# Patient Record
Sex: Female | Born: 1950 | Race: Black or African American | Hispanic: No | Marital: Single | Smoking: Former smoker
Health system: Southern US, Community
[De-identification: ages and names within clinical notes are randomized; demographics above are authoritative.]

## PROBLEM LIST (undated history)

## (undated) DIAGNOSIS — C801 Malignant (primary) neoplasm, unspecified: Secondary | ICD-10-CM

## (undated) DIAGNOSIS — I739 Peripheral vascular disease, unspecified: Secondary | ICD-10-CM

## (undated) DIAGNOSIS — B1920 Unspecified viral hepatitis C without hepatic coma: Secondary | ICD-10-CM

## (undated) DIAGNOSIS — F1910 Other psychoactive substance abuse, uncomplicated: Secondary | ICD-10-CM

## (undated) DIAGNOSIS — A159 Respiratory tuberculosis unspecified: Secondary | ICD-10-CM

## (undated) DIAGNOSIS — Z21 Asymptomatic human immunodeficiency virus [HIV] infection status: Secondary | ICD-10-CM

## (undated) DIAGNOSIS — B20 Human immunodeficiency virus [HIV] disease: Secondary | ICD-10-CM

## (undated) DIAGNOSIS — T783XXA Angioneurotic edema, initial encounter: Secondary | ICD-10-CM

## (undated) DIAGNOSIS — K7460 Unspecified cirrhosis of liver: Secondary | ICD-10-CM

## (undated) DIAGNOSIS — C50919 Malignant neoplasm of unspecified site of unspecified female breast: Secondary | ICD-10-CM

## (undated) HISTORY — DX: Angioneurotic edema, initial encounter: T78.3XXA

## (undated) HISTORY — DX: Malignant neoplasm of unspecified site of unspecified female breast: C50.919

## (undated) HISTORY — DX: Peripheral vascular disease, unspecified: I73.9

---

## 1998-04-22 ENCOUNTER — Emergency Department (HOSPITAL_COMMUNITY): Admission: EM | Admit: 1998-04-22 | Discharge: 1998-04-22 | Payer: Self-pay | Admitting: Emergency Medicine

## 1998-10-24 ENCOUNTER — Emergency Department (HOSPITAL_COMMUNITY): Admission: EM | Admit: 1998-10-24 | Discharge: 1998-10-24 | Payer: Self-pay

## 2000-01-19 ENCOUNTER — Emergency Department (HOSPITAL_COMMUNITY): Admission: EM | Admit: 2000-01-19 | Discharge: 2000-01-19 | Payer: Self-pay | Admitting: *Deleted

## 2000-06-27 ENCOUNTER — Encounter: Payer: Self-pay | Admitting: Emergency Medicine

## 2000-06-27 ENCOUNTER — Emergency Department (HOSPITAL_COMMUNITY): Admission: EM | Admit: 2000-06-27 | Discharge: 2000-06-27 | Payer: Self-pay | Admitting: Emergency Medicine

## 2000-07-24 ENCOUNTER — Encounter: Admission: RE | Admit: 2000-07-24 | Discharge: 2000-08-22 | Payer: Self-pay | Admitting: *Deleted

## 2001-12-22 ENCOUNTER — Emergency Department (HOSPITAL_COMMUNITY): Admission: EM | Admit: 2001-12-22 | Discharge: 2001-12-22 | Payer: Self-pay | Admitting: Emergency Medicine

## 2001-12-22 ENCOUNTER — Encounter: Payer: Self-pay | Admitting: Emergency Medicine

## 2002-03-19 ENCOUNTER — Encounter: Payer: Self-pay | Admitting: *Deleted

## 2002-03-19 ENCOUNTER — Emergency Department (HOSPITAL_COMMUNITY): Admission: EM | Admit: 2002-03-19 | Discharge: 2002-03-19 | Payer: Self-pay | Admitting: Emergency Medicine

## 2002-10-07 ENCOUNTER — Emergency Department (HOSPITAL_COMMUNITY): Admission: EM | Admit: 2002-10-07 | Discharge: 2002-10-08 | Payer: Self-pay

## 2002-10-08 ENCOUNTER — Encounter: Payer: Self-pay | Admitting: Emergency Medicine

## 2003-06-18 ENCOUNTER — Emergency Department (HOSPITAL_COMMUNITY): Admission: AD | Admit: 2003-06-18 | Discharge: 2003-06-18 | Payer: Self-pay | Admitting: Family Medicine

## 2003-10-08 ENCOUNTER — Emergency Department (HOSPITAL_COMMUNITY): Admission: EM | Admit: 2003-10-08 | Discharge: 2003-10-08 | Payer: Self-pay

## 2003-10-11 ENCOUNTER — Emergency Department (HOSPITAL_COMMUNITY): Admission: EM | Admit: 2003-10-11 | Discharge: 2003-10-11 | Payer: Self-pay | Admitting: Emergency Medicine

## 2003-10-15 ENCOUNTER — Ambulatory Visit (HOSPITAL_COMMUNITY): Admission: RE | Admit: 2003-10-15 | Discharge: 2003-10-15 | Payer: Self-pay | Admitting: Surgery

## 2004-08-18 ENCOUNTER — Emergency Department (HOSPITAL_COMMUNITY): Admission: EM | Admit: 2004-08-18 | Discharge: 2004-08-18 | Payer: Self-pay | Admitting: Emergency Medicine

## 2006-06-13 ENCOUNTER — Emergency Department (HOSPITAL_COMMUNITY): Admission: EM | Admit: 2006-06-13 | Discharge: 2006-06-13 | Payer: Self-pay | Admitting: Emergency Medicine

## 2006-06-21 ENCOUNTER — Emergency Department (HOSPITAL_COMMUNITY): Admission: EM | Admit: 2006-06-21 | Discharge: 2006-06-21 | Payer: Self-pay | Admitting: Emergency Medicine

## 2006-09-03 ENCOUNTER — Emergency Department (HOSPITAL_COMMUNITY): Admission: EM | Admit: 2006-09-03 | Discharge: 2006-09-03 | Payer: Self-pay | Admitting: Emergency Medicine

## 2006-10-03 ENCOUNTER — Emergency Department (HOSPITAL_COMMUNITY): Admission: EM | Admit: 2006-10-03 | Discharge: 2006-10-03 | Payer: Self-pay | Admitting: Emergency Medicine

## 2007-03-28 ENCOUNTER — Emergency Department (HOSPITAL_COMMUNITY): Admission: EM | Admit: 2007-03-28 | Discharge: 2007-03-29 | Payer: Self-pay | Admitting: Emergency Medicine

## 2007-07-08 ENCOUNTER — Emergency Department (HOSPITAL_COMMUNITY): Admission: EM | Admit: 2007-07-08 | Discharge: 2007-07-08 | Payer: Self-pay | Admitting: Family Medicine

## 2008-01-01 ENCOUNTER — Encounter: Admission: RE | Admit: 2008-01-01 | Discharge: 2008-01-01 | Payer: Self-pay | Admitting: Family Medicine

## 2008-12-13 ENCOUNTER — Encounter (INDEPENDENT_AMBULATORY_CARE_PROVIDER_SITE_OTHER): Payer: Self-pay | Admitting: Emergency Medicine

## 2008-12-13 ENCOUNTER — Emergency Department (HOSPITAL_COMMUNITY): Admission: EM | Admit: 2008-12-13 | Discharge: 2008-12-13 | Payer: Self-pay | Admitting: Emergency Medicine

## 2008-12-13 ENCOUNTER — Ambulatory Visit: Payer: Self-pay | Admitting: *Deleted

## 2009-02-06 ENCOUNTER — Emergency Department (HOSPITAL_COMMUNITY): Admission: EM | Admit: 2009-02-06 | Discharge: 2009-02-07 | Payer: Self-pay | Admitting: Emergency Medicine

## 2009-04-12 ENCOUNTER — Emergency Department (HOSPITAL_COMMUNITY): Admission: EM | Admit: 2009-04-12 | Discharge: 2009-04-12 | Payer: Self-pay | Admitting: Emergency Medicine

## 2010-09-29 LAB — URINE CULTURE: Colony Count: 50000

## 2010-09-29 LAB — URINALYSIS, ROUTINE W REFLEX MICROSCOPIC
Bilirubin Urine: NEGATIVE
Glucose, UA: NEGATIVE mg/dL
Hgb urine dipstick: NEGATIVE
Ketones, ur: NEGATIVE mg/dL
Nitrite: NEGATIVE
Specific Gravity, Urine: 1.017 (ref 1.005–1.030)
pH: 8 (ref 5.0–8.0)

## 2010-09-29 LAB — COMPREHENSIVE METABOLIC PANEL
BUN: 5 mg/dL — ABNORMAL LOW (ref 6–23)
CO2: 28 mEq/L (ref 19–32)
Chloride: 104 mEq/L (ref 96–112)
Creatinine, Ser: 0.58 mg/dL (ref 0.4–1.2)
GFR calc Af Amer: >60 mL/min (ref >60)
GFR calc non Af Amer: >60 mL/min (ref >60)
Glucose, Bld: 102 mg/dL — ABNORMAL HIGH (ref 70–99)
Potassium: 4.6 mEq/L (ref 3.5–5.1)
Total Bilirubin: 1.3 mg/dL — ABNORMAL HIGH (ref 0.3–1.2)

## 2010-09-29 LAB — DIFFERENTIAL
Eosinophils Relative: 0 % (ref 0–5)
Lymphocytes Relative: 24 % (ref 12–46)
Lymphs Abs: 1.1 10*3/uL (ref 0.7–4.0)
Monocytes Absolute: 0.6 10*3/uL (ref 0.1–1.0)
Monocytes Relative: 13 % — ABNORMAL HIGH (ref 3–12)
Neutro Abs: 2.8 10*3/uL (ref 1.7–7.7)
Neutrophils Relative %: 63 % (ref 43–77)

## 2010-09-29 LAB — CBC
MCHC: 34.3 g/dL (ref 30.0–36.0)
RBC: 4.22 MIL/uL (ref 3.87–5.11)
RDW: 13.6 % (ref 11.5–15.5)

## 2010-10-03 LAB — CBC
HCT: 42.6 % (ref 36.0–46.0)
Hemoglobin: 14.4 g/dL (ref 12.0–15.0)
MCHC: 33.9 g/dL (ref 30.0–36.0)
MCV: 99.2 fL (ref 78.0–100.0)
Platelets: 81 10*3/uL — ABNORMAL LOW (ref 150–400)
RBC: 4.3 MIL/uL (ref 3.87–5.11)
RDW: 13.2 % (ref 11.5–15.5)
WBC: 3.9 10*3/uL — ABNORMAL LOW (ref 4.0–10.5)

## 2010-10-03 LAB — DIFFERENTIAL
Basophils Absolute: 0 10*3/uL (ref 0.0–0.1)
Basophils Relative: 0 % (ref 0–1)
Eosinophils Absolute: 0 10*3/uL (ref 0.0–0.7)
Eosinophils Relative: 0 % (ref 0–5)
Lymphocytes Relative: 36 % (ref 12–46)
Lymphs Abs: 1.4 K/uL (ref 0.7–4.0)
Monocytes Absolute: 0.5 10*3/uL (ref 0.1–1.0)
Monocytes Relative: 12 % (ref 3–12)
Neutro Abs: 2 10*3/uL (ref 1.7–7.7)
Neutrophils Relative %: 51 % (ref 43–77)

## 2010-10-03 LAB — BASIC METABOLIC PANEL WITH GFR
CO2: 31 meq/L (ref 19–32)
Calcium: 8.9 mg/dL (ref 8.4–10.5)
Chloride: 104 meq/L (ref 96–112)
GFR calc Af Amer: >60 mL/min (ref >60)
GFR calc non Af Amer: >60 mL/min (ref >60)
Potassium: 4.5 meq/L (ref 3.5–5.1)
Sodium: 137 meq/L (ref 135–145)

## 2010-10-03 LAB — BASIC METABOLIC PANEL
BUN: 9 mg/dL (ref 6–23)
Creatinine, Ser: 0.68 mg/dL (ref 0.4–1.2)
Glucose, Bld: 100 mg/dL — ABNORMAL HIGH (ref 70–99)

## 2011-04-06 LAB — I-STAT 8, (EC8 V) (CONVERTED LAB)
Acid-Base Excess: 5 — ABNORMAL HIGH
Chloride: 102
Glucose, Bld: 140 — ABNORMAL HIGH
HCT: 50 — ABNORMAL HIGH
Hemoglobin: 17 — ABNORMAL HIGH
Operator id: 277751
pH, Ven: 7.405 — ABNORMAL HIGH

## 2011-04-06 LAB — HEPATIC FUNCTION PANEL
Albumin: 3.5
Alkaline Phosphatase: 92
Bilirubin, Direct: 0.6 — ABNORMAL HIGH
Indirect Bilirubin: 1.6 — ABNORMAL HIGH

## 2011-04-06 LAB — DIFFERENTIAL
Basophils Absolute: 0
Basophils Relative: 0
Lymphocytes Relative: 23
Monocytes Absolute: 0.9 — ABNORMAL HIGH
Neutrophils Relative %: 69

## 2011-04-06 LAB — POCT I-STAT CREATININE
Creatinine, Ser: 0.8
Operator id: 277751

## 2011-04-06 LAB — CBC
HCT: 44.7
MCV: 102.1 — ABNORMAL HIGH
RDW: 12.9
WBC: 11.4 — ABNORMAL HIGH

## 2011-04-06 LAB — CULTURE, BLOOD (ROUTINE X 2): Culture: NO GROWTH

## 2012-09-18 ENCOUNTER — Encounter (HOSPITAL_COMMUNITY): Payer: Self-pay | Admitting: Family Medicine

## 2012-09-18 ENCOUNTER — Emergency Department (HOSPITAL_COMMUNITY): Payer: Medicaid - Out of State

## 2012-09-18 ENCOUNTER — Inpatient Hospital Stay (HOSPITAL_COMMUNITY)
Admission: EM | Admit: 2012-09-18 | Discharge: 2012-10-09 | DRG: 329 | Disposition: A | Discharge status: Home / Self Care | Payer: Medicaid - Out of State | Attending: Internal Medicine | Admitting: Internal Medicine

## 2012-09-18 DIAGNOSIS — K648 Other hemorrhoids: Secondary | ICD-10-CM | POA: Diagnosis present

## 2012-09-18 DIAGNOSIS — D6959 Other secondary thrombocytopenia: Secondary | ICD-10-CM | POA: Diagnosis present

## 2012-09-18 DIAGNOSIS — K560 Paralytic ileus: Secondary | ICD-10-CM | POA: Diagnosis not present

## 2012-09-18 DIAGNOSIS — K5900 Constipation, unspecified: Secondary | ICD-10-CM | POA: Diagnosis not present

## 2012-09-18 DIAGNOSIS — R188 Other ascites: Secondary | ICD-10-CM | POA: Diagnosis present

## 2012-09-18 DIAGNOSIS — K859 Acute pancreatitis without necrosis or infection, unspecified: Secondary | ICD-10-CM | POA: Diagnosis present

## 2012-09-18 DIAGNOSIS — K6389 Other specified diseases of intestine: Secondary | ICD-10-CM | POA: Diagnosis present

## 2012-09-18 DIAGNOSIS — E46 Unspecified protein-calorie malnutrition: Secondary | ICD-10-CM | POA: Diagnosis present

## 2012-09-18 DIAGNOSIS — R161 Splenomegaly, not elsewhere classified: Secondary | ICD-10-CM | POA: Diagnosis present

## 2012-09-18 DIAGNOSIS — E871 Hypo-osmolality and hyponatremia: Secondary | ICD-10-CM | POA: Diagnosis present

## 2012-09-18 DIAGNOSIS — D62 Acute posthemorrhagic anemia: Secondary | ICD-10-CM | POA: Diagnosis not present

## 2012-09-18 DIAGNOSIS — Z6826 Body mass index (BMI) 26.0-26.9, adult: Secondary | ICD-10-CM

## 2012-09-18 DIAGNOSIS — Z5331 Laparoscopic surgical procedure converted to open procedure: Secondary | ICD-10-CM

## 2012-09-18 DIAGNOSIS — K861 Other chronic pancreatitis: Secondary | ICD-10-CM | POA: Diagnosis present

## 2012-09-18 DIAGNOSIS — B1920 Unspecified viral hepatitis C without hepatic coma: Secondary | ICD-10-CM | POA: Diagnosis present

## 2012-09-18 DIAGNOSIS — Z8719 Personal history of other diseases of the digestive system: Secondary | ICD-10-CM | POA: Diagnosis present

## 2012-09-18 DIAGNOSIS — K7460 Unspecified cirrhosis of liver: Secondary | ICD-10-CM | POA: Diagnosis present

## 2012-09-18 DIAGNOSIS — F17200 Nicotine dependence, unspecified, uncomplicated: Secondary | ICD-10-CM | POA: Diagnosis present

## 2012-09-18 DIAGNOSIS — K567 Ileus, unspecified: Secondary | ICD-10-CM | POA: Diagnosis not present

## 2012-09-18 DIAGNOSIS — D649 Anemia, unspecified: Secondary | ICD-10-CM | POA: Diagnosis present

## 2012-09-18 DIAGNOSIS — Z8611 Personal history of tuberculosis: Secondary | ICD-10-CM

## 2012-09-18 DIAGNOSIS — B20 Human immunodeficiency virus [HIV] disease: Secondary | ICD-10-CM | POA: Diagnosis present

## 2012-09-18 DIAGNOSIS — E8779 Other fluid overload: Secondary | ICD-10-CM | POA: Diagnosis present

## 2012-09-18 DIAGNOSIS — K929 Disease of digestive system, unspecified: Secondary | ICD-10-CM | POA: Diagnosis not present

## 2012-09-18 DIAGNOSIS — Z720 Tobacco use: Secondary | ICD-10-CM | POA: Diagnosis present

## 2012-09-18 DIAGNOSIS — K644 Residual hemorrhoidal skin tags: Secondary | ICD-10-CM | POA: Diagnosis present

## 2012-09-18 DIAGNOSIS — K9189 Other postprocedural complications and disorders of digestive system: Secondary | ICD-10-CM | POA: Diagnosis not present

## 2012-09-18 DIAGNOSIS — D638 Anemia in other chronic diseases classified elsewhere: Secondary | ICD-10-CM | POA: Diagnosis present

## 2012-09-18 DIAGNOSIS — C182 Malignant neoplasm of ascending colon: Principal | ICD-10-CM | POA: Diagnosis present

## 2012-09-18 DIAGNOSIS — R109 Unspecified abdominal pain: Secondary | ICD-10-CM | POA: Diagnosis present

## 2012-09-18 DIAGNOSIS — K766 Portal hypertension: Secondary | ICD-10-CM | POA: Diagnosis present

## 2012-09-18 DIAGNOSIS — Y832 Surgical operation with anastomosis, bypass or graft as the cause of abnormal reaction of the patient, or of later complication, without mention of misadventure at the time of the procedure: Secondary | ICD-10-CM | POA: Diagnosis not present

## 2012-09-18 HISTORY — DX: Unspecified viral hepatitis C without hepatic coma: B19.20

## 2012-09-18 HISTORY — DX: Asymptomatic human immunodeficiency virus (hiv) infection status: Z21

## 2012-09-18 HISTORY — DX: Human immunodeficiency virus (HIV) disease: B20

## 2012-09-18 MED ORDER — IOHEXOL 300 MG/ML  SOLN: 50.0000 mL | Freq: Once | INTRAMUSCULAR | Status: AC | PRN

## 2012-09-18 MED ORDER — MORPHINE SULFATE 4 MG/ML IJ SOLN
4.0000 mg | Freq: Once | INTRAMUSCULAR | Status: AC
  Administered 2012-09-18: 4 mg via INTRAVENOUS
  Filled 2012-09-18: qty 1

## 2012-09-18 MED ORDER — ONDANSETRON HCL 4 MG/2ML IJ SOLN
4.0000 mg | Freq: Once | INTRAMUSCULAR | Status: AC
  Administered 2012-09-18: 4 mg via INTRAVENOUS
  Filled 2012-09-18: qty 2

## 2012-09-18 MED ORDER — SODIUM CHLORIDE 0.9 % IV BOLUS (SEPSIS)
1000.0000 mL | Freq: Once | INTRAVENOUS | Status: AC
  Administered 2012-09-18: 1000 mL via INTRAVENOUS

## 2012-09-18 NOTE — ED Notes (Signed)
Patient states she has had RLQ pain radiating into right flank off and on for the past week. Reports nausea, vomiting and diarrhea. Denies dysuria. Patient reports abdominal "fullness."

## 2012-09-18 NOTE — ED Provider Notes (Signed)
History     CSN: [id number]  Arrival date & time 09/18/12  2244   First MD Initiated Contact with Patient 09/18/12 2302      Chief Complaint  Patient presents with  . Abdominal Pain    (Consider location/radiation/quality/duration/timing/severity/associated sxs/prior treatment) HPI Comments: Patient presents with right-sided abdominal pain has been intermittent for the past week. It is worse with food. It is associated with nausea, vomiting and diarrhea. She reports increased abdominal distention. No documented fevers. Denies any dysuria or hematuria. She has a history of HIV hepatitis C and does not have a doctor in the area. She states her counts are undetectable. Denies any vaginal bleeding or discharge. She states she had similar pain more than year ago with a negative workup in no diagnosis.  The history is provided by the patient.    Past Medical History  Diagnosis Date  . Hepatitis C   . HIV (human immunodeficiency virus infection)     History reviewed. No pertinent past surgical history.  Family History  Problem Relation Age of Onset  . Cancer Mother     History  Substance Use Topics  . Smoking status: Current Every Day Smoker -- 0.50 packs/day    Types: Cigarettes  . Smokeless tobacco: Not on file  . Alcohol Use: No    OB History   Grav Para Term Preterm Abortions TAB SAB Ect Mult Living                  Review of Systems  Constitutional: Positive for activity change, appetite change and fatigue. Negative for fever.  HENT: Negative for congestion and rhinorrhea.   Respiratory: Negative for cough, chest tightness and shortness of breath.   Cardiovascular: Negative for chest pain.  Gastrointestinal: Positive for nausea, vomiting, abdominal pain and diarrhea.  Genitourinary: Negative for dysuria, hematuria, vaginal bleeding and vaginal discharge.  Musculoskeletal: Negative for back pain.  Skin: Negative for rash.  Neurological: Negative for dizziness,  weakness and headaches.  A complete 10 system review of systems was obtained and all systems are negative except as noted in the HPI and PMH.    Allergies  Review of patient's allergies indicates no known allergies.  Home Medications  No current outpatient prescriptions on file.  BP 101/65  Pulse 74  Temp(Src) 98.9 F (37.2 C) (Oral)  Resp 16  Ht 5' 3.5" (1.613 m)  Wt 152 lb 1.9 oz (69 kg)  BMI 26.52 kg/m2  SpO2 96%  Physical Exam  Constitutional: She is oriented to person, place, and time. She appears well-developed and well-nourished. No distress.  HENT:  Head: Normocephalic and atraumatic.  Mouth/Throat: Oropharynx is clear and moist. No oropharyngeal exudate.  Eyes: Conjunctivae and EOM are normal. Pupils are equal, round, and reactive to light.  Neck: Normal range of motion. Neck supple.  Cardiovascular: Normal rate, regular rhythm and normal heart sounds.   No murmur heard. Pulmonary/Chest: Effort normal and breath sounds normal. No respiratory distress.  Abdominal: Soft. Bowel sounds are normal. She exhibits distension. There is tenderness. There is guarding.  Hepatomegaly with tenderness in the right upper quadrant right lower quadrant with guarding. Patient pushes my hand away with exam  Musculoskeletal: Normal range of motion. She exhibits no edema and no tenderness.  No CVA tenderness  Neurological: She is alert and oriented to person, place, and time. No cranial nerve deficit. She exhibits normal muscle tone. Coordination normal.  Skin: Skin is warm.    ED Course  Procedures (including critical  care time)  Labs Reviewed  CBC WITH DIFFERENTIAL - Abnormal; Notable for the following:    WBC 3.6 (*)    RBC 3.82 (*)    Hemoglobin 9.5 (*)    HCT 30.3 (*)    MCH 24.9 (*)    Platelets 139 (*)    All other components within normal limits  COMPREHENSIVE METABOLIC PANEL - Abnormal; Notable for the following:    Glucose, Bld 113 (*)    Albumin 2.3 (*)    All other  components within normal limits  LIPASE, BLOOD - Abnormal; Notable for the following:    Lipase 99 (*)    All other components within normal limits  LACTIC ACID, PLASMA  URINALYSIS, ROUTINE W REFLEX MICROSCOPIC  PROTIME-INR  LIPID PANEL  BASIC METABOLIC PANEL  HEPATIC FUNCTION PANEL  CBC WITH DIFFERENTIAL  T-HELPER CELLS (CD4) COUNT  HIV 1 RNA QUANT-NO REFLEX-BLD  LIPASE, BLOOD  VITAMIN B12  FOLATE  IRON AND TIBC  FERRITIN  RETICULOCYTES  TYPE AND SCREEN   US Abdomen Complete  09/19/2012  *RADIOLOGY REPORT*  Clinical Data:  Right upper quadrant pain.  LIMITED ABDOMINAL ULTRASOUND - RIGHT UPPER QUADRANT  Comparison:  CT today.  Findings:  Gallbladder:  There are no gallstones.  No sonographic Murphy's sign.  Gallbladder wall is mildly thickened at 3.7 mm, with normal below the 3 mm.  This is nonspecific in a patient with HIV and could be due to either the patient's history of hepatitis or HIV.  Common bile duct:  Upper limits of normal for age, 6.5 mm.  Mild intrahepatic biliary ductal dilation is also demonstrated on CT.  Liver:  Cirrhotic.  Small amount of perihepatic ascites.  IMPRESSION: Mild gallbladder wall thickening which is nonspecific in this cirrhotic patient.  This could also be associated with HIV cholangiography.  No gallstones or common duct stone identified. Significantly, no sonographic Murphy's sign.                    Original Report Authenticated By: Andreas Newport, M.D.    Ct Abdomen Pelvis W Contrast  09/19/2012  *RADIOLOGY REPORT*  Clinical Data: Abdominal pain.  Right lower quadrant pain.  Pain radiating to the right flank.  History of HIV and hepatitis C.  CT ABDOMEN AND PELVIS WITH CONTRAST  Technique:  Multidetector CT imaging of the abdomen and pelvis was performed following the standard protocol during bolus administration of intravenous contrast.  Contrast: OMNIPAQUE IOHEXOL 300 MG/ML  SOLN  Comparison: None.  Findings: Lung Bases: Dependent atelectasis.   Linear scarring in the right middle lobe adjacent to the heart.  Liver:  Hepatic cirrhosis.  Diffuse micronodular contour.  No focal mass lesion is identified.  Mild intrahepatic biliary ductal dilation.  Spleen:  Splenomegaly compatible with portal venous hypertension.  Gallbladder:  Grossly normal.  No calcified stones.  Common bile duct:  Normal for age.  Pancreas:  Mild prominence of pancreatic duct.  No focal mass lesions.  The pancreas appears full.  Adrenal glands:  Normal.  Kidneys:  Normal enhancement and excretion.  The ureters appear within normal limits.  Sub centimeter left inferior pole renal cyst.  Stomach:  Decompressed.  Numerous upper abdominal lymph nodes are present, which may be associated with HIV, congestive or reactive. Neoplastic nodes are a consideration.  Index node along the lesser curvature of the stomach measures 14 mm x 33 mm.  Small bowel:  No small bowel obstruction.  No definite mural thickening.  Colon:  Ileocecal valve appears normal.  The appendix is not identified.  Abnormal soft tissue is present in the right pericolic gutter.  There is circumferential mural thickening in the proximal ascending colon, with an element of obstruction likely.  This enhancing mass demonstrates several areas of low attenuation. Differential considerations include neoplasm such as primary colon cancer, lymphoma or colitis.  This is focal and restricted to the ascending colon.  There is no involvement of the transverse or descending colon.  Pelvic Genitourinary:  Grossly within normal limits.  Urinary bladder normal.  Portal venous collaterals and prominent hemorrhoidal vein.  Bones:  No AVN.  No aggressive osseous lesions.  Spondylosis.  Vasculature: Portal venous hypertension with collaterals in the upper abdomen.  Retroperitoneal collaterals are present as well. Recanalization of the periumbilical vein. Atherosclerosis of the aorta and iliac vessels.  IMPRESSION: 1.  Hepatic cirrhosis with a small  amount of ascites, splenomegaly and portal venous hypertension. 2.  Mass in the ascending colon is suspicious for neoplasm.  Focal colitis considered less likely.  In this patient with HIV, colonic lymphoma is a definite consideration.  Adjacent soft tissue stranding extending into the right pericolic gutter raises the possibility of peritoneal spread of disease. 3.  Fullness of the pancreas with prominence of pancreatic duct. Findings suggestive of pancreatitis. 4.  Abdominal lymphadenopathy is nonspecific reactive, congestive, associated with HIV, or neoplastic in this patient with colonic mass.  Critical Value/emergent results were called by telephone at the time of interpretation on 09/19/2012 at 0141 hours to Dr. Manus Gunning, Jeannett Senior, who verbally acknowledged these results. We discussed the ultrasound dated been ordered but would likely add little based on the CT findings.   Original Report Authenticated By: Andreas Newport, M.D.      1. Pancreatitis   2. Colonic mass   3. HIV (human immunodeficiency virus infection)   4. Abdominal pain   5. Anemia       MDM  History of HIV and hepatitis with undetectable viral load by her report presenting with right-sided abdominal pain that has been intermittent for the past week. Alternating with constipation and diarrhea. Associated with nausea and vomiting. No fever.  Exquisite tenderness to palpation in the right side of her abdomen worse in the right flank and right lower quadrant.  Drop in hemoglobin noted. Patient denies any blood in stool. Guaiac is negative. No previous studies for the past 4 years. CT scan shows possible colonic mass as well as pancreatitis, cirrhosis, ascites and portal venous hypertension.  Will admit for further workup including endoscopy. Colon mass could be responsible for anemia.       Glynn Octave, MD 09/19/12 [phone]

## 2012-09-19 ENCOUNTER — Emergency Department (HOSPITAL_COMMUNITY): Payer: Medicaid - Out of State

## 2012-09-19 ENCOUNTER — Encounter (HOSPITAL_COMMUNITY): Payer: Self-pay

## 2012-09-19 DIAGNOSIS — R109 Unspecified abdominal pain: Secondary | ICD-10-CM

## 2012-09-19 DIAGNOSIS — B1920 Unspecified viral hepatitis C without hepatic coma: Secondary | ICD-10-CM | POA: Diagnosis present

## 2012-09-19 DIAGNOSIS — B20 Human immunodeficiency virus [HIV] disease: Secondary | ICD-10-CM | POA: Diagnosis present

## 2012-09-19 DIAGNOSIS — D649 Anemia, unspecified: Secondary | ICD-10-CM

## 2012-09-19 DIAGNOSIS — Z8719 Personal history of other diseases of the digestive system: Secondary | ICD-10-CM | POA: Diagnosis present

## 2012-09-19 DIAGNOSIS — K859 Acute pancreatitis without necrosis or infection, unspecified: Secondary | ICD-10-CM

## 2012-09-19 DIAGNOSIS — Z720 Tobacco use: Secondary | ICD-10-CM | POA: Diagnosis present

## 2012-09-19 DIAGNOSIS — K6389 Other specified diseases of intestine: Secondary | ICD-10-CM

## 2012-09-19 LAB — COMPREHENSIVE METABOLIC PANEL
AST: 37 U/L (ref 0–37)
CO2: 25 mEq/L (ref 19–32)
Calcium: 8.5 mg/dL (ref 8.4–10.5)
Creatinine, Ser: 0.5 mg/dL (ref 0.50–1.10)
GFR calc Af Amer: >90 mL/min (ref >90)
GFR calc non Af Amer: >90 mL/min (ref >90)

## 2012-09-19 LAB — HEPATIC FUNCTION PANEL
Albumin: 2 g/dL — ABNORMAL LOW (ref 3.5–5.2)
Alkaline Phosphatase: 83 U/L (ref 39–117)
Total Bilirubin: 0.3 mg/dL (ref 0.3–1.2)

## 2012-09-19 LAB — CBC WITH DIFFERENTIAL/PLATELET
Basophils Absolute: 0 10*3/uL (ref 0.0–0.1)
Basophils Absolute: 0 10*3/uL (ref 0.0–0.1)
Basophils Relative: 0 % (ref 0–1)
Eosinophils Absolute: 0 10*3/uL (ref 0.0–0.7)
Eosinophils Absolute: 0 10*3/uL (ref 0.0–0.7)
Eosinophils Relative: 0 % (ref 0–5)
Eosinophils Relative: 0 % (ref 0–5)
HCT: 30.3 % — ABNORMAL LOW (ref 36.0–46.0)
Lymphocytes Relative: 20 % (ref 12–46)
Lymphocytes Relative: 34 % (ref 12–46)
MCH: 24.9 pg — ABNORMAL LOW (ref 26.0–34.0)
MCHC: 30.8 g/dL (ref 30.0–36.0)
MCHC: 31.4 g/dL (ref 30.0–36.0)
MCV: 79.3 fL (ref 78.0–100.0)
MCV: 79.5 fL (ref 78.0–100.0)
Monocytes Absolute: 0.4 10*3/uL (ref 0.1–1.0)
Platelets: 123 10*3/uL — ABNORMAL LOW (ref 150–400)
RDW: 15.1 % (ref 11.5–15.5)
RDW: 15.3 % (ref 11.5–15.5)
WBC: 2.7 10*3/uL — ABNORMAL LOW (ref 4.0–10.5)
WBC: 3.6 10*3/uL — ABNORMAL LOW (ref 4.0–10.5)

## 2012-09-19 LAB — URINALYSIS, ROUTINE W REFLEX MICROSCOPIC
Ketones, ur: NEGATIVE mg/dL
Leukocytes, UA: NEGATIVE
Nitrite: NEGATIVE
Protein, ur: NEGATIVE mg/dL

## 2012-09-19 LAB — IRON AND TIBC
Iron: 14 ug/dL — ABNORMAL LOW (ref 42–135)
TIBC: 342 ug/dL (ref 250–470)

## 2012-09-19 LAB — BASIC METABOLIC PANEL
CO2: 27 mEq/L (ref 19–32)
Calcium: 7.8 mg/dL — ABNORMAL LOW (ref 8.4–10.5)
Creatinine, Ser: 0.5 mg/dL (ref 0.50–1.10)
Glucose, Bld: 93 mg/dL (ref 70–99)
Sodium: 134 mEq/L — ABNORMAL LOW (ref 135–145)

## 2012-09-19 LAB — LACTIC ACID, PLASMA: Lactic Acid, Venous: 1.7 mmol/L (ref 0.5–2.2)

## 2012-09-19 LAB — T-HELPER CELLS (CD4) COUNT (NOT AT ARMC): CD4 T Cell Abs: 150 uL — ABNORMAL LOW (ref 400–2700)

## 2012-09-19 LAB — TYPE AND SCREEN

## 2012-09-19 LAB — VITAMIN B12: Vitamin B-12: 1100 pg/mL — ABNORMAL HIGH (ref 211–911)

## 2012-09-19 LAB — LIPID PANEL
HDL: 14 mg/dL — ABNORMAL LOW (ref >39)
Triglycerides: 32 mg/dL (ref <150)

## 2012-09-19 LAB — FOLATE: Folate: 15.1 ng/mL

## 2012-09-19 LAB — RETICULOCYTES
RBC.: 3.31 MIL/uL — ABNORMAL LOW (ref 3.87–5.11)
Retic Count, Absolute: 43 10*3/uL (ref 19.0–186.0)

## 2012-09-19 MED ORDER — METRONIDAZOLE 500 MG PO TABS
500.0000 mg | ORAL_TABLET | Freq: Three times a day (TID) | ORAL | Status: DC
  Administered 2012-09-19 – 2012-09-20 (×3): 500 mg via ORAL
  Filled 2012-09-19 (×6): qty 1

## 2012-09-19 MED ORDER — HYDROMORPHONE HCL PF 1 MG/ML IJ SOLN
1.0000 mg | INTRAMUSCULAR | Status: DC | PRN
  Administered 2012-09-19 (×3): 1 mg via INTRAVENOUS
  Filled 2012-09-19 (×3): qty 1

## 2012-09-19 MED ORDER — CLONAZEPAM 0.5 MG PO TABS
0.5000 mg | ORAL_TABLET | Freq: Two times a day (BID) | ORAL | Status: DC
  Administered 2012-09-19 – 2012-09-22 (×7): 0.5 mg via ORAL
  Filled 2012-09-19 (×7): qty 1

## 2012-09-19 MED ORDER — SULFAMETHOXAZOLE-TRIMETHOPRIM 400-80 MG PO TABS
1.0000 | ORAL_TABLET | Freq: Every day | ORAL | Status: DC
  Administered 2012-09-19 – 2012-10-09 (×20): 1 via ORAL
  Filled 2012-09-19 (×22): qty 1

## 2012-09-19 MED ORDER — PANTOPRAZOLE SODIUM 40 MG IV SOLR
40.0000 mg | INTRAVENOUS | Status: DC
  Administered 2012-09-19 – 2012-09-29 (×11): 40 mg via INTRAVENOUS
  Filled 2012-09-19 (×16): qty 40

## 2012-09-19 MED ORDER — ACETAMINOPHEN 650 MG RE SUPP: 650.0000 mg | Freq: Four times a day (QID) | RECTAL | Status: DC | PRN

## 2012-09-19 MED ORDER — HYDROMORPHONE HCL PF 1 MG/ML IJ SOLN
1.0000 mg | Freq: Once | INTRAMUSCULAR | Status: AC
  Administered 2012-09-19: 1 mg via INTRAVENOUS
  Filled 2012-09-19: qty 1

## 2012-09-19 MED ORDER — HYDROMORPHONE HCL PF 2 MG/ML IJ SOLN
2.0000 mg | INTRAMUSCULAR | Status: DC | PRN
  Administered 2012-09-19 – 2012-09-23 (×25): 2 mg via INTRAVENOUS
  Filled 2012-09-19 (×25): qty 1

## 2012-09-19 MED ORDER — HYDROMORPHONE HCL PF 1 MG/ML IJ SOLN
0.5000 mg | INTRAMUSCULAR | Status: DC | PRN
  Administered 2012-09-19: 0.5 mg via INTRAVENOUS
  Filled 2012-09-19: qty 1

## 2012-09-19 MED ORDER — MORPHINE SULFATE ER 15 MG PO TBCR
15.0000 mg | EXTENDED_RELEASE_TABLET | Freq: Two times a day (BID) | ORAL | Status: DC
  Administered 2012-09-19 – 2012-09-22 (×7): 15 mg via ORAL
  Filled 2012-09-19 (×7): qty 1

## 2012-09-19 MED ORDER — SODIUM CHLORIDE 0.9 % IV SOLN
INTRAVENOUS | Status: DC
  Administered 2012-09-19 – 2012-09-20 (×3): via INTRAVENOUS

## 2012-09-19 MED ORDER — DEXTROSE 5 % IV SOLN
1.0000 g | Freq: Four times a day (QID) | INTRAVENOUS | Status: DC
  Administered 2012-09-19 – 2012-09-20 (×5): 1 g via INTRAVENOUS
  Filled 2012-09-19 (×7): qty 1

## 2012-09-19 MED ORDER — MORPHINE SULFATE 15 MG PO TABS: 15.0000 mg | ORAL_TABLET | Freq: Two times a day (BID) | ORAL | Status: DC

## 2012-09-19 MED ORDER — DEXTROSE 5 % IV SOLN
1.0000 g | INTRAVENOUS | Status: DC
  Administered 2012-09-19: 1 g via INTRAVENOUS
  Filled 2012-09-19: qty 10

## 2012-09-19 MED ORDER — ACETAMINOPHEN 325 MG PO TABS: 650.0000 mg | ORAL_TABLET | Freq: Four times a day (QID) | ORAL | Status: DC | PRN

## 2012-09-19 MED ORDER — PEG 3350-KCL-NA BICARB-NACL 420 G PO SOLR
4000.0000 mL | Freq: Once | ORAL | Status: AC
  Administered 2012-09-19: 4000 mL via ORAL
  Filled 2012-09-19: qty 4000

## 2012-09-19 MED ORDER — ONDANSETRON HCL 4 MG/2ML IJ SOLN
4.0000 mg | Freq: Three times a day (TID) | INTRAMUSCULAR | Status: DC | PRN
  Administered 2012-09-19: 4 mg via INTRAVENOUS
  Filled 2012-09-19: qty 2

## 2012-09-19 MED ORDER — ONDANSETRON HCL 4 MG PO TABS: 4.0000 mg | ORAL_TABLET | Freq: Four times a day (QID) | ORAL | Status: DC | PRN

## 2012-09-19 MED ORDER — CIPROFLOXACIN IN D5W 400 MG/200ML IV SOLN
400.0000 mg | Freq: Two times a day (BID) | INTRAVENOUS | Status: DC
  Filled 2012-09-19: qty 200

## 2012-09-19 MED ORDER — ONDANSETRON HCL 4 MG/2ML IJ SOLN
4.0000 mg | Freq: Four times a day (QID) | INTRAMUSCULAR | Status: DC | PRN
  Administered 2012-09-24 – 2012-10-06 (×3): 4 mg via INTRAVENOUS
  Filled 2012-09-19 (×3): qty 2

## 2012-09-19 MED ORDER — IOHEXOL 300 MG/ML  SOLN
100.0000 mL | Freq: Once | INTRAMUSCULAR | Status: AC | PRN
  Administered 2012-09-19: 100 mL via INTRAVENOUS

## 2012-09-19 MED ORDER — ELVITEG-COBIC-EMTRICIT-TENOFDF 150-150-200-300 MG PO TABS
1.0000 | ORAL_TABLET | Freq: Every day | ORAL | Status: DC
  Administered 2012-09-19 – 2012-09-23 (×5): 1 via ORAL
  Filled 2012-09-19 (×6): qty 1

## 2012-09-19 MED ORDER — SODIUM CHLORIDE 0.9 % IV SOLN
INTRAVENOUS | Status: DC
  Administered 2012-09-19: 03:00:00 via INTRAVENOUS

## 2012-09-19 NOTE — H&P (Addendum)
[facility] History and Physical  Ruth Stephenson WUJ:[id number] DOB: 19-Dec-1950 DOA: 09/18/2012  Referring physician: Dr.Rancour. PCP: No primary provider on file.  Specialists: None.  Chief Complaint: Abdominal pain.  HPI: Ruth Stephenson is a 62 y.o. female history of HIV and hepatitis C has been experiencing right flank pain nausea and vomiting over the last 2 weeks. Her symptoms has been gradually worsening. Patient has been having diarrhea alternating with constipation for last few months. Denies any fever chills. The pain is mostly on the right flank area nonradiating. In the ER labs show that patient has elevated lipase and CT abdomen and pelvis show possibility of pancreatitis and ascending colon mass. Patient at this time is being admitted for further management. Patient states that she has been combine with her HIV medications and she just recently moved from the ER 2 months ago. Patient states that her last HIV viral load was undetectable.  Review of Systems: As presented in the history of presenting illness, rest negative.  Past Medical History  Diagnosis Date  . Hepatitis C   . HIV (human immunodeficiency virus infection)    History reviewed. No pertinent past surgical history. Social History:  reports that she has been smoking Cigarettes.  She has been smoking about 0.50 packs per day. She does not have any smokeless tobacco history on file. She reports that she does not drink alcohol or use illicit drugs. Lives at home. where does patient live-- Can do ADLs. Can patient participate in ADLs?  No Known Allergies  Family History  Problem Relation Age of Onset  . Cancer Mother       Prior to Admission medications   Medication Sig Start Date End Date Taking? Authorizing Provider  elvitegravir-cobicistat-emtricitabine-tenofovir (STRIBILD) 150-150-200-300 MG TABS Take 1 tablet by mouth daily with breakfast.   Yes Historical Provider, MD   Physical Exam: Filed  Vitals:   09/18/12 2257  BP: 111/81  Pulse: 86  Temp: 99.8 F (37.7 C)  TempSrc: Oral  SpO2: 99%     General:  Well-developed well-nourished.  Eyes: Anicteric no pallor.  ENT: No discharge from ears eyes nose and mouth.  Neck: No mass felt.  Cardiovascular: S1-S2 heard.  Respiratory: No rhonchi or crepitations.  Abdomen: Mildly distended. Nontender (patient had received pain medications). No guarding no rigidity.  Skin: No rash.  Musculoskeletal: No edema.  Psychiatric: Appears normal.  Neurologic: Alert awake oriented to time place and person. Moves all extremities.  Labs on Admission:  Basic Metabolic Panel:  Recent Labs Lab 09/18/12 2335  NA 135  K 4.2  CL 102  CO2 25  GLUCOSE 113*  BUN 8  CREATININE 0.50  CALCIUM 8.5   Liver Function Tests:  Recent Labs Lab 09/18/12 2335  AST 37  ALT 17  ALKPHOS 106  BILITOT 0.3  PROT 7.3  ALBUMIN 2.3*    Recent Labs Lab 09/18/12 2335  LIPASE 99*   No results found for this basename: AMMONIA,  in the last 168 hours CBC:  Recent Labs Lab 09/18/12 2335  WBC 3.6*  NEUTROABS 2.5  HGB 9.5*  HCT 30.3*  MCV 79.3  PLT 139*   Cardiac Enzymes: No results found for this basename: CKTOTAL, CKMB, CKMBINDEX, TROPONINI,  in the last 168 hours  BNP (last 3 results) No results found for this basename: PROBNP,  in the last 8760 hours CBG: No results found for this basename: GLUCAP,  in the last 168 hours  Radiological Exams on Admission: US Abdomen  Complete  09/19/2012  *RADIOLOGY REPORT*  Clinical Data:  Right upper quadrant pain.  LIMITED ABDOMINAL ULTRASOUND - RIGHT UPPER QUADRANT  Comparison:  CT today.  Findings:  Gallbladder:  There are no gallstones.  No sonographic Murphy's sign.  Gallbladder wall is mildly thickened at 3.7 mm, with normal below the 3 mm.  This is nonspecific in a patient with HIV and could be due to either the patient's history of hepatitis or HIV.  Common bile duct:  Upper limits of  normal for age, 6.5 mm.  Mild intrahepatic biliary ductal dilation is also demonstrated on CT.  Liver:  Cirrhotic.  Small amount of perihepatic ascites.  IMPRESSION: Mild gallbladder wall thickening which is nonspecific in this cirrhotic patient.  This could also be associated with HIV cholangiography.  No gallstones or common duct stone identified. Significantly, no sonographic Murphy's sign.                    Original Report Authenticated By: Andreas Newport, M.D.    Ct Abdomen Pelvis W Contrast  09/19/2012  *RADIOLOGY REPORT*  Clinical Data: Abdominal pain.  Right lower quadrant pain.  Pain radiating to the right flank.  History of HIV and hepatitis C.  CT ABDOMEN AND PELVIS WITH CONTRAST  Technique:  Multidetector CT imaging of the abdomen and pelvis was performed following the standard protocol during bolus administration of intravenous contrast.  Contrast: OMNIPAQUE IOHEXOL 300 MG/ML  SOLN  Comparison: None.  Findings: Lung Bases: Dependent atelectasis.  Linear scarring in the right middle lobe adjacent to the heart.  Liver:  Hepatic cirrhosis.  Diffuse micronodular contour.  No focal mass lesion is identified.  Mild intrahepatic biliary ductal dilation.  Spleen:  Splenomegaly compatible with portal venous hypertension.  Gallbladder:  Grossly normal.  No calcified stones.  Common bile duct:  Normal for age.  Pancreas:  Mild prominence of pancreatic duct.  No focal mass lesions.  The pancreas appears full.  Adrenal glands:  Normal.  Kidneys:  Normal enhancement and excretion.  The ureters appear within normal limits.  Sub centimeter left inferior pole renal cyst.  Stomach:  Decompressed.  Numerous upper abdominal lymph nodes are present, which may be associated with HIV, congestive or reactive. Neoplastic nodes are a consideration.  Index node along the lesser curvature of the stomach measures 14 mm x 33 mm.  Small bowel:  No small bowel obstruction.  No definite mural thickening.  Colon:   Ileocecal  valve appears normal.  The appendix is not identified.  Abnormal soft tissue is present in the right pericolic gutter.  There is circumferential mural thickening in the proximal ascending colon, with an element of obstruction likely.  This enhancing mass demonstrates several areas of low attenuation. Differential considerations include neoplasm such as primary colon cancer, lymphoma or colitis.  This is focal and restricted to the ascending colon.  There is no involvement of the transverse or descending colon.  Pelvic Genitourinary:  Grossly within normal limits.  Urinary bladder normal.  Portal venous collaterals and prominent hemorrhoidal vein.  Bones:  No AVN.  No aggressive osseous lesions.  Spondylosis.  Vasculature: Portal venous hypertension with collaterals in the upper abdomen.  Retroperitoneal collaterals are present as well. Recanalization of the periumbilical vein. Atherosclerosis of the aorta and iliac vessels.  IMPRESSION: 1.  Hepatic cirrhosis with a small amount of ascites, splenomegaly and portal venous hypertension. 2.  Mass in the ascending colon is suspicious for neoplasm.  Focal colitis considered less  likely.  In this patient with HIV, colonic lymphoma is a definite consideration.  Adjacent soft tissue stranding extending into the right pericolic gutter raises the possibility of peritoneal spread of disease. 3.  Fullness of the pancreas with prominence of pancreatic duct. Findings suggestive of pancreatitis. 4.  Abdominal lymphadenopathy is nonspecific reactive, congestive, associated with HIV, or neoplastic in this patient with colonic mass.  Critical Value/emergent results were called by telephone at the time of interpretation on 09/19/2012 at 0141 hours to Dr. Manus Gunning, Jeannett Senior, who verbally acknowledged these results. We discussed the ultrasound dated been ordered but would likely add little based on the CT findings.   Original Report Authenticated By: Andreas Newport, M.D.      Assessment/Plan Principal Problem:   Abdominal pain Active Problems:   Pancreatitis   Colonic mass   HIV (human immunodeficiency virus infection)   Hepatitis C   Tobacco abuse   Anemia   1. Abdominal pain could be from pancreatitis - patient's states that she rarely drinks alcohol. She has had pancreatitis last year and was admitted. At this time check lipid panel. CT does not show any gallstones. Continue clear liquid diet and gentle hydration. But her pain is mostly on the right flank area where patient's CT shows ascending colon mass. 2. Ascending colon mass - consult GI in a.m. for further management. 3. HIV - check viral load and CD4 count. Continue present medications. Will need infectious disease referral. 4. History of hepatitis C - probably has cirrhosis presently per the CAT scan. Consult GI. Will place patient on empiric antibiotics. 5. Anemia - stool for occult blood done by the ER physician was negative. Patient's last hemoglobin in the chart was more than 4 years ago which was normal. Patient states that she didn't see any obvious GI bleed or black stools. She has been taking pain relief medications over the last few days which includes salicylates. At this time recheck CBC and check anemia panel. Type and screen. Protonix IV. 6. Tobacco abuse - advised to quit smoking.    Code Status: Full code.  Family Communication: None.  Disposition Plan: Admit to inpatient.    Tyneshia Stivers N. [facility] Pager [phone].  If 7PM-7AM, please contact night-coverage [email or web address] Password [facility] 09/19/2012, 3:38 AM

## 2012-09-19 NOTE — Care Management Note (Signed)
CARE MANAGEMENT NOTE 09/19/2012  Patient:  Ruth Stephenson, Ruth Stephenson   Account Number:  [id number]  Date Initiated:  09/19/2012  Documentation initiated by:  Dmya Long  Subjective/Objective Assessment:   62 yo femlae admitted with pancreatitis. Hx of HIV. PTA pt independent     Action/Plan:   Home when stable   Anticipated DC Date:     Anticipated DC Plan:  HOME/SELF CARE      DC Planning Services  CM consult      Choice offered to / List presented to:          [facility] arranged  NA      HH agency  NA   Status of service:  In process, will continue to follow Medicare Important Message given?   (If response is "NO", the following Medicare IM given date fields will be blank) Date Medicare IM given:   Date Additional Medicare IM given:    Discharge Disposition:    Per UR Regulation:  Reviewed for med. necessity/level of care/duration of stay  If discussed at Long Length of Stay Meetings, dates discussed:    Comments:  09/19/12 1300 Eryck Negron,RN,BSN [phone]

## 2012-09-19 NOTE — Consult Note (Signed)
Reason for Consult: Ascending colon mass Referring Physician: [facility]  [name] HPI: This is a 62 year old female with a PMH of HIV and HCV who is admitted to the hospital for recurrent right lower quadrant pain.  Her pain started back a couple of weeks ago and it progressively worsened.  She was evaluated for the same type of pain last year at [facility] in [place], per her report.  A colonoscopy was attempted, but she was not able to have a complete examination.  Ultimately her pain resolved, for the most part, but now she has a severe recurrence.  A CT scan was performed and it revealed an ascending colon mass.  She has a history of HIV and she has been out of her medications for one month.  Per her report, her viral load was undetectable, but she does not know her CD4 count.  Additionally, the CT scan, revealed a micronodular liver as well as splenomegaly.  Past Medical History  Diagnosis Date  . Hepatitis C   . HIV (human immunodeficiency virus infection)     History reviewed. No pertinent past surgical history.  Family History  Problem Relation Age of Onset  . Cancer Mother     Social History:  reports that she has been smoking Cigarettes.  She has been smoking about 0.50 packs per day. She does not have any smokeless tobacco history on file. She reports that she does not drink alcohol or use illicit drugs.  Allergies: No Known Allergies  Medications:  Scheduled: . cefoTAXime (CLAFORAN) IV  1 g Intravenous Q6H  . clonazePAM  0.5 mg Oral BID  . elvitegravir-cobicistat-emtricitabine-tenofovir  1 tablet Oral Q breakfast  . metroNIDAZOLE  500 mg Oral Q8H  . morphine  15 mg Oral Q12H  . pantoprazole (PROTONIX) IV  40 mg Intravenous Q24H  . polyethylene glycol-electrolytes  4,000 mL Oral Once  . sulfamethoxazole-trimethoprim  1 tablet Oral Daily   Continuous: . sodium chloride 75 mL/hr at 09/19/12 0553    Results for orders placed during the hospital  encounter of 09/18/12 (from the past 24 hour(s))  CBC WITH DIFFERENTIAL     Status: Abnormal   Collection Time    09/18/12 11:35 PM      Result Value Range   WBC 3.6 (*) 4.0 - 10.5 K/uL   RBC 3.82 (*) 3.87 - 5.11 MIL/uL   Hemoglobin 9.5 (*) 12.0 - 15.0 g/dL   HCT 16.1 (*) 09.6 - 04.5 %   MCV 79.3  78.0 - 100.0 fL   MCH 24.9 (*) 26.0 - 34.0 pg   MCHC 31.4  30.0 - 36.0 g/dL   RDW 40.9  81.1 - 91.4 %   Platelets 139 (*) 150 - 400 K/uL   Neutrophils Relative 69  43 - 77 %   Neutro Abs 2.5  1.7 - 7.7 K/uL   Lymphocytes Relative 20  12 - 46 %   Lymphs Abs 0.7  0.7 - 4.0 K/uL   Monocytes Relative 10  3 - 12 %   Monocytes Absolute 0.4  0.1 - 1.0 K/uL   Eosinophils Relative 0  0 - 5 %   Eosinophils Absolute 0.0  0.0 - 0.7 K/uL   Basophils Relative 0  0 - 1 %   Basophils Absolute 0.0  0.0 - 0.1 K/uL  COMPREHENSIVE METABOLIC PANEL     Status: Abnormal   Collection Time    09/18/12 11:35 PM      Result  Value Range   Sodium 135  135 - 145 mEq/L   Potassium 4.2  3.5 - 5.1 mEq/L   Chloride 102  96 - 112 mEq/L   CO2 25  19 - 32 mEq/L   Glucose, Bld 113 (*) 70 - 99 mg/dL   BUN 8  6 - 23 mg/dL   Creatinine, Ser 4.09  0.50 - 1.10 mg/dL   Calcium 8.5  8.4 - 81.1 mg/dL   Total Protein 7.3  6.0 - 8.3 g/dL   Albumin 2.3 (*) 3.5 - 5.2 g/dL   AST 37  0 - 37 U/L   ALT 17  0 - 35 U/L   Alkaline Phosphatase 106  39 - 117 U/L   Total Bilirubin 0.3  0.3 - 1.2 mg/dL   GFR calc non Af Amer >90  >90 mL/min   GFR calc Af Amer >90  >90 mL/min  LIPASE, BLOOD     Status: Abnormal   Collection Time    09/18/12 11:35 PM      Result Value Range   Lipase 99 (*) 11 - 59 U/L  LACTIC ACID, PLASMA     Status: None   Collection Time    09/18/12 11:35 PM      Result Value Range   Lactic Acid, Venous 1.7  0.5 - 2.2 mmol/L  PROTIME-INR     Status: None   Collection Time    09/18/12 11:35 PM      Result Value Range   Prothrombin Time 14.1  11.6 - 15.2 seconds   INR 1.10  0.00 - 1.49  URINALYSIS, ROUTINE W  REFLEX MICROSCOPIC     Status: None   Collection Time    09/19/12 12:57 AM      Result Value Range   Color, Urine YELLOW  YELLOW   APPearance CLEAR  CLEAR   Specific Gravity, Urine 1.011  1.005 - 1.030   pH 6.0  5.0 - 8.0   Glucose, UA NEGATIVE  NEGATIVE mg/dL   Hgb urine dipstick NEGATIVE  NEGATIVE   Bilirubin Urine NEGATIVE  NEGATIVE   Ketones, ur NEGATIVE  NEGATIVE mg/dL   Protein, ur NEGATIVE  NEGATIVE mg/dL   Urobilinogen, UA 1.0  0.0 - 1.0 mg/dL   Nitrite NEGATIVE  NEGATIVE   Leukocytes, UA NEGATIVE  NEGATIVE  LIPID PANEL     Status: Abnormal   Collection Time    09/19/12  6:31 AM      Result Value Range   Cholesterol 63  0 - 200 mg/dL   Triglycerides 32  <914 mg/dL   HDL 14 (*) >78 mg/dL   Total CHOL/HDL Ratio 4.5     VLDL 6  0 - 40 mg/dL   LDL Cholesterol 43  0 - 99 mg/dL  BASIC METABOLIC PANEL     Status: Abnormal   Collection Time    09/19/12  6:31 AM      Result Value Range   Sodium 134 (*) 135 - 145 mEq/L   Potassium 3.7  3.5 - 5.1 mEq/L   Chloride 103  96 - 112 mEq/L   CO2 27  19 - 32 mEq/L   Glucose, Bld 93  70 - 99 mg/dL   BUN 6  6 - 23 mg/dL   Creatinine, Ser 2.95  0.50 - 1.10 mg/dL   Calcium 7.8 (*) 8.4 - 10.5 mg/dL   GFR calc non Af Amer >90  >90 mL/min   GFR calc Af Amer >90  >90 mL/min  HEPATIC FUNCTION PANEL     Status: Abnormal   Collection Time    09/19/12  6:31 AM      Result Value Range   Total Protein 6.6  6.0 - 8.3 g/dL   Albumin 2.0 (*) 3.5 - 5.2 g/dL   AST 26  0 - 37 U/L   ALT 13  0 - 35 U/L   Alkaline Phosphatase 83  39 - 117 U/L   Total Bilirubin 0.3  0.3 - 1.2 mg/dL   Bilirubin, Direct 0.1  0.0 - 0.3 mg/dL   Indirect Bilirubin 0.2 (*) 0.3 - 0.9 mg/dL  CBC WITH DIFFERENTIAL     Status: Abnormal   Collection Time    09/19/12  6:31 AM      Result Value Range   WBC 2.7 (*) 4.0 - 10.5 K/uL   RBC 3.31 (*) 3.87 - 5.11 MIL/uL   Hemoglobin 8.1 (*) 12.0 - 15.0 g/dL   HCT 16.1 (*) 09.6 - 04.5 %   MCV 79.5  78.0 - 100.0 fL   MCH 24.5  (*) 26.0 - 34.0 pg   MCHC 30.8  30.0 - 36.0 g/dL   RDW 40.9  81.1 - 91.4 %   Platelets 123 (*) 150 - 400 K/uL   Neutrophils Relative 53  43 - 77 %   Neutro Abs 1.4 (*) 1.7 - 7.7 K/uL   Lymphocytes Relative 34  12 - 46 %   Lymphs Abs 0.9  0.7 - 4.0 K/uL   Monocytes Relative 12  3 - 12 %   Monocytes Absolute 0.3  0.1 - 1.0 K/uL   Eosinophils Relative 0  0 - 5 %   Eosinophils Absolute 0.0  0.0 - 0.7 K/uL   Basophils Relative 0  0 - 1 %   Basophils Absolute 0.0  0.0 - 0.1 K/uL  T-HELPER CELLS (CD4) COUNT     Status: Abnormal   Collection Time    09/19/12  6:31 AM      Result Value Range   CD4 T Cell Abs 150 (*) 400 - 2700 cmm   CD4 % Helper T Cell 16 (*) 33 - 55 %  LIPASE, BLOOD     Status: Abnormal   Collection Time    09/19/12  6:31 AM      Result Value Range   Lipase 80 (*) 11 - 59 U/L  TYPE AND SCREEN     Status: None   Collection Time    09/19/12  6:31 AM      Result Value Range   ABO/RH(D) A POS     Antibody Screen NEG     Sample Expiration 09/22/2012    VITAMIN B12     Status: Abnormal   Collection Time    09/19/12  6:31 AM      Result Value Range   Vitamin B-12 1100 (*) 211 - 911 pg/mL  FOLATE     Status: None   Collection Time    09/19/12  6:31 AM      Result Value Range   Folate 15.1    IRON AND TIBC     Status: Abnormal   Collection Time    09/19/12  6:31 AM      Result Value Range   Iron 14 (*) 42 - 135 ug/dL   TIBC 782  956 - 213 ug/dL   Saturation Ratios 4 (*) 20 - 55 %   UIBC 328  125 - 400 ug/dL  FERRITIN  Status: None   Collection Time    09/19/12  6:31 AM      Result Value Range   Ferritin 10  10 - 291 ng/mL  RETICULOCYTES     Status: Abnormal   Collection Time    09/19/12  6:31 AM      Result Value Range   Retic Ct Pct 1.3  0.4 - 3.1 %   RBC. 3.31 (*) 3.87 - 5.11 MIL/uL   Retic Count, Manual 43.0  19.0 - 186.0 K/uL  ABO/RH     Status: None   Collection Time    09/19/12  6:31 AM      Result Value Range   ABO/RH(D) A POS       US  Abdomen Complete  09/19/2012  *RADIOLOGY REPORT*  Clinical Data:  Right upper quadrant pain.  LIMITED ABDOMINAL ULTRASOUND - RIGHT UPPER QUADRANT  Comparison:  CT today.  Findings:  Gallbladder:  There are no gallstones.  No sonographic Murphy's sign.  Gallbladder wall is mildly thickened at 3.7 mm, with normal below the 3 mm.  This is nonspecific in a patient with HIV and could be due to either the patient's history of hepatitis or HIV.  Common bile duct:  Upper limits of normal for age, 6.5 mm.  Mild intrahepatic biliary ductal dilation is also demonstrated on CT.  Liver:  Cirrhotic.  Small amount of perihepatic ascites.  IMPRESSION: Mild gallbladder wall thickening which is nonspecific in this cirrhotic patient.  This could also be associated with HIV cholangiography.  No gallstones or common duct stone identified. Significantly, no sonographic Murphy's sign.                    Original Report Authenticated By: Andreas Newport, M.D.    Ct Abdomen Pelvis W Contrast  09/19/2012  *RADIOLOGY REPORT*  Clinical Data: Abdominal pain.  Right lower quadrant pain.  Pain radiating to the right flank.  History of HIV and hepatitis C.  CT ABDOMEN AND PELVIS WITH CONTRAST  Technique:  Multidetector CT imaging of the abdomen and pelvis was performed following the standard protocol during bolus administration of intravenous contrast.  Contrast: OMNIPAQUE IOHEXOL 300 MG/ML  SOLN  Comparison: None.  Findings: Lung Bases: Dependent atelectasis.  Linear scarring in the right middle lobe adjacent to the heart.  Liver:  Hepatic cirrhosis.  Diffuse micronodular contour.  No focal mass lesion is identified.  Mild intrahepatic biliary ductal dilation.  Spleen:  Splenomegaly compatible with portal venous hypertension.  Gallbladder:  Grossly normal.  No calcified stones.  Common bile duct:  Normal for age.  Pancreas:  Mild prominence of pancreatic duct.  No focal mass lesions.  The pancreas appears full.  Adrenal glands:  Normal.   Kidneys:  Normal enhancement and excretion.  The ureters appear within normal limits.  Sub centimeter left inferior pole renal cyst.  Stomach:  Decompressed.  Numerous upper abdominal lymph nodes are present, which may be associated with HIV, congestive or reactive. Neoplastic nodes are a consideration.  Index node along the lesser curvature of the stomach measures 14 mm x 33 mm.  Small bowel:  No small bowel obstruction.  No definite mural thickening.  Colon:   Ileocecal valve appears normal.  The appendix is not identified.  Abnormal soft tissue is present in the right pericolic gutter.  There is circumferential mural thickening in the proximal ascending colon, with an element of obstruction likely.  This enhancing mass demonstrates several areas of low attenuation.  Differential considerations include neoplasm such as primary colon cancer, lymphoma or colitis.  This is focal and restricted to the ascending colon.  There is no involvement of the transverse or descending colon.  Pelvic Genitourinary:  Grossly within normal limits.  Urinary bladder normal.  Portal venous collaterals and prominent hemorrhoidal vein.  Bones:  No AVN.  No aggressive osseous lesions.  Spondylosis.  Vasculature: Portal venous hypertension with collaterals in the upper abdomen.  Retroperitoneal collaterals are present as well. Recanalization of the periumbilical vein. Atherosclerosis of the aorta and iliac vessels.  IMPRESSION: 1.  Hepatic cirrhosis with a small amount of ascites, splenomegaly and portal venous hypertension. 2.  Mass in the ascending colon is suspicious for neoplasm.  Focal colitis considered less likely.  In this patient with HIV, colonic lymphoma is a definite consideration.  Adjacent soft tissue stranding extending into the right pericolic gutter raises the possibility of peritoneal spread of disease. 3.  Fullness of the pancreas with prominence of pancreatic duct. Findings suggestive of pancreatitis. 4.  Abdominal  lymphadenopathy is nonspecific reactive, congestive, associated with HIV, or neoplastic in this patient with colonic mass.  Critical Value/emergent results were called by telephone at the time of interpretation on 09/19/2012 at 0141 hours to Dr. Manus Gunning, Jeannett Senior, who verbally acknowledged these results. We discussed the ultrasound dated been ordered but would likely add little based on the CT findings.   Original Report Authenticated By: Andreas Newport, M.D.     ROS:  As stated above in the HPI otherwise negative.  Blood pressure 89/59, pulse 76, temperature 98.6 F (37 C), temperature source Oral, resp. rate 16, height 5' 3.5" (1.613 m), weight 152 lb 1.9 oz (69 kg), SpO2 100.00%.    PE: Gen: NAD, Alert and Oriented, Histrionic, tearful HEENT:  [place]/AT, EOMI Neck: Supple, no LAD Lungs: CTA Bilaterally CV: RRR without M/G/R ABM: Soft, distended, no significant tympany, RLQ pain, +BS Ext: No C/C/E  Assessment/Plan: 1) Ascending colon mass. 2) HIV. 3) HCV with probable cirrhosis.   The patient requires a colonoscopy.  I do not know the nature of the mass in the ascending colon.  My only reservation with performing the procedure is that she may be obstructed.  This was suggested on the CT scan.  If she cannot tolerate the prep, then I will call Surgery to evaluate the patient.  Plan: 1) Attempt colonoscopy tomorrow.  Mystie Ormand D 09/19/2012, 4:15 PM

## 2012-09-19 NOTE — Progress Notes (Signed)
[facility]                                                                                Patient Demographics  Ruth Stephenson, is a 62 y.o. female, DOB - 04/08/1951, WUJ:[id number], [id number]:[id number]  Admit date - 09/18/2012  Admitting Physician Eduard Clos, MD  Outpatient Primary MD for the patient is No primary provider on file.  LOS - 1   Chief Complaint  Patient presents with  . Abdominal Pain        Assessment & Plan    1. Abdominal pain noted in the right lower quadrant. Patient also has history of HIV, hep C with cirrhosis, CT suggestive of ascending colon mass. She has some tenderness to the right lower quadrant, mass could be malignancy versus infectious, stool cultures, GI pathogen panel PCR, ova parasites ordered, for now Cefotaxime (ascites) and Flagyl, GI called along with ID to evaluate the patient.     2. HIV - check viral load and CD4 count. Continue present home medications. Has been out of for HIV medications for the last 1 month since she moved from [place], ID requested to see the patient     3. History of hepatitis C - probably has cirrhosis presently per the CAT scan. Consult GI. Will placed patient on empiric antibiotics as in #1.     4. Anemia - likely anemia of chronic disease plus dilutional fall from IV fluids, stool for occult blood done by the ER physician was negative. Patient's last hemoglobin in the chart was more than 4 years ago which was normal. Patient states that she didn't see any obvious GI bleed or black stools. She has been taking pain relief medications over the last few days which includes salicylates. At this time  check anemia panel. Type and screen. Protonix IV.     5. Tobacco abuse - advised to quit smoking.     6. Thrombocytopenia. Likely cirrhosis related, we will monitor, we will avoid heparin Lovenox.      Code Status: full  Family Communication: patient  Disposition Plan:  home   Procedures CT abd Pelvis, Abd [place]   Consults  Gi, ID   DVT Prophylaxis    SCDs    Lab Results  Component Value Date   PLT 123* 09/19/2012    Medications  Scheduled Meds: . elvitegravir-cobicistat-emtricitabine-tenofovir  1 tablet Oral Q breakfast  . metroNIDAZOLE  500 mg Oral Q8H  . pantoprazole (PROTONIX) IV  40 mg Intravenous Q24H   Continuous Infusions: . sodium chloride 75 mL/hr at 09/19/12 0553   PRN Meds:.acetaminophen, acetaminophen, HYDROmorphone (DILAUDID) injection, ondansetron (ZOFRAN) IV, ondansetron  Antibiotics     Anti-infectives   Start     Dose/Rate Route Frequency Ordered Stop   09/19/12 0915  metroNIDAZOLE (FLAGYL) tablet 500 mg     500 mg Oral 3 times per day 09/19/12 0906     09/19/12 0800  elvitegravir-cobicistat-emtricitabine-tenofovir (STRIBILD) 150-150-200-300 MG tablet 1 tablet     1 tablet Oral Daily with breakfast 09/19/12 0512     09/19/12 0600  cefTRIAXone (ROCEPHIN) 1 g in dextrose 5 % 50 mL IVPB  Status:  Discontinued  1 g 100 mL/hr over 30 Minutes Intravenous Every 24 hours 09/19/12 0512 09/19/12 0906       Time Spent in minutes   35   SINGH,PRASHANT K M.D on 09/19/2012 at 9:07 AM  Between 7am to 7pm - Pager - [phone]  After 7pm go to [email or web address] - password [email or web address]  And look for the night coverage person covering for me after hours  [facility] Office  [phone]    Subjective:   [name] today has, No headache, No chest pain, No abdominal pain - No Nausea, No new weakness tingling or numbness, No Cough - SOB.   Objective:   Filed Vitals:   09/18/12 2257 09/19/12 0502 09/19/12 0508  BP: 111/81 105/61 101/65  Pulse: 86 71 74  Temp: 99.8 F (37.7 C) 99.2 F (37.3 C) 98.9 F (37.2 C)  TempSrc: Oral Oral Oral  Resp:  18 16  Height:   5' 3.5" (1.613 m)  Weight:   69 kg (152 lb 1.9 oz)  SpO2: 99% 98% 96%    Wt Readings from Last 3 Encounters:  09/19/12 69 kg (152 lb 1.9 oz)      Intake/Output Summary (Last 24 hours) at 09/19/12 0907 Last data filed at 09/19/12 0759  Gross per 24 hour  Intake   1480 ml  Output      0 ml  Net   1480 ml    Exam Awake Alert, Oriented X 3, No new F.N deficits, Normal affect [place].AT,PERRAL Supple Neck,No JVD, No cervical lymphadenopathy appriciated.  Symmetrical Chest wall movement, Good air movement bilaterally, CTAB RRR,No Gallops,Rubs or new Murmurs, No Parasternal Heave +ve B.Sounds, Abd MILDLY DISTENDED AND TENDER RLQ, No organomegaly appriciated, No rebound - guarding or rigidity. No Cyanosis, Clubbing or edema, No new Rash or bruise     Data Review   Micro Results No results found for this or any previous visit (from the past 240 hour(s)).  Radiology Reports US Abdomen Complete  09/19/2012  *RADIOLOGY REPORT*  Clinical Data:  Right upper quadrant pain.  LIMITED ABDOMINAL ULTRASOUND - RIGHT UPPER QUADRANT  Comparison:  CT today.  Findings:  Gallbladder:  There are no gallstones.  No sonographic Murphy's sign.  Gallbladder wall is mildly thickened at 3.7 mm, with normal below the 3 mm.  This is nonspecific in a patient with HIV and could be due to either the patient's history of hepatitis or HIV.  Common bile duct:  Upper limits of normal for age, 6.5 mm.  Mild intrahepatic biliary ductal dilation is also demonstrated on CT.  Liver:  Cirrhotic.  Small amount of perihepatic ascites.  IMPRESSION: Mild gallbladder wall thickening which is nonspecific in this cirrhotic patient.  This could also be associated with HIV cholangiography.  No gallstones or common duct stone identified. Significantly, no sonographic Murphy's sign.                    Original Report Authenticated By: Andreas Newport, M.D.    Ct Abdomen Pelvis W Contrast  09/19/2012  *RADIOLOGY REPORT*  Clinical Data: Abdominal pain.  Right lower quadrant pain.  Pain radiating to the right flank.  History of HIV and hepatitis C.  CT ABDOMEN AND PELVIS WITH CONTRAST   Technique:  Multidetector CT imaging of the abdomen and pelvis was performed following the standard protocol during bolus administration of intravenous contrast.  Contrast: OMNIPAQUE IOHEXOL 300 MG/ML  SOLN  Comparison: None.  Findings: Lung Bases: Dependent atelectasis.  Linear scarring  in the right middle lobe adjacent to the heart.  Liver:  Hepatic cirrhosis.  Diffuse micronodular contour.  No focal mass lesion is identified.  Mild intrahepatic biliary ductal dilation.  Spleen:  Splenomegaly compatible with portal venous hypertension.  Gallbladder:  Grossly normal.  No calcified stones.  Common bile duct:  Normal for age.  Pancreas:  Mild prominence of pancreatic duct.  No focal mass lesions.  The pancreas appears full.  Adrenal glands:  Normal.  Kidneys:  Normal enhancement and excretion.  The ureters appear within normal limits.  Sub centimeter left inferior pole renal cyst.  Stomach:  Decompressed.  Numerous upper abdominal lymph nodes are present, which may be associated with HIV, congestive or reactive. Neoplastic nodes are a consideration.  Index node along the lesser curvature of the stomach measures 14 mm x 33 mm.  Small bowel:  No small bowel obstruction.  No definite mural thickening.  Colon:   Ileocecal valve appears normal.  The appendix is not identified.  Abnormal soft tissue is present in the right pericolic gutter.  There is circumferential mural thickening in the proximal ascending colon, with an element of obstruction likely.  This enhancing mass demonstrates several areas of low attenuation. Differential considerations include neoplasm such as primary colon cancer, lymphoma or colitis.  This is focal and restricted to the ascending colon.  There is no involvement of the transverse or descending colon.  Pelvic Genitourinary:  Grossly within normal limits.  Urinary bladder normal.  Portal venous collaterals and prominent hemorrhoidal vein.  Bones:  No AVN.  No aggressive osseous lesions.   Spondylosis.  Vasculature: Portal venous hypertension with collaterals in the upper abdomen.  Retroperitoneal collaterals are present as well. Recanalization of the periumbilical vein. Atherosclerosis of the aorta and iliac vessels.  IMPRESSION: 1.  Hepatic cirrhosis with a small amount of ascites, splenomegaly and portal venous hypertension. 2.  Mass in the ascending colon is suspicious for neoplasm.  Focal colitis considered less likely.  In this patient with HIV, colonic lymphoma is a definite consideration.  Adjacent soft tissue stranding extending into the right pericolic gutter raises the possibility of peritoneal spread of disease. 3.  Fullness of the pancreas with prominence of pancreatic duct. Findings suggestive of pancreatitis. 4.  Abdominal lymphadenopathy is nonspecific reactive, congestive, associated with HIV, or neoplastic in this patient with colonic mass.  Critical Value/emergent results were called by telephone at the time of interpretation on 09/19/2012 at 0141 hours to Dr. Manus Gunning, Jeannett Senior, who verbally acknowledged these results. We discussed the ultrasound dated been ordered but would likely add little based on the CT findings.   Original Report Authenticated By: Andreas Newport, M.D.     CBC  Recent Labs Lab 09/18/12 2335 09/19/12 0631  WBC 3.6* 2.7*  HGB 9.5* 8.1*  HCT 30.3* 26.3*  PLT 139* 123*  MCV 79.3 79.5  MCH 24.9* 24.5*  MCHC 31.4 30.8  RDW 15.3 15.1  LYMPHSABS 0.7 0.9  MONOABS 0.4 0.3  EOSABS 0.0 0.0  BASOSABS 0.0 0.0    Chemistries   Recent Labs Lab 09/18/12 2335 09/19/12 0631  NA 135 134*  K 4.2 3.7  CL 102 103  CO2 25 27  GLUCOSE 113* 93  BUN 8 6  CREATININE 0.50 0.50  CALCIUM 8.5 7.8*  AST 37 26  ALT 17 13  ALKPHOS 106 83  BILITOT 0.3 0.3   ------------------------------------------------------------------------------------------------------------------ estimated creatinine clearance is 70.6 ml/min (by C-G formula based on Cr of  0.5). ------------------------------------------------------------------------------------------------------------------ No results  found for this basename: HGBA1C,  in the last 72 hours ------------------------------------------------------------------------------------------------------------------  Recent Labs  09/19/12 0631  CHOL 63  HDL 14*  LDLCALC 43  TRIG 32  CHOLHDL 4.5   ------------------------------------------------------------------------------------------------------------------ No results found for this basename: TSH, T4TOTAL, FREET3, T3FREE, THYROIDAB,  in the last 72 hours ------------------------------------------------------------------------------------------------------------------  Recent Labs  09/19/12 0631  RETICCTPCT 1.3    Coagulation profile  Recent Labs Lab 09/18/12 2335  INR 1.10    No results found for this basename: DDIMER,  in the last 72 hours  Cardiac Enzymes No results found for this basename: CK, CKMB, TROPONINI, MYOGLOBIN,  in the last 168 hours ------------------------------------------------------------------------------------------------------------------ No components found with this basename: POCBNP,

## 2012-09-19 NOTE — Consult Note (Addendum)
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 Genotype 2013 RT (-), PI no significant mutations   we'll try to get in touch with her previous physician (Dr. Gaspar Skeeters (out on medical leave) ---> DrScherer, at [facility] in [place] [phone] Ruth Stephenson)  Comment: Her previous illness in 2013 seem suspicious for the beginning of her current illness (after speaking with a physician at [facility], on a CT scan at that time she had a circumferential ulcerative lesion). Hopefully we will be able to get a biopsy of her lesion. The differential this is very broad for this and includes but is not limited to MAI, lymphoma, Kaposi's sarcoma, CMV.   Thank you so much for this interesting consult,   Johny Sax [phone]  Ruth Stephenson is an 62 y.o. female.  HPI: 51 yo F with hx of HIV+ since 1988. She was hospitalized in roughly March 2013 with right-sided abdominal pain. she did have blood in her stool that time. She was hospitalized at [facility] in [place]. She was treated with antibiotics and improved. She had an upper endoscopy was negative. She states she was unable to have a lower endoscopy due to pain. She was never on antiretroviral therapy until that time when she was started on Stribilid. She moved to [place] roughly 6 weeks ago, and then ran out of her antiretroviral therapy roughly 30 days ago. She returned to the hospital 3-26 with right-sided abdominal pain. Fevers and chills, No change in her weight, Constipation, Abdominal swelling, but denies blood per rectum. Cd4 152, VL <20 (01-2012,  06-2012) Past Medical History  Diagnosis Date  . Hepatitis C   . HIV (human immunodeficiency virus infection)     History reviewed. No pertinent past surgical history.   No Known Allergies  Medications:  Scheduled: . cefoTAXime (CLAFORAN) IV  1 g Intravenous Q6H  . elvitegravir-cobicistat-emtricitabine-tenofovir  1 tablet Oral Q breakfast  . metroNIDAZOLE  500 mg Oral Q8H  . pantoprazole (PROTONIX) IV  40 mg Intravenous Q24H    Total days of antibiotics: 1   Social History:  reports that she has been smoking Cigarettes.  She has been smoking about 0.50 packs per day. She does not have any smokeless tobacco history on file. She reports that she does not drink alcohol or use illicit drugs.  Family History  Problem Relation Age of Onset  . Cancer Mother     General ROS: See history of present illness. No lymphadenopathy. Positive headaches which she attributes to being hungry. She states she's been afraid to eat. No chest pain she has had shortness of breath and dyspnea do to her increasing abdominal girth.  Blood pressure 89/59, pulse 76, temperature 98.6 F (37 C), temperature source Oral, resp. rate 16, height 5' 3.5" (1.613 m), weight 69 kg (152 lb 1.9 oz), SpO2 100.00%. General appearance: alert, cooperative and no distress Eyes: negative findings: pupils equal, round, reactive to light and accomodation Throat: normal findings: oropharynx pink & moist without lesions or evidence of thrush Neck: no adenopathy and supple, symmetrical, trachea midline Lungs: clear to auscultation bilaterally Heart: regular rate and rhythm Abdomen: normal findings: bowel sounds normal and  soft, non-tender and abnormal findings:  distended Extremities: edema 2+ left lower extremity greater than right lower extremity Lymph nodes: Cervical, supraclavicular, and axillary nodes normal.   Results for orders placed during the hospital encounter of 09/18/12 (from the past 48 hour(s))  CBC WITH  DIFFERENTIAL     Status: Abnormal   Collection Time    09/18/12 11:35 PM      Result Value Range   WBC 3.6 (*) 4.0 - 10.5 K/uL   RBC 3.82 (*) 3.87 - 5.11 MIL/uL   Hemoglobin 9.5 (*) 12.0 - 15.0 g/dL   HCT 86.5 (*) 78.4 - 69.6 %   MCV 79.3  78.0 - 100.0 fL   MCH 24.9 (*) 26.0 - 34.0 pg   MCHC 31.4  30.0 - 36.0 g/dL   RDW 29.5  28.4 - 13.2 %   Platelets 139 (*) 150 - 400 K/uL   Neutrophils Relative 69  43 - 77 %   Neutro Abs 2.5  1.7 - 7.7 K/uL   Lymphocytes Relative 20  12 - 46 %   Lymphs Abs 0.7  0.7 - 4.0 K/uL   Monocytes Relative 10  3 - 12 %   Monocytes Absolute 0.4  0.1 - 1.0 K/uL   Eosinophils Relative 0  0 - 5 %   Eosinophils Absolute 0.0  0.0 - 0.7 K/uL   Basophils Relative 0  0 - 1 %   Basophils Absolute 0.0  0.0 - 0.1 K/uL  COMPREHENSIVE METABOLIC PANEL     Status: Abnormal   Collection Time    09/18/12 11:35 PM      Result Value Range   Sodium 135  135 - 145 mEq/L   Potassium 4.2  3.5 - 5.1 mEq/L   Chloride 102  96 - 112 mEq/L   CO2 25  19 - 32 mEq/L   Glucose, Bld 113 (*) 70 - 99 mg/dL   BUN 8  6 - 23 mg/dL   Creatinine, Ser 4.40  0.50 - 1.10 mg/dL   Calcium 8.5  8.4 - 10.2 mg/dL   Total Protein 7.3  6.0 - 8.3 g/dL   Albumin 2.3 (*) 3.5 - 5.2 g/dL   AST 37  0 - 37 U/L   ALT 17  0 - 35 U/L   Alkaline Phosphatase 106  39 - 117 U/L   Total Bilirubin 0.3  0.3 - 1.2 mg/dL   GFR calc non Af Amer >90  >90 mL/min   GFR calc Af Amer >90  >90 mL/min   Comment:            The eGFR has been calculated     using the CKD EPI equation.     This calculation has not been     validated in all clinical     situations.     eGFR's persistently     <90 mL/min signify     possible Chronic Kidney Disease.  LIPASE, BLOOD     Status: Abnormal   Collection Time    09/18/12 11:35 PM      Result Value Range   Lipase 99 (*) 11 - 59 U/L  LACTIC ACID, PLASMA     Status: None   Collection Time    09/18/12 11:35 PM      Result Value Range   Lactic Acid, Venous 1.7  0.5 - 2.2  mmol/L  PROTIME-INR     Status: None   Collection Time    09/18/12 11:35 PM  Result Value Range   Prothrombin Time 14.1  11.6 - 15.2 seconds   INR 1.10  0.00 - 1.49  URINALYSIS, ROUTINE W REFLEX MICROSCOPIC     Status: None   Collection Time    09/19/12 12:57 AM      Result Value Range   Color, Urine YELLOW  YELLOW   APPearance CLEAR  CLEAR   Specific Gravity, Urine 1.011  1.005 - 1.030   pH 6.0  5.0 - 8.0   Glucose, UA NEGATIVE  NEGATIVE mg/dL   Hgb urine dipstick NEGATIVE  NEGATIVE   Bilirubin Urine NEGATIVE  NEGATIVE   Ketones, ur NEGATIVE  NEGATIVE mg/dL   Protein, ur NEGATIVE  NEGATIVE mg/dL   Urobilinogen, UA 1.0  0.0 - 1.0 mg/dL   Nitrite NEGATIVE  NEGATIVE   Leukocytes, UA NEGATIVE  NEGATIVE   Comment: MICROSCOPIC NOT DONE ON URINES WITH NEGATIVE PROTEIN, BLOOD, LEUKOCYTES, NITRITE, OR GLUCOSE <1000 mg/dL.  LIPID PANEL     Status: Abnormal   Collection Time    09/19/12  6:31 AM      Result Value Range   Cholesterol 63  0 - 200 mg/dL   Triglycerides 32  <161 mg/dL   HDL 14 (*) >09 mg/dL   Total CHOL/HDL Ratio 4.5     VLDL 6  0 - 40 mg/dL   LDL Cholesterol 43  0 - 99 mg/dL   Comment:            Total Cholesterol/HDL:CHD Risk     Coronary Heart Disease Risk Table                         Men   Women      1/2 Average Risk   3.4   3.3      Average Risk       5.0   4.4      2 X Average Risk   9.6   7.1      3 X Average Risk  23.4   11.0                Use the calculated Patient Ratio     above and the CHD Risk Table     to determine the patient's CHD Risk.                ATP III CLASSIFICATION (LDL):      <100     mg/dL   Optimal      604-540  mg/dL   Near or Above                        Optimal      130-159  mg/dL   Borderline      981-191  mg/dL   High      >478     mg/dL   Very High  BASIC METABOLIC PANEL     Status: Abnormal   Collection Time    09/19/12  6:31 AM      Result Value Range   Sodium 134 (*) 135 - 145 mEq/L   Potassium 3.7  3.5 - 5.1  mEq/L   Chloride 103  96 - 112 mEq/L   CO2 27  19 - 32 mEq/L   Glucose, Bld 93  70 - 99 mg/dL   BUN 6  6 - 23 mg/dL   Creatinine, Ser 2.95  0.50 - 1.10 mg/dL   Calcium 7.8 (*)  8.4 - 10.5 mg/dL   GFR calc non Af Amer >90  >90 mL/min   GFR calc Af Amer >90  >90 mL/min   Comment:            The eGFR has been calculated     using the CKD EPI equation.     This calculation has not been     validated in all clinical     situations.     eGFR's persistently     <90 mL/min signify     possible Chronic Kidney Disease.  HEPATIC FUNCTION PANEL     Status: Abnormal   Collection Time    09/19/12  6:31 AM      Result Value Range   Total Protein 6.6  6.0 - 8.3 g/dL   Albumin 2.0 (*) 3.5 - 5.2 g/dL   AST 26  0 - 37 U/L   ALT 13  0 - 35 U/L   Alkaline Phosphatase 83  39 - 117 U/L   Total Bilirubin 0.3  0.3 - 1.2 mg/dL   Bilirubin, Direct 0.1  0.0 - 0.3 mg/dL   Indirect Bilirubin 0.2 (*) 0.3 - 0.9 mg/dL  CBC WITH DIFFERENTIAL     Status: Abnormal   Collection Time    09/19/12  6:31 AM      Result Value Range   WBC 2.7 (*) 4.0 - 10.5 K/uL   RBC 3.31 (*) 3.87 - 5.11 MIL/uL   Hemoglobin 8.1 (*) 12.0 - 15.0 g/dL   HCT 16.1 (*) 09.6 - 04.5 %   MCV 79.5  78.0 - 100.0 fL   MCH 24.5 (*) 26.0 - 34.0 pg   MCHC 30.8  30.0 - 36.0 g/dL   RDW 40.9  81.1 - 91.4 %   Platelets 123 (*) 150 - 400 K/uL   Neutrophils Relative 53  43 - 77 %   Neutro Abs 1.4 (*) 1.7 - 7.7 K/uL   Lymphocytes Relative 34  12 - 46 %   Lymphs Abs 0.9  0.7 - 4.0 K/uL   Monocytes Relative 12  3 - 12 %   Monocytes Absolute 0.3  0.1 - 1.0 K/uL   Eosinophils Relative 0  0 - 5 %   Eosinophils Absolute 0.0  0.0 - 0.7 K/uL   Basophils Relative 0  0 - 1 %   Basophils Absolute 0.0  0.0 - 0.1 K/uL  LIPASE, BLOOD     Status: Abnormal   Collection Time    09/19/12  6:31 AM      Result Value Range   Lipase 80 (*) 11 - 59 U/L  TYPE AND SCREEN     Status: None   Collection Time    09/19/12  6:31 AM      Result Value Range    ABO/RH(D) A POS     Antibody Screen NEG     Sample Expiration 09/22/2012    VITAMIN B12     Status: Abnormal   Collection Time    09/19/12  6:31 AM      Result Value Range   Vitamin B-12 1100 (*) 211 - 911 pg/mL  FOLATE     Status: None   Collection Time    09/19/12  6:31 AM      Result Value Range   Folate 15.1     Comment: (NOTE)     Reference Ranges            Deficient:       0.4 -  3.3 ng/mL            Indeterminate:   3.4 - 5.4 ng/mL            Normal:              > 5.4 ng/mL  IRON AND TIBC     Status: Abnormal   Collection Time    09/19/12  6:31 AM      Result Value Range   Iron 14 (*) 42 - 135 ug/dL   TIBC 161  096 - 045 ug/dL   Saturation Ratios 4 (*) 20 - 55 %   UIBC 328  125 - 400 ug/dL  FERRITIN     Status: None   Collection Time    09/19/12  6:31 AM      Result Value Range   Ferritin 10  10 - 291 ng/mL  RETICULOCYTES     Status: Abnormal   Collection Time    09/19/12  6:31 AM      Result Value Range   Retic Ct Pct 1.3  0.4 - 3.1 %   RBC. 3.31 (*) 3.87 - 5.11 MIL/uL   Retic Count, Manual 43.0  19.0 - 186.0 K/uL  ABO/RH     Status: None   Collection Time    09/19/12  6:31 AM      Result Value Range   ABO/RH(D) A POS        Component Value Date/Time   SDES URINE, RANDOM 04/12/2009 0803   SPECREQUEST ADDED AT 1012 04/12/2009 0803   CULT Multiple bacterial morphotypes present, none predominant. Suggest appropriate recollection if clinically indicated. 04/12/2009 0803   REPTSTATUS 04/14/2009 FINAL 04/12/2009 0803   US Abdomen Complete  09/19/2012  *RADIOLOGY REPORT*  Clinical Data:  Right upper quadrant pain.  LIMITED ABDOMINAL ULTRASOUND - RIGHT UPPER QUADRANT  Comparison:  CT today.  Findings:  Gallbladder:  There are no gallstones.  No sonographic Murphy's sign.  Gallbladder wall is mildly thickened at 3.7 mm, with normal below the 3 mm.  This is nonspecific in a patient with HIV and could be due to either the patient's history of hepatitis or HIV.   Common bile duct:  Upper limits of normal for age, 6.5 mm.  Mild intrahepatic biliary ductal dilation is also demonstrated on CT.  Liver:  Cirrhotic.  Small amount of perihepatic ascites.  IMPRESSION: Mild gallbladder wall thickening which is nonspecific in this cirrhotic patient.  This could also be associated with HIV cholangiography.  No gallstones or common duct stone identified. Significantly, no sonographic Murphy's sign.                    Original Report Authenticated By: Andreas Newport, M.D.    Ct Abdomen Pelvis W Contrast  09/19/2012  *RADIOLOGY REPORT*  Clinical Data: Abdominal pain.  Right lower quadrant pain.  Pain radiating to the right flank.  History of HIV and hepatitis C.  CT ABDOMEN AND PELVIS WITH CONTRAST  Technique:  Multidetector CT imaging of the abdomen and pelvis was performed following the standard protocol during bolus administration of intravenous contrast.  Contrast: OMNIPAQUE IOHEXOL 300 MG/ML  SOLN  Comparison: None.  Findings: Lung Bases: Dependent atelectasis.  Linear scarring in the right middle lobe adjacent to the heart.  Liver:  Hepatic cirrhosis.  Diffuse micronodular contour.  No focal mass lesion is identified.  Mild intrahepatic biliary ductal dilation.  Spleen:  Splenomegaly compatible with portal venous hypertension.  Gallbladder:  Grossly normal.  No calcified stones.  Common bile duct:  Normal for age.  Pancreas:  Mild prominence of pancreatic duct.  No focal mass lesions.  The pancreas appears full.  Adrenal glands:  Normal.  Kidneys:  Normal enhancement and excretion.  The ureters appear within normal limits.  Sub centimeter left inferior pole renal cyst.  Stomach:  Decompressed.  Numerous upper abdominal lymph nodes are present, which may be associated with HIV, congestive or reactive. Neoplastic nodes are a consideration.  Index node along the lesser curvature of the stomach measures 14 mm x 33 mm.  Small bowel:  No small bowel obstruction.  No definite  mural thickening.  Colon:   Ileocecal valve appears normal.  The appendix is not identified.  Abnormal soft tissue is present in the right pericolic gutter.  There is circumferential mural thickening in the proximal ascending colon, with an element of obstruction likely.  This enhancing mass demonstrates several areas of low attenuation. Differential considerations include neoplasm such as primary colon cancer, lymphoma or colitis.  This is focal and restricted to the ascending colon.  There is no involvement of the transverse or descending colon.  Pelvic Genitourinary:  Grossly within normal limits.  Urinary bladder normal.  Portal venous collaterals and prominent hemorrhoidal vein.  Bones:  No AVN.  No aggressive osseous lesions.  Spondylosis.  Vasculature: Portal venous hypertension with collaterals in the upper abdomen.  Retroperitoneal collaterals are present as well. Recanalization of the periumbilical vein. Atherosclerosis of the aorta and iliac vessels.  IMPRESSION: 1.  Hepatic cirrhosis with a small amount of ascites, splenomegaly and portal venous hypertension. 2.  Mass in the ascending colon is suspicious for neoplasm.  Focal colitis considered less likely.  In this patient with HIV, colonic lymphoma is a definite consideration.  Adjacent soft tissue stranding extending into the right pericolic gutter raises the possibility of peritoneal spread of disease. 3.  Fullness of the pancreas with prominence of pancreatic duct. Findings suggestive of pancreatitis. 4.  Abdominal lymphadenopathy is nonspecific reactive, congestive, associated with HIV, or neoplastic in this patient with colonic mass.  Critical Value/emergent results were called by telephone at the time of interpretation on 09/19/2012 at 0141 hours to Dr. Manus Gunning, [facility] Senior, who verbally acknowledged these results. We discussed the ultrasound dated been ordered but would likely add little based on the CT findings.   Original Report Authenticated By:  Andreas Newport, M.D.    No results found for this or any previous visit (from the past 240 hour(s)).    09/19/2012, 2:41 PM     LOS: 1 day

## 2012-09-20 ENCOUNTER — Encounter (HOSPITAL_COMMUNITY): Payer: Self-pay | Admitting: *Deleted

## 2012-09-20 ENCOUNTER — Encounter (HOSPITAL_COMMUNITY)
Admission: EM | Disposition: A | Discharge status: Home / Self Care | Payer: Self-pay | Source: Home / Self Care | Attending: Internal Medicine

## 2012-09-20 DIAGNOSIS — C189 Malignant neoplasm of colon, unspecified: Secondary | ICD-10-CM

## 2012-09-20 DIAGNOSIS — B1920 Unspecified viral hepatitis C without hepatic coma: Secondary | ICD-10-CM

## 2012-09-20 DIAGNOSIS — Z21 Asymptomatic human immunodeficiency virus [HIV] infection status: Secondary | ICD-10-CM

## 2012-09-20 HISTORY — PX: COLONOSCOPY: SHX5424

## 2012-09-20 LAB — CBC
HCT: 27.7 % — ABNORMAL LOW (ref 36.0–46.0)
Hemoglobin: 8.6 g/dL — ABNORMAL LOW (ref 12.0–15.0)
MCHC: 31 g/dL (ref 30.0–36.0)
MCV: 79.6 fL (ref 78.0–100.0)
RDW: 15.4 % (ref 11.5–15.5)

## 2012-09-20 LAB — COMPREHENSIVE METABOLIC PANEL
Albumin: 2.1 g/dL — ABNORMAL LOW (ref 3.5–5.2)
Alkaline Phosphatase: 74 U/L (ref 39–117)
BUN: 5 mg/dL — ABNORMAL LOW (ref 6–23)
Chloride: 103 mEq/L (ref 96–112)
Creatinine, Ser: 0.52 mg/dL (ref 0.50–1.10)
GFR calc Af Amer: >90 mL/min (ref >90)
Glucose, Bld: 81 mg/dL (ref 70–99)
Total Bilirubin: 0.4 mg/dL (ref 0.3–1.2)
Total Protein: 6.6 g/dL (ref 6.0–8.3)

## 2012-09-20 SURGERY — COLONOSCOPY
Anesthesia: Moderate Sedation

## 2012-09-20 MED ORDER — BOOST / RESOURCE BREEZE PO LIQD
1.0000 | Freq: Two times a day (BID) | ORAL | Status: DC
  Administered 2012-09-21: 1 via ORAL

## 2012-09-20 MED ORDER — DIPHENHYDRAMINE HCL 50 MG/ML IJ SOLN
INTRAMUSCULAR | Status: DC | PRN
  Administered 2012-09-20 (×2): 25 mg via INTRAVENOUS

## 2012-09-20 MED ORDER — FUROSEMIDE 10 MG/ML IJ SOLN
40.0000 mg | Freq: Once | INTRAMUSCULAR | Status: DC
Start: 2012-09-20 — End: 2012-09-20
  Filled 2012-09-20: qty 4

## 2012-09-20 MED ORDER — PIPERACILLIN-TAZOBACTAM 3.375 G IVPB
3.3750 g | Freq: Three times a day (TID) | INTRAVENOUS | Status: DC
  Administered 2012-09-20 – 2012-09-24 (×12): 3.375 g via INTRAVENOUS
  Filled 2012-09-20 (×14): qty 50

## 2012-09-20 MED ORDER — POTASSIUM CHLORIDE CRYS ER 20 MEQ PO TBCR: 20.0000 meq | EXTENDED_RELEASE_TABLET | Freq: Once | ORAL | Status: DC

## 2012-09-20 MED ORDER — DIPHENHYDRAMINE HCL 50 MG/ML IJ SOLN
INTRAMUSCULAR | Status: AC
  Filled 2012-09-20: qty 1

## 2012-09-20 MED ORDER — ADULT MULTIVITAMIN W/MINERALS CH
1.0000 | ORAL_TABLET | Freq: Every day | ORAL | Status: DC
  Administered 2012-09-21 – 2012-09-22 (×2): 1 via ORAL
  Filled 2012-09-20 (×4): qty 1

## 2012-09-20 MED ORDER — ACETAMINOPHEN 500 MG PO TABS: 500.0000 mg | ORAL_TABLET | Freq: Two times a day (BID) | ORAL | Status: DC | PRN

## 2012-09-20 MED ORDER — FENTANYL CITRATE 0.05 MG/ML IJ SOLN
INTRAMUSCULAR | Status: DC | PRN
  Administered 2012-09-20 (×3): 25 ug via INTRAVENOUS

## 2012-09-20 MED ORDER — MIDAZOLAM HCL 5 MG/5ML IJ SOLN
INTRAMUSCULAR | Status: DC | PRN
  Administered 2012-09-20 (×3): 2.5 mg via INTRAVENOUS

## 2012-09-20 MED ORDER — SPOT INK MARKER SYRINGE KIT
PACK | SUBMUCOSAL | Status: AC
  Filled 2012-09-20: qty 10

## 2012-09-20 MED ORDER — SODIUM CHLORIDE 0.9 % IV SOLN
INTRAVENOUS | Status: DC
  Administered 2012-09-20: 13:00:00 via INTRAVENOUS

## 2012-09-20 MED ORDER — MIDAZOLAM HCL 10 MG/2ML IJ SOLN
INTRAMUSCULAR | Status: AC
  Filled 2012-09-20: qty 6

## 2012-09-20 MED ORDER — FENTANYL CITRATE 0.05 MG/ML IJ SOLN
INTRAMUSCULAR | Status: AC
  Filled 2012-09-20: qty 4

## 2012-09-20 MED ORDER — SODIUM CHLORIDE 0.9 % IV SOLN
INTRAVENOUS | Status: AC
  Administered 2012-09-20: 19:00:00 via INTRAVENOUS

## 2012-09-20 MED ORDER — SODIUM CHLORIDE 0.9 % IV SOLN
125.0000 mg | Freq: Once | INTRAVENOUS | Status: AC
  Administered 2012-09-20: 125 mg via INTRAVENOUS
  Filled 2012-09-20: qty 10

## 2012-09-20 NOTE — Progress Notes (Signed)
MEDICATION RELATED CONSULT NOTE - INITIAL   Pharmacy Consult for Ferric Gluconate (sub for Venofer) Indication: IDA  No Known Allergies  Patient Measurements: Height: 5' 3.5" (161.3 cm) Weight: 152 lb 1.9 oz (69 kg) IBW/kg (Calculated) : 53.55   Labs:  Recent Labs  09/18/12 2335 09/19/12 0631 09/20/12 0355  WBC 3.6* 2.7* 3.3*  HGB 9.5* 8.1* 8.6*  HCT 30.3* 26.3* 27.7*  PLT 139* 123* 128*  CREATININE 0.50 0.50 0.52  ALBUMIN 2.3* 2.0* 2.1*  PROT 7.3 6.6 6.6  AST 37 26 40*  ALT 17 13 16   ALKPHOS 106 83 74  BILITOT 0.3 0.3 0.4  BILIDIR  --  0.1  --   IBILI  --  0.2*  --    Estimated Creatinine Clearance: 70.6 ml/min (by C-G formula based on Cr of 0.52).   A/P: Patient with iron deficiency anemia and received consult for Venofer per pharmacy dosing. Per sub policy, will sub Nulecit for Venofer. Give standard dose of 125mg  IV x 1. Monitor Hgb   Hessie Knows, PharmD, BCPS Pager [phone] 09/20/2012 8:21 AM

## 2012-09-20 NOTE — Progress Notes (Addendum)
INITIAL NUTRITION ASSESSMENT  DOCUMENTATION CODES Per approved criteria  -Not Applicable   INTERVENTION: - Diet advancement per MD - Resource Breeze BID - Multivitamin 1 tablet PO daily - Will continue to monitor   NUTRITION DIAGNOSIS: Increased nutrient needs related to HIV, hepatitis C, as evidenced by MD notes.   Goal: 1. Resolution of nausea/vomiting/diarrhea 2. Advance diet as tolerated to low sodium diet  Monitor:  Weights, labs, intake, nausea/vomiting/diarrhea  Reason for Assessment: Nutrition risk   62 y.o. female  Admitting Dx: Abdominal pain  ASSESSMENT: Pt with history of HIV and hepatitis C has been experiencing right flank pain nausea and vomiting over the last 2 weeks. Her symptoms has been gradually worsening. Patient has been having diarrhea alternating with constipation for last few months. In the ER labs show that patient has elevated lipase and CT abdomen and pelvis show possibility of pancreatitis and ascending colon mass. Pt with abdominal distention today.   Met with pt who reports having nausea/vomiting/diarrhea for the past 2 weeks. Pt denies any weight loss. Pt reports she was eating 3 meals/day and supplementing her diet with Ensure. Pt denies any vomiting/diarrhea today however c/o nausea this morning when she woke up however it improved later on in the day. Colonoscopy today showed large ascending colon mass. Surgery and oncology have been consulted.    Height: Ht Readings from Last 1 Encounters:  09/19/12 5' 3.5" (1.613 m)    Weight: Wt Readings from Last 1 Encounters:  09/19/12 152 lb 1.9 oz (69 kg)    Ideal Body Weight: 115 lb  % Ideal Body Weight: 132  Wt Readings from Last 10 Encounters:  09/19/12 152 lb 1.9 oz (69 kg)  09/19/12 152 lb 1.9 oz (69 kg)  09/19/12 152 lb 1.9 oz (69 kg)    Usual Body Weight: 152 lb  % Usual Body Weight: 100  BMI:  Body mass index is 26.52 kg/(m^2).  Estimated Nutritional Needs: Kcal:  1850-2100 Protein: 95-105 Fluid: 1.8-2.1L/day  Skin: +1 generalized edema, non-pitting RUE and LUE edema  Diet Order: Clear Liquid  EDUCATION NEEDS: -No education needs identified at this time   Intake/Output Summary (Last 24 hours) at 09/20/12 1623 Last data filed at 09/20/12 1300  Gross per 24 hour  Intake    860 ml  Output    600 ml  Net    260 ml    Last BM: 3/28  Labs:   Recent Labs Lab 09/18/12 2335 09/19/12 0631 09/20/12 0355  NA 135 134* 134*  K 4.2 3.7 3.7  CL 102 103 103  CO2 25 27 22   BUN 8 6 5*  CREATININE 0.50 0.50 0.52  CALCIUM 8.5 7.8* 7.7*  GLUCOSE 113* 93 81    CBG (last 3)  No results found for this basename: GLUCAP,  in the last 72 hours  Scheduled Meds: . clonazePAM  0.5 mg Oral BID  . elvitegravir-cobicistat-emtricitabine-tenofovir  1 tablet Oral Q breakfast  . morphine  15 mg Oral Q12H  . pantoprazole (PROTONIX) IV  40 mg Intravenous Q24H  . piperacillin-tazobactam (ZOSYN)  IV  3.375 g Intravenous Q8H  . sulfamethoxazole-trimethoprim  1 tablet Oral Daily    Continuous Infusions:   Past Medical History  Diagnosis Date  . HIV (human immunodeficiency virus infection)   . Hepatitis C     History reviewed. No pertinent past surgical history.   Levon Hedger MS, RD, LDN [phone] Pager [phone] After Hours Pager

## 2012-09-20 NOTE — Progress Notes (Signed)
INFECTIOUS DISEASE PROGRESS NOTE  ID: Ruth Stephenson is a 62 y.o. female with   Principal Problem:   Abdominal pain Active Problems:   Pancreatitis   Colonic mass   HIV (human immunodeficiency virus infection)   Hepatitis C   Tobacco abuse   Anemia  Subjective: C/o pain  Abtx:  Anti-infectives   Start     Dose/Rate Route Frequency Ordered Stop   09/19/12 1600  sulfamethoxazole-trimethoprim (BACTRIM,SEPTRA) 400-80 MG per tablet 1 tablet     1 tablet Oral Daily 09/19/12 1524     09/19/12 1200  cefoTAXime (CLAFORAN) 1 g in dextrose 5 % 50 mL IVPB     1 g 100 mL/hr over 30 Minutes Intravenous 4 times per day 09/19/12 0912     09/19/12 1000  metroNIDAZOLE (FLAGYL) tablet 500 mg     500 mg Oral 3 times per day 09/19/12 0906     09/19/12 1000  ciprofloxacin (CIPRO) IVPB 400 mg  Status:  Discontinued     400 mg 200 mL/hr over 60 Minutes Intravenous Every 12 hours 09/19/12 0912 09/19/12 0935   09/19/12 0800  elvitegravir-cobicistat-emtricitabine-tenofovir (STRIBILD) 150-150-200-300 MG tablet 1 tablet     1 tablet Oral Daily with breakfast 09/19/12 0512     09/19/12 0600  cefTRIAXone (ROCEPHIN) 1 g in dextrose 5 % 50 mL IVPB  Status:  Discontinued     1 g 100 mL/hr over 30 Minutes Intravenous Every 24 hours 09/19/12 0512 09/19/12 0906      Medications:  Scheduled: . clonazePAM  0.5 mg Oral BID  . elvitegravir-cobicistat-emtricitabine-tenofovir  1 tablet Oral Q breakfast  . ferric gluconate (FERRLECIT/NULECIT) IV  125 mg Intravenous Once  . morphine  15 mg Oral Q12H  . pantoprazole (PROTONIX) IV  40 mg Intravenous Q24H  . sulfamethoxazole-trimethoprim  1 tablet Oral Daily    Objective: Vital signs in last 24 hours: Temp:  [98.1 F (36.7 C)-99.1 F (37.3 C)] 98.8 F (37.1 C) (03/28 1105) Pulse Rate:  [68-86] 70 (03/28 1105) Resp:  [10-31] 16 (03/28 1105) BP: (92-145)/(49-112) 119/65 mmHg (03/28 1105) SpO2:  [95 %-100 %] 98 % (03/28 1105)   General appearance:  fatigued and mild distress Resp: clear to auscultation bilaterally Cardio: regular rate and rhythm GI: normal findings: bowel sounds normal and abnormal findings:  distended and mild tenderness R sided  Lab Results  Recent Labs  09/19/12 0631 09/20/12 0355  WBC 2.7* 3.3*  HGB 8.1* 8.6*  HCT 26.3* 27.7*  NA 134* 134*  K 3.7 3.7  CL 103 103  CO2 27 22  BUN 6 5*  CREATININE 0.50 0.52   Liver Panel  Recent Labs  09/19/12 0631 09/20/12 0355  PROT 6.6 6.6  ALBUMIN 2.0* 2.1*  AST 26 40*  ALT 13 16  ALKPHOS 83 74  BILITOT 0.3 0.4  BILIDIR 0.1  --   IBILI 0.2*  --    Sedimentation Rate No results found for this basename: ESRSEDRATE,  in the last 72 hours C-Reactive Protein No results found for this basename: CRP,  in the last 72 hours  Microbiology: No results found for this or any previous visit (from the past 240 hour(s)).  Studies/Results: US Abdomen Complete  09/19/2012  *RADIOLOGY REPORT*  Clinical Data:  Right upper quadrant pain.  LIMITED ABDOMINAL ULTRASOUND - RIGHT UPPER QUADRANT  Comparison:  CT today.  Findings:  Gallbladder:  There are no gallstones.  No sonographic Murphy's sign.  Gallbladder wall is mildly thickened at 3.7  mm, with normal below the 3 mm.  This is nonspecific in a patient with HIV and could be due to either the patient's history of hepatitis or HIV.  Common bile duct:  Upper limits of normal for age, 6.5 mm.  Mild intrahepatic biliary ductal dilation is also demonstrated on CT.  Liver:  Cirrhotic.  Small amount of perihepatic ascites.  IMPRESSION: Mild gallbladder wall thickening which is nonspecific in this cirrhotic patient.  This could also be associated with HIV cholangiography.  No gallstones or common duct stone identified. Significantly, no sonographic Murphy's sign.                    Original Report Authenticated By: Andreas Newport, M.D.    Ct Abdomen Pelvis W Contrast  09/19/2012  *RADIOLOGY REPORT*  Clinical Data: Abdominal pain.  Right  lower quadrant pain.  Pain radiating to the right flank.  History of HIV and hepatitis C.  CT ABDOMEN AND PELVIS WITH CONTRAST  Technique:  Multidetector CT imaging of the abdomen and pelvis was performed following the standard protocol during bolus administration of intravenous contrast.  Contrast: OMNIPAQUE IOHEXOL 300 MG/ML  SOLN  Comparison: None.  Findings: Lung Bases: Dependent atelectasis.  Linear scarring in the right middle lobe adjacent to the heart.  Liver:  Hepatic cirrhosis.  Diffuse micronodular contour.  No focal mass lesion is identified.  Mild intrahepatic biliary ductal dilation.  Spleen:  Splenomegaly compatible with portal venous hypertension.  Gallbladder:  Grossly normal.  No calcified stones.  Common bile duct:  Normal for age.  Pancreas:  Mild prominence of pancreatic duct.  No focal mass lesions.  The pancreas appears full.  Adrenal glands:  Normal.  Kidneys:  Normal enhancement and excretion.  The ureters appear within normal limits.  Sub centimeter left inferior pole renal cyst.  Stomach:  Decompressed.  Numerous upper abdominal lymph nodes are present, which may be associated with HIV, congestive or reactive. Neoplastic nodes are a consideration.  Index node along the lesser curvature of the stomach measures 14 mm x 33 mm.  Small bowel:  No small bowel obstruction.  No definite mural thickening.  Colon:   Ileocecal valve appears normal.  The appendix is not identified.  Abnormal soft tissue is present in the right pericolic gutter.  There is circumferential mural thickening in the proximal ascending colon, with an element of obstruction likely.  This enhancing mass demonstrates several areas of low attenuation. Differential considerations include neoplasm such as primary colon cancer, lymphoma or colitis.  This is focal and restricted to the ascending colon.  There is no involvement of the transverse or descending colon.  Pelvic Genitourinary:  Grossly within normal limits.  Urinary  bladder normal.  Portal venous collaterals and prominent hemorrhoidal vein.  Bones:  No AVN.  No aggressive osseous lesions.  Spondylosis.  Vasculature: Portal venous hypertension with collaterals in the upper abdomen.  Retroperitoneal collaterals are present as well. Recanalization of the periumbilical vein. Atherosclerosis of the aorta and iliac vessels.  IMPRESSION: 1.  Hepatic cirrhosis with a small amount of ascites, splenomegaly and portal venous hypertension. 2.  Mass in the ascending colon is suspicious for neoplasm.  Focal colitis considered less likely.  In this patient with HIV, colonic lymphoma is a definite consideration.  Adjacent soft tissue stranding extending into the right pericolic gutter raises the possibility of peritoneal spread of disease. 3.  Fullness of the pancreas with prominence of pancreatic duct. Findings suggestive of pancreatitis. 4.  Abdominal lymphadenopathy is nonspecific reactive, congestive, associated with HIV, or neoplastic in this patient with colonic mass.  Critical Value/emergent results were called by telephone at the time of interpretation on 09/19/2012 at 0141 hours to Dr. Manus Gunning, Jeannett Senior, who verbally acknowledged these results. We discussed the ultrasound dated been ordered but would likely add little based on the CT findings.   Original Report Authenticated By: Andreas Newport, M.D.      Assessment/Plan: HIV/AIDS  Hepatitis C (type 1a)  Colonic Mass  Pancreatitis  Tx TB 1980s  Total days of antibiotics: 2 (claforan, flagyl) Change anbx to zosyn Await result of her Bx Onc, surgery consulted? Re-order her GC/chlamydia CD4 150, await VL and genotype  Dr Luciana Axe available if questions over w/e  Johny Sax Infectious Diseases [phone] 09/20/2012, 1:19 PM   LOS: 2 days

## 2012-09-20 NOTE — H&P (View-Only) (Signed)
[facility]                                                                                Patient Demographics  Ruth Stephenson, is a 61 y.o. female, DOB - 02/10/1951, CSN:[id number], MRN:[id number]  Admit date - 09/18/2012  Admitting Physician Arshad N Kakrakandy, MD  Outpatient Primary MD for the patient is No primary provider on file.  LOS - 2   Chief Complaint  Patient presents with  . Abdominal Pain        Assessment & Plan    1. Abdominal pain noted in the right lower quadrant. Patient also has history of HIV, hep C with cirrhosis, CT suggestive of ascending colon mass. She has some tenderness to the right lower quadrant, mass could be malignancy versus infectious, stool cultures, GI pathogen panel PCR, ova parasites ordered, for now Cefotaxime (ascites) and Flagyl, GI called along with ID to evaluate the patient both following the patient, for colonoscopy on 09-20-12.     2. HIV - check viral load and CD4 count is low, on Bactrim per ID. Continue present home HIV medications. Has been out of for HIV medications for the last 1 month since she moved from [place], ID following the patient here.      3. History of hepatitis C - probably has cirrhosis presently per the CAT scan. Consulted GI. Will placed patient on empiric antibiotics as in #1.     4. Anemia - likely anemia of chronic disease plus dilutional fall from IV fluids + Iron deficiency on An panel, stool for occult blood done by the ER physician was negative. Replace IV Venofer, monitor H&H, Type and screen. Protonix IV.     5. Tobacco abuse - advised to quit smoking.     6. Thrombocytopenia. Likely cirrhosis related, we will monitor, we will avoid heparin Lovenox.      Code Status: full  Family Communication: patient  Disposition Plan: home   Procedures CT abd Pelvis, Abd US   Consults  Gi, ID   DVT Prophylaxis    SCDs    Lab Results  Component Value Date   PLT 128*  09/20/2012    Medications  Scheduled Meds: . cefoTAXime (CLAFORAN) IV  1 g Intravenous Q6H  . clonazePAM  0.5 mg Oral BID  . elvitegravir-cobicistat-emtricitabine-tenofovir  1 tablet Oral Q breakfast  . ferric gluconate (FERRLECIT/NULECIT) IV  125 mg Intravenous Once  . metroNIDAZOLE  500 mg Oral Q8H  . morphine  15 mg Oral Q12H  . pantoprazole (PROTONIX) IV  40 mg Intravenous Q24H  . sulfamethoxazole-trimethoprim  1 tablet Oral Daily   Continuous Infusions: . sodium chloride     PRN Meds:.acetaminophen, HYDROmorphone (DILAUDID) injection, ondansetron (ZOFRAN) IV  Antibiotics     Anti-infectives   Start     Dose/Rate Route Frequency Ordered Stop   09/19/12 1600  sulfamethoxazole-trimethoprim (BACTRIM,SEPTRA) 400-80 MG per tablet 1 tablet     1 tablet Oral Daily 09/19/12 1524     09/19/12 1200  cefoTAXime (CLAFORAN) 1 g in dextrose 5 % 50 mL IVPB     1 g 100 mL/hr over 30 Minutes Intravenous 4 times per day   09/19/12 0912     09/19/12 1000  metroNIDAZOLE (FLAGYL) tablet 500 mg     500 mg Oral 3 times per day 09/19/12 0906     09/19/12 1000  ciprofloxacin (CIPRO) IVPB 400 mg  Status:  Discontinued     400 mg 200 mL/hr over 60 Minutes Intravenous Every 12 hours 09/19/12 0912 09/19/12 0935   09/19/12 0800  elvitegravir-cobicistat-emtricitabine-tenofovir (STRIBILD) 150-150-200-300 MG tablet 1 tablet     1 tablet Oral Daily with breakfast 09/19/12 0512     09/19/12 0600  cefTRIAXone (ROCEPHIN) 1 g in dextrose 5 % 50 mL IVPB  Status:  Discontinued     1 g 100 mL/hr over 30 Minutes Intravenous Every 24 hours 09/19/12 0512 09/19/12 0906       Time Spent in minutes   35   SINGH,PRASHANT K M.D on 09/20/2012 at 8:47 AM  Between 7am to 7pm - Pager - [phone]  After 7pm go to [email or web address] - password [email or web address]  And look for the night coverage person covering for me after hours  [facility] Office  [phone]    Subjective:   Ruth Stephenson today has, No  headache, No chest pain, No abdominal pain - No Nausea, No new weakness tingling or numbness, No Cough - SOB.   Objective:   Filed Vitals:   09/19/12 1700 09/19/12 2049 09/20/12 0702 09/20/12 0831  BP: 101/58 103/54 98/55 105/63  Pulse: 77 80 86 73  Temp:  99.1 F (37.3 C) 98.8 F (37.1 C) 98.4 F (36.9 C)  TempSrc:  Oral Oral Oral  Resp:  15 16 14  Height:      Weight:      SpO2:  97% 100% 98%    Wt Readings from Last 3 Encounters:  09/19/12 69 kg (152 lb 1.9 oz)  09/19/12 69 kg (152 lb 1.9 oz)  09/19/12 69 kg (152 lb 1.9 oz)     Intake/Output Summary (Last 24 hours) at 09/20/12 0847 Last data filed at 09/19/12 1718  Gross per 24 hour  Intake    840 ml  Output   1250 ml  Net   -410 ml    Exam Awake Alert, Oriented X 3, No new F.N deficits, Normal affect [place].AT,PERRAL Supple Neck,No JVD, No cervical lymphadenopathy appriciated.  Symmetrical Chest wall movement, Good air movement bilaterally, CTAB RRR,No Gallops,Rubs or new Murmurs, No Parasternal Heave +ve B.Sounds, Abd LESS DISTENDED AND LESS TENDER RLQ, No organomegaly appriciated, No rebound - guarding or rigidity. No Cyanosis, Clubbing or edema, No new Rash or bruise     Data Review   Micro Results No results found for this or any previous visit (from the past 240 hour(s)).  Radiology Reports Us Abdomen Complete  09/19/2012  *RADIOLOGY REPORT*  Clinical Data:  Right upper quadrant pain.  LIMITED ABDOMINAL ULTRASOUND - RIGHT UPPER QUADRANT  Comparison:  CT today.  Findings:  Gallbladder:  There are no gallstones.  No sonographic Murphy's sign.  Gallbladder wall is mildly thickened at 3.7 mm, with normal below the 3 mm.  This is nonspecific in a patient with HIV and could be due to either the patient's history of hepatitis or HIV.  Common bile duct:  Upper limits of normal for age, 6.5 mm.  Mild intrahepatic biliary ductal dilation is also demonstrated on CT.  Liver:  Cirrhotic.  Small amount of perihepatic  ascites.  IMPRESSION: Mild gallbladder wall thickening which is nonspecific in this cirrhotic patient.  This could also be associated with   HIV cholangiography.  No gallstones or common duct stone identified. Significantly, no sonographic Murphy's sign.                    Original Report Authenticated By: Geoffrey Lamke, M.D.    Ct Abdomen Pelvis W Contrast  09/19/2012  *RADIOLOGY REPORT*  Clinical Data: Abdominal pain.  Right lower quadrant pain.  Pain radiating to the right flank.  History of HIV and hepatitis C.  CT ABDOMEN AND PELVIS WITH CONTRAST  Technique:  Multidetector CT imaging of the abdomen and pelvis was performed following the standard protocol during bolus administration of intravenous contrast.  Contrast: 100mL OMNIPAQUE IOHEXOL 300 MG/ML  SOLN  Comparison: None.  Findings: Lung Bases: Dependent atelectasis.  Linear scarring in the right middle lobe adjacent to the heart.  Liver:  Hepatic cirrhosis.  Diffuse micronodular contour.  No focal mass lesion is identified.  Mild intrahepatic biliary ductal dilation.  Spleen:  Splenomegaly compatible with portal venous hypertension.  Gallbladder:  Grossly normal.  No calcified stones.  Common bile duct:  Normal for age.  Pancreas:  Mild prominence of pancreatic duct.  No focal mass lesions.  The pancreas appears full.  Adrenal glands:  Normal.  Kidneys:  Normal enhancement and excretion.  The ureters appear within normal limits.  Sub centimeter left inferior pole renal cyst.  Stomach:  Decompressed.  Numerous upper abdominal lymph nodes are present, which may be associated with HIV, congestive or reactive. Neoplastic nodes are a consideration.  Index node along the lesser curvature of the stomach measures 14 mm x 33 mm.  Small bowel:  No small bowel obstruction.  No definite mural thickening.  Colon:   Ileocecal valve appears normal.  The appendix is not identified.  Abnormal soft tissue is present in the right pericolic gutter.  There is circumferential  mural thickening in the proximal ascending colon, with an element of obstruction likely.  This enhancing mass demonstrates several areas of low attenuation. Differential considerations include neoplasm such as primary colon cancer, lymphoma or colitis.  This is focal and restricted to the ascending colon.  There is no involvement of the transverse or descending colon.  Pelvic Genitourinary:  Grossly within normal limits.  Urinary bladder normal.  Portal venous collaterals and prominent hemorrhoidal vein.  Bones:  No AVN.  No aggressive osseous lesions.  Spondylosis.  Vasculature: Portal venous hypertension with collaterals in the upper abdomen.  Retroperitoneal collaterals are present as well. Recanalization of the periumbilical vein. Atherosclerosis of the aorta and iliac vessels.  IMPRESSION: 1.  Hepatic cirrhosis with a small amount of ascites, splenomegaly and portal venous hypertension. 2.  Mass in the ascending colon is suspicious for neoplasm.  Focal colitis considered less likely.  In this patient with HIV, colonic lymphoma is a definite consideration.  Adjacent soft tissue stranding extending into the right pericolic gutter raises the possibility of peritoneal spread of disease. 3.  Fullness of the pancreas with prominence of pancreatic duct. Findings suggestive of pancreatitis. 4.  Abdominal lymphadenopathy is nonspecific reactive, congestive, associated with HIV, or neoplastic in this patient with colonic mass.  Critical Value/emergent results were called by telephone at the time of interpretation on 09/19/2012 at 0141 hours to Dr. RANCOUR, STEPHEN, who verbally acknowledged these results. We discussed the ultrasound dated been ordered but would likely add little based on the CT findings.   Original Report Authenticated By: Geoffrey Lamke, M.D.     CBC  Recent Labs Lab 09/18/12 2335 09/19/12 0631 09/20/12   0355  WBC 3.6* 2.7* 3.3*  HGB 9.5* 8.1* 8.6*  HCT 30.3* 26.3* 27.7*  PLT 139* 123* 128*   MCV 79.3 79.5 79.6  MCH 24.9* 24.5* 24.7*  MCHC 31.4 30.8 31.0  RDW 15.3 15.1 15.4  LYMPHSABS 0.7 0.9  --   MONOABS 0.4 0.3  --   EOSABS 0.0 0.0  --   BASOSABS 0.0 0.0  --     Chemistries   Recent Labs Lab 09/18/12 2335 09/19/12 0631 09/20/12 0355  NA 135 134* 134*  K 4.2 3.7 3.7  CL 102 103 103  CO2 25 27 22  GLUCOSE 113* 93 81  BUN 8 6 5*  CREATININE 0.50 0.50 0.52  CALCIUM 8.5 7.8* 7.7*  AST 37 26 40*  ALT 17 13 16  ALKPHOS 106 83 74  BILITOT 0.3 0.3 0.4   ------------------------------------------------------------------------------------------------------------------ estimated creatinine clearance is 70.6 ml/min (by C-G formula based on Cr of 0.52). ------------------------------------------------------------------------------------------------------------------ No results found for this basename: HGBA1C,  in the last 72 hours ------------------------------------------------------------------------------------------------------------------  Recent Labs  09/19/12 0631  CHOL 63  HDL 14*  LDLCALC 43  TRIG 32  CHOLHDL 4.5   ------------------------------------------------------------------------------------------------------------------ No results found for this basename: TSH, T4TOTAL, FREET3, T3FREE, THYROIDAB,  in the last 72 hours ------------------------------------------------------------------------------------------------------------------  Recent Labs  09/19/12 0631  VITAMINB12 1100*  FOLATE 15.1  FERRITIN 10  TIBC 342  IRON 14*  RETICCTPCT 1.3    Coagulation profile  Recent Labs Lab 09/18/12 2335  INR 1.10    No results found for this basename: DDIMER,  in the last 72 hours  Cardiac Enzymes No results found for this basename: CK, CKMB, TROPONINI, MYOGLOBIN,  in the last 168 hours ------------------------------------------------------------------------------------------------------------------ No components found with this basename:  POCBNP,     

## 2012-09-20 NOTE — Progress Notes (Signed)
Called [facility] will see the pt soon  Called [facility] they want to be called in am.

## 2012-09-20 NOTE — Consult Note (Signed)
Reason for Consult:  Ascending colon mass Referring Physician:   Dr. Margaretmary Dys is an 62 y.o. female.  HPI:  She was admitted to the hospital 2 days ago.  She complained of right flank pain with nausea and vomiting for about 2 weeks. She had some diarrhea. She stated that one year ago she had a lower GI bleed. This was in [place]. She had an intensive colonoscopy but it was unsuccessful. After that time she was afraid to have a repeat colonoscopy in did not pursue any further evaluation.  Upon admission she was noted to have an elevated lipase as well as anemia. A CT scan of the abdomen and pelvis demonstrated some findings consistent with acute appendicitis. There is also an inflammatory ascending colon mass with some surrounding enlarged lymph nodes.  A colonoscopy today demonstrated a neoplastic lesion that was friable. It was partially obstructing. Biopsies were performed. We were asked to see her because of this. She states she has more abdominal distention. She has been tolerating a liquid diet and did tolerate the bowel prep.  Past Medical History  Diagnosis Date  . HIV (human immunodeficiency virus infection)   . Hepatitis C     History reviewed. No pertinent past surgical history.  Family History  Problem Relation Age of Onset  . Cancer Mother     Social History:  reports that she has been smoking Cigarettes.  She has been smoking about 0.50 packs per day. She does not have any smokeless tobacco history on file. She reports that she does not drink alcohol or use illicit drugs.  Allergies: No Known Allergies  Current facility-administered medications:0.9 %  sodium chloride infusion, , Intravenous, Continuous, Leroy Sea, MD, Last Rate: 30 mL/hr at 09/20/12 1833;  acetaminophen (TYLENOL) tablet 500 mg, 500 mg, Oral, Q12H PRN, Leroy Sea, MD;  clonazePAM Scarlette Calico) tablet 0.5 mg, 0.5 mg, Oral, BID, Leroy Sea, MD, 0.5 mg at 09/20/12  1346 elvitegravir-cobicistat-emtricitabine-tenofovir (STRIBILD) 150-150-200-300 MG tablet 1 tablet, 1 tablet, Oral, Q breakfast, Eduard Clos, MD, 1 tablet at 09/20/12 1345;  [START ON 09/21/2012] feeding supplement (RESOURCE BREEZE) liquid 1 Container, 1 Container, Oral, BID BM, Lavena Bullion, RD;  HYDROmorphone (DILAUDID) injection 2 mg, 2 mg, Intravenous, Q2H PRN, Leroy Sea, MD, 2 mg at 09/20/12 1831 morphine (MS CONTIN) 12 hr tablet 15 mg, 15 mg, Oral, Q12H, Leroy Sea, MD, 15 mg at 09/20/12 1346;  multivitamin with minerals tablet 1 tablet, 1 tablet, Oral, Daily, Lavena Bullion, RD;  ondansetron (ZOFRAN) injection 4 mg, 4 mg, Intravenous, Q6H PRN, Eduard Clos, MD;  pantoprazole (PROTONIX) injection 40 mg, 40 mg, Intravenous, Q24H, Eduard Clos, MD, 40 mg at 09/20/12 0401 piperacillin-tazobactam (ZOSYN) IVPB 3.375 g, 3.375 g, Intravenous, Q8H, [name], MD, 3.375 g at 09/20/12 1534;  sulfamethoxazole-trimethoprim (BACTRIM,SEPTRA) 400-80 MG per tablet 1 tablet, 1 tablet, Oral, Daily, [name], MD, 1 tablet at 09/20/12 1346  Results for orders placed during the hospital encounter of 09/18/12 (from the past 48 hour(s))  CBC WITH DIFFERENTIAL     Status: Abnormal   Collection Time    09/18/12 11:35 PM      Result Value Range   WBC 3.6 (*) 4.0 - 10.5 K/uL   RBC 3.82 (*) 3.87 - 5.11 MIL/uL   Hemoglobin 9.5 (*) 12.0 - 15.0 g/dL   HCT 16.1 (*) 09.6 - 04.5 %   MCV 79.3  78.0 - 100.0 fL  MCH 24.9 (*) 26.0 - 34.0 pg   MCHC 31.4  30.0 - 36.0 g/dL   RDW 16.1  09.6 - 04.5 %   Platelets 139 (*) 150 - 400 K/uL   Neutrophils Relative 69  43 - 77 %   Neutro Abs 2.5  1.7 - 7.7 K/uL   Lymphocytes Relative 20  12 - 46 %   Lymphs Abs 0.7  0.7 - 4.0 K/uL   Monocytes Relative 10  3 - 12 %   Monocytes Absolute 0.4  0.1 - 1.0 K/uL   Eosinophils Relative 0  0 - 5 %   Eosinophils Absolute 0.0  0.0 - 0.7 K/uL   Basophils Relative 0  0 - 1 %   Basophils  Absolute 0.0  0.0 - 0.1 K/uL  COMPREHENSIVE METABOLIC PANEL     Status: Abnormal   Collection Time    09/18/12 11:35 PM      Result Value Range   Sodium 135  135 - 145 mEq/L   Potassium 4.2  3.5 - 5.1 mEq/L   Chloride 102  96 - 112 mEq/L   CO2 25  19 - 32 mEq/L   Glucose, Bld 113 (*) 70 - 99 mg/dL   BUN 8  6 - 23 mg/dL   Creatinine, Ser 4.09  0.50 - 1.10 mg/dL   Calcium 8.5  8.4 - 81.1 mg/dL   Total Protein 7.3  6.0 - 8.3 g/dL   Albumin 2.3 (*) 3.5 - 5.2 g/dL   AST 37  0 - 37 U/L   ALT 17  0 - 35 U/L   Alkaline Phosphatase 106  39 - 117 U/L   Total Bilirubin 0.3  0.3 - 1.2 mg/dL   GFR calc non Af Amer >90  >90 mL/min   GFR calc Af Amer >90  >90 mL/min   Comment:            The eGFR has been calculated     using the CKD EPI equation.     This calculation has not been     validated in all clinical     situations.     eGFR's persistently     <90 mL/min signify     possible Chronic Kidney Disease.  LIPASE, BLOOD     Status: Abnormal   Collection Time    09/18/12 11:35 PM      Result Value Range   Lipase 99 (*) 11 - 59 U/L  LACTIC ACID, PLASMA     Status: None   Collection Time    09/18/12 11:35 PM      Result Value Range   Lactic Acid, Venous 1.7  0.5 - 2.2 mmol/L  PROTIME-INR     Status: None   Collection Time    09/18/12 11:35 PM      Result Value Range   Prothrombin Time 14.1  11.6 - 15.2 seconds   INR 1.10  0.00 - 1.49  URINALYSIS, ROUTINE W REFLEX MICROSCOPIC     Status: None   Collection Time    09/19/12 12:57 AM      Result Value Range   Color, Urine YELLOW  YELLOW   APPearance CLEAR  CLEAR   Specific Gravity, Urine 1.011  1.005 - 1.030   pH 6.0  5.0 - 8.0   Glucose, UA NEGATIVE  NEGATIVE mg/dL   Hgb urine dipstick NEGATIVE  NEGATIVE   Bilirubin Urine NEGATIVE  NEGATIVE   Ketones, ur NEGATIVE  NEGATIVE mg/dL   Protein,  ur NEGATIVE  NEGATIVE mg/dL   Urobilinogen, UA 1.0  0.0 - 1.0 mg/dL   Nitrite NEGATIVE  NEGATIVE   Leukocytes, UA NEGATIVE  NEGATIVE    Comment: MICROSCOPIC NOT DONE ON URINES WITH NEGATIVE PROTEIN, BLOOD, LEUKOCYTES, NITRITE, OR GLUCOSE <1000 mg/dL.  LIPID PANEL     Status: Abnormal   Collection Time    09/19/12  6:31 AM      Result Value Range   Cholesterol 63  0 - 200 mg/dL   Triglycerides 32  <161 mg/dL   HDL 14 (*) >09 mg/dL   Total CHOL/HDL Ratio 4.5     VLDL 6  0 - 40 mg/dL   LDL Cholesterol 43  0 - 99 mg/dL   Comment:            Total Cholesterol/HDL:CHD Risk     Coronary Heart Disease Risk Table                         Men   Women      1/2 Average Risk   3.4   3.3      Average Risk       5.0   4.4      2 X Average Risk   9.6   7.1      3 X Average Risk  23.4   11.0                Use the calculated Patient Ratio     above and the CHD Risk Table     to determine the patient's CHD Risk.                ATP III CLASSIFICATION (LDL):      <100     mg/dL   Optimal      604-540  mg/dL   Near or Above                        Optimal      130-159  mg/dL   Borderline      981-191  mg/dL   High      >478     mg/dL   Very High  BASIC METABOLIC PANEL     Status: Abnormal   Collection Time    09/19/12  6:31 AM      Result Value Range   Sodium 134 (*) 135 - 145 mEq/L   Potassium 3.7  3.5 - 5.1 mEq/L   Chloride 103  96 - 112 mEq/L   CO2 27  19 - 32 mEq/L   Glucose, Bld 93  70 - 99 mg/dL   BUN 6  6 - 23 mg/dL   Creatinine, Ser 2.95  0.50 - 1.10 mg/dL   Calcium 7.8 (*) 8.4 - 10.5 mg/dL   GFR calc non Af Amer >90  >90 mL/min   GFR calc Af Amer >90  >90 mL/min   Comment:            The eGFR has been calculated     using the CKD EPI equation.     This calculation has not been     validated in all clinical     situations.     eGFR's persistently     <90 mL/min signify     possible Chronic Kidney Disease.  HEPATIC FUNCTION PANEL     Status: Abnormal   Collection Time    09/19/12  6:31 AM      Result Value Range   Total Protein 6.6  6.0 - 8.3 g/dL   Albumin 2.0 (*) 3.5 - 5.2 g/dL   AST 26  0 - 37 U/L    ALT 13  0 - 35 U/L   Alkaline Phosphatase 83  39 - 117 U/L   Total Bilirubin 0.3  0.3 - 1.2 mg/dL   Bilirubin, Direct 0.1  0.0 - 0.3 mg/dL   Indirect Bilirubin 0.2 (*) 0.3 - 0.9 mg/dL  CBC WITH DIFFERENTIAL     Status: Abnormal   Collection Time    09/19/12  6:31 AM      Result Value Range   WBC 2.7 (*) 4.0 - 10.5 K/uL   RBC 3.31 (*) 3.87 - 5.11 MIL/uL   Hemoglobin 8.1 (*) 12.0 - 15.0 g/dL   HCT 14.7 (*) 82.9 - 56.2 %   MCV 79.5  78.0 - 100.0 fL   MCH 24.5 (*) 26.0 - 34.0 pg   MCHC 30.8  30.0 - 36.0 g/dL   RDW 13.0  86.5 - 78.4 %   Platelets 123 (*) 150 - 400 K/uL   Neutrophils Relative 53  43 - 77 %   Neutro Abs 1.4 (*) 1.7 - 7.7 K/uL   Lymphocytes Relative 34  12 - 46 %   Lymphs Abs 0.9  0.7 - 4.0 K/uL   Monocytes Relative 12  3 - 12 %   Monocytes Absolute 0.3  0.1 - 1.0 K/uL   Eosinophils Relative 0  0 - 5 %   Eosinophils Absolute 0.0  0.0 - 0.7 K/uL   Basophils Relative 0  0 - 1 %   Basophils Absolute 0.0  0.0 - 0.1 K/uL  T-HELPER CELLS (CD4) COUNT     Status: Abnormal   Collection Time    09/19/12  6:31 AM      Result Value Range   CD4 T Cell Abs 150 (*) 400 - 2700 cmm   CD4 % Helper T Cell 16 (*) 33 - 55 %  LIPASE, BLOOD     Status: Abnormal   Collection Time    09/19/12  6:31 AM      Result Value Range   Lipase 80 (*) 11 - 59 U/L  TYPE AND SCREEN     Status: None   Collection Time    09/19/12  6:31 AM      Result Value Range   ABO/RH(D) A POS     Antibody Screen NEG     Sample Expiration 09/22/2012    VITAMIN B12     Status: Abnormal   Collection Time    09/19/12  6:31 AM      Result Value Range   Vitamin B-12 1100 (*) 211 - 911 pg/mL  FOLATE     Status: None   Collection Time    09/19/12  6:31 AM      Result Value Range   Folate 15.1     Comment: (NOTE)     Reference Ranges            Deficient:       0.4 - 3.3 ng/mL            Indeterminate:   3.4 - 5.4 ng/mL            Normal:              > 5.4 ng/mL  IRON AND TIBC     Status: Abnormal  Collection Time    09/19/12  6:31 AM      Result Value Range   Iron 14 (*) 42 - 135 ug/dL   TIBC 098  119 - 147 ug/dL   Saturation Ratios 4 (*) 20 - 55 %   UIBC 328  125 - 400 ug/dL  FERRITIN     Status: None   Collection Time    09/19/12  6:31 AM      Result Value Range   Ferritin 10  10 - 291 ng/mL  RETICULOCYTES     Status: Abnormal   Collection Time    09/19/12  6:31 AM      Result Value Range   Retic Ct Pct 1.3  0.4 - 3.1 %   RBC. 3.31 (*) 3.87 - 5.11 MIL/uL   Retic Count, Manual 43.0  19.0 - 186.0 K/uL  ABO/RH     Status: None   Collection Time    09/19/12  6:31 AM      Result Value Range   ABO/RH(D) A POS    RPR     Status: None   Collection Time    09/19/12  3:56 PM      Result Value Range   RPR NON REACTIVE  NON REACTIVE  HEPATITIS A ANTIBODY, TOTAL     Status: Abnormal   Collection Time    09/19/12  3:56 PM      Result Value Range   Hep A Total Ab POSITIVE (*) NEGATIVE  CBC     Status: Abnormal   Collection Time    09/20/12  3:55 AM      Result Value Range   WBC 3.3 (*) 4.0 - 10.5 K/uL   RBC 3.48 (*) 3.87 - 5.11 MIL/uL   Hemoglobin 8.6 (*) 12.0 - 15.0 g/dL   HCT 82.9 (*) 56.2 - 13.0 %   MCV 79.6  78.0 - 100.0 fL   MCH 24.7 (*) 26.0 - 34.0 pg   MCHC 31.0  30.0 - 36.0 g/dL   RDW 86.5  78.4 - 69.6 %   Platelets 128 (*) 150 - 400 K/uL  COMPREHENSIVE METABOLIC PANEL     Status: Abnormal   Collection Time    09/20/12  3:55 AM      Result Value Range   Sodium 134 (*) 135 - 145 mEq/L   Potassium 3.7  3.5 - 5.1 mEq/L   Chloride 103  96 - 112 mEq/L   CO2 22  19 - 32 mEq/L   Glucose, Bld 81  70 - 99 mg/dL   BUN 5 (*) 6 - 23 mg/dL   Creatinine, Ser 2.95  0.50 - 1.10 mg/dL   Calcium 7.7 (*) 8.4 - 10.5 mg/dL   Total Protein 6.6  6.0 - 8.3 g/dL   Albumin 2.1 (*) 3.5 - 5.2 g/dL   AST 40 (*) 0 - 37 U/L   ALT 16  0 - 35 U/L   Alkaline Phosphatase 74  39 - 117 U/L   Total Bilirubin 0.4  0.3 - 1.2 mg/dL   GFR calc non Af Amer >90  >90 mL/min   GFR calc Af Amer  >90  >90 mL/min   Comment:            The eGFR has been calculated     using the CKD EPI equation.     This calculation has not been     validated in all clinical     situations.  eGFR's persistently     <90 mL/min signify     possible Chronic Kidney Disease.    US Abdomen Complete  09/19/2012  *RADIOLOGY REPORT*  Clinical Data:  Right upper quadrant pain.  LIMITED ABDOMINAL ULTRASOUND - RIGHT UPPER QUADRANT  Comparison:  CT today.  Findings:  Gallbladder:  There are no gallstones.  No sonographic Murphy's sign.  Gallbladder wall is mildly thickened at 3.7 mm, with normal below the 3 mm.  This is nonspecific in a patient with HIV and could be due to either the patient's history of hepatitis or HIV.  Common bile duct:  Upper limits of normal for age, 6.5 mm.  Mild intrahepatic biliary ductal dilation is also demonstrated on CT.  Liver:  Cirrhotic.  Small amount of perihepatic ascites.  IMPRESSION: Mild gallbladder wall thickening which is nonspecific in this cirrhotic patient.  This could also be associated with HIV cholangiography.  No gallstones or common duct stone identified. Significantly, no sonographic Murphy's sign.                    Original Report Authenticated By: Andreas Newport, M.D.    Ct Abdomen Pelvis W Contrast  09/19/2012  *RADIOLOGY REPORT*  Clinical Data: Abdominal pain.  Right lower quadrant pain.  Pain radiating to the right flank.  History of HIV and hepatitis C.  CT ABDOMEN AND PELVIS WITH CONTRAST  Technique:  Multidetector CT imaging of the abdomen and pelvis was performed following the standard protocol during bolus administration of intravenous contrast.  Contrast: OMNIPAQUE IOHEXOL 300 MG/ML  SOLN  Comparison: None.  Findings: Lung Bases: Dependent atelectasis.  Linear scarring in the right middle lobe adjacent to the heart.  Liver:  Hepatic cirrhosis.  Diffuse micronodular contour.  No focal mass lesion is identified.  Mild intrahepatic biliary ductal dilation.   Spleen:  Splenomegaly compatible with portal venous hypertension.  Gallbladder:  Grossly normal.  No calcified stones.  Common bile duct:  Normal for age.  Pancreas:  Mild prominence of pancreatic duct.  No focal mass lesions.  The pancreas appears full.  Adrenal glands:  Normal.  Kidneys:  Normal enhancement and excretion.  The ureters appear within normal limits.  Sub centimeter left inferior pole renal cyst.  Stomach:  Decompressed.  Numerous upper abdominal lymph nodes are present, which may be associated with HIV, congestive or reactive. Neoplastic nodes are a consideration.  Index node along the lesser curvature of the stomach measures 14 mm x 33 mm.  Small bowel:  No small bowel obstruction.  No definite mural thickening.  Colon:   Ileocecal valve appears normal.  The appendix is not identified.  Abnormal soft tissue is present in the right pericolic gutter.  There is circumferential mural thickening in the proximal ascending colon, with an element of obstruction likely.  This enhancing mass demonstrates several areas of low attenuation. Differential considerations include neoplasm such as primary colon cancer, lymphoma or colitis.  This is focal and restricted to the ascending colon.  There is no involvement of the transverse or descending colon.  Pelvic Genitourinary:  Grossly within normal limits.  Urinary bladder normal.  Portal venous collaterals and prominent hemorrhoidal vein.  Bones:  No AVN.  No aggressive osseous lesions.  Spondylosis.  Vasculature: Portal venous hypertension with collaterals in the upper abdomen.  Retroperitoneal collaterals are present as well. Recanalization of the periumbilical vein. Atherosclerosis of the aorta and iliac vessels.  IMPRESSION: 1.  Hepatic cirrhosis with a small amount of ascites,  splenomegaly and portal venous hypertension. 2.  Mass in the ascending colon is suspicious for neoplasm.  Focal colitis considered less likely.  In this patient with HIV, colonic  lymphoma is a definite consideration.  Adjacent soft tissue stranding extending into the right pericolic gutter raises the possibility of peritoneal spread of disease. 3.  Fullness of the pancreas with prominence of pancreatic duct. Findings suggestive of pancreatitis. 4.  Abdominal lymphadenopathy is nonspecific reactive, congestive, associated with HIV, or neoplastic in this patient with colonic mass.  Critical Value/emergent results were called by telephone at the time of interpretation on 09/19/2012 at 0141 hours to Dr. Manus Gunning, Jeannett Senior, who verbally acknowledged these results. We discussed the ultrasound dated been ordered but would likely add little based on the CT findings.   Original Report Authenticated By: Andreas Newport, M.D.     Review of Systems  Constitutional: Negative for fever and chills.  Gastrointestinal: Positive for nausea, vomiting, abdominal pain, diarrhea and constipation.  Skin: Positive for rash (On face).   Blood pressure 89/50, pulse 80, temperature 99.8 F (37.7 C), temperature source Oral, resp. rate 15, height 5' 3.5" (1.613 m), weight 152 lb 1.9 oz (69 kg), SpO2 97.00%. Physical Exam  Constitutional: She appears well-developed and well-nourished. No distress.  HENT:  Head: Normocephalic and atraumatic.  Neck: Neck supple.  Cardiovascular: Normal rate and regular rhythm.   Respiratory: Effort normal and breath sounds normal.  GI: Soft. She exhibits distension. She exhibits no mass. There is tenderness (Right lateral abdominal  area. Also in the epigastrium.).  Musculoskeletal: She exhibits edema.  Lymphadenopathy:    She has no cervical adenopathy.  Neurological: She is alert.  Skin: Skin is warm and dry.    Assessment/Plan: 1. 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 Colonoscopy and biopsy has been done today.  2.  Hepatitis C.  3. Cirrhosis with evidence of portal hypertension on CT scan.  4. HIV  5. Malnutrition  6.  Mild acute pancreatitis  7. Infectious disease workup in process.  Plan: Check CEA level. Full liquid diet for now. She eventually will need a partial colectomy and we discussed this. This will likely be done sometime early next week. Given her other comorbidities, her risk of complications is increased. She understands this.  I have explained the procedure and risks of colon resection.  Risks include but are not limited to bleeding, infection, wound problems, anesthesia, anastomotic leak, need for colostomy, injury to intraabominal organs (such as intestine, spleen, kidney, bladder, ureter, etc.).  Simren Popson J 09/20/2012, 9:44 PM

## 2012-09-20 NOTE — Op Note (Signed)
[facility] [address] [place], [address]   OPERATIVE PROCEDURE REPORT  PATIENT: Ruth Stephenson, Ruth Stephenson  MR#: [id number] BIRTHDATE: 07-Apr-1951  GENDER: Female ENDOSCOPIST: Jeani Hawking, MD ASSISTANT:   Kandice Robinsons, technician Felecia Shelling, RN PROCEDURE DATE: 09/20/2012 PROCEDURE:   Colonoscopy with biopsy ASA CLASS:   Class III INDICATIONS:Ascending colon mass. MEDICATIONS: Versed 7 mg IV, Fentanyl 75 mcg IV, and Benadryl 50 mg IV  DESCRIPTION OF PROCEDURE:   After the risks benefits and alternatives of the procedure were thoroughly explained, informed consent was obtained.  A digital rectal exam revealed no abnormalities of the rectum.    The     endoscope was introduced through the anus  and advanced to the ascending colon , No adverse events experienced.    The quality of the prep was good. .  The instrument was then slowly withdrawn as the colon was fully examined.     FINDINGS: An ascending colon mass was found.  It was circumfirential and nearly obstructing.  The mucosa was extremely friable. Multiple cold biopsies were obtained and the distal portion of the mass was tattooed with SPOT.  I attemtped to traverse the mass and I was able to advance 4-5 cm, but beyond that point I was not certain about the course of the lumen.  I did not push the colonoscope any further.   Retroflexed views revealed internal/external hemorrhoids.     The scope was then withdrawn from the patient and the procedure terminated.  COMPLICATIONS: There were no complications.  IMPRESSION: 1) Large ascending colon mass s/p tattoo with SPOT.  RECOMMENDATIONS: 1) Await biopsy results. 2) Surgical consultation presuming that this is an adenocarcinoma. 3) Oncology consultation. 4) She appears to have radiographic evidence of cirrhosis and she would classify as a Child's Class A.  Her surgical risk is acceptable.  _______________________________ eSigned:  Jeani Hawking, MD  09/20/2012 10:01 AM

## 2012-09-20 NOTE — Care Management (Signed)
Cm spoke with patient concerning CM consult for [facility] services. Pt states living at home with grand-daughter who assist with home care. Pt states independent prior to hospitalization. Pt states "I am able to care for myself".No PCP on record. Pt provided with PCP list & other community resources. No other needs stated.   Roxy Manns Lucianna Ostlund,RN,BSN [phone]

## 2012-09-20 NOTE — Progress Notes (Signed)
[facility]                                                                                Patient Demographics  Ruth Stephenson, is a 62 y.o. female, DOB - [birth year], ZOX:[id number], WJX:[id number]  Admit date - 09/18/2012  Admitting Physician Eduard Clos, MD  Outpatient Primary MD for the patient is No primary provider on file.  LOS - 2   Chief Complaint  Patient presents with  . Abdominal Pain        Assessment & Plan    1. Abdominal pain noted in the right lower quadrant. Patient also has history of HIV, hep C with cirrhosis, CT suggestive of ascending colon mass. She has some tenderness to the right lower quadrant, mass could be malignancy versus infectious, stool cultures, GI pathogen panel PCR, ova parasites ordered, for now Cefotaxime (ascites) and Flagyl, GI called along with ID to evaluate the patient both following the patient, for colonoscopy on 09-20-12.     2. HIV - check viral load and CD4 count is low, on Bactrim per ID. Continue present home HIV medications. Has been out of for HIV medications for the last 1 month since she moved from [place], ID following the patient here.      3. History of hepatitis C - probably has cirrhosis presently per the CAT scan. Consulted GI. Will placed patient on empiric antibiotics as in #1.     4. Anemia - likely anemia of chronic disease plus dilutional fall from IV fluids + Iron deficiency on An panel, stool for occult blood done by the ER physician was negative. Replace IV Venofer, monitor H&H, Type and screen. Protonix IV.     5. Tobacco abuse - advised to quit smoking.     6. Thrombocytopenia. Likely cirrhosis related, we will monitor, we will avoid heparin Lovenox.      Code Status: full  Family Communication: patient  Disposition Plan: home   Procedures CT abd Pelvis, Abd [place]   Consults  Gi, ID   DVT Prophylaxis    SCDs    Lab Results  Component Value Date   PLT 128*  09/20/2012    Medications  Scheduled Meds: . cefoTAXime (CLAFORAN) IV  1 g Intravenous Q6H  . clonazePAM  0.5 mg Oral BID  . elvitegravir-cobicistat-emtricitabine-tenofovir  1 tablet Oral Q breakfast  . ferric gluconate (FERRLECIT/NULECIT) IV  125 mg Intravenous Once  . metroNIDAZOLE  500 mg Oral Q8H  . morphine  15 mg Oral Q12H  . pantoprazole (PROTONIX) IV  40 mg Intravenous Q24H  . sulfamethoxazole-trimethoprim  1 tablet Oral Daily   Continuous Infusions: . sodium chloride     PRN Meds:.acetaminophen, HYDROmorphone (DILAUDID) injection, ondansetron (ZOFRAN) IV  Antibiotics     Anti-infectives   Start     Dose/Rate Route Frequency Ordered Stop   09/19/12 1600  sulfamethoxazole-trimethoprim (BACTRIM,SEPTRA) 400-80 MG per tablet 1 tablet     1 tablet Oral Daily 09/19/12 1524     09/19/12 1200  cefoTAXime (CLAFORAN) 1 g in dextrose 5 % 50 mL IVPB     1 g 100 mL/hr over 30 Minutes Intravenous 4 times per day  09/19/12 0912     09/19/12 1000  metroNIDAZOLE (FLAGYL) tablet 500 mg     500 mg Oral 3 times per day 09/19/12 0906     09/19/12 1000  ciprofloxacin (CIPRO) IVPB 400 mg  Status:  Discontinued     400 mg 200 mL/hr over 60 Minutes Intravenous Every 12 hours 09/19/12 0912 09/19/12 0935   09/19/12 0800  elvitegravir-cobicistat-emtricitabine-tenofovir (STRIBILD) 150-150-200-300 MG tablet 1 tablet     1 tablet Oral Daily with breakfast 09/19/12 0512     09/19/12 0600  cefTRIAXone (ROCEPHIN) 1 g in dextrose 5 % 50 mL IVPB  Status:  Discontinued     1 g 100 mL/hr over 30 Minutes Intravenous Every 24 hours 09/19/12 0512 09/19/12 0906       Time Spent in minutes   35   SINGH,PRASHANT K M.D on 09/20/2012 at 8:47 AM  Between 7am to 7pm - Pager - [phone]  After 7pm go to [email or web address] - password [email or web address]  And look for the night coverage person covering for me after hours  [facility] Office  [phone]    Subjective:   [name] today has, No  headache, No chest pain, No abdominal pain - No Nausea, No new weakness tingling or numbness, No Cough - SOB.   Objective:   Filed Vitals:   09/19/12 1700 09/19/12 2049 09/20/12 0702 09/20/12 0831  BP: 101/58 103/54 98/55 105/63  Pulse: 77 80 86 73  Temp:  99.1 F (37.3 C) 98.8 F (37.1 C) 98.4 F (36.9 C)  TempSrc:  Oral Oral Oral  Resp:  15 16 14   Height:      Weight:      SpO2:  97% 100% 98%    Wt Readings from Last 3 Encounters:  09/19/12 69 kg (152 lb 1.9 oz)  09/19/12 69 kg (152 lb 1.9 oz)  09/19/12 69 kg (152 lb 1.9 oz)     Intake/Output Summary (Last 24 hours) at 09/20/12 0847 Last data filed at 09/19/12 1718  Gross per 24 hour  Intake    840 ml  Output   1250 ml  Net   -410 ml    Exam Awake Alert, Oriented X 3, No new F.N deficits, Normal affect [place].AT,PERRAL Supple Neck,No JVD, No cervical lymphadenopathy appriciated.  Symmetrical Chest wall movement, Good air movement bilaterally, CTAB RRR,No Gallops,Rubs or new Murmurs, No Parasternal Heave +ve B.Sounds, Abd LESS DISTENDED AND LESS TENDER RLQ, No organomegaly appriciated, No rebound - guarding or rigidity. No Cyanosis, Clubbing or edema, No new Rash or bruise     Data Review   Micro Results No results found for this or any previous visit (from the past 240 hour(s)).  Radiology Reports US Abdomen Complete  09/19/2012  *RADIOLOGY REPORT*  Clinical Data:  Right upper quadrant pain.  LIMITED ABDOMINAL ULTRASOUND - RIGHT UPPER QUADRANT  Comparison:  CT today.  Findings:  Gallbladder:  There are no gallstones.  No sonographic Murphy's sign.  Gallbladder wall is mildly thickened at 3.7 mm, with normal below the 3 mm.  This is nonspecific in a patient with HIV and could be due to either the patient's history of hepatitis or HIV.  Common bile duct:  Upper limits of normal for age, 6.5 mm.  Mild intrahepatic biliary ductal dilation is also demonstrated on CT.  Liver:  Cirrhotic.  Small amount of perihepatic  ascites.  IMPRESSION: Mild gallbladder wall thickening which is nonspecific in this cirrhotic patient.  This could also be associated with  HIV cholangiography.  No gallstones or common duct stone identified. Significantly, no sonographic Murphy's sign.                    Original Report Authenticated By: Andreas Newport, M.D.    Ct Abdomen Pelvis W Contrast  09/19/2012  *RADIOLOGY REPORT*  Clinical Data: Abdominal pain.  Right lower quadrant pain.  Pain radiating to the right flank.  History of HIV and hepatitis C.  CT ABDOMEN AND PELVIS WITH CONTRAST  Technique:  Multidetector CT imaging of the abdomen and pelvis was performed following the standard protocol during bolus administration of intravenous contrast.  Contrast: OMNIPAQUE IOHEXOL 300 MG/ML  SOLN  Comparison: None.  Findings: Lung Bases: Dependent atelectasis.  Linear scarring in the right middle lobe adjacent to the heart.  Liver:  Hepatic cirrhosis.  Diffuse micronodular contour.  No focal mass lesion is identified.  Mild intrahepatic biliary ductal dilation.  Spleen:  Splenomegaly compatible with portal venous hypertension.  Gallbladder:  Grossly normal.  No calcified stones.  Common bile duct:  Normal for age.  Pancreas:  Mild prominence of pancreatic duct.  No focal mass lesions.  The pancreas appears full.  Adrenal glands:  Normal.  Kidneys:  Normal enhancement and excretion.  The ureters appear within normal limits.  Sub centimeter left inferior pole renal cyst.  Stomach:  Decompressed.  Numerous upper abdominal lymph nodes are present, which may be associated with HIV, congestive or reactive. Neoplastic nodes are a consideration.  Index node along the lesser curvature of the stomach measures 14 mm x 33 mm.  Small bowel:  No small bowel obstruction.  No definite mural thickening.  Colon:   Ileocecal valve appears normal.  The appendix is not identified.  Abnormal soft tissue is present in the right pericolic gutter.  There is circumferential  mural thickening in the proximal ascending colon, with an element of obstruction likely.  This enhancing mass demonstrates several areas of low attenuation. Differential considerations include neoplasm such as primary colon cancer, lymphoma or colitis.  This is focal and restricted to the ascending colon.  There is no involvement of the transverse or descending colon.  Pelvic Genitourinary:  Grossly within normal limits.  Urinary bladder normal.  Portal venous collaterals and prominent hemorrhoidal vein.  Bones:  No AVN.  No aggressive osseous lesions.  Spondylosis.  Vasculature: Portal venous hypertension with collaterals in the upper abdomen.  Retroperitoneal collaterals are present as well. Recanalization of the periumbilical vein. Atherosclerosis of the aorta and iliac vessels.  IMPRESSION: 1.  Hepatic cirrhosis with a small amount of ascites, splenomegaly and portal venous hypertension. 2.  Mass in the ascending colon is suspicious for neoplasm.  Focal colitis considered less likely.  In this patient with HIV, colonic lymphoma is a definite consideration.  Adjacent soft tissue stranding extending into the right pericolic gutter raises the possibility of peritoneal spread of disease. 3.  Fullness of the pancreas with prominence of pancreatic duct. Findings suggestive of pancreatitis. 4.  Abdominal lymphadenopathy is nonspecific reactive, congestive, associated with HIV, or neoplastic in this patient with colonic mass.  Critical Value/emergent results were called by telephone at the time of interpretation on 09/19/2012 at 0141 hours to Dr. Manus Gunning, Jeannett Senior, who verbally acknowledged these results. We discussed the ultrasound dated been ordered but would likely add little based on the CT findings.   Original Report Authenticated By: Andreas Newport, M.D.     CBC  Recent Labs Lab 09/18/12 [phone] 09/19/12 0631 09/20/12  0355  WBC 3.6* 2.7* 3.3*  HGB 9.5* 8.1* 8.6*  HCT 30.3* 26.3* 27.7*  PLT 139* 123* 128*   MCV 79.3 79.5 79.6  MCH 24.9* 24.5* 24.7*  MCHC 31.4 30.8 31.0  RDW 15.3 15.1 15.4  LYMPHSABS 0.7 0.9  --   MONOABS 0.4 0.3  --   EOSABS 0.0 0.0  --   BASOSABS 0.0 0.0  --     Chemistries   Recent Labs Lab 09/18/12 2335 09/19/12 0631 09/20/12 0355  NA 135 134* 134*  K 4.2 3.7 3.7  CL 102 103 103  CO2 25 27 22   GLUCOSE 113* 93 81  BUN 8 6 5*  CREATININE 0.50 0.50 0.52  CALCIUM 8.5 7.8* 7.7*  AST 37 26 40*  ALT 17 13 16   ALKPHOS 106 83 74  BILITOT 0.3 0.3 0.4   ------------------------------------------------------------------------------------------------------------------ estimated creatinine clearance is 70.6 ml/min (by C-G formula based on Cr of 0.52). ------------------------------------------------------------------------------------------------------------------ No results found for this basename: HGBA1C,  in the last 72 hours ------------------------------------------------------------------------------------------------------------------  Recent Labs  09/19/12 0631  CHOL 63  HDL 14*  LDLCALC 43  TRIG 32  CHOLHDL 4.5   ------------------------------------------------------------------------------------------------------------------ No results found for this basename: TSH, T4TOTAL, FREET3, T3FREE, THYROIDAB,  in the last 72 hours ------------------------------------------------------------------------------------------------------------------  Recent Labs  09/19/12 0631  VITAMINB12 1100*  FOLATE 15.1  FERRITIN 10  TIBC 342  IRON 14*  RETICCTPCT 1.3    Coagulation profile  Recent Labs Lab 09/18/12 2335  INR 1.10    No results found for this basename: DDIMER,  in the last 72 hours  Cardiac Enzymes No results found for this basename: CK, CKMB, TROPONINI, MYOGLOBIN,  in the last 168 hours ------------------------------------------------------------------------------------------------------------------ No components found with this basename:  POCBNP,

## 2012-09-20 NOTE — Interval H&P Note (Signed)
History and Physical Interval Note:  09/20/2012 9:19 AM  Ruth Stephenson  has presented today for surgery, with the diagnosis of Ascending colon mass  The various methods of treatment have been discussed with the patient and family. After consideration of risks, benefits and other options for treatment, the patient has consented to  Procedure(s): COLONOSCOPY (N/A) as a surgical intervention .  [name] history has been reviewed, patient examined, no change in status, stable for surgery.  I have reviewed the patient's chart and labs.  Questions were answered to the patient's satisfaction.     Allesandra Huebsch D

## 2012-09-21 DIAGNOSIS — K7460 Unspecified cirrhosis of liver: Secondary | ICD-10-CM

## 2012-09-21 DIAGNOSIS — C172 Malignant neoplasm of ileum: Secondary | ICD-10-CM

## 2012-09-21 LAB — GC/CHLAMYDIA PROBE AMP
CT Probe RNA: NEGATIVE
GC Probe RNA: NEGATIVE

## 2012-09-21 MED ORDER — SODIUM CHLORIDE 0.9 % IV SOLN
1020.0000 mg | Freq: Once | INTRAVENOUS | Status: AC
  Administered 2012-09-22: 1020 mg via INTRAVENOUS
  Filled 2012-09-21: qty 34

## 2012-09-21 MED ORDER — ENSURE COMPLETE PO LIQD
237.0000 mL | Freq: Two times a day (BID) | ORAL | Status: DC
  Administered 2012-09-22 (×2): 237 mL via ORAL

## 2012-09-21 MED ORDER — ENOXAPARIN SODIUM 40 MG/0.4ML ~~LOC~~ SOLN
40.0000 mg | SUBCUTANEOUS | Status: DC
  Administered 2012-09-21 – 2012-09-22 (×2): 40 mg via SUBCUTANEOUS
  Filled 2012-09-21 (×3): qty 0.4

## 2012-09-21 NOTE — Progress Notes (Signed)
[facility]                                                                                Patient Demographics  Ruth Stephenson, is a 61 y.o. female, DOB - March 30, 1951, ZOX:[id number], WJX:[id number]  Admit date - 09/18/2012  Admitting Physician Eduard Clos, MD  Outpatient Primary MD for the patient is No primary provider on file.  LOS - 3   Chief Complaint  Patient presents with  . Abdominal Pain        Assessment & Plan     1. Abdominal pain noted in the right lower quadrant. Patient also has history of HIV, hep C with cirrhosis, CT suggestive of ascending colon mass. She has some tenderness to the right lower quadrant, mass could be malignancy versus infectious, stool cultures, GI pathogen panel PCR, ova parasites ordered pending, ID following the patient and managing antibiotics, GI following the patient to she status post colonoscopy on 09-20-12. Biopsies pending, per Dr. hung clinically highly suspicious for adenocarcinoma. Have requested general surgery to see the patient, oncology will be called.     2. HIV - check viral load and CD4 count is low, on Bactrim per ID. Continue present home HIV medications. Has been out of for HIV medications for the last 1 month since she moved from [place], ID following the patient here.      3. History of hepatitis C - probably has cirrhosis presently per the CAT scan. Consulted GI. Will placed patient on empiric antibiotics as in #1.     4. Anemia - likely anemia of chronic disease plus dilutional fall from IV fluids + Iron deficiency on An panel, stool for occult blood done by the ER physician was negative. Replace IV Venofer, monitor H&H, Type and screen. Protonix IV.     5. Tobacco abuse - advised to quit smoking.     6. Thrombocytopenia. Likely cirrhosis related, we will monitor, stable around 120s, gentle lovenox and monitor.      Code Status: full  Family Communication:  patient  Disposition Plan: home   Procedures CT abd Pelvis, Abd [place], colonoscopy   Consults  Gi, ID, CCS, Oncology   DVT Prophylaxis    Lovenox - SCDs    Lab Results  Component Value Date   PLT 128* 09/20/2012    Medications  Scheduled Meds: . clonazePAM  0.5 mg Oral BID  . elvitegravir-cobicistat-emtricitabine-tenofovir  1 tablet Oral Q breakfast  . feeding supplement  1 Container Oral BID BM  . morphine  15 mg Oral Q12H  . multivitamin with minerals  1 tablet Oral Daily  . pantoprazole (PROTONIX) IV  40 mg Intravenous Q24H  . piperacillin-tazobactam (ZOSYN)  IV  3.375 g Intravenous Q8H  . sulfamethoxazole-trimethoprim  1 tablet Oral Daily   Continuous Infusions: . sodium chloride 30 mL/hr at 09/20/12 1833   PRN Meds:.acetaminophen, HYDROmorphone (DILAUDID) injection, ondansetron (ZOFRAN) IV  Antibiotics     Anti-infectives   Start     Dose/Rate Route Frequency Ordered Stop   09/20/12 1400  piperacillin-tazobactam (ZOSYN) IVPB 3.375 g     3.375 g 12.5 mL/hr over 240 Minutes Intravenous 3 times per day  09/20/12 1331     09/19/12 1600  sulfamethoxazole-trimethoprim (BACTRIM,SEPTRA) 400-80 MG per tablet 1 tablet     1 tablet Oral Daily 09/19/12 1524     09/19/12 1200  cefoTAXime (CLAFORAN) 1 g in dextrose 5 % 50 mL IVPB  Status:  Discontinued     1 g 100 mL/hr over 30 Minutes Intravenous 4 times per day 09/19/12 0912 09/20/12 1323   09/19/12 1000  metroNIDAZOLE (FLAGYL) tablet 500 mg  Status:  Discontinued     500 mg Oral 3 times per day 09/19/12 0906 09/20/12 1323   09/19/12 1000  ciprofloxacin (CIPRO) IVPB 400 mg  Status:  Discontinued     400 mg 200 mL/hr over 60 Minutes Intravenous Every 12 hours 09/19/12 0912 09/19/12 0935   09/19/12 0800  elvitegravir-cobicistat-emtricitabine-tenofovir (STRIBILD) 150-150-200-300 MG tablet 1 tablet     1 tablet Oral Daily with breakfast 09/19/12 0512     09/19/12 0600  cefTRIAXone (ROCEPHIN) 1 g in dextrose 5 % 50 mL IVPB   Status:  Discontinued     1 g 100 mL/hr over 30 Minutes Intravenous Every 24 hours 09/19/12 0512 09/19/12 0906       Time Spent in minutes   35   SINGH,PRASHANT K M.D on 09/21/2012 at 9:24 AM  Between 7am to 7pm - Pager - [phone]  After 7pm go to [email or web address] - password [email or web address]  And look for the night coverage person covering for me after hours  [facility] Office  [phone]    Subjective:   Ruth Stephenson today has, No headache, No chest pain, No abdominal pain - No Nausea, No new weakness tingling or numbness, No Cough - SOB.   Objective:   Filed Vitals:   09/20/12 1440 09/20/12 2023 09/21/12 0604 09/21/12 0715  BP: 89/60 89/50 94/43    Pulse: 70 80 82   Temp: 97.5 F (36.4 C) 99.8 F (37.7 C) 98.5 F (36.9 C)   TempSrc: Oral Oral Oral   Resp: 16 15 16    Height:      Weight:    72.3 kg (159 lb 6.3 oz)  SpO2: 100% 97% 94%     Wt Readings from Last 3 Encounters:  09/21/12 72.3 kg (159 lb 6.3 oz)  09/21/12 72.3 kg (159 lb 6.3 oz)  09/21/12 72.3 kg (159 lb 6.3 oz)     Intake/Output Summary (Last 24 hours) at 09/21/12 0924 Last data filed at 09/21/12 0604  Gross per 24 hour  Intake   1410 ml  Output      0 ml  Net   1410 ml    Exam Awake Alert, Oriented X 3, No new F.N deficits, Normal affect [place].AT,PERRAL Supple Neck,No JVD, No cervical lymphadenopathy appriciated.  Symmetrical Chest wall movement, Good air movement bilaterally, CTAB RRR,No Gallops,Rubs or new Murmurs, No Parasternal Heave +ve B.Sounds, Abd LESS DISTENDED AND LESS TENDER RLQ, No organomegaly appriciated, No rebound - guarding or rigidity. No Cyanosis, Clubbing or edema, No new Rash or bruise     Data Review   Micro Results No results found for this or any previous visit (from the past 240 hour(s)).  Radiology Reports US Abdomen Complete  09/19/2012  *RADIOLOGY REPORT*  Clinical Data:  Right upper quadrant pain.  LIMITED ABDOMINAL ULTRASOUND - RIGHT UPPER QUADRANT   Comparison:  CT today.  Findings:  Gallbladder:  There are no gallstones.  No sonographic Murphy's sign.  Gallbladder wall is mildly thickened at 3.7 mm, with normal below the 3 mm.  This is nonspecific in a patient with HIV and could be due to either the patient's history of hepatitis or HIV.  Common bile duct:  Upper limits of normal for age, 6.5 mm.  Mild intrahepatic biliary ductal dilation is also demonstrated on CT.  Liver:  Cirrhotic.  Small amount of perihepatic ascites.  IMPRESSION: Mild gallbladder wall thickening which is nonspecific in this cirrhotic patient.  This could also be associated with HIV cholangiography.  No gallstones or common duct stone identified. Significantly, no sonographic Murphy's sign.                    Original Report Authenticated By: Andreas Newport, M.D.    Ct Abdomen Pelvis W Contrast  09/19/2012  *RADIOLOGY REPORT*  Clinical Data: Abdominal pain.  Right lower quadrant pain.  Pain radiating to the right flank.  History of HIV and hepatitis C.  CT ABDOMEN AND PELVIS WITH CONTRAST  Technique:  Multidetector CT imaging of the abdomen and pelvis was performed following the standard protocol during bolus administration of intravenous contrast.  Contrast: OMNIPAQUE IOHEXOL 300 MG/ML  SOLN  Comparison: None.  Findings: Lung Bases: Dependent atelectasis.  Linear scarring in the right middle lobe adjacent to the heart.  Liver:  Hepatic cirrhosis.  Diffuse micronodular contour.  No focal mass lesion is identified.  Mild intrahepatic biliary ductal dilation.  Spleen:  Splenomegaly compatible with portal venous hypertension.  Gallbladder:  Grossly normal.  No calcified stones.  Common bile duct:  Normal for age.  Pancreas:  Mild prominence of pancreatic duct.  No focal mass lesions.  The pancreas appears full.  Adrenal glands:  Normal.  Kidneys:  Normal enhancement and excretion.  The ureters appear within normal limits.  Sub centimeter left inferior pole renal cyst.  Stomach:   Decompressed.  Numerous upper abdominal lymph nodes are present, which may be associated with HIV, congestive or reactive. Neoplastic nodes are a consideration.  Index node along the lesser curvature of the stomach measures 14 mm x 33 mm.  Small bowel:  No small bowel obstruction.  No definite mural thickening.  Colon:   Ileocecal valve appears normal.  The appendix is not identified.  Abnormal soft tissue is present in the right pericolic gutter.  There is circumferential mural thickening in the proximal ascending colon, with an element of obstruction likely.  This enhancing mass demonstrates several areas of low attenuation. Differential considerations include neoplasm such as primary colon cancer, lymphoma or colitis.  This is focal and restricted to the ascending colon.  There is no involvement of the transverse or descending colon.  Pelvic Genitourinary:  Grossly within normal limits.  Urinary bladder normal.  Portal venous collaterals and prominent hemorrhoidal vein.  Bones:  No AVN.  No aggressive osseous lesions.  Spondylosis.  Vasculature: Portal venous hypertension with collaterals in the upper abdomen.  Retroperitoneal collaterals are present as well. Recanalization of the periumbilical vein. Atherosclerosis of the aorta and iliac vessels.  IMPRESSION: 1.  Hepatic cirrhosis with a small amount of ascites, splenomegaly and portal venous hypertension. 2.  Mass in the ascending colon is suspicious for neoplasm.  Focal colitis considered less likely.  In this patient with HIV, colonic lymphoma is a definite consideration.  Adjacent soft tissue stranding extending into the right pericolic gutter raises the possibility of peritoneal spread of disease. 3.  Fullness of the pancreas with prominence of pancreatic duct. Findings suggestive of pancreatitis. 4.  Abdominal lymphadenopathy is nonspecific reactive, congestive, associated with HIV,  or neoplastic in this patient with colonic mass.  Critical Value/emergent  results were called by telephone at the time of interpretation on 09/19/2012 at 0141 hours to Dr. Manus Gunning, Jeannett Senior, who verbally acknowledged these results. We discussed the ultrasound dated been ordered but would likely add little based on the CT findings.   Original Report Authenticated By: Andreas Newport, M.D.     CBC  Recent Labs Lab 09/18/12 2335 09/19/12 0631 09/20/12 0355  WBC 3.6* 2.7* 3.3*  HGB 9.5* 8.1* 8.6*  HCT 30.3* 26.3* 27.7*  PLT 139* 123* 128*  MCV 79.3 79.5 79.6  MCH 24.9* 24.5* 24.7*  MCHC 31.4 30.8 31.0  RDW 15.3 15.1 15.4  LYMPHSABS 0.7 0.9  --   MONOABS 0.4 0.3  --   EOSABS 0.0 0.0  --   BASOSABS 0.0 0.0  --     Chemistries   Recent Labs Lab 09/18/12 2335 09/19/12 0631 09/20/12 0355  NA 135 134* 134*  K 4.2 3.7 3.7  CL 102 103 103  CO2 25 27 22   GLUCOSE 113* 93 81  BUN 8 6 5*  CREATININE 0.50 0.50 0.52  CALCIUM 8.5 7.8* 7.7*  AST 37 26 40*  ALT 17 13 16   ALKPHOS 106 83 74  BILITOT 0.3 0.3 0.4   ------------------------------------------------------------------------------------------------------------------ estimated creatinine clearance is 72.1 ml/min (by C-G formula based on Cr of 0.52). ------------------------------------------------------------------------------------------------------------------ No results found for this basename: HGBA1C,  in the last 72 hours ------------------------------------------------------------------------------------------------------------------  Recent Labs  09/19/12 0631  CHOL 63  HDL 14*  LDLCALC 43  TRIG 32  CHOLHDL 4.5   ------------------------------------------------------------------------------------------------------------------ No results found for this basename: TSH, T4TOTAL, FREET3, T3FREE, THYROIDAB,  in the last 72 hours ------------------------------------------------------------------------------------------------------------------  Recent Labs  09/19/12 0631  VITAMINB12 1100*   FOLATE 15.1  FERRITIN 10  TIBC 342  IRON 14*  RETICCTPCT 1.3    Coagulation profile  Recent Labs Lab 09/18/12 2335  INR 1.10    No results found for this basename: DDIMER,  in the last 72 hours  Cardiac Enzymes No results found for this basename: CK, CKMB, TROPONINI, MYOGLOBIN,  in the last 168 hours ------------------------------------------------------------------------------------------------------------------ No components found with this basename: POCBNP,

## 2012-09-21 NOTE — Consult Note (Signed)
#   [id number] is consult note.  Ruth Stephenson  Psalm 34:4

## 2012-09-21 NOTE — Progress Notes (Signed)
1 Day Post-Op  Subjective: Feels better this AM.  Refused blood draw so CEA not done.  Objective: Vital signs in last 24 hours: Temp:  [97.5 F (36.4 C)-99.8 F (37.7 C)] 98.5 F (36.9 C) (03/29 0604) Pulse Rate:  [68-82] 82 (03/29 0604) Resp:  [10-31] 16 (03/29 0604) BP: (89-145)/(43-112) 94/43 mmHg (03/29 0604) SpO2:  [94 %-100 %] 94 % (03/29 0604) Weight:  [159 lb 6.3 oz (72.3 kg)] 159 lb 6.3 oz (72.3 kg) (03/29 0715) Last BM Date: 09/20/12  Intake/Output from previous day: 03/28 0701 - 03/29 0700 In: 1410 [P.O.:1360; IV Piggyback:50] Out: -  Intake/Output this shift:    PE: General- In NAD Abdomen-slight firm and distended, not as tender  Lab Results:   Recent Labs  09/19/12 0631 09/20/12 0355  WBC 2.7* 3.3*  HGB 8.1* 8.6*  HCT 26.3* 27.7*  PLT 123* 128*   BMET  Recent Labs  09/19/12 0631 09/20/12 0355  NA 134* 134*  K 3.7 3.7  CL 103 103  CO2 27 22  GLUCOSE 93 81  BUN 6 5*  CREATININE 0.50 0.52  CALCIUM 7.8* 7.7*   PT/INR  Recent Labs  09/18/12 2335  LABPROT 14.1  INR 1.10   Comprehensive Metabolic Panel:    Component Value Date/Time   NA 134* 09/20/2012 0355   K 3.7 09/20/2012 0355   CL 103 09/20/2012 0355   CO2 22 09/20/2012 0355   BUN 5* 09/20/2012 0355   CREATININE 0.52 09/20/2012 0355   GLUCOSE 81 09/20/2012 0355   CALCIUM 7.7* 09/20/2012 0355   AST 40* 09/20/2012 0355   ALT 16 09/20/2012 0355   ALKPHOS 74 09/20/2012 0355   BILITOT 0.4 09/20/2012 0355   PROT 6.6 09/20/2012 0355   ALBUMIN 2.1* 09/20/2012 0355     Studies/Results: No results found.  Anti-infectives: Anti-infectives   Start     Dose/Rate Route Frequency Ordered Stop   09/20/12 1400  piperacillin-tazobactam (ZOSYN) IVPB 3.375 g     3.375 g 12.5 mL/hr over 240 Minutes Intravenous 3 times per day 09/20/12 1331     09/19/12 1600  sulfamethoxazole-trimethoprim (BACTRIM,SEPTRA) 400-80 MG per tablet 1 tablet     1 tablet Oral Daily 09/19/12 1524     09/19/12 1200   cefoTAXime (CLAFORAN) 1 g in dextrose 5 % 50 mL IVPB  Status:  Discontinued     1 g 100 mL/hr over 30 Minutes Intravenous 4 times per day 09/19/12 0912 09/20/12 1323   09/19/12 1000  metroNIDAZOLE (FLAGYL) tablet 500 mg  Status:  Discontinued     500 mg Oral 3 times per day 09/19/12 0906 09/20/12 1323   09/19/12 1000  ciprofloxacin (CIPRO) IVPB 400 mg  Status:  Discontinued     400 mg 200 mL/hr over 60 Minutes Intravenous Every 12 hours 09/19/12 0912 09/19/12 0935   09/19/12 0800  elvitegravir-cobicistat-emtricitabine-tenofovir (STRIBILD) 150-150-200-300 MG tablet 1 tablet     1 tablet Oral Daily with breakfast 09/19/12 0512     09/19/12 0600  cefTRIAXone (ROCEPHIN) 1 g in dextrose 5 % 50 mL IVPB  Status:  Discontinued     1 g 100 mL/hr over 30 Minutes Intravenous Every 24 hours 09/19/12 0512 09/19/12 1610      Assessment   Pancreatitis   Colonic mass - right colon; biopsy results pending   HIV (human immunodeficiency virus infection)   Hepatitis C   Tobacco abuse   Anemia   Cirrhosis-Childs-Pugh Class B    LOS: 3 days  Plan: Full liquids and supplements.  Ambulate.  Surgery next week.   Ruth Stephenson J 09/21/2012

## 2012-09-22 LAB — HIV-1 RNA QUANT-NO REFLEX-BLD: HIV-1 RNA Quant, Log: 4.62 {Log} — ABNORMAL HIGH (ref <1.30)

## 2012-09-22 LAB — GC/CHLAMYDIA PROBE AMP: GC Probe RNA: NEGATIVE

## 2012-09-22 MED ORDER — POTASSIUM CHLORIDE CRYS ER 20 MEQ PO TBCR
40.0000 meq | EXTENDED_RELEASE_TABLET | Freq: Once | ORAL | Status: AC
  Administered 2012-09-22: 40 meq via ORAL
  Filled 2012-09-22: qty 2

## 2012-09-22 MED ORDER — SODIUM CHLORIDE 0.9 % IJ SOLN
10.0000 mL | Freq: Two times a day (BID) | INTRAMUSCULAR | Status: DC
  Administered 2012-09-22 – 2012-09-23 (×3): 10 mL
  Administered 2012-09-24: 20 mL
  Administered 2012-09-25 – 2012-10-09 (×7): 10 mL

## 2012-09-22 MED ORDER — MAGNESIUM CITRATE PO SOLN
1.0000 | Freq: Once | ORAL | Status: AC
  Administered 2012-09-22: 1 via ORAL

## 2012-09-22 MED ORDER — FUROSEMIDE 40 MG PO TABS
40.0000 mg | ORAL_TABLET | Freq: Once | ORAL | Status: AC
  Administered 2012-09-22: 40 mg via ORAL
  Filled 2012-09-22: qty 1

## 2012-09-22 MED ORDER — SODIUM CHLORIDE 0.9 % IJ SOLN
10.0000 mL | INTRAMUSCULAR | Status: DC | PRN
  Administered 2012-09-26 – 2012-09-27 (×3): 10 mL

## 2012-09-22 NOTE — Consult Note (Signed)
Ruth Stephenson, Ruth Stephenson NO.:  [id number]  MEDICAL RECORD NO.:  [id number]  LOCATION:  1311                         FACILITY:  [facility]  PHYSICIAN:  Josph Macho, M.D.  DATE OF BIRTH:  [birth year]  DATE OF CONSULTATION:  09/21/2012 DATE OF DISCHARGE:                                CONSULTATION   REASON FOR CONSULTATION: 1. 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 2. Hepatitis C with cirrhosis. 3. HIV positive.  HISTORY OF PRESENT ILLNESS:  Ruth Stephenson is incredibly charming 6 year old African female.  She recently moved down here from [place].  She has a history of HIV dating back to 13.  She also has hepatitis C. These appeared to have been very fairly well controlled.  She basically came in with abdominal pain.  She had not noted any change in bowel or bladder habits.  There is no melena or bright red blood per rectum.  She did note some abdominal bloating mostly around the right side.  She had a CT scan done.  This was done on the September 19, 2012.  CT scan showed there is some splenomegaly, compatible portal hypertension.  She has some hepatic cirrhosis.  There was thickening in the ascending colon.  There was likely some partial obstruction.  No other abnormalities were noted within the small or large bowel.  The pancreas appeared to be okay.  There is some nonspecific abdominal lymphadenopathy.  She had lab work done on admission, which showed a white cell count 3.6, hemoglobin 9.5, hematocrit 30.3, platelet count 139.  I suspect that her leukopenia and thrombocytopenia are reflective of her cirrhosis and splenomegaly.  She had iron studies done which showed a ferritin at 10 and iron saturation of 4%.  Her albumin was 2.1. Bilirubin was 0.3. CA has not been done.  She was seen by Dr. Audley Hose.  He did do a colonoscopy on her.  This did show an ascending colonic mass.  Biopsies were taken and pending.   Dr. Abbey Chatters of surgery has seen her.  He  is awaiting the biopsies.  Again, she has not had any obvious weight loss.  She has had no nausea or vomiting.  PAST MEDICAL HISTORY: 1. Remarkable for the a HIV. 2. Hepatitis C.  ALLERGIES:  None.  MEDICATIONS:  As stated in the record.  SOCIAL HISTORY:  Remarkable for tobacco use.  She smokes about half pack per day.  There is no obvious alcohol or social drug use.  FAMILY HISTORY:  Remarkable for mother, but she thinks she had colon cancer.  REVIEW OF SYSTEMS:  As stated in history of present illness.  PHYSICAL EXAMINATION:  GENERAL:  This is a fairly well-developed, well- nourished African female, in no obvious distress. VITAL SIGNS:  Temperature 98.8, pulse 77, respiratory 16, blood pressure 100/54. HEENT:  No scleral icterus.  There is no oral lesions. NECK:  There is no adenopathy in the neck. LUNGS:  Clear bilaterally. CARDIAC:  Regular rate and rhythm with normal S1 and S2.  There are no murmurs, rubs, or bruits. ABDOMEN:  Soft with good bowel sounds.  There is some slight distention. She has some tenderness over on  the right side of the abdomen.  Her bowel sounds actually might be a little bit decreased.  I cannot palpate any obvious abdominal mass.  There is no hepatomegaly.  Spleen tip maybe palpable at the left costal margin. EXTREMITIES:  Shows some 1+ edema in her legs.  She has good strength in her arms and legs. NEUROLOGICAL:  Shows no focal neurological deficits.  IMPRESSION:  Ruth Stephenson is a very nice 62 year old African American female with human immunodeficiency virus and hepatitis C.  The human immunodeficiency virus appears to be fairly well controlled.  She had a CD4 count and this was 150.  Infectious disease is following her along.  Again, this is going to be well down till the biopsy results.  I would suspect that this is going to be adenocarcinoma.  However, the human immunodeficiency virus one could think of Kaposi, although I  cannot remember the last time when I saw Kaposi in a woman.  Typically Kaposi hits HIV female who are homosexual.  Lymphoma certainly could be a possibility as HIV such as lymphoma can often be extranodal.  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  If this is colon cancer, I would not treat her any differently despite her having HIV.  I would still pursue aggressive intervention as her HIV appears to be very well controlled.  Her cirrhosis maybe little bit of an issue, but the platelet and white cells appeared to be okay.  I will go and give her a dose of IV iron.  She is iron deficient.  I suspect that she started bleeding from this mass.  I had a long talk with her.  Her sister was with her.  We had excellent prayer session.  We started scripture and we definitely felt better after our visit.  I will follow up once we have some biopsy results back and see how she does pending further surgery.  It would be nice to get a CEA level on her preop.  This is not all that big of a deal right now.     Josph Macho, M.D.     PRE/MEDQ  D:  09/21/2012  T:  09/22/2012  Job:  [id number]

## 2012-09-22 NOTE — Progress Notes (Signed)
[facility]                                                                                Patient Demographics  Ruth Stephenson, is a 61 y.o. female, DOB - 18-Feb-1951, AVW:[id number], WGN:[id number]  Admit date - 09/18/2012  Admitting Physician Eduard Clos, MD  Outpatient Primary MD for the patient is No primary provider on file.  LOS - 4   Chief Complaint  Patient presents with  . Abdominal Pain        Assessment & Plan     1. Abdominal pain noted in the right lower quadrant. Patient also has history of HIV, hep C with cirrhosis, CT suggestive of ascending colon mass. She has some tenderness to the right lower quadrant, mass could be malignancy versus infectious, stool cultures, GI pathogen panel PCR, ova parasites ordered pending, ID following the patient and managing antibiotics, GI following the patient to she status post colonoscopy on 09-20-12. Biopsies pending, per Dr. hung clinically highly suspicious for adenocarcinoma. Surgery and oncology following the patient, tentatively scheduled for partial colectomy either on Monday or Tuesday by general surgery     2. HIV - pending viral load and CD4 count is low, on Bactrim per ID. Continue present home HIV medications. Has been out of for HIV medications for the last 1 month since she moved from [place], ID following the patient here. GC and Chlamydia along with RPR -ve.     3. History of hepatitis C - probably has cirrhosis presently per the CAT scan. Consulted GI. Will placed patient on empiric antibiotics as in #1.     4. Anemia - likely anemia of chronic disease plus dilutional fall from IV fluids + Iron deficiency on An panel, stool for occult blood done by the ER physician was negative. Replace IV Venofer, monitor H&H, Type and screen. Protonix IV.     5. Tobacco abuse - advised to quit smoking.     6. Thrombocytopenia. Likely cirrhosis related, we will monitor, stable around 120s, gentle  lovenox and monitor.   Lab Results  Component Value Date   PLT 128* 09/20/2012     7.Mild fluid OD, stop IVF, gentle Lasix x 1.    Code Status: full  Family Communication: patient  Disposition Plan: home   Procedures CT abd Pelvis, Abd [place], colonoscopy, partial colectomy planned for Monday/tuesday   Consults  Gi, ID, CCS, Oncology   DVT Prophylaxis    Lovenox - SCDs    Lab Results  Component Value Date   PLT 128* 09/20/2012    Medications  Scheduled Meds: . clonazePAM  0.5 mg Oral BID  . elvitegravir-cobicistat-emtricitabine-tenofovir  1 tablet Oral Q breakfast  . enoxaparin (LOVENOX) injection  40 mg Subcutaneous Q24H  . feeding supplement  237 mL Oral BID BM  . magnesium citrate  1 Bottle Oral Once  . morphine  15 mg Oral Q12H  . multivitamin with minerals  1 tablet Oral Daily  . pantoprazole (PROTONIX) IV  40 mg Intravenous Q24H  . piperacillin-tazobactam (ZOSYN)  IV  3.375 g Intravenous Q8H  . sulfamethoxazole-trimethoprim  1 tablet Oral Daily   Continuous Infusions:  PRN Meds:.acetaminophen, HYDROmorphone (DILAUDID) injection, ondansetron (ZOFRAN) IV  Antibiotics     Anti-infectives   Start     Dose/Rate Route Frequency Ordered Stop   09/20/12 1400  piperacillin-tazobactam (ZOSYN) IVPB 3.375 g     3.375 g 12.5 mL/hr over 240 Minutes Intravenous 3 times per day 09/20/12 1331     09/19/12 1600  sulfamethoxazole-trimethoprim (BACTRIM,SEPTRA) 400-80 MG per tablet 1 tablet     1 tablet Oral Daily 09/19/12 1524     09/19/12 1200  cefoTAXime (CLAFORAN) 1 g in dextrose 5 % 50 mL IVPB  Status:  Discontinued     1 g 100 mL/hr over 30 Minutes Intravenous 4 times per day 09/19/12 0912 09/20/12 1323   09/19/12 1000  metroNIDAZOLE (FLAGYL) tablet 500 mg  Status:  Discontinued     500 mg Oral 3 times per day 09/19/12 0906 09/20/12 1323   09/19/12 1000  ciprofloxacin (CIPRO) IVPB 400 mg  Status:  Discontinued     400 mg 200 mL/hr over 60 Minutes Intravenous Every  12 hours 09/19/12 0912 09/19/12 0935   09/19/12 0800  elvitegravir-cobicistat-emtricitabine-tenofovir (STRIBILD) 150-150-200-300 MG tablet 1 tablet     1 tablet Oral Daily with breakfast 09/19/12 0512     09/19/12 0600  cefTRIAXone (ROCEPHIN) 1 g in dextrose 5 % 50 mL IVPB  Status:  Discontinued     1 g 100 mL/hr over 30 Minutes Intravenous Every 24 hours 09/19/12 0512 09/19/12 0906       Time Spent in minutes   35   Ruth Stephenson K M.D on 09/22/2012 at 8:54 AM  Between 7am to 7pm - Pager - [phone]  After 7pm go to [email or web address] - password [email or web address]  And look for the night coverage person covering for me after hours  [facility] Office  [phone]    Subjective:   Ruth Stephenson today has, No headache, No chest pain, No abdominal pain - No Nausea, No new weakness tingling or numbness, No Cough - SOB.   Objective:   Filed Vitals:   09/21/12 0715 09/21/12 1443 09/21/12 2115 09/22/12 0521  BP:  100/54 102/53 102/69  Pulse:  77 79 70  Temp:  98.8 F (37.1 C) 99.4 F (37.4 C) 98.4 F (36.9 C)  TempSrc:  Oral Oral Oral  Resp:  16 18 17   Height:      Weight: 72.3 kg (159 lb 6.3 oz)     SpO2:  100% 99% 100%    Wt Readings from Last 3 Encounters:  09/21/12 72.3 kg (159 lb 6.3 oz)  09/21/12 72.3 kg (159 lb 6.3 oz)  09/21/12 72.3 kg (159 lb 6.3 oz)     Intake/Output Summary (Last 24 hours) at 09/22/12 0854 Last data filed at 09/22/12 0522  Gross per 24 hour  Intake   1070 ml  Output    500 ml  Net    570 ml    Exam Awake Alert, Oriented X 3, No new F.N deficits, Normal affect [place].AT,PERRAL Supple Neck,No JVD, No cervical lymphadenopathy appriciated.  Symmetrical Chest wall movement, Good air movement bilaterally, CTAB RRR,No Gallops,Rubs or new Murmurs, No Parasternal Heave +ve B.Sounds, Abd LESS DISTENDED AND LESS TENDER RLQ, No organomegaly appriciated, No rebound - guarding or rigidity. No Cyanosis, Clubbing, 1+ edema, No new Rash or bruise      Data Review   Micro Results Recent Results (from the past 240 hour(s))  GC/CHLAMYDIA PROBE AMP     Status: None   Collection Time  09/19/12  6:39 PM      Result Value Range Status   CT Probe RNA NEGATIVE  NEGATIVE Final   GC Probe RNA NEGATIVE  NEGATIVE Final   Comment: (NOTE)                                                                                          Normal Reference Range: Negative           Assay performed using the Gen-Probe APTIMA COMBO2 (R) Assay.     Acceptable specimen types for this assay include APTIMA Swabs (Unisex,     endocervical, urethral, or vaginal), first void urine, and ThinPrep     liquid based cytology samples.  STOOL CULTURE     Status: None   Collection Time    09/20/12  3:31 AM      Result Value Range Status   Specimen Description STOOL   Final   Special Requests Immunocompromised   Final   Culture Culture reincubated for better growth   Final   Report Status PENDING   Incomplete  GC/CHLAMYDIA PROBE AMP     Status: None   Collection Time    09/21/12  5:11 PM      Result Value Range Status   CT Probe RNA NEGATIVE  NEGATIVE Final   GC Probe RNA NEGATIVE  NEGATIVE Final   Comment: (NOTE)                                                                                            Normal Reference Range: Negative           Assay performed using the Gen-Probe APTIMA COMBO2 (R) Assay.     Acceptable specimen types for this assay include APTIMA Swabs (Unisex,     endocervical, urethral, or vaginal), first void urine, and ThinPrep     liquid based cytology samples.    Radiology Reports US Abdomen Complete  09/19/2012  *RADIOLOGY REPORT*  Clinical Data:  Right upper quadrant pain.  LIMITED ABDOMINAL ULTRASOUND - RIGHT UPPER QUADRANT  Comparison:  CT today.  Findings:  Gallbladder:  There are no gallstones.  No sonographic Murphy's sign.  Gallbladder wall is mildly thickened at 3.7 mm, with normal below the 3 mm.  This is nonspecific in a  patient with HIV and could be due to either the patient's history of hepatitis or HIV.  Common bile duct:  Upper limits of normal for age, 6.5 mm.  Mild intrahepatic biliary ductal dilation is also demonstrated on CT.  Liver:  Cirrhotic.  Small amount of perihepatic ascites.  IMPRESSION: Mild gallbladder wall thickening which is nonspecific in this cirrhotic patient.  This could also be associated with HIV cholangiography.  No gallstones or common duct stone identified. Significantly, no sonographic  Murphy's sign.                    Original Report Authenticated By: Andreas Newport, M.D.    Ct Abdomen Pelvis W Contrast  09/19/2012  *RADIOLOGY REPORT*  Clinical Data: Abdominal pain.  Right lower quadrant pain.  Pain radiating to the right flank.  History of HIV and hepatitis C.  CT ABDOMEN AND PELVIS WITH CONTRAST  Technique:  Multidetector CT imaging of the abdomen and pelvis was performed following the standard protocol during bolus administration of intravenous contrast.  Contrast: OMNIPAQUE IOHEXOL 300 MG/ML  SOLN  Comparison: None.  Findings: Lung Bases: Dependent atelectasis.  Linear scarring in the right middle lobe adjacent to the heart.  Liver:  Hepatic cirrhosis.  Diffuse micronodular contour.  No focal mass lesion is identified.  Mild intrahepatic biliary ductal dilation.  Spleen:  Splenomegaly compatible with portal venous hypertension.  Gallbladder:  Grossly normal.  No calcified stones.  Common bile duct:  Normal for age.  Pancreas:  Mild prominence of pancreatic duct.  No focal mass lesions.  The pancreas appears full.  Adrenal glands:  Normal.  Kidneys:  Normal enhancement and excretion.  The ureters appear within normal limits.  Sub centimeter left inferior pole renal cyst.  Stomach:  Decompressed.  Numerous upper abdominal lymph nodes are present, which may be associated with HIV, congestive or reactive. Neoplastic nodes are a consideration.  Index node along the lesser curvature of the  stomach measures 14 mm x 33 mm.  Small bowel:  No small bowel obstruction.  No definite mural thickening.  Colon:   Ileocecal valve appears normal.  The appendix is not identified.  Abnormal soft tissue is present in the right pericolic gutter.  There is circumferential mural thickening in the proximal ascending colon, with an element of obstruction likely.  This enhancing mass demonstrates several areas of low attenuation. Differential considerations include neoplasm such as primary colon cancer, lymphoma or colitis.  This is focal and restricted to the ascending colon.  There is no involvement of the transverse or descending colon.  Pelvic Genitourinary:  Grossly within normal limits.  Urinary bladder normal.  Portal venous collaterals and prominent hemorrhoidal vein.  Bones:  No AVN.  No aggressive osseous lesions.  Spondylosis.  Vasculature: Portal venous hypertension with collaterals in the upper abdomen.  Retroperitoneal collaterals are present as well. Recanalization of the periumbilical vein. Atherosclerosis of the aorta and iliac vessels.  IMPRESSION: 1.  Hepatic cirrhosis with a small amount of ascites, splenomegaly and portal venous hypertension. 2.  Mass in the ascending colon is suspicious for neoplasm.  Focal colitis considered less likely.  In this patient with HIV, colonic lymphoma is a definite consideration.  Adjacent soft tissue stranding extending into the right pericolic gutter raises the possibility of peritoneal spread of disease. 3.  Fullness of the pancreas with prominence of pancreatic duct. Findings suggestive of pancreatitis. 4.  Abdominal lymphadenopathy is nonspecific reactive, congestive, associated with HIV, or neoplastic in this patient with colonic mass.  Critical Value/emergent results were called by telephone at the time of interpretation on 09/19/2012 at 0141 hours to Dr. Manus Gunning, Jeannett Senior, who verbally acknowledged these results. We discussed the ultrasound dated been ordered but  would likely add little based on the CT findings.   Original Report Authenticated By: Andreas Newport, M.D.     CBC  Recent Labs Lab 09/18/12 2335 09/19/12 0631 09/20/12 0355  WBC 3.6* 2.7* 3.3*  HGB 9.5* 8.1* 8.6*  HCT 30.3* 26.3* 27.7*  PLT 139* 123* 128*  MCV 79.3 79.5 79.6  MCH 24.9* 24.5* 24.7*  MCHC 31.4 30.8 31.0  RDW 15.3 15.1 15.4  LYMPHSABS 0.7 0.9  --   MONOABS 0.4 0.3  --   EOSABS 0.0 0.0  --   BASOSABS 0.0 0.0  --     Chemistries   Recent Labs Lab 09/18/12 2335 09/19/12 0631 09/20/12 0355  NA 135 134* 134*  K 4.2 3.7 3.7  CL 102 103 103  CO2 25 27 22   GLUCOSE 113* 93 81  BUN 8 6 5*  CREATININE 0.50 0.50 0.52  CALCIUM 8.5 7.8* 7.7*  AST 37 26 40*  ALT 17 13 16   ALKPHOS 106 83 74  BILITOT 0.3 0.3 0.4   ------------------------------------------------------------------------------------------------------------------ estimated creatinine clearance is 72.1 ml/min (by C-G formula based on Cr of 0.52). ------------------------------------------------------------------------------------------------------------------ No results found for this basename: HGBA1C,  in the last 72 hours ------------------------------------------------------------------------------------------------------------------ No results found for this basename: CHOL, HDL, LDLCALC, TRIG, CHOLHDL, LDLDIRECT,  in the last 72 hours ------------------------------------------------------------------------------------------------------------------ No results found for this basename: TSH, T4TOTAL, FREET3, T3FREE, THYROIDAB,  in the last 72 hours ------------------------------------------------------------------------------------------------------------------ No results found for this basename: VITAMINB12, FOLATE, FERRITIN, TIBC, IRON, RETICCTPCT,  in the last 72 hours  Coagulation profile  Recent Labs Lab 09/18/12 2335  INR 1.10    No results found for this basename: DDIMER,  in the last 72  hours  Cardiac Enzymes No results found for this basename: CK, CKMB, TROPONINI, MYOGLOBIN,  in the last 168 hours ------------------------------------------------------------------------------------------------------------------ No components found with this basename: POCBNP,

## 2012-09-22 NOTE — Progress Notes (Signed)
09/22/12 1054 Patient refuses to leave hat in toilet even after repeat reminders that we need a stool sample and to count urine output.

## 2012-09-22 NOTE — Progress Notes (Signed)
Peripherally Inserted Central Catheter/Midline Placement  The IV Nurse has discussed with the patient and/or persons authorized to consent for the patient, the purpose of this procedure and the potential benefits and risks involved with this procedure.  The benefits include less needle sticks, lab draws from the catheter and patient may be discharged home with the catheter.  Risks include, but not limited to, infection, bleeding, blood clot (thrombus formation), and puncture of an artery; nerve damage and irregular heat beat.  Alternatives to this procedure were also discussed.  PICC/Midline Placement Documentation  PICC / Midline Double Lumen 09/22/12 PICC Left Basilic (Active)       Ruth Stephenson 09/22/2012, 12:31 PM

## 2012-09-22 NOTE — Progress Notes (Signed)
2 Days Post-Op  Subjective: Still with some right sided abdominal pain.  Having multiple liquid BMs.  Objective: Vital signs in last 24 hours: Temp:  [98.4 F (36.9 C)-99.4 F (37.4 C)] 98.4 F (36.9 C) (03/30 0521) Pulse Rate:  [70-79] 70 (03/30 0521) Resp:  [16-18] 17 (03/30 0521) BP: (100-102)/(53-69) 102/69 mmHg (03/30 0521) SpO2:  [99 %-100 %] 100 % (03/30 0521) Last BM Date: 09/21/12  Intake/Output from previous day: 03/29 0701 - 03/30 0700 In: 1070 [P.O.:660; I.V.:360; IV Piggyback:50] Out: 500 [Urine:500] Intake/Output this shift:    PE: General- In NAD Abdomen-slight firm and distended, tender in right lateral abdominal area  Lab Results:   Recent Labs  09/20/12 0355  WBC 3.3*  HGB 8.6*  HCT 27.7*  PLT 128*   BMET  Recent Labs  09/20/12 0355  NA 134*  K 3.7  CL 103  CO2 22  GLUCOSE 81  BUN 5*  CREATININE 0.52  CALCIUM 7.7*   PT/INR No results found for this basename: LABPROT, INR,  in the last 72 hours Comprehensive Metabolic Panel:    Component Value Date/Time   NA 134* 09/20/2012 0355   K 3.7 09/20/2012 0355   CL 103 09/20/2012 0355   CO2 22 09/20/2012 0355   BUN 5* 09/20/2012 0355   CREATININE 0.52 09/20/2012 0355   GLUCOSE 81 09/20/2012 0355   CALCIUM 7.7* 09/20/2012 0355   AST 40* 09/20/2012 0355   ALT 16 09/20/2012 0355   ALKPHOS 74 09/20/2012 0355   BILITOT 0.4 09/20/2012 0355   PROT 6.6 09/20/2012 0355   ALBUMIN 2.1* 09/20/2012 0355     Studies/Results: No results found.  Anti-infectives: Anti-infectives   Start     Dose/Rate Route Frequency Ordered Stop   09/20/12 1400  piperacillin-tazobactam (ZOSYN) IVPB 3.375 g     3.375 g 12.5 mL/hr over 240 Minutes Intravenous 3 times per day 09/20/12 1331     09/19/12 1600  sulfamethoxazole-trimethoprim (BACTRIM,SEPTRA) 400-80 MG per tablet 1 tablet     1 tablet Oral Daily 09/19/12 1524     09/19/12 1200  cefoTAXime (CLAFORAN) 1 g in dextrose 5 % 50 mL IVPB  Status:  Discontinued     1  g 100 mL/hr over 30 Minutes Intravenous 4 times per day 09/19/12 0912 09/20/12 1323   09/19/12 1000  metroNIDAZOLE (FLAGYL) tablet 500 mg  Status:  Discontinued     500 mg Oral 3 times per day 09/19/12 0906 09/20/12 1323   09/19/12 1000  ciprofloxacin (CIPRO) IVPB 400 mg  Status:  Discontinued     400 mg 200 mL/hr over 60 Minutes Intravenous Every 12 hours 09/19/12 0912 09/19/12 0935   09/19/12 0800  elvitegravir-cobicistat-emtricitabine-tenofovir (STRIBILD) 150-150-200-300 MG tablet 1 tablet     1 tablet Oral Daily with breakfast 09/19/12 0512     09/19/12 0600  cefTRIAXone (ROCEPHIN) 1 g in dextrose 5 % 50 mL IVPB  Status:  Discontinued     1 g 100 mL/hr over 30 Minutes Intravenous Every 24 hours 09/19/12 0512 09/19/12 1610      Assessment   Pancreatitis   Colonic mass - right colon; biopsy results pending; concerning for malignancy based on gross appearance   HIV (human immunodeficiency virus infection)   Hepatitis C   Tobacco abuse   Anemia   Cirrhosis-Childs-Pugh Class B    LOS: 4 days   Plan: Remain on liquids.  Gentle bowel prep beginning this evening.  Partial colectomy tomorrow or Tuesday.   Ruth Stephenson Shela Commons  09/22/2012   

## 2012-09-23 ENCOUNTER — Encounter (HOSPITAL_COMMUNITY): Payer: Self-pay | Admitting: Anesthesiology

## 2012-09-23 ENCOUNTER — Inpatient Hospital Stay (HOSPITAL_COMMUNITY): Payer: Medicaid - Out of State | Admitting: Anesthesiology

## 2012-09-23 ENCOUNTER — Encounter (HOSPITAL_COMMUNITY): Payer: Self-pay | Admitting: Gastroenterology

## 2012-09-23 ENCOUNTER — Encounter (HOSPITAL_COMMUNITY)
Admission: EM | Disposition: A | Discharge status: Home / Self Care | Payer: Self-pay | Source: Home / Self Care | Attending: Internal Medicine

## 2012-09-23 HISTORY — PX: COLON SURGERY: SHX602

## 2012-09-23 HISTORY — PX: COLON RESECTION: SHX5231

## 2012-09-23 LAB — CEA
CEA: 1.9 ng/mL (ref 0.0–5.0)
CEA: 2 ng/mL (ref 0.0–5.0)

## 2012-09-23 LAB — CBC
MCH: 26.4 pg (ref 26.0–34.0)
MCHC: 32 g/dL (ref 30.0–36.0)
MCV: 82.4 fL (ref 78.0–100.0)
Platelets: 151 10*3/uL (ref 150–400)
Platelets: 149 10*3/uL — ABNORMAL LOW (ref 150–400)
RDW: 15.8 % — ABNORMAL HIGH (ref 11.5–15.5)
RDW: 15.5 % (ref 11.5–15.5)
WBC: 3.1 10*3/uL — ABNORMAL LOW (ref 4.0–10.5)

## 2012-09-23 LAB — PREALBUMIN: Prealbumin: 3 mg/dL — ABNORMAL LOW (ref 17.0–34.0)

## 2012-09-23 LAB — SURGICAL PCR SCREEN: MRSA, PCR: NEGATIVE

## 2012-09-23 LAB — BASIC METABOLIC PANEL
Chloride: 105 mEq/L (ref 96–112)
GFR calc Af Amer: >90 mL/min (ref >90)
Potassium: 3.5 mEq/L (ref 3.5–5.1)
Sodium: 139 mEq/L (ref 135–145)

## 2012-09-23 LAB — PROTIME-INR: Prothrombin Time: 17 seconds — ABNORMAL HIGH (ref 11.6–15.2)

## 2012-09-23 SURGERY — LAPAROSCOPIC RIGHT COLON RESECTION
Anesthesia: General | Wound class: Contaminated

## 2012-09-23 MED ORDER — SUCCINYLCHOLINE CHLORIDE 20 MG/ML IJ SOLN
INTRAMUSCULAR | Status: DC | PRN
  Administered 2012-09-23: 100 mg via INTRAVENOUS

## 2012-09-23 MED ORDER — EPHEDRINE SULFATE 50 MG/ML IJ SOLN
INTRAMUSCULAR | Status: DC | PRN
  Administered 2012-09-23: 5 mg via INTRAVENOUS

## 2012-09-23 MED ORDER — FUROSEMIDE 40 MG PO TABS: 40.0000 mg | ORAL_TABLET | Freq: Once | ORAL | Status: DC

## 2012-09-23 MED ORDER — VITAMINS A & D EX OINT
TOPICAL_OINTMENT | CUTANEOUS | Status: AC
  Administered 2012-09-23: 5
  Filled 2012-09-23: qty 5

## 2012-09-23 MED ORDER — PROMETHAZINE HCL 25 MG/ML IJ SOLN: 6.2500 mg | INTRAMUSCULAR | Status: DC | PRN

## 2012-09-23 MED ORDER — FUROSEMIDE 10 MG/ML IJ SOLN
20.0000 mg | Freq: Once | INTRAMUSCULAR | Status: DC
  Filled 2012-09-23: qty 2

## 2012-09-23 MED ORDER — PROPOFOL 10 MG/ML IV EMUL
INTRAVENOUS | Status: DC | PRN
  Administered 2012-09-23: 150 mg via INTRAVENOUS

## 2012-09-23 MED ORDER — POTASSIUM CHLORIDE IN NACL 20-0.9 MEQ/L-% IV SOLN
INTRAVENOUS | Status: DC
  Administered 2012-09-23: 100 mL/h via INTRAVENOUS
  Administered 2012-09-24: 04:00:00 via INTRAVENOUS
  Filled 2012-09-23 (×3): qty 1000

## 2012-09-23 MED ORDER — LIDOCAINE HCL (CARDIAC) 20 MG/ML IV SOLN: INTRAVENOUS | Status: DC | PRN

## 2012-09-23 MED ORDER — ONDANSETRON HCL 4 MG/2ML IJ SOLN
INTRAMUSCULAR | Status: DC | PRN
  Administered 2012-09-23 (×2): 2 mg via INTRAVENOUS

## 2012-09-23 MED ORDER — FENTANYL CITRATE 0.05 MG/ML IJ SOLN
INTRAMUSCULAR | Status: DC | PRN
  Administered 2012-09-23 (×8): 50 ug via INTRAVENOUS
  Administered 2012-09-23: 100 ug via INTRAVENOUS

## 2012-09-23 MED ORDER — HYDROMORPHONE HCL PF 2 MG/ML IJ SOLN
2.0000 mg | INTRAMUSCULAR | Status: DC | PRN
  Administered 2012-09-23 – 2012-09-26 (×28): 2 mg via INTRAVENOUS
  Administered 2012-09-26: 3 mg via INTRAVENOUS
  Administered 2012-09-26 (×2): 2 mg via INTRAVENOUS
  Administered 2012-09-26: 3 mg via INTRAVENOUS
  Administered 2012-09-26 – 2012-09-27 (×8): 2 mg via INTRAVENOUS
  Filled 2012-09-23 (×3): qty 1
  Filled 2012-09-23: qty 2
  Filled 2012-09-23 (×30): qty 1
  Filled 2012-09-23: qty 2
  Filled 2012-09-23 (×5): qty 1

## 2012-09-23 MED ORDER — HYDROMORPHONE HCL PF 1 MG/ML IJ SOLN
INTRAMUSCULAR | Status: DC | PRN
  Administered 2012-09-23 (×2): 1 mg via INTRAVENOUS

## 2012-09-23 MED ORDER — FUROSEMIDE 10 MG/ML IJ SOLN
40.0000 mg | Freq: Once | INTRAMUSCULAR | Status: DC
  Filled 2012-09-23: qty 4

## 2012-09-23 MED ORDER — LACTATED RINGERS IV SOLN
INTRAVENOUS | Status: DC | PRN
  Administered 2012-09-23 (×2): via INTRAVENOUS

## 2012-09-23 MED ORDER — BUPIVACAINE HCL (PF) 0.5 % IJ SOLN
INTRAMUSCULAR | Status: DC | PRN
  Administered 2012-09-23: 10 mL

## 2012-09-23 MED ORDER — DEXAMETHASONE SODIUM PHOSPHATE 10 MG/ML IJ SOLN
INTRAMUSCULAR | Status: DC | PRN
  Administered 2012-09-23: 10 mg via INTRAVENOUS

## 2012-09-23 MED ORDER — SODIUM CHLORIDE 0.9 % IV SOLN
INTRAVENOUS | Status: DC | PRN
  Administered 2012-09-23: 11:00:00 via INTRAVENOUS

## 2012-09-23 MED ORDER — MIDAZOLAM HCL 5 MG/5ML IJ SOLN
INTRAMUSCULAR | Status: DC | PRN
  Administered 2012-09-23 (×4): 1 mg via INTRAVENOUS

## 2012-09-23 MED ORDER — 0.9 % SODIUM CHLORIDE (POUR BTL) OPTIME
TOPICAL | Status: DC | PRN
  Administered 2012-09-23: 2000 mL

## 2012-09-23 MED ORDER — LIDOCAINE HCL (CARDIAC) 20 MG/ML IV SOLN
INTRAVENOUS | Status: DC | PRN
  Administered 2012-09-23: 50 mg via INTRAVENOUS

## 2012-09-23 MED ORDER — CISATRACURIUM BESYLATE (PF) 10 MG/5ML IV SOLN
INTRAVENOUS | Status: DC | PRN
  Administered 2012-09-23: 4 mg via INTRAVENOUS
  Administered 2012-09-23: 10 mg via INTRAVENOUS
  Administered 2012-09-23 (×2): 4 mg via INTRAVENOUS

## 2012-09-23 MED ORDER — LACTATED RINGERS IV SOLN
INTRAVENOUS | Status: DC | PRN
  Administered 2012-09-23: 11:00:00 via INTRAVENOUS

## 2012-09-23 MED ORDER — HYDROMORPHONE HCL PF 1 MG/ML IJ SOLN
0.2500 mg | INTRAMUSCULAR | Status: DC | PRN
  Administered 2012-09-23: 0.5 mg via INTRAVENOUS

## 2012-09-23 MED ORDER — STERILE WATER FOR IRRIGATION IR SOLN
Status: DC | PRN
  Administered 2012-09-23: 1000 mL

## 2012-09-23 SURGICAL SUPPLY — 87 items
ADH SKN CLS APL DERMABOND .7 (GAUZE/BANDAGES/DRESSINGS)
APPLIER CLIP 5 13 M/L LIGAMAX5 (MISCELLANEOUS)
APPLIER CLIP ROT 10 11.4 M/L (STAPLE)
APR CLP MED LRG 11.4X10 (STAPLE)
APR CLP MED LRG 5 ANG JAW (MISCELLANEOUS)
BLADE HEX COATED 2.75 (ELECTRODE) ×3 IMPLANT
BLADE SURG SZ10 CARB STEEL (BLADE) ×3 IMPLANT
CELLS DAT CNTRL 66122 CELL SVR (MISCELLANEOUS) ×1 IMPLANT
CHLORAPREP W/TINT 26ML (MISCELLANEOUS) ×2 IMPLANT
CLIP APPLIE 5 13 M/L LIGAMAX5 (MISCELLANEOUS) IMPLANT
CLIP APPLIE ROT 10 11.4 M/L (STAPLE) IMPLANT
CLOTH BEACON ORANGE TIMEOUT ST (SAFETY) ×2 IMPLANT
COVER MAYO STAND STRL (DRAPES) ×2 IMPLANT
COVER SURGICAL LIGHT HANDLE (MISCELLANEOUS) ×1 IMPLANT
DERMABOND ADVANCED (GAUZE/BANDAGES/DRESSINGS)
DERMABOND ADVANCED .7 DNX12 (GAUZE/BANDAGES/DRESSINGS) IMPLANT
DRAPE CAMERA CLOSED 9X96 (DRAPES) ×2 IMPLANT
DRAPE LAPAROSCOPIC ABDOMINAL (DRAPES) ×2 IMPLANT
DRAPE LG THREE QUARTER DISP (DRAPES) IMPLANT
DRAPE UTILITY XL STRL (DRAPES) ×3 IMPLANT
DRAPE WARM FLUID 44X44 (DRAPE) ×2 IMPLANT
DRSG OPSITE POSTOP 4X8 (GAUZE/BANDAGES/DRESSINGS) ×1 IMPLANT
ELECT REM PT RETURN 9FT ADLT (ELECTROSURGICAL) ×2
ELECTRODE REM PT RTRN 9FT ADLT (ELECTROSURGICAL) ×1 IMPLANT
ENDOLOOP SUT PDS II  0 18 (SUTURE)
ENDOLOOP SUT PDS II 0 18 (SUTURE) IMPLANT
GLOVE ECLIPSE 7.0 STRL STRAW (GLOVE) ×4 IMPLANT
GLOVE SURG SS PI 7.5 STRL IVOR (GLOVE) ×2 IMPLANT
GOWN STRL NON-REIN LRG LVL3 (GOWN DISPOSABLE) ×4 IMPLANT
GOWN STRL REIN XL XLG (GOWN DISPOSABLE) ×7 IMPLANT
HEMOSTAT SURGICEL 4X8 (HEMOSTASIS) ×1 IMPLANT
KIT BASIN OR (CUSTOM PROCEDURE TRAY) ×2 IMPLANT
LEGGING LITHOTOMY PAIR STRL (DRAPES) ×1 IMPLANT
LIGASURE IMPACT 36 18CM CVD LR (INSTRUMENTS) ×1 IMPLANT
MARKER SKIN DUAL TIP RULER LAB (MISCELLANEOUS) ×2 IMPLANT
NS IRRIG 1000ML POUR BTL (IV SOLUTION) ×2 IMPLANT
PACK GENERAL/GYN (CUSTOM PROCEDURE TRAY) ×1 IMPLANT
PENCIL BUTTON HOLSTER BLD 10FT (ELECTRODE) ×2 IMPLANT
RELOAD PROXIMATE 75MM BLUE (ENDOMECHANICALS) ×4 IMPLANT
RELOAD STAPLE 75 3.8 BLU REG (ENDOMECHANICALS) IMPLANT
RETRACTOR WND ALEXIS 18 MED (MISCELLANEOUS) IMPLANT
RTRCTR WOUND ALEXIS 18CM MED (MISCELLANEOUS) ×2
SCALPEL HARMONIC ACE (MISCELLANEOUS) ×1 IMPLANT
SCISSORS LAP 5X35 DISP (ENDOMECHANICALS) ×2 IMPLANT
SET IRRIG TUBING LAPAROSCOPIC (IRRIGATION / IRRIGATOR) IMPLANT
SLEEVE ADV FIXATION 12X100MM (TROCAR) IMPLANT
SLEEVE ADV FIXATION 5X100MM (TROCAR) ×2 IMPLANT
SLEEVE SURGEON STRL (DRAPES) ×2 IMPLANT
SLEEVE Z-THREAD 12X100MM (TROCAR) IMPLANT
SLEEVE Z-THREAD 5X100MM (TROCAR) IMPLANT
SOLUTION ANTI FOG 6CC (MISCELLANEOUS) ×2 IMPLANT
SPONGE GAUZE 4X4 12PLY (GAUZE/BANDAGES/DRESSINGS) ×1 IMPLANT
SPONGE LAP 18X18 X RAY DECT (DISPOSABLE) ×3 IMPLANT
STAPLER GUN LINEAR PROX 60 (STAPLE) ×1 IMPLANT
STAPLER PROXIMATE 75MM BLUE (STAPLE) ×2 IMPLANT
STAPLER VISISTAT 35W (STAPLE) ×2 IMPLANT
SUCTION POOLE TIP (SUCTIONS) ×2 IMPLANT
SUT MNCRL AB 4-0 PS2 18 (SUTURE) IMPLANT
SUT PDS AB 0 CT1 36 (SUTURE) ×4 IMPLANT
SUT PDS AB 1 CT1 27 (SUTURE) IMPLANT
SUT PDS AB 1 CTX 36 (SUTURE) IMPLANT
SUT PDS AB 1 TP1 96 (SUTURE) IMPLANT
SUT SILK 2 0 (SUTURE) ×2
SUT SILK 2 0 SH CR/8 (SUTURE) ×3 IMPLANT
SUT SILK 2-0 18XBRD TIE 12 (SUTURE) ×1 IMPLANT
SUT SILK 3 0 (SUTURE) ×2
SUT SILK 3 0 SH CR/8 (SUTURE) ×2 IMPLANT
SUT SILK 3-0 18XBRD TIE 12 (SUTURE) ×1 IMPLANT
SYR BULB IRRIGATION 50ML (SYRINGE) ×2 IMPLANT
SYS LAPSCP GELPORT 120MM (MISCELLANEOUS)
SYSTEM LAPSCP GELPORT 120MM (MISCELLANEOUS) IMPLANT
TOWEL OR 17X26 10 PK STRL BLUE (TOWEL DISPOSABLE) ×2 IMPLANT
TRAY FOLEY CATH 14FRSI W/METER (CATHETERS) ×2 IMPLANT
TRAY LAP CHOLE (CUSTOM PROCEDURE TRAY) ×3 IMPLANT
TROCAR ADV FIXATION 11X100MM (TROCAR) IMPLANT
TROCAR ADV FIXATION 12X100MM (TROCAR) IMPLANT
TROCAR ADV FIXATION 5X100MM (TROCAR) ×1 IMPLANT
TROCAR BLADELESS OPT 5 75 (ENDOMECHANICALS) IMPLANT
TROCAR XCEL BLUNT TIP 100MML (ENDOMECHANICALS) ×1 IMPLANT
TROCAR XCEL NON-BLD 11X100MML (ENDOMECHANICALS) IMPLANT
TROCAR Z-THREAD FIOS 11X100 BL (TROCAR) IMPLANT
TROCAR Z-THREAD FIOS 12X100MM (TROCAR) IMPLANT
TROCAR Z-THREAD FIOS 5X100MM (TROCAR) IMPLANT
TROCAR Z-THREAD SLEEVE 11X100 (TROCAR) IMPLANT
TUBING INSUFFLATION 10FT LAP (TUBING) ×2 IMPLANT
YANKAUER SUCT BULB TIP 10FT TU (MISCELLANEOUS) ×3 IMPLANT
YANKAUER SUCT BULB TIP NO VENT (SUCTIONS) ×2 IMPLANT

## 2012-09-23 NOTE — Anesthesia Postprocedure Evaluation (Signed)
  Anesthesia Post-op Note  Patient: Ruth Stephenson  Procedure(s) Performed: Procedure(s) (LRB): LAPAROSCOPIC RIGHT COLON RESECTION    (N/A)  Patient Location: PACU  Anesthesia Type: General  Level of Consciousness: awake and alert   Airway and Oxygen Therapy: Patient Spontanous Breathing  Post-op Pain: mild  Post-op Assessment: Post-op Vital signs reviewed, Patient's Cardiovascular Status Stable, Respiratory Function Stable, Patent Airway and No signs of Nausea or vomiting  Last Vitals:  Filed Vitals:   09/23/12 1515  BP: 143/68  Pulse: 81  Temp:   Resp: 20    Post-op Vital Signs: stable   Complications: No apparent anesthesia complications

## 2012-09-23 NOTE — Progress Notes (Signed)
She is pretty emotional this morning. Does not like that if she cannot eat. She is frustrated not knowing if she is going to have surgery today.  She got a PICC line placed. I will order a CEA and prealbumin for her today.  From my point of view, she is ready for surgery. I don't see a reason why she cannot have surgery. I the only "caveat" would be if the biopsy comes back as a lymphoma. Anything else, I think surgery should proceed.  She got iron. Hemoglobin 8.4. The iron  will not start working for another 2-3 weeks. Is possible if she may need to be transfused in the perioperative period.  We will continue to follow a long in provide further recommendations depending on pathology and surgery results.  Pete E.  Hebrews 12:12

## 2012-09-23 NOTE — Transfer of Care (Signed)
Immediate Anesthesia Transfer of Care Note  Patient: Ruth Stephenson  Procedure(s) Performed: Procedure(s) with comments: LAPAROSCOPIC RIGHT COLON RESECTION    (N/A) - LAPOROSCOPY, RIGHT HEMICOLECTOMY  Patient Location: PACU  Anesthesia Type:General  Level of Consciousness: awake, pateint uncooperative, confused and responds to stimulation  Airway & Oxygen Therapy: Patient Spontanous Breathing and Patient connected to face mask oxygen  Post-op Assessment: Report given to PACU RN, Post -op Vital signs reviewed and stable and Patient moving all extremities  Post vital signs: Reviewed and stable  Complications: No apparent anesthesia complications

## 2012-09-23 NOTE — Progress Notes (Signed)
Patient ID: Ruth Stephenson, female   DOB: Sep 13, 1950, 62 y.o.   MRN: [id number] 3 Days Post-Op  Subjective: Some discomfort on the right side. She is hungry and anxious to go ahead with surgery.  Objective: Vital signs in last 24 hours: Temp:  [97.6 F (36.4 C)-98.8 F (37.1 C)] 97.6 F (36.4 C) (03/31 0522) Pulse Rate:  [71-80] 71 (03/31 0522) Resp:  [14-16] 15 (03/31 0522) BP: (94-111)/(53-64) 94/53 mmHg (03/31 0522) SpO2:  [92 %-95 %] 92 % (03/31 0522) Weight:  [159 lb 2.8 oz (72.2 kg)] 159 lb 2.8 oz (72.2 kg) (03/31 0522) Last BM Date: 09/21/12  Intake/Output from previous day: 03/30 0701 - 03/31 0700 In: 1240 [P.O.:1080; I.V.:10; IV Piggyback:150] Out: -  Intake/Output this shift:    General appearance: alert, cooperative and no distress GI: mildly distended with fluid wave. There is a palpable mass in the right lateral abdomen with mild tenderness. Extremities: edema 2+ in  lower extremities bilaterally  Lab Results:   Recent Labs  09/23/12 0415  WBC 3.1*  HGB 8.4*  HCT 27.6*  PLT 151   BMET  Recent Labs  09/23/12 0415  NA 139  K 3.5  CL 105  CO2 31  GLUCOSE 116*  BUN 4*  CREATININE 0.56  CALCIUM 8.2*     Studies/Results: No results found.  Anti-infectives: Anti-infectives   Start     Dose/Rate Route Frequency Ordered Stop   09/20/12 1400  piperacillin-tazobactam (ZOSYN) IVPB 3.375 g     3.375 g 12.5 mL/hr over 240 Minutes Intravenous 3 times per day 09/20/12 1331     09/19/12 1600  sulfamethoxazole-trimethoprim (BACTRIM,SEPTRA) 400-80 MG per tablet 1 tablet     1 tablet Oral Daily 09/19/12 1524     09/19/12 1200  cefoTAXime (CLAFORAN) 1 g in dextrose 5 % 50 mL IVPB  Status:  Discontinued     1 g 100 mL/hr over 30 Minutes Intravenous 4 times per day 09/19/12 0912 09/20/12 1323   09/19/12 1000  metroNIDAZOLE (FLAGYL) tablet 500 mg  Status:  Discontinued     500 mg Oral 3 times per day 09/19/12 0906 09/20/12 1323   09/19/12 1000  ciprofloxacin  (CIPRO) IVPB 400 mg  Status:  Discontinued     400 mg 200 mL/hr over 60 Minutes Intravenous Every 12 hours 09/19/12 0912 09/19/12 0935   09/19/12 0800  elvitegravir-cobicistat-emtricitabine-tenofovir (STRIBILD) 150-150-200-300 MG tablet 1 tablet     1 tablet Oral Daily with breakfast 09/19/12 0512     09/19/12 0600  cefTRIAXone (ROCEPHIN) 1 g in dextrose 5 % 50 mL IVPB  Status:  Discontinued     1 g 100 mL/hr over 30 Minutes Intravenous Every 24 hours 09/19/12 0512 09/19/12 4098      Assessment/Plan: s/p Procedure(s): COLONOSCOPY Large right colon mass with partial obstruction, clinically appears to be malignancy on colonoscopy. Final pathology report is pending. I discussed the situation in detail with the patient. She will need partial colectomy even if the biopsy were not diagnostic. We discussed the nature of her problem and I recommend proceeding with laparoscopic and possible open partial colectomy. We discussed the indications for the procedure and alternatives of waiting for the biopsy report or treating with antibiotics which I would not recommend. She agrees strongly about proceeding with surgery. We discussed the nature of the operation and risks of anesthetic complications, bleeding, infection, anastomotic leak and possible need for temporary ostomy. We discussed that she is at increased risk for surgery due to  her cirrhosis and HIV status. However there are no other reasonable alternative treatments. All her questions were answered.   LOS: 5 days    Maddix Heinz T 09/23/2012

## 2012-09-23 NOTE — Preoperative (Signed)
Beta Blockers   Reason not to administer Beta Blockers:Not Applicable 

## 2012-09-23 NOTE — Anesthesia Preprocedure Evaluation (Signed)
Anesthesia Evaluation  Patient identified by MRN, date of birth, ID band Patient awake    Reviewed: Allergy & Precautions, H&P , NPO status , Patient's Chart, lab work & pertinent test results  Airway Mallampati: II TM Distance: >3 FB Neck ROM: Full    Dental no notable dental hx.    Pulmonary Current Smoker,  breath sounds clear to auscultation  Pulmonary exam normal       Cardiovascular negative cardio ROS  Rhythm:Regular Rate:Normal     Neuro/Psych negative neurological ROS  negative psych ROS   GI/Hepatic negative GI ROS, (+) Hepatitis -, C  Endo/Other  negative endocrine ROS  Renal/GU negative Renal ROS  negative genitourinary   Musculoskeletal negative musculoskeletal ROS (+)   Abdominal   Peds negative pediatric ROS (+)  Hematology  (+) Blood dyscrasia, anemia , HIV,   Anesthesia Other Findings   Reproductive/Obstetrics negative OB ROS                           Anesthesia Physical Anesthesia Plan  ASA: III  Anesthesia Plan: General   Post-op Pain Management:    Induction: Intravenous  Airway Management Planned: Oral ETT  Additional Equipment:   Intra-op Plan:   Post-operative Plan: Extubation in OR  Informed Consent: I have reviewed the patients History and Physical, chart, labs and discussed the procedure including the risks, benefits and alternatives for the proposed anesthesia with the patient or authorized representative who has indicated his/her understanding and acceptance.   Dental advisory given  Plan Discussed with: CRNA and Surgeon  Anesthesia Plan Comments:         Anesthesia Quick Evaluation

## 2012-09-23 NOTE — Progress Notes (Signed)
[facility]                                                                                Patient Demographics  Ruth Stephenson, is a 62 y.o. female, DOB - 1951/01/25, QIO:[id number], LKG:[id number]  Admit date - 09/18/2012  Admitting Physician Eduard Clos, MD  Outpatient Primary MD for the patient is No primary provider on file.  LOS - 5   Chief Complaint  Patient presents with  . Abdominal Pain        Assessment & Plan   Summary   This is a 62 year old [place] American female with history of hep C and HIV for the last 20 years, who was recently moved from [place] about a month ago, she had been off of her HIV medications for the last 1 month due to the move. She presented here a few days ago with right-sided abdominal pain and discomfort, CT of the abdomen suggestive of large ascending colon mass suggestive of adenocarcinoma, she was seen by GI, oncology ID and general surgery. He underwent diagnostic colonoscopy by Dr. Elnoria Howard, reports are suggestive of adenocarcinoma in any case she scheduled for partial colectomy on 09/23/2012.  He has developed mild edema and accommodation of fluid overload and low albumin from cirrhosis, gentle Lasix.     1. Abdominal pain noted in the right lower quadrant. Patient also has history of HIV, hep C with cirrhosis, CT suggestive of ascending colon mass. She has some tenderness to the right lower quadrant, mass could be malignancy versus infectious, stool cultures, GI pathogen panel PCR, ova parasites ordered pending, ID following the patient and managing antibiotics, GI following the patient to she status post colonoscopy on 09-20-12. Biopsies pending, per Dr. hung clinically highly suspicious for adenocarcinoma. Surgery and oncology following the patient, tentatively scheduled for partial colectomy on 09/23/2012.     2. HIV - noted high viral load and CD4 count is low, on Bactrim per ID. Continue present home HIV  medications. Has been out of for HIV medications for the last 1 month since she moved from [place], ID following the patient here. GC and Chlamydia along with RPR -ve. HIV genotype is pending.     3. History of hepatitis C - probably has cirrhosis presently per the CAT scan. Consulted GI. Will placed patient on empiric antibiotics as in #1.     4. Anemia - likely anemia of chronic disease plus dilutional fall from IV fluids + Iron deficiency on An panel, stool for occult blood done by the ER physician was negative. Replace IV Venofer, monitor H&H, Type and screen. Protonix IV.     5. Tobacco abuse - advised to quit smoking.     6. Thrombocytopenia. Likely cirrhosis related, we will monitor, stable around 120s, gentle lovenox and monitor.   Lab Results  Component Value Date   PLT 151 09/23/2012     7.Moderate fluid OD, stopped IVF, cirrhosis worsens her edema, gentle Lasix repeat another dose on 09/23/2012    Code Status: full  Family Communication: patient  Disposition Plan: home   Procedures CT abd Pelvis, Abd [place], colonoscopy, partial colectomy planned for 09/23/2012, left arm PICC  line placed 09/22/2012.   Consults  Gi, ID, CCS, Oncology   DVT Prophylaxis    Lovenox - SCDs    Lab Results  Component Value Date   PLT 151 09/23/2012    Medications  Scheduled Meds: . clonazePAM  0.5 mg Oral BID  . elvitegravir-cobicistat-emtricitabine-tenofovir  1 tablet Oral Q breakfast  . feeding supplement  237 mL Oral BID BM  . furosemide  20 mg Intravenous Once  . morphine  15 mg Oral Q12H  . multivitamin with minerals  1 tablet Oral Daily  . pantoprazole (PROTONIX) IV  40 mg Intravenous Q24H  . piperacillin-tazobactam (ZOSYN)  IV  3.375 g Intravenous Q8H  . sodium chloride  10-40 mL Intracatheter Q12H  . sulfamethoxazole-trimethoprim  1 tablet Oral Daily   Continuous Infusions:   PRN Meds:.acetaminophen, HYDROmorphone (DILAUDID) injection, ondansetron (ZOFRAN)  IV, sodium chloride  Antibiotics     Anti-infectives   Start     Dose/Rate Route Frequency Ordered Stop   09/20/12 1400  piperacillin-tazobactam (ZOSYN) IVPB 3.375 g     3.375 g 12.5 mL/hr over 240 Minutes Intravenous 3 times per day 09/20/12 1331     09/19/12 1600  sulfamethoxazole-trimethoprim (BACTRIM,SEPTRA) 400-80 MG per tablet 1 tablet     1 tablet Oral Daily 09/19/12 1524     09/19/12 1200  cefoTAXime (CLAFORAN) 1 g in dextrose 5 % 50 mL IVPB  Status:  Discontinued     1 g 100 mL/hr over 30 Minutes Intravenous 4 times per day 09/19/12 0912 09/20/12 1323   09/19/12 1000  metroNIDAZOLE (FLAGYL) tablet 500 mg  Status:  Discontinued     500 mg Oral 3 times per day 09/19/12 0906 09/20/12 1323   09/19/12 1000  ciprofloxacin (CIPRO) IVPB 400 mg  Status:  Discontinued     400 mg 200 mL/hr over 60 Minutes Intravenous Every 12 hours 09/19/12 0912 09/19/12 0935   09/19/12 0800  elvitegravir-cobicistat-emtricitabine-tenofovir (STRIBILD) 150-150-200-300 MG tablet 1 tablet     1 tablet Oral Daily with breakfast 09/19/12 0512     09/19/12 0600  cefTRIAXone (ROCEPHIN) 1 g in dextrose 5 % 50 mL IVPB  Status:  Discontinued     1 g 100 mL/hr over 30 Minutes Intravenous Every 24 hours 09/19/12 0512 09/19/12 0906       Time Spent in minutes   35   Ruth Stephenson K M.D on 09/23/2012 at 9:22 AM  Between 7am to 7pm - Pager - [phone]  After 7pm go to [email or web address] - password [email or web address]  And look for the night coverage person covering for me after hours  [facility] Office  [phone]    Subjective:   [name] today has, No headache, No chest pain, No abdominal pain - No Nausea, No new weakness tingling or numbness, No Cough - SOB.   Objective:   Filed Vitals:   09/22/12 0521 09/22/12 1444 09/22/12 2012 09/23/12 0522  BP: 102/69 111/59 104/64 94/53  Pulse: 70 76 80 71  Temp: 98.4 F (36.9 C) 98.6 F (37 C) 98.8 F (37.1 C) 97.6 F (36.4 C)  TempSrc: Oral Oral  Oral Oral  Resp: 17 16 14 15   Height:      Weight:    72.2 kg (159 lb 2.8 oz)  SpO2: 100% 95% 95% 92%    Wt Readings from Last 3 Encounters:  09/23/12 72.2 kg (159 lb 2.8 oz)  09/23/12 72.2 kg (159 lb 2.8 oz)  09/23/12 72.2 kg (159 lb 2.8 oz)  Intake/Output Summary (Last 24 hours) at 09/23/12 1308 Last data filed at 09/23/12 0700  Gross per 24 hour  Intake   1140 ml  Output      0 ml  Net   1140 ml    Exam Awake Alert, Oriented X 3, No new F.N deficits, Normal affect .AT,PERRAL Supple Neck,No JVD, No cervical lymphadenopathy appriciated.  Symmetrical Chest wall movement, Good air movement bilaterally, CTAB RRR,No Gallops,Rubs or new Murmurs, No Parasternal Heave +ve B.Sounds, Abd LESS DISTENDED AND LESS TENDER RLQ, No organomegaly appriciated, No rebound - guarding or rigidity. No Cyanosis, Clubbing, 1+ edema in dependent areas, No new Rash or bruise     Data Review   Micro Results Recent Results (from the past 240 hour(s))  GC/CHLAMYDIA PROBE AMP     Status: None   Collection Time    09/19/12  6:39 PM      Result Value Range Status   CT Probe RNA NEGATIVE  NEGATIVE Final   GC Probe RNA NEGATIVE  NEGATIVE Final   Comment: (NOTE)                                                                                          Normal Reference Range: Negative           Assay performed using the Gen-Probe APTIMA COMBO2 (R) Assay.     Acceptable specimen types for this assay include APTIMA Swabs (Unisex,     endocervical, urethral, or vaginal), first void urine, and ThinPrep     liquid based cytology samples.  STOOL CULTURE     Status: None   Collection Time    09/20/12  3:31 AM      Result Value Range Status   Specimen Description STOOL   Final   Special Requests Immunocompromised   Final   Culture Culture reincubated for better growth   Final   Report Status PENDING   Incomplete  GC/CHLAMYDIA PROBE AMP     Status: None   Collection Time    09/21/12  5:11 PM       Result Value Range Status   CT Probe RNA NEGATIVE  NEGATIVE Final   GC Probe RNA NEGATIVE  NEGATIVE Final   Comment: (NOTE)                                                                                            Normal Reference Range: Negative           Assay performed using the Gen-Probe APTIMA COMBO2 (R) Assay.     Acceptable specimen types for this assay include APTIMA Swabs (Unisex,     endocervical, urethral, or vaginal), first void urine, and ThinPrep     liquid based cytology samples.    Radiology Reports US  Abdomen Complete  09/19/2012  *RADIOLOGY REPORT*  Clinical Data:  Right upper quadrant pain.  LIMITED ABDOMINAL ULTRASOUND - RIGHT UPPER QUADRANT  Comparison:  CT today.  Findings:  Gallbladder:  There are no gallstones.  No sonographic Murphy's sign.  Gallbladder wall is mildly thickened at 3.7 mm, with normal below the 3 mm.  This is nonspecific in a patient with HIV and could be due to either the patient's history of hepatitis or HIV.  Common bile duct:  Upper limits of normal for age, 6.5 mm.  Mild intrahepatic biliary ductal dilation is also demonstrated on CT.  Liver:  Cirrhotic.  Small amount of perihepatic ascites.  IMPRESSION: Mild gallbladder wall thickening which is nonspecific in this cirrhotic patient.  This could also be associated with HIV cholangiography.  No gallstones or common duct stone identified. Significantly, no sonographic Murphy's sign.                    Original Report Authenticated By: Andreas Newport, M.D.    Ct Abdomen Pelvis W Contrast  09/19/2012  *RADIOLOGY REPORT*  Clinical Data: Abdominal pain.  Right lower quadrant pain.  Pain radiating to the right flank.  History of HIV and hepatitis C.  CT ABDOMEN AND PELVIS WITH CONTRAST  Technique:  Multidetector CT imaging of the abdomen and pelvis was performed following the standard protocol during bolus administration of intravenous contrast.  Contrast: OMNIPAQUE IOHEXOL 300 MG/ML  SOLN  Comparison:  None.  Findings: Lung Bases: Dependent atelectasis.  Linear scarring in the right middle lobe adjacent to the heart.  Liver:  Hepatic cirrhosis.  Diffuse micronodular contour.  No focal mass lesion is identified.  Mild intrahepatic biliary ductal dilation.  Spleen:  Splenomegaly compatible with portal venous hypertension.  Gallbladder:  Grossly normal.  No calcified stones.  Common bile duct:  Normal for age.  Pancreas:  Mild prominence of pancreatic duct.  No focal mass lesions.  The pancreas appears full.  Adrenal glands:  Normal.  Kidneys:  Normal enhancement and excretion.  The ureters appear within normal limits.  Sub centimeter left inferior pole renal cyst.  Stomach:  Decompressed.  Numerous upper abdominal lymph nodes are present, which may be associated with HIV, congestive or reactive. Neoplastic nodes are a consideration.  Index node along the lesser curvature of the stomach measures 14 mm x 33 mm.  Small bowel:  No small bowel obstruction.  No definite mural thickening.  Colon:   Ileocecal valve appears normal.  The appendix is not identified.  Abnormal soft tissue is present in the right pericolic gutter.  There is circumferential mural thickening in the proximal ascending colon, with an element of obstruction likely.  This enhancing mass demonstrates several areas of low attenuation. Differential considerations include neoplasm such as primary colon cancer, lymphoma or colitis.  This is focal and restricted to the ascending colon.  There is no involvement of the transverse or descending colon.  Pelvic Genitourinary:  Grossly within normal limits.  Urinary bladder normal.  Portal venous collaterals and prominent hemorrhoidal vein.  Bones:  No AVN.  No aggressive osseous lesions.  Spondylosis.  Vasculature: Portal venous hypertension with collaterals in the upper abdomen.  Retroperitoneal collaterals are present as well. Recanalization of the periumbilical vein. Atherosclerosis of the aorta and iliac  vessels.  IMPRESSION: 1.  Hepatic cirrhosis with a small amount of ascites, splenomegaly and portal venous hypertension. 2.  Mass in the ascending colon is suspicious for neoplasm.  Focal colitis considered  less likely.  In this patient with HIV, colonic lymphoma is a definite consideration.  Adjacent soft tissue stranding extending into the right pericolic gutter raises the possibility of peritoneal spread of disease. 3.  Fullness of the pancreas with prominence of pancreatic duct. Findings suggestive of pancreatitis. 4.  Abdominal lymphadenopathy is nonspecific reactive, congestive, associated with HIV, or neoplastic in this patient with colonic mass.  Critical Value/emergent results were called by telephone at the time of interpretation on 09/19/2012 at 0141 hours to Dr. Manus Gunning, Jeannett Senior, who verbally acknowledged these results. We discussed the ultrasound dated been ordered but would likely add little based on the CT findings.   Original Report Authenticated By: Andreas Newport, M.D.     CBC  Recent Labs Lab 09/18/12 2335 09/19/12 0631 09/20/12 0355 09/23/12 0415  WBC 3.6* 2.7* 3.3* 3.1*  HGB 9.5* 8.1* 8.6* 8.4*  HCT 30.3* 26.3* 27.7* 27.6*  PLT 139* 123* 128* 151  MCV 79.3 79.5 79.6 81.7  MCH 24.9* 24.5* 24.7* 24.9*  MCHC 31.4 30.8 31.0 30.4  RDW 15.3 15.1 15.4 15.8*  LYMPHSABS 0.7 0.9  --   --   MONOABS 0.4 0.3  --   --   EOSABS 0.0 0.0  --   --   BASOSABS 0.0 0.0  --   --     Chemistries   Recent Labs Lab 09/18/12 2335 09/19/12 0631 09/20/12 0355 09/23/12 0415  NA 135 134* 134* 139  K 4.2 3.7 3.7 3.5  CL 102 103 103 105  CO2 25 27 22 31   GLUCOSE 113* 93 81 116*  BUN 8 6 5* 4*  CREATININE 0.50 0.50 0.52 0.56  CALCIUM 8.5 7.8* 7.7* 8.2*  AST 37 26 40*  --   ALT 17 13 16   --   ALKPHOS 106 83 74  --   BILITOT 0.3 0.3 0.4  --    ------------------------------------------------------------------------------------------------------------------ estimated creatinine  clearance is 72 ml/min (by C-G formula based on Cr of 0.56). ------------------------------------------------------------------------------------------------------------------ No results found for this basename: HGBA1C,  in the last 72 hours ------------------------------------------------------------------------------------------------------------------ No results found for this basename: CHOL, HDL, LDLCALC, TRIG, CHOLHDL, LDLDIRECT,  in the last 72 hours ------------------------------------------------------------------------------------------------------------------ No results found for this basename: TSH, T4TOTAL, FREET3, T3FREE, THYROIDAB,  in the last 72 hours ------------------------------------------------------------------------------------------------------------------ No results found for this basename: VITAMINB12, FOLATE, FERRITIN, TIBC, IRON, RETICCTPCT,  in the last 72 hours  Coagulation profile  Recent Labs Lab 09/18/12 2335 09/23/12 0415  INR 1.10 1.42    No results found for this basename: DDIMER,  in the last 72 hours  Cardiac Enzymes No results found for this basename: CK, CKMB, TROPONINI, MYOGLOBIN,  in the last 168 hours ------------------------------------------------------------------------------------------------------------------ No components found with this basename: POCBNP,

## 2012-09-23 NOTE — Op Note (Signed)
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 Colonoscopy has confirmed a mass and biopsy reveals invasive adenocarcinoma. I recommended proceeding with laparoscopic and possible open colectomy. We discussed the indications for the procedure and risks of general anesthesia, bleeding, infection, possible need for open procedure, and anastomotic leak and possible ostomy. All her questions were answered and she agrees and she is brought to the operating room for this procedure. She is already had a mechanical bowel prep prior to her colonoscopy. She is on IV antibiotics due to changes around the tumor worrisome for inflammation or infection    Procedure Detail:  Patient is brought to the operating room, placed in the supine position on the operating table, and general endotracheal anesthesia induced. PAS were in place. A Foley catheter was placed. The abdomen was widely sterilely prepped and draped. Patient timeout was performed and correct procedure verified. Access was obtained with a 1 cm incision in the midline above the umbilicus with an open Hassan technique through mattress suture of 0 Vicryl and pneumoperitoneum established. Under direct vision a 5 mm trocar was placed in the right lower quadrant and the left lower quadrant. The small bowel particularly distally was mildly to moderately distended.  There was a small amount of ascites. The liver was enlarged and nodular. There was a large firm mass  in the right colon near the hepatic flexure with fixation to the lateral peritoneum and retraction back into the retroperitoneum. The colon proximal to this was distended. Due to this fixation and distended bowel I did not feel that this could be at all safely handled laparoscopically. The procedure was converted to open using a right upper quadrant transverse incision. There was a large mass palpable again with fixation into the lateral peritoneum and foreshortening into the retroperitoneum. Initially I mobilized the terminal ileum and proximal right colon dividing lateral peritoneal attachments with cautery. A point on the mid transverse colon just proximal to the middle colic vessels was chosen and this area was cleared of omentum and the omentum divided with the LigaSure over toward the mass. The proximal transverse colon was mobilized entering the lesser sac and dividing a portion of the lesser omentum with the LigaSure were and then coming down along the hepatic flexure dividing attachments up to the gallbladder and the hepatic colic ligament. The tumor itself was in the right colon just proximal to the hepatic flexure. I carried the peritoneal incision with cautery out lateral to the mass that was locally invading the lateral peritoneum. I mobilized this area as much as possible but it remained quite fixed and hidrosis fascia and the mesentery felt foreshortened I felt it would be 0 to approach this after dividing the bowel proximally and distally. Areas of proximal and distal resection of the terminal ileum and mid to proximal transverse colon were chosen and each divided with a single firing of the GIA 75 mm stapler. I then divided the more mobile portions of the mesentery working distally from the ileal transection toward the tumor and proximally from the colon transection toward the tumor.  This did allow somewhat more easy retraction of the mass laterally and I carefully dissected down around the mass and  resecting lateral peritoneum and dissecting down to toward and through Gerota's fascia. This was done with the LigaSure. Slowly I was able to mobilize the mass up out of the lateral retroperitoneum. At this point I was able to clearly identify the duodenum which was mobilized medially away from the colon and carefully protected. I then worked back underneath the tumor laterally and some final retroperitoneal attachments were able to be divided and then we could lift  The tumor up and clearly identify the right colic pedicle which was not really severely involved by tumor. The mesentery was then finally divided coming across the ileocolic pedicle and suture ligating this proximally and taking the right colic pedicle near its origin and suture ligating this and the specimen was removed. The abdomen was thoroughly irrigated. It was inspected for hemostasis and there was some mild oozing from the retroperitoneal dissection but no active bleeding. This was packed. A wound protector was placed prior to opening the bowel for anastomosis. A functional end-to-end anastomosis was created between the terminal ileum and the transverse colon with a single firing of the GIA 75 mm stapler. The staple line was intact and without bleeding. The common enterotomy was then closed with a single firing of the TA 60 stapler. The anastomosis was airtight to pressure. The crotch of the anastomosis was reinforced with 2-0 silk as was the TA 60 staple line. The mesentery was closed with interrupted 3-0 silk. Following this all instruments and drapes were changed after removing the wound protector. We irrigated the abdomen thoroughly and there appeared to be good hemostasis. The viscera returned to their anatomic position. The wound was closed in layers with running 0 Novafil. The subcutaneous tissue was irrigated and skin closed with staples. Sponge needle and instrument counts were correct.  Findings: As above  Estimated Blood Loss:   600 cc         Drains: none  Blood Given: 2 units  PRBC and 2 units FFP          Specimens: right colon        Complications:  * No complications entered in OR log *         Disposition: PACU - hemodynamically stable.         Condition: stable

## 2012-09-23 NOTE — Progress Notes (Signed)
Quite after Versed given by anes. Had been yelling and trying to get up.

## 2012-09-24 ENCOUNTER — Encounter (HOSPITAL_COMMUNITY): Payer: Self-pay | Admitting: General Surgery

## 2012-09-24 DIAGNOSIS — B20 Human immunodeficiency virus [HIV] disease: Secondary | ICD-10-CM

## 2012-09-24 DIAGNOSIS — F17200 Nicotine dependence, unspecified, uncomplicated: Secondary | ICD-10-CM

## 2012-09-24 LAB — PREPARE FRESH FROZEN PLASMA: Unit division: 0

## 2012-09-24 LAB — CBC
HCT: 33.7 % — ABNORMAL LOW (ref 36.0–46.0)
Hemoglobin: 10.6 g/dL — ABNORMAL LOW (ref 12.0–15.0)
MCH: 25.7 pg — ABNORMAL LOW (ref 26.0–34.0)
MCV: 81.8 fL (ref 78.0–100.0)
RBC: 4.12 MIL/uL (ref 3.87–5.11)

## 2012-09-24 LAB — OVA AND PARASITE EXAMINATION

## 2012-09-24 LAB — BASIC METABOLIC PANEL
CO2: 30 mEq/L (ref 19–32)
Calcium: 8.3 mg/dL — ABNORMAL LOW (ref 8.4–10.5)
Creatinine, Ser: 0.45 mg/dL — ABNORMAL LOW (ref 0.50–1.10)
Glucose, Bld: 156 mg/dL — ABNORMAL HIGH (ref 70–99)

## 2012-09-24 LAB — STOOL CULTURE

## 2012-09-24 MED ORDER — ELVITEG-COBIC-EMTRICIT-TENOFDF 150-150-200-300 MG PO TABS
1.0000 | ORAL_TABLET | Freq: Every day | ORAL | Status: DC
  Administered 2012-09-24 – 2012-10-09 (×16): 1 via ORAL
  Filled 2012-09-24 (×18): qty 1

## 2012-09-24 MED ORDER — ENOXAPARIN SODIUM 40 MG/0.4ML ~~LOC~~ SOLN
40.0000 mg | SUBCUTANEOUS | Status: DC
  Filled 2012-09-24: qty 0.4

## 2012-09-24 MED ORDER — FENTANYL 50 MCG/HR TD PT72
50.0000 ug | MEDICATED_PATCH | TRANSDERMAL | Status: DC
  Administered 2012-09-24 – 2012-09-27 (×2): 50 ug via TRANSDERMAL
  Filled 2012-09-24 (×2): qty 1

## 2012-09-24 MED ORDER — KCL IN DEXTROSE-NACL 10-5-0.45 MEQ/L-%-% IV SOLN
INTRAVENOUS | Status: DC
  Filled 2012-09-24: qty 1000

## 2012-09-24 MED ORDER — KCL IN DEXTROSE-NACL 10-5-0.45 MEQ/L-%-% IV SOLN
INTRAVENOUS | Status: AC
  Administered 2012-09-24: 75 mL/h via INTRAVENOUS
  Filled 2012-09-24 (×4): qty 1000

## 2012-09-24 NOTE — Progress Notes (Addendum)
Patient ID: Ruth Stephenson, female   DOB: 1951/06/06, 62 y.o.   MRN: [id number] 1 Day Post-Op  Subjective: Complaining of incisional pain and crying but calms down when speaking with her.  Denies nausea.  Objective: Vital signs in last 24 hours: Temp:  [97 F (36.1 C)-98.9 F (37.2 C)] 98.9 F (37.2 C) (04/01 0400) Pulse Rate:  [79-96] 96 (04/01 0800) Resp:  [13-26] 15 (04/01 0800) BP: (105-162)/(55-94) 120/77 mmHg (04/01 0800) SpO2:  [87 %-100 %] 96 % (04/01 0800) Weight:  [174 lb 6.1 oz (79.1 kg)] 174 lb 6.1 oz (79.1 kg) (04/01 0550) Last BM Date: 09/23/12  Intake/Output from previous day: 03/31 0701 - 04/01 0700 In: 7540.3 [I.V.:5808.3; Blood:1282; IV Piggyback:150] Out: 1780 [Urine:930; Blood:650] Intake/Output this shift: Total I/O In: 100 [I.V.:100] Out: 150 [Urine:150]  General appearance: alert, cooperative and moderate distress Lungs: breath sounds clear and equal without increased work of breathing GI: mild distention. Generally soft with appropriate incisional tenderness. Incision/Wound: clean and dry  Lab Results:   Recent Labs  09/23/12 1600 09/24/12 0345  WBC 9.0 7.7  HGB 11.1* 10.6*  HCT 34.7* 33.7*  PLT 149* 170   BMET  Recent Labs  09/23/12 0415 09/24/12 0345  NA 139 138  K 3.5 4.3  CL 105 104  CO2 31 30  GLUCOSE 116* 156*  BUN 4* 5*  CREATININE 0.56 0.45*  CALCIUM 8.2* 8.3*     Studies/Results: No results found.  Anti-infectives: Anti-infectives   Start     Dose/Rate Route Frequency Ordered Stop   09/24/12 1000  elvitegravir-cobicistat-emtricitabine-tenofovir (STRIBILD) 150-150-200-300 MG tablet 1 tablet     1 tablet Oral Daily with breakfast 09/24/12 0826     09/20/12 1400  piperacillin-tazobactam (ZOSYN) IVPB 3.375 g     3.375 g 12.5 mL/hr over 240 Minutes Intravenous 3 times per day 09/20/12 1331     09/19/12 1600  sulfamethoxazole-trimethoprim (BACTRIM,SEPTRA) 400-80 MG per tablet 1 tablet     1 tablet Oral Daily 09/19/12  1524     09/19/12 1200  cefoTAXime (CLAFORAN) 1 g in dextrose 5 % 50 mL IVPB  Status:  Discontinued     1 g 100 mL/hr over 30 Minutes Intravenous 4 times per day 09/19/12 0912 09/20/12 1323   09/19/12 1000  metroNIDAZOLE (FLAGYL) tablet 500 mg  Status:  Discontinued     500 mg Oral 3 times per day 09/19/12 0906 09/20/12 1323   09/19/12 1000  ciprofloxacin (CIPRO) IVPB 400 mg  Status:  Discontinued     400 mg 200 mL/hr over 60 Minutes Intravenous Every 12 hours 09/19/12 0912 09/19/12 0935   09/19/12 0800  elvitegravir-cobicistat-emtricitabine-tenofovir (STRIBILD) 150-150-200-300 MG tablet 1 tablet  Status:  Discontinued     1 tablet Oral Daily with breakfast 09/19/12 0512 09/23/12 1601   09/19/12 0600  cefTRIAXone (ROCEPHIN) 1 g in dextrose 5 % 50 mL IVPB  Status:  Discontinued     1 g 100 mL/hr over 30 Minutes Intravenous Every 24 hours 09/19/12 0512 09/19/12 0906      Assessment/Plan: s/p Procedure(s): LAPAROSCOPIC RIGHT COLON RESECTION    Doing reasonably well postoperatively without evidence of bleeding or other major complication. Poor pain control which is not unexpected. We'll increase frequency of pain meds. Continue n.p.o. For now. Out of bed. Her colon was not perforated I did not find any evidence of active infection in her abdomen. Would consider stopping antibiotics unless there is another indication. Note: Patient had significant intraoperative bleeding and is at  high risk for bleeding due to cirrhosis and portal hypertension and I would hold anticoagulation for now.   LOS: 6 days    Mirage Pfefferkorn T 09/24/2012

## 2012-09-24 NOTE — Progress Notes (Signed)
Patient received from stepdown, alert and oriented, complaining of 10/10 pain in abdomen.  Abdomen is distended, firm, no bowel sounds, not passing flatus per patient.  Foley removed at 12 noon and has not voided at this time.  Abdominal dressings 3 x gauze dd&i, 1 x honeycomb dressing has some very small amount of dried drainage present.  Oriented patient to the room, call light and discussed pain management.

## 2012-09-24 NOTE — Progress Notes (Signed)
Patient ID: Ruth Stephenson, female   DOB: Jul 01, 1950, 62 y.o.   MRN: [id number]         [facility]    Date of Admission:  09/18/2012   Total days of antibiotics 6         Principal Problem:   Abdominal pain Active Problems:   Pancreatitis   Colonic mass - cecum   HIV (human immunodeficiency virus infection)   Hepatitis C   Tobacco abuse   Anemia   . elvitegravir-cobicistat-emtricitabine-tenofovir  1 tablet Oral Q breakfast  . fentaNYL  50 mcg Transdermal Q72H  . furosemide  20 mg Intravenous Once  . pantoprazole (PROTONIX) IV  40 mg Intravenous Q24H  . piperacillin-tazobactam (ZOSYN)  IV  3.375 g Intravenous Q8H  . sodium chloride  10-40 mL Intracatheter Q12H  . sulfamethoxazole-trimethoprim  1 tablet Oral Daily   Objective: Temp:  [97 F (36.1 C)-98.9 F (37.2 C)] 97.6 F (36.4 C) (04/01 1200) Pulse Rate:  [79-106] 106 (04/01 1320) Resp:  [13-26] 23 (04/01 1320) BP: (120-162)/(68-94) 158/88 mmHg (04/01 1300) SpO2:  [89 %-100 %] 89 % (04/01 1320) Weight:  [79.1 kg (174 lb 6.1 oz)] 79.1 kg (174 lb 6.1 oz) (04/01 0550)  General: She is sitting up on the bedside commode. I did not examine her today.  Lab Results Lab Results  Component Value Date   WBC 7.7 09/24/2012   HGB 10.6* 09/24/2012   HCT 33.7* 09/24/2012   MCV 81.8 09/24/2012   PLT 170 09/24/2012    Lab Results  Component Value Date   CREATININE 0.45* 09/24/2012   BUN 5* 09/24/2012   NA 138 09/24/2012   K 4.3 09/24/2012   CL 104 09/24/2012   CO2 30 09/24/2012    HIV 1 RNA Quant (copies/mL)  Date Value  09/19/2012 41785*     CD4 T Cell Abs (cmm)  Date Value  09/19/2012 150*   Assessment: There is no evidence of intestinal perforation were infection found at the time of her partial colectomy yesterday.. Her empiric piperacillin tazobactam.  Plan: 1. Discontinue piperacillin tazobactam 2. Continue Stribild and trimethoprim sulfamethoxazole  Cliffton Asters, MD [facility] [phone] pager   [phone] cell 09/24/2012, 2:05 PM

## 2012-09-24 NOTE — Progress Notes (Signed)
[facility]                                                                                Patient Demographics  Ruth Stephenson, is a 62 y.o. female, DOB - 27-Aug-1950, ZOX:[id number], WJX:[id number]  Admit date - 09/18/2012  Admitting Physician Eduard Clos, MD  Outpatient Primary MD for the patient is No primary provider on file.  LOS - 6   Chief Complaint  Patient presents with  . Abdominal Pain        Assessment & Plan   Summary   This is a 62 year old [place] American female with history of hep C and HIV for the last 20 years, who was recently moved from [place] about a month ago, she had been off of her HIV medications for the last 1 month due to the move. She presented here a few days ago with right-sided abdominal pain and discomfort, CT of the abdomen suggestive of large ascending colon mass suggestive of adenocarcinoma, she was seen by GI, oncology ID and general surgery. She underwent diagnostic colonoscopy by Dr. Elnoria Howard, reports are suggestive of adenocarcinoma, oncology and general surgery following. She also underwent partial colectomy by Dr. Jeanella Flattery on 09/23/2012.  He has developed mild edema and accommodation of fluid overload and low albumin from cirrhosis, improved post gentle Lasix. Currently receiving gentle IV fluids as n.p.o. Will try to stop IV fluids as soon as she is clear to take oral diet by general surgery.     1. Abdominal pain noted in the right lower quadrant. Patient also has history of HIV, hep C with cirrhosis, CT suggestive of ascending colon mass. Seen by general surgery, GI and oncology along with ID. Underwent diagnostic colonoscopy by Dr. Elnoria Howard on 09/19/2012 preliminary biopsy results show invasive adenocarcinoma. General surgery took her to the OR for partial colectomy on 09/23/2012, oncology following closely. Will await for general surgery and oncology to come in today on future course of action.     2. HIV - noted  high viral load and CD4 count is low, on Bactrim per ID currently n.p.o. will defer if Bactrim needs to be switched to IV form. Also will defer the need of Zosyn at this time to ID.Marland Kitchen Continue present home HIV medications once taking by mouth. She has been out of for HIV medications for the last 1 month since she moved from [place], ID following the patient here. GC and Chlamydia probes are negative with RPR -ve. HIV genotype is pending.     3. History of hepatitis C - probably has cirrhosis presently per the CAT scan. Consulted GI.      4. Anemia - likely anemia of chronic disease plus dilutional fall from IV fluids + Iron deficiency on An panel, stool for occult blood done by the ER physician was negative. Replaced IV Venofer and then IV ferahaem by hematology oncology, table H&H continue to monitor  H&H, Type and screen has been done. For now continue Protonix IV.     5. Tobacco abuse - advised to quit smoking.     6. Thrombocytopenia. Likely cirrhosis related, we will monitor, stable around 120s, gentle lovenox and monitor.  Lab Results  Component Value Date   PLT 170 09/24/2012     7.Mild fluid OD, - much better after gentle Lasix on 09/23/2012, cirrhosis worsens her edema, minimal IV fluid as she is n.p.o. Stop IV fluids once oral diet has been resumed.    Code Status: full  Family Communication: patient  Disposition Plan: home   Procedures CT abd Pelvis, Abd [place], colonoscopy, partial colectomy on 09/23/2012, left arm PICC line placed 09/22/2012.   Consults  Gi, ID, CCS, Oncology   DVT Prophylaxis    SCDs  - have reordered Lovenox after her surgery, was sure to be given postop on 09/24/2012 3 PM. Will have nurse check with general surgery prior to giving it.  Lab Results  Component Value Date   PLT 170 09/24/2012    Medications  Scheduled Meds: . elvitegravir-cobicistat-emtricitabine-tenofovir  1 tablet Oral Q breakfast  . fentaNYL  50 mcg Transdermal Q72H   . furosemide  20 mg Intravenous Once  . pantoprazole (PROTONIX) IV  40 mg Intravenous Q24H  . piperacillin-tazobactam (ZOSYN)  IV  3.375 g Intravenous Q8H  . sodium chloride  10-40 mL Intracatheter Q12H  . sulfamethoxazole-trimethoprim  1 tablet Oral Daily   Continuous Infusions: . dextrose 5 % and 0.45 % NaCl with KCl 10 mEq/L     PRN Meds:.HYDROmorphone (DILAUDID) injection, ondansetron (ZOFRAN) IV, sodium chloride  Antibiotics     Anti-infectives   Start     Dose/Rate Route Frequency Ordered Stop   09/24/12 1000  elvitegravir-cobicistat-emtricitabine-tenofovir (STRIBILD) 150-150-200-300 MG tablet 1 tablet     1 tablet Oral Daily with breakfast 09/24/12 0826     09/20/12 1400  piperacillin-tazobactam (ZOSYN) IVPB 3.375 g     3.375 g 12.5 mL/hr over 240 Minutes Intravenous 3 times per day 09/20/12 1331     09/19/12 1600  sulfamethoxazole-trimethoprim (BACTRIM,SEPTRA) 400-80 MG per tablet 1 tablet     1 tablet Oral Daily 09/19/12 1524     09/19/12 1200  cefoTAXime (CLAFORAN) 1 g in dextrose 5 % 50 mL IVPB  Status:  Discontinued     1 g 100 mL/hr over 30 Minutes Intravenous 4 times per day 09/19/12 0912 09/20/12 1323   09/19/12 1000  metroNIDAZOLE (FLAGYL) tablet 500 mg  Status:  Discontinued     500 mg Oral 3 times per day 09/19/12 0906 09/20/12 1323   09/19/12 1000  ciprofloxacin (CIPRO) IVPB 400 mg  Status:  Discontinued     400 mg 200 mL/hr over 60 Minutes Intravenous Every 12 hours 09/19/12 0912 09/19/12 0935   09/19/12 0800  elvitegravir-cobicistat-emtricitabine-tenofovir (STRIBILD) 150-150-200-300 MG tablet 1 tablet  Status:  Discontinued     1 tablet Oral Daily with breakfast 09/19/12 0512 09/23/12 1601   09/19/12 0600  cefTRIAXone (ROCEPHIN) 1 g in dextrose 5 % 50 mL IVPB  Status:  Discontinued     1 g 100 mL/hr over 30 Minutes Intravenous Every 24 hours 09/19/12 0512 09/19/12 0906       Time Spent in minutes   35   SINGH,PRASHANT K M.D on 09/24/2012 at 8:58  AM  Between 7am to 7pm - Pager - [phone]  After 7pm go to [email or web address] - password [email or web address]  And look for the night coverage person covering for me after hours  [facility] Office  [phone]    Subjective:   Ruth Stephenson today has, No headache, No chest pain, has mild generalized abdominal pain - No Nausea, No new weakness tingling or numbness, No Cough -  SOB.   Objective:   Filed Vitals:   09/24/12 0550 09/24/12 0600 09/24/12 0700 09/24/12 0800  BP:  134/74 132/79 120/77  Pulse:  87 93 96  Temp:      TempSrc:      Resp:  15 14 15   Height:      Weight: 79.1 kg (174 lb 6.1 oz)     SpO2:  96% 95% 96%    Wt Readings from Last 3 Encounters:  09/24/12 79.1 kg (174 lb 6.1 oz)  09/24/12 79.1 kg (174 lb 6.1 oz)  09/24/12 79.1 kg (174 lb 6.1 oz)     Intake/Output Summary (Last 24 hours) at 09/24/12 0858 Last data filed at 09/24/12 0800  Gross per 24 hour  Intake 7640.33 ml  Output   1930 ml  Net 5710.33 ml    Exam Awake Alert, Oriented X 3, No new F.N deficits, Normal affect [place].AT,PERRAL Supple Neck,No JVD, No cervical lymphadenopathy appriciated.  Symmetrical Chest wall movement, Good air movement bilaterally, CTAB RRR,No Gallops,Rubs or new Murmurs, No Parasternal Heave Hypoactive bowel sounds, abdomen is less distended, postop scar appears clean, No organomegaly appriciated, No rebound - guarding or rigidity. No Cyanosis, Clubbing, 1+ edema in dependent areas, No new Rash or bruise     Data Review   Micro Results Recent Results (from the past 240 hour(s))  GC/CHLAMYDIA PROBE AMP     Status: None   Collection Time    09/19/12  6:39 PM      Result Value Range Status   CT Probe RNA NEGATIVE  NEGATIVE Final   GC Probe RNA NEGATIVE  NEGATIVE Final   Comment: (NOTE)                                                                                          Normal Reference Range: Negative           Assay performed using the Gen-Probe APTIMA COMBO2  (R) Assay.     Acceptable specimen types for this assay include APTIMA Swabs (Unisex,     endocervical, urethral, or vaginal), first void urine, and ThinPrep     liquid based cytology samples.  STOOL CULTURE     Status: None   Collection Time    09/20/12  3:31 AM      Result Value Range Status   Specimen Description STOOL   Final   Special Requests Immunocompromised   Final   Culture Culture reincubated for better growth   Final   Report Status PENDING   Incomplete  GC/CHLAMYDIA PROBE AMP     Status: None   Collection Time    09/21/12  5:11 PM      Result Value Range Status   CT Probe RNA NEGATIVE  NEGATIVE Final   GC Probe RNA NEGATIVE  NEGATIVE Final   Comment: (NOTE)  Normal Reference Range: Negative           Assay performed using the Gen-Probe APTIMA COMBO2 (R) Assay.     Acceptable specimen types for this assay include APTIMA Swabs (Unisex,     endocervical, urethral, or vaginal), first void urine, and ThinPrep     liquid based cytology samples.    Radiology Reports US Abdomen Complete  09/19/2012  *RADIOLOGY REPORT*  Clinical Data:  Right upper quadrant pain.  LIMITED ABDOMINAL ULTRASOUND - RIGHT UPPER QUADRANT  Comparison:  CT today.  Findings:  Gallbladder:  There are no gallstones.  No sonographic Murphy's sign.  Gallbladder wall is mildly thickened at 3.7 mm, with normal below the 3 mm.  This is nonspecific in a patient with HIV and could be due to either the patient's history of hepatitis or HIV.  Common bile duct:  Upper limits of normal for age, 6.5 mm.  Mild intrahepatic biliary ductal dilation is also demonstrated on CT.  Liver:  Cirrhotic.  Small amount of perihepatic ascites.  IMPRESSION: Mild gallbladder wall thickening which is nonspecific in this cirrhotic patient.  This could also be associated with HIV cholangiography.  No gallstones or common duct stone identified.  Significantly, no sonographic Murphy's sign.                    Original Report Authenticated By: Andreas Newport, M.D.    Ct Abdomen Pelvis W Contrast  09/19/2012  *RADIOLOGY REPORT*  Clinical Data: Abdominal pain.  Right lower quadrant pain.  Pain radiating to the right flank.  History of HIV and hepatitis C.  CT ABDOMEN AND PELVIS WITH CONTRAST  Technique:  Multidetector CT imaging of the abdomen and pelvis was performed following the standard protocol during bolus administration of intravenous contrast.  Contrast: OMNIPAQUE IOHEXOL 300 MG/ML  SOLN  Comparison: None.  Findings: Lung Bases: Dependent atelectasis.  Linear scarring in the right middle lobe adjacent to the heart.  Liver:  Hepatic cirrhosis.  Diffuse micronodular contour.  No focal mass lesion is identified.  Mild intrahepatic biliary ductal dilation.  Spleen:  Splenomegaly compatible with portal venous hypertension.  Gallbladder:  Grossly normal.  No calcified stones.  Common bile duct:  Normal for age.  Pancreas:  Mild prominence of pancreatic duct.  No focal mass lesions.  The pancreas appears full.  Adrenal glands:  Normal.  Kidneys:  Normal enhancement and excretion.  The ureters appear within normal limits.  Sub centimeter left inferior pole renal cyst.  Stomach:  Decompressed.  Numerous upper abdominal lymph nodes are present, which may be associated with HIV, congestive or reactive. Neoplastic nodes are a consideration.  Index node along the lesser curvature of the stomach measures 14 mm x 33 mm.  Small bowel:  No small bowel obstruction.  No definite mural thickening.  Colon:   Ileocecal valve appears normal.  The appendix is not identified.  Abnormal soft tissue is present in the right pericolic gutter.  There is circumferential mural thickening in the proximal ascending colon, with an element of obstruction likely.  This enhancing mass demonstrates several areas of low attenuation. Differential considerations include neoplasm such  as primary colon cancer, lymphoma or colitis.  This is focal and restricted to the ascending colon.  There is no involvement of the transverse or descending colon.  Pelvic Genitourinary:  Grossly within normal limits.  Urinary bladder normal.  Portal venous collaterals and prominent hemorrhoidal vein.  Bones:  No AVN.  No aggressive osseous lesions.  Spondylosis.  Vasculature: Portal venous hypertension with collaterals in the upper abdomen.  Retroperitoneal collaterals are present as well. Recanalization of the periumbilical vein. Atherosclerosis of the aorta and iliac vessels.  IMPRESSION: 1.  Hepatic cirrhosis with a small amount of ascites, splenomegaly and portal venous hypertension. 2.  Mass in the ascending colon is suspicious for neoplasm.  Focal colitis considered less likely.  In this patient with HIV, colonic lymphoma is a definite consideration.  Adjacent soft tissue stranding extending into the right pericolic gutter raises the possibility of peritoneal spread of disease. 3.  Fullness of the pancreas with prominence of pancreatic duct. Findings suggestive of pancreatitis. 4.  Abdominal lymphadenopathy is nonspecific reactive, congestive, associated with HIV, or neoplastic in this patient with colonic mass.  Critical Value/emergent results were called by telephone at the time of interpretation on 09/19/2012 at 0141 hours to Dr. Manus Gunning, Jeannett Senior, who verbally acknowledged these results. We discussed the ultrasound dated been ordered but would likely add little based on the CT findings.   Original Report Authenticated By: Andreas Newport, M.D.     [facility]  Recent Labs Lab 09/18/12 2335 09/19/12 0631 09/20/12 0355 09/23/12 0415 09/23/12 1600 09/24/12 0345  WBC 3.6* 2.7* 3.3* 3.1* 9.0 7.7  HGB 9.5* 8.1* 8.6* 8.4* 11.1* 10.6*  HCT 30.3* 26.3* 27.7* 27.6* 34.7* 33.7*  PLT 139* 123* 128* 151 149* 170  MCV 79.3 79.5 79.6 81.7 82.4 81.8  MCH 24.9* 24.5* 24.7* 24.9* 26.4 25.7*  MCHC 31.4 30.8 31.0  30.4 32.0 31.5  RDW 15.3 15.1 15.4 15.8* 15.5 15.9*  LYMPHSABS 0.7 0.9  --   --   --   --   MONOABS 0.4 0.3  --   --   --   --   EOSABS 0.0 0.0  --   --   --   --   BASOSABS 0.0 0.0  --   --   --   --     Chemistries   Recent Labs Lab 09/18/12 2335 09/19/12 0631 09/20/12 0355 09/23/12 0415 09/24/12 0345  NA 135 134* 134* 139 138  K 4.2 3.7 3.7 3.5 4.3  CL 102 103 103 105 104  CO2 25 27 22 31 30   GLUCOSE 113* 93 81 116* 156*  BUN 8 6 5* 4* 5*  CREATININE 0.50 0.50 0.52 0.56 0.45*  CALCIUM 8.5 7.8* 7.7* 8.2* 8.3*  AST 37 26 40*  --   --   ALT 17 13 16   --   --   ALKPHOS 106 83 74  --   --   BILITOT 0.3 0.3 0.4  --   --    ------------------------------------------------------------------------------------------------------------------ estimated creatinine clearance is 75.3 ml/min (by C-G formula based on Cr of 0.45). ------------------------------------------------------------------------------------------------------------------ No results found for this basename: HGBA1C,  in the last 72 hours ------------------------------------------------------------------------------------------------------------------ No results found for this basename: CHOL, HDL, LDLCALC, TRIG, CHOLHDL, LDLDIRECT,  in the last 72 hours ------------------------------------------------------------------------------------------------------------------ No results found for this basename: TSH, T4TOTAL, FREET3, T3FREE, THYROIDAB,  in the last 72 hours ------------------------------------------------------------------------------------------------------------------ No results found for this basename: VITAMINB12, FOLATE, FERRITIN, TIBC, IRON, RETICCTPCT,  in the last 72 hours  Coagulation profile  Recent Labs Lab 09/18/12 2335 09/23/12 0415  INR 1.10 1.42    No results found for this basename: DDIMER,  in the last 72 hours  Cardiac Enzymes No results found for this basename: CK, CKMB, TROPONINI,  MYOGLOBIN,  in the last 168 hours ------------------------------------------------------------------------------------------------------------------ No components found with this basename: POCBNP,

## 2012-09-25 ENCOUNTER — Inpatient Hospital Stay (HOSPITAL_COMMUNITY): Payer: Medicaid - Out of State

## 2012-09-25 NOTE — Progress Notes (Signed)
PROGRESS NOTE  [name]:[id number] DOB: 11-Jul-1950 DOA: 09/18/2012 PCP: No primary provider on file.  Brief narrative: 62 yr old female with HIV, Hep C, TOb abuse, anemia admitted on 3.27.14 for abdominal pain-thoguht initally to be pancreatitis vs Asc colon mass.  GI DR. 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  Surgery was consulted and she underwent a Open R hemicolectomy 3.31.1 and was transfused 2 U PRBC and 2 U FFP She has had moderate to high pain needs and has been somewhat distended since surgery and is still NPO   Past medical history-As per Problem list Chart reviewed as below- Non in EPIC  Consultants:  Gen surgery  Oncology  GI  Procedures:  Colonoscopy 3.28  R hemicolectomy 3.31  Antibiotics:  None currently    Subjective  Alert, well.  States pain is about 8/10 currently, but then seems to fall asleep while talking to me  No n/v.  Really wishes to eat.  No flatus, no stool Ambulatory   Objective    Interim History: none  Telemetry: NON-tele  Objective: Filed Vitals:   09/24/12 1610 09/24/12 2200 09/25/12 0500 09/25/12 0600  BP: 155/90 153/83  147/67  Pulse: 95 80  87  Temp: 98.2 F (36.8 C) 97.6 F (36.4 C)  97.4 F (36.3 C)  TempSrc: Oral Oral  Oral  Resp: 24   20  Height:      Weight:   79 kg (174 lb 2.6 oz)   SpO2: 100% 97%  97%    Intake/Output Summary (Last 24 hours) at 09/25/12 1225 Last data filed at 09/25/12 0934  Gross per 24 hour  Intake 1038.75 ml  Output   1250 ml  Net -211.25 ml    Exam:  General: alert pleasant AAF in NAd Cardiovascular: s1 s2 no m/r/g Respiratory: clear, no added sound Abdomen: distended ++, Sutures appear clean and intact.  BS decreased.  Non tympanitic Skin nad Neuro grossly intact  Data Reviewed: Basic Metabolic Panel:  Recent Labs Lab 09/18/12 2335  09/19/12 0631 09/20/12 0355 09/23/12 0415 09/24/12 0345  NA 135 134* 134* 139 138  K 4.2 3.7 3.7 3.5 4.3  CL 102 103 103 105 104  CO2 25 27 22 31 30   GLUCOSE 113* 93 81 116* 156*  BUN 8 6 5* 4* 5*  CREATININE 0.50 0.50 0.52 0.56 0.45*  CALCIUM 8.5 7.8* 7.7* 8.2* 8.3*   Liver Function Tests:  Recent Labs Lab 09/18/12 2335 09/19/12 0631 09/20/12 0355  AST 37 26 40*  ALT 17 13 16   ALKPHOS 106 83 74  BILITOT 0.3 0.3 0.4  PROT 7.3 6.6 6.6  ALBUMIN 2.3* 2.0* 2.1*    Recent Labs Lab 09/18/12 2335 09/19/12 0631  LIPASE 99* 80*   No results found for this basename: AMMONIA,  in the last 168 hours CBC:  Recent Labs Lab 09/18/12 2335 09/19/12 0631 09/20/12 0355 09/23/12 0415 09/23/12 1600 09/24/12 0345  WBC 3.6* 2.7* 3.3* 3.1* 9.0 7.7  NEUTROABS 2.5 1.4*  --   --   --   --   HGB 9.5* 8.1* 8.6* 8.4* 11.1* 10.6*  HCT 30.3* 26.3* 27.7* 27.6* 34.7* 33.7*  MCV 79.3 79.5 79.6 81.7 82.4 81.8  PLT 139* 123* 128* 151 149* 170   Cardiac Enzymes: No results found for this basename: CKTOTAL, CKMB, CKMBINDEX, TROPONINI,  in the last 168 hours BNP: No  components found with this basename: POCBNP,  CBG: No results found for this basename: GLUCAP,  in the last 168 hours     Studies:              All Imaging reviewed and is as per above notation   Scheduled Meds: . elvitegravir-cobicistat-emtricitabine-tenofovir  1 tablet Oral Q breakfast  . fentaNYL  50 mcg Transdermal Q72H  . furosemide  20 mg Intravenous Once  . pantoprazole (PROTONIX) IV  40 mg Intravenous Q24H  . sodium chloride  10-40 mL Intracatheter Q12H  . sulfamethoxazole-trimethoprim  1 tablet Oral Daily   Continuous Infusions: . dextrose 5 % and 0.45 % NaCl with KCl 10 mEq/L 75 mL/hr (09/24/12 1209)     Assessment/Plan: 1. Invasive Adenoca colon per Pathology report-Per Med oncology-suspect will need Adj chemo when heals from surgery-CEA was 2.0 on 09/23/12 2. Abd pain-2/2 to surgery-monitor.  Currently  on Dilaudid 2-3 mg q1 prn.  Continue Fentanyl pacth 50 mcg-careful with sedation as she appeared a little sleepy on exam this am.  AXR done 4.2 shows normal bowel gas- expect distension will get better with increasing mobility 3. HIV-CD 4 count 150, Viral Load 41,785-Per ID will continue Bactrim daily and Stribild.  ?No other prophylaxis needed 4. H/o Hep C-Will needs further discussion with [facility] on discharge.  LFT's stable but low pre-albumin 5. ABLA vs AoCD-transfused during surgery-Hb stable.  Threshold to transfuse would be below 7 6. Mod TCP-2/2 to HIv/Hep C and blood loss.  Monitor with am labs  Code Status: Full Family Communication:  Discussed with son who was present Disposition Plan:  In-patient   Pleas Koch, MD  [facility] Pager [phone] 09/25/2012, 12:25 PM    LOS: 7 days

## 2012-09-25 NOTE — Progress Notes (Signed)
2 Days Post-Op  Subjective: Very distended and miserable, not really understanding what is wrong.  Knows she had surgery but couldn't remember when.  Objective: Vital signs in last 24 hours: Temp:  [97.4 F (36.3 C)-98.2 F (36.8 C)] 97.4 F (36.3 C) (04/02 0600) Pulse Rate:  [80-106] 87 (04/02 0600) Resp:  [17-24] 20 (04/02 0600) BP: (147-163)/(67-90) 147/67 mmHg (04/02 0600) SpO2:  [89 %-100 %] 97 % (04/02 0600) Weight:  [174 lb 2.6 oz (79 kg)] 174 lb 2.6 oz (79 kg) (04/02 0500) Last BM Date: 09/23/12  Diet: Clears.  Nothing by mouth recorded, Afebrile vital signs are stable, slightly tachycardic. Labs are stable.   Intake/Output from previous day: 04/01 0701 - 04/02 0700 In: 1688.8 [I.V.:1638.8; IV Piggyback:50] Out: 1415 [Urine:1415] Intake/Output this shift: Total I/O In: -  Out: 250 [Urine:250]  General appearance: alert, mild distress and uncomfortable. Resp: clear to auscultation bilaterally GI: significant distension, doesn't feel good, no flatus, no BM. dressing is clean and dry.  Lab Results:   Recent Labs  09/23/12 1600 09/24/12 0345  WBC 9.0 7.7  HGB 11.1* 10.6*  HCT 34.7* 33.7*  PLT 149* 170    BMET  Recent Labs  09/23/12 0415 09/24/12 0345  NA 139 138  K 3.5 4.3  CL 105 104  CO2 31 30  GLUCOSE 116* 156*  BUN 4* 5*  CREATININE 0.56 0.45*  CALCIUM 8.2* 8.3*   PT/INR  Recent Labs  09/23/12 0415  LABPROT 17.0*  INR 1.42     Recent Labs Lab 09/18/12 2335 09/19/12 0631 09/20/12 0355  AST 37 26 40*  ALT 17 13 16   ALKPHOS 106 83 74  BILITOT 0.3 0.3 0.4  PROT 7.3 6.6 6.6  ALBUMIN 2.3* 2.0* 2.1*     Lipase     Component Value Date/Time   LIPASE 80* 09/19/2012 0631     Studies/Results: No results found.  Medications: . elvitegravir-cobicistat-emtricitabine-tenofovir  1 tablet Oral Q breakfast  . fentaNYL  50 mcg Transdermal Q72H  . furosemide  20 mg Intravenous Once  . pantoprazole (PROTONIX) IV  40 mg Intravenous  Q24H  . sodium chloride  10-40 mL Intracatheter Q12H  . sulfamethoxazole-trimethoprim  1 tablet Oral Daily    Assessment/Plan obstructing carcinoma of the right colon. Status post laparoscopy, followed with open right hemicolectomy with anastomosis. 09/23/12, Dr. Glenna Fellows. Hepatitis C HIV on treatment Anemia Tobacco use   Plan:  2 view film, she may need an NG tube.    LOS: 7 days    Ruth Stephenson 09/25/2012

## 2012-09-25 NOTE — Progress Notes (Signed)
Patient ID: Ruth Stephenson, female   DOB: 1951/03/10, 62 y.o.   MRN: [id number]         [facility] for Infectious Disease    Date of Admission:  09/18/2012     Principal Problem:   Abdominal pain Active Problems:   Pancreatitis   Colonic mass - cecum   HIV (human immunodeficiency virus infection)   Hepatitis C   Tobacco abuse   Anemia   . elvitegravir-cobicistat-emtricitabine-tenofovir  1 tablet Oral Q breakfast  . fentaNYL  50 mcg Transdermal Q72H  . furosemide  20 mg Intravenous Once  . pantoprazole (PROTONIX) IV  40 mg Intravenous Q24H  . sodium chloride  10-40 mL Intracatheter Q12H  . sulfamethoxazole-trimethoprim  1 tablet Oral Daily    Subjective: She states that she is currently not having any more abdominal pain. She is very eager to stay on Stribild and get into care for her HIV infection in our clinic. She remains n.p.o. but has been able to take her oral Stribild and trimethoprim sulfamethoxazole.  Objective: Temp:  [97.4 F (36.3 C)-98.2 F (36.8 C)] 97.4 F (36.3 C) (04/02 0600) Pulse Rate:  [80-98] 87 (04/02 0600) Resp:  [20-24] 20 (04/02 0600) BP: (147-163)/(67-90) 147/67 mmHg (04/02 0600) SpO2:  [93 %-100 %] 97 % (04/02 0600) Weight:  [79 kg (174 lb 2.6 oz)] 79 kg (174 lb 2.6 oz) (04/02 0500)  General: She is groggy but answers questions appropriately Skin: No rash Abdomen: Distended  Lab Results Lab Results  Component Value Date   WBC 7.7 09/24/2012   HGB 10.6* 09/24/2012   HCT 33.7* 09/24/2012   MCV 81.8 09/24/2012   PLT 170 09/24/2012    Lab Results  Component Value Date   CREATININE 0.45* 09/24/2012   BUN 5* 09/24/2012   NA 138 09/24/2012   K 4.3 09/24/2012   CL 104 09/24/2012   CO2 30 09/24/2012    HIV 1 RNA Quant (copies/mL)  Date Value  09/19/2012 41785*     CD4 T Cell Abs (cmm)  Date Value  09/19/2012 150*   Assessment: Shortly after discharge she will be seen in our clinic to establish ongoing care for her HIV.  Plan: 1. Continue  Stribild and trimethoprim sulfamethoxazole  Cliffton Asters, MD [facility] for Infectious Disease [facility] [phone] pager   [phone] cell 09/25/2012, 2:30 PM

## 2012-09-25 NOTE — Progress Notes (Signed)
Patient interviewed and examined, agree with PA note above. Her abdomen is somewhat distended but not particularly tender. KUB shows a normal bowel gas pattern and no free air. I think she has an expected ileus and likely some reaccumulation of ascites but I do not see any evidence for significant complication. Continue n.p.o. For now.  Mariella Saa MD, FACS  09/25/2012 2:40 PM

## 2012-09-25 NOTE — Progress Notes (Signed)
[name] had her partial colectomy on Monday. She got through this okay. By the operative report, there is no obvious metastatic disease. She appears has a locally advanced disease by the size of the tumor. Hopefully, the pathology report will be out today.  Her preop CEA was 2.0.  She is sore. She must been transfused with the procedure. Her hemoglobin is a 10.7 today.  Still not able to eating anything. She wants to be able to get out of bed more.  Won't have to suspect that she will need adjuvant therapy. Again, etc. she has a locally advanced tumor. 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  Her prealbumin is only 3. This is a little bit troublesome to me. This may indicate some healing difficulties.  Her HIV titers are all quite a bit. ID is following this. She is on HIV meds.  We will provide further recommendations once we get the results back from the operative intervention.  I think Dr. Johna Sheriff for his quick intervention in being able to operate on her on Monday.  Hewitt Shorts  Psalm 147:3

## 2012-09-26 LAB — TYPE AND SCREEN
ABO/RH(D): A POS
Antibody Screen: NEGATIVE
Unit division: 0
Unit division: 0
Unit division: 0

## 2012-09-26 LAB — COMPREHENSIVE METABOLIC PANEL
ALT: 20 U/L (ref 0–35)
Alkaline Phosphatase: 80 U/L (ref 39–117)
Chloride: 102 mEq/L (ref 96–112)
GFR calc Af Amer: >90 mL/min (ref >90)
Glucose, Bld: 117 mg/dL — ABNORMAL HIGH (ref 70–99)
Potassium: 4.3 mEq/L (ref 3.5–5.1)
Total Bilirubin: 0.4 mg/dL (ref 0.3–1.2)
Total Protein: 6.5 g/dL (ref 6.0–8.3)

## 2012-09-26 LAB — CBC
MCH: 25.9 pg — ABNORMAL LOW (ref 26.0–34.0)
MCV: 84 fL (ref 78.0–100.0)
Platelets: 153 10*3/uL (ref 150–400)
RDW: 17.5 % — ABNORMAL HIGH (ref 11.5–15.5)

## 2012-09-26 LAB — HIV-1 GENOTYPR PLUS: Resistance Assoc RT Mutations: NOT DETECTED

## 2012-09-26 MED ORDER — FUROSEMIDE 10 MG/ML IJ SOLN
40.0000 mg | Freq: Every day | INTRAMUSCULAR | Status: DC
  Administered 2012-09-26 – 2012-09-27 (×2): 40 mg via INTRAVENOUS
  Filled 2012-09-26 (×2): qty 4

## 2012-09-26 NOTE — Progress Notes (Signed)
Patient ID: Ruth Stephenson, female   DOB: 06/12/1951, 62 y.o.   MRN: [id number]         [facility] for Infectious Disease    Date of Admission:  09/18/2012     . elvitegravir-cobicistat-emtricitabine-tenofovir  1 tablet Oral Q breakfast  . fentaNYL  50 mcg Transdermal Q72H  . furosemide  20 mg Intravenous Once  . furosemide  40 mg Intravenous Daily  . pantoprazole (PROTONIX) IV  40 mg Intravenous Q24H  . sodium chloride  10-40 mL Intracatheter Q12H  . sulfamethoxazole-trimethoprim  1 tablet Oral Daily    Subjective: She is feeling better overall but still not passing flatus. She for started antiretroviral therapy last year with a 3 drug regimen. She recalls that he was working in her viral load was undetectable but her regimen was simplified to once daily Stribild late last year and she remained undetectable before leaving [place] in January.  Objective: Temp:  [97.7 F (36.5 C)-98.2 F (36.8 C)] 98.2 F (36.8 C) (04/03 1413) Pulse Rate:  [70-94] 94 (04/03 1413) Resp:  [16-18] 16 (04/03 1413) BP: (109-132)/(62-83) 132/83 mmHg (04/03 1413) SpO2:  [92 %-96 %] 92 % (04/03 1413)  General: She is in good spirits Abdomen: Remains distended  Assessment: I will contact our financial counselor and social worker in clinic so they can begin working on financial assistance for her.  Plan: 1. Continue Stribild and trimethoprim sulfamethoxazole  Cliffton Asters, MD [facility] for Infectious Disease [facility] [phone] pager   [phone] cell 09/26/2012, 5:16 PM

## 2012-09-26 NOTE — Evaluation (Signed)
Physical Therapy Evaluation Patient Details Name: Ruth Stephenson MRN: [id number] DOB: [birth year] Today's Date: 09/26/2012 Time: 2536-6440 PT Time Calculation (min): 16 min  PT Assessment / Plan / Recommendation Clinical Impression  Pt. is 62 yo female admitted 09/18/12 with abdominal pain, found to have colon cancer, s/p hemicolectomy on 3/31. Pt. is quite mobile in room and halls. Pt. does have a flight of steps to her bed/bath. Will benefit from PT to improve endurance and safety to negotiate a flight of steps.     PT Assessment  Patient needs continued PT services    Follow Up Recommendations  No PT follow up    Does the patient have the potential to tolerate intense rehabilitation      Barriers to Discharge        Equipment Recommendations  None recommended by PT    Recommendations for Other Services     Frequency Min 3X/week    Precautions / Restrictions Precautions Precautions: None Precaution Comments: 042 Restrictions Weight Bearing Restrictions: No   Pertinent Vitals/Pain Uncomfortable in abdomen for leaning forward.      Mobility  Bed Mobility Details for Bed Mobility Assistance:  (pt already up; discussed sidelying to sit for comfort) Transfers Sit to Stand: 6: Modified independent (Device/Increase time) Ambulation/Gait Ambulation/Gait Assistance: 6: Modified independent (Device/Increase time) Ambulation Distance (Feet): 15 Feet Ambulation/Gait Assistance Details: pt observed up in room, does not hold onto IV pole. RN reports that pt is up in hallway. Gait Pattern: Step-through pattern    Exercises     PT Diagnosis: Acute pain;Generalized weakness  PT Problem List: Decreased strength;Decreased activity tolerance;Pain;Decreased safety awareness PT Treatment Interventions: Stair training;Patient/family education   PT Goals Acute Rehab PT Goals PT Goal Formulation: With patient Time For Goal Achievement: 10/10/12 Potential to Achieve Goals: Good Pt  will go Supine/Side to Sit: Independently PT Goal: Supine/Side to Sit - Progress: Goal set today Pt will Go Up / Down Stairs: Flight;with supervision;with rail(s) PT Goal: Up/Down Stairs - Progress: Goal set today  Visit Information  Assistance Needed: +1    Subjective Data  Subjective: I am up and about Patient Stated Goal: to go home   Prior Functioning  Home Living Lives With: Other (Comment) (granddaughter) Available Help at Discharge: Family Type of Home: Apartment Home Access: Stairs to enter [employer] of Steps: level entry and split level once you are inside Bathroom Shower/Tub: Engineer, manufacturing systems: Standard Prior Function Level of Independence: Independent Communication Communication: No difficulties    Cognition  Cognition Overall Cognitive Status: Appears within functional limits for tasks assessed/performed Behavior During Session: [facility] for tasks performed    Extremity/Trunk Assessment Right Upper Extremity Assessment RUE ROM/Strength/Tone: [facility] for tasks assessed Left Upper Extremity Assessment LUE ROM/Strength/Tone: [facility] for tasks assessed Right Lower Extremity Assessment RLE ROM/Strength/Tone: WFL for tasks assessed RLE Sensation: WFL - Light Touch Left Lower Extremity Assessment LLE ROM/Strength/Tone: WFL for tasks assessed LLE Sensation: WFL - Light Touch   Balance Balance Balance Assessed: Yes Dynamic Sitting Balance Dynamic Sitting - Balance Support: Right upper extremity supported Dynamic Sitting - Level of Assistance: 6: Modified independent (Device/Increase time)  End of Session PT - End of Session Activity Tolerance: Patient tolerated treatment well Patient left: in chair;with call bell/phone within reach;with family/visitor present  GP     Rada Hay 09/26/2012, 12:06 PM  Blanchard Kelch PT [phone]

## 2012-09-26 NOTE — Progress Notes (Signed)
PROGRESS NOTE  Ruth Stephenson:[id number] DOB: Feb 18, 1951 DOA: 09/18/2012 PCP: No primary provider on file.  Brief narrative: 62 yr old female with HIV, Hep C, TOb abuse, anemia admitted on 3.27.14 for abdominal pain-thoguht initally to be pancreatitis vs Asc colon mass.  GI DR. 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  Surgery was consulted and she underwent a Open R hemicolectomy 3.31.1 and was transfused 2 U PRBC and 2 U FFP She has had moderate to high pain needs and has been somewhat distended since surgery    Past medical history-As per Problem list Chart reviewed as below- Non in EPIC  Consultants:  Gen surgery  Oncology  GI  Procedures:  Colonoscopy 3.28  R hemicolectomy 3.31  Antibiotics:  None currently    Subjective  Alert, well.  Pain seems better controlled.  Tolerated Ice chips.  Starting on clear liquids. Feels swollen in LE's and legs and heavy   Objective    Interim History: none  Telemetry: NON-tele  Objective: Filed Vitals:   09/25/12 0600 09/25/12 1443 09/25/12 2200 09/26/12 0611  BP: 147/67 144/80 120/72 109/62  Pulse: 87 70 75 70  Temp: 97.4 F (36.3 C) 97.6 F (36.4 C) 97.7 F (36.5 C) 97.7 F (36.5 C)  TempSrc: Oral Oral Oral Oral  Resp: 20 18 18 18   Height:      Weight:      SpO2: 97% 94% 96% 96%    Intake/Output Summary (Last 24 hours) at 09/26/12 1317 Last data filed at 09/25/12 1457  Gross per 24 hour  Intake  152.5 ml  Output      0 ml  Net  152.5 ml    Exam:  General: alert pleasant AAF in NAd Cardiovascular: s1 s2 no m/r/g Respiratory: clear, no added sound Abdomen: distended ++, Sutures appear clean and intact.  BS decreased.  Non tympanitic   Data Reviewed: Basic Metabolic Panel:  Recent Labs Lab 09/20/12 0355 09/23/12 0415 09/24/12 0345 09/26/12 0315  NA 134* 139 138 136  K 3.7  3.5 4.3 4.3  CL 103 105 104 102  CO2 22 31 30  35*  GLUCOSE 81 116* 156* 117*  BUN 5* 4* 5* 8  CREATININE 0.52 0.56 0.45* 0.47*  CALCIUM 7.7* 8.2* 8.3* 8.6   Liver Function Tests:  Recent Labs Lab 09/20/12 0355 09/26/12 0315  AST 40* 40*  ALT 16 20  ALKPHOS 74 80  BILITOT 0.4 0.4  PROT 6.6 6.5  ALBUMIN 2.1* 2.0*   No results found for this basename: LIPASE, AMYLASE,  in the last 168 hours No results found for this basename: AMMONIA,  in the last 168 hours CBC:  Recent Labs Lab 09/20/12 0355 09/23/12 0415 09/23/12 1600 09/24/12 0345 09/26/12 0315  WBC 3.3* 3.1* 9.0 7.7 9.5  HGB 8.6* 8.4* 11.1* 10.6* 10.5*  HCT 27.7* 27.6* 34.7* 33.7* 34.1*  MCV 79.6 81.7 82.4 81.8 84.0  PLT 128* 151 149* 170 153   Cardiac Enzymes: No results found for this basename: CKTOTAL, CKMB, CKMBINDEX, TROPONINI,  in the last 168 hours BNP: No components found with this basename: POCBNP,  CBG: No results found for this basename: GLUCAP,  in the last 168 hours     Studies:              All Imaging reviewed and is as per above notation   Scheduled Meds: . elvitegravir-cobicistat-emtricitabine-tenofovir  1 tablet Oral Q breakfast  . fentaNYL  50 mcg Transdermal Q72H  . furosemide  20 mg Intravenous Once  . furosemide  40 mg Intravenous Daily  . pantoprazole (PROTONIX) IV  40 mg Intravenous Q24H  . sodium chloride  10-40 mL Intracatheter Q12H  . sulfamethoxazole-trimethoprim  1 tablet Oral Daily   Continuous Infusions:     Assessment/Plan: 1. Invasive Adenoca colon per Pathology report-Per Med oncology--CEA was 2.0 on 09/23/12-Per oncology.  Would consider in next 1-2 days discussion re: nutrition 2. LE swelling-likely 2/2 to low albumin.  Will need nutritional support as is NPO 3. Abd pain-2/2 to surgery-monitor.  Currently on Dilaudid 2-3 mg q1 prn.  Continue Fentanyl pacth 50 mcg-.  AXR done 4.2 shows normal bowel gas- expect distension will get better with increasing  mobility 4. HIV-CD 4 count 150, Viral Load 41,785-Per ID will continue Bactrim daily and Stribild.  ?No other prophylaxis needed 5. H/o Hep C-Will needs further discussion with [facility] on discharge.  LFT's stable but low pre-albumin 6. ABLA vs AoCD-transfused 2 u PRBc and FFP during surgery-Hb stable.  Threshold to transfuse would be below 7 7. Mod TCP-2/2 to HIv/Hep C and blood loss.  Monitor with am labs  Code Status: Full Family Communication:  none present Disposition Plan:  In-patient   Pleas Koch, MD  [facility] Pager [phone] 09/26/2012, 1:17 PM    LOS: 8 days

## 2012-09-26 NOTE — Progress Notes (Signed)
3 Days Post-Op  Subjective: Still distended but softer.  Says it hurts to cough, but she is walking allot in the halls now. NO flatus so far.  Objective: Vital signs in last 24 hours: Temp:  [97.6 F (36.4 C)-97.7 F (36.5 C)] 97.7 F (36.5 C) (04/03 0611) Pulse Rate:  [70-75] 70 (04/03 0611) Resp:  [18] 18 (04/03 0611) BP: (109-144)/(62-80) 109/62 mmHg (04/03 0611) SpO2:  [94 %-96 %] 96 % (04/03 0611) Last BM Date: 09/23/12  NPO, afebrile, BP is better, labs OK,  Intake/Output from previous day: 10-04-22 0701 - 04/03 0700 In: 651.3 [I.V.:651.3] Out: 250 [Urine:250] Intake/Output this shift:    General appearance: alert, cooperative and no distress Resp: clear to auscultation bilaterally GI: soft, still distended but not as tight as yesterday.  BS hypoactive.  Incisions all look good.  Lab Results:   Recent Labs  09/24/12 0345 09/26/12 0315  WBC 7.7 9.5  HGB 10.6* 10.5*  HCT 33.7* 34.1*  PLT 170 153    BMET  Recent Labs  09/24/12 0345 09/26/12 0315  NA 138 136  K 4.3 4.3  CL 104 102  CO2 30 35*  GLUCOSE 156* 117*  BUN 5* 8  CREATININE 0.45* 0.47*  CALCIUM 8.3* 8.6   PT/INR No results found for this basename: LABPROT, INR,  in the last 72 hours   Recent Labs Lab 09/20/12 0355 09/26/12 0315  AST 40* 40*  ALT 16 20  ALKPHOS 74 80  BILITOT 0.4 0.4  PROT 6.6 6.5  ALBUMIN 2.1* 2.0*     Lipase     Component Value Date/Time   LIPASE 80* 09/19/2012 0631     Studies/Results: Dg Abd 2 Views  10/03/12  *RADIOLOGY REPORT*  Clinical Data: Postop right colectomy for colon cancer  ABDOMEN - 2 VIEW  Comparison: CT 09/19/2012  Findings: Postop skin staples are present in the right abdomen. Normal bowel gas pattern.  Negative for ileus or obstruction.  No free air on the upright view.  IMPRESSION: Normal bowel gas pattern following right colectomy.   Original Report Authenticated By: Janeece Riggers, M.D.     Medications: .  elvitegravir-cobicistat-emtricitabine-tenofovir  1 tablet Oral Q breakfast  . fentaNYL  50 mcg Transdermal Q72H  . furosemide  20 mg Intravenous Once  . pantoprazole (PROTONIX) IV  40 mg Intravenous Q24H  . sodium chloride  10-40 mL Intracatheter Q12H  . sulfamethoxazole-trimethoprim  1 tablet Oral Daily    Assessment/Plan obstructing carcinoma of the right colon. Status post laparoscopy, followed with open right hemicolectomy with anastomosis. 09/23/12, Dr. Glenna Fellows.  Post Op ileus (K+4.3) Hepatitis C  HIV on treatment  Anemia  Tobacco use   Plan:  Will wait for her to open up. Start some ice chips.  LOS: 8 days    Rise Traeger 09/26/2012

## 2012-09-26 NOTE — Evaluation (Signed)
Occupational Therapy Evaluation Patient Details Name: Ruth Stephenson MRN: [id number] DOB: [birth year] Today's Date: 09/26/2012 Time: 1010-1026 OT Time Calculation (min): 16 min  OT Assessment / Plan / Recommendation Clinical Impression  This 62 year old female was admitted for abdominal pain and was found to have colon CA, s/p hemicolectomy.  All education was completed.  Pt does not need further OT at this time.  PT is following and will review bed mobility.      OT Assessment  Patient does not need any further OT services    Follow Up Recommendations  No OT follow up    Barriers to Discharge      Equipment Recommendations  None recommended by OT    Recommendations for Other Services    Frequency       Precautions / Restrictions Precautions Precautions: None Precaution Comments: 042 Restrictions Weight Bearing Restrictions: No   Pertinent Vitals/Pain Abdomen sore    ADL  Lower Body Bathing: Moderate assistance Where Assessed - Lower Body Bathing: Supported sit to stand Lower Body Dressing: Moderate assistance Where Assessed - Lower Body Dressing: Supported sit to Pharmacist, hospital: Modified independent Statistician Method: Sit to Barista:  (bed to chair) Tub/Shower Transfer: Min guard (simulated tub ledge with garbage can) Transfers/Ambulation Related to ADLs: pt moving around room--cues to attend to IV line.   ADL Comments: pt sore from surgery and cannot perform LB adls comfortably.  Provided reacher.  She doesn't wear socks and wears slip on shoes.  lives with granddaughter since she recently moved back to [facility]  cotx with PT    OT Diagnosis:    OT Problem List:   OT Treatment Interventions:     OT Goals    Visit Information  Last OT Received On: 09/26/12 Assistance Needed: +1    Subjective Data  Subjective: I don't wear socks Patient Stated Goal: get back to being independent.  Always on the move, cleaning etc.     Prior  Functioning     Home Living Lives With: Other (Comment) (granddaughter) Type of Home: Apartment Home Access: Stairs to enter [employer] of Steps: level entry and split level once you are inside Bathroom Shower/Tub: Engineer, manufacturing systems: Standard Prior Function Level of Independence: Independent Communication Communication: No difficulties         Vision/Perception     Cognition  Cognition Overall Cognitive Status: Appears within functional limits for tasks assessed/performed Behavior During Session: [facility] for tasks performed    Extremity/Trunk Assessment Right Upper Extremity Assessment RUE ROM/Strength/Tone: [facility] for tasks assessed Left Upper Extremity Assessment LUE ROM/Strength/Tone: [facility] for tasks assessed     Mobility Bed Mobility Details for Bed Mobility Assistance:  (pt already up; discussed sidelying to sit for comfort) Transfers Transfers: Sit to Stand Sit to Stand: 6: Modified independent (Device/Increase time)     Exercise     Balance     End of Session OT - End of Session Activity Tolerance: Patient tolerated treatment well Patient left: in chair;with call bell/phone within reach;with family/visitor present  GO     Ruth Stephenson 09/26/2012, 11:25 AM [name], OTR/L [phone] 09/26/2012

## 2012-09-26 NOTE — Progress Notes (Signed)
Still awaiting the pathology from her right hemicolectomy.  Not much new to do report. She is out of bed. I think she still n.p.o.  Her labs look okay. Hemoglobin 10.5. She did get iron over the weekend. She was transfused with her surgery. I agree with holding transfusions unless hemoglobin less than 7.  Her preop CEA was only 2.0. This hopefully is a decent indicator that she does not have advanced disease.  Once the pathology is back, hopefully today, then further recommendations can be made.  I did send a order to pathology for them to right a KRAS mutation assay on the biopsy from her colonoscopy.  Pete E.  Psalm 30:2

## 2012-09-27 MED ORDER — HYDROMORPHONE HCL PF 2 MG/ML IJ SOLN
2.0000 mg | INTRAMUSCULAR | Status: DC | PRN
  Administered 2012-09-27 – 2012-09-29 (×8): 2 mg via INTRAVENOUS
  Filled 2012-09-27 (×9): qty 1

## 2012-09-27 MED ORDER — SODIUM CHLORIDE 0.9 % IJ SOLN
10.0000 mL | INTRAMUSCULAR | Status: DC | PRN
  Administered 2012-09-30: 20 mL
  Administered 2012-10-01 – 2012-10-09 (×9): 10 mL

## 2012-09-27 MED ORDER — METOCLOPRAMIDE HCL 5 MG/ML IJ SOLN
5.0000 mg | Freq: Two times a day (BID) | INTRAMUSCULAR | Status: DC
  Administered 2012-09-27 – 2012-09-30 (×6): 5 mg via INTRAVENOUS
  Filled 2012-09-27 (×2): qty 1
  Filled 2012-09-27: qty 2
  Filled 2012-09-27 (×4): qty 1

## 2012-09-27 MED ORDER — FUROSEMIDE 10 MG/ML IJ SOLN
40.0000 mg | Freq: Two times a day (BID) | INTRAMUSCULAR | Status: DC
  Administered 2012-09-27 – 2012-10-01 (×8): 40 mg via INTRAVENOUS
  Filled 2012-09-27 (×10): qty 4

## 2012-09-27 MED ORDER — SODIUM CHLORIDE 0.9 % IJ SOLN
10.0000 mL | Freq: Two times a day (BID) | INTRAMUSCULAR | Status: DC
  Administered 2012-09-29 – 2012-10-09 (×4): 10 mL

## 2012-09-27 MED ORDER — ACETAMINOPHEN 10 MG/ML IV SOLN
1000.0000 mg | Freq: Four times a day (QID) | INTRAVENOUS | Status: AC
  Administered 2012-09-27 – 2012-09-28 (×4): 1000 mg via INTRAVENOUS
  Filled 2012-09-27 (×4): qty 100

## 2012-09-27 NOTE — Progress Notes (Signed)
NUTRITION FOLLOW UP  Intervention:   - Diet advancement per MD - Recommend MD monitor I/O as pt with 22 pound weight gain since admission (pt +1.4 L yesterday)  - Will continue to monitor  Nutrition Dx:   Increased nutrient needs related to HIV, hepatitis C, as evidenced by MD notes - ongoing   Goal:   1. Resolution of nausea/vomiting/diarrhea - met 2. Advance diet as tolerated to low sodium diet - not met  Monitor:   Weights, labs, diet advancement  Assessment:   POD # 4 open right hemicolectomy with anastomosis. 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 Pt remains on clear liquid diet. Met with pt who reports tolerating clear liquid diet and c/o being hungry. Pt's weight up 22 pounds since admission.   Height: Ht Readings from Last 1 Encounters:  09/19/12 5' 3.5" (1.613 m)    Weight Status:   Wt Readings from Last 1 Encounters:  09/25/12 174 lb 2.6 oz (79 kg)    Re-estimated needs:  Kcal: 1850-2100  Protein: 95-105  Fluid: 1.8-2.1L/day  Skin: +1 generalized edema  Diet Order: Clear Liquid   Intake/Output Summary (Last 24 hours) at 09/27/12 1036 Last data filed at 09/27/12 0543  Gross per 24 hour  Intake 1968.75 ml  Output    550 ml  Net 1418.75 ml    Last BM: 3/31   Labs:   Recent Labs Lab 09/23/12 0415 09/24/12 0345 09/26/12 0315  NA 139 138 136  K 3.5 4.3 4.3  CL 105 104 102  CO2 31 30 35*  BUN 4* 5* 8  CREATININE 0.56 0.45* 0.47*  CALCIUM 8.2* 8.3* 8.6  GLUCOSE 116* 156* 117*    CBG (last 3)  No results found for this basename: GLUCAP,  in the last 72 hours  Scheduled Meds: . elvitegravir-cobicistat-emtricitabine-tenofovir  1 tablet Oral Q breakfast  . fentaNYL  50 mcg Transdermal Q72H  . furosemide  20 mg Intravenous Once  . furosemide  40 mg Intravenous Daily  . pantoprazole (PROTONIX) IV  40 mg Intravenous Q24H  . sodium chloride  10-40 mL Intracatheter Q12H  . sulfamethoxazole-trimethoprim  1 tablet Oral Daily      Levon Hedger MS, RD, LDN [phone] Pager [phone] After Hours Pager

## 2012-09-27 NOTE — Progress Notes (Signed)
Patient interviewed and examined, agree with PA note above. No flatus or bowel movement yet. She wants to advance her diet. I think she still has some degree of ileus and will continue clears today.  Mariella Saa MD, FACS  09/27/2012 5:34 PM

## 2012-09-27 NOTE — Progress Notes (Signed)
4 Days Post-Op  Subjective: Up in chair, but having gas like pains, not much flatus, no nausea with clears.  Still distended some.    Objective: Vital signs in last 24 hours: Temp:  [98.2 F (36.8 C)-99.3 F (37.4 C)] 98.9 F (37.2 C) (04/04 0542) Pulse Rate:  [81-94] 81 (04/04 0542) Resp:  [16-18] 16 (04/04 0542) BP: (102-132)/(63-83) 102/63 mmHg (04/04 0542) SpO2:  [92 %-94 %] 93 % (04/04 0542) Last BM Date: 09/23/12  240 PO recorded, No BM recorded. Afebrile VSS,  Labs OK yesterday She is up 12 liters since admit.  Wt 152 on admit, now 174. She is on lasix. 20 mg morphine and fentanyl patch.   Intake/Output from previous day: 04/03 0701 - 04/04 0700 In: 1968.8 [P.O.:240; I.V.:1728.8] Out: 550 [Urine:550] Intake/Output this shift:    General appearance: alert, cooperative and mild distress Resp: clear to auscultation bilaterally GI: distended and very sore.  having pain while up in chair.  she does have bowel sounds.  Dressing is dry. Extremities: edema +1 edema both legs  Lab Results:   Recent Labs  09/26/12 0315  WBC 9.5  HGB 10.5*  HCT 34.1*  PLT 153    BMET  Recent Labs  09/26/12 0315  NA 136  K 4.3  CL 102  CO2 35*  GLUCOSE 117*  BUN 8  CREATININE 0.47*  CALCIUM 8.6   PT/INR No results found for this basename: LABPROT, INR,  in the last 72 hours   Recent Labs Lab 09/26/12 0315  AST 40*  ALT 20  ALKPHOS 80  BILITOT 0.4  PROT 6.5  ALBUMIN 2.0*     Lipase     Component Value Date/Time   LIPASE 80* 09/19/2012 0631     Studies/Results: Dg Abd 2 Views  10/18/2012  *RADIOLOGY REPORT*  Clinical Data: Postop right colectomy for colon cancer  ABDOMEN - 2 VIEW  Comparison: CT 09/19/2012  Findings: Postop skin staples are present in the right abdomen. Normal bowel gas pattern.  Negative for ileus or obstruction.  No free air on the upright view.  IMPRESSION: Normal bowel gas pattern following right colectomy.   Original Report Authenticated  By: Janeece Riggers, M.D.     Medications: . elvitegravir-cobicistat-emtricitabine-tenofovir  1 tablet Oral Q breakfast  . fentaNYL  50 mcg Transdermal Q72H  . furosemide  20 mg Intravenous Once  . furosemide  40 mg Intravenous Daily  . pantoprazole (PROTONIX) IV  40 mg Intravenous Q24H  . sodium chloride  10-40 mL Intracatheter Q12H  . sulfamethoxazole-trimethoprim  1 tablet Oral Daily    Assessment/Plan obstructing carcinoma of the right colon. Status post laparoscopy, followed with open right hemicolectomy with anastomosis. 09/23/12, Dr. Glenna Fellows.  Post op ileus Hepatitis C  HIV on treatment  Anemia  Tobacco use  PCM prealbumin is 3.0 on 09/23/12 with fluid overload.  Plan:  I am going to stop her fentanyl and we can start some IV tylenol.  I don't want to give her more PO nor more  narcotic.  She is already slow with a post op ileus.  I will defer fluid balance to Dr. Mahala Menghini.        LOS: 9 days    Horst Ostermiller 09/27/2012

## 2012-09-27 NOTE — Progress Notes (Addendum)
PROGRESS NOTE  Ruth Stephenson AVW:[id number] DOB: 03/06/1951 DOA: 09/18/2012 PCP: No primary provider on file.  Brief narrative: 62 yr old female with HIV, Hep C, TOb abuse, anemia admitted on 3.27.14 for abdominal pain-thoguht initally to be pancreatitis vs Asc colon mass.  GI DR. 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  Surgery was consulted and she underwent a Open R hemicolectomy 3.31.1 and was transfused 2 U PRBC and 2 U FFP She has had moderate to high pain needs and has been somewhat distended since surgery    Past medical history-As per Problem list Chart reviewed as below- Non in EPIC  Consultants:  Gen surgery  Oncology  GI  Procedures:  Colonoscopy 3.28  R hemicolectomy 3.31  Antibiotics:  None currently    Subjective  Alert, well. No flatus no stool. Still feels somewhat swollen lower strength is Abdomen seems a little softer but still distended. In retrospect and tolerating by mouth liquids   Objective    Interim History: none  Telemetry: NON-tele  Objective: Filed Vitals:   09/26/12 1413 09/26/12 2100 09/27/12 0542 09/27/12 1445  BP: 132/83 114/70 102/63 102/62  Pulse: 94 90 81 87  Temp: 98.2 F (36.8 C) 99.3 F (37.4 C) 98.9 F (37.2 C) 98.6 F (37 C)  TempSrc: Oral Oral Oral Oral  Resp: 16 18 16 16   Height:      Weight:      SpO2: 92% 94% 93% 94%    Intake/Output Summary (Last 24 hours) at 09/27/12 1730 Last data filed at 09/27/12 0816  Gross per 24 hour  Intake    480 ml  Output    550 ml  Net    -70 ml    Exam:  General: alert pleasant AAF in NAd Cardiovascular: s1 s2 no m/r/g Respiratory: clear, no added sound Abdomen: distended ++, Sutures appear clean and intact.  BS decreased.  Non tympanitic   Data Reviewed: Basic Metabolic Panel:  Recent Labs Lab 09/23/12 0415 09/24/12 0345 09/26/12 0315   NA 139 138 136  K 3.5 4.3 4.3  CL 105 104 102  CO2 31 30 35*  GLUCOSE 116* 156* 117*  BUN 4* 5* 8  CREATININE 0.56 0.45* 0.47*  CALCIUM 8.2* 8.3* 8.6   Liver Function Tests:  Recent Labs Lab 09/26/12 0315  AST 40*  ALT 20  ALKPHOS 80  BILITOT 0.4  PROT 6.5  ALBUMIN 2.0*   No results found for this basename: LIPASE, AMYLASE,  in the last 168 hours No results found for this basename: AMMONIA,  in the last 168 hours CBC:  Recent Labs Lab 09/23/12 0415 09/23/12 1600 09/24/12 0345 09/26/12 0315  WBC 3.1* 9.0 7.7 9.5  HGB 8.4* 11.1* 10.6* 10.5*  HCT 27.6* 34.7* 33.7* 34.1*  MCV 81.7 82.4 81.8 84.0  PLT 151 149* 170 153   Cardiac Enzymes: No results found for this basename: CKTOTAL, CKMB, CKMBINDEX, TROPONINI,  in the last 168 hours BNP: No components found with this basename: POCBNP,  CBG: No results found for this basename: GLUCAP,  in the last 168 hours     Studies:              All Imaging reviewed and is as per above notation   Scheduled Meds: . acetaminophen  1,000 mg Intravenous Q6H  . elvitegravir-cobicistat-emtricitabine-tenofovir  1 tablet Oral Q breakfast  . furosemide  20  mg Intravenous Once  . furosemide  40 mg Intravenous Daily  . pantoprazole (PROTONIX) IV  40 mg Intravenous Q24H  . sodium chloride  10-40 mL Intracatheter Q12H  . sulfamethoxazole-trimethoprim  1 tablet Oral Daily   Continuous Infusions:     Assessment/Plan: 1. Invasive Adenoca colon per Pathology report-Per Med oncology--CEA was 2.0 on 09/23/12-Per oncology.  Appreciate surgery input-will hold off on parenteral nutrition at this time-hopefully she will resolve from this ileus. Patient is on multiple pain medications which will be adjusted downwards-appreciate oncology input 2. LE swelling-likely 2/2 to low albumin.  Will need nutritional support as is NPO-start Lasix 40 mg 09/26/12-increased to BID, as is net 10 L positive this admission 3. Abd pain-2/2 to surgery-monitor.   Currently on Dilaudid 2-3 mg q1prn-Changed to q4prn to aid with bowel motility-Added reglan 5 bid 4.4.14--continue fentanyl patch 50 mcg-  AXR done 4.2 shows normal bowel gas- expect distension will get better with increasing mobility 4. HIV-CD 4 count 150, Viral Load 41,785-Per ID will continue Bactrim daily and Stribild.  Appreciate continued input 5. H/o Hep C-Will needs further discussion with [facility] on discharge.  LFT's stable but low pre-albumin 6. ABLA vs AoCD-transfused 2 u PRBc and FFP during surgery-Hb stable.  Threshold to transfuse would be below 7 7. Mod TCP-2/2 to HIv/Hep C and blood loss.    Code Status: Full Family Communication:  none present Disposition Plan:  In-patient-await return of bowel motility and potentially discharge soon   Pleas Koch, MD  [facility] Pager [phone] 09/27/2012, 5:30 PM    LOS: 9 days

## 2012-09-27 NOTE — Progress Notes (Signed)
Patient ID: Ruth Stephenson, female   DOB: 1951/05/30, 63 y.o.   MRN: [id number]           Current Discharge Medication List    CONTINUE these medications which have NOT CHANGED   Details  elvitegravir-cobicistat-emtricitabine-tenofovir (STRIBILD) 150-150-200-300 MG TABS Take 1 tablet by mouth daily with breakfast.        Subjective: She is having increased intermittent cramping "gas pains". She's not passing gas yet. She has no nausea or vomiting.  Objective: Temp: 98.9 F (37.2 C) (04/04 0542) Temp src: Oral (04/04 0542) BP: 102/63 mmHg (04/04 0542) Pulse Rate: 81 (04/04 0542)  General: Alert, relatively comfortable and talkative. Her daughter is with her. Abdomen: Distended, tympanitic with only mild diffuse tenderness  Lab Results HIV 1 RNA Quant (copies/mL)  Date Value  09/19/2012 41785*     CD4 T Cell Abs (cmm)  Date Value  09/19/2012 150*     Assessment: She is tolerating oral Stribild and trimethoprim sulfamethoxazole despite postoperative ileus. When she is discharged I will arrange followup in our clinic for ongoing care of her HIV infection.  Plan: 1. Continue current medications 2. Please call Dr. Enedina Finner [phone]) for any infectious disease questions this weekend.   Cliffton Asters, MD [facility] [phone] pager   [phone] cell 09/27/2012, 2:15 PM

## 2012-09-27 NOTE — Progress Notes (Signed)
The pathology report has come out. Surprisingly enough, it looks like she has stage II colon cancer. 30 lymph nodes were removed and all were negative. We did send off the specimen for MSI which could aid [place] in deciding if she needs any adjuvant therapy.   Otherwise, things are going okay. There's no lab work today. She is out of bed more.  We probably will followup with her as an outpatient.  I think the outcome here should be quite favorable for her.   Pete E.  Psalm 91:14-16

## 2012-09-27 NOTE — Progress Notes (Signed)
Patient interviewed and examined, agree with PA note above.  Erving Sassano T Donyetta Ogletree MD, FACS  09/27/2012 5:36 PM  

## 2012-09-27 NOTE — Progress Notes (Signed)
Physical Therapy Treatment Patient Details Name: Ruth Stephenson MRN: [id number] DOB: 1951/02/24 Today's Date: 09/27/2012 Time: 4098-1191 PT Time Calculation (min): 13 min  PT Assessment / Plan / Recommendation Comments on Treatment Session  Pt tolerated ambulation and stairs well with min guard and min VC's to curb her impulsivity and speed.    Follow Up Recommendations  No PT follow up     Equipment Recommendations  None recommended by PT    Frequency Min 3X/week   Plan Discharge plan remains appropriate    Precautions / Restrictions Precautions Precautions: None Precaution Comments: 042 Restrictions Weight Bearing Restrictions: No   Pertinent Vitals/Pain 7/10 abdominal pain during stair training    Mobility  Transfers Transfers: Sit to Stand;Stand to Sit Sit to Stand: 6: Modified independent (Device/Increase time) Stand to Sit: 6: Modified independent (Device/Increase time) Ambulation/Gait Ambulation/Gait Assistance: 4: Min guard Ambulation Distance (Feet): 100 Feet Assistive device: None Ambulation/Gait Assistance Details: Pt had good balance tho she was impulsive and needed min VC's to take her time. She did not hold onto the IV pole. Gait Pattern: Step-through pattern Gait velocity: Quick Stairs: Yes Stairs Assistance: 4: Min guard Stair Management Technique: Two rails;Forwards Number of Stairs: 22 Wheelchair Mobility Wheelchair Mobility: No      PT Goals Acute Rehab PT Goals PT Goal Formulation: With patient Time For Goal Achievement: 10/10/12 Potential to Achieve Goals: Good Pt will Go Up / Down Stairs: Flight;with supervision;with rail(s) PT Goal: Up/Down Stairs - Progress: Progressing toward goal  Visit Information  Last PT Received On: 09/27/12    End of Session PT - End of Session Equipment Utilized During Treatment: Gait belt Activity Tolerance: Patient tolerated treatment well Patient left: in chair;with call bell/phone within reach;with  family/visitor present   GP     BROWN-SMEDLEY, NICOLE 09/27/2012, 12:59 PM  Felecia Shelling  PTA [facility]  Acute  Rehab Pager      [phone]

## 2012-09-28 ENCOUNTER — Inpatient Hospital Stay (HOSPITAL_COMMUNITY): Payer: Medicaid - Out of State

## 2012-09-28 LAB — BASIC METABOLIC PANEL
Calcium: 8.2 mg/dL — ABNORMAL LOW (ref 8.4–10.5)
GFR calc non Af Amer: >90 mL/min (ref >90)
Glucose, Bld: 110 mg/dL — ABNORMAL HIGH (ref 70–99)
Potassium: 3.4 mEq/L — ABNORMAL LOW (ref 3.5–5.1)
Sodium: 131 mEq/L — ABNORMAL LOW (ref 135–145)

## 2012-09-28 LAB — CBC
HCT: 32.8 % — ABNORMAL LOW (ref 36.0–46.0)
MCHC: 31.7 g/dL (ref 30.0–36.0)
MCV: 83.9 fL (ref 78.0–100.0)
Platelets: 136 10*3/uL — ABNORMAL LOW (ref 150–400)
RDW: 19.5 % — ABNORMAL HIGH (ref 11.5–15.5)

## 2012-09-28 MED ORDER — BISACODYL 10 MG RE SUPP
10.0000 mg | Freq: Once | RECTAL | Status: AC
  Administered 2012-09-28: 10 mg via RECTAL
  Filled 2012-09-28: qty 1

## 2012-09-28 MED ORDER — ACETAMINOPHEN 325 MG PO TABS
650.0000 mg | ORAL_TABLET | Freq: Four times a day (QID) | ORAL | Status: DC | PRN
  Administered 2012-09-28 – 2012-10-01 (×3): 650 mg via ORAL
  Filled 2012-09-28 (×4): qty 2

## 2012-09-28 NOTE — Progress Notes (Signed)
5 Days Post-Op  Subjective: Up in chair, but having gas like pains, not much flatus, no nausea with clears.  Still distended.  She feels she is more distended this morning and is having more pain.  She is trying to hold off on her narcotics.    Objective: Vital signs in last 24 hours: Temp:  [98 F (36.7 C)-98.6 F (37 C)] 98 F (36.7 C) (04/05 0600) Pulse Rate:  [77-97] 77 (04/05 0600) Resp:  [16-20] 20 (04/05 0600) BP: (102-113)/(62-71) 107/62 mmHg (04/05 0600) SpO2:  [94 %-95 %] 94 % (04/05 0600) Weight:  [165 lb 14.4 oz (75.252 kg)] 165 lb 14.4 oz (75.252 kg) (04/05 0600) Last BM Date: 09/23/12  240 PO recorded, No BM recorded. Afebrile VSS,  Labs OK yesterday She is up 12 liters since admit.  Wt 152 on admit, now 174. She is on lasix. 20 mg morphine and fentanyl patch.   Intake/Output from previous day: 04/04 0701 - 04/05 0700 In: 480 [P.O.:480] Out: -  Intake/Output this shift:    General appearance: alert, cooperative and mild distress Resp: clear to auscultation bilaterally GI: distended and very sore.  having pain while up in chair.  she does have bowel sounds.  Dressing is dry. Extremities: edema +1 edema both legs  Lab Results:   Recent Labs  09/26/12 0315 09/28/12 0330  WBC 9.5 6.5  HGB 10.5* 10.4*  HCT 34.1* 32.8*  PLT 153 136*    BMET  Recent Labs  09/26/12 0315 09/28/12 0330  NA 136 131*  K 4.3 3.4*  CL 102 96  CO2 35* 32  GLUCOSE 117* 110*  BUN 8 6  CREATININE 0.47* 0.62  CALCIUM 8.6 8.2*   PT/INR No results found for this basename: LABPROT, INR,  in the last 72 hours   Recent Labs Lab 09/26/12 0315  AST 40*  ALT 20  ALKPHOS 80  BILITOT 0.4  PROT 6.5  ALBUMIN 2.0*     Lipase     Component Value Date/Time   LIPASE 80* 09/19/2012 0631     Studies/Results: No results found.  Medications: . bisacodyl  10 mg Rectal Once  . elvitegravir-cobicistat-emtricitabine-tenofovir  1 tablet Oral Q breakfast  . furosemide  20 mg  Intravenous Once  . furosemide  40 mg Intravenous BID  . metoCLOPramide (REGLAN) injection  5 mg Intravenous Q12H  . pantoprazole (PROTONIX) IV  40 mg Intravenous Q24H  . sodium chloride  10-40 mL Intracatheter Q12H  . sodium chloride  10-40 mL Intracatheter Q12H  . sulfamethoxazole-trimethoprim  1 tablet Oral Daily    Assessment/Plan obstructing carcinoma of the right colon. Status post laparoscopy, followed with open right hemicolectomy with anastomosis. 09/23/12, Dr. Glenna Fellows.  Post op ileus Hepatitis C  HIV on treatment  Anemia  Tobacco use  PCM prealbumin is 3.0 on 09/23/12 with fluid overload.  Plan: I will give a rectal suppository to help stimulate some bowel function.  I will get an AXR due to her increasing pain and distention.  I will defer fluid balance to Dr. Mahala Menghini.        LOS: 10 days    Ruth Jefferys C. 09/28/2012

## 2012-09-28 NOTE — Progress Notes (Signed)
PROGRESS NOTE  Ruth Stephenson:[id number] DOB: 1951-03-11 DOA: 09/18/2012 PCP: No primary provider on file.  Brief narrative: 62 yr old female with HIV, Hep C, TOb abuse, anemia admitted on 3.27.14 for abdominal pain-thoguht initally to be pancreatitis vs Asc colon mass.  GI DR. 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  Surgery was consulted and she underwent a Open R hemicolectomy 3.31.1 and was transfused 2 U PRBC and 2 U FFP She has had moderate to high pain needs and has been somewhat distended since surgery    Past medical history-As per Problem list Chart reviewed as below- Non in EPIC  Consultants:  Gen surgery  Oncology  GI  Procedures:  Colonoscopy 3.28  R hemicolectomy 3.31  Antibiotics:  None currently    Subjective  Feels better now-had AXR this am as was feeling more distended and was holding off on meds Enema given today with good result and small stool.  NO n/v Still distended somewhat LE's still swollen   Objective    Interim History: none  Telemetry: NON-tele  Objective: Filed Vitals:   09/27/12 1445 09/27/12 2200 09/28/12 0600 09/28/12 1313  BP: 102/62 113/71 107/62 119/74  Pulse: 87 97 77 79  Temp: 98.6 F (37 C) 98.3 F (36.8 C) 98 F (36.7 C) 97.9 F (36.6 C)  TempSrc: Oral Oral Oral Oral  Resp: 16 20 20 20   Height:      Weight:   75.252 kg (165 lb 14.4 oz)   SpO2: 94% 95% 94% 97%    Intake/Output Summary (Last 24 hours) at 09/28/12 1555 Last data filed at 09/28/12 1400  Gross per 24 hour  Intake   1500 ml  Output      0 ml  Net   1500 ml    Exam:  General: alert pleasant AAF in NAd Cardiovascular: s1 s2 no m/r/g Respiratory: clear, no added sound Abdomen: distended ++, Sutures appear clean and intact.  BS decreased.  Non tympanitic   Data Reviewed: Basic Metabolic Panel:  Recent Labs Lab  09/23/12 0415 09/24/12 0345 09/26/12 0315 09/28/12 0330  NA 139 138 136 131*  K 3.5 4.3 4.3 3.4*  CL 105 104 102 96  CO2 31 30 35* 32  GLUCOSE 116* 156* 117* 110*  BUN 4* 5* 8 6  CREATININE 0.56 0.45* 0.47* 0.62  CALCIUM 8.2* 8.3* 8.6 8.2*   Liver Function Tests:  Recent Labs Lab 09/26/12 0315  AST 40*  ALT 20  ALKPHOS 80  BILITOT 0.4  PROT 6.5  ALBUMIN 2.0*   No results found for this basename: LIPASE, AMYLASE,  in the last 168 hours No results found for this basename: AMMONIA,  in the last 168 hours CBC:  Recent Labs Lab 09/23/12 0415 09/23/12 1600 09/24/12 0345 09/26/12 0315 09/28/12 0330  WBC 3.1* 9.0 7.7 9.5 6.5  HGB 8.4* 11.1* 10.6* 10.5* 10.4*  HCT 27.6* 34.7* 33.7* 34.1* 32.8*  MCV 81.7 82.4 81.8 84.0 83.9  PLT 151 149* 170 153 136*   Cardiac Enzymes: No results found for this basename: CKTOTAL, CKMB, CKMBINDEX, TROPONINI,  in the last 168 hours BNP: No components found with this basename: POCBNP,  CBG: No results found for this basename: GLUCAP,  in the last 168 hours     Studies:              All Imaging reviewed and is  as per above notation   Scheduled Meds: . elvitegravir-cobicistat-emtricitabine-tenofovir  1 tablet Oral Q breakfast  . furosemide  20 mg Intravenous Once  . furosemide  40 mg Intravenous BID  . metoCLOPramide (REGLAN) injection  5 mg Intravenous Q12H  . pantoprazole (PROTONIX) IV  40 mg Intravenous Q24H  . sodium chloride  10-40 mL Intracatheter Q12H  . sodium chloride  10-40 mL Intracatheter Q12H  . sulfamethoxazole-trimethoprim  1 tablet Oral Daily   Continuous Infusions:     Assessment/Plan: 1. Invasive Adenoca colon per Pathology report-Per Med oncology--CEA was 2.0 on 09/23/12-Per oncology.  Appreciate surgery input-will hold off on parenteral nutrition at this time. Patient is on multiple pain medications which will be adjusted downwards-appreciate oncology input 2. Ileus-AXR 4.5 shows ileus-will keep on full  liquids.  Per surgery as to when to graduate diet. 3. LE swelling-likely 2/2 to low albumin.  Will need nutritional support as is NPO-start Lasix 40 mg 09/26/12-increased to BID, as is net 10 L positive this admission 4. Abd pain-2/2 to surgery-monitor.  Currently on Dilaudid 2-3 mg q1prn-Changed to q4prn to aid with bowel motility-Added reglan 5 bid 4.4.14--continue fentanyl patch 50 mcg-  AXR done 4.5 shows ileus- advised to chew gum as this might help peristalsis 5. Hyponatremia-monitor 6. HIV-CD 4 count 150, Viral Load 41,785-Per ID will continue Bactrim daily and Stribild.  Appreciate continued input 7. H/o Hep C-Will needs further discussion with [facility] on discharge.  LFT's stable but low pre-albumin 8. ABLA vs AoCD-transfused 2 u PRBc and FFP during surgery-Hb stable.  Threshold to transfuse would be below 7 9. Mod TCP-2/2 to HIv/Hep C and blood loss.    Code Status: Full Family Communication:  none present Disposition Plan:  In-patient-await return of bowel motility and potentially discharge soon   Pleas Koch, MD  [facility] Pager [phone] 09/28/2012, 3:55 PM    LOS: 10 days

## 2012-09-29 MED ORDER — HYDROMORPHONE HCL PF 2 MG/ML IJ SOLN
2.0000 mg | Freq: Four times a day (QID) | INTRAMUSCULAR | Status: DC | PRN
  Administered 2012-09-29 – 2012-09-30 (×4): 2 mg via INTRAVENOUS
  Filled 2012-09-29 (×3): qty 1

## 2012-09-29 MED ORDER — OXYCODONE-ACETAMINOPHEN 5-325 MG PO TABS
1.0000 | ORAL_TABLET | ORAL | Status: DC | PRN
  Administered 2012-09-29 – 2012-09-30 (×4): 2 via ORAL
  Filled 2012-09-29 (×4): qty 2

## 2012-09-29 MED ORDER — BISACODYL 10 MG RE SUPP
10.0000 mg | Freq: Once | RECTAL | Status: AC
  Administered 2012-09-29: 10 mg via RECTAL
  Filled 2012-09-29: qty 1

## 2012-09-29 NOTE — Progress Notes (Signed)
PROGRESS NOTE  Ruth Stephenson ZOX:[id number] DOB: September 18, 1950 DOA: 09/18/2012 PCP: No primary provider on file.  Brief narrative: 62 yr old female with HIV, Hep C, TOb abuse, anemia admitted on 3.27.14 for abdominal pain-thoguht initally to be pancreatitis vs Asc colon mass.  GI DR. 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  Surgery was consulted and she underwent a Open R hemicolectomy 3.31.1 and was transfused 2 U PRBC and 2 U FFP She has had moderate to high pain needs and has been somewhat distended since surgery and was graduated to a full liquid diet on 09/29/12.  Her pain meds have been adjusted and it is felt that the majoprity of her pain is from gaseous distention/ileus and not a blockage or other pathology  Past medical history-As per Problem list Chart reviewed as below- Non in EPIC  Consultants:  Gen surgery  Oncology  GI  Procedures:  Colonoscopy 3.28  R hemicolectomy 3.31  Antibiotics:  None currently    Subjective  Feels fair-really wishes to eat.  Explained to her Gen surgery would make be the one to determine Pain needs No CP SOB. No fever or chills Had enema this am with gas a dn a small amount of stool resulting from this   Objective    Interim History: none  Telemetry: NON-tele  Objective: Filed Vitals:   09/28/12 1313 09/28/12 2200 09/29/12 0600 09/29/12 1440  BP: 119/74 111/72 102/60 109/66  Pulse: 79 85 75 80  Temp: 97.9 F (36.6 C) 98.2 F (36.8 C) 98 F (36.7 C) 97.6 F (36.4 C)  TempSrc: Oral Oral Oral Oral  Resp: 20 20 18 18   Height:      Weight:    77.7 kg (171 lb 4.8 oz)  SpO2: 97% 95% 97% 97%    Intake/Output Summary (Last 24 hours) at 09/29/12 1529 Last data filed at 09/29/12 1000  Gross per 24 hour  Intake    240 ml  Output      1 ml  Net    239 ml    Exam:  General: alert pleasant AAF in  NAd Cardiovascular: s1 s2 no m/r/g Respiratory: clear, no added sound Abdomen: distended ++, Sutures appear clean and intact.  BS decreased.  Non tympanitic Edema in LE's decreasing   Data Reviewed: Basic Metabolic Panel:  Recent Labs Lab 09/23/12 0415 09/24/12 0345 09/26/12 0315 09/28/12 0330  NA 139 138 136 131*  K 3.5 4.3 4.3 3.4*  CL 105 104 102 96  CO2 31 30 35* 32  GLUCOSE 116* 156* 117* 110*  BUN 4* 5* 8 6  CREATININE 0.56 0.45* 0.47* 0.62  CALCIUM 8.2* 8.3* 8.6 8.2*   Liver Function Tests:  Recent Labs Lab 09/26/12 0315  AST 40*  ALT 20  ALKPHOS 80  BILITOT 0.4  PROT 6.5  ALBUMIN 2.0*   No results found for this basename: LIPASE, AMYLASE,  in the last 168 hours No results found for this basename: AMMONIA,  in the last 168 hours CBC:  Recent Labs Lab 09/23/12 0415 09/23/12 1600 09/24/12 0345 09/26/12 0315 09/28/12 0330  WBC 3.1* 9.0 7.7 9.5 6.5  HGB 8.4* 11.1* 10.6* 10.5* 10.4*  HCT 27.6* 34.7* 33.7* 34.1* 32.8*  MCV 81.7 82.4 81.8 84.0 83.9  PLT 151 149* 170 153 136*   Cardiac Enzymes: No results found for this basename: CKTOTAL, CKMB, CKMBINDEX, TROPONINI,  in  the last 168 hours BNP: No components found with this basename: POCBNP,  CBG: No results found for this basename: GLUCAP,  in the last 168 hours     Studies:              All Imaging reviewed and is as per above notation   Scheduled Meds: . elvitegravir-cobicistat-emtricitabine-tenofovir  1 tablet Oral Q breakfast  . furosemide  20 mg Intravenous Once  . furosemide  40 mg Intravenous BID  . metoCLOPramide (REGLAN) injection  5 mg Intravenous Q12H  . pantoprazole (PROTONIX) IV  40 mg Intravenous Q24H  . sodium chloride  10-40 mL Intracatheter Q12H  . sodium chloride  10-40 mL Intracatheter Q12H  . sulfamethoxazole-trimethoprim  1 tablet Oral Daily   Continuous Infusions:     Assessment/Plan: 1. Invasive Adenoca colon per Pathology report-Per Med oncology--CEA was 2.0 on  09/23/12-Per oncology.  Appreciate surgery input-will hold off on parenteral nutrition at this time. Patient is on multiple pain medications which will be adjusted downwards-Fentanyl patch D/c-Continue IV dilaudid as a 2nd choice for more severe pain-added percocet 1-2 q4 prn painappreciate oncology input 2. Ileus-AXR 4.5 shows ileus-will keep on full liquids.  Per surgery as to when to graduate diet. 3. LE swelling-likely 2/2 to low albumin/Volume given peri-operatively.  Will need nutritional support as is NPO-start Lasix 40 mg 09/26/12-increased to BID, as is net 10 L positive this admission 4. Abd pain-2/2 to surgery-monitor.  Currently on Dilaudid 2-3 mg q1prn-Changed to q4prn to aid with bowel motility-Added reglan 5 bid 4.4.14--continue fentanyl patch 50 mcg-  AXR done 4.5 shows ileus- advised to chew gum as this might help peristalsis 5. Hyponatremia-monitor-likely 2/2 to use of lasix.  Will need to d/c this if hyponatremia worsens 6. HIV-CD 4 count 150, Viral Load 41,785-Per ID will continue Bactrim daily and Stribild.  Appreciate continued input 7. H/o Hep C-Will needs further discussion with [facility] on discharge.  LFT's stable but low pre-albumin 8. ABLA vs AoCD-transfused 2 u PRBc and FFP during surgery-Hb stable.  Threshold to transfuse would be below 7 9. Mod TCP-2/2 to HIv/Hep C and blood loss.    Code Status: Full Family Communication:  none present Disposition Plan:  In-patient-await return of bowel motility and potentially discharge soon   Pleas Koch, MD  [facility] Pager [phone] 09/29/2012, 3:29 PM    LOS: 11 days

## 2012-09-29 NOTE — Progress Notes (Signed)
6 Days Post-Op  Subjective: Up in chair, feels better today.  Small BM with the suppository yesterday.  Tolerating fulls.  AXR yesterday looked ok with some air in loops of small bowel.  There appeared to be stool past her anastomosis but no air in her descending colon or rectum.  Objective: Vital signs in last 24 hours: Temp:  [97.9 F (36.6 C)-98.2 F (36.8 C)] 98 F (36.7 C) (04/06 0600) Pulse Rate:  [75-85] 75 (04/06 0600) Resp:  [18-20] 18 (04/06 0600) BP: (102-119)/(60-74) 102/60 mmHg (04/06 0600) SpO2:  [95 %-97 %] 97 % (04/06 0600) Last BM Date: 09/28/12    Intake/Output from previous day: 04/05 0701 - 04/06 0700 In: 1500 [I.V.:1500] Out: -  Intake/Output this shift: Total I/O In: 240 [P.O.:240] Out: -   General appearance: alert, cooperative Resp: clear to auscultation bilaterally GI: distended nontender.  having pain while up in chair.  she does have bowel sounds.   Inc: C/D/I Extremities: less edema noted  Lab Results:   Recent Labs  09/28/12 0330  WBC 6.5  HGB 10.4*  HCT 32.8*  PLT 136*    BMET  Recent Labs  09/28/12 0330  NA 131*  K 3.4*  CL 96  CO2 32  GLUCOSE 110*  BUN 6  CREATININE 0.62  CALCIUM 8.2*   PT/INR No results found for this basename: LABPROT, INR,  in the last 72 hours   Recent Labs Lab 09/26/12 0315  AST 40*  ALT 20  ALKPHOS 80  BILITOT 0.4  PROT 6.5  ALBUMIN 2.0*     Lipase     Component Value Date/Time   LIPASE 80* 09/19/2012 0631     Studies/Results: Dg Abd Portable 1v  09/28/2012  *RADIOLOGY REPORT*  Clinical Data: Abdominal distention  PORTABLE ABDOMEN - 1 VIEW  Comparison: Plain films 42,014  Findings: Skin staples in the right abdomen.  No pathologically dilated loops of large or small bowel.  There is gas within the colon and small bowel.  Small amount gas within the sigmoid colon.  IMPRESSION:   Findings suggestive of a postoperative ileus. Small amount gas in the sigmoid colon.   Original Report  Authenticated By: Genevive Bi, M.D.     Medications: . elvitegravir-cobicistat-emtricitabine-tenofovir  1 tablet Oral Q breakfast  . furosemide  20 mg Intravenous Once  . furosemide  40 mg Intravenous BID  . metoCLOPramide (REGLAN) injection  5 mg Intravenous Q12H  . pantoprazole (PROTONIX) IV  40 mg Intravenous Q24H  . sodium chloride  10-40 mL Intracatheter Q12H  . sodium chloride  10-40 mL Intracatheter Q12H  . sulfamethoxazole-trimethoprim  1 tablet Oral Daily    Assessment/Plan obstructing carcinoma of the right colon. Status post laparoscopy, followed with open right hemicolectomy with anastomosis. 09/23/12, Dr. Glenna Fellows.  Post op ileus- resolving Hepatitis C  HIV on treatment  Anemia  Tobacco use  PCM prealbumin is 3.0 on 09/23/12 with fluid overload.  Plan:  She is going to try another suppository today.  I think she could go to a low residue diet tomorrow if she continues to tolerate full liquids today.  Hopefully she can go home soon.         LOS: 11 days    Josephene Marrone C. 09/29/2012

## 2012-09-30 LAB — BASIC METABOLIC PANEL
CO2: 32 mEq/L (ref 19–32)
Chloride: 96 mEq/L (ref 96–112)
Glucose, Bld: 108 mg/dL — ABNORMAL HIGH (ref 70–99)
Potassium: 3.5 mEq/L (ref 3.5–5.1)
Sodium: 133 mEq/L — ABNORMAL LOW (ref 135–145)

## 2012-09-30 LAB — CBC
Hemoglobin: 10.2 g/dL — ABNORMAL LOW (ref 12.0–15.0)
MCH: 26.6 pg (ref 26.0–34.0)
Platelets: 152 10*3/uL (ref 150–400)
RBC: 3.84 MIL/uL — ABNORMAL LOW (ref 3.87–5.11)
WBC: 6.3 10*3/uL (ref 4.0–10.5)

## 2012-09-30 LAB — URINALYSIS, ROUTINE W REFLEX MICROSCOPIC
Nitrite: NEGATIVE
Specific Gravity, Urine: 1.023 (ref 1.005–1.030)
Urobilinogen, UA: 0.2 mg/dL (ref 0.0–1.0)
pH: 5.5 (ref 5.0–8.0)

## 2012-09-30 LAB — URINE MICROSCOPIC-ADD ON

## 2012-09-30 LAB — PROTIME-INR
INR: 1.22 (ref 0.00–1.49)
Prothrombin Time: 15.2 seconds (ref 11.6–15.2)

## 2012-09-30 MED ORDER — PANTOPRAZOLE SODIUM 40 MG IV SOLR
40.0000 mg | Freq: Every day | INTRAVENOUS | Status: DC
  Administered 2012-10-01 – 2012-10-02 (×2): 40 mg via INTRAVENOUS
  Filled 2012-09-30 (×3): qty 40

## 2012-09-30 MED ORDER — BISACODYL 10 MG RE SUPP
10.0000 mg | Freq: Every day | RECTAL | Status: DC | PRN
  Administered 2012-09-30 – 2012-10-09 (×4): 10 mg via RECTAL
  Filled 2012-09-30 (×6): qty 1

## 2012-09-30 MED ORDER — BISACODYL 10 MG RE SUPP: 10.0000 mg | Freq: Every day | RECTAL | Status: DC | PRN

## 2012-09-30 MED ORDER — FLUCONAZOLE 150 MG PO TABS
150.0000 mg | ORAL_TABLET | Freq: Once | ORAL | Status: AC
Start: 2012-09-30 — End: 2012-09-30
  Administered 2012-09-30: 150 mg via ORAL
  Filled 2012-09-30: qty 1

## 2012-09-30 MED ORDER — OXYCODONE-ACETAMINOPHEN 5-325 MG PO TABS
1.0000 | ORAL_TABLET | ORAL | Status: DC | PRN
  Administered 2012-10-01 (×2): 2 via ORAL
  Filled 2012-09-30 (×2): qty 2

## 2012-09-30 MED ORDER — METOCLOPRAMIDE HCL 5 MG PO TABS
5.0000 mg | ORAL_TABLET | Freq: Two times a day (BID) | ORAL | Status: DC
  Administered 2012-09-30 – 2012-10-09 (×18): 5 mg via ORAL
  Filled 2012-09-30 (×19): qty 1

## 2012-09-30 MED ORDER — OXYCODONE-ACETAMINOPHEN 5-325 MG PO TABS
1.0000 | ORAL_TABLET | Freq: Four times a day (QID) | ORAL | Status: DC | PRN
  Administered 2012-09-30 (×2): 2 via ORAL
  Filled 2012-09-30 (×2): qty 2

## 2012-09-30 NOTE — Progress Notes (Signed)
Patient seen and examined.  She is progressing well. 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  The significance/etiology of the upper abdominal lymph nodes is unclear-may be reactive rather than neoplastic. 30/30 mesenteric lymph nodes were negative.

## 2012-09-30 NOTE — Progress Notes (Signed)
Patient tearful at first as she talked about the "bad news" she had received today. Then she talked about her faith and needing to be "strong" for her children. Listening; presence. Prayed per her request. Will see if I can get her some reading glasses. Will continue to follow.

## 2012-09-30 NOTE — Progress Notes (Addendum)
PROGRESS NOTE  Ruth Stephenson:[id number] DOB: June 08, 1951 DOA: 09/18/2012 PCP: No primary provider on file.  Brief narrative: 62 yr old female with HIV, Hep C, TOb abuse, anemia admitted on 3.27.14 for abdominal pain-thoguht initally to be pancreatitis vs Asc colon mass.  GI DR. 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  Surgery was consulted and she underwent a Open R hemicolectomy 3.31.1 and was transfused 2 U PRBC and 2 U FFP She has had moderate to high pain needs and has been somewhat distended since surgery and was graduated to a full liquid diet on 09/29/12.  Her pain meds have been adjusted and it is felt that the majoprity of her pain is from gaseous distention/ileus and not a blockage or other pathology  Past medical history-As per Problem list Chart reviewed as below- Non in EPIC  Consultants:  Gen surgery  Oncology  GI  Procedures:  Colonoscopy 3.28  R hemicolectomy 3.31  Antibiotics:  None currently    Subjective  Sitting at bedside eating lunch. Tolerating diet well. Becomes emotional at discussion about findings of her pathology report Appreciate surgeon clarifying the same-patient understands clearly she has cancer that was resected. States her pain seems much better on oral medications.   Objective    Interim History: none  Telemetry: NON-tele  Objective: Filed Vitals:   09/29/12 0600 09/29/12 1440 09/29/12 2200 09/30/12 0620  BP: 102/60 109/66 127/76 104/71  Pulse: 75 80 92 81  Temp: 98 F (36.7 C) 97.6 F (36.4 C) 98.8 F (37.1 C) 98.3 F (36.8 C)  TempSrc: Oral Oral Oral Oral  Resp: 18 18 18 18   Height:      Weight:  77.7 kg (171 lb 4.8 oz)  76.2 kg (167 lb 15.9 oz)  SpO2: 97% 97% 96% 97%    Intake/Output Summary (Last 24 hours) at 09/30/12 1239 Last data filed at 09/29/12 2300  Gross per 24 hour  Intake      0 ml   Output      1 ml  Net     -1 ml    Exam:  General: alert pleasant AAF in NAd Cardiovascular: s1 s2 no m/r/g Respiratory: clear, no added sound Abdomen: distended ++, Sutures appear clean and intact.  BS decreased.  Non tympanitic still distended but less so. Edema in LE's decreasing-has TED hose on   Data Reviewed: Basic Metabolic Panel:  Recent Labs Lab 09/24/12 0345 09/26/12 0315 09/28/12 0330 09/30/12 0615  NA 138 136 131* 133*  K 4.3 4.3 3.4* 3.5  CL 104 102 96 96  CO2 30 35* 32 32  GLUCOSE 156* 117* 110* 108*  BUN 5* 8 6 5*  CREATININE 0.45* 0.47* 0.62 0.58  CALCIUM 8.3* 8.6 8.2* 8.2*   Liver Function Tests:  Recent Labs Lab 09/26/12 0315  AST 40*  ALT 20  ALKPHOS 80  BILITOT 0.4  PROT 6.5  ALBUMIN 2.0*   No results found for this basename: LIPASE, AMYLASE,  in the last 168 hours No results found for this basename: AMMONIA,  in the last 168 hours CBC:  Recent Labs Lab 09/23/12 1600 09/24/12 0345 09/26/12 0315 09/28/12 0330 09/30/12 0615  WBC 9.0 7.7 9.5 6.5 6.3  HGB 11.1* 10.6* 10.5* 10.4* 10.2*  HCT 34.7* 33.7* 34.1* 32.8* 32.3*  MCV 82.4 81.8 84.0 83.9 84.1  PLT 149* 170 153 136* 152   Cardiac  Enzymes: No results found for this basename: CKTOTAL, CKMB, CKMBINDEX, TROPONINI,  in the last 168 hours BNP: No components found with this basename: POCBNP,  CBG: No results found for this basename: GLUCAP,  in the last 168 hours     Studies:              All Imaging reviewed and is as per above notation   Scheduled Meds: . elvitegravir-cobicistat-emtricitabine-tenofovir  1 tablet Oral Q breakfast  . furosemide  20 mg Intravenous Once  . furosemide  40 mg Intravenous BID  . metoCLOPramide (REGLAN) injection  5 mg Intravenous Q12H  . [START ON 10/01/2012] pantoprazole (PROTONIX) IV  40 mg Intravenous QHS  . sodium chloride  10-40 mL Intracatheter Q12H  . sodium chloride  10-40 mL Intracatheter Q12H  . sulfamethoxazole-trimethoprim  1 tablet  Oral Daily   Continuous Infusions:     Assessment/Plan: 1. Stage 2A [T3N0Mx] Adenoca colon-Per Med oncology--CEA was 2.0 on 09/23/12-Per oncology.  Appreciate surgery input-full diet. Will wean opiates further 2. Ileus-AXR 4.5 shows ileus-now on full diet.  3. LE swelling-likely 2/2 to low albumin/Volume given peri-operatively.  -start Lasix 40 mg 09/26/12-increased to BID, as is net 10 L positive this admission-3 pounds lost since 4.1.14 4. Abd pain-2/2 to surgery-monitor.  Currently Dilaudid held -Added reglan 5 bid 4.4.14-- fentanyl patch 50 mc to come off 4.7-will not continue.  Seems to be resolving-Percocet 1-2 added q 4 prn 4.6.14 and changed to q 6 prn 4.7 5. Hyponatremia-monitor-likely 2/2 to use of lasix.  Seems stable 6. HIV-CD 4 count 150, Viral Load 41,785-Per ID will continue Bactrim daily and Stribild.  Appreciate continued input 7. H/o Hep C-Will needs further discussion with [facility] on discharge.  LFT's stable but low pre-albumin 8. ABLA vs AoCD-transfused 2 u PRBc and FFP during surgery-Hb stable.  Threshold to transfuse would be below 7 9. Mod TCP-2/2 to HIv/Hep C and blood loss.    Code Status: Full Family Communication:  none present Disposition Plan:  In-patient-await return of bowel motility and potentially discharge soon   Pleas Koch, MD  [facility] Pager [phone] 09/30/2012, 12:39 PM    LOS: 12 days

## 2012-09-30 NOTE — Progress Notes (Signed)
Patient ID: Ruth Stephenson, female   DOB: August 07, 1950, 62 y.o.   MRN: [id number]         [facility]    Date of Admission:  09/18/2012     Subjective: She says that she had 2 small bowel movements over the weekend. She says that she was surprised to learn today that she had cancer. She says she thought that she only had a "tumor".  Objective: Temp:  [98.3 F (36.8 C)-98.8 F (37.1 C)] 98.3 F (36.8 C) (04/07 0620) Pulse Rate:  [81-92] 81 (04/07 0620) Resp:  [18] 18 (04/07 0620) BP: (104-127)/(71-76) 104/71 mmHg (04/07 0620) SpO2:  [96 %-97 %] 97 % (04/07 0620) Weight:  [76.2 kg (167 lb 15.9 oz)] 76.2 kg (167 lb 15.9 oz) (04/07 0620)  General: Alert and comfortable Abdomen: Remains distended  HIV 1 RNA Quant (copies/mL)  Date Value  09/19/2012 41785*     CD4 T Cell Abs (cmm)  Date Value  09/19/2012 150*   Assessment: I will ask our bridge counselor from our clinic to assist her in helping arrange [facility]. I reminded her that she has had multiple discussions with me and with other providers about her colon cancer. I suspect there is some degree of denial and perhaps mild memory impairment related to her narcotic pain medications last week.  Plan: 1. Continue Stribild and trimethoprim sulfamethoxazole 2. Financial counseling  Cliffton Asters, MD [facility] [phone] pager   [phone] cell 09/30/2012, 2:58 PM

## 2012-09-30 NOTE — Progress Notes (Signed)
Clinical Social Work Department BRIEF PSYCHOSOCIAL ASSESSMENT 09/30/2012  Patient:  Ruth Stephenson, Ruth Stephenson     Account Number:  [id number]     Admit date:  09/18/2012  Clinical Social Worker:  Dennison Bulla  Date/Time:  09/30/2012 09:30 AM  Referred by:  Physician  Date Referred:  09/30/2012 Referred for  Other - See comment   Other Referral:   Patient had questions regarding Medicaid   Interview type:  Patient Other interview type:    PSYCHOSOCIAL DATA Living Status:  FAMILY Admitted from facility:   Level of care:   Primary support name:  Aram Beecham Primary support relationship to patient:  SIBLING Degree of support available:   Adequate    CURRENT CONCERNS Current Concerns  Other - See comment   Other Concerns:    SOCIAL WORK ASSESSMENT / PLAN CSW received referral due to patient having questions regarding Medicaid. CSW reviewed chart and met with patient at bedside.  Patient's sister at bedside but patient agreeable to sister involvement.    CSW introduced myself and explained role. Patient reports that she moved from [place] in January and plans to live in [place]. Patient is concerned about medications and Medicaid due to never applying for Medicaid in [place]. CSW spoke with financial counselor who informed CSW which Medicaid form patient would need to complete. CSW provided patient with application and information to local Department of Social Services office. Patient also reports that she was getting treatment for HIV. CSW provided patient with referral to [facility] to assist with transition to [place].    Patient reports no further concerns at this time. CSW is signing off but available if further needs arise.   Assessment/plan status:  Information/Referral to [facility] Other assessment/ plan:   Information/referral to community resources:   [facility]    PATIENT'S/FAMILY'S RESPONSE TO PLAN OF CARE: Patient alert  and oriented. Patient agreeable to assessment and appreciative of information. Patient agreeable to follow up with referrals after dc.

## 2012-09-30 NOTE — Progress Notes (Signed)
Patient ID: Ruth Stephenson, female   DOB: 12/03/1950, 62 y.o.   MRN: [id number] 7 Days Post-Op  Subjective: Walking around room, feels very hungry and really wants to eat regular food, +bm yesterday, denies n/v, pain controlled.  Objective: Vital signs in last 24 hours: Temp:  [97.6 F (36.4 C)-98.8 F (37.1 C)] 98.3 F (36.8 C) (04/07 0620) Pulse Rate:  [80-92] 81 (04/07 0620) Resp:  [18] 18 (04/07 0620) BP: (104-127)/(66-76) 104/71 mmHg (04/07 0620) SpO2:  [96 %-97 %] 97 % (04/07 0620) Weight:  [167 lb 15.9 oz (76.2 kg)-171 lb 4.8 oz (77.7 kg)] 167 lb 15.9 oz (76.2 kg) (04/07 0620) Last BM Date: 09/29/12    Intake/Output from previous day: 04/06 0701 - 04/07 0700 In: 240 [P.O.:240] Out: 2 [Urine:1; Stool:1] Intake/Output this shift:    General appearance: alert, cooperative Resp: CTA bilaterally GI: distended,  Min pain with palp, +BS Inc: C/D/I, staples in place  Lab Results:   Recent Labs  09/28/12 0330 09/30/12 0615  WBC 6.5 6.3  HGB 10.4* 10.2*  HCT 32.8* 32.3*  PLT 136* 152    BMET  Recent Labs  09/28/12 0330 09/30/12 0615  NA 131* 133*  K 3.4* 3.5  CL 96 96  CO2 32 32  GLUCOSE 110* 108*  BUN 6 5*  CREATININE 0.62 0.58  CALCIUM 8.2* 8.2*   PT/INR  Recent Labs  09/30/12 0615  LABPROT 15.2  INR 1.22     Recent Labs Lab 09/26/12 0315  AST 40*  ALT 20  ALKPHOS 80  BILITOT 0.4  PROT 6.5  ALBUMIN 2.0*     Lipase     Component Value Date/Time   LIPASE 80* 09/19/2012 0631     Studies/Results: Dg Abd Portable 1v  09/28/2012  *RADIOLOGY REPORT*  Clinical Data: Abdominal distention  PORTABLE ABDOMEN - 1 VIEW  Comparison: Plain films 42,014  Findings: Skin staples in the right abdomen.  No pathologically dilated loops of large or small bowel.  There is gas within the colon and small bowel.  Small amount gas within the sigmoid colon.  IMPRESSION:   Findings suggestive of a postoperative ileus. Small amount gas in the sigmoid colon.    Original Report Authenticated By: Genevive Bi, M.D.     Medications: . elvitegravir-cobicistat-emtricitabine-tenofovir  1 tablet Oral Q breakfast  . furosemide  20 mg Intravenous Once  . furosemide  40 mg Intravenous BID  . metoCLOPramide (REGLAN) injection  5 mg Intravenous Q12H  . [START ON 10/01/2012] pantoprazole (PROTONIX) IV  40 mg Intravenous QHS  . sodium chloride  10-40 mL Intracatheter Q12H  . sodium chloride  10-40 mL Intracatheter Q12H  . sulfamethoxazole-trimethoprim  1 tablet Oral Daily    Assessment/Plan obstructing carcinoma of the right colon. Status post laparoscopy, followed with open right hemicolectomy with anastomosis. 09/23/12, Dr. Glenna Fellows.  Post op ileus- resolving Hepatitis C  HIV on treatment  Anemia  Tobacco use  PCM prealbumin is 3.0 on 09/23/12 with fluid overload.  Plan:  Doing well overall, will give low res diet, prn dulcolax supp daily, if does well today then home tomorrow.         LOS: 12 days    Julyan Gales 09/30/2012

## 2012-10-01 ENCOUNTER — Inpatient Hospital Stay (HOSPITAL_COMMUNITY): Payer: Medicaid - Out of State

## 2012-10-01 MED ORDER — SPIRONOLACTONE 25 MG PO TABS
25.0000 mg | ORAL_TABLET | Freq: Every day | ORAL | Status: DC
Start: 2012-10-01 — End: 2012-10-07
  Administered 2012-10-01 – 2012-10-06 (×6): 25 mg via ORAL
  Filled 2012-10-01 (×7): qty 1

## 2012-10-01 MED ORDER — ALUM & MAG HYDROXIDE-SIMETH 200-200-20 MG/5ML PO SUSP
30.0000 mL | ORAL | Status: AC
  Administered 2012-10-01: 30 mL via ORAL
  Filled 2012-10-01: qty 30

## 2012-10-01 MED ORDER — ENSURE COMPLETE PO LIQD
237.0000 mL | Freq: Three times a day (TID) | ORAL | Status: DC
  Administered 2012-10-01 – 2012-10-09 (×16): 237 mL via ORAL

## 2012-10-01 MED ORDER — OXYCODONE HCL ER 10 MG PO T12A
10.0000 mg | EXTENDED_RELEASE_TABLET | Freq: Two times a day (BID) | ORAL | Status: DC
  Administered 2012-10-01 – 2012-10-06 (×12): 10 mg via ORAL
  Filled 2012-10-01 (×12): qty 1

## 2012-10-01 MED ORDER — FUROSEMIDE 10 MG/ML IJ SOLN
40.0000 mg | Freq: Three times a day (TID) | INTRAMUSCULAR | Status: DC
  Administered 2012-10-01 – 2012-10-04 (×9): 40 mg via INTRAVENOUS
  Filled 2012-10-01 (×13): qty 4

## 2012-10-01 MED ORDER — OXYCODONE HCL 5 MG PO TABS
5.0000 mg | ORAL_TABLET | ORAL | Status: DC | PRN
  Administered 2012-10-01 (×2): 10 mg via ORAL
  Filled 2012-10-01 (×2): qty 2

## 2012-10-01 MED ORDER — MORPHINE SULFATE 2 MG/ML IJ SOLN
1.0000 mg | INTRAMUSCULAR | Status: DC | PRN
  Administered 2012-10-01 – 2012-10-07 (×11): 1 mg via INTRAVENOUS
  Filled 2012-10-01 (×12): qty 1

## 2012-10-01 MED ORDER — HYDROMORPHONE HCL PF 1 MG/ML IJ SOLN
1.0000 mg | INTRAMUSCULAR | Status: DC | PRN
  Administered 2012-10-01 – 2012-10-03 (×19): 2 mg via INTRAVENOUS
  Administered 2012-10-04: 1 mg via INTRAVENOUS
  Administered 2012-10-04 (×3): 2 mg via INTRAVENOUS
  Filled 2012-10-01 (×10): qty 2
  Filled 2012-10-01: qty 1
  Filled 2012-10-01 (×12): qty 2

## 2012-10-01 MED ORDER — HYDROMORPHONE HCL PF 2 MG/ML IJ SOLN
INTRAMUSCULAR | Status: AC
  Administered 2012-10-01: 2 mg
  Filled 2012-10-01: qty 1

## 2012-10-01 MED ORDER — MORPHINE SULFATE 2 MG/ML IJ SOLN
1.0000 mg | INTRAMUSCULAR | Status: DC | PRN
  Administered 2012-10-01: 1 mg via INTRAVENOUS
  Filled 2012-10-01: qty 1

## 2012-10-01 MED ORDER — SENNA 8.6 MG PO TABS
1.0000 | ORAL_TABLET | Freq: Two times a day (BID) | ORAL | Status: DC
  Administered 2012-10-01 – 2012-10-09 (×15): 8.6 mg via ORAL
  Filled 2012-10-01 (×15): qty 1

## 2012-10-01 NOTE — Progress Notes (Signed)
Patients sister Andrey Cota will be staying with the patient from now until 8am

## 2012-10-01 NOTE — Progress Notes (Addendum)
Around 3pm, pt c/o severe chest pain and needing to go the bathroom shortly after giving PO pain meds. At time of giving pain meds, pt stated pain was a 9/10.  VSs showed increase BP. Did EKG, first print out showed abnormal EKG and then second showed normal EKG with muscle spasms.  Pt continued to c/o severe chest pain.  Called [facility] and paged MD.  MD looked at EKG. Ordered another EKG at 5pm.  He also ordered pain medication and to check troponin level.

## 2012-10-01 NOTE — Progress Notes (Addendum)
PROGRESS NOTE  Ruth Stephenson ZOX:[id number] DOB: Sep 28, 1950 DOA: 09/18/2012 PCP: No primary provider on file.  Brief narrative: 62 yr old female with HIV, Hep C, TOb abuse, anemia admitted on 3.27.14 for abdominal pain-thoguht initally to be pancreatitis vs Asc colon mass.  GI DR. 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  Surgery was consulted and she underwent a Open R hemicolectomy 3.31.1 and was transfused 2 U PRBC and 2 U FFP She has had moderate to high pain needs and has been somewhat distended since surgery and was graduated to a full liquid diet on 09/29/12.  Her pain meds have been adjusted and it is felt that the majoprity of her pain is from gaseous distention/ileus and not a blockage or other pathology  Past medical history-As per Problem list Chart reviewed as below- Non in EPIC  Consultants:  Gen surgery  Oncology  GI  Procedures:  Colonoscopy 3.28  R hemicolectomy 3.31  Antibiotics:  None currently    Subjective  Very emotional claims her pain is not controlled. Note that surgeon has increased her pain medications to oxycodone 5-50 mg every 4 when necessary Seems a little bit more distended. Patient becomes less tearful l the more talk with her and is able to sit up in bed without any significant pain when distracted. Past stool today  Objective    Interim History: none  Telemetry: NON-tele  Objective: Filed Vitals:   10/01/12 0500 10/01/12 0600 10/01/12 1440 10/01/12 1456  BP:  119/68 115/71 148/83  Pulse:  83 89 96  Temp:  98.8 F (37.1 C) 98.7 F (37.1 C)   TempSrc:  Oral Oral   Resp:  20 18 20   Height:      Weight: 72.757 kg (160 lb 6.4 oz)     SpO2:  97% 97% 97%    Intake/Output Summary (Last 24 hours) at 10/01/12 1458 Last data filed at 10/01/12 1440  Gross per 24 hour  Intake    600 ml  Output      0 ml  Net    600  ml    Exam:  General: alert pleasant AAF in NAd Cardiovascular: s1 s2 no m/r/g Respiratory: clear, no added sound Abdomen: distended +, Sutures appear clean and intact.  BS decreased.  Non tympanitic still distended but less so. Edema in LE's decreasing-has TED hose on   Data Reviewed: Basic Metabolic Panel:  Recent Labs Lab 09/26/12 0315 09/28/12 0330 09/30/12 0615  NA 136 131* 133*  K 4.3 3.4* 3.5  CL 102 96 96  CO2 35* 32 32  GLUCOSE 117* 110* 108*  BUN 8 6 5*  CREATININE 0.47* 0.62 0.58  CALCIUM 8.6 8.2* 8.2*   Liver Function Tests:  Recent Labs Lab 09/26/12 0315  AST 40*  ALT 20  ALKPHOS 80  BILITOT 0.4  PROT 6.5  ALBUMIN 2.0*   No results found for this basename: LIPASE, AMYLASE,  in the last 168 hours No results found for this basename: AMMONIA,  in the last 168 hours CBC:  Recent Labs Lab 09/26/12 0315 09/28/12 0330 09/30/12 0615  WBC 9.5 6.5 6.3  HGB 10.5* 10.4* 10.2*  HCT 34.1* 32.8* 32.3*  MCV 84.0 83.9 84.1  PLT 153 136* 152   Cardiac Enzymes: No results found for this basename: CKTOTAL, CKMB, CKMBINDEX, TROPONINI,  in the last 168 hours BNP: No components found with  this basename: POCBNP,  CBG: No results found for this basename: GLUCAP,  in the last 168 hours     Studies:              All Imaging reviewed and is as per above notation   Scheduled Meds: . elvitegravir-cobicistat-emtricitabine-tenofovir  1 tablet Oral Q breakfast  . feeding supplement  237 mL Oral TID BM  . furosemide  20 mg Intravenous Once  . furosemide  40 mg Intravenous TID  . metoCLOPramide  5 mg Oral Q12H  . OxyCODONE  10 mg Oral Q12H  . pantoprazole (PROTONIX) IV  40 mg Intravenous QHS  . sodium chloride  10-40 mL Intracatheter Q12H  . sodium chloride  10-40 mL Intracatheter Q12H  . sulfamethoxazole-trimethoprim  1 tablet Oral Daily   Continuous Infusions:     Assessment/Plan: 1. 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  Appreciate surgery input-full diet. Patient started back on OxyIR 5/15 every 4 when necessary, OxyContin 10 mg started by me 10/01/12. Have explained to him multiple times the cutback on pain medications. Have explained to her she needs to mobilize-when discharged would use low dosages and limited prescription of the same 2. Ileus-AXR 4.5 shows ileus-now on full diet-past regular stool today. 3. Anasarca/LE swelling-likely 2/2 to low albumin/Volume given peri-operatively.  -start Lasix 40 mg 09/26/12-increased to tid 10/01/12-added aldactone 25 mg as well 4.8 as is net 10 L positive this admission--3-4 pounds lost since 4.1.14 will repeat LFTs 10/01/12 given she has hepatitis C and also is on transcriptase inhibitors 4. Abd pain-2/2 to surgery-monitor.  See above discussion 5. Hyponatremia-monitor-likely 2/2 to use of lasix.  Seems stable 6. HIV-CD 4 count 150, Viral Load 41,785-Per ID will continue Bactrim daily and Stribild.  Appreciate continued input 7. H/o Hep C-Will needs further discussion with [facility] on discharge.  LFT's stable but low pre-albumin 8. ABLA vs AoCD-transfused 2 u PRBc and FFP during surgery-Hb stable.  Threshold to transfuse would be below 7 9. Mod TCP-2/2 to HIv/Hep C and blood loss.    Code Status: Full Family Communication:  none present Disposition Plan:  In-patient- likely able to discharge in 2-3 days provided fluid status pending balanced and her ascites resolves   Patient c/o severe abd/CP/and states ut is worse than labour pains.  She is inappropriate verbally and seems very labile, more so when she is in pain-I ordered a Troponin that was neg , Acute abdominal series which she refused (reordered for bedside) I suspect this is likely pain related to gas but if this doesn;t resolve, she may benefit form a Ct abdomen to rule out abcess or perforation, although her vitals and exam seem consistent with prior  Pleas Koch, MD  [facility] Pager [phone] 10/01/2012, 2:58 PM    LOS: 13 days

## 2012-10-01 NOTE — Progress Notes (Signed)
Patient seen and examined.  Has build up of ascites and is being treated with Lasix.

## 2012-10-01 NOTE — Progress Notes (Signed)
PT Cancellation Note  ___Treatment cancelled today due to medical issues with patient which prohibited therapy  ___ Treatment cancelled today due to patient receiving procedure or test   ___ Treatment cancelled today due to patient's refusal to participate   _X_ Treatment cancelled..... Arrived on unit in PM RN reported recent episode and asked if we could come back and check on her tomorrow morning.  Felecia Shelling  PTA [facility]  Acute  Rehab Pager      [phone]

## 2012-10-01 NOTE — Progress Notes (Signed)
Pt refuses to take 1600 lasix dose due to pain. MD aware.

## 2012-10-01 NOTE — Progress Notes (Signed)
8 Days Post-Op  Subjective: She feels better, taking PO's, she says her stool is loose and white.  Abdomen is still distended.  Objective: Vital signs in last 24 hours: Temp:  [98.8 F (37.1 C)] 98.8 F (37.1 C) (04/08 0600) Pulse Rate:  [83-87] 83 (04/08 0600) Resp:  [20] 20 (04/08 0600) BP: (111-119)/(64-68) 119/68 mmHg (04/08 0600) SpO2:  [93 %-97 %] 97 % (04/08 0600) Weight:  [160 lb 6.4 oz (72.757 kg)] 160 lb 6.4 oz (72.757 kg) (04/08 0500) Last BM Date: 09/30/12 Fat modified Diet, I/O=?, afebrile, VSS, labs OK Intake/Output from previous day:   Intake/Output this shift:    General appearance: alert, cooperative and no distress Resp: clear to auscultation bilaterally GI: still very distended, some drainage clear from lower right trocar site.  + BS, +BM Lower legs:  Still has lower leg edema.  Lab Results:   Recent Labs  09/30/12 0615  WBC 6.3  HGB 10.2*  HCT 32.3*  PLT 152    BMET  Recent Labs  09/30/12 0615  NA 133*  K 3.5  CL 96  CO2 32  GLUCOSE 108*  BUN 5*  CREATININE 0.58  CALCIUM 8.2*   PT/INR  Recent Labs  09/30/12 0615  LABPROT 15.2  INR 1.22     Recent Labs Lab 09/26/12 0315  AST 40*  ALT 20  ALKPHOS 80  BILITOT 0.4  PROT 6.5  ALBUMIN 2.0*     Lipase     Component Value Date/Time   LIPASE 80* 09/19/2012 0631     Studies/Results: No results found.  Medications: . elvitegravir-cobicistat-emtricitabine-tenofovir  1 tablet Oral Q breakfast  . furosemide  20 mg Intravenous Once  . furosemide  40 mg Intravenous BID  . metoCLOPramide  5 mg Oral Q12H  . pantoprazole (PROTONIX) IV  40 mg Intravenous QHS  . sodium chloride  10-40 mL Intracatheter Q12H  . sodium chloride  10-40 mL Intracatheter Q12H  . 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 - ADENOCARCINOMA EXTENDS  THROUGH THE MUSCULARIS PROPRIA. - THE SURGICAL RESECTION MARGINS ARE NEGATIVE FOR ADENOCARCINOMA. - BENIGN APPENDIX WITH FIBROUS OBLITERATION OF THE TIP. - THERE IS NO EVIDENCE OF CARCINOMA IN 30 OF 30 LYMPH NODES, (0/30). Assessment/Plan obstructing carcinoma of the right colon. Status post laparoscopy, followed with open right hemicolectomy with anastomosis. 09/23/12, Dr. Glenna Fellows.  Post op ileus- resolving  Hepatitis C  HIV on treatment  Anemia  Tobacco use  PCM prealbumin is 3.0 on 09/23/12 with fluid overload.  Hypokalemia resolved Post op anemia - stable   Plan:  She is 8 days out from her surgery.  Taking po's and having BM.  i think it's to early to take out her staples with her malnutrition/fluid overload, and I have called the office to get her in for staple removal next week.  I will put it in the AVS when I get it from the office.  Just continue local wound care with soap and water. Dressing Prn.  We will set up Oncology follow up after she sees Dr. Johna Sheriff in the office.     LOS: 13 days    Ruth Stephenson 10/01/2012

## 2012-10-01 NOTE — Progress Notes (Signed)
Patient ID: Ruth Stephenson, female   DOB: Feb 04, 1951, 62 y.o.   MRN: [id number]         [facility]    Date of Admission:  09/18/2012     Subjective: "I think they are going to kick me out of here tomorrow." She is feeling better.  Objective: Temp:  [98.8 F (37.1 C)] 98.8 F (37.1 C) (04/08 0600) Pulse Rate:  [83-87] 83 (04/08 0600) Resp:  [20] 20 (04/08 0600) BP: (111-119)/(64-68) 119/68 mmHg (04/08 0600) SpO2:  [93 %-97 %] 97 % (04/08 0600) Weight:  [72.757 kg (160 lb 6.4 oz)] 72.757 kg (160 lb 6.4 oz) (04/08 0500)  General: She appears more comfortable. She is up standing in her room Abdomen: Still distended   Assessment: She met with our financial counselors from clinic yesterday.  Plan: 1. I will arrange followup in our clinic shortly after discharge  Cliffton Asters, MD [facility] [phone] pager   [phone] cell 10/01/2012, 2:22 PM

## 2012-10-02 LAB — COMPREHENSIVE METABOLIC PANEL
Albumin: 1.9 g/dL — ABNORMAL LOW (ref 3.5–5.2)
Alkaline Phosphatase: 88 U/L (ref 39–117)
BUN: 8 mg/dL (ref 6–23)
CO2: 34 mEq/L — ABNORMAL HIGH (ref 19–32)
Chloride: 94 mEq/L — ABNORMAL LOW (ref 96–112)
Creatinine, Ser: 0.66 mg/dL (ref 0.50–1.10)
GFR calc non Af Amer: >90 mL/min (ref >90)
Glucose, Bld: 155 mg/dL — ABNORMAL HIGH (ref 70–99)
Potassium: 3.5 mEq/L (ref 3.5–5.1)
Total Bilirubin: 1 mg/dL (ref 0.3–1.2)

## 2012-10-02 LAB — MAGNESIUM: Magnesium: 1.6 mg/dL (ref 1.5–2.5)

## 2012-10-02 MED ORDER — POTASSIUM CHLORIDE CRYS ER 20 MEQ PO TBCR
40.0000 meq | EXTENDED_RELEASE_TABLET | Freq: Once | ORAL | Status: AC
  Administered 2012-10-02: 40 meq via ORAL
  Filled 2012-10-02: qty 2

## 2012-10-02 MED ORDER — FLEET ENEMA 7-19 GM/118ML RE ENEM
1.0000 | ENEMA | Freq: Once | RECTAL | Status: AC
  Administered 2012-10-02: 1 via RECTAL
  Filled 2012-10-02: qty 1

## 2012-10-02 MED ORDER — DOCUSATE SODIUM 100 MG PO CAPS
100.0000 mg | ORAL_CAPSULE | Freq: Two times a day (BID) | ORAL | Status: DC
  Administered 2012-10-02 – 2012-10-09 (×15): 100 mg via ORAL
  Filled 2012-10-02 (×16): qty 1

## 2012-10-02 MED ORDER — MAGNESIUM SULFATE 50 % IJ SOLN
3.0000 g | Freq: Once | INTRAVENOUS | Status: AC
  Administered 2012-10-02: 3 g via INTRAVENOUS
  Filled 2012-10-02: qty 6

## 2012-10-02 MED ORDER — POLYETHYLENE GLYCOL 3350 17 G PO PACK
17.0000 g | PACK | Freq: Two times a day (BID) | ORAL | Status: DC
  Administered 2012-10-02 – 2012-10-09 (×11): 17 g via ORAL
  Filled 2012-10-02 (×16): qty 1

## 2012-10-02 NOTE — Progress Notes (Signed)
[facility] PROGRESS NOTE  Ruth Stephenson ZOX:[id number] DOB: 1950/07/05 DOA: 09/18/2012 PCP: No primary provider on file.  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 Johna Sheriff. Postop ileus resolving. Patient stated had a small bowel movement this morning. Patient with less abdominal distention and less discomfort. Wound site with some draining ascites. Continue diuretics will fluid overload. Continue local wound care with soap and water. Dressing is needed. Per general surgery. Continue pain management. We'll need to followup with general surgery and oncology as outpatient.  #2 low-grade fever Stable. Per ID monitor off antibiotics.  #3 resolving ileus Patient with small bowel movement this morning. Patient given another enema.  #4 ascites/anasarca/lower extremity edema Likely secondary to low albumin state and perioperative fluids. Continue Aldactone and current IV Lasix. Monitor closely.  #5 HIV CD4 count of 150. Viral load [id number]. Continue Stribling and Bactrim daily. ID following and appreciate input and recommendations.  #6 history of hepatitis C  likely need to followup with ID as outpatient.  #7 acute blood loss anemia versus anemia of chronic disease Status post 2 units packed red blood cells and FFP perioperatively. Follow H&H. Transfusion threshold hemoglobin less than 7.  #8 hyponatremia Improvement. Follow.  Code Status: Full Family Communication: Updated patient no family present Disposition Plan: Home when medically stable versus SNF   Consultants:  Oncology: Dr. Myna Hidalgo 09/21/2012  General surgery: Dr. Abbey Chatters of 09/20/2012  Gastroenterology: Dr. Elnoria Howard 09/19/2012  Infectious diseases: Dr. Ninetta Lights 09/19/2012  Procedures:  Colonoscopy: 09/20/2012 per Dr. Elnoria Howard  Open right hemi-colectomy with anastomosis and  laparoscopy Dr. Johna Sheriff  09/23/2012  2 units packed red blood cells and 2 units FFP 09/23/2012  Antibiotics:  Bactrim 09/19/2012 for prophylaxis  HPI/Subjective: Patient states she's feeling better. Patient stated had a small bowel movement today. Patient states abdomen is less distended.  Objective: Filed Vitals:   10/01/12 1758 10/01/12 2030 10/02/12 0004 10/02/12 0633  BP:   116/70 121/72  Pulse:   102 102  Temp: 99.4 F (37.4 C) 100.9 F (38.3 C) 99.2 F (37.3 C) 98.8 F (37.1 C)  TempSrc: Oral Oral Oral Oral  Resp:   18 18  Height:      Weight:    75.5 kg (166 lb 7.2 oz)  SpO2:   93% 96%    Intake/Output Summary (Last 24 hours) at 10/02/12 1408 Last data filed at 10/01/12 1440  Gross per 24 hour  Intake    120 ml  Output      0 ml  Net    120 ml   Filed Weights   09/30/12 0620 10/01/12 0500 10/02/12 0633  Weight: 76.2 kg (167 lb 15.9 oz) 72.757 kg (160 lb 6.4 oz) 75.5 kg (166 lb 7.2 oz)    Exam:   General:  NAD  Cardiovascular: RRR  Respiratory: CTAB  Abdomen: Distended, positive bowel sounds, some tenderness to palpation diffusely , staples in place.  Extremities: No clubbing no cyanosis. 1-2+ bilateral lower extremity edema.  Data Reviewed: Basic Metabolic Panel:  Recent Labs Lab 09/26/12 0315 09/28/12 0330 09/30/12 0615 10/02/12 0450  NA 136 131* 133* 132*  K 4.3 3.4* 3.5 3.5  CL 102 96 96 94*  CO2 35* 32 32 34*  GLUCOSE 117* 110* 108* 155*  BUN 8 6 5* 8  CREATININE 0.47* 0.62 0.58 0.66  CALCIUM 8.6 8.2* 8.2* 8.3*  MG  --   --   --  1.6   Liver Function Tests:  Recent Labs Lab 09/26/12 0315 10/02/12 0450  AST 40* 40*  ALT 20 18  ALKPHOS 80 88  BILITOT 0.4 1.0  PROT 6.5 6.9  ALBUMIN 2.0* 1.9*   No results found for this basename: LIPASE, AMYLASE,  in the last 168 hours No results found for this basename: AMMONIA,  in the last 168 hours CBC:  Recent Labs Lab 09/26/12 0315 09/28/12 0330 09/30/12 0615  WBC 9.5 6.5 6.3   HGB 10.5* 10.4* 10.2*  HCT 34.1* 32.8* 32.3*  MCV 84.0 83.9 84.1  PLT 153 136* 152   Cardiac Enzymes:  Recent Labs Lab 10/01/12 1610  TROPONINI <0.30   BNP (last 3 results) No results found for this basename: PROBNP,  in the last 8760 hours CBG: No results found for this basename: GLUCAP,  in the last 168 hours  Recent Results (from the past 240 hour(s))  SURGICAL PCR SCREEN     Status: None   Collection Time    09/23/12  8:52 AM      Result Value Range Status   MRSA, PCR NEGATIVE  NEGATIVE Final   Staphylococcus aureus NEGATIVE  NEGATIVE Final   Comment:            The Xpert SA Assay (FDA     approved for NASAL specimens     in patients over 61 years of age),     is one component of     a comprehensive surveillance     program.  Test performance has     been validated by [employer] for patients greater     than or equal to 23 year old.     It is not intended     to diagnose infection nor to     guide or monitor treatment.  URINE CULTURE     Status: None   Collection Time    09/30/12  6:00 AM      Result Value Range Status   Specimen Description URINE, CLEAN CATCH   Final   Special Requests NONE   Final   Culture  Setup Time 09/30/2012 08:46   Final   Colony Count 80,000 COLONIES/ML   Final   Culture GRAM POSITIVE COCCI   Final   Report Status PENDING   Incomplete     Studies: Dg Abd Acute W/chest  10/01/2012  *RADIOLOGY REPORT*  Clinical Data: Diffuse abdominal pain  ACUTE ABDOMEN SERIES (ABDOMEN 2 VIEW & CHEST 1 VIEW)  Comparison: 09/28/2012, 04/02/2009  Findings: Artifact overlies the chest and abdomen presumably from a warming blanket.  Left PICC line tip projects at the SVC/RA junction.  Normal heart size and vascularity.  No definite focal pneumonia, edema, collapse, CHF, effusion or pneumothorax.  Slight rotation to the left.  Postop changes in the abdomen.  Retained stool throughout the bowel could represent a degree of constipation.  No free air on  the decubitus view.  No abnormal osseous finding.  IMPRESSION: Left PICC line tip SVC RA junction.  No definite acute chest or abdominal process  Possible constipation   Original Report Authenticated By: Judie Petit. Shick, M.D.     Scheduled Meds: . docusate sodium  100 mg Oral BID  . elvitegravir-cobicistat-emtricitabine-tenofovir  1 tablet Oral Q breakfast  . feeding supplement  237 mL Oral TID BM  . furosemide  20 mg Intravenous Once  . furosemide  40 mg Intravenous TID  . magnesium sulfate 1 - 4  g bolus IVPB  3 g Intravenous Once  . metoCLOPramide  5 mg Oral Q12H  . OxyCODONE  10 mg Oral Q12H  . pantoprazole (PROTONIX) IV  40 mg Intravenous QHS  . polyethylene glycol  17 g Oral BID  . senna  1 tablet Oral BID  . sodium chloride  10-40 mL Intracatheter Q12H  . sodium chloride  10-40 mL Intracatheter Q12H  . sodium phosphate  1 enema Rectal Once  . spironolactone  25 mg Oral Daily  . sulfamethoxazole-trimethoprim  1 tablet Oral Daily   Continuous Infusions:   Principal Problem:   Abdominal pain Active Problems:   Pancreatitis   Colonic mass - cecum   HIV (human immunodeficiency virus infection)   Hepatitis C   Tobacco abuse   Anemia    Time spent: > 35 mins    [facility]  [facility] Pager [phone]. If 7PM-7AM, please contact night-coverage at [email or web address], password [facility] 10/02/2012, 2:08 PM  LOS: 14 days

## 2012-10-02 NOTE — Progress Notes (Signed)
PT cancelled today per patient declination. Pt was ambulating around her room independently and has already completed stair training successfully. Will consult with PT to see if discharge from acute physical therapy is appropriate.

## 2012-10-02 NOTE — Progress Notes (Signed)
Patient seen and examined.  Had a fever of 100.9 at 8:30 last night.  Lateral aspect of RUQ wound more red.  I removed one staple and opened the lateral portion of the wound up-no purulent drainage.  Less distended but still leaking ascites from lower small incisions.  Needs better control of ascites before she can go home.

## 2012-10-02 NOTE — Progress Notes (Signed)
Patient ID: Ruth Stephenson, female   DOB: 19-Oct-1950, 62 y.o.   MRN: [id number]         [facility] for Infectious Disease    Date of Admission:  09/18/2012       . docusate sodium  100 mg Oral BID  . elvitegravir-cobicistat-emtricitabine-tenofovir  1 tablet Oral Q breakfast  . feeding supplement  237 mL Oral TID BM  . furosemide  20 mg Intravenous Once  . furosemide  40 mg Intravenous TID  . magnesium sulfate 1 - 4 g bolus IVPB  3 g Intravenous Once  . metoCLOPramide  5 mg Oral Q12H  . OxyCODONE  10 mg Oral Q12H  . pantoprazole (PROTONIX) IV  40 mg Intravenous QHS  . polyethylene glycol  17 g Oral BID  . senna  1 tablet Oral BID  . sodium chloride  10-40 mL Intracatheter Q12H  . sodium chloride  10-40 mL Intracatheter Q12H  . spironolactone  25 mg Oral Daily  . sulfamethoxazole-trimethoprim  1 tablet Oral Daily    Subjective: She had an episode of severe chest pain associated with some abdominal cramping yesterday. She feels little bit better today after having a large bowel movement after receiving an enema.  Objective: Temp:  [98.8 F (37.1 C)-100.9 F (38.3 C)] 99.2 F (37.3 C) (04/09 1450) Pulse Rate:  [95-102] 95 (04/09 1450) Resp:  [18] 18 (04/09 1450) BP: (109-135)/(70-75) 135/75 mmHg (04/09 1621) SpO2:  [93 %-96 %] 95 % (04/09 1450) Weight:  [75.5 kg (166 lb 7.2 oz)] 75.5 kg (166 lb 7.2 oz) (04/09 5621)  General: She appears uncomfortable, laying in bed Skin: Left arm PICC site appears normal Lungs: Clear Cor: Regular S1 and S2 no murmurs Abdomen: Soft, distended and nontender. She has a little bit of redness around her incisions. The lateral aspect of her right upper incision was opened today and a gauze dressing was applied  Microbiology: Recent Results (from the past 240 hour(s))  SURGICAL PCR SCREEN     Status: None   Collection Time    09/23/12  8:52 AM      Result Value Range Status   MRSA, PCR NEGATIVE  NEGATIVE Final   Staphylococcus aureus  NEGATIVE  NEGATIVE Final   Comment:            The Xpert SA Assay (FDA     approved for NASAL specimens     in patients over 35 years of age),     is one component of     a comprehensive surveillance     program.  Test performance has     been validated by [employer] for patients greater     than or equal to 53 year old.     It is not intended     to diagnose infection nor to     guide or monitor treatment.  URINE CULTURE     Status: None   Collection Time    09/30/12  6:00 AM      Result Value Range Status   Specimen Description URINE, CLEAN CATCH   Final   Special Requests NONE   Final   Culture  Setup Time 09/30/2012 08:46   Final   Colony Count 80,000 COLONIES/ML   Final   Culture GRAM POSITIVE COCCI   Final   Report Status PENDING   Incomplete   Assessment: She continues to have a very slow recovery from recent hemicolectomy for colon cancer.  She's had some low-grade fever last night. We'll continue careful observation off of antibiotics for now.  Plan: 1. Observe off of antibiotics for now and monitor temperature curve and wounds 2. Continue HIV regimen  Cliffton Asters, MD [facility] for Infectious Disease [facility] [phone] pager   [phone] cell 10/02/2012, 4:24 PM

## 2012-10-02 NOTE — Progress Notes (Signed)
Patient ID: Ruth Stephenson, female   DOB: [birth year], 62 y.o.   MRN: [id number] 9 Days Post-Op  Subjective: She feels better, feels less distended today, tolerating diet well  Objective: Vital signs in last 24 hours: Temp:  [98.7 F (37.1 C)-100.9 F (38.3 C)] 98.8 F (37.1 C) (04/09 4098) Pulse Rate:  [89-102] 102 (04/09 0633) Resp:  [18-20] 18 (04/09 1191) BP: (115-148)/(70-83) 121/72 mmHg (04/09 0633) SpO2:  [93 %-97 %] 96 % (04/09 4782) Weight:  [166 lb 7.2 oz (75.5 kg)] 166 lb 7.2 oz (75.5 kg) (04/09 9562) Last BM Date: 10/01/12 Fat modified Diet, I/O=?, afebrile, VSS, labs OK Intake/Output from previous day: 04/08 0701 - 04/09 0700 In: 600 [P.O.:600] Out: -  Intake/Output this shift:    General appearance: alert, cooperative and no distress Resp: clear to auscultation bilaterally GI: less distended, some drainage clear/yellowish from lower right trocar site.  + BS Lower legs:  Still has lower leg edema. Mild redness along the lateral edge of the superior incision Staples intact   Lab Results:   Recent Labs  09/30/12 0615  WBC 6.3  HGB 10.2*  HCT 32.3*  PLT 152    BMET  Recent Labs  09/30/12 0615 10/02/12 0450  NA 133* 132*  K 3.5 3.5  CL 96 94*  CO2 32 34*  GLUCOSE 108* 155*  BUN 5* 8  CREATININE 0.58 0.66  CALCIUM 8.2* 8.3*   PT/INR  Recent Labs  09/30/12 0615  LABPROT 15.2  INR 1.22     Recent Labs Lab 09/26/12 0315 10/02/12 0450  AST 40* 40*  ALT 20 18  ALKPHOS 80 88  BILITOT 0.4 1.0  PROT 6.5 6.9  ALBUMIN 2.0* 1.9*     Lipase     Component Value Date/Time   LIPASE 80* 09/19/2012 0631     Studies/Results: Dg Abd Acute W/chest  10/01/2012  *RADIOLOGY REPORT*  Clinical Data: Diffuse abdominal pain  ACUTE ABDOMEN SERIES (ABDOMEN 2 VIEW & CHEST 1 VIEW)  Comparison: 09/28/2012, 04/02/2009  Findings: Artifact overlies the chest and abdomen presumably from a warming blanket.  Left PICC line tip projects at the SVC/RA junction.   Normal heart size and vascularity.  No definite focal pneumonia, edema, collapse, CHF, effusion or pneumothorax.  Slight rotation to the left.  Postop changes in the abdomen.  Retained stool throughout the bowel could represent a degree of constipation.  No free air on the decubitus view.  No abnormal osseous finding.  IMPRESSION: Left PICC line tip SVC RA junction.  No definite acute chest or abdominal process  Possible constipation   Original Report Authenticated By: Judie Petit. Miles Costain, M.D.     Medications: . docusate sodium  100 mg Oral BID  . elvitegravir-cobicistat-emtricitabine-tenofovir  1 tablet Oral Q breakfast  . feeding supplement  237 mL Oral TID BM  . furosemide  20 mg Intravenous Once  . furosemide  40 mg Intravenous TID  . metoCLOPramide  5 mg Oral Q12H  . OxyCODONE  10 mg Oral Q12H  . pantoprazole (PROTONIX) IV  40 mg Intravenous QHS  . polyethylene glycol  17 g Oral BID  . potassium chloride  40 mEq Oral Once  . senna  1 tablet Oral BID  . sodium chloride  10-40 mL Intracatheter Q12H  . sodium chloride  10-40 mL Intracatheter Q12H  . sodium phosphate  1 enema Rectal Once  . spironolactone  25 mg Oral Daily  . sulfamethoxazole-trimethoprim  1 tablet Oral Daily   Diagnosis  Colon, segmental resection for tumor, right and Terminal ileum - INVASIVE ADENOCARCINOMA, MODERATELY DIFFERENTIATED, SPANNING 9.0 CM. - ADENOCARCINOMA EXTENDS THROUGH THE MUSCULARIS PROPRIA. - THE SURGICAL RESECTION MARGINS ARE NEGATIVE FOR ADENOCARCINOMA. - BENIGN APPENDIX WITH FIBROUS OBLITERATION OF THE TIP. - THERE IS NO EVIDENCE OF CARCINOMA IN 30 OF 30 LYMPH NODES, (0/30). Assessment/Plan obstructing carcinoma of the right colon. Status post laparoscopy, followed with open right hemicolectomy with anastomosis. 09/23/12, Dr. Glenna Fellows.  Post op ileus- resolving  Hepatitis C  HIV on treatment  Anemia  Tobacco use  PCM prealbumin is 3.0 on 09/23/12 with fluid overload.  Hypokalemia resolved Post  op anemia - stable   Plan:  She is 9 days out from her surgery.  Taking po's and having BM.  i think it's to early to take out her staples with her malnutrition/fluid overload, and I have called the office to get her in for staple removal next week.  I will put it in the AVS when I get it from the office.  Just continue local wound care with soap and water. Dressing Prn.  We will set up Oncology follow up after she sees Dr. Johna Sheriff in the office.     LOS: 14 days    Ameya Vowell 10/02/2012

## 2012-10-03 ENCOUNTER — Inpatient Hospital Stay (HOSPITAL_COMMUNITY): Payer: Medicaid - Out of State

## 2012-10-03 DIAGNOSIS — K567 Ileus, unspecified: Secondary | ICD-10-CM | POA: Diagnosis not present

## 2012-10-03 DIAGNOSIS — K5900 Constipation, unspecified: Secondary | ICD-10-CM | POA: Diagnosis present

## 2012-10-03 LAB — MAGNESIUM: Magnesium: 1.9 mg/dL (ref 1.5–2.5)

## 2012-10-03 LAB — COMPREHENSIVE METABOLIC PANEL
ALT: 18 U/L (ref 0–35)
AST: 35 U/L (ref 0–37)
Alkaline Phosphatase: 86 U/L (ref 39–117)
BUN: 9 mg/dL (ref 6–23)
Calcium: 8.5 mg/dL (ref 8.4–10.5)
GFR calc non Af Amer: >90 mL/min (ref >90)
Glucose, Bld: 128 mg/dL — ABNORMAL HIGH (ref 70–99)
Potassium: 3.9 mEq/L (ref 3.5–5.1)
Sodium: 132 mEq/L — ABNORMAL LOW (ref 135–145)
Total Bilirubin: 0.8 mg/dL (ref 0.3–1.2)

## 2012-10-03 LAB — CBC
Hemoglobin: 9.7 g/dL — ABNORMAL LOW (ref 12.0–15.0)
MCH: 26.9 pg (ref 26.0–34.0)
MCHC: 31.2 g/dL (ref 30.0–36.0)
RDW: 22.3 % — ABNORMAL HIGH (ref 11.5–15.5)

## 2012-10-03 LAB — LIPASE, BLOOD: Lipase: 219 U/L — ABNORMAL HIGH (ref 11–59)

## 2012-10-03 MED ORDER — SORBITOL 70 % SOLN
960.0000 mL | TOPICAL_OIL | Freq: Once | ORAL | Status: DC
  Filled 2012-10-03: qty 240

## 2012-10-03 MED ORDER — SIMETHICONE 80 MG PO CHEW
160.0000 mg | CHEWABLE_TABLET | Freq: Four times a day (QID) | ORAL | Status: DC | PRN
  Administered 2012-10-03 – 2012-10-06 (×2): 160 mg via ORAL
  Filled 2012-10-03 (×3): qty 2

## 2012-10-03 MED ORDER — PANTOPRAZOLE SODIUM 40 MG PO TBEC
40.0000 mg | DELAYED_RELEASE_TABLET | Freq: Every day | ORAL | Status: DC
  Administered 2012-10-03 – 2012-10-04 (×2): 40 mg via ORAL
  Filled 2012-10-03 (×2): qty 1

## 2012-10-03 MED ORDER — FLEET ENEMA 7-19 GM/118ML RE ENEM
1.0000 | ENEMA | Freq: Once | RECTAL | Status: AC
  Administered 2012-10-03: 1 via RECTAL
  Filled 2012-10-03: qty 1

## 2012-10-03 NOTE — Progress Notes (Signed)
Patient ID: Ruth Stephenson, female   DOB: March 25, 1951, 62 y.o.   MRN: [id number]         [facility] for Infectious Disease    Date of Admission:  09/18/2012     Subjective: She is complaining of severe abdominal pain stating it is worse today. She's not had a bowel movement today. Is not having any nausea or vomiting. She says that the pain gets worse when she tries to eat.  Objective: Temp:  [98.1 F (36.7 C)-98.8 F (37.1 C)] 98.1 F (36.7 C) (04/10 1342) Pulse Rate:  [85-97] 97 (04/10 1342) Resp:  [18] 18 (04/10 1342) BP: (104-135)/(63-81) 131/81 mmHg (04/10 1342) SpO2:  [95 %-97 %] 97 % (04/10 1342)  General: She is tearful Skin: No rash Lungs: Clear Cor: Regular S1 and S2 no murmurs Abdomen: Soft and distended. There is less redness around her incisions. Her right lateral incision remains open. The gauze dressing is clean and dry.  Lab Results Lab Results  Component Value Date   WBC 7.9 10/03/2012   HGB 9.7* 10/03/2012   HCT 31.1* 10/03/2012   MCV 86.1 10/03/2012   PLT 187 10/03/2012    Assessment: I wonder if pancreatitis is still contributing to her pain.  Plan: 1. Continue Stribild and trimethoprim sulfamethoxazole  Cliffton Asters, MD [facility] for Infectious Disease [facility] [phone] pager   [phone] cell 10/03/2012, 3:33 PM

## 2012-10-03 NOTE — Progress Notes (Signed)
[facility] PROGRESS NOTE  Ruth Stephenson ZOX:[id number] DOB: 04-07-1951 DOA: 09/18/2012 PCP: No primary provider on file.  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 Johna Sheriff. Postop ileus resolving. Patient stated had a small bowel movement yesterday. Patient with less abdominal distention but more lower abdominal pain. Wound site with some draining ascites. Continue diuretics will fluid overload. Continue local wound care with soap and water. Dressing as needed. Per general surgery. Continue pain management. Wi him it isn't like his doctor's visit ll need to followup with general surgery and oncology as outpatient.  #2 low-grade fever Stable. Afebrile x24 hours. Per ID monitor off antibiotics.  #3 resolving ileus/constipation Patient with small bowel movement yesterday. Patient given another enema today. Check abdominal films as patient complaining of abdominal pain. Small to enema..  #4 ascites/anasarca/lower extremity edema Likely secondary to low albumin state and perioperative fluids. Continue Aldactone and current IV Lasix. Monitor closely.  #5 HIV CD4 count of 150. Viral load [id number]. Continue Stribild and Bactrim daily. ID following and appreciate input and recommendations.  #6 history of hepatitis C  likely need to followup with ID as outpatient.  #7 acute blood loss anemia versus anemia of chronic disease Status post 2 units packed red blood cells and FFP perioperatively. Follow H&H. Transfusion threshold hemoglobin less than 7.  #8 hyponatremia Improvement. Follow.  Code Status: Full Family Communication: Updated patient no family present Disposition Plan: Home when medically stable versus SNF   Consultants:  Oncology: Dr. Myna Hidalgo 09/21/2012  General surgery: Dr. Abbey Chatters of 09/20/2012  Gastroenterology: Dr. Elnoria Howard  09/19/2012  Infectious diseases: Dr. Ninetta Lights 09/19/2012  Procedures:  Colonoscopy: 09/20/2012 per Dr. Elnoria Howard  Open right hemi-colectomy with anastomosis and laparoscopy Dr. Johna Sheriff  09/23/2012  2 units packed red blood cells and 2 units FFP 09/23/2012  Antibiotics:  Bactrim 09/19/2012 for prophylaxis  HPI/Subjective: Patient states she's feeling better. Patient stated had a small bowel movement yesterday. Patient states abdomen is less distended. Patient complaining of worsening low abnormal pain.  Objective: Filed Vitals:   10/02/12 1450 10/02/12 1621 10/02/12 2222 10/03/12 0500  BP: 109/70 135/75 119/78 104/63  Pulse: 95  90 85  Temp: 99.2 F (37.3 C)  98.4 F (36.9 C) 98.8 F (37.1 C)  TempSrc: Oral  Oral Oral  Resp: 18  18 18   Height:      Weight:      SpO2: 95%  95% 95%    Intake/Output Summary (Last 24 hours) at 10/03/12 1141 Last data filed at 10/02/12 2056  Gross per 24 hour  Intake      0 ml  Output      0 ml  Net      0 ml   Filed Weights   09/30/12 0620 10/01/12 0500 10/02/12 0633  Weight: 76.2 kg (167 lb 15.9 oz) 72.757 kg (160 lb 6.4 oz) 75.5 kg (166 lb 7.2 oz)    Exam:   General:  NAD  Cardiovascular: RRR  Respiratory: CTAB  Abdomen: Distended, positive bowel sounds, some tenderness to palpation diffusely more in the lower quadrants, staples in place.  Extremities: No clubbing no cyanosis. 1-2+ bilateral lower extremity edema.  Data Reviewed: Basic Metabolic Panel:  Recent Labs Lab 09/28/12 0330 09/30/12 0615 10/02/12 0450 10/03/12 0605  NA 131* 133* 132* 132*  K 3.4* 3.5 3.5 3.9  CL 96 96 94* 93*  CO2 32 32 34* 35*  GLUCOSE  110* 108* 155* 128*  BUN 6 5* 8 9  CREATININE 0.62 0.58 0.66 0.73  CALCIUM 8.2* 8.2* 8.3* 8.5  MG  --   --  1.6 1.9   Liver Function Tests:  Recent Labs Lab 10/02/12 0450 10/03/12 0605  AST 40* 35  ALT 18 18  ALKPHOS 88 86  BILITOT 1.0 0.8  PROT 6.9 7.3  ALBUMIN 1.9* 1.9*   No results found  for this basename: LIPASE, AMYLASE,  in the last 168 hours No results found for this basename: AMMONIA,  in the last 168 hours CBC:  Recent Labs Lab 09/28/12 0330 09/30/12 0615 10/03/12 0605  WBC 6.5 6.3 7.9  HGB 10.4* 10.2* 9.7*  HCT 32.8* 32.3* 31.1*  MCV 83.9 84.1 86.1  PLT 136* 152 187   Cardiac Enzymes:  Recent Labs Lab 10/01/12 1610  TROPONINI <0.30   BNP (last 3 results) No results found for this basename: PROBNP,  in the last 8760 hours CBG: No results found for this basename: GLUCAP,  in the last 168 hours  Recent Results (from the past 240 hour(s))  URINE CULTURE     Status: None   Collection Time    09/30/12  6:00 AM      Result Value Range Status   Specimen Description URINE, CLEAN CATCH   Final   Special Requests NONE   Final   Culture  Setup Time 09/30/2012 08:46   Final   Colony Count 80,000 COLONIES/ML   Final   Culture GRAM POSITIVE COCCI   Final   Report Status PENDING   Incomplete     Studies: Dg Abd Acute W/chest  10/01/2012  *RADIOLOGY REPORT*  Clinical Data: Diffuse abdominal pain  ACUTE ABDOMEN SERIES (ABDOMEN 2 VIEW & CHEST 1 VIEW)  Comparison: 09/28/2012, 04/02/2009  Findings: Artifact overlies the chest and abdomen presumably from a warming blanket.  Left PICC line tip projects at the SVC/RA junction.  Normal heart size and vascularity.  No definite focal pneumonia, edema, collapse, CHF, effusion or pneumothorax.  Slight rotation to the left.  Postop changes in the abdomen.  Retained stool throughout the bowel could represent a degree of constipation.  No free air on the decubitus view.  No abnormal osseous finding.  IMPRESSION: Left PICC line tip SVC RA junction.  No definite acute chest or abdominal process  Possible constipation   Original Report Authenticated By: Judie Petit. Shick, M.D.     Scheduled Meds: . docusate sodium  100 mg Oral BID  . elvitegravir-cobicistat-emtricitabine-tenofovir  1 tablet Oral Q breakfast  . feeding supplement  237 mL  Oral TID BM  . furosemide  20 mg Intravenous Once  . furosemide  40 mg Intravenous TID  . metoCLOPramide  5 mg Oral Q12H  . OxyCODONE  10 mg Oral Q12H  . pantoprazole  40 mg Oral Daily  . polyethylene glycol  17 g Oral BID  . senna  1 tablet Oral BID  . sodium chloride  10-40 mL Intracatheter Q12H  . sodium chloride  10-40 mL Intracatheter Q12H  . spironolactone  25 mg Oral Daily  . sulfamethoxazole-trimethoprim  1 tablet Oral Daily   Continuous Infusions:   Principal Problem:   Abdominal pain Active Problems:   Pancreatitis   Colonic mass - cecum   HIV (human immunodeficiency virus infection)   Hepatitis C   Tobacco abuse   Anemia    Time spent: > 35 mins    [facility]  [facility] Pager [phone]. If 7PM-7AM, please  contact night-coverage at [email or web address], password [facility] 10/03/2012, 11:41 AM  LOS: 15 days

## 2012-10-03 NOTE — Progress Notes (Signed)
PHARMACY - BRIEF NOTE  This patient is receiving IV Protonix. Based on criteria approved by the Pharmacy and Therapeutics Committee, this medication is being converted to the equivalent oral dose form. These criteria include:   . The patient is eating (either orally or per tube) and/or has been taking other orally administered medications for at least 24 hours.  . This patient has no evidence of active gastrointestinal bleeding or impaired GI absorption (gastrectomy, short bowel, patient on TNA or NPO).   If you have questions about this conversion, please contact the pharmacy department [phone])  Dannielle Huh, [facility] 10/03/2012 8:51 AM

## 2012-10-03 NOTE — Progress Notes (Signed)
Patient ID: Ruth Stephenson, female   DOB: 1950/11/20, 62 y.o.   MRN: [id number] 10 Days Post-Op  Subjective: She feels better, feels less distended today, tolerating diet well, had large BM  Objective: Vital signs in last 24 hours: Temp:  [98.4 F (36.9 C)-99.2 F (37.3 C)] 98.8 F (37.1 C) (04/10 0500) Pulse Rate:  [85-95] 85 (04/10 0500) Resp:  [18] 18 (04/10 0500) BP: (104-135)/(63-78) 104/63 mmHg (04/10 0500) SpO2:  [95 %] 95 % (04/10 0500) Last BM Date: 10/02/12 Fat modified Diet, I/O=?, afebrile, VSS, labs OK Intake/Output from previous day:   Intake/Output this shift:    General appearance: alert, cooperative and no distress Resp: clear to auscultation bilaterally GI: less distended, some drainage clear/yellowish from lower both trocar sites, lateral edge of incision is less red today and the wound where staple was removed is healhty  + BS Lower legs:  Still has lower leg edema. Staples intact   Lab Results:   Recent Labs  10/03/12 0605  WBC 7.9  HGB 9.7*  HCT 31.1*  PLT 187    BMET  Recent Labs  10/02/12 0450 10/03/12 0605  NA 132* 132*  K 3.5 3.9  CL 94* 93*  CO2 34* 35*  GLUCOSE 155* 128*  BUN 8 9  CREATININE 0.66 0.73  CALCIUM 8.3* 8.5   PT/INR No results found for this basename: LABPROT, INR,  in the last 72 hours   Recent Labs Lab 10/02/12 0450 10/03/12 0605  AST 40* 35  ALT 18 18  ALKPHOS 88 86  BILITOT 1.0 0.8  PROT 6.9 7.3  ALBUMIN 1.9* 1.9*     Lipase     Component Value Date/Time   LIPASE 80* 09/19/2012 0631     Studies/Results: Dg Abd Acute W/chest  10/01/2012  *RADIOLOGY REPORT*  Clinical Data: Diffuse abdominal pain  ACUTE ABDOMEN SERIES (ABDOMEN 2 VIEW & CHEST 1 VIEW)  Comparison: 09/28/2012, 04/02/2009  Findings: Artifact overlies the chest and abdomen presumably from a warming blanket.  Left PICC line tip projects at the SVC/RA junction.  Normal heart size and vascularity.  No definite focal pneumonia, edema,  collapse, CHF, effusion or pneumothorax.  Slight rotation to the left.  Postop changes in the abdomen.  Retained stool throughout the bowel could represent a degree of constipation.  No free air on the decubitus view.  No abnormal osseous finding.  IMPRESSION: Left PICC line tip SVC RA junction.  No definite acute chest or abdominal process  Possible constipation   Original Report Authenticated By: Judie Petit. Miles Costain, M.D.     Medications: . docusate sodium  100 mg Oral BID  . elvitegravir-cobicistat-emtricitabine-tenofovir  1 tablet Oral Q breakfast  . feeding supplement  237 mL Oral TID BM  . furosemide  20 mg Intravenous Once  . furosemide  40 mg Intravenous TID  . metoCLOPramide  5 mg Oral Q12H  . OxyCODONE  10 mg Oral Q12H  . pantoprazole (PROTONIX) IV  40 mg Intravenous QHS  . polyethylene glycol  17 g Oral BID  . senna  1 tablet Oral BID  . sodium chloride  10-40 mL Intracatheter Q12H  . sodium chloride  10-40 mL Intracatheter Q12H  . spironolactone  25 mg Oral Daily  . 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 - ADENOCARCINOMA EXTENDS THROUGH THE MUSCULARIS PROPRIA. - THE SURGICAL RESECTION MARGINS ARE NEGATIVE FOR ADENOCARCINOMA. -  BENIGN APPENDIX WITH FIBROUS OBLITERATION OF THE TIP. - THERE IS NO EVIDENCE OF CARCINOMA IN 30 OF 30 LYMPH NODES, (0/30). Assessment/Plan obstructing carcinoma of the right colon. Status post laparoscopy, followed with open right hemicolectomy with anastomosis. 09/23/12, Dr. Glenna Fellows.  Post op ileus- resolving  Hepatitis C  HIV on treatment  Anemia  Tobacco use  PCM prealbumin is 3.0 on 09/23/12 with fluid overload.  Hypokalemia resolved Post op anemia - stable   Plan:  She is 10 days out from her surgery.  Taking po's and having BM.  Think it's to early to take out her staples with her  malnutrition/fluid overload, and I have called the office to get her in for staple removal next week.  I will put it in the AVS when I get it from the office.  Just continue local wound care with soap and water. Dressing Prn.  We will set up Oncology follow up after she sees Dr. Johna Sheriff in the office.     LOS: 15 days    Stephenson, Ruth 10/03/2012

## 2012-10-03 NOTE — Progress Notes (Signed)
Collaborated and Agree with Felecia Shelling   PTA  As noted.

## 2012-10-03 NOTE — Progress Notes (Signed)
Patient seen and examined.  Agree with PA's note.  

## 2012-10-04 ENCOUNTER — Ambulatory Visit (HOSPITAL_COMMUNITY): Payer: Medicaid - Out of State

## 2012-10-04 ENCOUNTER — Encounter (HOSPITAL_COMMUNITY): Payer: Self-pay | Admitting: Radiology

## 2012-10-04 DIAGNOSIS — K5900 Constipation, unspecified: Secondary | ICD-10-CM

## 2012-10-04 LAB — BASIC METABOLIC PANEL
BUN: 9 mg/dL (ref 6–23)
CO2: 35 mEq/L — ABNORMAL HIGH (ref 19–32)
Chloride: 92 mEq/L — ABNORMAL LOW (ref 96–112)
GFR calc Af Amer: >90 mL/min (ref >90)
Potassium: 4.1 mEq/L (ref 3.5–5.1)

## 2012-10-04 LAB — CBC
HCT: 34.6 % — ABNORMAL LOW (ref 36.0–46.0)
Hemoglobin: 10.7 g/dL — ABNORMAL LOW (ref 12.0–15.0)
MCHC: 30.9 g/dL (ref 30.0–36.0)
MCV: 86.9 fL (ref 78.0–100.0)
WBC: 8.5 10*3/uL (ref 4.0–10.5)

## 2012-10-04 LAB — URINE CULTURE

## 2012-10-04 MED ORDER — PANTOPRAZOLE SODIUM 40 MG IV SOLR
40.0000 mg | INTRAVENOUS | Status: DC
  Administered 2012-10-04 – 2012-10-08 (×5): 40 mg via INTRAVENOUS
  Filled 2012-10-04 (×6): qty 40

## 2012-10-04 MED ORDER — HYDROMORPHONE HCL PF 2 MG/ML IJ SOLN
2.0000 mg | INTRAMUSCULAR | Status: DC | PRN
  Administered 2012-10-04: 4 mg via INTRAVENOUS
  Administered 2012-10-04 (×4): 2 mg via INTRAVENOUS
  Administered 2012-10-05 (×10): 4 mg via INTRAVENOUS
  Administered 2012-10-06 (×3): 2 mg via INTRAVENOUS
  Administered 2012-10-06: 4 mg via INTRAVENOUS
  Administered 2012-10-06: 2 mg via INTRAVENOUS
  Administered 2012-10-06: 4 mg via INTRAVENOUS
  Administered 2012-10-06 – 2012-10-07 (×2): 2 mg via INTRAVENOUS
  Filled 2012-10-04 (×2): qty 2
  Filled 2012-10-04 (×5): qty 1
  Filled 2012-10-04 (×5): qty 2
  Filled 2012-10-04: qty 1
  Filled 2012-10-04: qty 2
  Filled 2012-10-04 (×2): qty 1
  Filled 2012-10-04 (×2): qty 2
  Filled 2012-10-04: qty 1
  Filled 2012-10-04 (×2): qty 2
  Filled 2012-10-04: qty 1
  Filled 2012-10-04: qty 2

## 2012-10-04 MED ORDER — IOHEXOL 300 MG/ML  SOLN
100.0000 mL | Freq: Once | INTRAMUSCULAR | Status: AC | PRN
  Administered 2012-10-04: 100 mL via INTRAVENOUS

## 2012-10-04 MED ORDER — SODIUM CHLORIDE 0.9 % IV SOLN
INTRAVENOUS | Status: DC
  Administered 2012-10-04: 1000 mL via INTRAVENOUS
  Administered 2012-10-05 – 2012-10-07 (×5): via INTRAVENOUS

## 2012-10-04 MED ORDER — IOHEXOL 300 MG/ML  SOLN
25.0000 mL | INTRAMUSCULAR | Status: AC
  Administered 2012-10-04 (×2): 25 mL via ORAL

## 2012-10-04 MED ORDER — SORBITOL 70 % SOLN
960.0000 mL | TOPICAL_OIL | Freq: Once | ORAL | Status: AC
  Administered 2012-10-04: 960 mL via RECTAL
  Filled 2012-10-04: qty 240

## 2012-10-04 NOTE — Progress Notes (Addendum)
Patient ID: Ruth Stephenson, female   DOB: February 29, 1951, 62 y.o.   MRN: [id number]         [facility] for Infectious Disease    Date of Admission:  09/18/2012     Subjective: She is complaining of persistent, severe abdominal pain. She states that no human should have to endure this kind of pain. She had a large bowel movement yesterday. She denies any nausea or vomiting.  Objective: Temp:  [98.1 F (36.7 C)-98.8 F (37.1 C)] 98.8 F (37.1 C) (04/11 0500) Pulse Rate:  [85-99] 99 (04/11 0500) Resp:  [18] 18 (04/11 0500) BP: (107-131)/(64-81) 116/77 mmHg (04/11 0500) SpO2:  [92 %-98 %] 92 % (04/11 0500) Weight:  [75.7 kg (166 lb 14.2 oz)] 75.7 kg (166 lb 14.2 oz) (04/11 0500)  General: She is tearful Lungs: Clear Cor: Regular S1 and S2 no murmurs Abdomen: There is minimal redness around her incisions. She has a little bit of yellow-brown drainage from her right lateral incision  Lab Results Lab Results  Component Value Date   WBC 8.5 10/04/2012   HGB 10.7* 10/04/2012   HCT 34.6* 10/04/2012   MCV 86.9 10/04/2012   PLT 233 10/04/2012    Lab Results  Component Value Date   CREATININE 0.70 10/04/2012   BUN 9 10/04/2012   NA 131* 10/04/2012   K 4.1 10/04/2012   CL 92* 10/04/2012   CO2 35* 10/04/2012    Lab Results  Component Value Date   ALT 18 10/03/2012   AST 35 10/03/2012   ALKPHOS 86 10/03/2012   BILITOT 0.8 10/03/2012    Lipase 219   Microbiology: Recent Results (from the past 240 hour(s))  URINE CULTURE     Status: None   Collection Time    09/30/12  6:00 AM      Result Value Range Status   Specimen Description URINE, CLEAN CATCH   Final   Special Requests NONE   Final   Culture  Setup Time 09/30/2012 08:46   Final   Colony Count 80,000 COLONIES/ML   Final   Culture     Final   Value: VANCOMYCIN RESISTANT ENTEROCOCCUS ISOLATED     Note: CRITICAL RESULT CALLED TO, READ BACK BY AND VERIFIED WITH: DANA MCCLAIN @ 0007 ON 04/11   Report Status 10/04/2012 FINAL   Final    Organism ID, Bacteria VANCOMYCIN RESISTANT ENTEROCOCCUS ISOLATED   Final    Studies/Results: Dg Abd 1 View  10/03/2012  *RADIOLOGY REPORT*  Clinical Data: Abdominal pain, distension, constipation  ABDOMEN - 1 VIEW  Comparison: 10/01/2012  Findings: Nonobstructive bowel gas pattern.  Skin staples overlying the right mid abdomen.  Large volume stool in the colon.  IMPRESSION: Large volume stool in the colon, reflecting constipation.   Original Report Authenticated By: Charline Bills, M.D.     Assessment: Stribild can cause spurious elevation of amylase but I'm not aware that it can cause similar elevations of lipase. She may have developed postoperative pancreatitis. The VRE in her urine culture is probably an asymptomatic colonizer. I would not treat it at this time.  Plan: 1. Continue Stribild 2. Pain control  Cliffton Asters, MD [facility] for Infectious Disease [facility] [phone] pager   [phone] cell 10/04/2012, 9:24 AM

## 2012-10-04 NOTE — Progress Notes (Signed)
She c/o pain at the previous LLQ drain site.  Lipase is elevated suggesting pancreatitis.  Now on bowel rest and CT ordered.

## 2012-10-04 NOTE — Progress Notes (Signed)
This visit is the result of a referral from weekday chaplains. Arrived at 39.  Ruth Stephenson had a close friend with her and preferred to have chaplain come again. She promised to have chaplain paged when the time is most convenient to her. She admitted she has gone through a dark time recently with her cancer and medical procedures. At present she reports she is at peace, due to long time friend being with her.  Benjie Karvonen. Kaslyn Richburg, DMin Chaplain

## 2012-10-04 NOTE — Progress Notes (Signed)
Patient ID: Ruth Stephenson, female   DOB: 06-05-1951, 62 y.o.   MRN: [id number] 11 Days Post-Op  Subjective: She feels better, feels less distended today, tolerating diet well, had large BM yesterday  Objective: Vital signs in last 24 hours: Temp:  [98.1 F (36.7 C)-98.8 F (37.1 C)] 98.8 F (37.1 C) (04/11 0500) Pulse Rate:  [85-99] 99 (04/11 0500) Resp:  [18] 18 (04/11 0500) BP: (107-131)/(64-81) 116/77 mmHg (04/11 0500) SpO2:  [92 %-98 %] 92 % (04/11 0500) Weight:  [166 lb 14.2 oz (75.7 kg)] 166 lb 14.2 oz (75.7 kg) (04/11 0500) Last BM Date: 10/02/12 Fat modified Diet, I/O=?, afebrile, VSS, labs OK Intake/Output from previous day: 04/10 0701 - 04/11 0700 In: 240 [P.O.:240] Out: 2900 [Urine:2900] Intake/Output this shift:    General appearance: alert, cooperative and no distress Resp: clear to auscultation bilaterally GI: less distended, some drainage clear/yellowish from lower both trocar sites, lateral edge of incision is less red today and the wound where staple was removed is healhty  + BS Lower legs:  Edema improving. Staples intact   Lab Results:   Recent Labs  10/03/12 0605 10/04/12 0524  WBC 7.9 8.5  HGB 9.7* 10.7*  HCT 31.1* 34.6*  PLT 187 233    BMET  Recent Labs  10/03/12 0605 10/04/12 0524  NA 132* 131*  K 3.9 4.1  CL 93* 92*  CO2 35* 35*  GLUCOSE 128* 155*  BUN 9 9  CREATININE 0.73 0.70  CALCIUM 8.5 8.9   PT/INR No results found for this basename: LABPROT, INR,  in the last 72 hours   Recent Labs Lab 10/02/12 0450 10/03/12 0605  AST 40* 35  ALT 18 18  ALKPHOS 88 86  BILITOT 1.0 0.8  PROT 6.9 7.3  ALBUMIN 1.9* 1.9*     Lipase     Component Value Date/Time   LIPASE 219* 10/03/2012 2047     Studies/Results: Dg Abd 1 View  10/03/2012  *RADIOLOGY REPORT*  Clinical Data: Abdominal pain, distension, constipation  ABDOMEN - 1 VIEW  Comparison: 10/01/2012  Findings: Nonobstructive bowel gas pattern.  Skin staples overlying the  right mid abdomen.  Large volume stool in the colon.  IMPRESSION: Large volume stool in the colon, reflecting constipation.   Original Report Authenticated By: Charline Bills, M.D.     Medications: . docusate sodium  100 mg Oral BID  . elvitegravir-cobicistat-emtricitabine-tenofovir  1 tablet Oral Q breakfast  . feeding supplement  237 mL Oral TID BM  . furosemide  20 mg Intravenous Once  . furosemide  40 mg Intravenous TID  . metoCLOPramide  5 mg Oral Q12H  . OxyCODONE  10 mg Oral Q12H  . pantoprazole  40 mg Oral Daily  . polyethylene glycol  17 g Oral BID  . senna  1 tablet Oral BID  . sodium chloride  10-40 mL Intracatheter Q12H  . sodium chloride  10-40 mL Intracatheter Q12H  . sorbitol, milk of mag, mineral oil, glycerin (SMOG) enema  960 mL Rectal Once  . spironolactone  25 mg Oral Daily  . 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 - ADENOCARCINOMA EXTENDS THROUGH THE MUSCULARIS PROPRIA. - THE SURGICAL RESECTION MARGINS ARE NEGATIVE FOR ADENOCARCINOMA. - BENIGN APPENDIX WITH FIBROUS OBLITERATION OF THE TIP. - THERE IS NO EVIDENCE OF CARCINOMA IN 30 OF 30 LYMPH NODES, (0/30). Assessment/Plan obstructing carcinoma of the right  colon. Status post laparoscopy, followed with open right hemicolectomy with anastomosis. 09/23/12, Dr. Glenna Fellows.  Post op ileus- resolving  Hepatitis C  HIV on treatment  Anemia  Tobacco use  PCM prealbumin is 3.0 on 09/23/12 with fluid overload.  Hypokalemia resolved Post op anemia - stable   Plan:  POD#11  Taking po's and having BM.  Staple removal next week.   Just continue local wound care with soap and water. Dressing Prn.  We will set up Oncology follow up after she sees Dr. Johna Sheriff in the office.     LOS: 16 days    Ruth Stephenson 10/04/2012

## 2012-10-04 NOTE — Progress Notes (Signed)
Pt urine cultures show 80,000 coloonies of VRE message from [facility].

## 2012-10-04 NOTE — Progress Notes (Signed)
[facility] PROGRESS NOTE  DIANIA CO ZOX:[id number] DOB: 09-Sep-1950 DOA: 09/18/2012 PCP: No primary provider on file.  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 Johna Sheriff. Postop ileus resolving. Patient stated had a small bowel movement yesterday. Patient with less abdominal distention but more lower abdominal pain. Wound site with some draining ascites. Continue diuretics will fluid overload. Continue local wound care with soap and water. Dressing as needed. Per general surgery. Continue pain management. Wi him it isn't like his doctor's visit ll need to followup with general surgery and oncology as outpatient.  #2 severe abdominal pain  likely secondary to acute pancreatitis and constipation. Lipase levels elevated. Will give a smog enema. Will discontinue IV Lasix. Follow. Will hydrate gently. Check a CT of the abdomen and pelvis. Follow.  #3 acute pancreatitis Will discontinue IV Lasix for now. Hydrate gently with IV fluids. Bowel rest. Supportive care. Pain management. Follow.  #4 low-grade fever Stable. Afebrile x24 hours. Per ID monitor off antibiotics.  #5 resolving ileus/constipation Patient with small bowel movement yesterday. Patient given another enema today. Patient with significant stool on xray. SMOG enema today.  #6 ascites/anasarca/lower extremity edema Likely secondary to low albumin state and perioperative fluids. Continue Aldactone. D/C IV Lasix. Monitor closely.  #7 HIV CD4 count of 150. Viral load 41,785. Continue Stribild and Bactrim daily. ID following and appreciate input and recommendations.  #8 history of hepatitis C  likely need to followup with ID as outpatient.  #9 acute blood loss anemia versus anemia of chronic disease Status post 2 units packed red blood cells and FFP perioperatively. Follow H&H. Transfusion  threshold hemoglobin less than 7.  #10 hyponatremia Improvement. Follow.  Code Status: Full Family Communication: Updated patient no family present Disposition Plan: Home when medically stable versus SNF   Consultants:  Oncology: Dr. Myna Hidalgo 09/21/2012  General surgery: Dr. Abbey Chatters of 09/20/2012  Gastroenterology: Dr. Elnoria Howard 09/19/2012  Infectious diseases: Dr. Ninetta Lights 09/19/2012  Procedures:  Colonoscopy: 09/20/2012 per Dr. Elnoria Howard  Open right hemi-colectomy with anastomosis and laparoscopy Dr. Johna Sheriff  09/23/2012  2 units packed red blood cells and 2 units FFP 09/23/2012  Antibiotics:  Bactrim 09/19/2012 for prophylaxis  HPI/Subjective: Patient c/o severe abdominal pain. Patient stated had BM yesterday after enema.   Objective: Filed Vitals:   10/03/12 1618 10/03/12 2153 10/04/12 0500 10/04/12 0950  BP: 115/75 107/64 116/77 135/78  Pulse: 95 85 99 101  Temp:  98.6 F (37 C) 98.8 F (37.1 C) 98.4 F (36.9 C)  TempSrc:  Oral Oral Oral  Resp:  18 18 20   Height:      Weight:   75.7 kg (166 lb 14.2 oz)   SpO2: 98% 97% 92% 94%    Intake/Output Summary (Last 24 hours) at 10/04/12 1130 Last data filed at 10/04/12 1100  Gross per 24 hour  Intake    480 ml  Output   3300 ml  Net  -2820 ml   Filed Weights   10/01/12 0500 10/02/12 0633 10/04/12 0500  Weight: 72.757 kg (160 lb 6.4 oz) 75.5 kg (166 lb 7.2 oz) 75.7 kg (166 lb 14.2 oz)    Exam:   General:  NAD  Cardiovascular: RRR  Respiratory: CTAB  Abdomen: Distended, positive bowel sounds, tenderness to palpation diffusely more in the lower quadrants, staples in place.  Extremities: No clubbing no cyanosis. 1-2+ bilateral lower extremity edema.  Data Reviewed: Basic Metabolic Panel:  Recent Labs Lab 09/28/12 0330 09/30/12 0615 10/02/12 0450 10/03/12 0605 10/04/12 0524  NA 131* 133* 132* 132* 131*  K 3.4* 3.5 3.5 3.9 4.1  CL 96 96 94* 93* 92*  CO2 32 32 34* 35* 35*  GLUCOSE 110* 108* 155*  128* 155*  BUN 6 5* 8 9 9   CREATININE 0.62 0.58 0.66 0.73 0.70  CALCIUM 8.2* 8.2* 8.3* 8.5 8.9  MG  --   --  1.6 1.9  --    Liver Function Tests:  Recent Labs Lab 10/02/12 0450 10/03/12 0605  AST 40* 35  ALT 18 18  ALKPHOS 88 86  BILITOT 1.0 0.8  PROT 6.9 7.3  ALBUMIN 1.9* 1.9*    Recent Labs Lab 10/03/12 2047 10/04/12 0524  LIPASE 219* 248*   No results found for this basename: AMMONIA,  in the last 168 hours CBC:  Recent Labs Lab 09/28/12 0330 09/30/12 0615 10/03/12 0605 10/04/12 0524  WBC 6.5 6.3 7.9 8.5  HGB 10.4* 10.2* 9.7* 10.7*  HCT 32.8* 32.3* 31.1* 34.6*  MCV 83.9 84.1 86.1 86.9  PLT 136* 152 187 233   Cardiac Enzymes:  Recent Labs Lab 10/01/12 1610  TROPONINI <0.30   BNP (last 3 results) No results found for this basename: PROBNP,  in the last 8760 hours CBG: No results found for this basename: GLUCAP,  in the last 168 hours  Recent Results (from the past 240 hour(s))  URINE CULTURE     Status: None   Collection Time    09/30/12  6:00 AM      Result Value Range Status   Specimen Description URINE, CLEAN CATCH   Final   Special Requests NONE   Final   Culture  Setup Time 09/30/2012 08:46   Final   Colony Count 80,000 COLONIES/ML   Final   Culture     Final   Value: VANCOMYCIN RESISTANT ENTEROCOCCUS ISOLATED     Note: CRITICAL RESULT CALLED TO, READ BACK BY AND VERIFIED WITH: DANA MCCLAIN @ 0007 ON 04/11   Report Status 10/04/2012 FINAL   Final   Organism ID, Bacteria VANCOMYCIN RESISTANT ENTEROCOCCUS ISOLATED   Final     Studies: Dg Abd 1 View  10/03/2012  *RADIOLOGY REPORT*  Clinical Data: Abdominal pain, distension, constipation  ABDOMEN - 1 VIEW  Comparison: 10/01/2012  Findings: Nonobstructive bowel gas pattern.  Skin staples overlying the right mid abdomen.  Large volume stool in the colon.  IMPRESSION: Large volume stool in the colon, reflecting constipation.   Original Report Authenticated By: Charline Bills, M.D.      Scheduled Meds: . docusate sodium  100 mg Oral BID  . elvitegravir-cobicistat-emtricitabine-tenofovir  1 tablet Oral Q breakfast  . feeding supplement  237 mL Oral TID BM  . furosemide  20 mg Intravenous Once  . metoCLOPramide  5 mg Oral Q12H  . OxyCODONE  10 mg Oral Q12H  . pantoprazole (PROTONIX) IV  40 mg Intravenous Q24H  . polyethylene glycol  17 g Oral BID  . senna  1 tablet Oral BID  . sodium chloride  10-40 mL Intracatheter Q12H  . sodium chloride  10-40 mL Intracatheter Q12H  . spironolactone  25 mg Oral Daily  . sulfamethoxazole-trimethoprim  1 tablet Oral Daily   Continuous Infusions:   Principal Problem:   Abdominal pain Active Problems:   Pancreatitis   Colonic mass - cecum   HIV (human immunodeficiency virus infection)   Hepatitis C   Tobacco abuse   Anemia  Unspecified constipation   Ileus, postoperative    Time spent: > 35 mins    [facility]  [facility] Pager [phone]. If 7PM-7AM, please contact night-coverage at [email or web address], password [facility] 10/04/2012, 11:30 AM  LOS: 16 days

## 2012-10-04 NOTE — Progress Notes (Signed)
NUTRITION FOLLOW UP  Intervention:   - Recommend post pyloric TF as nutrition source if diet unable to be resumed in next 1-2 days - Diet advancement per MD - Will continue to monitor   Nutrition Dx:   Increased nutrient needs related to HIV, hepatitis C, as evidenced by MD notes - ongoing   Goal:   Advance diet as tolerated to low sodium diet - not met  Monitor:   Weights, labs, diet advancement, BM, abdominal pain  Assessment:   Pt with persistent severe abdominal pain, made NPO. Pt was on fat modified diet yesterday but states she only had a bowl of cereal all day which she reportedly tolerated. Surgeon thinks pain likely pancreatitis and ordered bowel rest and CT scan. Pt's weight up 14 pounds since admission. Pt with significantly elevated lipase of 248 U/L today.   Height: Ht Readings from Last 1 Encounters:  09/19/12 5' 3.5" (1.613 m)    Weight Status:   Wt Readings from Last 1 Encounters:  10/04/12 166 lb 14.2 oz (75.7 kg)    Re-estimated needs:  Kcal: 1850-2100  Protein: 95-105  Fluid: 1.8-2.1L/day   Skin: +1 generalized edema   Diet Order: NPO   Intake/Output Summary (Last 24 hours) at 10/04/12 1421 Last data filed at 10/04/12 1400  Gross per 24 hour  Intake    565 ml  Output   2001 ml  Net  -1436 ml    Last BM: 4/9    Labs:   Recent Labs Lab 09/30/12 0615 10/02/12 0450 10/03/12 0605 10/04/12 0524  NA 133* 132* 132* 131*  K 3.5 3.5 3.9 4.1  CL 96 94* 93* 92*  CO2 32 34* 35* 35*  BUN 5* 8 9 9   CREATININE 0.58 0.66 0.73 0.70  CALCIUM 8.2* 8.3* 8.5 8.9  MG  --  1.6 1.9  --   GLUCOSE 108* 155* 128* 155*    CBG (last 3)  No results found for this basename: GLUCAP,  in the last 72 hours  Scheduled Meds: . docusate sodium  100 mg Oral BID  . elvitegravir-cobicistat-emtricitabine-tenofovir  1 tablet Oral Q breakfast  . feeding supplement  237 mL Oral TID BM  . furosemide  20 mg Intravenous Once  . iohexol  25 mL Oral Q1 Hr x 2  .  metoCLOPramide  5 mg Oral Q12H  . OxyCODONE  10 mg Oral Q12H  . pantoprazole (PROTONIX) IV  40 mg Intravenous Q24H  . polyethylene glycol  17 g Oral BID  . senna  1 tablet Oral BID  . sodium chloride  10-40 mL Intracatheter Q12H  . sodium chloride  10-40 mL Intracatheter Q12H  . spironolactone  25 mg Oral Daily  . sulfamethoxazole-trimethoprim  1 tablet Oral Daily     Levon Hedger MS, RD, [place] [phone] Pager [phone] After Hours Pager

## 2012-10-05 LAB — CBC
HCT: 33.9 % — ABNORMAL LOW (ref 36.0–46.0)
Hemoglobin: 10.5 g/dL — ABNORMAL LOW (ref 12.0–15.0)
MCH: 27.1 pg (ref 26.0–34.0)
MCV: 87.4 fL (ref 78.0–100.0)
RBC: 3.88 MIL/uL (ref 3.87–5.11)

## 2012-10-05 LAB — URINALYSIS, ROUTINE W REFLEX MICROSCOPIC
Ketones, ur: NEGATIVE mg/dL
Nitrite: NEGATIVE
Specific Gravity, Urine: 1.029 (ref 1.005–1.030)
pH: 6 (ref 5.0–8.0)

## 2012-10-05 LAB — URINE MICROSCOPIC-ADD ON

## 2012-10-05 LAB — BASIC METABOLIC PANEL
CO2: 31 mEq/L (ref 19–32)
Glucose, Bld: 91 mg/dL (ref 70–99)
Potassium: 4.3 mEq/L (ref 3.5–5.1)
Sodium: 132 mEq/L — ABNORMAL LOW (ref 135–145)

## 2012-10-05 MED ORDER — SORBITOL 70 % SOLN
960.0000 mL | TOPICAL_OIL | Freq: Once | ORAL | Status: AC
  Administered 2012-10-05: 960 mL via RECTAL
  Filled 2012-10-05: qty 240

## 2012-10-05 NOTE — Progress Notes (Signed)
Patient ID: Ruth Stephenson, female   DOB: [birth year], 62 y.o.   MRN: [id number]         [facility]    Date of Admission:  09/18/2012     Principal Problem:   Abdominal pain Active Problems:   Pancreatitis   Colonic mass - cecum   HIV (human immunodeficiency virus infection)   Hepatitis C   Tobacco abuse   Anemia   Unspecified constipation   Ileus, postoperative   . docusate sodium  100 mg Oral BID  . elvitegravir-cobicistat-emtricitabine-tenofovir  1 tablet Oral Q breakfast  . feeding supplement  237 mL Oral TID BM  . furosemide  20 mg Intravenous Once  . metoCLOPramide  5 mg Oral Q12H  . OxyCODONE  10 mg Oral Q12H  . pantoprazole (PROTONIX) IV  40 mg Intravenous Q24H  . polyethylene glycol  17 g Oral BID  . senna  1 tablet Oral BID  . sodium chloride  10-40 mL Intracatheter Q12H  . sodium chloride  10-40 mL Intracatheter Q12H  . spironolactone  25 mg Oral Daily  . sulfamethoxazole-trimethoprim  1 tablet Oral Daily    Subjective: Pain is a little bit better.  Objective: Temp:  [98.2 F (36.8 C)-98.6 F (37 C)] 98.6 F (37 C) (04/12 0600) Pulse Rate:  [89-102] 95 (04/12 0600) Resp:  [20] 20 (04/12 0600) BP: (97-146)/(62-78) 97/62 mmHg (04/12 0600) SpO2:  [96 %-97 %] 96 % (04/12 0600) Weight:  [65.318 kg (144 lb)] 65.318 kg (144 lb) (04/12 0600)  Abdomen: Remains distended. Incisions look okay.  Lab Results Lab Results  Component Value Date   WBC 8.1 10/05/2012   HGB 10.5* 10/05/2012   HCT 33.9* 10/05/2012   MCV 87.4 10/05/2012   PLT 246 10/05/2012    Lab Results  Component Value Date   CREATININE 0.66 10/05/2012   BUN 8 10/05/2012   NA 132* 10/05/2012   K 4.3 10/05/2012   CL 97 10/05/2012   CO2 31 10/05/2012    Lab Results  Component Value Date   ALT 18 10/03/2012   AST 35 10/03/2012   ALKPHOS 86 10/03/2012   BILITOT 0.8 10/03/2012      Studies/Results: Dg Abd 1 View  10/03/2012  *RADIOLOGY REPORT*  Clinical Data: Abdominal  pain, distension, constipation  ABDOMEN - 1 VIEW  Comparison: 10/01/2012  Findings: Nonobstructive bowel gas pattern.  Skin staples overlying the right mid abdomen.  Large volume stool in the colon.  IMPRESSION: Large volume stool in the colon, reflecting constipation.   Original Report Authenticated By: Charline Bills, M.D.    Ct Abdomen Pelvis W Contrast  10/04/2012  *RADIOLOGY REPORT*  Clinical Data: Diffuse abdominal pain.  History of partial colectomy for colon mass.  Hepatitis C.  CT ABDOMEN AND PELVIS WITH CONTRAST  Technique:  Multidetector CT imaging of the abdomen and pelvis was performed following the standard protocol during bolus administration of intravenous contrast.  Contrast: OMNIPAQUE IOHEXOL 300 MG/ML  SOLN  Comparison: CT abdomen and pelvis 09/19/2012.  Findings: There is some dependent atelectasis in the lung bases.  The liver demonstrates a markedly nodular border and appears shrunken consistent with cirrhosis.  There is a small volume of ascites.  As seen on the prior study, there is splenomegaly. Changes of portal hypertension are noted with recanalization of the umbilical vein and perigastric and splenic varices.  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  No focal liver lesion is identified.  The adrenal glands, pancreas and  right kidney are unremarkable. There is a tiny left renal cyst, unchanged.  Postoperative change of right hemicolectomy is noted with a surgical seen in the right upper quadrant.  There is a massive volume of stool in the remaining ascending colon and throughout the transverse colon.  Walls of the colon from the splenic flexure through the rectum appear thickened although these structures are incompletely distended.  The stomach and small bowel are unremarkable.  Multiple small retroperitoneal lymph nodes are unchanged.  IMPRESSION:  1.  Status post partial right hemicolectomy with a massive volume stool in the remaining ascending and transverse colon.  Wall  thickening of the colon from the splenic flexure through the rectum may be due to underdistension but inflammatory process could create a similar appearance. 2.  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   Original Report Authenticated By: Holley Dexter, M.D.     Assessment: Her serum lipase is elevated but there is no evidence of pancreatic inflammation on CT scan. She does have significant constipation. I will continue current antiretroviral therapy and plan on following her up in my clinic shortly after discharge.  Plan: 1. Continue Stribild 2. I will followup on Monday, April 14  Cliffton Asters, MD [facility] [phone] pager   [phone] cell 10/05/2012, 10:36 AM

## 2012-10-05 NOTE — Progress Notes (Signed)
[facility] PROGRESS NOTE  SHEYLA ZAFFINO ZOX:[id number] DOB: April 03, 1951 DOA: 09/18/2012 PCP: No primary provider on file.  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 Johna Sheriff. Postop ileus resolving. Patient stated had a small bowel movement yesterday. Patient with less abdominal distention but more lower abdominal pain. Wound site with some draining ascites. Continue diuretics will fluid overload. Continue local wound care with soap and water. Dressing as needed. Per general surgery. Continue pain management. Follow up with general surgery and oncology as outpatient.  #2 severe abdominal pain  likely secondary to acute pancreatitis and constipation. Lipase levels elevated. Will repeat smog enema. Will hydrate gently. CT of the abdomen and pelvis with massive stool. Follow.  #3 acute pancreatitis Hydrate gently with IV fluids. Bowel rest. Supportive care. Pain management. Follow.  #4 low-grade fever Stable. Afebrile x48 hours. Per ID monitor off antibiotics.  #5 resolving ileus/constipation Patient with small bowel movement yesterday. Patient given another enema today. Patient with significant stool on CT scan. Repeat SMOG enema today.  #6 ascites/anasarca/lower extremity edema Likely secondary to low albumin state and perioperative fluids. Continue Aldactone.  Monitor closely.  #7 HIV CD4 count of 150. Viral load 41,785. Continue Stribild and Bactrim daily. ID following and appreciate input and recommendations.  #8 history of hepatitis C  likely need to followup with ID as outpatient.  #9 acute blood loss anemia versus anemia of chronic disease Status post 2 units packed red blood cells and FFP perioperatively. Follow H&H. Transfusion threshold hemoglobin less than 7.  #10 hyponatremia Improvement. Follow.  Code Status: Full Family  Communication: Updated patient no family present Disposition Plan: Home when medically stable versus SNF   Consultants:  Oncology: Dr. Myna Hidalgo 09/21/2012  General surgery: Dr. Abbey Chatters of 09/20/2012  Gastroenterology: Dr. Elnoria Howard 09/19/2012  Infectious diseases: Dr. Ninetta Lights 09/19/2012  Procedures:  Colonoscopy: 09/20/2012 per Dr. Elnoria Howard  Open right hemi-colectomy with anastomosis and laparoscopy Dr. Johna Sheriff  09/23/2012  2 units packed red blood cells and 2 units FFP 09/23/2012  CT abd pelvis 10/04/12  Antibiotics:  Bactrim 09/19/2012 for prophylaxis  HPI/Subjective: Patient c/o abdominal pain. Patient stated had BM yesterday after enema.   Objective: Filed Vitals:   10/04/12 0950 10/04/12 1333 10/04/12 2200 10/05/12 0600  BP: 135/78 146/78 129/69 97/62  Pulse: 101 102 89 95  Temp: 98.4 F (36.9 C) 98.5 F (36.9 C) 98.2 F (36.8 C) 98.6 F (37 C)  TempSrc: Oral Oral Oral Oral  Resp: 20 20 20 20   Height:      Weight:    65.318 kg (144 lb)  SpO2: 94% 97% 96% 96%    Intake/Output Summary (Last 24 hours) at 10/05/12 1214 Last data filed at 10/05/12 0600  Gross per 24 hour  Intake   1340 ml  Output   1001 ml  Net    339 ml   Filed Weights   10/02/12 0633 10/04/12 0500 10/05/12 0600  Weight: 75.5 kg (166 lb 7.2 oz) 75.7 kg (166 lb 14.2 oz) 65.318 kg (144 lb)    Exam:   General:  NAD  Cardiovascular: RRR  Respiratory: CTAB  Abdomen: Distended, positive bowel sounds, tenderness to palpation in epigastrium and diffusely  in the lower quadrants, staples in place.  Extremities: No clubbing no cyanosis. 1+ bilateral lower extremity edema.  Data Reviewed: Basic Metabolic Panel:  Recent Labs Lab 09/30/12 0615 10/02/12 8119 10/03/12 1478 10/04/12 0524 10/05/12 0535  NA 133* 132* 132* 131* 132*  K 3.5 3.5 3.9 4.1 4.3  CL 96 94* 93* 92* 97  CO2 32 34* 35* 35* 31  GLUCOSE 108* 155* 128* 155* 91  BUN 5* 8 9 9 8   CREATININE 0.58 0.66 0.73 0.70 0.66   CALCIUM 8.2* 8.3* 8.5 8.9 8.8  MG  --  1.6 1.9  --  1.9   Liver Function Tests:  Recent Labs Lab 10/02/12 0450 10/03/12 0605  AST 40* 35  ALT 18 18  ALKPHOS 88 86  BILITOT 1.0 0.8  PROT 6.9 7.3  ALBUMIN 1.9* 1.9*    Recent Labs Lab 10/03/12 2047 10/04/12 0524 10/05/12 0535  LIPASE 219* 248* 141*   No results found for this basename: AMMONIA,  in the last 168 hours CBC:  Recent Labs Lab 09/30/12 0615 10/03/12 0605 10/04/12 0524 10/05/12 0535  WBC 6.3 7.9 8.5 8.1  HGB 10.2* 9.7* 10.7* 10.5*  HCT 32.3* 31.1* 34.6* 33.9*  MCV 84.1 86.1 86.9 87.4  PLT 152 187 233 246   Cardiac Enzymes:  Recent Labs Lab 10/01/12 1610  TROPONINI <0.30   BNP (last 3 results) No results found for this basename: PROBNP,  in the last 8760 hours CBG: No results found for this basename: GLUCAP,  in the last 168 hours  Recent Results (from the past 240 hour(s))  URINE CULTURE     Status: None   Collection Time    09/30/12  6:00 AM      Result Value Range Status   Specimen Description URINE, CLEAN CATCH   Final   Special Requests NONE   Final   Culture  Setup Time 09/30/2012 08:46   Final   Colony Count 80,000 COLONIES/ML   Final   Culture     Final   Value: VANCOMYCIN RESISTANT ENTEROCOCCUS ISOLATED     Note: CRITICAL RESULT CALLED TO, READ BACK BY AND VERIFIED WITH: DANA MCCLAIN @ 0007 ON 04/11   Report Status 10/04/2012 FINAL   Final   Organism ID, Bacteria VANCOMYCIN RESISTANT ENTEROCOCCUS ISOLATED   Final     Studies: Dg Abd 1 View  10/03/2012  *RADIOLOGY REPORT*  Clinical Data: Abdominal pain, distension, constipation  ABDOMEN - 1 VIEW  Comparison: 10/01/2012  Findings: Nonobstructive bowel gas pattern.  Skin staples overlying the right mid abdomen.  Large volume stool in the colon.  IMPRESSION: Large volume stool in the colon, reflecting constipation.   Original Report Authenticated By: Charline Bills, M.D.    Ct Abdomen Pelvis W Contrast  10/04/2012  *RADIOLOGY  REPORT*  Clinical Data: Diffuse abdominal pain.  History of partial colectomy for colon mass.  Hepatitis C.  CT ABDOMEN AND PELVIS WITH CONTRAST  Technique:  Multidetector CT imaging of the abdomen and pelvis was performed following the standard protocol during bolus administration of intravenous contrast.  Contrast: OMNIPAQUE IOHEXOL 300 MG/ML  SOLN  Comparison: CT abdomen and pelvis 09/19/2012.  Findings: There is some dependent atelectasis in the lung bases.  The liver demonstrates a markedly nodular border and appears shrunken consistent with cirrhosis.  There is a small volume of ascites.  As seen on the prior study, there is splenomegaly. Changes of portal hypertension are noted with recanalization of the umbilical vein and perigastric and splenic varices.  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  No focal liver lesion is identified.  The adrenal glands, pancreas and right kidney are unremarkable. There is a tiny left renal cyst, unchanged.  Postoperative change of right hemicolectomy  is noted with a surgical seen in the right upper quadrant.  There is a massive volume of stool in the remaining ascending colon and throughout the transverse colon.  Walls of the colon from the splenic flexure through the rectum appear thickened although these structures are incompletely distended.  The stomach and small bowel are unremarkable.  Multiple small retroperitoneal lymph nodes are unchanged.  IMPRESSION:  1.  Status post partial right hemicolectomy with a massive volume stool in the remaining ascending and transverse colon.  Wall thickening of the colon from the splenic flexure through the rectum may be due to underdistension but inflammatory process could create a similar appearance. 2.  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   Original Report Authenticated By: Holley Dexter, M.D.     Scheduled Meds: . docusate sodium  100 mg Oral BID  .  elvitegravir-cobicistat-emtricitabine-tenofovir  1 tablet Oral Q breakfast  . feeding supplement  237 mL Oral TID BM  . furosemide  20 mg Intravenous Once  . metoCLOPramide  5 mg Oral Q12H  . OxyCODONE  10 mg Oral Q12H  . pantoprazole (PROTONIX) IV  40 mg Intravenous Q24H  . polyethylene glycol  17 g Oral BID  . senna  1 tablet Oral BID  . sodium chloride  10-40 mL Intracatheter Q12H  . sodium chloride  10-40 mL Intracatheter Q12H  . spironolactone  25 mg Oral Daily  . sulfamethoxazole-trimethoprim  1 tablet Oral Daily   Continuous Infusions: . sodium chloride 100 mL/hr at 10/05/12 1610    Principal Problem:   Abdominal pain Active Problems:   Pancreatitis   Colonic mass - cecum   HIV (human immunodeficiency virus infection)   Hepatitis C   Tobacco abuse   Anemia   Unspecified constipation   Ileus, postoperative    Time spent: > 35 mins    [facility]  [facility] Pager [phone]. If 7PM-7AM, please contact night-coverage at [email or web address], password [facility] 10/05/2012, 12:14 PM  LOS: 17 days

## 2012-10-05 NOTE — Progress Notes (Signed)
Patient ID: Ruth Stephenson, female   DOB: Jul 16, 1950, 62 y.o.   MRN: [id number] [facility] Surgery Progress Note:   12 Days Post-Op  Subjective: Mental status is clear.   Objective: Vital signs in last 24 hours: Temp:  [98.2 F (36.8 C)-98.6 F (37 C)] 98.6 F (37 C) (04/12 0600) Pulse Rate:  [89-102] 95 (04/12 0600) Resp:  [20] 20 (04/12 0600) BP: (97-146)/(62-78) 97/62 mmHg (04/12 0600) SpO2:  [96 %-97 %] 96 % (04/12 0600) Weight:  [144 lb (65.318 kg)] 144 lb (65.318 kg) (04/12 0600)  Intake/Output from previous day: 04/11 0701 - 04/12 0700 In: 1580 [P.O.:565; I.V.:1015] Out: 1401 [Urine:1400; Stool:1] Intake/Output this shift:    Physical Exam: Work of breathing is normal.  Abdomen is distended.  Staple lines are intact.  Has resolving pancreatitis and pain is better.    Lab Results:  Results for orders placed during the hospital encounter of 09/18/12 (from the past 48 hour(s))  LACTIC ACID, PLASMA     Status: None   Collection Time    10/03/12  6:30 PM      Result Value Range   Lactic Acid, Venous 1.4  0.5 - 2.2 mmol/L  LIPASE, BLOOD     Status: Abnormal   Collection Time    10/03/12  8:47 PM      Result Value Range   Lipase 219 (*) 11 - 59 U/L  BASIC METABOLIC PANEL     Status: Abnormal   Collection Time    10/04/12  5:24 AM      Result Value Range   Sodium 131 (*) 135 - 145 mEq/L   Potassium 4.1  3.5 - 5.1 mEq/L   Chloride 92 (*) 96 - 112 mEq/L   CO2 35 (*) 19 - 32 mEq/L   Glucose, Bld 155 (*) 70 - 99 mg/dL   BUN 9  6 - 23 mg/dL   Creatinine, Ser 4.09  0.50 - 1.10 mg/dL   Calcium 8.9  8.4 - 81.1 mg/dL   GFR calc non Af Amer >90  >90 mL/min   GFR calc Af Amer >90  >90 mL/min   Comment:            The eGFR has been calculated     using the CKD EPI equation.     This calculation has not been     validated in all clinical     situations.     eGFR's persistently     <90 mL/min signify     possible Chronic Kidney Disease.  CBC     Status: Abnormal    Collection Time    10/04/12  5:24 AM      Result Value Range   WBC 8.5  4.0 - 10.5 K/uL   RBC 3.98  3.87 - 5.11 MIL/uL   Hemoglobin 10.7 (*) 12.0 - 15.0 g/dL   HCT 91.4 (*) 78.2 - 95.6 %   MCV 86.9  78.0 - 100.0 fL   MCH 26.9  26.0 - 34.0 pg   MCHC 30.9  30.0 - 36.0 g/dL   RDW 21.3 (*) 08.6 - 57.8 %   Platelets 233  150 - 400 K/uL  LIPASE, BLOOD     Status: Abnormal   Collection Time    10/04/12  5:24 AM      Result Value Range   Lipase 248 (*) 11 - 59 U/L  BASIC METABOLIC PANEL     Status: Abnormal   Collection Time  10/05/12  5:35 AM      Result Value Range   Sodium 132 (*) 135 - 145 mEq/L   Potassium 4.3  3.5 - 5.1 mEq/L   Chloride 97  96 - 112 mEq/L   CO2 31  19 - 32 mEq/L   Glucose, Bld 91  70 - 99 mg/dL   BUN 8  6 - 23 mg/dL   Creatinine, Ser 1.61  0.50 - 1.10 mg/dL   Calcium 8.8  8.4 - 09.6 mg/dL   GFR calc non Af Amer >90  >90 mL/min   GFR calc Af Amer >90  >90 mL/min   Comment:            The eGFR has been calculated     using the CKD EPI equation.     This calculation has not been     validated in all clinical     situations.     eGFR's persistently     <90 mL/min signify     possible Chronic Kidney Disease.  CBC     Status: Abnormal   Collection Time    10/05/12  5:35 AM      Result Value Range   WBC 8.1  4.0 - 10.5 K/uL   RBC 3.88  3.87 - 5.11 MIL/uL   Hemoglobin 10.5 (*) 12.0 - 15.0 g/dL   HCT 04.5 (*) 40.9 - 81.1 %   MCV 87.4  78.0 - 100.0 fL   MCH 27.1  26.0 - 34.0 pg   MCHC 31.0  30.0 - 36.0 g/dL   RDW 91.4 (*) 78.2 - 95.6 %   Platelets 246  150 - 400 K/uL  MAGNESIUM     Status: None   Collection Time    10/05/12  5:35 AM      Result Value Range   Magnesium 1.9  1.5 - 2.5 mg/dL  LIPASE, BLOOD     Status: Abnormal   Collection Time    10/05/12  5:35 AM      Result Value Range   Lipase 141 (*) 11 - 59 U/L    Radiology/Results: Dg Abd 1 View  10/03/2012  *RADIOLOGY REPORT*  Clinical Data: Abdominal pain, distension, constipation   ABDOMEN - 1 VIEW  Comparison: 10/01/2012  Findings: Nonobstructive bowel gas pattern.  Skin staples overlying the right mid abdomen.  Large volume stool in the colon.  IMPRESSION: Large volume stool in the colon, reflecting constipation.   Original Report Authenticated By: Charline Bills, M.D.    Ct Abdomen Pelvis W Contrast  10/04/2012  *RADIOLOGY REPORT*  Clinical Data: Diffuse abdominal pain.  History of partial colectomy for colon mass.  Hepatitis C.  CT ABDOMEN AND PELVIS WITH CONTRAST  Technique:  Multidetector CT imaging of the abdomen and pelvis was performed following the standard protocol during bolus administration of intravenous contrast.  Contrast: OMNIPAQUE IOHEXOL 300 MG/ML  SOLN  Comparison: CT abdomen and pelvis 09/19/2012.  Findings: There is some dependent atelectasis in the lung bases.  The liver demonstrates a markedly nodular border and appears shrunken consistent with cirrhosis.  There is a small volume of ascites.  As seen on the prior study, there is splenomegaly. Changes of portal hypertension are noted with recanalization of the umbilical vein and perigastric and splenic varices.  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  No focal liver lesion is identified.  The adrenal glands, pancreas and right kidney are unremarkable. There is a tiny left renal cyst, unchanged.  Postoperative change of right  hemicolectomy is noted with a surgical seen in the right upper quadrant.  There is a massive volume of stool in the remaining ascending colon and throughout the transverse colon.  Walls of the colon from the splenic flexure through the rectum appear thickened although these structures are incompletely distended.  The stomach and small bowel are unremarkable.  Multiple small retroperitoneal lymph nodes are unchanged.  IMPRESSION:  1.  Status post partial right hemicolectomy with a massive volume stool in the remaining ascending and transverse colon.  Wall thickening of the colon from the splenic  flexure through the rectum may be due to underdistension but inflammatory process could create a similar appearance. 2.  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   Original Report Authenticated By: Holley Dexter, M.D.     Anti-infectives: Anti-infectives   Start     Dose/Rate Route Frequency Ordered Stop   09/30/12 1000  fluconazole (DIFLUCAN) tablet 150 mg     150 mg Oral  Once 09/30/12 0857 09/30/12 0939   09/24/12 1000  elvitegravir-cobicistat-emtricitabine-tenofovir (STRIBILD) 150-150-200-300 MG tablet 1 tablet     1 tablet Oral Daily with breakfast 09/24/12 0826     09/20/12 1400  piperacillin-tazobactam (ZOSYN) IVPB 3.375 g  Status:  Discontinued     3.375 g 12.5 mL/hr over 240 Minutes Intravenous 3 times per day 09/20/12 1331 09/24/12 1407   09/19/12 1600  sulfamethoxazole-trimethoprim (BACTRIM,SEPTRA) 400-80 MG per tablet 1 tablet     1 tablet Oral Daily 09/19/12 1524     09/19/12 1200  cefoTAXime (CLAFORAN) 1 g in dextrose 5 % 50 mL IVPB  Status:  Discontinued     1 g 100 mL/hr over 30 Minutes Intravenous 4 times per day 09/19/12 0912 09/20/12 1323   09/19/12 1000  metroNIDAZOLE (FLAGYL) tablet 500 mg  Status:  Discontinued     500 mg Oral 3 times per day 09/19/12 0906 09/20/12 1323   09/19/12 1000  ciprofloxacin (CIPRO) IVPB 400 mg  Status:  Discontinued     400 mg 200 mL/hr over 60 Minutes Intravenous Every 12 hours 09/19/12 0912 09/19/12 0935   09/19/12 0800  elvitegravir-cobicistat-emtricitabine-tenofovir (STRIBILD) 150-150-200-300 MG tablet 1 tablet  Status:  Discontinued     1 tablet Oral Daily with breakfast 09/19/12 0512 09/23/12 1601   09/19/12 0600  cefTRIAXone (ROCEPHIN) 1 g in dextrose 5 % 50 mL IVPB  Status:  Discontinued     1 g 100 mL/hr over 30 Minutes Intravenous Every 24 hours 09/19/12 0512 09/19/12 1610      Assessment/Plan: Problem List: Patient Active Problem List  Diagnosis  . Abdominal pain  .  Pancreatitis  . Colonic mass - cecum  . HIV (human immunodeficiency virus infection)  . Hepatitis C  . Tobacco abuse  . Anemia  . Unspecified constipation  . Ileus, postoperative    Continue to allow pancreatitis to resolve before attempting to fee.  12 Days Post-Op    LOS: 17 days   Matt B. Daphine Deutscher, MD, [facility] [phone] beeper [phone]  10/05/2012 10:23 AM

## 2012-10-06 LAB — CBC
HCT: 30.4 % — ABNORMAL LOW (ref 36.0–46.0)
Hemoglobin: 9.5 g/dL — ABNORMAL LOW (ref 12.0–15.0)
MCH: 27.5 pg (ref 26.0–34.0)
MCHC: 31.3 g/dL (ref 30.0–36.0)

## 2012-10-06 LAB — BASIC METABOLIC PANEL
BUN: 10 mg/dL (ref 6–23)
CO2: 29 mEq/L (ref 19–32)
Chloride: 100 mEq/L (ref 96–112)
GFR calc Af Amer: >90 mL/min (ref >90)
Glucose, Bld: 73 mg/dL (ref 70–99)
Potassium: 4.4 mEq/L (ref 3.5–5.1)

## 2012-10-06 MED ORDER — HYDROMORPHONE HCL PF 1 MG/ML IJ SOLN
1.0000 mg | Freq: Once | INTRAMUSCULAR | Status: AC
  Administered 2012-10-06: 1 mg via INTRAVENOUS
  Filled 2012-10-06: qty 1

## 2012-10-06 NOTE — Progress Notes (Signed)
[facility] PROGRESS NOTE  Ruth Stephenson JXB:[id number] DOB: 09/22/1950 DOA: 09/18/2012 PCP: No primary provider on file.  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 Johna Sheriff. Postop ileus resolving. Patient stated had a small bowel movement yesterday. Patient with less abdominal distention but more lower abdominal pain. Wound site with some draining ascites. Iv LASIX D/C SECONDARY TO PANCREATITIS. ON ALDACTONE.Continue local wound care with soap and water. Dressing as needed. Per general surgery. Continue pain management. Follow up with general surgery and oncology as outpatient.  #2 severe abdominal pain  likely secondary to acute pancreatitis and constipation. Lipase levels elevated but trending back down. Will hydrate gently. CT of the abdomen and pelvis with massive stool. KUB in am. Follow.  #3 acute pancreatitis Hydrate gently with IV fluids. Bowel rest. Supportive care. Pain management. Follow.  #4 low-grade fever Stable. Afebrile x72 hours. Per ID monitor off antibiotics.  #5 resolving ileus/constipation Patient with small bowel movement yesterday. Patient given another enema yesterday. Patient with significant stool on CT scan. KUB in AM.  #6 ascites/anasarca/lower extremity edema Likely secondary to low albumin state and perioperative fluids. Continue Aldactone.  Monitor closely.  #7 HIV CD4 count of 150. Viral load 41,785. Continue Stribild and Bactrim daily. ID following and appreciate input and recommendations.  #8 history of hepatitis C  likely need to followup with ID as outpatient.  #9 acute blood loss anemia versus anemia of chronic disease Status post 2 units packed red blood cells and FFP perioperatively. Follow H&H. Transfusion threshold hemoglobin less than 7.  #10 hyponatremia Improvement. Follow.  Code Status:  Full Family Communication: Updated patient no family present Disposition Plan: Home when medically stable versus SNF   Consultants:  Oncology: Dr. Myna Hidalgo 09/21/2012  General surgery: Dr. Abbey Chatters of 09/20/2012  Gastroenterology: Dr. Elnoria Howard 09/19/2012  Infectious diseases: Dr. Ninetta Lights 09/19/2012  Procedures:  Colonoscopy: 09/20/2012 per Dr. Elnoria Howard  Open right hemi-colectomy with anastomosis and laparoscopy Dr. Johna Sheriff  09/23/2012  2 units packed red blood cells and 2 units FFP 09/23/2012  CT abd pelvis 10/04/12  Antibiotics:  Bactrim 09/19/2012 for prophylaxis  HPI/Subjective: Patient c/o abdominal pain in lower abdomen and back pain. Patient stated had BM yesterday after enema.   Objective: Filed Vitals:   10/05/12 2200 10/06/12 0600 10/06/12 1347 10/06/12 1430  BP: 124/63 103/47 95/58 111/62  Pulse: 100 79 76 85  Temp: 98.4 F (36.9 C) 98.1 F (36.7 C) 98.5 F (36.9 C)   TempSrc: Oral Oral Oral   Resp: 20 18 18    Height:      Weight:  67 kg (147 lb 11.3 oz)    SpO2: 95% 98% 99%     Intake/Output Summary (Last 24 hours) at 10/06/12 1706 Last data filed at 10/06/12 1030  Gross per 24 hour  Intake 1758.33 ml  Output      0 ml  Net 1758.33 ml   Filed Weights   10/04/12 0500 10/05/12 0600 10/06/12 0600  Weight: 75.7 kg (166 lb 14.2 oz) 65.318 kg (144 lb) 67 kg (147 lb 11.3 oz)    Exam:   General:  NAD  Cardiovascular: RRR  Respiratory: CTAB  Abdomen: Less Distended, positive bowel sounds, tenderness to palpation in epigastrium and diffusely  in the lower quadrants, staples in place.  Extremities: No clubbing no cyanosis. 1+ bilateral lower extremity edema.  Data Reviewed: Basic Metabolic Panel:  Recent Labs Lab 09/30/12 [phone]  10/02/12 0450 10/03/12 0605 10/04/12 0524 10/05/12 0535 10/06/12 0415  NA 133* 132* 132* 131* 132* 134*  K 3.5 3.5 3.9 4.1 4.3 4.4  CL 96 94* 93* 92* 97 100  CO2 32 34* 35* 35* 31 29  GLUCOSE 108* 155* 128* 155*  91 73  BUN 5* 8 9 9 8 10   CREATININE 0.58 0.66 0.73 0.70 0.66 0.69  CALCIUM 8.2* 8.3* 8.5 8.9 8.8 8.4  MG  --  1.6 1.9  --  1.9  --    Liver Function Tests:  Recent Labs Lab 10/02/12 0450 10/03/12 0605  AST 40* 35  ALT 18 18  ALKPHOS 88 86  BILITOT 1.0 0.8  PROT 6.9 7.3  ALBUMIN 1.9* 1.9*    Recent Labs Lab 10/03/12 2047 10/04/12 0524 10/05/12 0535 10/06/12 0415  LIPASE 219* 248* 141* 93*   No results found for this basename: AMMONIA,  in the last 168 hours CBC:  Recent Labs Lab 09/30/12 0615 10/03/12 0605 10/04/12 0524 10/05/12 0535 10/06/12 0415  WBC 6.3 7.9 8.5 8.1 7.9  HGB 10.2* 9.7* 10.7* 10.5* 9.5*  HCT 32.3* 31.1* 34.6* 33.9* 30.4*  MCV 84.1 86.1 86.9 87.4 88.1  PLT 152 187 233 246 212   Cardiac Enzymes:  Recent Labs Lab 10/01/12 1610  TROPONINI <0.30   BNP (last 3 results) No results found for this basename: PROBNP,  in the last 8760 hours CBG: No results found for this basename: GLUCAP,  in the last 168 hours  Recent Results (from the past 240 hour(s))  URINE CULTURE     Status: None   Collection Time    09/30/12  6:00 AM      Result Value Range Status   Specimen Description URINE, CLEAN CATCH   Final   Special Requests NONE   Final   Culture  Setup Time 09/30/2012 08:46   Final   Colony Count 80,000 COLONIES/ML   Final   Culture     Final   Value: VANCOMYCIN RESISTANT ENTEROCOCCUS ISOLATED     Note: CRITICAL RESULT CALLED TO, READ BACK BY AND VERIFIED WITH: DANA MCCLAIN @ 0007 ON 04/11   Report Status 10/04/2012 FINAL   Final   Organism ID, Bacteria VANCOMYCIN RESISTANT ENTEROCOCCUS ISOLATED   Final     Studies: No results found.  Scheduled Meds: . docusate sodium  100 mg Oral BID  . elvitegravir-cobicistat-emtricitabine-tenofovir  1 tablet Oral Q breakfast  . feeding supplement  237 mL Oral TID BM  . furosemide  20 mg Intravenous Once  . metoCLOPramide  5 mg Oral Q12H  . OxyCODONE  10 mg Oral Q12H  . pantoprazole (PROTONIX)  IV  40 mg Intravenous Q24H  . polyethylene glycol  17 g Oral BID  . senna  1 tablet Oral BID  . sodium chloride  10-40 mL Intracatheter Q12H  . sodium chloride  10-40 mL Intracatheter Q12H  . spironolactone  25 mg Oral Daily  . sulfamethoxazole-trimethoprim  1 tablet Oral Daily   Continuous Infusions: . sodium chloride 100 mL/hr at 10/06/12 1307    Principal Problem:   Abdominal pain Active Problems:   Pancreatitis   Colonic mass - cecum   HIV (human immunodeficiency virus infection)   Hepatitis C   Tobacco abuse   Anemia   Unspecified constipation   Ileus, postoperative    Time spent: > 35 mins    [facility]  [facility] Pager [phone]. If 7PM-7AM, please contact night-coverage at [email or web address], password [facility] 10/06/2012, 5:06 PM  LOS: 18 days

## 2012-10-06 NOTE — Progress Notes (Signed)
Patient ID: Ruth Stephenson, female   DOB: 1950/12/03, 62 y.o.   MRN: [id number] [facility] Progress Note:   13 Days Post-Op  Subjective: Mental status is clear.  Having PIC line flushed.  Wants something to drink Objective: Vital signs in last 24 hours: Temp:  [98.1 F (36.7 C)-98.7 F (37.1 C)] 98.1 F (36.7 C) (04/13 0600) Pulse Rate:  [79-100] 79 (04/13 0600) Resp:  [18-20] 18 (04/13 0600) BP: (103-134)/(47-78) 103/47 mmHg (04/13 0600) SpO2:  [95 %-98 %] 98 % (04/13 0600) Weight:  [147 lb 11.3 oz (67 kg)] 147 lb 11.3 oz (67 kg) (04/13 0600)  Intake/Output from previous day: 04/12 0701 - 04/13 0700 In: 2465 [I.V.:2465] Out: -  Intake/Output this shift: Total I/O In: 10 [I.V.:10] Out: -   Physical Exam: Work of breathing is normal.  Abdomen is mildly distended.    Lab Results:  Results for orders placed during the hospital encounter of 09/18/12 (from the past 48 hour(s))  BASIC METABOLIC PANEL     Status: Abnormal   Collection Time    10/05/12  5:35 AM      Result Value Range   Sodium 132 (*) 135 - 145 mEq/L   Potassium 4.3  3.5 - 5.1 mEq/L   Chloride 97  96 - 112 mEq/L   CO2 31  19 - 32 mEq/L   Glucose, Bld 91  70 - 99 mg/dL   BUN 8  6 - 23 mg/dL   Creatinine, Ser 4.09  0.50 - 1.10 mg/dL   Calcium 8.8  8.4 - 81.1 mg/dL   GFR calc non Af Amer >90  >90 mL/min   GFR calc Af Amer >90  >90 mL/min   Comment:            The eGFR has been calculated     using the CKD EPI equation.     This calculation has not been     validated in all clinical     situations.     eGFR's persistently     <90 mL/min signify     possible Chronic Kidney Disease.  CBC     Status: Abnormal   Collection Time    10/05/12  5:35 AM      Result Value Range   WBC 8.1  4.0 - 10.5 K/uL   RBC 3.88  3.87 - 5.11 MIL/uL   Hemoglobin 10.5 (*) 12.0 - 15.0 g/dL   HCT 91.4 (*) 78.2 - 95.6 %   MCV 87.4  78.0 - 100.0 fL   MCH 27.1  26.0 - 34.0 pg   MCHC 31.0  30.0 - 36.0 g/dL   RDW 21.3  (*) 08.6 - 15.5 %   Platelets 246  150 - 400 K/uL  MAGNESIUM     Status: None   Collection Time    10/05/12  5:35 AM      Result Value Range   Magnesium 1.9  1.5 - 2.5 mg/dL  LIPASE, BLOOD     Status: Abnormal   Collection Time    10/05/12  5:35 AM      Result Value Range   Lipase 141 (*) 11 - 59 U/L  URINALYSIS, ROUTINE W REFLEX MICROSCOPIC     Status: Abnormal   Collection Time    10/05/12  9:21 PM      Result Value Range   Color, Urine AMBER (*) YELLOW   Comment: BIOCHEMICALS MAY BE AFFECTED BY COLOR   APPearance CLEAR  CLEAR  Specific Gravity, Urine 1.029  1.005 - 1.030   pH 6.0  5.0 - 8.0   Glucose, UA NEGATIVE  NEGATIVE mg/dL   Hgb urine dipstick NEGATIVE  NEGATIVE   Bilirubin Urine SMALL (*) NEGATIVE   Ketones, ur NEGATIVE  NEGATIVE mg/dL   Protein, ur NEGATIVE  NEGATIVE mg/dL   Urobilinogen, UA 0.2  0.0 - 1.0 mg/dL   Nitrite NEGATIVE  NEGATIVE   Leukocytes, UA TRACE (*) NEGATIVE  URINE MICROSCOPIC-ADD ON     Status: Abnormal   Collection Time    10/05/12  9:21 PM      Result Value Range   Squamous Epithelial / LPF FEW (*) RARE   WBC, UA 0-2  <3 WBC/hpf  BASIC METABOLIC PANEL     Status: Abnormal   Collection Time    10/06/12  4:15 AM      Result Value Range   Sodium 134 (*) 135 - 145 mEq/L   Potassium 4.4  3.5 - 5.1 mEq/L   Chloride 100  96 - 112 mEq/L   CO2 29  19 - 32 mEq/L   Glucose, Bld 73  70 - 99 mg/dL   BUN 10  6 - 23 mg/dL   Creatinine, Ser 1.61  0.50 - 1.10 mg/dL   Calcium 8.4  8.4 - 09.6 mg/dL   GFR calc non Af Amer >90  >90 mL/min   GFR calc Af Amer >90  >90 mL/min   Comment:            The eGFR has been calculated     using the CKD EPI equation.     This calculation has not been     validated in all clinical     situations.     eGFR's persistently     <90 mL/min signify     possible Chronic Kidney Disease.  CBC     Status: Abnormal   Collection Time    10/06/12  4:15 AM      Result Value Range   WBC 7.9  4.0 - 10.5 K/uL   RBC 3.45  (*) 3.87 - 5.11 MIL/uL   Hemoglobin 9.5 (*) 12.0 - 15.0 g/dL   HCT 04.5 (*) 40.9 - 81.1 %   MCV 88.1  78.0 - 100.0 fL   MCH 27.5  26.0 - 34.0 pg   MCHC 31.3  30.0 - 36.0 g/dL   RDW 91.4 (*) 78.2 - 95.6 %   Platelets 212  150 - 400 K/uL    Radiology/Results: Ct Abdomen Pelvis W Contrast  10/04/2012  *RADIOLOGY REPORT*  Clinical Data: Diffuse abdominal pain.  History of partial colectomy for colon mass.  Hepatitis C.  CT ABDOMEN AND PELVIS WITH CONTRAST  Technique:  Multidetector CT imaging of the abdomen and pelvis was performed following the standard protocol during bolus administration of intravenous contrast.  Contrast: OMNIPAQUE IOHEXOL 300 MG/ML  SOLN  Comparison: CT abdomen and pelvis 09/19/2012.  Findings: There is some dependent atelectasis in the lung bases.  The liver demonstrates a markedly nodular border and appears shrunken consistent with cirrhosis.  There is a small volume of ascites.  As seen on the prior study, there is splenomegaly. Changes of portal hypertension are noted with recanalization of the umbilical vein and perigastric and splenic varices.  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  No focal liver lesion is identified.  The adrenal glands, pancreas and right kidney are unremarkable. There is a tiny left renal cyst, unchanged.  Postoperative change of  right hemicolectomy is noted with a surgical seen in the right upper quadrant.  There is a massive volume of stool in the remaining ascending colon and throughout the transverse colon.  Walls of the colon from the splenic flexure through the rectum appear thickened although these structures are incompletely distended.  The stomach and small bowel are unremarkable.  Multiple small retroperitoneal lymph nodes are unchanged.  IMPRESSION:  1.  Status post partial right hemicolectomy with a massive volume stool in the remaining ascending and transverse colon.  Wall thickening of the colon from the splenic flexure through the rectum may be  due to underdistension but inflammatory process could create a similar appearance. 2.  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   Original Report Authenticated By: Holley Dexter, M.D.     Anti-infectives: Anti-infectives   Start     Dose/Rate Route Frequency Ordered Stop   09/30/12 1000  fluconazole (DIFLUCAN) tablet 150 mg     150 mg Oral  Once 09/30/12 0857 09/30/12 0939   09/24/12 1000  elvitegravir-cobicistat-emtricitabine-tenofovir (STRIBILD) 150-150-200-300 MG tablet 1 tablet     1 tablet Oral Daily with breakfast 09/24/12 0826     09/20/12 1400  piperacillin-tazobactam (ZOSYN) IVPB 3.375 g  Status:  Discontinued     3.375 g 12.5 mL/hr over 240 Minutes Intravenous 3 times per day 09/20/12 1331 09/24/12 1407   09/19/12 1600  sulfamethoxazole-trimethoprim (BACTRIM,SEPTRA) 400-80 MG per tablet 1 tablet     1 tablet Oral Daily 09/19/12 1524     09/19/12 1200  cefoTAXime (CLAFORAN) 1 g in dextrose 5 % 50 mL IVPB  Status:  Discontinued     1 g 100 mL/hr over 30 Minutes Intravenous 4 times per day 09/19/12 0912 09/20/12 1323   09/19/12 1000  metroNIDAZOLE (FLAGYL) tablet 500 mg  Status:  Discontinued     500 mg Oral 3 times per day 09/19/12 0906 09/20/12 1323   09/19/12 1000  ciprofloxacin (CIPRO) IVPB 400 mg  Status:  Discontinued     400 mg 200 mL/hr over 60 Minutes Intravenous Every 12 hours 09/19/12 0912 09/19/12 0935   09/19/12 0800  elvitegravir-cobicistat-emtricitabine-tenofovir (STRIBILD) 150-150-200-300 MG tablet 1 tablet  Status:  Discontinued     1 tablet Oral Daily with breakfast 09/19/12 0512 09/23/12 1601   09/19/12 0600  cefTRIAXone (ROCEPHIN) 1 g in dextrose 5 % 50 mL IVPB  Status:  Discontinued     1 g 100 mL/hr over 30 Minutes Intravenous Every 24 hours 09/19/12 0512 09/19/12 2956      Assessment/Plan: Problem List: Patient Active Problem List  Diagnosis  . Abdominal pain  . Pancreatitis  . Colonic mass -  cecum  . HIV (human immunodeficiency virus infection)  . Hepatitis C  . Tobacco abuse  . Anemia  . Unspecified constipation  . Ileus, postoperative    Will offer clear liquids 13 Days Post-Op    LOS: 18 days   Matt B. Daphine Deutscher, MD, [facility] [phone] beeper [phone]  10/06/2012 9:02 AM

## 2012-10-07 ENCOUNTER — Inpatient Hospital Stay (HOSPITAL_COMMUNITY): Payer: Medicaid - Out of State

## 2012-10-07 LAB — BASIC METABOLIC PANEL
Calcium: 7.7 mg/dL — ABNORMAL LOW (ref 8.4–10.5)
Creatinine, Ser: 0.63 mg/dL (ref 0.50–1.10)
GFR calc Af Amer: >90 mL/min (ref >90)

## 2012-10-07 LAB — CBC
HCT: 28.4 % — ABNORMAL LOW (ref 36.0–46.0)
Hemoglobin: 8.8 g/dL — ABNORMAL LOW (ref 12.0–15.0)
MCH: 27.3 pg (ref 26.0–34.0)
MCHC: 31 g/dL (ref 30.0–36.0)
MCV: 88.2 fL (ref 78.0–100.0)
Platelets: 175 K/uL (ref 150–400)
RBC: 3.22 MIL/uL — ABNORMAL LOW (ref 3.87–5.11)
RDW: 23.7 % — ABNORMAL HIGH (ref 11.5–15.5)
WBC: 5.6 K/uL (ref 4.0–10.5)

## 2012-10-07 MED ORDER — OXYCODONE HCL 5 MG PO TABS
5.0000 mg | ORAL_TABLET | ORAL | Status: DC | PRN
  Administered 2012-10-07: 10 mg via ORAL
  Administered 2012-10-07 – 2012-10-08 (×3): 15 mg via ORAL
  Filled 2012-10-07 (×2): qty 3
  Filled 2012-10-07: qty 2
  Filled 2012-10-07 (×2): qty 3

## 2012-10-07 MED ORDER — HYDROMORPHONE HCL PF 1 MG/ML IJ SOLN
1.0000 mg | INTRAMUSCULAR | Status: DC | PRN
  Administered 2012-10-07 – 2012-10-09 (×8): 1 mg via INTRAVENOUS
  Filled 2012-10-07 (×8): qty 1

## 2012-10-07 MED ORDER — SORBITOL 70 % SOLN
960.0000 mL | TOPICAL_OIL | Freq: Once | ORAL | Status: DC
  Filled 2012-10-07: qty 240

## 2012-10-07 NOTE — Progress Notes (Signed)
[facility] PROGRESS NOTE  Ruth Stephenson NWG:[id number] DOB: 11-05-1950 DOA: 09/18/2012 PCP: No primary provider on file.  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 Johna Sheriff. Postop ileus resolving. Patient stated had a large bowel movement this morning. Patient with less abdominal distention and less lower abdominal pain. Wound site with some draining ascites. Iv LASIX D/C SECONDARY TO PANCREATITIS. D/C ALDACTONE.Continue local wound care with soap and water. Dressing as needed. Per general surgery. Continue pain management. Follow up with general surgery and oncology as outpatient.  #2 severe abdominal pain  likely secondary to acute pancreatitis and constipation. Clinical improvement. Lipase levels elevated but trending back down. Will hydrate gently. CT of the abdomen and pelvis with massive stool. Patient states large bowel movement this morning and yesterday. Follow. Supportive care.   #3 acute pancreatitis Hydrate gently with IV fluids. Patient started on clear liquids and will advance as tolerated. Supportive care. Pain management. Follow.  #4 low-grade fever Stable. Afebrile. Per ID monitor off antibiotics.  #5 resolving ileus/constipation Patient with large bowel movement yesterday and this morning.  Patient with significant stool on CT scan. Monitor.   #6 ascites/anasarca/lower extremity edema Likely secondary to low albumin state and perioperative fluids. D/C Aldactone.  Monitor closely.  #7 HIV CD4 count of 150. Viral load 41,785. Continue Stribild and Bactrim daily. ID following and appreciate input and recommendations.  #8 history of hepatitis C  likely need to followup with ID as outpatient.  #9 acute blood loss anemia versus anemia of chronic disease Status post 2 units packed red blood cells and FFP perioperatively. Follow H&H.  Transfusion threshold hemoglobin less than 7.  #10 hyponatremia Improvement. Follow.  Code Status: Full Family Communication: Updated patient no family present Disposition Plan: Home when medically stable versus SNF   Consultants:  Oncology: Dr. Myna Hidalgo 09/21/2012  General surgery: Dr. Abbey Chatters of 09/20/2012  Gastroenterology: Dr. Elnoria Howard 09/19/2012  Infectious diseases: Dr. Ninetta Lights 09/19/2012  Procedures:  Colonoscopy: 09/20/2012 per Dr. Elnoria Howard  Open right hemi-colectomy with anastomosis and laparoscopy Dr. Johna Sheriff  09/23/2012  2 units packed red blood cells and 2 units FFP 09/23/2012  CT abd pelvis 10/04/12  KUB 10/07/2012  Antibiotics:  Bactrim 09/19/2012 for prophylaxis  HPI/Subjective: Patient states abdominal pain in lower abdomen improved. Patient states back pain improving. Patient stated had large BM yesterday and this morning.   Objective: Filed Vitals:   10/06/12 1430 10/06/12 2145 10/07/12 0500 10/07/12 0651  BP: 111/62 103/63  100/54  Pulse: 85 85  86  Temp:  98.8 F (37.1 C)  98.1 F (36.7 C)  TempSrc:  Oral  Oral  Resp:  17  18  Height:      Weight:   68.493 kg (151 lb)   SpO2:    98%    Intake/Output Summary (Last 24 hours) at 10/07/12 0936 Last data filed at 10/07/12 0500  Gross per 24 hour  Intake   2605 ml  Output      0 ml  Net   2605 ml   Filed Weights   10/05/12 0600 10/06/12 0600 10/07/12 0500  Weight: 65.318 kg (144 lb) 67 kg (147 lb 11.3 oz) 68.493 kg (151 lb)    Exam:   General:  NAD  Cardiovascular: RRR  Respiratory: CTAB  Abdomen: Less Distended, positive bowel sounds, decreased TTP  in the lower quadrants, staples in place.  Extremities: No clubbing no cyanosis. 1+  bilateral lower extremity edema.  Data Reviewed: Basic Metabolic Panel:  Recent Labs Lab 10/02/12 0450 10/03/12 0605 10/04/12 0524 10/05/12 0535 10/06/12 0415 10/07/12 0415  NA 132* 132* 131* 132* 134* 135  K 3.5 3.9 4.1 4.3 4.4 3.9  CL  94* 93* 92* 97 100 104  CO2 34* 35* 35* 31 29 28   GLUCOSE 155* 128* 155* 91 73 92  BUN 8 9 9 8 10 6   CREATININE 0.66 0.73 0.70 0.66 0.69 0.63  CALCIUM 8.3* 8.5 8.9 8.8 8.4 7.7*  MG 1.6 1.9  --  1.9  --   --    Liver Function Tests:  Recent Labs Lab 10/02/12 0450 10/03/12 0605  AST 40* 35  ALT 18 18  ALKPHOS 88 86  BILITOT 1.0 0.8  PROT 6.9 7.3  ALBUMIN 1.9* 1.9*    Recent Labs Lab 10/03/12 2047 10/04/12 0524 10/05/12 0535 10/06/12 0415  LIPASE 219* 248* 141* 93*   No results found for this basename: AMMONIA,  in the last 168 hours CBC:  Recent Labs Lab 10/03/12 0605 10/04/12 0524 10/05/12 0535 10/06/12 0415 10/07/12 0415  WBC 7.9 8.5 8.1 7.9 5.6  HGB 9.7* 10.7* 10.5* 9.5* 8.8*  HCT 31.1* 34.6* 33.9* 30.4* 28.4*  MCV 86.1 86.9 87.4 88.1 88.2  PLT 187 233 246 212 175   Cardiac Enzymes:  Recent Labs Lab 10/01/12 1610  TROPONINI <0.30   BNP (last 3 results) No results found for this basename: PROBNP,  in the last 8760 hours CBG: No results found for this basename: GLUCAP,  in the last 168 hours  Recent Results (from the past 240 hour(s))  URINE CULTURE     Status: None   Collection Time    09/30/12  6:00 AM      Result Value Range Status   Specimen Description URINE, CLEAN CATCH   Final   Special Requests NONE   Final   Culture  Setup Time 09/30/2012 08:46   Final   Colony Count 80,000 COLONIES/ML   Final   Culture     Final   Value: VANCOMYCIN RESISTANT ENTEROCOCCUS ISOLATED     Note: CRITICAL RESULT CALLED TO, READ BACK BY AND VERIFIED WITH: DANA MCCLAIN @ 0007 ON 04/11   Report Status 10/04/2012 FINAL   Final   Organism ID, Bacteria VANCOMYCIN RESISTANT ENTEROCOCCUS ISOLATED   Final  URINE CULTURE     Status: None   Collection Time    10/05/12  9:21 PM      Result Value Range Status   Specimen Description URINE, CLEAN CATCH   Final   Special Requests NONE   Final   Culture  Setup Time 10/06/2012 02:05   Final   Colony Count 15,000  COLONIES/ML   Final   Culture ESCHERICHIA COLI   Final   Report Status PENDING   Incomplete     Studies: Dg Abd 1 View  10/07/2012  *RADIOLOGY REPORT*  Clinical Data: The abdominal distention  ABDOMEN - 1 VIEW  Comparison: CT abdomen pelvis of 10/04/2012  Findings: Supine views of the abdomen show only minimal prominence of small bowel in the left abdomen.  No definite bowel obstruction or ileus is seen.  Some residual contrast is noted in what appears to be the left colon in this patient who has undergone right hemicolectomy.  Surgical staples overlie the right abdomen and mid pelvis.  IMPRESSION: No bowel obstruction.  No definite ileus postoperatively. Minimally prominent small bowel in the left abdomen.  Original Report Authenticated By: Dwyane Dee, M.D.     Scheduled Meds: . docusate sodium  100 mg Oral BID  . elvitegravir-cobicistat-emtricitabine-tenofovir  1 tablet Oral Q breakfast  . feeding supplement  237 mL Oral TID BM  . furosemide  20 mg Intravenous Once  . metoCLOPramide  5 mg Oral Q12H  . pantoprazole (PROTONIX) IV  40 mg Intravenous Q24H  . polyethylene glycol  17 g Oral BID  . senna  1 tablet Oral BID  . sodium chloride  10-40 mL Intracatheter Q12H  . sodium chloride  10-40 mL Intracatheter Q12H  . sulfamethoxazole-trimethoprim  1 tablet Oral Daily   Continuous Infusions: . sodium chloride 100 mL/hr at 10/07/12 1610    Principal Problem:   Abdominal pain Active Problems:   Pancreatitis   Colonic mass - cecum   HIV (human immunodeficiency virus infection)   Hepatitis C   Tobacco abuse   Anemia   Unspecified constipation   Ileus, postoperative    Time spent: > 35 mins    [facility]  [facility] Pager [phone]. If 7PM-7AM, please contact night-coverage at [email or web address], password [facility] 10/07/2012, 9:36 AM  LOS: 19 days

## 2012-10-07 NOTE — Progress Notes (Addendum)
Patient ID: Ruth Stephenson, female   DOB: 1951-03-28, 62 y.o.   MRN: [id number]         [facility]    Date of Admission:  09/18/2012     Subjective: She is feeling much better. She says that her abdominal pain is now manageable. She is hungry and wants to advance her diet.  Objective: Temp:  [98.1 F (36.7 C)-98.8 F (37.1 C)] 98.1 F (36.7 C) (04/14 0651) Pulse Rate:  [76-86] 86 (04/14 0651) Resp:  [17-18] 18 (04/14 0651) BP: (95-111)/(54-63) 100/54 mmHg (04/14 0651) SpO2:  [98 %-99 %] 98 % (04/14 0651) Weight:  [68.493 kg (151 lb)] 68.493 kg (151 lb) (04/14 0500)  General: She is smiling in good spirits Skin: No rash Lungs: Clear Cor: Regular S1 and S2 with no murmurs Abdomen: Soft and nontender. She has some soft tissue edema across her lower abdomen and groin with mild erythema. It is not warm or tender. Her incisions look good.  Studies/Results: Dg Abd 1 View  10/07/2012  *RADIOLOGY REPORT*  Clinical Data: The abdominal distention  ABDOMEN - 1 VIEW  Comparison: CT abdomen pelvis of 10/04/2012  Findings: Supine views of the abdomen show only minimal prominence of small bowel in the left abdomen.  No definite bowel obstruction or ileus is seen.  Some residual contrast is noted in what appears to be the left colon in this patient who has undergone right hemicolectomy.  Surgical staples overlie the right abdomen and mid pelvis.  IMPRESSION: No bowel obstruction.  No definite ileus postoperatively. Minimally prominent small bowel in the left abdomen.   Original Report Authenticated By: Dwyane Dee, M.D.     Assessment: Slow improvement in her postoperative abdominal pain. I think the erythema of her lower abdomen is related to dependent edema and not cellulitis.  Plan: 1. Continue Stribild and trimethoprim sulfamethoxazole  Cliffton Asters, MD [facility] [phone] pager   [phone] cell 10/07/2012,  8:40 AM

## 2012-10-07 NOTE — Progress Notes (Signed)
14 Days Post-Op  Subjective: Pt doing much better today.  Pain from pancreatitis only mild in back, no abdominal pain.  Incision sites healing well, open drain site just lateral to RUQ incision with serous drainage.  Pt is very hungry and thirsty, would like to advance diet.  Pt having multiple BM's.  Pt ambulating better today that pain is improved.  Objective: Vital signs in last 24 hours: Temp:  [98.1 F (36.7 C)-98.8 F (37.1 C)] 98.1 F (36.7 C) (04/14 0651) Pulse Rate:  [76-86] 86 (04/14 0651) Resp:  [17-18] 18 (04/14 0651) BP: (95-111)/(54-63) 100/54 mmHg (04/14 0651) SpO2:  [98 %-99 %] 98 % (04/14 0651) Weight:  [151 lb (68.493 kg)] 151 lb (68.493 kg) (04/14 0500) Last BM Date: 10/07/12  Intake/Output from previous day: 04/13 0701 - 04/14 0700 In: 2615 [P.O.:360; I.V.:2255] Out: -  Intake/Output this shift:    PE: Gen:  Alert, NAD, pleasant Abd: Soft, NT/ND,  Staples in place with incisions sites appearing well healed except for RUQ open incision has a hole where a drain site was which is currently open and draining minimal serous fluid, +BS   Lab Results:   Recent Labs  10/06/12 0415 10/07/12 0415  WBC 7.9 5.6  HGB 9.5* 8.8*  HCT 30.4* 28.4*  PLT 212 175   BMET  Recent Labs  10/06/12 0415 10/07/12 0415  NA 134* 135  K 4.4 3.9  CL 100 104  CO2 29 28  GLUCOSE 73 92  BUN 10 6  CREATININE 0.69 0.63  CALCIUM 8.4 7.7*   PT/INR No results found for this basename: LABPROT, INR,  in the last 72 hours CMP     Component Value Date/Time   NA 135 10/07/2012 0415   K 3.9 10/07/2012 0415   CL 104 10/07/2012 0415   CO2 28 10/07/2012 0415   GLUCOSE 92 10/07/2012 0415   BUN 6 10/07/2012 0415   CREATININE 0.63 10/07/2012 0415   CALCIUM 7.7* 10/07/2012 0415   PROT 7.3 10/03/2012 0605   ALBUMIN 1.9* 10/03/2012 0605   AST 35 10/03/2012 0605   ALT 18 10/03/2012 0605   ALKPHOS 86 10/03/2012 0605   BILITOT 0.8 10/03/2012 0605   GFRNONAA >90 10/07/2012 0415   GFRAA >90  10/07/2012 0415   Lipase     Component Value Date/Time   LIPASE 93* 10/06/2012 0415       Studies/Results: Dg Abd 1 View  10/07/2012  *RADIOLOGY REPORT*  Clinical Data: The abdominal distention  ABDOMEN - 1 VIEW  Comparison: CT abdomen pelvis of 10/04/2012  Findings: Supine views of the abdomen show only minimal prominence of small bowel in the left abdomen.  No definite bowel obstruction or ileus is seen.  Some residual contrast is noted in what appears to be the left colon in this patient who has undergone right hemicolectomy.  Surgical staples overlie the right abdomen and mid pelvis.  IMPRESSION: No bowel obstruction.  No definite ileus postoperatively. Minimally prominent small bowel in the left abdomen.   Original Report Authenticated By: Dwyane Dee, M.D.     Anti-infectives: Anti-infectives   Start     Dose/Rate Route Frequency Ordered Stop   09/30/12 1000  fluconazole (DIFLUCAN) tablet 150 mg     150 mg Oral  Once 09/30/12 0857 09/30/12 0939   09/24/12 1000  elvitegravir-cobicistat-emtricitabine-tenofovir (STRIBILD) 150-150-200-300 MG tablet 1 tablet     1 tablet Oral Daily with breakfast 09/24/12 0826     09/20/12 1400  piperacillin-tazobactam (ZOSYN) IVPB  3.375 g  Status:  Discontinued     3.375 g 12.5 mL/hr over 240 Minutes Intravenous 3 times per day 09/20/12 1331 09/24/12 1407   09/19/12 1600  sulfamethoxazole-trimethoprim (BACTRIM,SEPTRA) 400-80 MG per tablet 1 tablet     1 tablet Oral Daily 09/19/12 1524     09/19/12 1200  cefoTAXime (CLAFORAN) 1 g in dextrose 5 % 50 mL IVPB  Status:  Discontinued     1 g 100 mL/hr over 30 Minutes Intravenous 4 times per day 09/19/12 0912 09/20/12 1323   09/19/12 1000  metroNIDAZOLE (FLAGYL) tablet 500 mg  Status:  Discontinued     500 mg Oral 3 times per day 09/19/12 0906 09/20/12 1323   09/19/12 1000  ciprofloxacin (CIPRO) IVPB 400 mg  Status:  Discontinued     400 mg 200 mL/hr over 60 Minutes Intravenous Every 12 hours 09/19/12 0912  09/19/12 0935   09/19/12 0800  elvitegravir-cobicistat-emtricitabine-tenofovir (STRIBILD) 150-150-200-300 MG tablet 1 tablet  Status:  Discontinued     1 tablet Oral Daily with breakfast 09/19/12 0512 09/23/12 1601   09/19/12 0600  cefTRIAXone (ROCEPHIN) 1 g in dextrose 5 % 50 mL IVPB  Status:  Discontinued     1 g 100 mL/hr over 30 Minutes Intravenous Every 24 hours 09/19/12 0512 09/19/12 0906       Assessment/Plan Obstructing carcinoma of right colon Stage IIA adenocarcinoma 1.  POD #14 Status post laparoscopy and open right hemicolectomy with anastomosis 09/23/2012 by Dr. Johna Sheriff.  2.  Postop ileus resolving. Patient stated had multiple large BM's over last few days on SMOG enemas.   3.  Pain much improved overnight 4.  Wound site with some draining serous fluid - Continue local wound care with soap and water. Dressing as needed. Remove staples today. 5.  Advance diet to fulls at lunch, soft diet at dinner, PO pain meds 6.  If meeting all goals may be able to go home tomorrow  Acute Pancreatitis - improving Ileus/constipation - resolving HIV/Hep C Anemia - ABL vs chronic disease hgb 8.8 today down a bit from yesterday    LOS: 19 days    DORT, MEGAN 10/07/2012, 9:14 AM Pager: [phone]

## 2012-10-08 LAB — BASIC METABOLIC PANEL
Chloride: 103 mEq/L (ref 96–112)
GFR calc Af Amer: >90 mL/min (ref >90)
Potassium: 3.9 mEq/L (ref 3.5–5.1)
Sodium: 135 mEq/L (ref 135–145)

## 2012-10-08 LAB — LIPASE, BLOOD: Lipase: 188 U/L — ABNORMAL HIGH (ref 11–59)

## 2012-10-08 MED ORDER — LIDOCAINE 4 % EX CREA
TOPICAL_CREAM | Freq: Four times a day (QID) | CUTANEOUS | Status: DC | PRN
  Administered 2012-10-09: 1 via TOPICAL
  Filled 2012-10-08: qty 5

## 2012-10-08 MED ORDER — LIDOCAINE-HYDROCORTISONE ACE 3-0.5 % RE CREA
1.0000 | TOPICAL_CREAM | Freq: Four times a day (QID) | RECTAL | Status: DC | PRN
  Filled 2012-10-08: qty 7

## 2012-10-08 MED ORDER — FLEET ENEMA 7-19 GM/118ML RE ENEM: 1.0000 | ENEMA | Freq: Once | RECTAL | Status: DC

## 2012-10-08 MED ORDER — MORPHINE SULFATE 15 MG PO TABS
15.0000 mg | ORAL_TABLET | ORAL | Status: DC | PRN
  Administered 2012-10-08 (×2): 15 mg via ORAL
  Administered 2012-10-08 – 2012-10-09 (×5): 30 mg via ORAL
  Filled 2012-10-08 (×2): qty 2
  Filled 2012-10-08: qty 1
  Filled 2012-10-08 (×3): qty 2
  Filled 2012-10-08: qty 1

## 2012-10-08 MED ORDER — LIDOCAINE-HYDROCORTISONE ACE 3-0.5 % EX CREA: 1.0000 "application " | TOPICAL_CREAM | Freq: Four times a day (QID) | CUTANEOUS | Status: DC | PRN

## 2012-10-08 NOTE — Progress Notes (Signed)
15 Days Post-Op  Subjective: Pt was crying upon entering the room saying her pain in her back is very bad.  She had to be given dilaudid IV.  She likes to walk, but the pain in her back has been so severe she feels like she'll pass out if she walks very far.  PT has been working with her.  Pt states she thinks she's withdrawaling from pain medication.  She notes she was given methadone in the past for withdrawal from medication which worked better for her.    Objective: Vital signs in last 24 hours: Temp:  [98.1 F (36.7 C)-98.6 F (37 C)] 98.1 F (36.7 C) (04/15 1610) Pulse Rate:  [78-87] 78 (04/15 0613) Resp:  [18] 18 (04/15 0613) BP: (102-121)/(56-76) 102/56 mmHg (04/15 0613) SpO2:  [99 %-100 %] 99 % (04/15 0613) Weight:  [152 lb 8.9 oz (69.2 kg)] 152 lb 8.9 oz (69.2 kg) (04/15 1058) Last BM Date: 10/08/12  Intake/Output from previous day: 04/14 0701 - 04/15 0700 In: 120 [P.O.:120] Out: -  Intake/Output this shift:    PE: Gen:  Alert, NAD, pleasant Abd: Soft, still distended, but improved, NT, +BS, no HSM, incisions C/D/I, drain site is still draining serous fluid, now that staples are out wound closures look good   Lab Results:   Recent Labs  10/06/12 0415 10/07/12 0415  WBC 7.9 5.6  HGB 9.5* 8.8*  HCT 30.4* 28.4*  PLT 212 175   BMET  Recent Labs  10/07/12 0415 10/08/12 0535  NA 135 135  K 3.9 3.9  CL 104 103  CO2 28 27  GLUCOSE 92 107*  BUN 6 5*  CREATININE 0.63 0.58  CALCIUM 7.7* 8.1*   PT/INR No results found for this basename: LABPROT, INR,  in the last 72 hours CMP     Component Value Date/Time   NA 135 10/08/2012 0535   K 3.9 10/08/2012 0535   CL 103 10/08/2012 0535   CO2 27 10/08/2012 0535   GLUCOSE 107* 10/08/2012 0535   BUN 5* 10/08/2012 0535   CREATININE 0.58 10/08/2012 0535   CALCIUM 8.1* 10/08/2012 0535   PROT 7.3 10/03/2012 0605   ALBUMIN 1.9* 10/03/2012 0605   AST 35 10/03/2012 0605   ALT 18 10/03/2012 0605   ALKPHOS 86 10/03/2012 0605   BILITOT 0.8 10/03/2012 0605   GFRNONAA >90 10/08/2012 0535   GFRAA >90 10/08/2012 0535   Lipase     Component Value Date/Time   LIPASE 188* 10/08/2012 0535       Studies/Results: Dg Abd 1 View  10/07/2012  *RADIOLOGY REPORT*  Clinical Data: The abdominal distention  ABDOMEN - 1 VIEW  Comparison: CT abdomen pelvis of 10/04/2012  Findings: Supine views of the abdomen show only minimal prominence of small bowel in the left abdomen.  No definite bowel obstruction or ileus is seen.  Some residual contrast is noted in what appears to be the left colon in this patient who has undergone right hemicolectomy.  Surgical staples overlie the right abdomen and mid pelvis.  IMPRESSION: No bowel obstruction.  No definite ileus postoperatively. Minimally prominent small bowel in the left abdomen.   Original Report Authenticated By: Dwyane Dee, M.D.     Anti-infectives: Anti-infectives   Start     Dose/Rate Route Frequency Ordered Stop   09/30/12 1000  fluconazole (DIFLUCAN) tablet 150 mg     150 mg Oral  Once 09/30/12 0857 09/30/12 0939   09/24/12 1000  elvitegravir-cobicistat-emtricitabine-tenofovir (STRIBILD) 150-150-200-300 MG tablet  1 tablet     1 tablet Oral Daily with breakfast 09/24/12 0826     09/20/12 1400  piperacillin-tazobactam (ZOSYN) IVPB 3.375 g  Status:  Discontinued     3.375 g 12.5 mL/hr over 240 Minutes Intravenous 3 times per day 09/20/12 1331 09/24/12 1407   09/19/12 1600  sulfamethoxazole-trimethoprim (BACTRIM,SEPTRA) 400-80 MG per tablet 1 tablet     1 tablet Oral Daily 09/19/12 1524     09/19/12 1200  cefoTAXime (CLAFORAN) 1 g in dextrose 5 % 50 mL IVPB  Status:  Discontinued     1 g 100 mL/hr over 30 Minutes Intravenous 4 times per day 09/19/12 0912 09/20/12 1323   09/19/12 1000  metroNIDAZOLE (FLAGYL) tablet 500 mg  Status:  Discontinued     500 mg Oral 3 times per day 09/19/12 0906 09/20/12 1323   09/19/12 1000  ciprofloxacin (CIPRO) IVPB 400 mg  Status:  Discontinued      400 mg 200 mL/hr over 60 Minutes Intravenous Every 12 hours 09/19/12 0912 09/19/12 0935   09/19/12 0800  elvitegravir-cobicistat-emtricitabine-tenofovir (STRIBILD) 150-150-200-300 MG tablet 1 tablet  Status:  Discontinued     1 tablet Oral Daily with breakfast 09/19/12 0512 09/23/12 1601   09/19/12 0600  cefTRIAXone (ROCEPHIN) 1 g in dextrose 5 % 50 mL IVPB  Status:  Discontinued     1 g 100 mL/hr over 30 Minutes Intravenous Every 24 hours 09/19/12 0512 09/19/12 0906       Assessment/Plan 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 POD #15 Status post laparoscopy and open right hemicolectomy with anastomosis 09/23/2012 by Dr. Johna Sheriff.  2. Post-op ileus resolving 3. Pain in abdomen is resolved, but pain in back still present 4. Wound site with some draining serous fluid - Continue local wound care with soap and water. Dressing as needed. Remove staples yesterday. 5. On reg diet as tolerated, could back off if better tolerated 6. Pt is meeting all surgical goals and could likely go home from our perspective 7. Pain mgt - pt may be difficult to send home today due to pain issues, recommend long acting pain medication, muscle relaxer's or lidoderm?  Acute Pancreatitis - slight bump in lipase today, may be due to starting reg diet Ileus/constipation - resolving  HIV/Hep C  Anemia - ABL vs chronic disease hgb 8.8 today down a bit from yesterday     LOS: 20 days    DORT, Jervon Ream 10/08/2012, 11:55 AM Pager: [phone]

## 2012-10-08 NOTE — Progress Notes (Signed)
[facility] PROGRESS NOTE  Ruth Stephenson ZOX:[id number] DOB: 10-09-1950 DOA: 09/18/2012 PCP: No primary provider on file.  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 Johna Sheriff. Postop ileus resolving. Patient stated had a large bowel movement this morning. Patient with less abdominal distention and less lower abdominal pain. Wound site with some draining ascites. Iv LASIX D/C SECONDARY TO PANCREATITIS. D/C'd ALDACTONE.Continue local wound care with soap and water. Dressing as needed. Per general surgery. Continue pain management. Follow up with general surgery and oncology as outpatient.  #2 severe abdominal pain  likely secondary to acute pancreatitis and constipation. Clinical improvement. Lipase levels elevated but trending back down. Continue gentle hydration. CT of the abdomen and pelvis with massive stool. Patient states  bowel movement this morning and yesterday. Follow. Supportive care.   #3 acute pancreatitis Hydrate gently with IV fluids. Patient started on regular diet. Patient with some pain on oral intake. Supportive care. Pain management. Follow.  #4 low-grade fever Stable. Afebrile. Per ID monitor off antibiotics.  #5 resolving ileus/constipation Patient with large bowel movement yesterday and today.  Patient with significant stool on CT scan. Monitor.   #6 ascites/anasarca/lower extremity edema Likely secondary to low albumin state and perioperative fluids. resume Aldactone in 1-2 days.  Monitor closely.  #7 HIV CD4 count of 150. Viral load 41,785. Continue Stribild and Bactrim daily. ID following and appreciate input and recommendations.  #8 history of hepatitis C  likely need to followup with ID as outpatient.  #9 acute blood loss anemia versus anemia of chronic disease Status post 2 units packed red blood cells and FFP  perioperatively. Follow H&H. Transfusion threshold hemoglobin less than 7.  #10 hyponatremia Improved. Follow.  Code Status: Full Family Communication: Updated patient no family present Disposition Plan: Home when medically stable versus SNF   Consultants:  Oncology: Dr. Myna Hidalgo 09/21/2012  General surgery: Dr. Abbey Chatters of 09/20/2012  Gastroenterology: Dr. Elnoria Howard 09/19/2012  Infectious diseases: Dr. Ninetta Lights 09/19/2012  Procedures:  Colonoscopy: 09/20/2012 per Dr. Elnoria Howard  Open right hemi-colectomy with anastomosis and laparoscopy Dr. Johna Sheriff  09/23/2012  2 units packed red blood cells and 2 units FFP 09/23/2012  CT abd pelvis 10/04/12  KUB 10/07/2012  Antibiotics:  Bactrim 09/19/2012 for prophylaxis  HPI/Subjective: Patient states abdominal pain in lower abdomen improved. Patient c/o back pain.  Objective: Filed Vitals:   10/07/12 2134 10/08/12 0613 10/08/12 1058 10/08/12 1557  BP: 121/76 102/56  134/74  Pulse: 86 78  90  Temp: 98.4 F (36.9 C) 98.1 F (36.7 C)  97.8 F (36.6 C)  TempSrc: Oral Oral  Oral  Resp: 18 18  20   Height:      Weight:   69.2 kg (152 lb 8.9 oz)   SpO2: 99% 99%  100%    Intake/Output Summary (Last 24 hours) at 10/08/12 1905 Last data filed at 10/08/12 1824  Gross per 24 hour  Intake    840 ml  Output      0 ml  Net    840 ml   Filed Weights   10/06/12 0600 10/07/12 0500 10/08/12 1058  Weight: 67 kg (147 lb 11.3 oz) 68.493 kg (151 lb) 69.2 kg (152 lb 8.9 oz)    Exam:   General:  NAD  Cardiovascular: RRR  Respiratory: CTAB  Abdomen: Less Distended, positive bowel sounds, decreased TTP  in the lower quadrants, staples in place.  Extremities: No clubbing no  cyanosis. 1+ bilateral lower extremity edema.  Data Reviewed: Basic Metabolic Panel:  Recent Labs Lab 10/02/12 0450 10/03/12 0605 10/04/12 0524 10/05/12 0535 10/06/12 0415 10/07/12 0415 10/08/12 0535  NA 132* 132* 131* 132* 134* 135 135  K 3.5 3.9 4.1 4.3  4.4 3.9 3.9  CL 94* 93* 92* 97 100 104 103  CO2 34* 35* 35* 31 29 28 27   GLUCOSE 155* 128* 155* 91 73 92 107*  BUN 8 9 9 8 10 6  5*  CREATININE 0.66 0.73 0.70 0.66 0.69 0.63 0.58  CALCIUM 8.3* 8.5 8.9 8.8 8.4 7.7* 8.1*  MG 1.6 1.9  --  1.9  --   --   --    Liver Function Tests:  Recent Labs Lab 10/02/12 0450 10/03/12 0605  AST 40* 35  ALT 18 18  ALKPHOS 88 86  BILITOT 1.0 0.8  PROT 6.9 7.3  ALBUMIN 1.9* 1.9*    Recent Labs Lab 10/03/12 2047 10/04/12 0524 10/05/12 0535 10/06/12 0415 10/08/12 0535  LIPASE 219* 248* 141* 93* 188*   No results found for this basename: AMMONIA,  in the last 168 hours CBC:  Recent Labs Lab 10/03/12 0605 10/04/12 0524 10/05/12 0535 10/06/12 0415 10/07/12 0415  WBC 7.9 8.5 8.1 7.9 5.6  HGB 9.7* 10.7* 10.5* 9.5* 8.8*  HCT 31.1* 34.6* 33.9* 30.4* 28.4*  MCV 86.1 86.9 87.4 88.1 88.2  PLT 187 233 246 212 175   Cardiac Enzymes: No results found for this basename: CKTOTAL, CKMB, CKMBINDEX, TROPONINI,  in the last 168 hours BNP (last 3 results) No results found for this basename: PROBNP,  in the last 8760 hours CBG: No results found for this basename: GLUCAP,  in the last 168 hours  Recent Results (from the past 240 hour(s))  URINE CULTURE     Status: None   Collection Time    09/30/12  6:00 AM      Result Value Range Status   Specimen Description URINE, CLEAN CATCH   Final   Special Requests NONE   Final   Culture  Setup Time 09/30/2012 08:46   Final   Colony Count 80,000 COLONIES/ML   Final   Culture     Final   Value: VANCOMYCIN RESISTANT ENTEROCOCCUS ISOLATED     Note: CRITICAL RESULT CALLED TO, READ BACK BY AND VERIFIED WITH: DANA MCCLAIN @ 0007 ON 04/11   Report Status 10/04/2012 FINAL   Final   Organism ID, Bacteria VANCOMYCIN RESISTANT ENTEROCOCCUS ISOLATED   Final  URINE CULTURE     Status: None   Collection Time    10/05/12  9:21 PM      Result Value Range Status   Specimen Description URINE, CLEAN CATCH   Final    Special Requests NONE   Final   Culture  Setup Time 10/06/2012 02:05   Final   Colony Count 15,000 COLONIES/ML   Final   Culture ESCHERICHIA COLI   Final   Report Status PENDING   Incomplete     Studies: Dg Abd 1 View  10/07/2012  *RADIOLOGY REPORT*  Clinical Data: The abdominal distention  ABDOMEN - 1 VIEW  Comparison: CT abdomen pelvis of 10/04/2012  Findings: Supine views of the abdomen show only minimal prominence of small bowel in the left abdomen.  No definite bowel obstruction or ileus is seen.  Some residual contrast is noted in what appears to be the left colon in this patient who has undergone right hemicolectomy.  Surgical staples overlie the right abdomen  and mid pelvis.  IMPRESSION: No bowel obstruction.  No definite ileus postoperatively. Minimally prominent small bowel in the left abdomen.   Original Report Authenticated By: Dwyane Dee, M.D.     Scheduled Meds: . docusate sodium  100 mg Oral BID  . elvitegravir-cobicistat-emtricitabine-tenofovir  1 tablet Oral Q breakfast  . feeding supplement  237 mL Oral TID BM  . furosemide  20 mg Intravenous Once  . metoCLOPramide  5 mg Oral Q12H  . pantoprazole (PROTONIX) IV  40 mg Intravenous Q24H  . polyethylene glycol  17 g Oral BID  . senna  1 tablet Oral BID  . sodium chloride  10-40 mL Intracatheter Q12H  . sodium chloride  10-40 mL Intracatheter Q12H  . sodium phosphate  1 enema Rectal Once  . sulfamethoxazole-trimethoprim  1 tablet Oral Daily   Continuous Infusions:    Principal Problem:   Abdominal pain Active Problems:   Pancreatitis   Colonic mass - cecum   HIV (human immunodeficiency virus infection)   Hepatitis C   Tobacco abuse   Anemia   Unspecified constipation   Ileus, postoperative    Time spent: > 35 mins    [facility]  [facility] Pager [phone]. If 7PM-7AM, please contact night-coverage at [email or web address], password [facility] 10/08/2012, 7:05 PM  LOS: 20 days

## 2012-10-08 NOTE — Progress Notes (Signed)
Patient ID: Ruth Stephenson, female   DOB: 12-13-1950, 62 y.o.   MRN: [id number]         [facility]    Date of Admission:  09/18/2012     Subjective: She is having more abdominal pain today.  Objective: Temp:  [98.1 F (36.7 C)-98.4 F (36.9 C)] 98.1 F (36.7 C) (04/15 0613) Pulse Rate:  [78-86] 78 (04/15 0613) Resp:  [18] 18 (04/15 0613) BP: (102-121)/(56-76) 102/56 mmHg (04/15 0613) SpO2:  [99 %] 99 % (04/15 0613) Weight:  [69.2 kg (152 lb 8.9 oz)] 69.2 kg (152 lb 8.9 oz) (04/15 1058)  General: She appears more comfortable with abdominal distention and pain  Lab Results Lipase 188  Assessment: Slow recover from postoperative ileus and pancreatitis.  Plan: 1. Arrange ID clinic when she is closer to discharge  Cliffton Asters, MD [facility] Disease [facility] [phone] pager   [phone] cell 10/08/2012, 2:15 PM

## 2012-10-09 ENCOUNTER — Other Ambulatory Visit: Payer: Self-pay | Admitting: Hematology & Oncology

## 2012-10-09 ENCOUNTER — Telehealth (INDEPENDENT_AMBULATORY_CARE_PROVIDER_SITE_OTHER): Payer: Self-pay

## 2012-10-09 ENCOUNTER — Encounter (INDEPENDENT_AMBULATORY_CARE_PROVIDER_SITE_OTHER): Payer: Self-pay

## 2012-10-09 DIAGNOSIS — Z9189 Other specified personal risk factors, not elsewhere classified: Secondary | ICD-10-CM

## 2012-10-09 LAB — CBC
Hemoglobin: 10.2 g/dL — ABNORMAL LOW (ref 12.0–15.0)
MCH: 27.5 pg (ref 26.0–34.0)
MCHC: 31.2 g/dL (ref 30.0–36.0)
MCV: 88.1 fL (ref 78.0–100.0)
Platelets: 149 10*3/uL — ABNORMAL LOW (ref 150–400)
RBC: 3.71 MIL/uL — ABNORMAL LOW (ref 3.87–5.11)

## 2012-10-09 LAB — BASIC METABOLIC PANEL
BUN: 5 mg/dL — ABNORMAL LOW (ref 6–23)
Calcium: 8.7 mg/dL (ref 8.4–10.5)
Chloride: 101 mEq/L (ref 96–112)
GFR calc non Af Amer: >90 mL/min (ref >90)
Glucose, Bld: 106 mg/dL — ABNORMAL HIGH (ref 70–99)
Potassium: 4.1 mEq/L (ref 3.5–5.1)

## 2012-10-09 LAB — LIPASE, BLOOD: Lipase: 191 U/L — ABNORMAL HIGH (ref 11–59)

## 2012-10-09 MED ORDER — SPIRONOLACTONE 25 MG PO TABS
25.0000 mg | ORAL_TABLET | Freq: Every day | ORAL | Status: DC
  Administered 2012-10-09: 25 mg via ORAL
  Filled 2012-10-09: qty 1

## 2012-10-09 MED ORDER — DSS 100 MG PO CAPS: 100.0000 mg | ORAL_CAPSULE | Freq: Two times a day (BID) | ORAL | Status: DC

## 2012-10-09 MED ORDER — SULFAMETHOXAZOLE-TRIMETHOPRIM 400-80 MG PO TABS: 1.0000 | ORAL_TABLET | Freq: Every day | ORAL | Status: DC

## 2012-10-09 MED ORDER — SENNA 8.6 MG PO TABS: 1.0000 | ORAL_TABLET | Freq: Two times a day (BID) | ORAL | Status: DC

## 2012-10-09 MED ORDER — LACTULOSE 10 GM/15ML PO SOLN: 20.0000 g | Freq: Every day | ORAL | Status: DC

## 2012-10-09 MED ORDER — ENSURE COMPLETE PO LIQD: 237.0000 mL | Freq: Three times a day (TID) | ORAL | Status: DC

## 2012-10-09 MED ORDER — SPIRONOLACTONE 25 MG PO TABS: 25.0000 mg | ORAL_TABLET | Freq: Every day | ORAL | Status: DC

## 2012-10-09 MED ORDER — MORPHINE SULFATE 15 MG PO TABS: 15.0000 mg | ORAL_TABLET | ORAL | Status: DC | PRN

## 2012-10-09 MED ORDER — POLYETHYLENE GLYCOL 3350 17 G PO PACK: 17.0000 g | PACK | Freq: Two times a day (BID) | ORAL | Status: DC

## 2012-10-09 MED ORDER — LIDOCAINE-HYDROCORTISONE ACE 3-0.5 % RE CREA: 1.0000 | TOPICAL_CREAM | Freq: Four times a day (QID) | RECTAL | Status: DC | PRN

## 2012-10-09 MED ORDER — PANTOPRAZOLE SODIUM 40 MG PO TBEC
40.0000 mg | DELAYED_RELEASE_TABLET | Freq: Every day | ORAL | Status: DC
  Administered 2012-10-09: 40 mg via ORAL
  Filled 2012-10-09: qty 1

## 2012-10-09 NOTE — Progress Notes (Signed)
Met with pt to discuss need for PCP. I provided her with a list and a phone # to call and assist her in finding one who is accepting new pts and takes her insurance.

## 2012-10-09 NOTE — Progress Notes (Signed)
Patient ID: Ruth Stephenson, female   DOB: 09-19-1950, 62 y.o.   MRN: [id number]         [facility]    Date of Admission:  09/18/2012     Subjective: She is feeling better with decreased abdominal pain. So far she is tolerating solid food in small meals and she is eager to go home. She wants to get off of the pain medications. She is having no problems tolerating her Stribild.  Objective: Temp:  [97.7 F (36.5 C)-98.4 F (36.9 C)] 98.2 F (36.8 C) (04/16 1302) Pulse Rate:  [68-90] 86 (04/16 1302) Resp:  [18-20] 20 (04/16 1302) BP: (106-134)/(62-74) 110/63 mmHg (04/16 1302) SpO2:  [96 %-100 %] 96 % (04/16 1302) Weight:  [68.811 kg (151 lb 11.2 oz)] 68.811 kg (151 lb 11.2 oz) (04/16 0500)  General: She is smiling and up walking in her room Lungs: Clear Cor: Regular S1 and S2 with no murmurs Abdomen: Remains distended but soft and nontender. I did not see any evidence of infection of any of her incisions  Lab Results Lipase 199  Assessment: She is improving slowly and now tolerating solid food. I have done some further investigation and found that in pre-licensure studies of Stribild patients had elevations of amylase and lipase but the frequency of this was no different than other antiretroviral agents and it does not appear that it was felt to be a cause of clinical pancreatitis. Her lipase remains elevated but she has no radiographic evidence of pancreatitis. I favor continuing Stribild and plan on repeating her CD4 count and viral load at the time of her followup in clinic after discharge.  Plan: 1. Continue Stribild and trimethoprim sulfamethoxazole 2. Consider discharge soon  Cliffton Asters, MD [facility] for Infectious Disease [facility] [phone] pager   [phone] cell 10/09/2012, 2:50 PM

## 2012-10-09 NOTE — Discharge Summary (Signed)
Physician Discharge Summary  Ruth Stephenson WUJ:[id number] DOB: October 09, 1950 DOA: 09/18/2012  PCP: No primary provider on file.  Admit date: 09/18/2012 Discharge date: 10/09/2012  Time spent: 70 minutes  Recommendations for Outpatient Follow-up:  1. Follow up with Dr Johna Sheriff in 2 weeks. 2. Follow up with Dr Orvan Falconer of ID in 2 weeks. 3. Patient to pick PCP and follow up in 1-2 weeks. On follow up will need BMET and CBC  Discharge Diagnoses:  Principal Problem:   Abdominal pain Active Problems:   Pancreatitis   Colonic mass - cecum   HIV (human immunodeficiency virus infection)   Hepatitis C   Tobacco abuse   Anemia   Unspecified constipation   Ileus, postoperative   Discharge Condition: Stable and improved.  Diet recommendation: low sodium diet  Filed Weights   10/07/12 0500 10/08/12 1058 10/09/12 0500  Weight: 68.493 kg (151 lb) 69.2 kg (152 lb 8.9 oz) 68.811 kg (151 lb 11.2 oz)    History of present illness:  Ruth Stephenson is a 62 y.o. female history of HIV and hepatitis C has been experiencing right flank pain nausea and vomiting over the last 2 weeks. Her symptoms has been gradually worsening. Patient has been having diarrhea alternating with constipation for last few months. Denies any fever chills. The pain is mostly on the right flank area nonradiating. In the ER labs show that patient has elevated lipase and CT abdomen and pelvis show possibility of pancreatitis and ascending colon mass. Patient at this time is being admitted for further management. Patient states that she has been combine with her HIV medications and she just recently moved from the ER 2 months ago. Patient states that her last HIV viral load was undetectable.   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 GI was consulted and patient was  seen in consultation by Dr.Hung on 09/19/2012. Patient subsequently underwent a colonoscopy on 09/20/2012 and a large ascending colonic mass was noted. Biopsies were done and a general surgical and oncology consultation was obtained. General surgery was consulted and patient was seen in consultation by Dr. Abbey Chatters on 09/20/2012. It was felt patient will need a partial colectomy. Patient was maintained on a full liquid diet and his CEA level was obtained which came back at 2.0. Patient subsequently underwent laparoscopy and open right hemicolectomy with anastomosis 09/23/2012 by Dr. Johna Sheriff. Biopsies which were obtained came back positive for invasive adenocarcinoma. Patient developed a postop ileus and constipation. Patient improved clinically had to receive multiple enemas for her constipation. Patient's pain improved. Patient was placed on diuretics secondary to volume overload. Patient was followed by general surgery throughout the hospitalization. Patient improved clinically her pain was managed and her constipation resolved. Patient will followup with Gen. surgery as outpatient with further referral to oncology for further evaluation and management.  #2 severe abdominal pain    Patient was admitted with severe abdominal pain. It was felt this was likely secondary to acute pancreatitis, abdominal mass and constipation. Patient was monitored and lipase levels which were obtained were elevated. Patient was initially placed on bowel rest and followed. Patient subsequently underwent open right hemicolectomy with anastomosis. She was followed. And subsequently started on clear liquids which was advanced to full liquids. Patient had further complaints of severe abdominal pain and a such abdominal films were done which are consistent  with constipation. Patient was given multiple enemas including small enemas with good bowel movements. Patient continued to have some abdominal pain head pain medications were  adjusted. CT of the abdomen and pelvis was subsequently done which showed massive stool. Patient was given some small enemas with good bowel movements. She was initially on IV Lasix which was discontinued this was gently hydrated. Patient's pain regimen was adjusted and she improved clinically diet was subsequently advanced which he tolerated pain remained under control and by day of discharge patient was in stable and improved condition. Patient was tolerating a diet with minimal requirements for pain medications. Patient be discharged in stable and improved condition.  #3 acute pancreatitis  Colossal admission patient was noted to have presented with acute pancreatitis with elevated lipase levels. Patient was placed on the bowel rest gently hydrated with IV fluids. Patient subsequently underwent a right hemicolectomy for a mass. Her diet was slowly advanced back. Patient tolerated okay for a few days and then developed epigastric pain and back pain. Lipase levels which were repeated showed some elevation. Patient was placed on bowel rest again. Hydrated gently with IV fluids and diuretics discontinued. Patient improved clinically. Patient's diet was started back up and advanced which she tolerated. Patient be discharged in stable and improved condition. #4 low-grade fever  Stable. Per ID monitor off antibiotics.  #5 resolving ileus/constipation   during the hospitalization. Patient did have an ileus and subsequently developed constipation. Patient was mobilized ileus improved she started to pass gas and have bowel movements. Patient was given multiple small enemas enemas with significant good bowel movements. CT scans and abdominal x-rays which were done with consistent with massive stool. Patient via enemas and oral bowel regimen had significant bowel movements would resolution of her constipation.  #6 ascites/anasarca/lower extremity edema   During the hospitalization, patient was noted to have ascites,  anasarca, lower extremity edema. It was felt likely secondary to low albumin state, history of chronic liver disease and perioperative fluids. Patient was initially placed on IV Lasix and Aldactone. Patient had good urine output. IV Lasix was discontinued secondary to recurrent pancreatitis. Aldactone was held for a couple of days. Patient improved clinically. Patient be discharged home on Aldactone. And will need outpatient followup for further management of her chronic liver disease.  #7 HIV  Patient noted to have a history of HIV. CD4 count of 150. Viral load 41,785. Patient was seen in consultation by IV who recommended continuation of patient's HIV medications of  Stribild. Patient was also placed on Bactrim daily for prophylaxis. Patient was seen in consultation by Dr. Orvan Falconer of ID and followed throughout the hospitalization by ID. Patient will followup with ID as outpatient.   #8 history of hepatitis C  Remained stable. Patient will  followup with ID as outpatient.  #9 acute blood loss anemia versus anemia of chronic disease  Patient noted to be anemic during the hospitalization patient was transfused 2 units packed red blood cells and FFP perioperatively. Patient's hemoglobin stabilized. On day of discharge patient's hemoglobin was 10.2.   The rest of patient's chronic medical issues remained stable throughout the hospitalization patient be discharged in stable and improved condition.     Procedures: Colonoscopy: 09/20/2012 per Dr. Elnoria Howard  Open right hemi-colectomy with anastomosis and laparoscopy Dr. Johna Sheriff 09/23/2012  2 units packed red blood cells and 2 units FFP 09/23/2012  CT abd pelvis 10/04/12  KUB 10/07/2012   Consultations: Oncology: Dr. Myna Hidalgo 09/21/2012  General surgery: Dr. Abbey Chatters of  09/20/2012  Gastroenterology: Dr. Elnoria Howard 09/19/2012  Infectious diseases: Dr. Ninetta Lights 09/19/2012   Discharge Exam: Filed Vitals:   10/08/12 2200 10/09/12 0500 10/09/12 0603 10/09/12  1302  BP: 111/62  106/65 110/63  Pulse: 68  85 86  Temp: 98.4 F (36.9 C)  97.7 F (36.5 C) 98.2 F (36.8 C)  TempSrc: Oral  Oral Oral  Resp: 18  18 20   Height:      Weight:  68.811 kg (151 lb 11.2 oz)    SpO2: 98%  98% 96%    General: NAD Cardiovascular: RRR Respiratory: CTAB Abdomen: Soft/ distended/NTTP/+BS Extremitie: No c/c. 2+BLE edema  Discharge Instructions  Discharge Orders   Future Appointments Provider Department Dept Phone   10/24/2012 12:15 PM Mariella Saa, MD [facility], [place] [phone]   Future Orders Complete By Expires     Diet - low sodium heart healthy  As directed     Comments:      1200cc fluid restriction    Discharge instructions  As directed     Comments:      Follow up with General surgery in 2 weeks Follow up in ID, clinic, you will be called with appointment time. Pick a PCP and follow up in 1-2 weeks.    Discharge wound care:  As directed     Comments:      Continue local care with soap and water.    Increase activity slowly  As directed         Medication List    TAKE these medications       DSS 100 MG Caps  Take 100 mg by mouth 2 (two) times daily.     feeding supplement Liqd  Take 237 mLs by mouth 3 (three) times daily between meals.     lactulose 10 GM/15ML solution  Commonly known as:  CHRONULAC  Take 30 mLs (20 g total) by mouth daily. Hold if develops diarrhea.     lidocaine-hydrocortisone 3-0.5 % Crea  Commonly known as:  ANAMANTEL HC  Place 1 Applicatorful rectally 4 (four) times daily as needed (rectal pain).     morphine 15 MG tablet  Commonly known as:  MSIR  Take 1-2 tablets (15-30 mg total) by mouth every 4 (four) hours as needed.     polyethylene glycol packet  Commonly known as:  MIRALAX / GLYCOLAX  Take 17 g by mouth 2 (two) times daily.     senna 8.6 MG Tabs  Commonly known as:  SENOKOT  Take 1 tablet (8.6 mg total) by mouth 2 (two) times daily.     spironolactone 25 MG tablet   Commonly known as:  ALDACTONE  Take 1 tablet (25 mg total) by mouth daily.     STRIBILD 150-150-200-300 MG Tabs  Generic drug:  elvitegravir-cobicistat-emtricitabine-tenofovir  Take 1 tablet by mouth daily with breakfast.     sulfamethoxazole-trimethoprim 400-80 MG per tablet  Commonly known as:  BACTRIM,SEPTRA  Take 1 tablet by mouth daily.           Follow-up Information   Follow up with HOXWORTH,BENJAMIN T, MD. Schedule an appointment as soon as possible for a visit in 2 weeks. (THE OFFICE WILL CALL YOU WITH AN APPT TIME)    Contact information:   [address] [place] [address] [phone]       Follow up with Cliffton Asters, MD. (office will call with appointment time)    Contact information:   [address] [employer]  [address] [place] [address] [phone]       Please follow up. (pt to pick PCP and f/u in 1-2 weeks)        The results of significant diagnostics from this hospitalization (including imaging, microbiology, ancillary and laboratory) are listed below for reference.    Significant Diagnostic Studies: Dg Abd 1 View  10/07/2012  *RADIOLOGY REPORT*  Clinical Data: The abdominal distention  ABDOMEN - 1 VIEW  Comparison: CT abdomen pelvis of 10/04/2012  Findings: Supine views of the abdomen show only minimal prominence of small bowel in the left abdomen.  No definite bowel obstruction or ileus is seen.  Some residual contrast is noted in what appears to be the left colon in this patient who has undergone right hemicolectomy.  Surgical staples overlie the right abdomen and mid pelvis.  IMPRESSION: No bowel obstruction.  No definite ileus postoperatively. Minimally prominent small bowel in the left abdomen.   Original Report Authenticated By: Dwyane Dee, M.D.    Dg Abd 1 View  10/03/2012  *RADIOLOGY REPORT*  Clinical Data: Abdominal pain, distension, constipation  ABDOMEN - 1 VIEW  Comparison: 10/01/2012  Findings: Nonobstructive bowel gas  pattern.  Skin staples overlying the right mid abdomen.  Large volume stool in the colon.  IMPRESSION: Large volume stool in the colon, reflecting constipation.   Original Report Authenticated By: Charline Bills, M.D.    US Abdomen Complete  09/19/2012  *RADIOLOGY REPORT*  Clinical Data:  Right upper quadrant pain.  LIMITED ABDOMINAL ULTRASOUND - RIGHT UPPER QUADRANT  Comparison:  CT today.  Findings:  Gallbladder:  There are no gallstones.  No sonographic Murphy's sign.  Gallbladder wall is mildly thickened at 3.7 mm, with normal below the 3 mm.  This is nonspecific in a patient with HIV and could be due to either the patient's history of hepatitis or HIV.  Common bile duct:  Upper limits of normal for age, 6.5 mm.  Mild intrahepatic biliary ductal dilation is also demonstrated on CT.  Liver:  Cirrhotic.  Small amount of perihepatic ascites.  IMPRESSION: Mild gallbladder wall thickening which is nonspecific in this cirrhotic patient.  This could also be associated with HIV cholangiography.  No gallstones or common duct stone identified. Significantly, no sonographic Murphy's sign.                    Original Report Authenticated By: Andreas Newport, M.D.    Ct Abdomen Pelvis W Contrast  10/04/2012  *RADIOLOGY REPORT*  Clinical Data: Diffuse abdominal pain.  History of partial colectomy for colon mass.  Hepatitis C.  CT ABDOMEN AND PELVIS WITH CONTRAST  Technique:  Multidetector CT imaging of the abdomen and pelvis was performed following the standard protocol during bolus administration of intravenous contrast.  Contrast: OMNIPAQUE IOHEXOL 300 MG/ML  SOLN  Comparison: CT abdomen and pelvis 09/19/2012.  Findings: There is some dependent atelectasis in the lung bases.  The liver demonstrates a markedly nodular border and appears shrunken consistent with cirrhosis.  There is a small volume of ascites.  As seen on the prior study, there is splenomegaly. Changes of portal hypertension are noted with  recanalization of the umbilical vein and perigastric and splenic varices.  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  No focal liver lesion is identified.  The adrenal glands, pancreas and right kidney are unremarkable. There is a tiny left renal cyst, unchanged.  Postoperative change of right hemicolectomy is noted with a surgical seen in the right upper quadrant.  There is a massive volume of stool in the remaining ascending colon and throughout the transverse colon.  Walls of the colon from the splenic flexure through the rectum appear thickened although these structures are incompletely distended.  The stomach and small bowel are unremarkable.  Multiple small retroperitoneal lymph nodes are unchanged.  IMPRESSION:  1.  Status post partial right hemicolectomy with a massive volume stool in the remaining ascending and transverse colon.  Wall thickening of the colon from the splenic flexure through the rectum may be due to underdistension but inflammatory process could create a similar appearance. 2.  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   Original Report Authenticated By: Holley Dexter, M.D.    Ct Abdomen Pelvis W Contrast  09/19/2012  *RADIOLOGY REPORT*  Clinical Data: Abdominal pain.  Right lower quadrant pain.  Pain radiating to the right flank.  History of HIV and hepatitis C.  CT ABDOMEN AND PELVIS WITH CONTRAST  Technique:  Multidetector CT imaging of the abdomen and pelvis was performed following the standard protocol during bolus administration of intravenous contrast.  Contrast: OMNIPAQUE IOHEXOL 300 MG/ML  SOLN  Comparison: None.  Findings: Lung Bases: Dependent atelectasis.  Linear scarring in the right middle lobe adjacent to the heart.  Liver:  Hepatic cirrhosis.  Diffuse micronodular contour.  No focal mass lesion is identified.  Mild intrahepatic biliary ductal dilation.  Spleen:  Splenomegaly compatible with portal venous hypertension.   Gallbladder:  Grossly normal.  No calcified stones.  Common bile duct:  Normal for age.  Pancreas:  Mild prominence of pancreatic duct.  No focal mass lesions.  The pancreas appears full.  Adrenal glands:  Normal.  Kidneys:  Normal enhancement and excretion.  The ureters appear within normal limits.  Sub centimeter left inferior pole renal cyst.  Stomach:  Decompressed.  Numerous upper abdominal lymph nodes are present, which may be associated with HIV, congestive or reactive. Neoplastic nodes are a consideration.  Index node along the lesser curvature of the stomach measures 14 mm x 33 mm.  Small bowel:  No small bowel obstruction.  No definite mural thickening.  Colon:   Ileocecal valve appears normal.  The appendix is not identified.  Abnormal soft tissue is present in the right pericolic gutter.  There is circumferential mural thickening in the proximal ascending colon, with an element of obstruction likely.  This enhancing mass demonstrates several areas of low attenuation. Differential considerations include neoplasm such as primary colon cancer, lymphoma or colitis.  This is focal and restricted to the ascending colon.  There is no involvement of the transverse or descending colon.  Pelvic Genitourinary:  Grossly within normal limits.  Urinary bladder normal.  Portal venous collaterals and prominent hemorrhoidal vein.  Bones:  No AVN.  No aggressive osseous lesions.  Spondylosis.  Vasculature: Portal venous hypertension with collaterals in the upper abdomen.  Retroperitoneal collaterals are present as well. Recanalization of the periumbilical vein. Atherosclerosis of the aorta and iliac vessels.  IMPRESSION: 1.  Hepatic cirrhosis with a small amount of ascites, splenomegaly and portal venous hypertension. 2.  Mass in the ascending colon is suspicious for neoplasm.  Focal colitis considered less likely.  In this patient with HIV, colonic lymphoma is a definite consideration.  Adjacent soft tissue stranding  extending into the right pericolic gutter raises the possibility of peritoneal spread of disease. 3.  Fullness of the pancreas with prominence of pancreatic duct. Findings suggestive of pancreatitis.  4.  Abdominal lymphadenopathy is nonspecific reactive, congestive, associated with HIV, or neoplastic in this patient with colonic mass.  Critical Value/emergent results were called by telephone at the time of interpretation on 09/19/2012 at 0141 hours to Dr. Manus Gunning, Jeannett Senior, who verbally acknowledged these results. We discussed the ultrasound dated been ordered but would likely add little based on the CT findings.   Original Report Authenticated By: Andreas Newport, M.D.    Dg Abd 2 Views  09/25/2012  *RADIOLOGY REPORT*  Clinical Data: Postop right colectomy for colon cancer  ABDOMEN - 2 VIEW  Comparison: CT 09/19/2012  Findings: Postop skin staples are present in the right abdomen. Normal bowel gas pattern.  Negative for ileus or obstruction.  No free air on the upright view.  IMPRESSION: Normal bowel gas pattern following right colectomy.   Original Report Authenticated By: Janeece Riggers, M.D.    Dg Abd Acute W/chest  10/01/2012  *RADIOLOGY REPORT*  Clinical Data: Diffuse abdominal pain  ACUTE ABDOMEN SERIES (ABDOMEN 2 VIEW & CHEST 1 VIEW)  Comparison: 09/28/2012, 04/02/2009  Findings: Artifact overlies the chest and abdomen presumably from a warming blanket.  Left PICC line tip projects at the SVC/RA junction.  Normal heart size and vascularity.  No definite focal pneumonia, edema, collapse, CHF, effusion or pneumothorax.  Slight rotation to the left.  Postop changes in the abdomen.  Retained stool throughout the bowel could represent a degree of constipation.  No free air on the decubitus view.  No abnormal osseous finding.  IMPRESSION: Left PICC line tip SVC RA junction.  No definite acute chest or abdominal process  Possible constipation   Original Report Authenticated By: Judie Petit. Shick, M.D.    Dg Abd Portable  1v  09/28/2012  *RADIOLOGY REPORT*  Clinical Data: Abdominal distention  PORTABLE ABDOMEN - 1 VIEW  Comparison: Plain films 42,014  Findings: Skin staples in the right abdomen.  No pathologically dilated loops of large or small bowel.  There is gas within the colon and small bowel.  Small amount gas within the sigmoid colon.  IMPRESSION:   Findings suggestive of a postoperative ileus. Small amount gas in the sigmoid colon.   Original Report Authenticated By: Genevive Bi, M.D.     Microbiology: Recent Results (from the past 240 hour(s))  URINE CULTURE     Status: None   Collection Time    09/30/12  6:00 AM      Result Value Range Status   Specimen Description URINE, CLEAN CATCH   Final   Special Requests NONE   Final   Culture  Setup Time 09/30/2012 08:46   Final   Colony Count 80,000 COLONIES/ML   Final   Culture     Final   Value: VANCOMYCIN RESISTANT ENTEROCOCCUS ISOLATED     Note: CRITICAL RESULT CALLED TO, READ BACK BY AND VERIFIED WITH: DANA MCCLAIN @ 0007 ON 04/11   Report Status 10/04/2012 FINAL   Final   Organism ID, Bacteria VANCOMYCIN RESISTANT ENTEROCOCCUS ISOLATED   Final  URINE CULTURE     Status: None   Collection Time    10/05/12  9:21 PM      Result Value Range Status   Specimen Description URINE, CLEAN CATCH   Final   Special Requests NONE   Final   Culture  Setup Time 10/06/2012 02:05   Final   Colony Count 15,000 COLONIES/ML   Final   Culture ESCHERICHIA COLI   Final   Report Status PENDING   Incomplete  Labs: Basic Metabolic Panel:  Recent Labs Lab 10/03/12 0605  10/05/12 0535 10/06/12 0415 10/07/12 0415 10/08/12 0535 10/09/12 0500  NA 132*  < > 132* 134* 135 135 134*  K 3.9  < > 4.3 4.4 3.9 3.9 4.1  CL 93*  < > 97 100 104 103 101  CO2 35*  < > 31 29 28 27 27   GLUCOSE 128*  < > 91 73 92 107* 106*  BUN 9  < > 8 10 6  5* 5*  CREATININE 0.73  < > 0.66 0.69 0.63 0.58 0.55  CALCIUM 8.5  < > 8.8 8.4 7.7* 8.1* 8.7  MG 1.9  --  1.9  --   --   --   --    < > = values in this interval not displayed. Liver Function Tests:  Recent Labs Lab 10/03/12 0605  AST 35  ALT 18  ALKPHOS 86  BILITOT 0.8  PROT 7.3  ALBUMIN 1.9*    Recent Labs Lab 10/04/12 0524 10/05/12 0535 10/06/12 0415 10/08/12 0535 10/09/12 0500  LIPASE 248* 141* 93* 188* 191*   No results found for this basename: AMMONIA,  in the last 168 hours CBC:  Recent Labs Lab 10/04/12 0524 10/05/12 0535 10/06/12 0415 10/07/12 0415 10/09/12 0500  WBC 8.5 8.1 7.9 5.6 4.7  HGB 10.7* 10.5* 9.5* 8.8* 10.2*  HCT 34.6* 33.9* 30.4* 28.4* 32.7*  MCV 86.9 87.4 88.1 88.2 88.1  PLT 233 246 212 175 149*   Cardiac Enzymes: No results found for this basename: CKTOTAL, CKMB, CKMBINDEX, TROPONINI,  in the last 168 hours BNP: BNP (last 3 results) No results found for this basename: PROBNP,  in the last 8760 hours CBG: No results found for this basename: GLUCAP,  in the last 168 hours     Signed:  Aaden Buckman  [facility] 10/09/2012, 4:17 PM

## 2012-10-09 NOTE — Progress Notes (Signed)
16 Days Post-Op  Subjective: Pt's back pain is much better controlled now with MS contin.  She is tolerating a regular diet.  Pt feels very good and wants to go home.  Having Bm's passing flatus.  Notices a little redness in LLQ incision site which is mildly tender.    Objective: Vital signs in last 24 hours: Temp:  [97.7 F (36.5 C)-98.4 F (36.9 C)] 97.7 F (36.5 C) (04/16 0603) Pulse Rate:  [68-90] 85 (04/16 0603) Resp:  [18-20] 18 (04/16 0603) BP: (106-134)/(62-74) 106/65 mmHg (04/16 0603) SpO2:  [98 %-100 %] 98 % (04/16 0603) Weight:  [151 lb 11.2 oz (68.811 kg)] 151 lb 11.2 oz (68.811 kg) (04/16 0500) Last BM Date: 10/08/12  Intake/Output from previous day: 04/15 0701 - 04/16 0700 In: 840 [P.O.:840] Out: -  Intake/Output this shift: Total I/O In: 240 [P.O.:240] Out: -   PE: Gen:  Alert, NAD, pleasant Abd: Soft,ND, mild tenderness in LLQ over incision site which is well healed, but small amount of induration and erythema, no fluctuance; +BS, no HSM, right drain site along open incision with serous drainage but healing well, incisions otherwise C/D/I   Lab Results:   Recent Labs  10/07/12 0415 10/09/12 0500  WBC 5.6 4.7  HGB 8.8* 10.2*  HCT 28.4* 32.7*  PLT 175 149*   BMET  Recent Labs  10/08/12 0535 10/09/12 0500  NA 135 134*  K 3.9 4.1  CL 103 101  CO2 27 27  GLUCOSE 107* 106*  BUN 5* 5*  CREATININE 0.58 0.55  CALCIUM 8.1* 8.7   PT/INR No results found for this basename: LABPROT, INR,  in the last 72 hours CMP     Component Value Date/Time   NA 134* 10/09/2012 0500   K 4.1 10/09/2012 0500   CL 101 10/09/2012 0500   CO2 27 10/09/2012 0500   GLUCOSE 106* 10/09/2012 0500   BUN 5* 10/09/2012 0500   CREATININE 0.55 10/09/2012 0500   CALCIUM 8.7 10/09/2012 0500   PROT 7.3 10/03/2012 0605   ALBUMIN 1.9* 10/03/2012 0605   AST 35 10/03/2012 0605   ALT 18 10/03/2012 0605   ALKPHOS 86 10/03/2012 0605   BILITOT 0.8 10/03/2012 0605   GFRNONAA >90 10/09/2012 0500    GFRAA >90 10/09/2012 0500   Lipase     Component Value Date/Time   LIPASE 191* 10/09/2012 0500       Studies/Results: No results found.  Anti-infectives: Anti-infectives   Start     Dose/Rate Route Frequency Ordered Stop   09/30/12 1000  fluconazole (DIFLUCAN) tablet 150 mg     150 mg Oral  Once 09/30/12 0857 09/30/12 0939   09/24/12 1000  elvitegravir-cobicistat-emtricitabine-tenofovir (STRIBILD) 150-150-200-300 MG tablet 1 tablet     1 tablet Oral Daily with breakfast 09/24/12 0826     09/20/12 1400  piperacillin-tazobactam (ZOSYN) IVPB 3.375 g  Status:  Discontinued     3.375 g 12.5 mL/hr over 240 Minutes Intravenous 3 times per day 09/20/12 1331 09/24/12 1407   09/19/12 1600  sulfamethoxazole-trimethoprim (BACTRIM,SEPTRA) 400-80 MG per tablet 1 tablet     1 tablet Oral Daily 09/19/12 1524     09/19/12 1200  cefoTAXime (CLAFORAN) 1 g in dextrose 5 % 50 mL IVPB  Status:  Discontinued     1 g 100 mL/hr over 30 Minutes Intravenous 4 times per day 09/19/12 0912 09/20/12 1323   09/19/12 1000  metroNIDAZOLE (FLAGYL) tablet 500 mg  Status:  Discontinued     500  mg Oral 3 times per day 09/19/12 0906 09/20/12 1323   09/19/12 1000  ciprofloxacin (CIPRO) IVPB 400 mg  Status:  Discontinued     400 mg 200 mL/hr over 60 Minutes Intravenous Every 12 hours 09/19/12 0912 09/19/12 0935   09/19/12 0800  elvitegravir-cobicistat-emtricitabine-tenofovir (STRIBILD) 150-150-200-300 MG tablet 1 tablet  Status:  Discontinued     1 tablet Oral Daily with breakfast 09/19/12 0512 09/23/12 1601   09/19/12 0600  cefTRIAXone (ROCEPHIN) 1 g in dextrose 5 % 50 mL IVPB  Status:  Discontinued     1 g 100 mL/hr over 30 Minutes Intravenous Every 24 hours 09/19/12 0512 09/19/12 0906       Assessment/Plan 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 POD #16 Status post laparoscopy and open right hemicolectomy with anastomosis 09/23/2012 by Dr. Johna Sheriff.  2. Post-op ileus resolving  3.  Pain in abdomen is resolved, but pain in back still present  4. RUQ wound site with some draining serous fluid - Continue local wound care with soap and water. Dressing as needed. Remove staples yesterday.  5. On reg diet  6. Pt is meeting all surgical goals and could likely go home from our perspective  7. Pain mgt - much better controlled today 8. F/u with Dr. Johna Sheriff in 2 weeks, office will call with date/time 9. LLQ abdominal healed incision induration - apply heat pack to it and talked with Dr. Daphine Deutscher about starting Augmentin for 7 days for possible infection  Acute Pancreatitis -  lipase increased today Ileus/constipation - resolved HIV/Hep C  Anemia - ABL vs chronic disease hgb 10.8 today down a bit from yesterday     LOS: 21 days    DORT, Tereka Thorley 10/09/2012, 12:45 PM Pager: [phone]

## 2012-10-09 NOTE — Telephone Encounter (Signed)
Called patient with post op appointment date & time for Thursday 10/24/12 @ 12:15 pm w/Dr. Johna Sheriff.

## 2012-10-09 NOTE — Progress Notes (Signed)
Discharge instructions provided to patient by student nurse.  Asked patient if she had any questions or concerns.  Pt states she will read over more while waiting for ride and if questions or concerns arise we will review more

## 2012-10-09 NOTE — Progress Notes (Signed)
Upper abdominal horizontal incision was approximated with the exception of a pinhole at the right lateral end. The drainage was about the size of a dime and was pale yellow in color with no odor. There were no signs of infection visible and no pain when palpated. Incision was cleansed and pinhole was covered with 4x4 dry gauze. Both lower incisions were cleansed and covered with 4x4 dry gauze. There was no drainage visible.

## 2012-10-10 ENCOUNTER — Telehealth (HOSPITAL_COMMUNITY): Payer: Self-pay | Admitting: Emergency Medicine

## 2012-10-10 ENCOUNTER — Telehealth: Payer: Self-pay | Admitting: Hematology & Oncology

## 2012-10-10 LAB — URINE CULTURE

## 2012-10-10 NOTE — Telephone Encounter (Signed)
Pt aware of 5-8 appointment °

## 2012-10-10 NOTE — ED Notes (Signed)
[facility] calling to give critical on a urine cx.  Informed lab pt was admitted by hospitalist and call transferred to there office.

## 2012-10-15 ENCOUNTER — Ambulatory Visit (INDEPENDENT_AMBULATORY_CARE_PROVIDER_SITE_OTHER): Payer: Medicaid - Out of State | Admitting: Internal Medicine

## 2012-10-15 ENCOUNTER — Other Ambulatory Visit: Payer: Self-pay | Admitting: *Deleted

## 2012-10-15 ENCOUNTER — Encounter: Payer: Self-pay | Admitting: Internal Medicine

## 2012-10-15 ENCOUNTER — Ambulatory Visit: Payer: Medicaid - Out of State

## 2012-10-15 VITALS — BP 101/62 | HR 74 | Temp 98.9°F | Ht 63.5 in | Wt 140.0 lb

## 2012-10-15 DIAGNOSIS — B20 Human immunodeficiency virus [HIV] disease: Secondary | ICD-10-CM

## 2012-10-15 DIAGNOSIS — R197 Diarrhea, unspecified: Secondary | ICD-10-CM | POA: Insufficient documentation

## 2012-10-15 DIAGNOSIS — R112 Nausea with vomiting, unspecified: Secondary | ICD-10-CM | POA: Insufficient documentation

## 2012-10-15 MED ORDER — PROMETHAZINE HCL 25 MG PO TABS: 25.0000 mg | ORAL_TABLET | Freq: Four times a day (QID) | ORAL | Status: DC | PRN

## 2012-10-15 MED ORDER — ELVITEG-COBIC-EMTRICIT-TENOFDF 150-150-200-300 MG PO TABS: 1.0000 | ORAL_TABLET | Freq: Every day | ORAL | Status: DC

## 2012-10-15 MED ORDER — MORPHINE SULFATE 15 MG PO TABS: 15.0000 mg | ORAL_TABLET | ORAL | Status: DC | PRN

## 2012-10-15 NOTE — Progress Notes (Signed)
Patient ID: Ruth Stephenson, female   DOB: 1950-10-21, 62 y.o.   MRN: [id number]          [facility] for Infectious Disease  Patient Active Problem List  Diagnosis  . Abdominal pain  . Pancreatitis  . Colonic mass - cecum  . HIV (human immunodeficiency virus infection)  . Hepatitis C  . Tobacco abuse  . Anemia  . Unspecified constipation  . Ileus, postoperative  . Nausea with vomiting  . Diarrhea    Patient's Medications  New Prescriptions   PROMETHAZINE (PHENERGAN) 25 MG TABLET    Take 1 tablet (25 mg total) by mouth every 6 (six) hours as needed for nausea.  Previous Medications   DOCUSATE SODIUM 100 MG CAPS    Take 100 mg by mouth 2 (two) times daily.   ELVITEGRAVIR-COBICISTAT-EMTRICITABINE-TENOFOVIR (STRIBILD) 150-150-200-300 MG TABS    Take 1 tablet by mouth daily with breakfast.   FEEDING SUPPLEMENT (ENSURE COMPLETE) LIQD    Take 237 mLs by mouth 3 (three) times daily between meals.   LACTULOSE (CHRONULAC) 10 GM/15ML SOLUTION    Take 30 mLs (20 g total) by mouth daily. Hold if develops diarrhea.   LIDOCAINE-HYDROCORTISONE (ANAMANTEL HC) 3-0.5 % CREA    Place 1 Applicatorful rectally 4 (four) times daily as needed (rectal pain).   POLYETHYLENE GLYCOL (MIRALAX / GLYCOLAX) PACKET    Take 17 g by mouth 2 (two) times daily.   SENNA (SENOKOT) 8.6 MG TABS    Take 1 tablet (8.6 mg total) by mouth 2 (two) times daily.   SPIRONOLACTONE (ALDACTONE) 25 MG TABLET    Take 1 tablet (25 mg total) by mouth daily.   SULFAMETHOXAZOLE-TRIMETHOPRIM (BACTRIM,SEPTRA) 400-80 MG PER TABLET    Take 1 tablet by mouth daily.  Modified Medications   Modified Medication Previous Medication   MORPHINE (MSIR) 15 MG TABLET morphine (MSIR) 15 MG tablet      Take 1-2 tablets (15-30 mg total) by mouth every 4 (four) hours as needed.    Take 1-2 tablets (15-30 mg total) by mouth every 4 (four) hours as needed.  Discontinued Medications   No medications on file    Subjective: Ruth Stephenson is in for her  hospital followup visit. She has been living with HIV infection since the early 1980s and moved here from [place] several months ago. She had been taking Stribild and was told that her viral load was undetectable but ran out of Stribild when she lost her [place]. She was hospitalized and found to have colon cancer and underwent partial colectomy. She had a very slow postoperative recovery with significant postoperative ileus and pain. Is also concerned that she might have had pancreatitis although there was no radiographic evidence of pancreatitis. She was discharged on April 16. She had an old supply of some trimethoprim sulfamethoxazole which he has been taking for pneumocystis prophylaxis. She also had some Aldactone. The only medication she was able to fill after discharge was her immediate release morphine. She was given 30 tablets and ran out of that 2 days ago. She states that she still having severe, intermittent abdominal pain associated with nausea, vomiting and intermittent diarrhea. She states that her pain and nausea get worse when she eats but that she remains very hungry. She has not had any fever, chills or sweats. She says that she is extremely worried about her cancer and finds himself thinking about dying. She's had several close family members die of cancer and she is worried  that that will happen to her. She is also very anxious about being off of Stribild currently.  Objective: Temp: 98.9 F (37.2 C) (04/22 1105) Temp src: Oral (04/22 1105) BP: 101/62 mmHg (04/22 1105) Pulse Rate: 74 (04/22 1105)  General: She is very anxious and worried and is tearful throughout the exam Skin: No rash Lungs: Clear Cor: Regular S1 and S2 no murmurs Abdomen: Less distended. She has some moderate epigastric tenderness with palpation. There is still a small pinhole on the lateral side of her right upper quadrant incision. There is no drainage or surrounding cellulitis. All other incisions  are healing nicely. I do not hear any bowel sounds.   Lab Results HIV 1 RNA Quant (copies/mL)  Date Value  09/19/2012 41785*     CD4 T Cell Abs (cmm)  Date Value  09/19/2012 150*     Assessment: Not surprisingly, her HIV infection reactivated when she was off of Stribild. She completed her paperwork for [facility] approval while she was in the hospital. I will have our financial counselor update her on the status of that application today. Have agreed to give her some Phenergan and refill her prescription for morphine, #30 tablets, for use until she can see Dr. Johna Sheriff on May 1 for surgical followup. She is scheduled to see Dr. Myna Hidalgo at the cancer center on May 8.  Plan: 1. Financial review today to try to get her back on Stribild as soon as possible 2. Refill morphine sulfate immediate release 15 mg, #30, to be taken one to 2 every 4 hours as needed for severe pain 3. Phenergan 25 mg as needed for nausea 4. She declines a referral for mental health counseling 5. Follow up here on May 1   Cliffton Asters, MD [facility] for Infectious Disease [facility] [phone] pager   [phone] cell 10/15/2012, 11:34 AM

## 2012-10-21 ENCOUNTER — Telehealth: Payer: Self-pay | Admitting: Hematology & Oncology

## 2012-10-21 NOTE — Telephone Encounter (Signed)
Tamika from referring called cx 5-8 appointment said pt wants to go to [facility] that this location is to far to drive. Tamika has number and will call [facility] to schedule appointment

## 2012-10-22 ENCOUNTER — Telehealth: Payer: Self-pay | Admitting: *Deleted

## 2012-10-22 NOTE — Telephone Encounter (Signed)
Patient called asking for a refill on her Morphine 15mg  rx.  On 10/15/12 visit, patient was given 30 with the instructions to take 1-2 Q 4hrs for severe pain. She follows up Thursday, 10/24/12 with you and her surgeon.  Please advise if you would refill this for her before her follow up appointment. Andree Coss, RN

## 2012-10-23 ENCOUNTER — Telehealth: Payer: Self-pay | Admitting: *Deleted

## 2012-10-23 NOTE — Telephone Encounter (Signed)
Patient called in to see if we had gotten a response to her request for refill on her Morphine. Advised her not yet that since she has an appt tomorrow with both Dr Orvan Falconer and Dr Johna Sheriff that she may need to discuss her need for refill at one of those visits. She was not happy but she advised she wait until then.

## 2012-10-24 ENCOUNTER — Telehealth: Payer: Self-pay | Admitting: Oncology

## 2012-10-24 ENCOUNTER — Ambulatory Visit (INDEPENDENT_AMBULATORY_CARE_PROVIDER_SITE_OTHER): Payer: Self-pay | Admitting: General Surgery

## 2012-10-24 ENCOUNTER — Encounter (INDEPENDENT_AMBULATORY_CARE_PROVIDER_SITE_OTHER): Payer: Self-pay | Admitting: General Surgery

## 2012-10-24 ENCOUNTER — Ambulatory Visit: Payer: Medicaid - Out of State

## 2012-10-24 ENCOUNTER — Telehealth: Payer: Self-pay

## 2012-10-24 ENCOUNTER — Telehealth: Payer: Self-pay | Admitting: *Deleted

## 2012-10-24 ENCOUNTER — Ambulatory Visit (INDEPENDENT_AMBULATORY_CARE_PROVIDER_SITE_OTHER): Payer: Medicaid - Out of State | Admitting: Internal Medicine

## 2012-10-24 ENCOUNTER — Encounter: Payer: Self-pay | Admitting: Internal Medicine

## 2012-10-24 ENCOUNTER — Inpatient Hospital Stay: Payer: Medicaid - Out of State | Admitting: Internal Medicine

## 2012-10-24 VITALS — BP 119/78 | HR 71 | Temp 98.3°F | Ht 63.5 in | Wt 144.0 lb

## 2012-10-24 VITALS — BP 116/60 | HR 68 | Temp 98.2°F | Resp 14 | Ht 63.5 in | Wt 139.6 lb

## 2012-10-24 DIAGNOSIS — B20 Human immunodeficiency virus [HIV] disease: Secondary | ICD-10-CM

## 2012-10-24 DIAGNOSIS — C182 Malignant neoplasm of ascending colon: Secondary | ICD-10-CM

## 2012-10-24 DIAGNOSIS — Z09 Encounter for follow-up examination after completed treatment for conditions other than malignant neoplasm: Secondary | ICD-10-CM | POA: Insufficient documentation

## 2012-10-24 DIAGNOSIS — Z21 Asymptomatic human immunodeficiency virus [HIV] infection status: Secondary | ICD-10-CM

## 2012-10-24 HISTORY — DX: Malignant neoplasm of ascending colon: C18.2

## 2012-10-24 MED ORDER — SULFAMETHOXAZOLE-TRIMETHOPRIM 400-80 MG PO TABS: 1.0000 | ORAL_TABLET | Freq: Every day | ORAL | Status: DC

## 2012-10-24 NOTE — Patient Instructions (Addendum)
Cancer center will be in touch with you regarding your appointment.

## 2012-10-24 NOTE — Telephone Encounter (Signed)
PT SCHEDULED PER GINA. °WELCOME PACKET MAILED.  °

## 2012-10-24 NOTE — Progress Notes (Signed)
Patient ID: Ruth Stephenson, female   DOB: 1951/04/04, 62 y.o.   MRN: [id number]          [facility] for Infectious Disease  Patient Active Problem List   Diagnosis Date Noted  . Nausea with vomiting 10/15/2012  . Diarrhea 10/15/2012  . Unspecified constipation 10/03/2012  . Ileus, postoperative 10/03/2012  . Abdominal pain 09/19/2012  . Pancreatitis 09/19/2012  . Colonic mass - cecum 09/19/2012  . HIV (human immunodeficiency virus infection) 09/19/2012  . Hepatitis C 09/19/2012  . Tobacco abuse 09/19/2012  . Anemia 09/19/2012    Patient's Medications  New Prescriptions   No medications on file  Previous Medications   DOCUSATE SODIUM 100 MG CAPS    Take 100 mg by mouth 2 (two) times daily.   ELVITEGRAVIR-COBICISTAT-EMTRICITABINE-TENOFOVIR (STRIBILD) 150-150-200-300 MG TABS    Take 1 tablet by mouth daily with breakfast.   FEEDING SUPPLEMENT (ENSURE COMPLETE) LIQD    Take 237 mLs by mouth 3 (three) times daily between meals.   LACTULOSE (CHRONULAC) 10 GM/15ML SOLUTION    Take 30 mLs (20 g total) by mouth daily. Hold if develops diarrhea.   LIDOCAINE-HYDROCORTISONE (ANAMANTEL HC) 3-0.5 % CREA    Place 1 Applicatorful rectally 4 (four) times daily as needed (rectal pain).   MORPHINE (MSIR) 15 MG TABLET    Take 1-2 tablets (15-30 mg total) by mouth every 4 (four) hours as needed.   POLYETHYLENE GLYCOL (MIRALAX / GLYCOLAX) PACKET    Take 17 g by mouth 2 (two) times daily.   PROMETHAZINE (PHENERGAN) 25 MG TABLET    Take 1 tablet (25 mg total) by mouth every 6 (six) hours as needed for nausea.   SPIRONOLACTONE (ALDACTONE) 25 MG TABLET    Take 1 tablet (25 mg total) by mouth daily.  Modified Medications   Modified Medication Previous Medication   SULFAMETHOXAZOLE-TRIMETHOPRIM (BACTRIM,SEPTRA) 400-80 MG PER TABLET sulfamethoxazole-trimethoprim (BACTRIM,SEPTRA) 400-80 MG per tablet      Take 1 tablet by mouth daily.    Take 1 tablet by mouth daily.  Discontinued Medications   SENNA  (SENOKOT) 8.6 MG TABS    Take 1 tablet (8.6 mg total) by mouth 2 (two) times daily.    Subjective: Ruth Stephenson is in for her routine followup. Her [facility] has not been approved yet but she was able to get a one-month supply of Stribild and has been taking it faithfully. Unfortunately she did not know to get her trimethoprim sulfamethoxazole at the drugstore and has not been taking that for pneumocystis prophylaxis. She is feeling better with less abdominal pain. She is off of her morphine now. She is still taking MiraLax and Dulcolax and does having frequent loose stools.  Objective: Temp: 98.3 F (36.8 C) (05/01 0955) Temp src: Oral (05/01 0955) BP: 119/78 mmHg (05/01 0955) Pulse Rate: 71 (05/01 0955)  General: She looks much more comfortable Skin: No rash Lungs: Clear Cor: Regular S1 and S2 no murmurs Abdomen: Still distended but soft and all incisions are healing nicely without any drainage  Lab Results HIV 1 RNA Quant (copies/mL)  Date Value  09/19/2012 41785*     CD4 T Cell Abs (cmm)  Date Value  09/19/2012 150*     Assessment: I expect her HIV to come under much better control now that she is back on Stribild. I will continue her current regimen, have her restart trimethoprim sulfamethoxazole and see her after repeat lab work in one month.  She was unaware that she had  followup with her general surgeon, Dr. Toy Cookey, later today. We've given her instructions about how to get there she believes she can make that appointment. She was initially given a followup visit to see Dr. Myna Hidalgo, her oncologist, and [facility] but does not have reliable transportation to get there. We will call the cancer Center and see if her appointment can be changed to our local cancer Center.  Plan: 1. Continue Stribild 2. Restart trimethoprim sulfamethoxazole 3. Followup here after blood work in one month   Cliffton Asters, MD [facility] for Infectious Disease [facility] [phone] pager   [phone] cell 10/24/2012, 10:21 AM

## 2012-10-24 NOTE — Telephone Encounter (Signed)
Patient came in to see Dr, but did not bring her SSI disabilty letters to apply for adap/rw, since she has out of state medicaid - has emergency month stribild for medications, but will need to do her application soon before it runs out.

## 2012-10-24 NOTE — Progress Notes (Signed)
History: Patient returns for her first office visit now 4 weeks following urgent right hemicolectomy. She presented with a very large partially obstructing cancer at the hepatic flexure. She had some significant postoperative ileus that kept her in the hospital for a couple of weeks but she is now making steady progress. She is having frequent loose stools. She is still on MiraLAX. She is eating better with less nausea. She has some pain around her right upper quadrant incision that is gradually improving.  Exam: BP 116/60  Pulse 68  Temp(Src) 98.2 F (36.8 C) (Temporal)  Resp 14  Ht 5' 3.5" (1.613 m)  Wt 139 lb 9.6 oz (63.322 kg)  BMI 24.34 kg/m2 General: Does not appear ill Abdomen: Healing incision right upper quadrant without infection or other complication. Mild appropriate tenderness here. The remainder of her abdomen is soft and nontender.  We have previously discussed her pathology. This showed a 9 cm T3 primary tumor and 30 negative lymph nodes.  Assessment and plan: Doing well following right colectomy without apparent complication. She is being followed in the ID clinic for her HIV status. She has an appointment being arranged at the cancer center. I will see her back in one month. I advised her to stop her MiraLAX.

## 2012-10-25 ENCOUNTER — Telehealth: Payer: Self-pay | Admitting: *Deleted

## 2012-10-25 NOTE — Telephone Encounter (Signed)
RN reviewed pt's hospital notes.  Was seen by SW and Artist.  Pt was given paperwork to complete and take to DSS for Medicaid on 09/30/12.  Approval process after DSS receives completed paperwork is approximately 6 weeks per M. Deatra Canter, [facility].

## 2012-10-25 NOTE — Telephone Encounter (Signed)
Spoke with patient by phone and confirmed appointment with Dr. Truett Perna for 11/05/12.  Contact names and phone numbers were provided.

## 2012-10-31 ENCOUNTER — Other Ambulatory Visit: Payer: Medicaid - Out of State | Admitting: Lab

## 2012-10-31 ENCOUNTER — Ambulatory Visit: Payer: Medicaid - Out of State

## 2012-10-31 ENCOUNTER — Ambulatory Visit: Payer: Medicaid - Out of State | Admitting: Hematology & Oncology

## 2012-11-05 ENCOUNTER — Telehealth: Payer: Self-pay | Admitting: *Deleted

## 2012-11-05 ENCOUNTER — Ambulatory Visit: Payer: Medicaid - Out of State

## 2012-11-05 ENCOUNTER — Ambulatory Visit: Payer: Medicaid - Out of State | Admitting: Oncology

## 2012-11-05 ENCOUNTER — Encounter: Payer: Self-pay | Admitting: Oncology

## 2012-11-05 ENCOUNTER — Other Ambulatory Visit: Payer: Medicaid - Out of State | Admitting: Lab

## 2012-11-05 NOTE — Progress Notes (Signed)
Spoke with pt regarding status of Medicaid.  She was under the impression that someone in the hospital turned in her application to DSS.  I informed her that she had to turn in her paperwork to DSS.  She understood and said she would go down tomorrow to apply.  I also explained the financial assistance program and put an EPP application in the mail today.

## 2012-11-05 NOTE — Telephone Encounter (Signed)
This RN phoned patient when she did not show for appointment today.  Patient stated she called and left a voice message with someone that she was cancelling appointment.  This RN was not made aware of the cancellation.  This RN asked why she cancelled and patient stated "because I do not have my Medicaid card yet".  This RN informed patient that she would not be denied care due to payer status and offered to re-schedule appointment with Dr. Truett Perna.  The patient declined the offer to re-schedule today and stated " I will call you when I am ready to re-schedule".  "I need to get my Medicaid first".  This RN gave the patient contact information and phone number for [facility].  The patient apologized for the late cancellation notice and stated she would call back soon.

## 2012-11-05 NOTE — Telephone Encounter (Signed)
This RN asked Artist to contact patient regarding Medicaid application status and assist if needed.

## 2012-11-17 ENCOUNTER — Encounter (HOSPITAL_COMMUNITY): Payer: Self-pay | Admitting: *Deleted

## 2012-11-17 ENCOUNTER — Emergency Department (HOSPITAL_COMMUNITY): Payer: Medicaid - Out of State

## 2012-11-17 ENCOUNTER — Emergency Department (HOSPITAL_COMMUNITY)
Admission: EM | Admit: 2012-11-17 | Discharge: 2012-11-18 | Disposition: A | Discharge status: Home / Self Care | Payer: Medicaid - Out of State | Attending: Emergency Medicine | Admitting: Emergency Medicine

## 2012-11-17 DIAGNOSIS — G8929 Other chronic pain: Secondary | ICD-10-CM | POA: Insufficient documentation

## 2012-11-17 DIAGNOSIS — R197 Diarrhea, unspecified: Secondary | ICD-10-CM | POA: Insufficient documentation

## 2012-11-17 DIAGNOSIS — R110 Nausea: Secondary | ICD-10-CM | POA: Insufficient documentation

## 2012-11-17 DIAGNOSIS — B182 Chronic viral hepatitis C: Secondary | ICD-10-CM | POA: Insufficient documentation

## 2012-11-17 DIAGNOSIS — B20 Human immunodeficiency virus [HIV] disease: Secondary | ICD-10-CM | POA: Insufficient documentation

## 2012-11-17 DIAGNOSIS — F17200 Nicotine dependence, unspecified, uncomplicated: Secondary | ICD-10-CM | POA: Insufficient documentation

## 2012-11-17 DIAGNOSIS — Z79899 Other long term (current) drug therapy: Secondary | ICD-10-CM | POA: Insufficient documentation

## 2012-11-17 DIAGNOSIS — R109 Unspecified abdominal pain: Secondary | ICD-10-CM | POA: Insufficient documentation

## 2012-11-17 LAB — CBC WITH DIFFERENTIAL/PLATELET
Basophils Absolute: 0 10*3/uL (ref 0.0–0.1)
Lymphocytes Relative: 35 % (ref 12–46)
Monocytes Relative: 15 % — ABNORMAL HIGH (ref 3–12)
Neutrophils Relative %: 46 % (ref 43–77)
Platelets: 88 10*3/uL — ABNORMAL LOW (ref 150–400)
RDW: 18.8 % — ABNORMAL HIGH (ref 11.5–15.5)
WBC: 2 10*3/uL — ABNORMAL LOW (ref 4.0–10.5)

## 2012-11-17 LAB — COMPREHENSIVE METABOLIC PANEL
Albumin: 2.6 g/dL — ABNORMAL LOW (ref 3.5–5.2)
BUN: 8 mg/dL (ref 6–23)
Chloride: 105 mEq/L (ref 96–112)
Creatinine, Ser: 0.45 mg/dL — ABNORMAL LOW (ref 0.50–1.10)
GFR calc Af Amer: >90 mL/min (ref >90)
GFR calc non Af Amer: >90 mL/min (ref >90)
Total Bilirubin: 0.4 mg/dL (ref 0.3–1.2)

## 2012-11-17 LAB — LIPASE, BLOOD: Lipase: 136 U/L — ABNORMAL HIGH (ref 11–59)

## 2012-11-17 MED ORDER — HYDROMORPHONE HCL PF 1 MG/ML IJ SOLN
1.0000 mg | Freq: Once | INTRAMUSCULAR | Status: AC
  Administered 2012-11-17: 1 mg via INTRAVENOUS
  Filled 2012-11-17: qty 1

## 2012-11-17 MED ORDER — SODIUM CHLORIDE 0.9 % IV SOLN: INTRAVENOUS | Status: DC

## 2012-11-17 MED ORDER — ONDANSETRON HCL 4 MG/2ML IJ SOLN
4.0000 mg | Freq: Once | INTRAMUSCULAR | Status: AC
  Administered 2012-11-17: 4 mg via INTRAVENOUS
  Filled 2012-11-17: qty 2

## 2012-11-17 MED ORDER — MORPHINE SULFATE 15 MG PO TABS: 15.0000 mg | ORAL_TABLET | ORAL | Status: DC | PRN

## 2012-11-17 MED ORDER — SODIUM CHLORIDE 0.9 % IV BOLUS (SEPSIS)
1000.0000 mL | Freq: Once | INTRAVENOUS | Status: AC
  Administered 2012-11-17: 1000 mL via INTRAVENOUS

## 2012-11-17 NOTE — ED Notes (Signed)
Few ice chips  to be given to patient

## 2012-11-17 NOTE — ED Provider Notes (Signed)
History     CSN: [id number]  Arrival date & time 11/17/12  4098   First MD Initiated Contact with Patient 11/17/12 1920      Chief Complaint  Patient presents with  . Abdominal Pain    (Consider location/radiation/quality/duration/timing/severity/associated sxs/prior treatment) Patient is a 62 y.o. female presenting with abdominal pain. The history is provided by the patient.  Abdominal Pain Associated symptoms include abdominal pain.   patient here complaining of worsening chronic abdominal pain since she had her surgery 60 days ago. Patient states that she has been treated with extended release morphine which he has run out of. She presented at this time with her similar abdominal pain. Denies any fever chills. No urinary symptoms. Symptoms constant and nothing makes them worse. Denies any weight loss. No abdominal distention. Has been obtaining pain medication from a relative recently to help her symptoms  Past Medical History  Diagnosis Date  . HIV (human immunodeficiency virus infection)   . Hepatitis C     Past Surgical History  Procedure Laterality Date  . Colonoscopy N/A 09/20/2012    Procedure: COLONOSCOPY;  Surgeon: Theda Belfast, MD;  Location: [facility];  Service: Endoscopy;  Laterality: N/A;  . Colon resection N/A 09/23/2012    Procedure: LAPAROSCOPIC RIGHT COLON RESECTION   ;  Surgeon: Mariella Saa, MD;  Location: [facility];  Service: General;  Laterality: N/A;  LAPOROSCOPY, RIGHT HEMICOLECTOMY    Family History  Problem Relation Age of Onset  . Cancer Mother     History  Substance Use Topics  . Smoking status: Current Every Day Smoker -- 0.30 packs/day    Types: Cigarettes  . Smokeless tobacco: Not on file  . Alcohol Use: No    OB History   Grav Para Term Preterm Abortions TAB SAB Ect Mult Living                  Review of Systems  Gastrointestinal: Positive for abdominal pain.  All other systems reviewed and are negative.    Allergies   Review of patient's allergies indicates no known allergies.  Home Medications   Current Outpatient Rx  Name  Route  Sig  Dispense  Refill  . elvitegravir-cobicistat-emtricitabine-tenofovir (STRIBILD) 150-150-200-300 MG TABS   Oral   Take 1 tablet by mouth every evening.         . docusate sodium 100 MG CAPS   Oral   Take 100 mg by mouth 2 (two) times daily.   10 capsule      . feeding supplement (ENSURE COMPLETE) LIQD   Oral   Take 237 mLs by mouth 3 (three) times daily between meals.         . lactulose (CHRONULAC) 10 GM/15ML solution   Oral   Take 30 mLs (20 g total) by mouth daily. Hold if develops diarrhea.   960 mL   0   . lidocaine-hydrocortisone (ANAMANTEL HC) 3-0.5 % CREA   Rectal   Place 1 Applicatorful rectally 4 (four) times daily as needed (rectal pain).   85 g   0   . morphine (MSIR) 15 MG tablet   Oral   Take 1-2 tablets (15-30 mg total) by mouth every 4 (four) hours as needed.   30 tablet   0   . polyethylene glycol (MIRALAX / GLYCOLAX) packet   Oral   Take 17 g by mouth 2 (two) times daily.   14 each   0   . promethazine (PHENERGAN) 25 MG  tablet   Oral   Take 1 tablet (25 mg total) by mouth every 6 (six) hours as needed for nausea.   30 tablet   2   . spironolactone (ALDACTONE) 25 MG tablet   Oral   Take 1 tablet (25 mg total) by mouth daily.   30 tablet   0   . sulfamethoxazole-trimethoprim (BACTRIM,SEPTRA) 400-80 MG per tablet   Oral   Take 1 tablet by mouth daily.   30 tablet   11     BP 110/73  Pulse 82  Temp(Src) 98.5 F (36.9 C)  Resp 18  SpO2 98%  Physical Exam  Nursing note and vitals reviewed. Constitutional: She is oriented to person, place, and time. She appears well-developed and well-nourished.  Non-toxic appearance. No distress.  HENT:  Head: Normocephalic and atraumatic.  Eyes: Conjunctivae, EOM and lids are normal. Pupils are equal, round, and reactive to light.  Neck: Normal range of motion. Neck  supple. No tracheal deviation present. No mass present.  Cardiovascular: Normal rate, regular rhythm and normal heart sounds.  Exam reveals no gallop.   No murmur heard. Pulmonary/Chest: Effort normal and breath sounds normal. No stridor. No respiratory distress. She has no decreased breath sounds. She has no wheezes. She has no rhonchi. She has no rales.  Abdominal: Soft. Normal appearance and bowel sounds are normal. She exhibits no distension. There is generalized tenderness. There is no rigidity, no rebound, no guarding and no CVA tenderness.  Musculoskeletal: Normal range of motion. She exhibits no edema and no tenderness.  Neurological: She is alert and oriented to person, place, and time. She has normal strength. No cranial nerve deficit or sensory deficit. GCS eye subscore is 4. GCS verbal subscore is 5. GCS motor subscore is 6.  Skin: Skin is warm and dry. No abrasion and no rash noted.  Psychiatric: She has a normal mood and affect. Her speech is normal and behavior is normal.    ED Course  Procedures (including critical care time)  Labs Reviewed  CBC WITH DIFFERENTIAL  COMPREHENSIVE METABOLIC PANEL  LIPASE, BLOOD   No results found.   No diagnosis found.    MDM  Pt given iv fluids and pain meds and feels better--chronic pancreatitis noted on labs, no surgical abd at this time, stable for d/c        Toy Baker, MD 11/17/12 2250

## 2012-11-17 NOTE — ED Notes (Addendum)
Pt states she had surgery to remove a tumor in her colon @ 60 days ago.  Pt states she has had abdominal pain since that time.  Pt endorses nausea and loose stool.  Pt also c/o pain in her feet.  Pt denies fever.  Pt has an appt with her surgeon on 6/6.

## 2012-12-03 ENCOUNTER — Telehealth: Payer: Self-pay | Admitting: Oncology

## 2012-12-03 ENCOUNTER — Telehealth: Payer: Self-pay | Admitting: *Deleted

## 2012-12-03 NOTE — Telephone Encounter (Signed)
PT SCHEDULED TO SEE DR. SHERRILL 07/03 @ 1:30.  WELCOME PACKET MAILED.

## 2012-12-03 NOTE — Telephone Encounter (Signed)
Spoke with patient by phone.  She stated she was ready to make an appointment with Dr. Truett Perna.  She stated she has a lot of other doctor's appointments and accepted July 3rd appointment. Contact names and phone numbers were provided.  She verbalized understanding and was encouraged to keep this appointment.  Dr. Truett Perna aware of appointment date.

## 2012-12-05 ENCOUNTER — Encounter: Payer: Self-pay | Admitting: Internal Medicine

## 2012-12-05 ENCOUNTER — Ambulatory Visit (INDEPENDENT_AMBULATORY_CARE_PROVIDER_SITE_OTHER): Payer: Medicaid Other | Admitting: Internal Medicine

## 2012-12-05 VITALS — BP 101/64 | HR 78 | Temp 98.3°F | Ht 63.5 in | Wt 150.0 lb

## 2012-12-05 DIAGNOSIS — B20 Human immunodeficiency virus [HIV] disease: Secondary | ICD-10-CM

## 2012-12-05 DIAGNOSIS — Z21 Asymptomatic human immunodeficiency virus [HIV] infection status: Secondary | ICD-10-CM

## 2012-12-05 LAB — CBC
MCHC: 32.9 g/dL (ref 30.0–36.0)
Platelets: 103 10*3/uL — ABNORMAL LOW (ref 150–400)
RDW: 16.4 % — ABNORMAL HIGH (ref 11.5–15.5)
WBC: 2.1 10*3/uL — ABNORMAL LOW (ref 4.0–10.5)

## 2012-12-05 MED ORDER — SPIRONOLACTONE 25 MG PO TABS: 25.0000 mg | ORAL_TABLET | Freq: Every day | ORAL | Status: DC

## 2012-12-05 MED ORDER — SULFAMETHOXAZOLE-TRIMETHOPRIM 400-80 MG PO TABS: 1.0000 | ORAL_TABLET | Freq: Every day | ORAL | Status: DC

## 2012-12-05 NOTE — Progress Notes (Signed)
Patient ID: Ruth Stephenson, female   DOB: Oct 30, 1950, 62 y.o.   MRN: [id number]          [facility]ious Disease  Patient Active Problem List   Diagnosis Date Noted  . Postop check 10/24/2012  . Cancer of right colon 10/24/2012  . Nausea with vomiting 10/15/2012  . Diarrhea 10/15/2012  . Unspecified constipation 10/03/2012  . Ileus, postoperative 10/03/2012  . Abdominal pain 09/19/2012  . Pancreatitis 09/19/2012  . Colonic mass - cecum 09/19/2012  . HIV (human immunodeficiency virus infection) 09/19/2012  . Hepatitis C 09/19/2012  . Tobacco abuse 09/19/2012  . Anemia 09/19/2012    Patient's Medications  New Prescriptions   No medications on file  Previous Medications   ELVITEGRAVIR-COBICISTAT-EMTRICITABINE-TENOFOVIR (STRIBILD) 150-150-200-300 MG TABS    Take 1 tablet by mouth every evening.   FEEDING SUPPLEMENT (ENSURE COMPLETE) LIQD    Take 237 mLs by mouth 3 (three) times daily between meals.   LIDOCAINE-HYDROCORTISONE (ANAMANTEL HC) 3-0.5 % CREA    Place 1 Applicatorful rectally 4 (four) times daily as needed (rectal pain).   MORPHINE (MSIR) 15 MG TABLET    Take 1-2 tablets (15-30 mg total) by mouth every 4 (four) hours as needed.   PROMETHAZINE (PHENERGAN) 25 MG TABLET    Take 1 tablet (25 mg total) by mouth every 6 (six) hours as needed for nausea.   SPIRONOLACTONE (ALDACTONE) 25 MG TABLET    Take 1 tablet (25 mg total) by mouth daily.   SULFAMETHOXAZOLE-TRIMETHOPRIM (BACTRIM,SEPTRA) 400-80 MG PER TABLET    Take 1 tablet by mouth daily.  Modified Medications   No medications on file  Discontinued Medications   DOCUSATE SODIUM 100 MG CAPS    Take 100 mg by mouth 2 (two) times daily.   LACTULOSE (CHRONULAC) 10 GM/15ML SOLUTION    Take 30 mLs (20 g total) by mouth daily. Hold if develops diarrhea.   POLYETHYLENE GLYCOL (MIRALAX / GLYCOLAX) PACKET    Take 17 g by mouth 2 (two) times daily.    Subjective: Ruth Stephenson is in for her routine visit. She was able to  get her Medicaid certification recently. He is now been on Stribild for a little over 6 weeks. His only missed 4 days after her first month supply ran out before her Medicaid is approved. She is tolerating it well. Overall she is feeling much better but is still struggling with abdominal pain. She is due to see Dr. Johna Stephenson tomorrow. She has not been to the cancer center yet because she did not realize she could be seen there even if her Medicaid had not been approved yet. She has not had any problem with nausea, vomiting, constipation or diarrhea. She has not restarted her trimethoprim sulfamethoxazole prophylaxis or Aldactone since having her Medicaid approved.  Review of Systems: Pertinent items are noted in HPI.  Past Medical History  Diagnosis Date  . HIV (human immunodeficiency virus infection)   . Hepatitis C     History  Substance Use Topics  . Smoking status: Current Every Day Smoker -- 0.50 packs/day    Types: Cigarettes  . Smokeless tobacco: Not on file     Comment: trying to cut back  . Alcohol Use: No    Family History  Problem Relation Age of Onset  . Cancer Mother     No Known Allergies  Objective: Temp: 98.3 F (36.8 C) (06/12 1430) Temp src: Oral (06/12 1430) BP: 101/64 mmHg (06/12 1430) Pulse Rate: 78 (06/12 1430)  General: She is in good spirits Oral: No oropharyngeal lesions Skin: No rash Lungs: Clear Cor: Regular S1 and S2 no murmurs Abdomen: Distended but soft and nontender. Mood and affect normal  Lab Results HIV 1 RNA Quant (copies/mL)  Date Value  09/19/2012 41785*     CD4 T Cell Abs (cmm)  Date Value  09/19/2012 150*     Assessment: Am hopeful that her HIV is under better control now that she has been on Stribild for over 6 weeks. I will repeat her lab work today and restart her trimethoprim sulfamethoxazole and Aldactone.  Plan: 1. Continue Stribild 2. Restart trimethoprim sulfamethoxazole and Aldactone 3. Followup with Dr. Johna Stephenson  tomorrow 4. Followup at the cancer center is scheduled 5. Followup here in one week   Ruth Asters, MD [facility] for Infectious Disease [facility] [phone] pager   [phone] cell 12/05/2012, 3:05 PM

## 2012-12-06 ENCOUNTER — Ambulatory Visit (INDEPENDENT_AMBULATORY_CARE_PROVIDER_SITE_OTHER): Payer: Medicaid Other | Admitting: General Surgery

## 2012-12-06 ENCOUNTER — Encounter (INDEPENDENT_AMBULATORY_CARE_PROVIDER_SITE_OTHER): Payer: Self-pay | Admitting: General Surgery

## 2012-12-06 VITALS — BP 108/70 | HR 71 | Temp 97.4°F | Resp 16 | Wt 146.8 lb

## 2012-12-06 DIAGNOSIS — C182 Malignant neoplasm of ascending colon: Secondary | ICD-10-CM

## 2012-12-06 LAB — COMPREHENSIVE METABOLIC PANEL WITH GFR
ALT: 29 U/L (ref 0–35)
AST: 73 U/L — ABNORMAL HIGH (ref 0–37)
Albumin: 3.3 g/dL — ABNORMAL LOW (ref 3.5–5.2)
Alkaline Phosphatase: 91 U/L (ref 39–117)
BUN: 6 mg/dL (ref 6–23)
CO2: 26 meq/L (ref 19–32)
Calcium: 9.3 mg/dL (ref 8.4–10.5)
Chloride: 103 meq/L (ref 96–112)
Creat: 0.58 mg/dL (ref 0.50–1.10)
Glucose, Bld: 126 mg/dL — ABNORMAL HIGH (ref 70–99)
Potassium: 6 meq/L — ABNORMAL HIGH (ref 3.5–5.3)
Sodium: 138 meq/L (ref 135–145)
Total Bilirubin: 0.8 mg/dL (ref 0.3–1.2)
Total Protein: 8.4 g/dL — ABNORMAL HIGH (ref 6.0–8.3)

## 2012-12-06 LAB — HIV-1 RNA QUANT-NO REFLEX-BLD
HIV 1 RNA Quant: <20 {copies}/mL
HIV-1 RNA Quant, Log: <1.3 {Log}

## 2012-12-06 LAB — T-HELPER CELL (CD4) - (RCID CLINIC ONLY)
CD4 % Helper T Cell: 18 % — ABNORMAL LOW (ref 33–55)
CD4 T Cell Abs: 130 uL — ABNORMAL LOW (ref 400–2700)

## 2012-12-06 NOTE — Progress Notes (Signed)
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 Patient also has hepatitis C. And early cirrhosis. She basically is getting along pretty well. She still feels there is some "numbness" in her abdominal wall on the right side. She also complains of some generalized abdominal pain which is daily she will take a pain pill and then feel fine. She does not have nausea or vomiting. She feels her strength is overall improving. She does have some diarrhea that goes away with the pain medication.  Exam: BP 108/70  Pulse 71  Temp(Src) 97.4 F (36.3 C) (Temporal)  Resp 16  Wt 146 lb 12.8 oz (66.588 kg)  BMI 25.59 kg/m2 General: Does not appear ill Abdomen: Right upper quadrant incision is well healed. Soft without apparent tenderness. Some mild distention consistent with ascites. No palpable mass or fullness.  Assessment and plan: Post colectomy for locally advanced cancer of the right colon. She has an appointment with medical oncology in early July. She is having a little more discomfort than I would suspect that since talking to her and examining her that this is not a specific complications and and that things will continue to improve over time. She will call as needed for a worsening symptoms otherwise he'll see her back in 4 months.

## 2012-12-12 ENCOUNTER — Ambulatory Visit (INDEPENDENT_AMBULATORY_CARE_PROVIDER_SITE_OTHER): Payer: Medicaid Other | Admitting: Internal Medicine

## 2012-12-12 ENCOUNTER — Encounter: Payer: Self-pay | Admitting: Internal Medicine

## 2012-12-12 VITALS — BP 137/81 | HR 59 | Temp 98.9°F | Ht 63.0 in | Wt 144.5 lb

## 2012-12-12 DIAGNOSIS — R112 Nausea with vomiting, unspecified: Secondary | ICD-10-CM

## 2012-12-12 MED ORDER — ONDANSETRON HCL 4 MG PO TABS: 4.0000 mg | ORAL_TABLET | Freq: Three times a day (TID) | ORAL | Status: DC | PRN

## 2012-12-12 NOTE — Progress Notes (Signed)
Patient ID: Ruth Stephenson, female   DOB: 03/07/1951, 62 y.o.   MRN: [id number]          [facility] for Infectious Disease  Patient Active Problem List   Diagnosis Date Noted  . Cancer of right colon 10/24/2012  . Nausea with vomiting 10/15/2012  . Abdominal pain 09/19/2012  . Pancreatitis 09/19/2012  . HIV (human immunodeficiency virus infection) 09/19/2012  . Hepatitis C 09/19/2012  . Tobacco abuse 09/19/2012  . Anemia 09/19/2012    Patient's Medications  New Prescriptions   ONDANSETRON (ZOFRAN) 4 MG TABLET    Take 1 tablet (4 mg total) by mouth every 8 (eight) hours as needed for nausea.  Previous Medications   ELVITEGRAVIR-COBICISTAT-EMTRICITABINE-TENOFOVIR (STRIBILD) 150-150-200-300 MG TABS    Take 1 tablet by mouth every evening.   FEEDING SUPPLEMENT (ENSURE COMPLETE) LIQD    Take 237 mLs by mouth 3 (three) times daily between meals.   SPIRONOLACTONE (ALDACTONE) 25 MG TABLET    Take 1 tablet (25 mg total) by mouth daily.   SULFAMETHOXAZOLE-TRIMETHOPRIM (BACTRIM,SEPTRA) 400-80 MG PER TABLET    Take 1 tablet by mouth daily.  Modified Medications   No medications on file  Discontinued Medications   LIDOCAINE-HYDROCORTISONE (ANAMANTEL HC) 3-0.5 % CREA    Place 1 Applicatorful rectally 4 (four) times daily as needed (rectal pain).   MORPHINE (MSIR) 15 MG TABLET    Take 1-2 tablets (15-30 mg total) by mouth every 4 (four) hours as needed.   PROMETHAZINE (PHENERGAN) 25 MG TABLET    Take 1 tablet (25 mg total) by mouth every 6 (six) hours as needed for nausea.    Subjective: Ruth Stephenson is in for her routine visit. She has not had any problems obtaining or taking her Stribild and does not believe she is missed any doses. However, even though she has Medicaid now, she says that [employer] told her that it would not cover her trimethoprim sulfamethoxazole so she has not been taking that. She still having some abdominal pain. She has had chronic recurrent abdominal pain associated  with nausea. She had some evidence of pancreatitis when she was hospitalized I suspect she may have been suffering from chronic pancreatitis. She wants more morphine. She says she's not had any narcotics recently. She says that the Phenergan does not help her nausea. She is due to have her first visit at the [facility] for her recently diagnosed colon cancer in early July.  Review of Systems: Pertinent items are noted in HPI.  Past Medical History  Diagnosis Date  . HIV (human immunodeficiency virus infection)   . Hepatitis C     History  Substance Use Topics  . Smoking status: Current Every Day Smoker -- 0.50 packs/day    Types: Cigarettes  . Smokeless tobacco: Not on file     Comment: trying to cut back  . Alcohol Use: No    Family History  Problem Relation Age of Onset  . Cancer Mother     No Known Allergies  Objective: Temp: 98.9 F (37.2 C) (06/19 1553) Temp src: Oral (06/19 1553) BP: 137/81 mmHg (06/19 1553) Pulse Rate: 59 (06/19 1553)  General: She appears uncomfortable due to pain but is very happy about her improving viral load Oral: No oropharyngeal lesions Skin: No rash Lungs: Clear Cor: Regular S1 and S2 with no murmurs Abdomen: Distended but soft with normal bowel sounds. She has moderate epigastric tenderness. All incisions are healed. Mood and affect: Normal  Lab Results HIV 1  RNA Quant (copies/mL)  Date Value  12/05/2012 <20   09/19/2012 41785*     CD4 T Cell Abs (cmm)  Date Value  12/05/2012 130*  09/19/2012 150*     Assessment: Her HIV infection is coming under much better control. I will continue Stribild.  She probably has chronic, recurrent pancreatitis. She does not want to get any blood drawn today. I've encouraged her to stay on a clear liquid diet until she is feeling better. I do not want to give her narcotic pain medication. I will give her a prescription for Zofran.  Plan: 1. Continue Stribild 2. Zofran 4 mg by mouth every 8  hours as needed for nausea 3. Followup in 6 weeks   Cliffton Asters, MD [facility] for Infectious Disease [facility] [phone] pager   [phone] cell 12/12/2012, 4:26 PM

## 2012-12-26 ENCOUNTER — Ambulatory Visit (HOSPITAL_BASED_OUTPATIENT_CLINIC_OR_DEPARTMENT_OTHER): Payer: Medicaid Other | Admitting: Oncology

## 2012-12-26 ENCOUNTER — Encounter: Payer: Self-pay | Admitting: Oncology

## 2012-12-26 ENCOUNTER — Ambulatory Visit: Payer: Medicaid Other

## 2012-12-26 VITALS — BP 113/61 | HR 78 | Temp 98.1°F | Resp 20 | Ht 63.0 in | Wt 140.8 lb

## 2012-12-26 DIAGNOSIS — C182 Malignant neoplasm of ascending colon: Secondary | ICD-10-CM

## 2012-12-26 NOTE — Progress Notes (Signed)
[facility] New Patient Consult   Referring MD: Ruth Stephenson   Ruth Stephenson 62 y.o.  Nov 01, 1950    Reason for Referral: Colon cancer     HPI: She presented with abdominal pain in March of 2014. She reports a one-year history of abdominal pain. A CT on 09/19/2012 revealed changes of cirrhosis. Splenomegaly was noted. Upper abdominal lymph nodes were present felt to potentially be related to HIV, portal hypertension, or "reactive ". A node at the lesser curvature of the stomach measured 14 x 33 mm. Abnormal soft tissue was present in the right. Colic gutter was circumferential mural thickening in the proximal a sending colon. Changes of portal venous hypertension with collaterals in the upper abdomen and recanalization of the periumbilical pain. The pancreas appeared full suggestive of pancreatitis.  She was referred to Dr. Elnoria Stephenson. A colonoscopy on 09/20/2012 revealed an a sending colon mass. A biopsy confirmed invasive adenocarcinoma.  She was referred to Dr. Johna Stephenson and was taken the operating room on 09/23/2012 for a right hemicolectomy. The small bowel was moderately distended. There is a small amount of ascites. The liver was enlarged and nodular. A large firm mass was noted in the right colon in the hepatic flexure with fixation to the lateral peritoneum. The colon proximal to this was distended.  The pathology [id number]) confirmed a moderately differentiated invasive adenocarcinoma. Tumor extended through the muscularis propria. The surgical resection margins were negative. No carcinoma and 30 lymph nodes. No tumor perforation. Lymphovascular and perineural invasion were not identified. The tumor returned K-ras wild-type.  She continues to have "gas "discomfort in the abdomen.  Past Medical History  Diagnosis Date  . HIV (human immunodeficiency virus infection)   . Hepatitis C    .    G6 P6  .   Neuropathy in the feet  .  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 Colonoscopy N/A 09/20/2012    Procedure: COLONOSCOPY;  Surgeon: Ruth Belfast, MD;  Location: [facility];  Service: Endoscopy;  Laterality: N/A;  . Colon resection N/A 09/23/2012    Procedure: LAPAROSCOPIC RIGHT COLON RESECTION   ;  Surgeon: Ruth Saa, MD;  Location: [facility];  Service: General;  Laterality: N/A;  LAPOROSCOPY, RIGHT HEMICOLECTOMY  . Colon surgery  09/23/2012    lap right colectomy    Family History  Problem Relation Age of Onset  . Cancer-? Kidney  Mother     Current outpatient prescriptions:elvitegravir-cobicistat-emtricitabine-tenofovir (STRIBILD) 150-150-200-300 MG TABS, Take 1 tablet by mouth every evening., Disp: , Rfl: ;  feeding supplement (ENSURE COMPLETE) LIQD, Take 237 mLs by mouth 3 (three) times daily between meals., Disp: , Rfl: ;  ondansetron (ZOFRAN) 4 MG tablet, Take 1 tablet (4 mg total) by mouth every 8 (eight) hours as needed for nausea., Disp: 60 tablet, Rfl: 3 sulfamethoxazole-trimethoprim (BACTRIM,SEPTRA) 400-80 MG per tablet, Take 1 tablet by mouth daily., Disp: 30 tablet, Rfl: 11  Allergies: No Known Allergies  Social History: She lives alone in [place]. She is disabled. She smokes cigarettes. She does not use alcohol. No transfusion history.  ROS:   Positives include: Malaise, blood in the stool in 2013 while living in [place], bilateral foot numbness, "foggy "thinking, gas pain in the abdomen  A complete ROS was otherwise negative.  Physical Exam:  Blood pressure 113/61, pulse 78, temperature 98.1 F (36.7 C), temperature source  Oral, resp. rate 20, height 5\' 3"  (1.6 m), weight 140 lb 12.8 oz (63.866 kg), SpO2 100.00%.  HEENT: Slight enlargement of the left compared to the right tonsil without a discrete mass, no thrush, upper and lower denture plate Lungs: Clear bilaterally Cardiac: Regular rate and  rhythm Abdomen: The liver is palpable throughout the upper abdomen with associated tenderness. The spleen is palpable in the left mid abdomen. The abdomen is mildly distended.  Vascular: No leg edema Lymph nodes: No cervical, supra-clavicular, axillary, or inguinal nodes Neurologic: Alert and oriented Skin: No rash Musculoskeletal: No spine tenderness   LAB:  CBC  Lab Results  Component Value Date   WBC 2.1* 12/05/2012   HGB 13.2 12/05/2012   HCT 40.1 12/05/2012   MCV 91.8 12/05/2012   PLT 103* 12/05/2012     CMP      Component Value Date/Time   NA 138 12/05/2012 1532   K 6.0* 12/05/2012 1532   CL 103 12/05/2012 1532   CO2 26 12/05/2012 1532   GLUCOSE 126* 12/05/2012 1532   BUN 6 12/05/2012 1532   CREATININE 0.58 12/05/2012 1532   CREATININE 0.45* 11/17/2012 2030   CALCIUM 9.3 12/05/2012 1532   PROT 8.4* 12/05/2012 1532   ALBUMIN 3.3* 12/05/2012 1532   AST 73* 12/05/2012 1532   ALT 29 12/05/2012 1532   ALKPHOS 91 12/05/2012 1532   BILITOT 0.8 12/05/2012 1532   GFRNONAA >90 11/17/2012 2030   GFRAA >90 11/17/2012 2030   CEA-1.9 on 09/23/2012  Radiology: As per history of present illness   Assessment/Plan:   1. 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 HIV  3. Hepatitis C  4. Cirrhosis with portal hypertension  5. Ongoing tobacco use  6. Nonspecific upper abdominal adenopathy noted on the abdominal CT March 2014  7. 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 I discussed the diagnosis, prognosis, and adjuvant treatment options with her today. We reviewed the details of the surgical pathology report. She has a good prognosis with regard to the colon cancer. There is controversy surrounding the use of adjuvant systemic therapy in patients  with stage II colon cancer. Her tumor does not have "high-risk" features such as lymphovascular/perineural invasion, bowel perforation, limited lymph node sampling, or a markedly elevated CEA. She is now greater than 3 months out from surgery. I do not recommend adjuvant therapy.  I encouraged her to continue close followup with Dr. Orvan Falconer for management of the HIV infection. Control of the HIV infection may improve her prognosis.  She will followup with Dr. Elnoria Stephenson if the abdominal discomfort persists. I suspect the upper abdominal lymph nodes noted on the initial CT are related to HIV infection or cirrhosis.  She will return for a CEA in 3 months. She is scheduled for a 6 month office visit. She should see Dr. Elnoria Stephenson for a one-year surveillance colonoscopy.    Mickelle Goupil 12/26/2012, 3:01 PM

## 2012-12-26 NOTE — Progress Notes (Signed)
Checked in new pt with no financial concerns. °

## 2012-12-30 ENCOUNTER — Telehealth: Payer: Self-pay | Admitting: *Deleted

## 2012-12-30 ENCOUNTER — Telehealth: Payer: Self-pay | Admitting: Oncology

## 2012-12-30 NOTE — Telephone Encounter (Signed)
Needing lab work follow-up per Dr. Orvan Falconer.  Scheduled pt for Friday, July 11 per pt request.  Pt also needs MD f/u appt.  Pt walking on noisy street while talking.  Requested to make appt when she comes for lab work on Friday.  Note made w/ lab work appt.

## 2012-12-30 NOTE — Telephone Encounter (Signed)
s.w. pt and advised on Jan 2015 appt....pt ok and awre.Marland KitchenMarland Kitchen

## 2013-01-03 ENCOUNTER — Other Ambulatory Visit: Payer: Medicaid Other

## 2013-01-06 ENCOUNTER — Other Ambulatory Visit: Payer: Medicaid Other

## 2013-01-06 DIAGNOSIS — E875 Hyperkalemia: Secondary | ICD-10-CM

## 2013-01-06 LAB — BASIC METABOLIC PANEL
BUN: 6 mg/dL (ref 6–23)
CO2: 27 mEq/L (ref 19–32)
Glucose, Bld: 102 mg/dL — ABNORMAL HIGH (ref 70–99)
Potassium: 3.6 mEq/L (ref 3.5–5.3)
Sodium: 137 mEq/L (ref 135–145)

## 2013-01-28 ENCOUNTER — Encounter: Payer: Self-pay | Admitting: Internal Medicine

## 2013-01-28 ENCOUNTER — Ambulatory Visit (INDEPENDENT_AMBULATORY_CARE_PROVIDER_SITE_OTHER): Payer: Medicaid Other | Admitting: Internal Medicine

## 2013-01-28 VITALS — BP 101/64 | HR 66 | Temp 97.9°F | Ht 63.5 in | Wt 150.8 lb

## 2013-01-28 DIAGNOSIS — B20 Human immunodeficiency virus [HIV] disease: Secondary | ICD-10-CM

## 2013-01-28 DIAGNOSIS — R109 Unspecified abdominal pain: Secondary | ICD-10-CM

## 2013-01-28 DIAGNOSIS — K859 Acute pancreatitis without necrosis or infection, unspecified: Secondary | ICD-10-CM

## 2013-01-28 DIAGNOSIS — Z21 Asymptomatic human immunodeficiency virus [HIV] infection status: Secondary | ICD-10-CM

## 2013-01-28 LAB — CBC
Hemoglobin: 12.5 g/dL (ref 12.0–15.0)
MCH: 32.7 pg (ref 26.0–34.0)
MCHC: 34.2 g/dL (ref 30.0–36.0)
MCV: 95.5 fL (ref 78.0–100.0)
RBC: 3.82 MIL/uL — ABNORMAL LOW (ref 3.87–5.11)

## 2013-01-28 MED ORDER — OMEPRAZOLE 20 MG PO CPDR: 20.0000 mg | DELAYED_RELEASE_CAPSULE | Freq: Every day | ORAL | Status: DC

## 2013-01-28 NOTE — Progress Notes (Signed)
Patient ID: Ruth Stephenson, female   DOB: 1951/01/18, 62 y.o.   MRN: [id number]          [facility]  Patient Active Problem List   Diagnosis Date Noted  . Cancer of right colon 10/24/2012  . Nausea with vomiting 10/15/2012  . Abdominal pain 09/19/2012  . Pancreatitis 09/19/2012  . HIV (human immunodeficiency virus infection) 09/19/2012  . Hepatitis C 09/19/2012  . Tobacco abuse 09/19/2012  . Anemia 09/19/2012    Patient's Medications  New Prescriptions   OMEPRAZOLE (PRILOSEC) 20 MG CAPSULE    Take 1 capsule (20 mg total) by mouth daily.  Previous Medications   ELVITEGRAVIR-COBICISTAT-EMTRICITABINE-TENOFOVIR (STRIBILD) 150-150-200-300 MG TABS    Take 1 tablet by mouth every evening.   FEEDING SUPPLEMENT (ENSURE COMPLETE) LIQD    Take 237 mLs by mouth 3 (three) times daily between meals.   ONDANSETRON (ZOFRAN) 4 MG TABLET    Take 1 tablet (4 mg total) by mouth every 8 (eight) hours as needed for nausea.   SULFAMETHOXAZOLE-TRIMETHOPRIM (BACTRIM,SEPTRA) 400-80 MG PER TABLET    Take 1 tablet by mouth daily.  Modified Medications   No medications on file  Discontinued Medications   RANITIDINE (ZANTAC) 150 MG CAPSULE    Take 150 mg by mouth 2 (two) times daily.    Subjective: Ruth Stephenson is in for her routine visit. Shortly after her last visit her abdominal pain subsided and she was able he normally again. She had been feeling much better and having normal bowel movements. She was seen by Dr. Truett Perna at the [facility] who did not recommend any adjuvant chemotherapy for her stage II colon cancer. Yesterday she began to have some recurrent abdominal pain that was quite severe. She had one episode of nausea. She has not had any vomiting, diarrhea or constipation but states that she did not have a bowel movement today. She did notice that her abdominal pain did get worse after eating a [place] sandwich today. She recalls that he she used to take Nexium all living  in [place] for similar pains. Her prescription had run out so she had been taking Zantac which was offering only a little bit of relief.  She is still struggling to get her Medicaid approval. Fortunately she has not had any trouble obtaining her Stribild and denies missing any doses.  She is under a great deal of stress because of unstable housing. She is hoping that she will get her own apartment soon. She does not feel like she will be able to try to quit smoking cigarettes until this issue is resolved.  At the end of the exam she tells me that she did something that she regrets. She recently had been seeing a new female partner and they had sexual activity and he did not use condoms. She feels badly that she did disclose her status to him.  Review of Systems: Constitutional: negative Eyes: negative Ears, nose, mouth, throat, and face: negative Respiratory: negative Cardiovascular: negative Gastrointestinal: positive for abdominal pain, change in bowel habits, nausea and reflux symptoms, negative for constipation, diarrhea, odynophagia and vomiting Genitourinary:negative  Past Medical History  Diagnosis Date  . HIV (human immunodeficiency virus infection)   . Hepatitis C     History  Substance Use Topics  . Smoking status: Current Every Day Smoker -- 0.50 packs/day    Types: Cigarettes  . Smokeless tobacco: Not on file     Comment: trying to cut back  . Alcohol  Use: No    Family History  Problem Relation Age of Onset  . Cancer Mother     No Known Allergies  Objective: Temp: 97.9 F (36.6 C) (08/05 1529) Temp src: Oral (08/05 1529) BP: 101/64 mmHg (08/05 1529) Pulse Rate: 66 (08/05 1529)  General: She appears to be feeling better and seems happier Oral: No oropharyngeal lesions Skin: No rash Lungs: Clear Cor: Regular S1 and S2 no murmurs Abdomen: Distended, soft with mild diffuse tenderness quiet bowel sounds Joints and extremities: No acute abnormalities Neuro:  Alert with normal speech and conversation Mood and affect: Normal  Lab Results HIV 1 RNA Quant (copies/mL)  Date Value  12/05/2012 <20   09/19/2012 41785*     CD4 T Cell Abs (cmm)  Date Value  12/05/2012 130*  09/19/2012 150*     Assessment: Her HIV infection is coming under much better control since restarting Stribild.  I'm not sure for recurrent abdominal pain today as a relapse of her pancreatitis, severe reflux or both. I will check lab work today and will start her on Prilosec and have her stop taking the Zantac.  Talked to her about the importance of partner selection and safer sex as well as the need to disclose her status. I emphasized that she puts herself at risk if her partner does not use condoms.  Plan: 1. Continue Stribild 2. Check CD4 count, viral load, CBC, complete metabolic panel, amylase and lipase 3. Change Zantac to Prilosec 4. Prevention for positive counseling provided 5. Followup in 3 weeks   Cliffton Asters, MD [facility] [phone] pager   [phone] cell 01/28/2013, 4:25 PM

## 2013-01-29 LAB — AMYLASE: Amylase: 50 U/L (ref 0–105)

## 2013-01-29 LAB — COMPREHENSIVE METABOLIC PANEL
Alkaline Phosphatase: 78 U/L (ref 39–117)
BUN: 10 mg/dL (ref 6–23)
CO2: 32 mEq/L (ref 19–32)
Creat: 0.75 mg/dL (ref 0.50–1.10)
Glucose, Bld: 92 mg/dL (ref 70–99)
Total Bilirubin: 0.8 mg/dL (ref 0.3–1.2)

## 2013-01-29 LAB — T-HELPER CELL (CD4) - (RCID CLINIC ONLY): CD4 % Helper T Cell: 21 % — ABNORMAL LOW (ref 33–55)

## 2013-01-29 LAB — LIPASE: Lipase: 25 U/L (ref 0–75)

## 2013-01-30 ENCOUNTER — Telehealth: Payer: Self-pay | Admitting: *Deleted

## 2013-01-30 LAB — HIV-1 RNA QUANT-NO REFLEX-BLD: HIV 1 RNA Quant: <20 copies/mL (ref <20)

## 2013-01-30 NOTE — Telephone Encounter (Signed)
Patient called for her lab results, c/o of still having abdominal pain. Advised her to give the Prilosec a little time to work, and told her I would route a message to Dr. Orvan Falconer. The add on labs Amylase and Lipase looked normal. Wendall Mola

## 2013-02-06 ENCOUNTER — Telehealth: Payer: Self-pay | Admitting: *Deleted

## 2013-02-06 NOTE — Telephone Encounter (Signed)
RN went over the lab results and their significance.  Praised pt for taking her medications.  Reminded pt about her upcoming PAP smear appt on 02/17/13.  Pt verbalized understanding.

## 2013-02-13 ENCOUNTER — Telehealth: Payer: Self-pay | Admitting: Oncology

## 2013-02-13 NOTE — Telephone Encounter (Signed)
Talked to pt and gave her appt for 1/15 lab and MD,r/s from 06/30/12

## 2013-02-17 ENCOUNTER — Ambulatory Visit: Payer: Medicaid Other

## 2013-03-17 ENCOUNTER — Emergency Department (HOSPITAL_COMMUNITY)
Admission: EM | Admit: 2013-03-17 | Discharge: 2013-03-17 | Disposition: A | Discharge status: Home / Self Care | Payer: Medicaid Other | Attending: Emergency Medicine | Admitting: Emergency Medicine

## 2013-03-17 ENCOUNTER — Emergency Department (HOSPITAL_COMMUNITY): Payer: Medicaid Other

## 2013-03-17 ENCOUNTER — Encounter (HOSPITAL_COMMUNITY): Payer: Self-pay | Admitting: Emergency Medicine

## 2013-03-17 DIAGNOSIS — Z8619 Personal history of other infectious and parasitic diseases: Secondary | ICD-10-CM | POA: Insufficient documentation

## 2013-03-17 DIAGNOSIS — Z21 Asymptomatic human immunodeficiency virus [HIV] infection status: Secondary | ICD-10-CM | POA: Insufficient documentation

## 2013-03-17 DIAGNOSIS — J159 Unspecified bacterial pneumonia: Secondary | ICD-10-CM | POA: Insufficient documentation

## 2013-03-17 DIAGNOSIS — Z79899 Other long term (current) drug therapy: Secondary | ICD-10-CM | POA: Insufficient documentation

## 2013-03-17 DIAGNOSIS — J189 Pneumonia, unspecified organism: Secondary | ICD-10-CM

## 2013-03-17 DIAGNOSIS — F17200 Nicotine dependence, unspecified, uncomplicated: Secondary | ICD-10-CM | POA: Insufficient documentation

## 2013-03-17 LAB — CBC WITH DIFFERENTIAL/PLATELET
Basophils Relative: 0 % (ref 0–1)
Eosinophils Relative: 1 % (ref 0–5)
HCT: 36.4 % (ref 36.0–46.0)
Lymphocytes Relative: 29 % (ref 12–46)
Lymphs Abs: 0.5 10*3/uL — ABNORMAL LOW (ref 0.7–4.0)
MCV: 98.9 fL (ref 78.0–100.0)
Monocytes Absolute: 0.2 10*3/uL (ref 0.1–1.0)
Neutro Abs: 1 10*3/uL — ABNORMAL LOW (ref 1.7–7.7)
RBC: 3.68 MIL/uL — ABNORMAL LOW (ref 3.87–5.11)
WBC: 1.8 10*3/uL — ABNORMAL LOW (ref 4.0–10.5)

## 2013-03-17 LAB — URINALYSIS, ROUTINE W REFLEX MICROSCOPIC
Bilirubin Urine: NEGATIVE
Ketones, ur: NEGATIVE mg/dL
Leukocytes, UA: NEGATIVE
Nitrite: NEGATIVE
Urobilinogen, UA: 1 mg/dL (ref 0.0–1.0)
pH: 6 (ref 5.0–8.0)

## 2013-03-17 LAB — BASIC METABOLIC PANEL
BUN: 6 mg/dL (ref 6–23)
Chloride: 104 mEq/L (ref 96–112)
Creatinine, Ser: 0.71 mg/dL (ref 0.50–1.10)
GFR calc Af Amer: >90 mL/min (ref >90)
GFR calc non Af Amer: >90 mL/min (ref >90)
Potassium: 3.7 mEq/L (ref 3.5–5.1)
Sodium: 139 mEq/L (ref 135–145)

## 2013-03-17 LAB — URINE MICROSCOPIC-ADD ON

## 2013-03-17 LAB — POCT I-STAT TROPONIN I: Troponin i, poc: 0.04 ng/mL (ref 0.00–0.08)

## 2013-03-17 MED ORDER — LEVOFLOXACIN 250 MG PO TABS: 750.0000 mg | ORAL_TABLET | Freq: Every day | ORAL | Status: DC

## 2013-03-17 MED ORDER — HYDROCODONE-ACETAMINOPHEN 5-325 MG PO TABS: 2.0000 | ORAL_TABLET | Freq: Four times a day (QID) | ORAL | Status: DC | PRN

## 2013-03-17 MED ORDER — HYDROCODONE-ACETAMINOPHEN 5-325 MG PO TABS
2.0000 | ORAL_TABLET | Freq: Once | ORAL | Status: AC
  Administered 2013-03-17: 2 via ORAL
  Filled 2013-03-17: qty 2

## 2013-03-17 NOTE — ED Notes (Signed)
Cp and cold and congestion x 4 days some sob, pt cont to smoke, nausea also

## 2013-03-17 NOTE — ED Provider Notes (Signed)
CSN: [id number]     Arrival date & time 03/17/13  4098 History   First MD Initiated Contact with Patient 03/17/13 0845     Chief Complaint  Patient presents with  . Chest Pain   (Consider location/radiation/quality/duration/timing/severity/associated sxs/prior Treatment) HPI Comments: Patient presents to the emergency department with chief complaint of cough and cold like symptoms. She is also complaining of associated chest pain and shortness of breath. She believes this is due to to the congestion and cold-like symptoms. She states that she's been feeling sick for the past 4 days. She has not tried anything to alleviate her symptoms. She states that she is going on a cruise in a couple weeks, and wanted to be certain that she did not have a pneumonia. She has past medical history remarkable for HIV. She states that her latest CD4 count is 200 but states that her viral load is undetectable. She is followed by Dr. Orvan Falconer. Denies any other problems at this time.  The history is provided by the patient. No language interpreter was used.    Past Medical History  Diagnosis Date  . HIV (human immunodeficiency virus infection)   . Hepatitis C    Past Surgical History  Procedure Laterality Date  . Colonoscopy N/A 09/20/2012    Procedure: COLONOSCOPY;  Surgeon: Theda Belfast, MD;  Location: [facility];  Service: Endoscopy;  Laterality: N/A;  . Colon resection N/A 09/23/2012    Procedure: LAPAROSCOPIC RIGHT COLON RESECTION   ;  Surgeon: Mariella Saa, MD;  Location: [facility] ORS;  Service: General;  Laterality: N/A;  LAPOROSCOPY, RIGHT HEMICOLECTOMY  . Colon surgery  09/23/2012    lap right colectomy   Family History  Problem Relation Age of Onset  . Cancer Mother    History  Substance Use Topics  . Smoking status: Current Every Day Smoker -- 0.50 packs/day    Types: Cigarettes  . Smokeless tobacco: Not on file     Comment: trying to cut back  . Alcohol Use: No   OB History   Grav  Para Term Preterm Abortions TAB SAB Ect Mult Living                 Review of Systems  All other systems reviewed and are negative.    Allergies  Review of patient's allergies indicates no known allergies.  Home Medications   Current Outpatient Rx  Name  Route  Sig  Dispense  Refill  . elvitegravir-cobicistat-emtricitabine-tenofovir (STRIBILD) 150-150-200-300 MG TABS   Oral   Take 1 tablet by mouth every evening.         . feeding supplement (ENSURE COMPLETE) LIQD   Oral   Take 237 mLs by mouth 3 (three) times daily between meals.         Marland Kitchen omeprazole (PRILOSEC) 20 MG capsule   Oral   Take 1 capsule (20 mg total) by mouth daily.   30 capsule   11   . ondansetron (ZOFRAN) 4 MG tablet   Oral   Take 1 tablet (4 mg total) by mouth every 8 (eight) hours as needed for nausea.   60 tablet   3   . sulfamethoxazole-trimethoprim (BACTRIM,SEPTRA) 400-80 MG per tablet   Oral   Take 1 tablet by mouth daily.   30 tablet   11    There were no vitals taken for this visit. Physical Exam  Nursing note and vitals reviewed. Constitutional: She is oriented to person, place, and time. She appears well-developed  and well-nourished.  HENT:  Head: Normocephalic and atraumatic.  Eyes: Conjunctivae and EOM are normal. Pupils are equal, round, and reactive to light.  Neck: Normal range of motion. Neck supple.  Cardiovascular: Normal rate and regular rhythm.  Exam reveals no gallop and no friction rub.   No murmur heard. Pulmonary/Chest: Effort normal. No respiratory distress. She has wheezes. She has rales. She exhibits no tenderness.  Abdominal: Soft. Bowel sounds are normal. She exhibits no distension and no mass. There is no tenderness. There is no rebound and no guarding.  Musculoskeletal: Normal range of motion. She exhibits no edema and no tenderness.  Neurological: She is alert and oriented to person, place, and time.  Skin: Skin is warm and dry.  Psychiatric: She has a  normal mood and affect. Her behavior is normal. Judgment and thought content normal.    ED Course  Procedures (including critical care time) Results for orders placed during the hospital encounter of 03/17/13  CBC WITH DIFFERENTIAL      Result Value Range   WBC 1.8 (*) 4.0 - 10.5 K/uL   RBC 3.68 (*) 3.87 - 5.11 MIL/uL   Hemoglobin 12.1  12.0 - 15.0 g/dL   HCT 78.2  95.6 - 21.3 %   MCV 98.9  78.0 - 100.0 fL   MCH 32.9  26.0 - 34.0 pg   MCHC 33.2  30.0 - 36.0 g/dL   RDW 08.6  57.8 - 46.9 %   Platelets 79 (*) 150 - 400 K/uL   Neutrophils Relative % 56  43 - 77 %   Neutro Abs 1.0 (*) 1.7 - 7.7 K/uL   Lymphocytes Relative 29  12 - 46 %   Lymphs Abs 0.5 (*) 0.7 - 4.0 K/uL   Monocytes Relative 14 (*) 3 - 12 %   Monocytes Absolute 0.2  0.1 - 1.0 K/uL   Eosinophils Relative 1  0 - 5 %   Eosinophils Absolute 0.0  0.0 - 0.7 K/uL   Basophils Relative 0  0 - 1 %   Basophils Absolute 0.0  0.0 - 0.1 K/uL  BASIC METABOLIC PANEL      Result Value Range   Sodium 139  135 - 145 mEq/L   Potassium 3.7  3.5 - 5.1 mEq/L   Chloride 104  96 - 112 mEq/L   CO2 28  19 - 32 mEq/L   Glucose, Bld 124 (*) 70 - 99 mg/dL   BUN 6  6 - 23 mg/dL   Creatinine, Ser 6.29  0.50 - 1.10 mg/dL   Calcium 8.9  8.4 - 52.8 mg/dL   GFR calc non Af Amer >90  >90 mL/min   GFR calc Af Amer >90  >90 mL/min  URINALYSIS, ROUTINE W REFLEX MICROSCOPIC      Result Value Range   Color, Urine YELLOW  YELLOW   APPearance CLOUDY (*) CLEAR   Specific Gravity, Urine 1.020  1.005 - 1.030   pH 6.0  5.0 - 8.0   Glucose, UA NEGATIVE  NEGATIVE mg/dL   Hgb urine dipstick TRACE (*) NEGATIVE   Bilirubin Urine NEGATIVE  NEGATIVE   Ketones, ur NEGATIVE  NEGATIVE mg/dL   Protein, ur NEGATIVE  NEGATIVE mg/dL   Urobilinogen, UA 1.0  0.0 - 1.0 mg/dL   Nitrite NEGATIVE  NEGATIVE   Leukocytes, UA NEGATIVE  NEGATIVE  URINE MICROSCOPIC-ADD ON      Result Value Range   Squamous Epithelial / LPF MANY (*) RARE   WBC, UA 0-2  <  3 WBC/hpf   RBC /  HPF 0-2  <3 RBC/hpf   Bacteria, UA FEW (*) RARE  POCT I-STAT TROPONIN I      Result Value Range   Troponin i, poc 0.04  0.00 - 0.08 ng/mL   Comment 3            Dg Chest 2 View  03/17/2013   CLINICAL DATA:  Chest pain, shortness of breath. Weakness this morning.  EXAM: CHEST  2 VIEW  COMPARISON:  11/17/2012  FINDINGS: Heart size is normal. There is patchy infiltrate in the right middle lobe. No pulmonary edema. The left lung is clear. Mild perihilar peribronchial thickening is noted. No pleural effusions.  IMPRESSION: Right middle lobe infiltrate.   Electronically Signed   By: Rosalie Gums M.D.   On: 03/17/2013 09:53     Dg Chest 2 View  03/17/2013   CLINICAL DATA:  Chest pain, shortness of breath. Weakness this morning.  EXAM: CHEST  2 VIEW  COMPARISON:  11/17/2012  FINDINGS: Heart size is normal. There is patchy infiltrate in the right middle lobe. No pulmonary edema. The left lung is clear. Mild perihilar peribronchial thickening is noted. No pleural effusions.  IMPRESSION: Right middle lobe infiltrate.   Electronically Signed   By: Rosalie Gums M.D.   On: 03/17/2013 09:53   ED ECG REPORT  I personally interpreted this EKG   Date: 03/17/2013   Rate: 72  Rhythm: normal sinus rhythm  QRS Axis: normal  Intervals: normal  ST/T Wave abnormalities: normal  Conduction Disutrbances:none  Narrative Interpretation:   Old EKG Reviewed: unchanged   MDM   1. CAP (community acquired pneumonia)    Patient with cough and cold. Suspect this is likely just a URI, however with the HIV history, we'll check basic labs, chest x-ray.  CXR remarkable for right middle lobe infiltrate.  Will treat for CAP.  Will treat with Levaquin.  Recommend ID follow-up this week.  Patient's O2 sat is >90% with ambulation.  She is not in any apparent distress.  Discharge to home. Afebrile.  Filed Vitals:   03/17/13 1139  BP: 147/70  Pulse: 61  Temp:   Resp: 16     Patient seen by and discussed with Dr. Loretha Stapler,  who agrees to plan.  Roxy Horseman, PA-C 03/17/13 1147

## 2013-03-17 NOTE — ED Provider Notes (Signed)
Medical screening examination/treatment/procedure(s) were conducted as a shared visit with non-physician practitioner(s) and myself.  I personally evaluated the patient during the encounter  62 year old female with history of HIV presenting with cough productive of sputum. No fevers. Nontoxic appearing. Vital signs stable. Normal respirations and oxygen saturations. Lungs show rails in right middle and lower lung fields. Chest x-ray consistent with pneumonia. She endorses compliance with her antiretrovirals with an undetectable viral load and CD4 count of 200 recently. Plan to ambulate to check pulse oximetry, with likely outpatient treatment and followup in ID clinic.  Clinical Impression: 1. CAP (community acquired pneumonia)       Candyce Churn, MD 03/18/13 [phone]

## 2013-03-17 NOTE — ED Notes (Signed)
While ambulating, pt's O2 sats stayed between 95% and 97%. 

## 2013-03-18 ENCOUNTER — Telehealth: Payer: Self-pay | Admitting: Internal Medicine

## 2013-03-18 NOTE — Progress Notes (Signed)
   CARE MANAGEMENT ED NOTE 03/18/2013  Patient:  Ruth Stephenson, Ruth Stephenson   Account Number:  [id number]  Date Initiated:  03/18/2013  Documentation initiated by:  Edd Arbour  Subjective/Objective Assessment:     Subjective/Objective Assessment Detail:   ED CM Received a voice message left by pt on 03/18/13 1301 stating she needed assistance for levaquin 750 mg (3 tabs) daily total quantity 15 ordered from [facility] ED d/c on 03/17/13 Reports issues with her medicaid [place] access at her local pharmacy indicating she has another Astronomer. PMH HIV, hep c     Action/Plan:   Action/Plan Detail:   [facility] ED CM referred pt to DSS to clarify medicaid coverage & agreed to request EDP change to affordable med CM spoke with Dr Oletta Lamas, Dr Radford Pax and Dr browning about pt concerns   Anticipated DC Date:  03/17/2013     Status Recommendation to Physician:   Result of Recommendation:    Other ED Services  Consult Working Plan    DC Planning Services  Other  PCP issues  Medication Assistance    Choice offered to / List presented to:         Status of service:  Completed, signed off  ED Comments:   ED Comments Detail:  [facility] ED CM spoke with Dr Oletta Lamas, Dr Radford Pax and Dr browning about pt concerns 1416 left a voice message for Dr Celesta Aver nurse 1420 Spoke with Pam at Dr Blair Dolphin office & obtained MD pager [phone] Spoke with Dr Orvan Falconer who provided Septra ds 1  po bid for 5 days total quantity of 10 tabs 1440 CM left a voice message for pt [phone] requesting a return call 1522 CM spoke with Lurena Joiner to call in Septra DS 1 tab bid for 5 days total quantity of 10 tabs Pt reports she had received a call from [facility] that her medicaid issues had been resolved

## 2013-03-18 NOTE — Telephone Encounter (Signed)
I received a call today from the case manager in the emergency department. Ruth Stephenson was seen in the emergency department yesterday and given a provisional diagnosis of community-acquired pneumonia. She was prescribed levofloxacin but called back today stating that she is having problems with her Medicaid and cannot afford to pay for levofloxacin out of pocket. I reviewed her chest x-ray and did not feel like she has any acute change. She has a shadow in the right lower lobe that is unchanged from previous films. Her latest CD4 count is 200. I will treat her with oral trimethoprim sulfamethoxazole one double strength tablet twice daily for 5 days as a much less expensive alternative for acute respiratory infection. Suggested that Ruth Stephenson call our clinic if she continues to have problem accessing Medicaid.

## 2013-03-19 NOTE — ED Provider Notes (Signed)
Medical screening examination/treatment/procedure(s) were conducted as a shared visit with non-physician practitioner(s) and myself.  I personally evaluated the patient during the encounter.   Please see my separate note.     Candyce Churn, MD 03/19/13 1000

## 2013-03-25 ENCOUNTER — Encounter (INDEPENDENT_AMBULATORY_CARE_PROVIDER_SITE_OTHER): Payer: Self-pay

## 2013-04-11 ENCOUNTER — Encounter: Payer: Self-pay | Admitting: *Deleted

## 2013-05-16 ENCOUNTER — Other Ambulatory Visit: Payer: Self-pay

## 2013-05-16 DIAGNOSIS — B20 Human immunodeficiency virus [HIV] disease: Secondary | ICD-10-CM

## 2013-05-16 MED ORDER — SULFAMETHOXAZOLE-TMP DS 800-160 MG PO TABS: 1.0000 | ORAL_TABLET | Freq: Every day | ORAL | Status: DC

## 2013-06-27 ENCOUNTER — Telehealth: Payer: Self-pay | Admitting: *Deleted

## 2013-06-27 NOTE — Telephone Encounter (Signed)
Made appts

## 2013-06-30 ENCOUNTER — Other Ambulatory Visit: Payer: Medicaid Other | Admitting: Lab

## 2013-06-30 ENCOUNTER — Ambulatory Visit: Payer: Medicaid Other | Admitting: Oncology

## 2013-07-04 ENCOUNTER — Ambulatory Visit: Payer: Medicaid Other

## 2013-07-04 ENCOUNTER — Telehealth: Payer: Self-pay | Admitting: *Deleted

## 2013-07-04 ENCOUNTER — Other Ambulatory Visit: Payer: Medicaid Other

## 2013-07-04 NOTE — Telephone Encounter (Signed)
Rescheduled for 07/11/13.  Pt got the date wrong.

## 2013-07-10 ENCOUNTER — Telehealth: Payer: Self-pay | Admitting: *Deleted

## 2013-07-10 ENCOUNTER — Other Ambulatory Visit: Payer: Medicaid Other

## 2013-07-10 ENCOUNTER — Telehealth: Payer: Self-pay | Admitting: Oncology

## 2013-07-10 ENCOUNTER — Ambulatory Visit: Payer: Medicaid Other | Admitting: Oncology

## 2013-07-10 NOTE — Telephone Encounter (Signed)
Left VM for patient call to reschedule her missed appointment. Emphasized how important follow up/observation is for her.

## 2013-07-10 NOTE — Telephone Encounter (Signed)
returned pt's call re r/s 1/15 appt. pt given new appt for lb/fu 2/26.

## 2013-07-11 ENCOUNTER — Other Ambulatory Visit: Payer: Medicaid Other

## 2013-07-11 ENCOUNTER — Ambulatory Visit: Payer: Medicaid Other

## 2013-07-11 ENCOUNTER — Telehealth: Payer: Self-pay | Admitting: *Deleted

## 2013-07-11 NOTE — Telephone Encounter (Signed)
Has appt Monday, January 19 w/ Dr. Megan Salon.  Will reschedule pap at that time.

## 2013-07-14 ENCOUNTER — Ambulatory Visit (INDEPENDENT_AMBULATORY_CARE_PROVIDER_SITE_OTHER): Payer: Medicaid Other | Admitting: Internal Medicine

## 2013-07-14 ENCOUNTER — Encounter: Payer: Self-pay | Admitting: Internal Medicine

## 2013-07-14 VITALS — BP 131/76 | HR 57 | Temp 98.3°F | Ht 63.5 in | Wt 151.8 lb

## 2013-07-14 DIAGNOSIS — B1920 Unspecified viral hepatitis C without hepatic coma: Secondary | ICD-10-CM

## 2013-07-14 DIAGNOSIS — B20 Human immunodeficiency virus [HIV] disease: Secondary | ICD-10-CM

## 2013-07-14 DIAGNOSIS — Z21 Asymptomatic human immunodeficiency virus [HIV] infection status: Secondary | ICD-10-CM

## 2013-07-14 DIAGNOSIS — Z23 Encounter for immunization: Secondary | ICD-10-CM

## 2013-07-14 LAB — LIPID PANEL
Cholesterol: 129 mg/dL (ref 0–200)
HDL: 51 mg/dL (ref >39)
LDL CALC: 71 mg/dL (ref 0–99)
Total CHOL/HDL Ratio: 2.5 Ratio
Triglycerides: 34 mg/dL (ref <150)
VLDL: 7 mg/dL (ref 0–40)

## 2013-07-14 LAB — CBC
HCT: 39 % (ref 36.0–46.0)
Hemoglobin: 13.4 g/dL (ref 12.0–15.0)
MCH: 32.9 pg (ref 26.0–34.0)
MCHC: 34.4 g/dL (ref 30.0–36.0)
MCV: 95.8 fL (ref 78.0–100.0)
Platelets: 76 10*3/uL — ABNORMAL LOW (ref 150–400)
RBC: 4.07 MIL/uL (ref 3.87–5.11)
RDW: 13.7 % (ref 11.5–15.5)
WBC: 1.5 10*3/uL — ABNORMAL LOW (ref 4.0–10.5)

## 2013-07-14 LAB — COMPREHENSIVE METABOLIC PANEL
ALK PHOS: 85 U/L (ref 39–117)
ALT: 30 U/L (ref 0–35)
AST: 43 U/L — AB (ref 0–37)
Albumin: 3.6 g/dL (ref 3.5–5.2)
BUN: 8 mg/dL (ref 6–23)
CALCIUM: 8.7 mg/dL (ref 8.4–10.5)
CO2: 29 mEq/L (ref 19–32)
CREATININE: 0.65 mg/dL (ref 0.50–1.10)
Chloride: 104 mEq/L (ref 96–112)
Glucose, Bld: 112 mg/dL — ABNORMAL HIGH (ref 70–99)
Potassium: 4.4 mEq/L (ref 3.5–5.3)
Sodium: 141 mEq/L (ref 135–145)
Total Bilirubin: 0.8 mg/dL (ref 0.3–1.2)
Total Protein: 7 g/dL (ref 6.0–8.3)

## 2013-07-14 LAB — IRON: Iron: 106 ug/dL (ref 42–145)

## 2013-07-14 NOTE — Progress Notes (Signed)
Patient ID: Ruth Stephenson, female   DOB: [birth year], 63 y.o.   MRN: [id number]          [facility]  Patient Active Problem List   Diagnosis Date Noted  . Cancer of right colon 10/24/2012  . Nausea with vomiting 10/15/2012  . Abdominal pain 09/19/2012  . Pancreatitis 09/19/2012  . HIV (human immunodeficiency virus infection) 09/19/2012  . Hepatitis C 09/19/2012  . Tobacco abuse 09/19/2012  . Anemia 09/19/2012    Patient's Medications  New Prescriptions   No medications on file  Previous Medications   ELVITEGRAVIR-COBICISTAT-EMTRICITABINE-TENOFOVIR (STRIBILD) 150-150-200-300 MG TABS    Take 1 tablet by mouth every evening.    MULTIPLE VITAMIN (MULTIVITAMIN WITH MINERALS) TABS TABLET    Take 1 tablet by mouth daily.   SULFAMETHOXAZOLE-TRIMETHOPRIM (BACTRIM DS) 800-160 MG PER TABLET    Take 1 tablet by mouth daily.  Modified Medications   No medications on file  Discontinued Medications   HYDROCODONE-ACETAMINOPHEN (NORCO/VICODIN) 5-325 MG PER TABLET    Take 2 tablets by mouth every 6 (six) hours as needed for pain.   LEVOFLOXACIN (LEVAQUIN) 250 MG TABLET    Take 3 tablets (750 mg total) by mouth daily.    Subjective: Ruth Stephenson is in for her routine visit. She states that she is still dealing with a great deal of stress but overall is feeling much better. She has not missed a single dose of her Stribild but will run out after today's dose. She is unsure if she needs more refills to [facility].  She is scheduled for followup at the [place] next month to evaluate for colon cancer. She is concerned that stress may actually precipitate recurrence of her cancer and she is worried about going through with that appointment.  She is interested in learning about the newer treatments for hepatitis C.  She still smoking cigarettes daily and has no current plans to quit.  Review of Systems: Constitutional: negative Eyes: negative Ears, nose, mouth,  throat, and face: negative Respiratory: negative Cardiovascular: negative Gastrointestinal: negative Genitourinary:negative  Past Medical History  Diagnosis Date  . HIV (human immunodeficiency virus infection)   . Hepatitis C     History  Substance Use Topics  . Smoking status: Current Every Day Smoker -- 0.30 packs/day    Types: Cigarettes  . Smokeless tobacco: Not on file     Comment: trying to cut back  . Alcohol Use: No    Family History  Problem Relation Age of Onset  . Cancer Mother     No Known Allergies  Objective: Temp: 98.3 F (36.8 C) (01/19 1406) Temp src: Oral (01/19 1406) BP: 131/76 mmHg (01/19 1406) Pulse Rate: 57 (01/19 1406)  General: She is well dressed and in no distress Oral: No oropharyngeal lesions Skin: No rash Lungs: Clear Cor: Regular S1-S2 no murmurs Abdomen: Soft and nontender Joints and extremities: Normal Neuro: Alert and fully oriented with normal speech and conversation. Normal gait Mood and affect: Normal  Lab Results Lab Results  Component Value Date   WBC 1.8* 03/17/2013   HGB 12.1 03/17/2013   HCT 36.4 03/17/2013   MCV 98.9 03/17/2013   PLT 79* 03/17/2013    Lab Results  Component Value Date   CREATININE 0.71 03/17/2013   BUN 6 03/17/2013   NA 139 03/17/2013   K 3.7 03/17/2013   CL 104 03/17/2013   CO2 28 03/17/2013    Lab Results  Component Value Date   ALT 36*  01/28/2013   AST 50* 01/28/2013   ALKPHOS 78 01/28/2013   BILITOT 0.8 01/28/2013    Lab Results  Component Value Date   CHOL 63 09/19/2012   HDL 14* 09/19/2012   LDLCALC 43 09/19/2012   TRIG 32 09/19/2012   CHOLHDL 4.5 09/19/2012    Lab Results HIV 1 RNA Quant (copies/mL)  Date Value  01/28/2013 <20   12/05/2012 <20   09/19/2012 41785*     CD4 T Cell Abs (cmm)  Date Value  01/28/2013 200*  12/05/2012 130*  09/19/2012 150*     Assessment: Her adherence has been excellent. Her last viral loads have been undetectable and her CD4 count was reconstituted and when  last checked in August. We've called her pharmacy and we'll make sure that she can pick up her Stribild later today.  I encouraged her to go through with her appointments the [place].  I will do screening labs for possible hepatitis C treatment soon.  I talked to her at length about the importance of cigarette cessation. She is willing to consider establishing a plan to quit and quit date in the near future.  Plan: 1. Check lab work today 2. Continue Stribild 3. Cigarette cessation counseling provided 4. Followup in one month   Michel Bickers, MD [facility] for [place] [phone] pager   [phone] cell 07/14/2013, 2:31 PM

## 2013-07-15 LAB — HIV-1 RNA QUANT-NO REFLEX-BLD
HIV 1 RNA Quant: <20 copies/mL (ref <20)
HIV-1 RNA Quant, Log: <1.3 {Log} (ref <1.30)

## 2013-07-15 LAB — HEPATITIS B SURFACE ANTIBODY,QUALITATIVE: Hep B S Ab: NEGATIVE

## 2013-07-15 LAB — ANA: ANA: NEGATIVE

## 2013-07-15 LAB — RPR

## 2013-07-15 LAB — HEPATITIS A ANTIBODY, TOTAL: Hep A Total Ab: BORDERLINE — AB

## 2013-07-15 LAB — HEPATITIS B SURFACE ANTIGEN: Hepatitis B Surface Ag: NEGATIVE

## 2013-07-15 NOTE — Addendum Note (Signed)
Addended by: Dolan Amen D on: 07/15/2013 08:29 AM   Modules accepted: Orders

## 2013-07-16 ENCOUNTER — Telehealth: Payer: Self-pay | Admitting: *Deleted

## 2013-07-16 LAB — T-HELPER CELL (CD4) - (RCID CLINIC ONLY)
CD4 % Helper T Cell: 28 % — ABNORMAL LOW (ref 33–55)
CD4 T Cell Abs: 150 /uL — ABNORMAL LOW (ref 400–2700)

## 2013-07-16 NOTE — Telephone Encounter (Signed)
Pt needs to make transportation arrangements to come to [facility] to have this blood work drawn.  Will call back to schedule lab visit when she has transportation arranged.

## 2013-07-17 LAB — HEPATITIS C GENOTYPE

## 2013-07-17 LAB — HEPATITIS C RNA QUANTITATIVE
HCV Quantitative Log: 7.11 {Log} — ABNORMAL HIGH (ref <1.18)
HCV Quantitative: 12761761 IU/mL — ABNORMAL HIGH (ref <15)

## 2013-08-12 ENCOUNTER — Ambulatory Visit: Payer: Medicaid Other | Admitting: Internal Medicine

## 2013-08-21 ENCOUNTER — Ambulatory Visit: Payer: Medicaid Other | Admitting: Oncology

## 2013-08-21 ENCOUNTER — Other Ambulatory Visit: Payer: Medicaid Other

## 2013-08-21 ENCOUNTER — Telehealth: Payer: Self-pay | Admitting: *Deleted

## 2013-08-21 NOTE — Telephone Encounter (Signed)
Pt did not show up for office visit today. Orders entered to reschedule for next 1-2 mos.

## 2013-08-22 ENCOUNTER — Telehealth: Payer: Self-pay | Admitting: Oncology

## 2013-08-22 NOTE — Telephone Encounter (Signed)
s.w. pt and advised on April appt.....pt ok and aware °

## 2013-09-09 ENCOUNTER — Ambulatory Visit: Payer: Medicaid Other | Admitting: Internal Medicine

## 2013-09-24 ENCOUNTER — Encounter: Payer: Self-pay | Admitting: *Deleted

## 2013-10-16 ENCOUNTER — Ambulatory Visit: Payer: Medicaid Other | Admitting: Oncology

## 2013-10-16 ENCOUNTER — Other Ambulatory Visit: Payer: Medicaid Other

## 2014-01-01 ENCOUNTER — Other Ambulatory Visit: Payer: Self-pay | Admitting: *Deleted

## 2014-01-01 DIAGNOSIS — C182 Malignant neoplasm of ascending colon: Secondary | ICD-10-CM

## 2014-01-02 ENCOUNTER — Encounter: Payer: Self-pay | Admitting: *Deleted

## 2014-01-02 ENCOUNTER — Other Ambulatory Visit: Payer: Medicaid Other

## 2014-01-02 ENCOUNTER — Ambulatory Visit: Payer: Medicaid Other | Admitting: Oncology

## 2014-01-02 ENCOUNTER — Other Ambulatory Visit: Payer: Self-pay | Admitting: *Deleted

## 2014-01-02 NOTE — Progress Notes (Signed)
Pt did not show up for office visit. Order sent to schedulers to contact pt to reschedule.

## 2014-01-03 ENCOUNTER — Telehealth: Payer: Self-pay | Admitting: Oncology

## 2014-01-03 NOTE — Telephone Encounter (Signed)
S/w the pt and she is aware of her sept appt.

## 2014-01-16 ENCOUNTER — Emergency Department (HOSPITAL_COMMUNITY): Payer: Medicaid Other

## 2014-01-16 ENCOUNTER — Encounter (HOSPITAL_COMMUNITY): Payer: Self-pay | Admitting: Emergency Medicine

## 2014-01-16 ENCOUNTER — Emergency Department (HOSPITAL_COMMUNITY)
Admission: EM | Admit: 2014-01-16 | Discharge: 2014-01-16 | Disposition: A | Discharge status: Home / Self Care | Payer: Medicaid Other | Attending: Emergency Medicine | Admitting: Emergency Medicine

## 2014-01-16 ENCOUNTER — Encounter (HOSPITAL_BASED_OUTPATIENT_CLINIC_OR_DEPARTMENT_OTHER): Payer: Self-pay | Admitting: *Deleted

## 2014-01-16 DIAGNOSIS — Z79899 Other long term (current) drug therapy: Secondary | ICD-10-CM | POA: Insufficient documentation

## 2014-01-16 DIAGNOSIS — Y92009 Unspecified place in unspecified non-institutional (private) residence as the place of occurrence of the external cause: Secondary | ICD-10-CM | POA: Insufficient documentation

## 2014-01-16 DIAGNOSIS — Z21 Asymptomatic human immunodeficiency virus [HIV] infection status: Secondary | ICD-10-CM | POA: Insufficient documentation

## 2014-01-16 DIAGNOSIS — F17200 Nicotine dependence, unspecified, uncomplicated: Secondary | ICD-10-CM | POA: Diagnosis not present

## 2014-01-16 DIAGNOSIS — S62102A Fracture of unspecified carpal bone, left wrist, initial encounter for closed fracture: Secondary | ICD-10-CM

## 2014-01-16 DIAGNOSIS — S6990XA Unspecified injury of unspecified wrist, hand and finger(s), initial encounter: Secondary | ICD-10-CM

## 2014-01-16 DIAGNOSIS — Z8619 Personal history of other infectious and parasitic diseases: Secondary | ICD-10-CM | POA: Insufficient documentation

## 2014-01-16 DIAGNOSIS — S59909A Unspecified injury of unspecified elbow, initial encounter: Secondary | ICD-10-CM | POA: Diagnosis present

## 2014-01-16 DIAGNOSIS — S59919A Unspecified injury of unspecified forearm, initial encounter: Secondary | ICD-10-CM

## 2014-01-16 DIAGNOSIS — R296 Repeated falls: Secondary | ICD-10-CM | POA: Insufficient documentation

## 2014-01-16 DIAGNOSIS — S62109A Fracture of unspecified carpal bone, unspecified wrist, initial encounter for closed fracture: Secondary | ICD-10-CM | POA: Insufficient documentation

## 2014-01-16 DIAGNOSIS — Y939 Activity, unspecified: Secondary | ICD-10-CM | POA: Insufficient documentation

## 2014-01-16 HISTORY — DX: Malignant (primary) neoplasm, unspecified: C80.1

## 2014-01-16 MED ORDER — HYDROMORPHONE HCL PF 1 MG/ML IJ SOLN
1.0000 mg | Freq: Once | INTRAMUSCULAR | Status: AC
  Administered 2014-01-16: 1 mg via INTRAMUSCULAR
  Filled 2014-01-16: qty 1

## 2014-01-16 MED ORDER — OXYCODONE-ACETAMINOPHEN 5-325 MG PO TABS
2.0000 | ORAL_TABLET | Freq: Once | ORAL | Status: AC
  Administered 2014-01-16: 2 via ORAL
  Filled 2014-01-16: qty 2

## 2014-01-16 MED ORDER — HYDROCODONE-ACETAMINOPHEN 5-325 MG PO TABS: 1.0000 | ORAL_TABLET | Freq: Four times a day (QID) | ORAL | Status: DC | PRN

## 2014-01-16 NOTE — ED Notes (Signed)
Pt. lost her balance and fell at home this evening , presents with left wrist pain and swelling .

## 2014-01-16 NOTE — ED Provider Notes (Signed)
CSN: [id number]     Arrival date & time 01/16/14  0220 History   First MD Initiated Contact with Patient 01/16/14 [phone]     Chief Complaint  Patient presents with  . Wrist Injury     (Consider location/radiation/quality/duration/timing/severity/associated sxs/prior Treatment) HPI Comments: Pt comes in with cc of fall. Pt had a mechanical fall, resulting in FOOSh injury to her left arm. She has no pain anywhere, but to her left arm and wrist. No numbness, tingling. Not on anticoagulants.  Patient is a 63 y.o. female presenting with wrist injury. The history is provided by the patient.  Wrist Injury   Past Medical History  Diagnosis Date  . HIV (human immunodeficiency virus infection)   . Hepatitis C   . Cancer    Past Surgical History  Procedure Laterality Date  . Colonoscopy N/A 09/20/2012    Procedure: COLONOSCOPY;  Surgeon: Beryle Beams, MD;  Location: [facility];  Service: Endoscopy;  Laterality: N/A;  . Colon resection N/A 09/23/2012    Procedure: LAPAROSCOPIC RIGHT COLON RESECTION   ;  Surgeon: Edward Jolly, MD;  Location: [facility];  Service: General;  Laterality: N/A;  LAPOROSCOPY, RIGHT HEMICOLECTOMY  . Colon surgery  09/23/2012    lap right colectomy   Family History  Problem Relation Age of Onset  . Cancer Mother    History  Substance Use Topics  . Smoking status: Current Every Day Smoker -- 0.30 packs/day    Types: Cigarettes  . Smokeless tobacco: Not on file     Comment: trying to cut back  . Alcohol Use: No   OB History   Grav Para Term Preterm Abortions TAB SAB Ect Mult Living                 Review of Systems  Respiratory: Negative for shortness of breath.   Cardiovascular: Negative for chest pain.  Musculoskeletal: Positive for myalgias.  Skin: Negative for rash and wound.  Neurological: Negative for headaches.  Hematological: Does not bruise/bleed easily.      Allergies  Review of patient's allergies indicates no known  allergies.  Home Medications   Prior to Admission medications   Medication Sig Start Date End Date Taking? Authorizing Provider  elvitegravir-cobicistat-emtricitabine-tenofovir (STRIBILD) 150-150-200-300 MG TABS Take 1 tablet by mouth every evening.  10/15/12  Yes Michel Bickers, MD  oxyCODONE-acetaminophen (PERCOCET/ROXICET) 5-325 MG per tablet Take by mouth every 4 (four) hours as needed for severe pain.    Historical Provider, MD   BP 141/79  Pulse 71  Temp(Src) 98.4 F (36.9 C) (Oral)  Resp 15  SpO2 99% Physical Exam  Nursing note and vitals reviewed. Constitutional: She appears well-developed.  HENT:  Head: Atraumatic.  Eyes: Conjunctivae are normal.  Neck: Neck supple.  Cardiovascular: Normal rate.   Pulmonary/Chest: Effort normal.  Musculoskeletal:  Left wrist is swollen, edematous and there is some deformity. 2+ DP, and sensation over all her digits is normal. No tenderness over the scaphoid.  Skin: Skin is warm.    ED Course  Procedures (including critical care time) Labs Review Labs Reviewed - No data to display  Imaging Review Dg Wrist Complete Left  01/16/2014   CLINICAL DATA:  Fall, wrist pain.  EXAM: LEFT WRIST - COMPLETE 3+ VIEW  COMPARISON:  None.  FINDINGS: Comminuted intra-articular distal radial fracture with mild dorsal angulation of the distal bony fragments. No dislocation. Mildly distracted nondisplaced ulnar styloid fracture. No destructive bony lesions. Wrist soft tissue swelling without subcutaneous gas  or radiopaque foreign bodies.  IMPRESSION: Comminuted mildly displaced distal radial fracture, nondisplaced ulnar styloid fracture. No dislocation.   Electronically Signed   By: Elon Alas   On: 01/16/2014 02:51     EKG Interpretation None      MDM   Final diagnoses:  Wrist fracture, left, closed, initial encounter    Pt with closed wrist fx. Elbow exam is normal, and so is the proximal radial, ulnar exam and the clavicle exam. No  other injuries. Will get sugar tong and advise Hand f/u.   Varney Biles, MD 01/16/14 2322

## 2014-01-16 NOTE — ED Notes (Signed)
Ortho Tech at bedside.  

## 2014-01-16 NOTE — Progress Notes (Signed)
Orthopedic Tech Progress Note Patient Details:  ILAMAE GENG 1950-10-14 [id number]  Ortho Devices Type of Ortho Device: Arm sling;Sugartong splint Ortho Device/Splint Location: sugartong splint is applied to the left upper extremity. splint care is gone verbally. a sling is left in room for patient use once she is dressed. Ortho Device/Splint Interventions: Application   Ashok Cordia 01/16/2014, 5:08 AM

## 2014-01-16 NOTE — Discharge Instructions (Signed)
You have wrist fracture. See the hand doctor as requested in 1 week to ensure you are healing well. RETURN TO THE ER IF THE PAIN GETS SEVERELY WORSE OR GETS COMPLETELY NUMB.  Cast or Splint Care Casts and splints support injured limbs and keep bones from moving while they heal. It is important to care for your cast or splint at home.  HOME CARE INSTRUCTIONS  Keep the cast or splint uncovered during the drying period. It can take 24 to 48 hours to dry if it is made of plaster. A fiberglass cast will dry in less than 1 hour.  Do not rest the cast on anything harder than a pillow for the first 24 hours.  Do not put weight on your injured limb or apply pressure to the cast until your health care provider gives you permission.  Keep the cast or splint dry. Wet casts or splints can lose their shape and may not support the limb as well. A wet cast that has lost its shape can also create harmful pressure on your skin when it dries. Also, wet skin can become infected.  Cover the cast or splint with a plastic bag when bathing or when out in the rain or snow. If the cast is on the trunk of the body, take sponge baths until the cast is removed.  If your cast does become wet, dry it with a towel or a blow dryer on the cool setting only.  Keep your cast or splint clean. Soiled casts may be wiped with a moistened cloth.  Do not place any hard or soft foreign objects under your cast or splint, such as cotton, toilet paper, lotion, or powder.  Do not try to scratch the skin under the cast with any object. The object could get stuck inside the cast. Also, scratching could lead to an infection. If itching is a problem, use a blow dryer on a cool setting to relieve discomfort.  Do not trim or cut your cast or remove padding from inside of it.  Exercise all joints next to the injury that are not immobilized by the cast or splint. For example, if you have a long leg cast, exercise the hip joint and toes. If  you have an arm cast or splint, exercise the shoulder, elbow, thumb, and fingers.  Elevate your injured arm or leg on 1 or 2 pillows for the first 1 to 3 days to decrease swelling and pain.It is best if you can comfortably elevate your cast so it is higher than your heart. SEEK MEDICAL CARE IF:   Your cast or splint cracks.  Your cast or splint is too tight or too loose.  You have unbearable itching inside the cast.  Your cast becomes wet or develops a soft spot or area.  You have a bad smell coming from inside your cast.  You get an object stuck under your cast.  Your skin around the cast becomes red or raw.  You have new pain or worsening pain after the cast has been applied. SEEK IMMEDIATE MEDICAL CARE IF:   You have fluid leaking through the cast.  You are unable to move your fingers or toes.  You have discolored (blue or white), cool, painful, or very swollen fingers or toes beyond the cast.  You have tingling or numbness around the injured area.  You have severe pain or pressure under the cast.  You have any difficulty with your breathing or have shortness of breath.  You have chest pain. Document Released: 06/09/2000 Document Revised: 04/02/2013 Document Reviewed: 12/19/2012 [facility] Patient Information 2015 [place], [place]. This information is not intended to replace advice given to you by your health care provider. Make sure you discuss any questions you have with your health care provider. Wrist Fracture A wrist fracture is a break or crack in one of the bones of your wrist. Your wrist is made up of eight small bones at the palm of your hand (carpal bones) and two long bones that make up your forearm (radius and ulna).  CAUSES   A direct blow to the wrist.  Falling on an outstretched hand.  Trauma, such as a car accident or a fall. RISK FACTORS Risk factors for wrist fracture include:   Participating in contact and high-risk sports, such as skiing, biking,  and ice skating.  Taking steroid medicines.  Smoking.  Being female.  Being Caucasian.  Drinking more than three alcoholic beverages per day.  Having low or lowered bone density (osteoporosis or osteopenia).  Age. Older adults have decreased bone density.  Women who have had menopause.  History of previous fractures. SIGNS AND SYMPTOMS Symptoms of wrist fractures include tenderness, bruising, and inflammation. Additionally, the wrist may hang in an odd position or appear deformed.  DIAGNOSIS Diagnosis may include:  Physical exam.  X-ray. TREATMENT Treatment depends on many factors, including the nature and location of the fracture, your age, and your activity level. Treatment for wrist fracture can be nonsurgical or surgical.  Nonsurgical Treatment A plaster cast or splint may be applied to your wrist if the bone is in a good position. If the fracture is not in good position, it may be necessary for your health care provider to realign it before applying a splint or cast. Usually, a cast or splint will be worn for several weeks.  Surgical Treatment Sometimes the position of the bone is so far out of place that surgery is required to apply a device to hold it together as it heals. Depending on the fracture, there are a number of options for holding the bone in place while it heals, such as a cast and metal pins.  HOME CARE INSTRUCTIONS  Keep your injured wrist elevated and move your fingers as much as possible.  Do not put pressure on any part of your cast or splint. It may break.   Use a plastic bag to protect your cast or splint from water while bathing or showering. Do not lower your cast or splint into water.  Take medicines only as directed by your health care provider.  Keep your cast or splint clean and dry. If it becomes wet, damaged, or suddenly feels too tight, contact your health care provider right away.  Do not use any tobacco products including cigarettes,  chewing tobacco, or electronic cigarettes. Tobacco can delay bone healing. If you need help quitting, ask your health care provider.  Keep all follow-up visits as directed by your health care provider. This is important.  Ask your health care provider if you should take supplements of calcium and vitamins C and D to promote bone healing. SEEK MEDICAL CARE IF:   Your cast or splint is damaged, breaks, or gets wet.  You have a fever.  You have chills.  You have continued severe pain or more swelling than you did before the cast was put on. SEEK IMMEDIATE MEDICAL CARE IF:   Your hand or fingernails on the injured arm turn blue or gray,  or feel cold or numb.  You have decreased feeling in the fingers of your injured arm. MAKE SURE YOU:  Understand these instructions.  Will watch your condition.  Will get help right away if you are not doing well or get worse. Document Released: 03/22/2005 Document Revised: 10/27/2013 Document Reviewed: 06/30/2011 [facility] Patient Information 2015 [place], [place]. This information is not intended to replace advice given to you by your health care provider. Make sure you discuss any questions you have with your health care provider. RICE: Routine Care for Injuries The routine care of many injuries includes Rest, Ice, Compression, and Elevation (RICE). HOME CARE INSTRUCTIONS  Rest is needed to allow your body to heal. Routine activities can usually be resumed when comfortable. Injured tendons and bones can take up to 6 weeks to heal. Tendons are the cord-like structures that attach muscle to bone.  Ice following an injury helps keep the swelling down and reduces pain.  Put ice in a plastic bag.  Place a towel between your skin and the bag.  Leave the ice on for 15-20 minutes, 3-4 times a day, or as directed by your health care provider. Do this while awake, for the first 24 to 48 hours. After that, continue as directed by your  caregiver.  Compression helps keep swelling down. It also gives support and helps with discomfort. If an elastic bandage has been applied, it should be removed and reapplied every 3 to 4 hours. It should not be applied tightly, but firmly enough to keep swelling down. Watch fingers or toes for swelling, bluish discoloration, coldness, numbness, or excessive pain. If any of these problems occur, remove the bandage and reapply loosely. Contact your caregiver if these problems continue.  Elevation helps reduce swelling and decreases pain. With extremities, such as the arms, hands, legs, and feet, the injured area should be placed near or above the level of the heart, if possible. SEEK IMMEDIATE MEDICAL CARE IF:  You have persistent pain and swelling.  You develop redness, numbness, or unexpected weakness.  Your symptoms are getting worse rather than improving after several days. These symptoms may indicate that further evaluation or further X-rays are needed. Sometimes, X-rays may not show a small broken bone (fracture) until 1 week or 10 days later. Make a follow-up appointment with your caregiver. Ask when your X-ray results will be ready. Make sure you get your X-ray results. Document Released: 09/24/2000 Document Revised: 06/17/2013 Document Reviewed: 11/11/2010 [facility] Patient Information 2015 [place], [place]. This information is not intended to replace advice given to you by your health care provider. Make sure you discuss any questions you have with your health care provider.

## 2014-01-16 NOTE — Progress Notes (Signed)
In ER 01/16/14 pm-no labs needed

## 2014-01-16 NOTE — ED Notes (Addendum)
Phoned Ortho Tech to apply a Sugar Tone to pt Left wrist to the elbow.

## 2014-01-19 ENCOUNTER — Encounter (HOSPITAL_BASED_OUTPATIENT_CLINIC_OR_DEPARTMENT_OTHER): Payer: Self-pay | Admitting: Anesthesiology

## 2014-01-19 ENCOUNTER — Ambulatory Visit (HOSPITAL_BASED_OUTPATIENT_CLINIC_OR_DEPARTMENT_OTHER)
Admission: RE | Admit: 2014-01-19 | Discharge: 2014-01-19 | Disposition: A | Discharge status: Home / Self Care | Payer: Medicaid Other | Source: Ambulatory Visit | Attending: Orthopedic Surgery | Admitting: Orthopedic Surgery

## 2014-01-19 ENCOUNTER — Encounter (HOSPITAL_BASED_OUTPATIENT_CLINIC_OR_DEPARTMENT_OTHER): Payer: Medicaid Other | Admitting: Anesthesiology

## 2014-01-19 ENCOUNTER — Encounter (HOSPITAL_BASED_OUTPATIENT_CLINIC_OR_DEPARTMENT_OTHER)
Admission: RE | Disposition: A | Discharge status: Home / Self Care | Payer: Self-pay | Source: Ambulatory Visit | Attending: Orthopedic Surgery

## 2014-01-19 ENCOUNTER — Ambulatory Visit (HOSPITAL_BASED_OUTPATIENT_CLINIC_OR_DEPARTMENT_OTHER): Payer: Medicaid Other | Admitting: Anesthesiology

## 2014-01-19 DIAGNOSIS — Z21 Asymptomatic human immunodeficiency virus [HIV] infection status: Secondary | ICD-10-CM | POA: Diagnosis not present

## 2014-01-19 DIAGNOSIS — S52502A Unspecified fracture of the lower end of left radius, initial encounter for closed fracture: Secondary | ICD-10-CM

## 2014-01-19 DIAGNOSIS — S52599A Other fractures of lower end of unspecified radius, initial encounter for closed fracture: Secondary | ICD-10-CM | POA: Insufficient documentation

## 2014-01-19 DIAGNOSIS — Z720 Tobacco use: Secondary | ICD-10-CM

## 2014-01-19 DIAGNOSIS — W19XXXA Unspecified fall, initial encounter: Secondary | ICD-10-CM | POA: Diagnosis not present

## 2014-01-19 DIAGNOSIS — B1920 Unspecified viral hepatitis C without hepatic coma: Secondary | ICD-10-CM | POA: Insufficient documentation

## 2014-01-19 DIAGNOSIS — C182 Malignant neoplasm of ascending colon: Secondary | ICD-10-CM

## 2014-01-19 DIAGNOSIS — B20 Human immunodeficiency virus [HIV] disease: Secondary | ICD-10-CM

## 2014-01-19 DIAGNOSIS — Z8719 Personal history of other diseases of the digestive system: Secondary | ICD-10-CM

## 2014-01-19 HISTORY — PX: CARPAL TUNNEL RELEASE: SHX101

## 2014-01-19 HISTORY — PX: OPEN REDUCTION INTERNAL FIXATION (ORIF) DISTAL RADIAL FRACTURE: SHX5989

## 2014-01-19 SURGERY — OPEN REDUCTION INTERNAL FIXATION (ORIF) DISTAL RADIUS FRACTURE
Anesthesia: General | Laterality: Left

## 2014-01-19 MED ORDER — BUPIVACAINE HCL (PF) 0.25 % IJ SOLN
INTRAMUSCULAR | Status: DC | PRN
  Administered 2014-01-19: 18 mL

## 2014-01-19 MED ORDER — MIDAZOLAM HCL 2 MG/2ML IJ SOLN
INTRAMUSCULAR | Status: AC
  Filled 2014-01-19: qty 2

## 2014-01-19 MED ORDER — BUPIVACAINE-EPINEPHRINE (PF) 0.25% -1:200000 IJ SOLN
INTRAMUSCULAR | Status: AC
  Filled 2014-01-19: qty 30

## 2014-01-19 MED ORDER — MIDAZOLAM HCL 5 MG/5ML IJ SOLN
INTRAMUSCULAR | Status: DC | PRN
  Administered 2014-01-19: 2 mg via INTRAVENOUS

## 2014-01-19 MED ORDER — HYDROMORPHONE HCL PF 1 MG/ML IJ SOLN
0.2500 mg | INTRAMUSCULAR | Status: DC | PRN
Start: 2014-01-19 — End: 2014-01-19
  Administered 2014-01-19 (×4): 0.5 mg via INTRAVENOUS

## 2014-01-19 MED ORDER — MIDAZOLAM HCL 2 MG/2ML IJ SOLN
1.0000 mg | INTRAMUSCULAR | Status: DC | PRN
  Administered 2014-01-19: 2 mg via INTRAVENOUS

## 2014-01-19 MED ORDER — OXYCODONE HCL 5 MG PO TABS
5.0000 mg | ORAL_TABLET | Freq: Once | ORAL | Status: AC | PRN
  Administered 2014-01-19: 5 mg via ORAL

## 2014-01-19 MED ORDER — OXYCODONE-ACETAMINOPHEN 5-325 MG PO TABS: 1.0000 | ORAL_TABLET | ORAL | Status: DC | PRN

## 2014-01-19 MED ORDER — MORPHINE SULFATE 10 MG/ML IJ SOLN
INTRAMUSCULAR | Status: AC
  Filled 2014-01-19: qty 1

## 2014-01-19 MED ORDER — LIDOCAINE HCL (PF) 1 % IJ SOLN
INTRAMUSCULAR | Status: AC
  Filled 2014-01-19: qty 30

## 2014-01-19 MED ORDER — OXYCODONE HCL 5 MG/5ML PO SOLN: 5.0000 mg | Freq: Once | ORAL | Status: AC | PRN

## 2014-01-19 MED ORDER — OXYCODONE HCL 5 MG PO TABS
ORAL_TABLET | ORAL | Status: AC
  Filled 2014-01-19: qty 1

## 2014-01-19 MED ORDER — HYDROMORPHONE HCL PF 1 MG/ML IJ SOLN
INTRAMUSCULAR | Status: AC
  Filled 2014-01-19: qty 1

## 2014-01-19 MED ORDER — DEXAMETHASONE SODIUM PHOSPHATE 4 MG/ML IJ SOLN
INTRAMUSCULAR | Status: DC | PRN
  Administered 2014-01-19: 8 mg via INTRAVENOUS

## 2014-01-19 MED ORDER — FENTANYL CITRATE 0.05 MG/ML IJ SOLN
INTRAMUSCULAR | Status: AC
  Filled 2014-01-19: qty 2

## 2014-01-19 MED ORDER — PROPOFOL 10 MG/ML IV BOLUS
INTRAVENOUS | Status: DC | PRN
  Administered 2014-01-19: 140 mg via INTRAVENOUS

## 2014-01-19 MED ORDER — CEFAZOLIN SODIUM-DEXTROSE 2-3 GM-% IV SOLR
INTRAVENOUS | Status: DC | PRN
  Administered 2014-01-19: 2 g via INTRAVENOUS

## 2014-01-19 MED ORDER — BUPIVACAINE HCL (PF) 0.25 % IJ SOLN
INTRAMUSCULAR | Status: AC
  Filled 2014-01-19: qty 60

## 2014-01-19 MED ORDER — CEFAZOLIN SODIUM-DEXTROSE 2-3 GM-% IV SOLR
INTRAVENOUS | Status: AC
  Filled 2014-01-19: qty 50

## 2014-01-19 MED ORDER — LACTATED RINGERS IV SOLN
INTRAVENOUS | Status: DC
  Administered 2014-01-19: 11:00:00 via INTRAVENOUS
  Administered 2014-01-19: 10 mL/h via INTRAVENOUS

## 2014-01-19 MED ORDER — SUCCINYLCHOLINE CHLORIDE 20 MG/ML IJ SOLN
INTRAMUSCULAR | Status: DC | PRN
  Administered 2014-01-19: 100 mg via INTRAVENOUS

## 2014-01-19 MED ORDER — FENTANYL CITRATE 0.05 MG/ML IJ SOLN
50.0000 ug | INTRAMUSCULAR | Status: DC | PRN
  Administered 2014-01-19: 100 ug via INTRAVENOUS

## 2014-01-19 MED ORDER — MORPHINE SULFATE 10 MG/ML IJ SOLN
INTRAMUSCULAR | Status: DC | PRN
  Administered 2014-01-19: 2 mg via INTRAVENOUS
  Administered 2014-01-19: 4 mg via INTRAVENOUS
  Administered 2014-01-19: 1 mg via INTRAVENOUS
  Administered 2014-01-19: 2 mg via INTRAVENOUS
  Administered 2014-01-19: 1 mg via INTRAVENOUS

## 2014-01-19 MED ORDER — FENTANYL CITRATE 0.05 MG/ML IJ SOLN
INTRAMUSCULAR | Status: AC
  Filled 2014-01-19: qty 6

## 2014-01-19 MED ORDER — FENTANYL CITRATE 0.05 MG/ML IJ SOLN
INTRAMUSCULAR | Status: DC | PRN
  Administered 2014-01-19: 100 ug via INTRAVENOUS
  Administered 2014-01-19 (×2): 50 ug via INTRAVENOUS

## 2014-01-19 MED ORDER — ONDANSETRON HCL 4 MG/2ML IJ SOLN
INTRAMUSCULAR | Status: DC | PRN
  Administered 2014-01-19: 4 mg via INTRAVENOUS

## 2014-01-19 SURGICAL SUPPLY — 76 items
APL SKNCLS STERI-STRIP NONHPOA (GAUZE/BANDAGES/DRESSINGS) ×1
BAG DECANTER FOR FLEXI CONT (MISCELLANEOUS) IMPLANT
BANDAGE ELASTIC 3 VELCRO ST LF (GAUZE/BANDAGES/DRESSINGS) IMPLANT
BANDAGE ELASTIC 4 VELCRO ST LF (GAUZE/BANDAGES/DRESSINGS) ×2 IMPLANT
BENZOIN TINCTURE PRP APPL 2/3 (GAUZE/BANDAGES/DRESSINGS) ×2 IMPLANT
BIT DRILL 2 FAST STEP (BIT) ×1 IMPLANT
BIT DRILL 2.5X4 QC (BIT) ×1 IMPLANT
BLADE MINI RND TIP GREEN BEAV (BLADE) IMPLANT
BLADE SURG 15 STRL LF DISP TIS (BLADE) ×2 IMPLANT
BLADE SURG 15 STRL SS (BLADE) ×4
BNDG CMPR 9X4 STRL LF SNTH (GAUZE/BANDAGES/DRESSINGS)
BNDG ESMARK 4X9 LF (GAUZE/BANDAGES/DRESSINGS) IMPLANT
BNDG GAUZE ELAST 4 BULKY (GAUZE/BANDAGES/DRESSINGS) ×2 IMPLANT
CANISTER SUCT 1200ML W/VALVE (MISCELLANEOUS) IMPLANT
CORDS BIPOLAR (ELECTRODE) ×2 IMPLANT
COVER TABLE BACK 60X90 (DRAPES) ×2 IMPLANT
CUFF TOURNIQUET SINGLE 18IN (TOURNIQUET CUFF) IMPLANT
DECANTER SPIKE VIAL GLASS SM (MISCELLANEOUS) IMPLANT
DRAPE EXTREMITY T 121X128X90 (DRAPE) ×2 IMPLANT
DRAPE OEC MINIVIEW 54X84 (DRAPES) ×2 IMPLANT
DRAPE SURG 17X23 STRL (DRAPES) ×2 IMPLANT
DURAPREP 26ML APPLICATOR (WOUND CARE) ×2 IMPLANT
ELECT REM PT RETURN 9FT ADLT (ELECTROSURGICAL)
ELECTRODE REM PT RTRN 9FT ADLT (ELECTROSURGICAL) IMPLANT
GAUZE SPONGE 4X4 12PLY STRL (GAUZE/BANDAGES/DRESSINGS) ×2 IMPLANT
GAUZE SPONGE 4X4 16PLY XRAY LF (GAUZE/BANDAGES/DRESSINGS) IMPLANT
GAUZE XEROFORM 1X8 LF (GAUZE/BANDAGES/DRESSINGS) IMPLANT
GLOVE BIO SURGEON STRL SZ 6.5 (GLOVE) ×1 IMPLANT
GLOVE BIOGEL M STRL SZ7.5 (GLOVE) ×1 IMPLANT
GLOVE BIOGEL PI IND STRL 8 (GLOVE) IMPLANT
GLOVE BIOGEL PI INDICATOR 8 (GLOVE) ×1
GLOVE SURG SYN 8.0 (GLOVE) ×6 IMPLANT
GLOVE SURG SYN 8.0 PF PI (GLOVE) ×2 IMPLANT
GOWN STRL REUS W/ TWL LRG LVL3 (GOWN DISPOSABLE) ×1 IMPLANT
GOWN STRL REUS W/TWL LRG LVL3 (GOWN DISPOSABLE) ×2
GOWN STRL REUS W/TWL XL LVL3 (GOWN DISPOSABLE) ×2 IMPLANT
NDL HYPO 25X1 1.5 SAFETY (NEEDLE) ×1 IMPLANT
NEEDLE HYPO 25X1 1.5 SAFETY (NEEDLE) ×2 IMPLANT
NS IRRIG 1000ML POUR BTL (IV SOLUTION) ×2 IMPLANT
PACK BASIN DAY SURGERY FS (CUSTOM PROCEDURE TRAY) ×2 IMPLANT
PAD CAST 3X4 CTTN HI CHSV (CAST SUPPLIES) ×1 IMPLANT
PAD CAST 4YDX4 CTTN HI CHSV (CAST SUPPLIES) IMPLANT
PADDING CAST ABS 4INX4YD NS (CAST SUPPLIES) ×1
PADDING CAST ABS COTTON 4X4 ST (CAST SUPPLIES) ×1 IMPLANT
PADDING CAST COTTON 3X4 STRL (CAST SUPPLIES) ×2
PADDING CAST COTTON 4X4 STRL (CAST SUPPLIES)
PEG SUBCHONDRAL SMOOTH 2.0X20 (Peg) ×5 IMPLANT
PEG SUBCHONDRAL SMOOTH 2.0X24 (Peg) ×2 IMPLANT
PENCIL BUTTON HOLSTER BLD 10FT (ELECTRODE) IMPLANT
PLATE STAN 24.4X59.5 LT (Plate) ×1 IMPLANT
SCREW BN 12X3.5XNS CORT TI (Screw) IMPLANT
SCREW CORT 3.5X10 LNG (Screw) ×1 IMPLANT
SCREW CORT 3.5X12 (Screw) ×2 IMPLANT
SCREW CORT 3.5X14 LNG (Screw) ×1 IMPLANT
SHEET MEDIUM DRAPE 40X70 STRL (DRAPES) ×2 IMPLANT
SPLINT PLASTER CAST XFAST 3X15 (CAST SUPPLIES) IMPLANT
SPLINT PLASTER CAST XFAST 4X15 (CAST SUPPLIES) ×5 IMPLANT
SPLINT PLASTER XTRA FAST SET 4 (CAST SUPPLIES) ×5
SPLINT PLASTER XTRA FASTSET 3X (CAST SUPPLIES)
STOCKINETTE 4X48 STRL (DRAPES) ×2 IMPLANT
STRIP CLOSURE SKIN 1/2X4 (GAUZE/BANDAGES/DRESSINGS) ×2 IMPLANT
SUCTION FRAZIER TIP 10 FR DISP (SUCTIONS) IMPLANT
SUT ETHILON 4 0 PS 2 18 (SUTURE) IMPLANT
SUT MERSILENE 4 0 P 3 (SUTURE) IMPLANT
SUT PROLENE 3 0 PS 2 (SUTURE) ×2 IMPLANT
SUT SILK 2 0 FS (SUTURE) IMPLANT
SUT VIC AB 0 SH 27 (SUTURE) ×2 IMPLANT
SUT VIC AB 3-0 FS2 27 (SUTURE) IMPLANT
SUT VIC AB 4-0 RB1 18 (SUTURE) ×2 IMPLANT
SUT VICRYL RAPIDE 4-0 (SUTURE) IMPLANT
SUT VICRYL RAPIDE 4/0 PS 2 (SUTURE) IMPLANT
SYR BULB 3OZ (MISCELLANEOUS) ×2 IMPLANT
SYRINGE 12CC LL (MISCELLANEOUS) ×2 IMPLANT
TOWEL OR 17X24 6PK STRL BLUE (TOWEL DISPOSABLE) ×2 IMPLANT
TUBE CONNECTING 20X1/4 (TUBING) IMPLANT
UNDERPAD 30X30 INCONTINENT (UNDERPADS AND DIAPERS) ×2 IMPLANT

## 2014-01-19 NOTE — Progress Notes (Signed)
R External jugular placed by Dr Ola Spurr, Sedation given for splint removal, pt tolerated wee

## 2014-01-19 NOTE — Anesthesia Postprocedure Evaluation (Signed)
  Anesthesia Post-op Note  Patient: Ruth Stephenson  Procedure(s) Performed: Procedure(s): OPEN REDUCTION INTERNAL FIXATION (ORIF) LEFT  DISTAL RADIAL FRACTURE (Left) LEFT CARPAL TUNNEL RELEASE (Left)  Patient Location: PACU  Anesthesia Type:General  Level of Consciousness: awake, alert  and oriented  Airway and Oxygen Therapy: Patient Spontanous Breathing  Post-op Pain: mild  Post-op Assessment: Post-op Vital signs reviewed  Post-op Vital Signs: Reviewed  Last Vitals:  Filed Vitals:   01/19/14 1400  BP: 151/71  Pulse: 74  Temp:   Resp: 16    Complications: No apparent anesthesia complications

## 2014-01-19 NOTE — H&P (Signed)
Ruth Stephenson is an 63 y.o. female.   Chief Complaint: left wrist pain s/p fall HPI: as above with displaced left distal radius fracture  Past Medical History  Diagnosis Date  . HIV (human immunodeficiency virus infection)   . Hepatitis C   . Cancer     Past Surgical History  Procedure Laterality Date  . Colonoscopy N/A 09/20/2012    Procedure: COLONOSCOPY;  Surgeon: Beryle Beams, MD;  Location: [facility];  Service: Endoscopy;  Laterality: N/A;  . Colon resection N/A 09/23/2012    Procedure: LAPAROSCOPIC RIGHT COLON RESECTION   ;  Surgeon: Edward Jolly, MD;  Location: [facility];  Service: General;  Laterality: N/A;  LAPOROSCOPY, RIGHT HEMICOLECTOMY  . Colon surgery  09/23/2012    lap right colectomy    Family History  Problem Relation Age of Onset  . Cancer Mother    Social History:  reports that she has been smoking Cigarettes.  She has been smoking about 0.30 packs per day. She does not have any smokeless tobacco history on file. She reports that she does not drink alcohol or use illicit drugs.  Allergies: No Known Allergies  Medications Prior to Admission  Medication Sig Dispense Refill  . elvitegravir-cobicistat-emtricitabine-tenofovir (STRIBILD) 150-150-200-300 MG TABS Take 1 tablet by mouth every evening.       Marland Kitchen oxyCODONE-acetaminophen (PERCOCET/ROXICET) 5-325 MG per tablet Take by mouth every 4 (four) hours as needed for severe pain.        No results found for this or any previous visit (from the past 48 hour(s)). No results found.  Review of Systems  All other systems reviewed and are negative.   Blood pressure 120/69, pulse 55, temperature 98.5 F (36.9 C), temperature source Oral, resp. rate 13, height 5' 3.5" (1.613 m), weight 74.39 kg (164 lb), SpO2 100.00%. Physical Exam  Constitutional: She is oriented to person, place, and time. She appears well-developed and well-nourished.  HENT:  Head: Normocephalic and atraumatic.  Cardiovascular: Normal  rate.   Respiratory: Effort normal.  Musculoskeletal:  Displaced left distal radius fracrture  Neurological: She is alert and oriented to person, place, and time.  Skin: Skin is warm and dry.  Psychiatric: She has a normal mood and affect. Her behavior is normal. Judgment and thought content normal.     Assessment/Plan As above   Plan ORIF and CTR  Nickalos Petersen A 01/19/2014, 11:28 AM

## 2014-01-19 NOTE — Op Note (Signed)
See note [id number]

## 2014-01-19 NOTE — Discharge Instructions (Signed)

## 2014-01-19 NOTE — Anesthesia Procedure Notes (Addendum)
Performed by: Terrance Mass   Procedure Name: LMA Insertion Performed by: Terrance Mass Pre-anesthesia Checklist: Patient identified, Timeout performed, Emergency Drugs available, Suction available and Patient being monitored Patient Re-evaluated:Patient Re-evaluated prior to inductionOxygen Delivery Method: Circle system utilized Preoxygenation: Pre-oxygenation with 100% oxygen Intubation Type: IV induction Ventilation: Mask ventilation without difficulty LMA: LMA inserted LMA Size: 4.0 Placement Confirmation: positive ETCO2 Tube secured with: Tape Dental Injury: Teeth and Oropharynx as per pre-operative assessment

## 2014-01-19 NOTE — Transfer of Care (Signed)
Immediate Anesthesia Transfer of Care Note  Patient: Ruth Stephenson  Procedure(s) Performed: Procedure(s): OPEN REDUCTION INTERNAL FIXATION (ORIF) LEFT  DISTAL RADIAL FRACTURE (Left) LEFT CARPAL TUNNEL RELEASE (Left)  Patient Location: PACU  Anesthesia Type:General  Level of Consciousness: awake and patient cooperative  Airway & Oxygen Therapy: Patient Spontanous Breathing and Patient connected to face mask  Post-op Assessment: Report given to PACU RN and Post -op Vital signs reviewed and stable  Post vital signs: Reviewed and stable  Complications: No apparent anesthesia complications

## 2014-01-19 NOTE — Anesthesia Preprocedure Evaluation (Addendum)
Anesthesia Evaluation  Patient identified by MRN, date of birth, ID band Patient awake    Reviewed: Allergy & Precautions, H&P , NPO status , Patient's Chart, lab work & pertinent test results  Airway Mallampati: II TM Distance: >3 FB Neck ROM: Full    Dental no notable dental hx. (+) Upper Dentures, Lower Dentures, Dental Advisory Given   Pulmonary Current Smoker,  breath sounds clear to auscultation  Pulmonary exam normal       Cardiovascular negative cardio ROS  Rhythm:Regular Rate:Normal     Neuro/Psych negative neurological ROS  negative psych ROS   GI/Hepatic negative GI ROS, (+) Hepatitis -, C  Endo/Other  negative endocrine ROS  Renal/GU negative Renal ROS  negative genitourinary   Musculoskeletal   Abdominal   Peds  Hematology  (+) anemia , HIV,   Anesthesia Other Findings   Reproductive/Obstetrics negative OB ROS                         Anesthesia Physical Anesthesia Plan  ASA: III  Anesthesia Plan: General   Post-op Pain Management:    Induction: Intravenous  Airway Management Planned: Oral ETT  Additional Equipment:   Intra-op Plan:   Post-operative Plan: Extubation in OR  Informed Consent: I have reviewed the patients History and Physical, chart, labs and discussed the procedure including the risks, benefits and alternatives for the proposed anesthesia with the patient or authorized representative who has indicated his/her understanding and acceptance.   Dental advisory given  Plan Discussed with: CRNA  Anesthesia Plan Comments: (Pt. Declines block)      Anesthesia Quick Evaluation

## 2014-01-20 ENCOUNTER — Encounter (HOSPITAL_BASED_OUTPATIENT_CLINIC_OR_DEPARTMENT_OTHER): Payer: Self-pay | Admitting: Orthopedic Surgery

## 2014-01-21 NOTE — Op Note (Signed)
NAMEREID, NAWROT NO.:  [id number]  MEDICAL RECORD NO.:  [id number]  LOCATION:                                 FACILITY:  PHYSICIAN:  Sheral Apley. Helyn Schwan, M.D.DATE OF BIRTH:  [birth year]  DATE OF PROCEDURE:  01/19/2014 DATE OF DISCHARGE:  01/19/2014                              OPERATIVE REPORT   PREOPERATIVE DIAGNOSIS:  Displaced intra-articular fracture of distal radius, left side.  POSTOPERATIVE DIAGNOSIS:  Displaced intra-articular fracture of distal radius, left side.  PROCEDURES:  Open reduction and internal fixation of above.  DVR plate and screws, standard left plate.  Carpal tunnel release, brachioradialis release.  SURGEON:  Sheral Apley. Burney Gauze, M.D.  ASSISTANT:  Marily Lente Dasnoit, PA-C.  ANESTHESIA:  General.  No complication.  No drains.  DESCRIPTION OF PROCEDURE:  The patient was was taken to the operating suite after induction of adequate general anesthetic.  Left upper extremity was prepped and draped in sterile fashion.  An Esmarch was used to exsanguinate the limb.  Tourniquet was inflated to 250 mmHg.  At this point in time, incision was made over the palpable border of the flexor carpi radialis tendon.  Skin was incised sharply, 5-6 cm.  Sheath overlying the FCR was identified and incised.  The FCR was retracted to the midline, radial artery, lateral side.  This was developed down to the level of pronator quadratus.  The pronator quadratus was subperiosteally stripped off the distal radius revealing a displaced intra-articular fracture of the distal radius, left side three-four parts.  Brachioradialis was carefully incised and released off the distal styloid fragment to aid in reduction.  Once this was done, longitudinal traction, flexion, ulnar deviation were used to reduce the fracture.  A standard DVR plate was fastened to the volar aspect of the distal radius through the slotted hole.  Intraoperative fluoroscopy was then  used to determine adequate plate position followed by placement of two more cortical screws proximally followed by the smooth pegs distally.  Again, intraoperative fluoroscopy revealed adequate reduction in AP, lateral and oblique view.  The wound was thoroughly irrigated. The median nerve was identified in the proximal aspect of the wound, traced to the edge of the carpal canal proximally.  Palmar cutaneous branch of the median nerve was identified and retracted.  A Freer elevator was then used to create a path dorsal and volar to the transverse carpal ligament.  We then placed a Soil scientist on top of the median nerve and divided the transverse carpal ligament from proximal to distal.  The wound was then thoroughly irrigated and loosely closed in layers of 2-0 undyed Vicryl for the pronator quadratus to cover the plate subcutaneously and 3-0 Prolene subcuticular stitch on the skin.  Steri-Strips, 4x4s, fluffs, and a volar splint were applied.  The patient tolerated the procedure well and went to the recovery room in stable fashion.     Sheral Apley Burney Gauze, M.D.     MAW/MEDQ  D:  01/19/2014  T:  01/20/2014  Job:  [id number]

## 2014-03-10 ENCOUNTER — Telehealth: Payer: Self-pay | Admitting: Oncology

## 2014-03-10 ENCOUNTER — Other Ambulatory Visit (HOSPITAL_BASED_OUTPATIENT_CLINIC_OR_DEPARTMENT_OTHER): Payer: Medicaid Other

## 2014-03-10 ENCOUNTER — Ambulatory Visit (HOSPITAL_BASED_OUTPATIENT_CLINIC_OR_DEPARTMENT_OTHER): Payer: Medicaid Other | Admitting: Oncology

## 2014-03-10 ENCOUNTER — Other Ambulatory Visit: Payer: Medicaid Other

## 2014-03-10 VITALS — BP 117/71 | HR 58 | Temp 98.0°F | Resp 18 | Ht 63.5 in | Wt 174.2 lb

## 2014-03-10 DIAGNOSIS — C182 Malignant neoplasm of ascending colon: Secondary | ICD-10-CM

## 2014-03-10 DIAGNOSIS — F17200 Nicotine dependence, unspecified, uncomplicated: Secondary | ICD-10-CM

## 2014-03-10 DIAGNOSIS — B20 Human immunodeficiency virus [HIV] disease: Secondary | ICD-10-CM

## 2014-03-10 LAB — CEA: CEA: 2.1 ng/mL (ref 0.0–5.0)

## 2014-03-10 NOTE — Progress Notes (Signed)
  [name]   Diagnosis: Colon cancer  INTERVAL HISTORY:   She returns for scheduled visit. She missed several appointment earlier this year. [name] is followed closely by Dr. Megan Salon for management of HIV infection. She reports being compliant with the HIV medication. She feels well. Good appetite. No difficulty with bowel function. She reports chronic left greater than right low leg edema. She continues smoking.  Objective:  Vital signs in last 24 hours:  Blood pressure 117/71, pulse 58, temperature 98 F (36.7 C), temperature source Oral, resp. rate 18, height 5' 3.5" (1.613 m), weight 174 lb 3.2 oz (79.017 kg).    HEENT: Neck without mass Lymphatics: No cervical, supraclavicular, axillary, or inguinal nodes Resp: Lungs clear bilaterally Cardio: Regular rate and rhythm GI: A smooth liver edge is palpable a few fingers below the right costal margin. No splenomegaly. Nontender, no mass Vascular: Brown discoloration with skin thickening and trace edema at the left greater than right lower leg   Lab Results:  Lab Results  Component Value Date   WBC 1.5* 07/14/2013   HGB 13.4 07/14/2013   HCT 39.0 07/14/2013   MCV 95.8 07/14/2013   PLT 76* 07/14/2013   NEUTROABS 1.0* 03/17/2013     Lab Results  Component Value Date   CEA 2.1 03/10/2014    Medications: I have reviewed the patient's current medications.  Assessment/Plan: 1. 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 HIV  3. Hepatitis C  4. Cirrhosis with portal hypertension  5. Ongoing tobacco use  6. 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 She will return for an office visit and CEA in 6 months. We will refer her to Dr. Benson Norway for a Surveillance  Colonoscopy.  Betsy Coder, MD  03/10/2014  4:48 PM

## 2014-03-10 NOTE — Telephone Encounter (Signed)
Pt confirmed labs/ov per 09/15 POF, schd w/Dr. Benson Norway, gave pt AVS......KJ

## 2014-03-25 ENCOUNTER — Ambulatory Visit: Payer: Medicaid Other | Admitting: Internal Medicine

## 2014-04-01 ENCOUNTER — Other Ambulatory Visit: Payer: Medicaid Other

## 2014-04-02 ENCOUNTER — Other Ambulatory Visit: Payer: Medicaid Other

## 2014-04-02 DIAGNOSIS — B20 Human immunodeficiency virus [HIV] disease: Secondary | ICD-10-CM

## 2014-04-03 LAB — T-HELPER CELL (CD4) - (RCID CLINIC ONLY)
CD4 % Helper T Cell: 25 % — ABNORMAL LOW (ref 33–55)
CD4 T Cell Abs: 220 /uL — ABNORMAL LOW (ref 400–2700)

## 2014-04-03 LAB — HIV-1 RNA QUANT-NO REFLEX-BLD
HIV 1 RNA Quant: <20 copies/mL (ref <20)
HIV-1 RNA Quant, Log: <1.3 {Log} (ref <1.30)

## 2014-04-21 ENCOUNTER — Ambulatory Visit: Payer: Medicaid Other | Admitting: Internal Medicine

## 2014-05-27 ENCOUNTER — Encounter: Payer: Self-pay | Admitting: Internal Medicine

## 2014-05-27 ENCOUNTER — Ambulatory Visit (INDEPENDENT_AMBULATORY_CARE_PROVIDER_SITE_OTHER): Payer: Medicaid Other | Admitting: Internal Medicine

## 2014-05-27 ENCOUNTER — Ambulatory Visit (INDEPENDENT_AMBULATORY_CARE_PROVIDER_SITE_OTHER): Payer: Medicaid Other | Admitting: *Deleted

## 2014-05-27 VITALS — BP 119/67 | HR 73 | Temp 98.7°F | Wt 179.0 lb

## 2014-05-27 DIAGNOSIS — K219 Gastro-esophageal reflux disease without esophagitis: Secondary | ICD-10-CM

## 2014-05-27 DIAGNOSIS — Z23 Encounter for immunization: Secondary | ICD-10-CM

## 2014-05-27 DIAGNOSIS — B20 Human immunodeficiency virus [HIV] disease: Secondary | ICD-10-CM

## 2014-05-27 DIAGNOSIS — Z21 Asymptomatic human immunodeficiency virus [HIV] infection status: Secondary | ICD-10-CM

## 2014-05-27 DIAGNOSIS — G629 Polyneuropathy, unspecified: Secondary | ICD-10-CM

## 2014-05-27 MED ORDER — EMTRICITABINE-TENOFOVIR DF 200-300 MG PO TABS: 1.0000 | ORAL_TABLET | Freq: Every day | ORAL | Status: DC

## 2014-05-27 MED ORDER — DOLUTEGRAVIR SODIUM 50 MG PO TABS: 50.0000 mg | ORAL_TABLET | Freq: Every day | ORAL | Status: DC

## 2014-05-27 NOTE — Progress Notes (Signed)
Patient ID: Ruth Stephenson, female   DOB: April 15, 1951, 63 y.o.   MRN: [id number]          Patient Active Problem List   Diagnosis Date Noted  . HIV (human immunodeficiency virus infection) 09/19/2012    Priority: High  . Cancer of right colon 10/24/2012    Priority: Medium  . Hepatitis C 09/19/2012    Priority: Medium  . Neuropathy 05/27/2014  . GERD (gastroesophageal reflux disease) 05/27/2014  . History of pancreatitis 09/19/2012  . Tobacco abuse 09/19/2012  . Anemia 09/19/2012    Patient's Medications  New Prescriptions   DOLUTEGRAVIR (TIVICAY) 50 MG TABLET    Take 1 tablet (50 mg total) by mouth daily.   EMTRICITABINE-TENOFOVIR (TRUVADA) 200-300 MG PER TABLET    Take 1 tablet by mouth daily.  Previous Medications   OXYCODONE-ACETAMINOPHEN (PERCOCET/ROXICET) 5-325 MG PER TABLET    Take by mouth every 4 (four) hours as needed for severe pain.   OXYCODONE-ACETAMINOPHEN (ROXICET) 5-325 MG PER TABLET    Take 1 tablet by mouth every 4 (four) hours as needed for severe pain.  Modified Medications   No medications on file  Discontinued Medications   ELVITEGRAVIR-COBICISTAT-EMTRICITABINE-TENOFOVIR (STRIBILD) 150-150-200-300 MG TABS    Take 1 tablet by mouth every evening.     Subjective: Hollace is in for her first visit and 11 months. She continues to take her Stribild and has not missed doses. She has followed up with Dr. Benay Spice at the cancer center and remains free of her colon cancer. She has been bothered by burning pain in her feet and lower legs. It is present all day long but most noticeable at night when she tries to sleep. It seems to be getting worse. She states that we have talked about this before and that I put her on some medication that upset her stomach but I cannot find any record of that. She believes she may have an empty bottle at home. She is also bothered by acid reflux. She does not have a primary care provider. She continues to smoke cigarettes. She states  that she is down to only 3 or 4 each day. She has no current plan to quit.  Review of Systems: Constitutional: negative Eyes: negative Ears, nose, mouth, throat, and face: negative Respiratory: negative Cardiovascular: negative Gastrointestinal: positive for reflux symptoms, negative for abdominal pain, nausea, odynophagia and vomiting Genitourinary:negative  Past Medical History  Diagnosis Date  . HIV (human immunodeficiency virus infection)   . Hepatitis C   . Cancer     History  Substance Use Topics  . Smoking status: Current Every Day Smoker -- 0.30 packs/day    Types: Cigarettes  . Smokeless tobacco: Not on file     Comment: trying to cut back  . Alcohol Use: No    Family History  Problem Relation Age of Onset  . Cancer Mother     No Known Allergies  Objective: Temp: 98.7 F (37.1 C) (12/02 1556) Temp Source: Oral (12/02 1556) BP: 119/67 mmHg (12/02 1556) Pulse Rate: 73 (12/02 1556) Body mass index is 31.21 kg/(m^2).  General: She is talkative. She appears worried his usual Oral: No oropharyngeal lesions Skin: No rash Lungs: Clear Cor: Regular S1 and S2 no murmurs Abdomen: Obese, soft and nontender Joints and extremities: Her feet are warm and well perfused  Lab Results Lab Results  Component Value Date   WBC 1.5* 07/14/2013   HGB 13.4 07/14/2013   HCT 39.0 07/14/2013   MCV  95.8 07/14/2013   PLT 76* 07/14/2013    Lab Results  Component Value Date   CREATININE 0.65 07/14/2013   BUN 8 07/14/2013   NA 141 07/14/2013   K 4.4 07/14/2013   CL 104 07/14/2013   CO2 29 07/14/2013    Lab Results  Component Value Date   ALT 30 07/14/2013   AST 43* 07/14/2013   ALKPHOS 85 07/14/2013   BILITOT 0.8 07/14/2013    Lab Results  Component Value Date   CHOL 129 07/14/2013   HDL 51 07/14/2013   LDLCALC 71 07/14/2013   TRIG 34 07/14/2013   CHOLHDL 2.5 07/14/2013    Lab Results HIV 1 RNA QUANT (copies/mL)  Date Value  04/02/2014 <20  07/14/2013  <20  01/28/2013 <20   CD4 T CELL ABS  Date Value  04/02/2014 220 /uL*  07/14/2013 150 /uL*  01/28/2013 200 cmm*     Assessment: Her HIV infection remains under very good control. She is eager to start therapy for hepatitis C so I will need to change her Stribild to Truvada plus Tivicay while she is on hepatitis C therapy. She will return in one week to begin the application process to obtain hepatitis C therapy.  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 I asked her to bring the bottle of medicine that she previously took for leg pain in the time of her next visit for my review.  She does not have a primary care provider and has not been getting basic preventive services such as annual mammograms. We will help her obtain a primary care provider.  I talked to her about the importance of complete cigarette cessation in setting a quit date.  Plan: 1. Change Stribild to Truvada plus Tivicay 2. Begin application process to obtain hepatitis C therapies 3. Cigarette cessation counseling provided 4. Follow-up in 2 weeks   Michel Bickers, MD [facility] for [place] [phone] pager   [phone] cell 05/27/2014, 5:12 PM

## 2014-06-10 ENCOUNTER — Ambulatory Visit (INDEPENDENT_AMBULATORY_CARE_PROVIDER_SITE_OTHER): Payer: Medicaid Other | Admitting: Internal Medicine

## 2014-06-10 ENCOUNTER — Encounter: Payer: Self-pay | Admitting: Internal Medicine

## 2014-06-10 DIAGNOSIS — G629 Polyneuropathy, unspecified: Secondary | ICD-10-CM

## 2014-06-10 MED ORDER — AMITRIPTYLINE HCL 50 MG PO TABS: 50.0000 mg | ORAL_TABLET | Freq: Every day | ORAL | Status: DC

## 2014-06-10 NOTE — Progress Notes (Signed)
Patient ID: Ruth Stephenson, female   DOB: Jul 22, 1950, 63 y.o.   MRN: [id number]          Patient Active Problem List   Diagnosis Date Noted  . HIV (human immunodeficiency virus infection) 09/19/2012    Priority: High  . Cancer of right colon 10/24/2012    Priority: Medium  . Hepatitis C 09/19/2012    Priority: Medium  . Neuropathy 05/27/2014  . GERD (gastroesophageal reflux disease) 05/27/2014  . History of pancreatitis 09/19/2012  . Tobacco abuse 09/19/2012  . Anemia 09/19/2012    Patient's Medications  New Prescriptions   AMITRIPTYLINE (ELAVIL) 50 MG TABLET    Take 1 tablet (50 mg total) by mouth at bedtime.  Previous Medications   DOLUTEGRAVIR (TIVICAY) 50 MG TABLET    Take 1 tablet (50 mg total) by mouth daily.   EMTRICITABINE-TENOFOVIR (TRUVADA) 200-300 MG PER TABLET    Take 1 tablet by mouth daily.   OXYCODONE-ACETAMINOPHEN (PERCOCET/ROXICET) 5-325 MG PER TABLET    Take by mouth every 4 (four) hours as needed for severe pain.   OXYCODONE-ACETAMINOPHEN (ROXICET) 5-325 MG PER TABLET    Take 1 tablet by mouth every 4 (four) hours as needed for severe pain.  Modified Medications   No medications on file  Discontinued Medications   No medications on file    Subjective: Ruth Stephenson is in for her follow-up visit. She has done well with her switch from Stribild to Truvada plus Tivicay in anticipation of starting on therapy for hepatitis C.  She now remembers that the medicine she took for neuropathy many years ago was gabapentin. She states she did not like the way it made her feel. She is requesting to start back on some Percocet.  She is still smoking cigarettes but states that she believes that she can quit.  Review of Systems: Constitutional: positive for fatigue, negative for anorexia, chills, fevers, sweats and weight loss Eyes: negative Ears, nose, mouth, throat, and face: positive for nasal congestion, negative for sore mouth and sore throat Respiratory: positive for  chronic bronchitis, negative for dyspnea on exertion, pleurisy/chest pain and wheezing Cardiovascular: negative Gastrointestinal: negative Genitourinary:negative  Past Medical History  Diagnosis Date  . HIV (human immunodeficiency virus infection)   . Hepatitis C   . Cancer     History  Substance Use Topics  . Smoking status: Current Every Day Smoker -- 0.30 packs/day    Types: Cigarettes  . Smokeless tobacco: Not on file     Comment: trying to cut back  . Alcohol Use: No    Family History  Problem Relation Age of Onset  . Cancer Mother     No Known Allergies  Objective: Temp: 98.4 F (36.9 C) (12/16 1629) Temp Source: Oral (12/16 1629) BP: 115/71 mmHg (12/16 1629) Pulse Rate: 73 (12/16 1629) Body mass index is 30.68 kg/(m^2).  General: She appears worried his usual Oral: No oropharyngeal lesions Skin: No rash Lungs: Clear Cor: Regular S1 and S2 with no murmurs Abdomen: Obese, soft and nontender. I do not palpate a liver, spleen or other masses Joints and extremities: Her feet are warm and well perfused with intact light touch sensation Neuro: Alert with normal speech and conversation. Normal strength in lower extremities. Normal gait   Lab Results Lab Results  Component Value Date   WBC 1.5* 07/14/2013   HGB 13.4 07/14/2013   HCT 39.0 07/14/2013   MCV 95.8 07/14/2013   PLT 76* 07/14/2013    Lab Results  Component  Value Date   CREATININE 0.65 07/14/2013   BUN 8 07/14/2013   NA 141 07/14/2013   K 4.4 07/14/2013   CL 104 07/14/2013   CO2 29 07/14/2013    Lab Results  Component Value Date   ALT 30 07/14/2013   AST 43* 07/14/2013   ALKPHOS 85 07/14/2013   BILITOT 0.8 07/14/2013    Lab Results  Component Value Date   CHOL 129 07/14/2013   HDL 51 07/14/2013   LDLCALC 71 07/14/2013   TRIG 34 07/14/2013   CHOLHDL 2.5 07/14/2013    Lab Results HIV 1 RNA QUANT (copies/mL)  Date Value  04/02/2014 <20  07/14/2013 <20  01/28/2013 <20   CD4 T  CELL ABS  Date Value  04/02/2014 220 /uL*  07/14/2013 150 /uL*  01/28/2013 200 cmm*     Assessment: Her HIV infection remains under excellent control and anticipate she will continue to do well on her current regimen of Truvada and Tivicay. She can switch back to a 1 pill a day regimen after she completes therapy for hepatitis C.  If started the process to see if she can obtain Harvoni for hepatitis C therapy.  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 I told her that Percocet is not an option for treating chronic pain. I will start her on low-dose amitriptyline at bedtime.  Again, I encouraged her to set a quit date and try to quit smoking cigarettes completely.  Plan: 1. Continue Truvada and Tivicay 2. Apply for Harvoni 3. Start amitriptyline 50 mg at bedtime 4. Cigarette cessation counseling provided 5. Follow-up in 4 weeks   Michel Bickers, MD [facility] for [place] [phone] pager   [phone] cell 06/10/2014, 5:02 PM

## 2014-06-11 ENCOUNTER — Other Ambulatory Visit: Payer: Self-pay | Admitting: Pharmacist Clinician (PhC)/ Clinical Pharmacy Specialist

## 2014-06-11 MED ORDER — LEDIPASVIR-SOFOSBUVIR 90-400 MG PO TABS: 1.0000 | ORAL_TABLET | Freq: Every day | ORAL | Status: DC

## 2014-07-09 ENCOUNTER — Other Ambulatory Visit: Payer: Medicaid Other

## 2014-07-09 ENCOUNTER — Ambulatory Visit: Payer: Medicaid Other | Admitting: Family Medicine

## 2014-07-09 ENCOUNTER — Telehealth: Payer: Self-pay | Admitting: *Deleted

## 2014-07-09 ENCOUNTER — Ambulatory Visit: Payer: Medicaid Other | Admitting: Internal Medicine

## 2014-07-09 NOTE — Telephone Encounter (Signed)
Called patient to inform her of her missed visit.  She states she was up all night due to her leg pain.  She is unable to get a ride to get here this morning. RN informed her that Dr. Megan Salon would still like her to come today for a lab draw for her hepatitis C.  She agreed, will come for a lab appointment.  She will reschedule today's appointment when she comes for the lab. Landis Gandy, RN

## 2014-07-11 ENCOUNTER — Telehealth: Payer: Self-pay | Admitting: Oncology

## 2014-07-11 NOTE — Telephone Encounter (Signed)
s.w pt regarding appt d.t change due to MD on call....pt ok and aware °

## 2014-07-13 ENCOUNTER — Other Ambulatory Visit: Payer: Medicaid Other

## 2014-07-13 DIAGNOSIS — B182 Chronic viral hepatitis C: Secondary | ICD-10-CM

## 2014-07-14 LAB — HEPATITIS C RNA QUANTITATIVE
HCV Quantitative Log: <1.18 {Log} (ref <1.18)
HCV Quantitative: <15 IU/mL (ref <15)

## 2014-07-24 ENCOUNTER — Emergency Department (HOSPITAL_COMMUNITY)
Admission: EM | Admit: 2014-07-24 | Discharge: 2014-07-24 | Disposition: A | Discharge status: Home / Self Care | Payer: Medicaid Other | Attending: Emergency Medicine | Admitting: Emergency Medicine

## 2014-07-24 ENCOUNTER — Encounter (HOSPITAL_COMMUNITY): Payer: Self-pay

## 2014-07-24 ENCOUNTER — Emergency Department (HOSPITAL_COMMUNITY): Payer: Medicaid Other

## 2014-07-24 DIAGNOSIS — R140 Abdominal distension (gaseous): Secondary | ICD-10-CM | POA: Diagnosis not present

## 2014-07-24 DIAGNOSIS — Z21 Asymptomatic human immunodeficiency virus [HIV] infection status: Secondary | ICD-10-CM | POA: Diagnosis not present

## 2014-07-24 DIAGNOSIS — B182 Chronic viral hepatitis C: Secondary | ICD-10-CM

## 2014-07-24 DIAGNOSIS — R600 Localized edema: Secondary | ICD-10-CM | POA: Insufficient documentation

## 2014-07-24 DIAGNOSIS — M7989 Other specified soft tissue disorders: Secondary | ICD-10-CM | POA: Diagnosis present

## 2014-07-24 DIAGNOSIS — Z79899 Other long term (current) drug therapy: Secondary | ICD-10-CM | POA: Insufficient documentation

## 2014-07-24 DIAGNOSIS — Z720 Tobacco use: Secondary | ICD-10-CM | POA: Insufficient documentation

## 2014-07-24 DIAGNOSIS — Z859 Personal history of malignant neoplasm, unspecified: Secondary | ICD-10-CM | POA: Diagnosis not present

## 2014-07-24 LAB — CBC WITH DIFFERENTIAL/PLATELET
BASOS ABS: 0 10*3/uL (ref 0.0–0.1)
BASOS PCT: 0 % (ref 0–1)
EOS PCT: 2 % (ref 0–5)
Eosinophils Absolute: 0 10*3/uL (ref 0.0–0.7)
HCT: 39 % (ref 36.0–46.0)
HEMOGLOBIN: 13.5 g/dL (ref 12.0–15.0)
LYMPHS PCT: 46 % (ref 12–46)
Lymphs Abs: 1.1 10*3/uL (ref 0.7–4.0)
MCH: 34.4 pg — ABNORMAL HIGH (ref 26.0–34.0)
MCHC: 34.6 g/dL (ref 30.0–36.0)
MCV: 99.5 fL (ref 78.0–100.0)
Monocytes Absolute: 0.3 10*3/uL (ref 0.1–1.0)
Monocytes Relative: 15 % — ABNORMAL HIGH (ref 3–12)
Neutro Abs: 0.8 10*3/uL — ABNORMAL LOW (ref 1.7–7.7)
Neutrophils Relative %: 37 % — ABNORMAL LOW (ref 43–77)
Platelets: 86 10*3/uL — ABNORMAL LOW (ref 150–400)
RBC: 3.92 MIL/uL (ref 3.87–5.11)
RDW: 13 % (ref 11.5–15.5)
WBC: 2.2 10*3/uL — ABNORMAL LOW (ref 4.0–10.5)

## 2014-07-24 LAB — COMPREHENSIVE METABOLIC PANEL
ALBUMIN: 2.9 g/dL — AB (ref 3.5–5.2)
ALK PHOS: 85 U/L (ref 39–117)
ALT: 15 U/L (ref 0–35)
AST: 24 U/L (ref 0–37)
Anion gap: 3 — ABNORMAL LOW (ref 5–15)
BILIRUBIN TOTAL: 0.7 mg/dL (ref 0.3–1.2)
BUN: 6 mg/dL (ref 6–23)
CO2: 32 mmol/L (ref 19–32)
CREATININE: 0.62 mg/dL (ref 0.50–1.10)
Calcium: 8.9 mg/dL (ref 8.4–10.5)
Chloride: 105 mmol/L (ref 96–112)
GFR calc Af Amer: >90 mL/min (ref >90)
Glucose, Bld: 129 mg/dL — ABNORMAL HIGH (ref 70–99)
POTASSIUM: 4 mmol/L (ref 3.5–5.1)
Sodium: 140 mmol/L (ref 135–145)
Total Protein: 6.6 g/dL (ref 6.0–8.3)

## 2014-07-24 MED ORDER — OXYCODONE HCL 5 MG PO TABS
5.0000 mg | ORAL_TABLET | Freq: Once | ORAL | Status: AC
Start: 2014-07-24 — End: 2014-07-24
  Administered 2014-07-24: 5 mg via ORAL
  Filled 2014-07-24: qty 1

## 2014-07-24 MED ORDER — OXYCODONE HCL 5 MG PO TABS: 5.0000 mg | ORAL_TABLET | ORAL | Status: DC | PRN

## 2014-07-24 MED ORDER — FUROSEMIDE 20 MG PO TABS
20.0000 mg | ORAL_TABLET | Freq: Once | ORAL | Status: AC
  Administered 2014-07-24: 20 mg via ORAL
  Filled 2014-07-24: qty 1

## 2014-07-24 MED ORDER — FUROSEMIDE 20 MG PO TABS: 20.0000 mg | ORAL_TABLET | Freq: Every day | ORAL | Status: DC

## 2014-07-24 NOTE — ED Notes (Signed)
Pt c/o bilateral lower limb and bilateral hands edema x 2 weeks. Pt is currently in a trial with medication for liver disease. Reports seeing MD in charge of trial last week, denies increased edema at that time. Denies chest pain or shortness of breath. Pt reports having abdominal pain two days ago, relieved with laxative. Hands appear swollen and red without signs of infection.

## 2014-07-24 NOTE — ED Provider Notes (Signed)
CSN: [id number]     Arrival date & time 07/24/14  1323 History   First MD Initiated Contact with Patient 07/24/14 1419     Chief Complaint  Patient presents with  . Leg Swelling     (Consider location/radiation/quality/duration/timing/severity/associated sxs/prior Treatment) HPI Comments: Patient with history of hepatitis C, HIV -- presents with worsening lower extremity and upper extremity and abdominal swelling for the past 2 weeks, worse over the past 1 week. Patient is currently receiving a trial medication for her hepatitis but states that she started this several weeks before the swelling began. She does not have any chest pain or shortness of breath. Patient has never had swelling related to her liver. No color change or rash. No other new medications. The onset of this condition was gradual. The course is worsening. Aggravating factors: none. Alleviating factors: none.    The history is provided by the patient.    Past Medical History  Diagnosis Date  . HIV (human immunodeficiency virus infection)   . Hepatitis C   . Cancer    Past Surgical History  Procedure Laterality Date  . Colonoscopy N/A 09/20/2012    Procedure: COLONOSCOPY;  Surgeon: Beryle Beams, MD;  Location: [facility];  Service: Endoscopy;  Laterality: N/A;  . Colon resection N/A 09/23/2012    Procedure: LAPAROSCOPIC RIGHT COLON RESECTION   ;  Surgeon: Edward Jolly, MD;  Location: [facility];  Service: General;  Laterality: N/A;  LAPOROSCOPY, RIGHT HEMICOLECTOMY  . Colon surgery  09/23/2012    lap right colectomy  . Open reduction internal fixation (orif) distal radial fracture Left 01/19/2014    Procedure: OPEN REDUCTION INTERNAL FIXATION (ORIF) LEFT  DISTAL RADIAL FRACTURE;  Surgeon: Schuyler Amor, MD;  Location: [facility];  Service: Orthopedics;  Laterality: Left;  . Carpal tunnel release Left 01/19/2014    Procedure: LEFT CARPAL TUNNEL RELEASE;  Surgeon: Schuyler Amor, MD;   Location: [facility];  Service: Orthopedics;  Laterality: Left;   Family History  Problem Relation Age of Onset  . Cancer Mother    History  Substance Use Topics  . Smoking status: Current Every Day Smoker -- 0.30 packs/day    Types: Cigarettes  . Smokeless tobacco: Not on file     Comment: trying to cut back  . Alcohol Use: No   OB History    No data available     Review of Systems  Constitutional: Negative for fever.  HENT: Negative for rhinorrhea and sore throat.   Eyes: Negative for redness.  Respiratory: Negative for cough and shortness of breath.   Cardiovascular: Positive for leg swelling. Negative for chest pain.  Gastrointestinal: Negative for nausea, vomiting, abdominal pain and diarrhea.  Genitourinary: Negative for dysuria.  Musculoskeletal: Negative for myalgias.  Skin: Negative for rash.  Neurological: Negative for headaches.    Allergies  Review of patient's allergies indicates no known allergies.  Home Medications   Prior to Admission medications   Medication Sig Start Date End Date Taking? Authorizing Provider  amitriptyline (ELAVIL) 50 MG tablet Take 1 tablet (50 mg total) by mouth at bedtime. 06/10/14  Yes Michel Bickers, MD  dolutegravir (TIVICAY) 50 MG tablet Take 1 tablet (50 mg total) by mouth daily. 05/27/14  Yes Michel Bickers, MD  emtricitabine-tenofovir (TRUVADA) 200-300 MG per tablet Take 1 tablet by mouth daily. 05/27/14  Yes Michel Bickers, MD  Ledipasvir-Sofosbuvir (HARVONI) 90-400 MG TABS Take 1 tablet by mouth daily with breakfast. 06/11/14  Yes Michel Bickers, MD  oxyCODONE-acetaminophen (ROXICET) 5-325 MG per tablet Take 1 tablet by mouth every 4 (four) hours as needed for severe pain. Patient not taking: Reported on 05/27/2014 01/19/14   Charlotte Crumb, MD   BP 108/71 mmHg  Pulse 80  Temp(Src) 98.5 F (36.9 C) (Oral)  Resp 18  Ht 5\' 3"  (1.6 m)  Wt 185 lb 12.8 oz (84.278 kg)  BMI 32.92 kg/m2  SpO2 98%   Physical Exam   Constitutional: She appears well-developed and well-nourished.  HENT:  Head: Normocephalic and atraumatic.  Eyes: Conjunctivae are normal. Right eye exhibits no discharge. Left eye exhibits no discharge.  Neck: Normal range of motion. Neck supple.  Cardiovascular: Normal rate, regular rhythm and normal heart sounds.   No murmur heard. Pulmonary/Chest: Effort normal and breath sounds normal. No respiratory distress. She has no wheezes. She has no rales.  Abdominal: Soft. She exhibits distension (minor). There is no tenderness. There is no rebound and no guarding.  Negative fluid wave.   Musculoskeletal: She exhibits edema.  2+ non-pitting edema bilateral lower extremities to mid thighs, trace edema of bilateral upper extremities.  Neurological: She is alert.  Skin: Skin is warm and dry.  Psychiatric: She has a normal mood and affect.  Nursing note and vitals reviewed.   ED Course  Procedures (including critical care time) Labs Review Labs Reviewed  CBC WITH DIFFERENTIAL/PLATELET - Abnormal; Notable for the following:    WBC 2.2 (*)    MCH 34.4 (*)    Platelets 86 (*)    Neutrophils Relative % 37 (*)    Monocytes Relative 15 (*)    Neutro Abs 0.8 (*)    All other components within normal limits  COMPREHENSIVE METABOLIC PANEL - Abnormal; Notable for the following:    Glucose, Bld 129 (*)    Albumin 2.9 (*)    Anion gap 3 (*)    All other components within normal limits  URINALYSIS, ROUTINE W REFLEX MICROSCOPIC    Imaging Review Dg Chest 2 View  07/24/2014   CLINICAL DATA:  Bilateral leg swelling and shortness of breath for 2 weeks  EXAM: CHEST  2 VIEW  COMPARISON:  03/17/2013  FINDINGS: The cardiac shadow is within normal limits. The lungs are well aerated bilaterally. Very minimal atelectatic changes are noted in the right lung base. No focal confluent infiltrate or sizable effusion is noted.  IMPRESSION: Right basilar atelectasis.   Electronically Signed   By: [name]  M.D.   On: 07/24/2014 15:26     EKG Interpretation None       2:35 PM Patient seen and examined. Work-up initiated. Medications ordered.   Vital signs reviewed and are as follows: BP 108/71 mmHg  Pulse 80  Temp(Src) 98.5 F (36.9 C) (Oral)  Resp 18  Ht 5\' 3"  (1.6 m)  Wt 185 lb 12.8 oz (84.278 kg)  BMI 32.92 kg/m2  SpO2 98%  5:06 PM Patient discussed with and labs reviewed with Dr. Leonides Schanz.   Pt informed of results. She can follow-up with her PCP next week. Will treat pain and start on 20mg  Lasix daily. Pt given 2-week course.   Patient encouraged to return to the emergency department sooner if she develops worsening swelling, shortness of breath, chest pain, fever, or has any other concerns. She verbalizes understanding and agrees with plan.  Patient counseled on use of narcotic pain medications. Counseled not to combine these medications with others containing tylenol. Urged not to drink alcohol, drive,  or perform any other activities that requires focus while taking these medications. The patient verbalizes understanding and agrees with the plan.   MDM   Final diagnoses:  Extremity edema  Chronic hepatitis C without hepatic coma   Patient with gradually worsening edema in the setting of hepatitis C, HIV. Patient has both lower and upper extremity edema. She does not have any significant ascites. She has no chest pain or shortness of breath. Chest x-ray does not suggest congestive heart failure and she does not have a significant cardiac history. Liver function tests today are normal. She has a slightly low total protein. No signs of nephrotic syndrome. No signs of cellulitis or other infection. Do not suspect DVT.  Plan as above. Patient appears well, nontoxic. She is agreeable to plan and agrees to return if symptoms worsen over the weekend.    Carlisle Cater, PA-C 07/24/14 [place], DO 07/24/14 1718

## 2014-07-24 NOTE — ED Notes (Signed)
Pt states she has noticed she has been retaining fluid all over the past two weeks. No hx of HTN or DM. Doesn't eat a lot of salt. Has swelling all over her body, legs, hands. Is on a new liver study with a new medication but has been on that for 3 weeks.

## 2014-07-24 NOTE — Discharge Instructions (Signed)
Please read and follow all provided instructions.  Your diagnoses today include:  1. Extremity edema   2. Chronic hepatitis C without hepatic coma     Tests performed today include:  Blood counts and electrolytes  Liver and kidney function tests  Vital signs. See below for your results today.   Medications prescribed:   Oxycodone - narcotic pain medication  DO NOT drive or perform any activities that require you to be awake and alert because this medicine can make you drowsy.    Lasix - medication to help remove fluid  Take any prescribed medications only as directed.  Home care instructions:  Follow any educational materials contained in this packet.  Follow-up instructions: Please follow-up with your primary care provider in the next 3 days for further evaluation of your symptoms.   Return instructions:   Please return to the Emergency Department if you experience worsening symptoms.    Return with shortness of breath, chest pain, fever, worsening swelling despite medication  Please return if you have any other emergent concerns.  Additional Information:  Your vital signs today were: BP 110/70 mmHg   Pulse 56   Temp(Src) 98.5 F (36.9 C) (Oral)   Resp 17   Ht 5\' 3"  (1.6 m)   Wt 185 lb 12.8 oz (84.278 kg)   BMI 32.92 kg/m2   SpO2 95% If your blood pressure (BP) was elevated above 135/85 this visit, please have this repeated by your doctor within one month. --------------

## 2014-07-27 LAB — PATHOLOGIST SMEAR REVIEW

## 2014-07-28 ENCOUNTER — Ambulatory Visit (INDEPENDENT_AMBULATORY_CARE_PROVIDER_SITE_OTHER): Payer: Medicaid Other | Admitting: Family Medicine

## 2014-07-28 VITALS — BP 107/61 | HR 67 | Temp 98.5°F | Resp 16 | Ht 63.0 in | Wt 182.0 lb

## 2014-07-28 DIAGNOSIS — Z720 Tobacco use: Secondary | ICD-10-CM

## 2014-07-28 DIAGNOSIS — R600 Localized edema: Secondary | ICD-10-CM

## 2014-07-28 DIAGNOSIS — G629 Polyneuropathy, unspecified: Secondary | ICD-10-CM | POA: Diagnosis not present

## 2014-07-28 DIAGNOSIS — Z85038 Personal history of other malignant neoplasm of large intestine: Secondary | ICD-10-CM

## 2014-07-28 DIAGNOSIS — Z21 Asymptomatic human immunodeficiency virus [HIV] infection status: Secondary | ICD-10-CM | POA: Diagnosis not present

## 2014-07-28 DIAGNOSIS — Z0000 Encounter for general adult medical examination without abnormal findings: Secondary | ICD-10-CM

## 2014-07-28 DIAGNOSIS — B20 Human immunodeficiency virus [HIV] disease: Secondary | ICD-10-CM

## 2014-07-28 DIAGNOSIS — B182 Chronic viral hepatitis C: Secondary | ICD-10-CM

## 2014-07-28 NOTE — Patient Instructions (Signed)
Peripheral Neuropathy Peripheral neuropathy is a type of nerve damage. It affects nerves that carry signals between the spinal cord and other parts of the body. These are called peripheral nerves. With peripheral neuropathy, one nerve or a group of nerves may be damaged.  CAUSES  Many things can damage peripheral nerves. For some people with peripheral neuropathy, the cause is unknown. Some causes include:  Diabetes. This is the most common cause of peripheral neuropathy.  Injury to a nerve.  Pressure or stress on a nerve that lasts a long time.  Too little vitamin B. Alcoholism can lead to this.  Infections.  Autoimmune diseases, such as multiple sclerosis and systemic lupus erythematosus.  Inherited nerve diseases.  Some medicines, such as cancer drugs.  Toxic substances, such as lead and mercury.  Too little blood flowing to the legs.  Kidney disease.  Thyroid disease. SIGNS AND SYMPTOMS  Different people have different symptoms. The symptoms you have will depend on which of your nerves is damaged. Common symptoms include:  Loss of feeling (numbness) in the feet and hands.  Tingling in the feet and hands.  Pain that burns.  Very sensitive skin.  Weakness.  Not being able to move a part of the body (paralysis).  Muscle twitching.  Clumsiness or poor coordination.  Loss of balance.  Not being able to control your bladder.  Feeling dizzy.  Sexual problems. DIAGNOSIS  Peripheral neuropathy is a symptom, not a disease. Finding the cause of peripheral neuropathy can be hard. To figure that out, your health care provider will take a medical history and do a physical exam. A neurological exam will also be done. This involves checking things affected by your brain, spinal cord, and nerves (nervous system). For example, your health care provider will check your reflexes, how you move, and what you can feel.  Other types of tests may also be ordered, such as:  Blood  tests.  A test of the fluid in your spinal cord.  Imaging tests, such as CT scans or an MRI.  Electromyography (EMG). This test checks the nerves that control muscles.  Nerve conduction velocity tests. These tests check how fast messages pass through your nerves.  Nerve biopsy. A small piece of nerve is removed. It is then checked under a microscope. TREATMENT   Medicine is often used to treat peripheral neuropathy. Medicines may include:  Pain-relieving medicines. Prescription or over-the-counter medicine may be suggested.  Antiseizure medicine. This may be used for pain.  Antidepressants. These also may help ease pain from neuropathy.  Lidocaine. This is a numbing medicine. You might wear a patch or be given a shot.  Mexiletine. This medicine is typically used to help control irregular heart rhythms.  Surgery. Surgery may be needed to relieve pressure on a nerve or to destroy a nerve that is causing pain.  Physical therapy to help movement.  Assistive devices to help movement. HOME CARE INSTRUCTIONS   Only take over-the-counter or prescription medicines as directed by your health care provider. Follow the instructions carefully for any given medicines. Do not take any other medicines without first getting approval from your health care provider.  If you have diabetes, work closely with your health care provider to keep your blood sugar under control.  If you have numbness in your feet:  Check every day for signs of injury or infection. Watch for redness, warmth, and swelling.  Wear padded socks and comfortable shoes. These help protect your feet.  Do not do   things that put pressure on your damaged nerve.  Do not smoke. Smoking keeps blood from getting to damaged nerves.  Avoid or limit alcohol. Too much alcohol can cause a lack of B vitamins. These vitamins are needed for healthy nerves.  Develop a good support system. Coping with peripheral neuropathy can be  stressful. Talk to a mental health specialist or join a support group if you are struggling.  Follow up with your health care provider as directed. SEEK MEDICAL CARE IF:   You have new signs or symptoms of peripheral neuropathy.  You are struggling emotionally from dealing with peripheral neuropathy.  You have a fever. SEEK IMMEDIATE MEDICAL CARE IF:   You have an injury or infection that is not healing.  You feel very dizzy or begin vomiting.  You have chest pain.  You have trouble breathing. Document Released: 06/02/2002 Document Revised: 02/22/2011 Document Reviewed: 02/17/2013 [facility] Patient Information 2015 [place], [place]. This information is not intended to replace advice given to you by your health care provider. Make sure you discuss any questions you have with your health care provider. Nicotine Addiction Nicotine can act as both a stimulant (excites/activates) and a sedative (calms/quiets). Immediately after exposure to nicotine, there is a "kick" caused in part by the drug's stimulation of the adrenal glands and resulting discharge of adrenaline (epinephrine). The rush of adrenaline stimulates the body and causes a sudden release of sugar. This means that smokers are always slightly hyperglycemic. Hyperglycemic means that the blood sugar is high, just like in diabetics. Nicotine also decreases the amount of insulin which helps control sugar levels in the body. There is an increase in blood pressure, breathing, and the rate of heart beats.  In addition, nicotine indirectly causes a release of dopamine in the brain that controls pleasure and motivation. A similar reaction is seen with other drugs of abuse, such as cocaine and heroin. This dopamine release is thought to cause the pleasurable sensations when smoking. In some different cases, nicotine can also create a calming effect, depending on sensitivity of the smoker's nervous system and the dose of nicotine taken. WHAT HAPPENS  WHEN NICOTINE IS TAKEN FOR LONG PERIODS OF TIME?  Long-term use of nicotine results in addiction. It is difficult to stop.  Repeated use of nicotine creates tolerance. Higher doses of nicotine are needed to get the "kick." When nicotine use is stopped, withdrawal may last a month or more. Withdrawal may begin within a few hours after the last cigarette. Symptoms peak within the first few days and may lessen within a few weeks. For some people, however, symptoms may last for months or longer. Withdrawal symptoms include:   Irritability.  Craving.  Learning and attention deficits.  Sleep disturbances.  Increased appetite. Craving for tobacco may last for 6 months or longer. Many behaviors done while using nicotine can also play a part in the severity of withdrawal symptoms. For some people, the feel, smell, and sight of a cigarette and the ritual of obtaining, handling, lighting, and smoking the cigarette are closely linked with the pleasure of smoking. When stopped, they also miss the related behaviors which make the withdrawal or craving worse. While nicotine gum and patches may lessen the drug aspects of withdrawal, cravings often persist. WHAT ARE THE MEDICAL CONSEQUENCES OF NICOTINE USE?  Nicotine addiction accounts for one-third of all cancers. The top cancer caused by tobacco is lung cancer. Lung cancer is the number one cancer killer of both men and women.  Smoking  is also associated with cancers of the:  Mouth.  Pharynx.  Larynx.  Esophagus.  Stomach.  Pancreas.  Cervix.  Kidney.  Ureter.  Bladder.  Smoking also causes lung diseases such as lasting (chronic) bronchitis and emphysema.  It worsens asthma in adults and children.  Smoking increases the risk of heart disease, including:  Stroke.  Heart attack.  Vascular disease.  Aneurysm.  Passive or secondary smoke can also increase medical risks including:  Asthma in children.  Sudden Infant Death  Syndrome (SIDS).  Additionally, dropped cigarettes are the leading cause of residential fire fatalities.  Nicotine poisoning has been reported from accidental ingestion of tobacco products by children and pets. Death usually results in a few minutes from respiratory failure (when a person stops breathing) caused by paralysis. TREATMENT   Medication. Nicotine replacement medicines such as nicotine gum and the patch are used to stop smoking. These medicines gradually lower the dosage of nicotine in the body. These medicines do not contain the carbon monoxide and other toxins found in tobacco smoke.  Hypnotherapy.  Relaxation therapy.  Nicotine Anonymous (a 12-step support program). Find times and locations in your local yellow pages. Document Released: 02/16/2004 Document Revised: 09/04/2011 Document Reviewed: 08/08/2013 [facility] Patient Information 2015 [place], [place]. This information is not intended to replace advice given to you by your health care provider. Make sure you discuss any questions you have with your health care provider. Edema Edema is an abnormal buildup of fluids in your bodytissues. Edema is somewhatdependent on gravity to pull the fluid to the lowest place in your body. That makes the condition more common in the legs and thighs (lower extremities). Painless swelling of the feet and ankles is common and becomes more likely as you get older. It is also common in looser tissues, like around your eyes.  When the affected area is squeezed, the fluid may move out of that spot and leave a dent for a few moments. This dent is called pitting.  CAUSES  There are many possible causes of edema. Eating too much salt and being on your feet or sitting for a long time can cause edema in your legs and ankles. Hot weather may make edema worse. Common medical causes of edema include:  Heart failure.  Liver disease.  Kidney disease.  Weak blood vessels in your legs.  Cancer.  An  injury.  Pregnancy.  Some medications.  Obesity. SYMPTOMS  Edema is usually painless.Your skin may look swollen or shiny.  DIAGNOSIS  Your health care provider may be able to diagnose edema by asking about your medical history and doing a physical exam. You may need to have tests such as X-rays, an electrocardiogram, or blood tests to check for medical conditions that may cause edema.  TREATMENT  Edema treatment depends on the cause. If you have heart, liver, or kidney disease, you need the treatment appropriate for these conditions. General treatment may include:  Elevation of the affected body part above the level of your heart.  Compression of the affected body part. Pressure from elastic bandages or support stockings squeezes the tissues and forces fluid back into the blood vessels. This keeps fluid from entering the tissues.  Restriction of fluid and salt intake.  Use of a water pill (diuretic). These medications are appropriate only for some types of edema. They pull fluid out of your body and make you urinate more often. This gets rid of fluid and reduces swelling, but diuretics can have side effects. Only  use diuretics as directed by your health care provider. HOME CARE INSTRUCTIONS   Keep the affected body part above the level of your heart when you are lying down.   Do not sit still or stand for prolonged periods.   Do not put anything directly under your knees when lying down.  Do not wear constricting clothing or garters on your upper legs.   Exercise your legs to work the fluid back into your blood vessels. This may help the swelling go down.   Wear elastic bandages or support stockings to reduce ankle swelling as directed by your health care provider.   Eat a low-salt diet to reduce fluid if your health care provider recommends it.   Only take medicines as directed by your health care provider. SEEK MEDICAL CARE IF:   Your edema is not responding to  treatment.  You have heart, liver, or kidney disease and notice symptoms of edema.  You have edema in your legs that does not improve after elevating them.   You have sudden and unexplained weight gain. SEEK IMMEDIATE MEDICAL CARE IF:   You develop shortness of breath or chest pain.   You cannot breathe when you lie down.  You develop pain, redness, or warmth in the swollen areas.   You have heart, liver, or kidney disease and suddenly get edema.  You have a fever and your symptoms suddenly get worse. MAKE SURE YOU:   Understand these instructions.  Will watch your condition.  Will get help right away if you are not doing well or get worse. Document Released: 06/12/2005 Document Revised: 10/27/2013 Document Reviewed: 04/04/2013 [facility] Patient Information 2015 [place], [place]. This information is not intended to replace advice given to you by your health care provider. Make sure you discuss any questions you have with your health care provider.

## 2014-07-28 NOTE — Progress Notes (Signed)
Subjective:    Patient ID: Ruth Stephenson, female    DOB: [birth year], 64 y.o.   MRN: [id number]  HPI Ruth Stephenson is a 64 year old female with a history of HIV, hepatitis C, colon cancer neuropathy, and lower extremity edema that presents to office to establish care with a primary provider.   She states that she is followed by Dr. Michel Bickers at [facility] for HIV and Hepatitis C. She states that she has had HIV for 26 years. She initially started medications 5 years ago.  HIV is controlled on HAART therapy and she maintains that she takes medications consistently. She recently started Harvoni therapy for hepatitis C and is tolerating well. She currently denies fever, night sweats, cough, weakness, skin eruptions, nausea, vomiting, or diarrhea.    She is also complaining of neuropathy that has been worsening over the past several weeks. She was evaluated in the emergency department on 07/24/2014 Symptoms are currently of moderate severity. Symptoms occur intermittently.  The patient denies weakness or difficulty ambulating. Symptoms occur primarily to bilateral lower extremities. Patient was prescribed Oxycodone in the emergency department on 07/24/2014 and reports satisfactory relief.   Ruth Stephenson is complaining of edema to lower extremities. She states that swelling of feet has been occuring for the past 2 weeks. She states that she was prescribed Furosemide 20 mg in the emergency department on 07/24/2014. She states that she has taken several doses of lasix with minimal relief. She currently denies headache, dizziness, syncope, shortness of breath, chest pains, palpitations, or nausea.   Past Medical History  Diagnosis Date  . HIV (human immunodeficiency virus infection)   . Hepatitis C   . Cancer      Review of Systems  Constitutional: Negative.   HENT: Negative.   Cardiovascular: Positive for leg swelling.  Gastrointestinal: Positive for abdominal  distention. Negative for nausea, vomiting, diarrhea, constipation and rectal pain.  Musculoskeletal: Positive for arthralgias.  Neurological: Positive for dizziness. Negative for tremors, syncope, weakness, light-headedness and numbness.       Objective:   Physical Exam  Constitutional: She is oriented to person, place, and time. She appears well-developed.  HENT:  Head: Normocephalic and atraumatic.  Right Ear: External ear normal.  Left Ear: External ear normal.  Mouth/Throat: Oropharynx is clear and moist.  Eyes: Conjunctivae and EOM are normal. Pupils are equal, round, and reactive to light.  Neck: Normal range of motion. Neck supple.  Cardiovascular: Normal rate, regular rhythm, normal heart sounds, intact distal pulses and normal pulses.  Exam reveals no decreased pulses.   Trace  (non-pitting edema) to ankles  Pulmonary/Chest: Effort normal and breath sounds normal. She exhibits no tenderness.  Abdominal: Soft. Bowel sounds are normal. She exhibits distension (abdominal obesity) and mass. She exhibits no fluid wave. There is no hepatosplenomegaly, splenomegaly or hepatomegaly. There is no tenderness.  Musculoskeletal: Normal range of motion.  Neurological: She is alert and oriented to person, place, and time. She has normal reflexes.  Skin: Skin is warm and dry.  Psychiatric: She has a normal mood and affect. Her behavior is normal. Judgment and thought content normal.         BP 107/61 mmHg  Pulse 67  Temp(Src) 98.5 F (36.9 C) (Oral)  Resp 16  Ht 5\' 3"  (1.6 m)  Wt 182 lb (82.555 kg)  BMI 32.25 kg/m2 Assessment & Plan:   1. Neuropathy Ruth Stephenson states that she has been experiencing neuropathy for several  years. She was evaluated in the ER on 07/24/2014 and was given Oxycodone. She states that pain has improved on Oxycodone. We discussed that Oxycodone is not an option for treating neuropathy or chronic pain. She states that she has taken Gabapentin in the past with  unsatisfactory results. Discussed medication options,at length.  2. Bilateral edema of lower extremity Ruth Stephenson was evaluated in the emergency department on 07/24/2014 for  Neuropathy to lower extremities and pedal edema. She currently denies chest pains, palpitations, dizziness, fatigue, and weakness. She states that swelling has increased over the past 2 weeks. She started Harvoni for Hepatitis C greater than 1 month ago. She currently has trace edema to bilateral lower extremities. Pt has a history of cirrhosis and is currently on Harvoni for Hepatitis C. Albumin level is mildly decreased.  She is scheduled to follow up with Dr. Megan Salon on 07/30/2014. She states that she did not get Rx for Lasix filled, her uncle had the same Rx; so she decided to take his dose. I advised Ruth Stephenson to refrain from taking Rx. She expressed understanding.    She refused an EKG, she stated that her ride could not wait any longer.   3. HIV (human immunodeficiency virus infection) Ruth Stephenson is followed by Dr. Bridget Hartshorn at [facility] for Infectious disease. She has been controlled on currently medication regimen. She was transitioned from Stribild to Truvada greater than 1 month ago. She reports that she is tolerating medication well. Reviewed previous labs  4. Tobacco abuse Ruth Stephenson is currently in the precontemplative state. Smoking cessation instruction/counseling given:  counseled patient on the dangers of tobacco use, advised patient to stop smoking, and reviewed strategies to maximize success  5. Hepatitis C  Continue Harvoni therapy and follow up with Dr. Megan Salon as scheduled  6. History of colon cancer, stage II Ruth Stephenson currently followed by Dr. Benay Spice every 6 months for a history of colon cancer. Patient had a colon resection in 2014 for colon cancer. She is scheduled to follow up with Dr. Benay Spice on 08/28/2014. She was also scheduled for a surveilence colonoscopy with Dr. Carol Ada. Ruth Stephenson states  that she has not had a recent colonoscopy.         Annual physical exam Patient is scheduled for her annual physical examination with Dr. Zigmund Daniel in 3 months. Patient will return for labs 1 weeks prior to appointment.  - Lipid Panel; Future - TSH; Future - Urinalysis, Complete; Future - Hemoglobin A1c; Future    Dorena Dew, FNP

## 2014-07-29 ENCOUNTER — Encounter: Payer: Self-pay | Admitting: Family Medicine

## 2014-07-29 DIAGNOSIS — R600 Localized edema: Secondary | ICD-10-CM | POA: Insufficient documentation

## 2014-07-29 DIAGNOSIS — Z85038 Personal history of other malignant neoplasm of large intestine: Secondary | ICD-10-CM | POA: Insufficient documentation

## 2014-07-30 ENCOUNTER — Ambulatory Visit (INDEPENDENT_AMBULATORY_CARE_PROVIDER_SITE_OTHER): Payer: Medicaid Other | Admitting: Internal Medicine

## 2014-07-30 ENCOUNTER — Encounter: Payer: Self-pay | Admitting: Internal Medicine

## 2014-07-30 DIAGNOSIS — Z21 Asymptomatic human immunodeficiency virus [HIV] infection status: Secondary | ICD-10-CM

## 2014-07-30 DIAGNOSIS — B1710 Acute hepatitis C without hepatic coma: Secondary | ICD-10-CM

## 2014-07-30 DIAGNOSIS — B20 Human immunodeficiency virus [HIV] disease: Secondary | ICD-10-CM

## 2014-07-30 NOTE — Progress Notes (Signed)
Patient ID: Ruth Stephenson, female   DOB: 1950/07/26, 64 y.o.   MRN: [id number]          Patient Active Problem List   Diagnosis Date Noted  . HIV (human immunodeficiency virus infection) 09/19/2012    Priority: High  . Cancer of right colon 10/24/2012    Priority: Medium  . Hepatitis C 09/19/2012    Priority: Medium  . Bilateral edema of lower extremity 07/29/2014  . History of colon cancer, stage II 07/29/2014  . Neuropathy 05/27/2014  . GERD (gastroesophageal reflux disease) 05/27/2014  . History of pancreatitis 09/19/2012  . Tobacco abuse 09/19/2012  . Anemia 09/19/2012    Patient's Medications  New Prescriptions   No medications on file  Previous Medications   AMITRIPTYLINE (ELAVIL) 50 MG TABLET    Take 1 tablet (50 mg total) by mouth at bedtime.   DOLUTEGRAVIR (TIVICAY) 50 MG TABLET    Take 1 tablet (50 mg total) by mouth daily.   EMTRICITABINE-TENOFOVIR (TRUVADA) 200-300 MG PER TABLET    Take 1 tablet by mouth daily.   FUROSEMIDE (LASIX) 20 MG TABLET    Take 1 tablet (20 mg total) by mouth daily.   LEDIPASVIR-SOFOSBUVIR (HARVONI) 90-400 MG TABS    Take 1 tablet by mouth daily with breakfast.   OXYCODONE (ROXICODONE) 5 MG IMMEDIATE RELEASE TABLET    Take 1 tablet (5 mg total) by mouth every 4 (four) hours as needed for severe pain.  Modified Medications   No medications on file  Discontinued Medications   No medications on file    Subjective: Ruth Stephenson is in for her routine HIV and hepatitis C follow-up. She denies missing any doses of her Truvada and Tivicay. She started Harvoni 6 weeks ago. She had trouble getting to the pharmacy to get her second months supply and was out of it for 3 days. Other than that she has not missed any doses. She has noted increased weight gain and swelling in her abdomen and legs recently. She did start taking amitriptyline at bedtime and notes that this helps with her neuropathic pain and allows her to sleep better. She is down to smoking 2  cigarettes daily but has no current plan to quit completely. Review of Systems: Positive for malaise and weight gain; negative for fever or chills sweats nausea vomiting and abdominal pain  Past Medical History  Diagnosis Date  . HIV (human immunodeficiency virus infection)   . Hepatitis C   . Cancer     History  Substance Use Topics  . Smoking status: Current Every Day Smoker -- 0.30 packs/day    Types: Cigarettes  . Smokeless tobacco: Not on file     Comment: trying to cut back  . Alcohol Use: No    Family History  Problem Relation Age of Onset  . Cancer Mother     No Known Allergies  Objective: Temp: 97.7 F (36.5 C) (02/04 1504) Temp Source: Oral (02/04 1504) BP: 123/72 mmHg (02/04 1504) Pulse Rate: 73 (02/04 1504) Body mass index is 33.75 kg/(m^2).  General: She appears worried but otherwise in no distress Oral: No oropharyngeal lesions Skin: No rash Lungs: Clear Cor: Regular S1 and S2 with no murmurs Abdomen: Distended but soft and nontender. No palpable masses. No fluid wave identified Joints and extremities: Nonpitting swelling of her legs   Lab Results Lab Results  Component Value Date   WBC 2.2* 07/24/2014   HGB 13.5 07/24/2014   HCT 39.0 07/24/2014   MCV  99.5 07/24/2014   PLT 86* 07/24/2014    Lab Results  Component Value Date   CREATININE 0.62 07/24/2014   BUN 6 07/24/2014   NA 140 07/24/2014   K 4.0 07/24/2014   CL 105 07/24/2014   CO2 32 07/24/2014    Lab Results  Component Value Date   ALT 15 07/24/2014   AST 24 07/24/2014   ALKPHOS 85 07/24/2014   BILITOT 0.7 07/24/2014    Lab Results  Component Value Date   CHOL 129 07/14/2013   HDL 51 07/14/2013   LDLCALC 71 07/14/2013   TRIG 34 07/14/2013   CHOLHDL 2.5 07/14/2013    Lab Results HIV 1 RNA QUANT (copies/mL)  Date Value  04/02/2014 <20  07/14/2013 <20  01/28/2013 <20   CD4 T CELL ABS  Date Value  04/02/2014 220 /uL*  07/14/2013 150 /uL*  01/28/2013 200 cmm*    Hepatitis C RNA viral load 07/14/2013: 66,063,016 international units per mL 07/13/2014: Less than 15 international units per mL   Assessment: Her HIV infection remains under very good control.  She has had an excellent response to the first month of her hepatitis C therapy. She will complete 6 more weeks of treatment then have repeat hepatitis C viral load.  Her peripheral neuropathy pain is under better control with amitriptyline.  Plan: 1. Continue current medications 2. Follow-up after lab work in 6 weeks 3. Cigarette cessation counseling provided again   Michel Bickers, MD [facility] for [place] [phone] pager   [phone] cell 07/30/2014, 3:25 PM

## 2014-08-26 ENCOUNTER — Emergency Department (HOSPITAL_COMMUNITY)
Admission: EM | Admit: 2014-08-26 | Discharge: 2014-08-26 | Disposition: A | Discharge status: Home / Self Care | Payer: Medicaid Other | Attending: Emergency Medicine | Admitting: Emergency Medicine

## 2014-08-26 ENCOUNTER — Emergency Department (HOSPITAL_COMMUNITY): Payer: Medicaid Other

## 2014-08-26 ENCOUNTER — Encounter (HOSPITAL_COMMUNITY): Payer: Self-pay | Admitting: Emergency Medicine

## 2014-08-26 DIAGNOSIS — Z21 Asymptomatic human immunodeficiency virus [HIV] infection status: Secondary | ICD-10-CM | POA: Insufficient documentation

## 2014-08-26 DIAGNOSIS — Z859 Personal history of malignant neoplasm, unspecified: Secondary | ICD-10-CM | POA: Diagnosis not present

## 2014-08-26 DIAGNOSIS — Z8619 Personal history of other infectious and parasitic diseases: Secondary | ICD-10-CM | POA: Insufficient documentation

## 2014-08-26 DIAGNOSIS — M2540 Effusion, unspecified joint: Secondary | ICD-10-CM | POA: Diagnosis present

## 2014-08-26 DIAGNOSIS — Z720 Tobacco use: Secondary | ICD-10-CM | POA: Insufficient documentation

## 2014-08-26 DIAGNOSIS — R600 Localized edema: Secondary | ICD-10-CM | POA: Insufficient documentation

## 2014-08-26 DIAGNOSIS — M25562 Pain in left knee: Secondary | ICD-10-CM | POA: Diagnosis not present

## 2014-08-26 DIAGNOSIS — M25561 Pain in right knee: Secondary | ICD-10-CM | POA: Diagnosis not present

## 2014-08-26 DIAGNOSIS — Z79899 Other long term (current) drug therapy: Secondary | ICD-10-CM | POA: Insufficient documentation

## 2014-08-26 LAB — BRAIN NATRIURETIC PEPTIDE: B NATRIURETIC PEPTIDE 5: 32 pg/mL (ref 0.0–100.0)

## 2014-08-26 LAB — CBC WITH DIFFERENTIAL/PLATELET
Basophils Absolute: 0 10*3/uL (ref 0.0–0.1)
Basophils Relative: 0 % (ref 0–1)
EOS ABS: 0.1 10*3/uL (ref 0.0–0.7)
Eosinophils Relative: 2 % (ref 0–5)
HCT: 44.3 % (ref 36.0–46.0)
Hemoglobin: 14.5 g/dL (ref 12.0–15.0)
Lymphocytes Relative: 39 % (ref 12–46)
Lymphs Abs: 1.3 10*3/uL (ref 0.7–4.0)
MCH: 33.7 pg (ref 26.0–34.0)
MCHC: 32.7 g/dL (ref 30.0–36.0)
MCV: 103 fL — ABNORMAL HIGH (ref 78.0–100.0)
MONOS PCT: 15 % — AB (ref 3–12)
Monocytes Absolute: 0.5 10*3/uL (ref 0.1–1.0)
NEUTROS PCT: 44 % (ref 43–77)
Neutro Abs: 1.5 10*3/uL — ABNORMAL LOW (ref 1.7–7.7)
PLATELETS: 83 10*3/uL — AB (ref 150–400)
RBC: 4.3 MIL/uL (ref 3.87–5.11)
RDW: 13.3 % (ref 11.5–15.5)
WBC: 3.3 10*3/uL — ABNORMAL LOW (ref 4.0–10.5)

## 2014-08-26 LAB — BASIC METABOLIC PANEL
ANION GAP: 6 (ref 5–15)
BUN: 6 mg/dL (ref 6–23)
CALCIUM: 9.3 mg/dL (ref 8.4–10.5)
CO2: 30 mmol/L (ref 19–32)
Chloride: 104 mmol/L (ref 96–112)
Creatinine, Ser: 0.62 mg/dL (ref 0.50–1.10)
GLUCOSE: 104 mg/dL — AB (ref 70–99)
POTASSIUM: 4.6 mmol/L (ref 3.5–5.1)
Sodium: 140 mmol/L (ref 135–145)

## 2014-08-26 MED ORDER — HYDROCODONE-ACETAMINOPHEN 5-325 MG PO TABS
2.0000 | ORAL_TABLET | Freq: Once | ORAL | Status: AC
  Administered 2014-08-26: 2 via ORAL
  Filled 2014-08-26: qty 2

## 2014-08-26 MED ORDER — FUROSEMIDE 20 MG PO TABS: 20.0000 mg | ORAL_TABLET | Freq: Two times a day (BID) | ORAL | Status: DC

## 2014-08-26 MED ORDER — HYDROCODONE-ACETAMINOPHEN 5-325 MG PO TABS: 1.0000 | ORAL_TABLET | Freq: Four times a day (QID) | ORAL | Status: DC | PRN

## 2014-08-26 NOTE — ED Notes (Addendum)
Pt st's she has had bil knee swelling x's 2 weeks.  Also st's she has been swelling all over in past 2 weeks

## 2014-08-26 NOTE — Discharge Instructions (Signed)
Lasix as prescribed.  Hydrocodone as prescribed as needed for pain.  Follow-up with your primary Dr. in the next week for reevaluation.   Edema Edema is an abnormal buildup of fluids in your bodytissues. Edema is somewhatdependent on gravity to pull the fluid to the lowest place in your body. That makes the condition more common in the legs and thighs (lower extremities). Painless swelling of the feet and ankles is common and becomes more likely as you get older. It is also common in looser tissues, like around your eyes.  When the affected area is squeezed, the fluid may move out of that spot and leave a dent for a few moments. This dent is called pitting.  CAUSES  There are many possible causes of edema. Eating too much salt and being on your feet or sitting for a long time can cause edema in your legs and ankles. Hot weather may make edema worse. Common medical causes of edema include:  Heart failure.  Liver disease.  Kidney disease.  Weak blood vessels in your legs.  Cancer.  An injury.  Pregnancy.  Some medications.  Obesity. SYMPTOMS  Edema is usually painless.Your skin may look swollen or shiny.  DIAGNOSIS  Your health care provider may be able to diagnose edema by asking about your medical history and doing a physical exam. You may need to have tests such as X-rays, an electrocardiogram, or blood tests to check for medical conditions that may cause edema.  TREATMENT  Edema treatment depends on the cause. If you have heart, liver, or kidney disease, you need the treatment appropriate for these conditions. General treatment may include:  Elevation of the affected body part above the level of your heart.  Compression of the affected body part. Pressure from elastic bandages or support stockings squeezes the tissues and forces fluid back into the blood vessels. This keeps fluid from entering the tissues.  Restriction of fluid and salt intake.  Use of a water pill  (diuretic). These medications are appropriate only for some types of edema. They pull fluid out of your body and make you urinate more often. This gets rid of fluid and reduces swelling, but diuretics can have side effects. Only use diuretics as directed by your health care provider. HOME CARE INSTRUCTIONS   Keep the affected body part above the level of your heart when you are lying down.   Do not sit still or stand for prolonged periods.   Do not put anything directly under your knees when lying down.  Do not wear constricting clothing or garters on your upper legs.   Exercise your legs to work the fluid back into your blood vessels. This may help the swelling go down.   Wear elastic bandages or support stockings to reduce ankle swelling as directed by your health care provider.   Eat a low-salt diet to reduce fluid if your health care provider recommends it.   Only take medicines as directed by your health care provider. SEEK MEDICAL CARE IF:   Your edema is not responding to treatment.  You have heart, liver, or kidney disease and notice symptoms of edema.  You have edema in your legs that does not improve after elevating them.   You have sudden and unexplained weight gain. SEEK IMMEDIATE MEDICAL CARE IF:   You develop shortness of breath or chest pain.   You cannot breathe when you lie down.  You develop pain, redness, or warmth in the swollen areas.  You have heart, liver, or kidney disease and suddenly get edema.  You have a fever and your symptoms suddenly get worse. MAKE SURE YOU:   Understand these instructions.  Will watch your condition.  Will get help right away if you are not doing well or get worse. Document Released: 06/12/2005 Document Revised: 10/27/2013 Document Reviewed: 04/04/2013 [facility] Patient Information 2015 [place], [place]. This information is not intended to replace advice given to you by your health care provider. Make sure you  discuss any questions you have with your health care provider.  Knee Pain The knee is the complex joint between your thigh and your lower leg. It is made up of bones, tendons, ligaments, and cartilage. The bones that make up the knee are:  The femur in the thigh.  The tibia and fibula in the lower leg.  The patella or kneecap riding in the groove on the lower femur. CAUSES  Knee pain is a common complaint with many causes. A few of these causes are:  Injury, such as:  A ruptured ligament or tendon injury.  Torn cartilage.  Medical conditions, such as:  Gout  Arthritis  Infections  Overuse, over training, or overdoing a physical activity. Knee pain can be minor or severe. Knee pain can accompany debilitating injury. Minor knee problems often respond well to self-care measures or get well on their own. More serious injuries may need medical intervention or even surgery. SYMPTOMS The knee is complex. Symptoms of knee problems can vary widely. Some of the problems are:  Pain with movement and weight bearing.  Swelling and tenderness.  Buckling of the knee.  Inability to straighten or extend your knee.  Your knee locks and you cannot straighten it.  Warmth and redness with pain and fever.  Deformity or dislocation of the kneecap. DIAGNOSIS  Determining what is wrong may be very straight forward such as when there is an injury. It can also be challenging because of the complexity of the knee. Tests to make a diagnosis may include:  Your caregiver taking a history and doing a physical exam.  Routine X-rays can be used to rule out other problems. X-rays will not reveal a cartilage tear. Some injuries of the knee can be diagnosed by:  Arthroscopy a surgical technique by which a small video camera is inserted through tiny incisions on the sides of the knee. This procedure is used to examine and repair internal knee joint problems. Tiny instruments can be used during  arthroscopy to repair the torn knee cartilage (meniscus).  Arthrography is a radiology technique. A contrast liquid is directly injected into the knee joint. Internal structures of the knee joint then become visible on X-ray film.  An MRI scan is a non X-ray radiology procedure in which magnetic fields and a computer produce two- or three-dimensional images of the inside of the knee. Cartilage tears are often visible using an MRI scanner. MRI scans have largely replaced arthrography in diagnosing cartilage tears of the knee.  Blood work.  Examination of the fluid that helps to lubricate the knee joint (synovial fluid). This is done by taking a sample out using a needle and a syringe. TREATMENT The treatment of knee problems depends on the cause. Some of these treatments are:  Depending on the injury, proper casting, splinting, surgery, or physical therapy care will be needed.  Give yourself adequate recovery time. Do not overuse your joints. If you begin to get sore during workout routines, back off. Slow down or  do fewer repetitions.  For repetitive activities such as cycling or running, maintain your strength and nutrition.  Alternate muscle groups. For example, if you are a weight lifter, work the upper body on one day and the lower body the next.  Either tight or weak muscles do not give the proper support for your knee. Tight or weak muscles do not absorb the stress placed on the knee joint. Keep the muscles surrounding the knee strong.  Take care of mechanical problems.  If you have flat feet, orthotics or special shoes may help. See your caregiver if you need help.  Arch supports, sometimes with wedges on the inner or outer aspect of the heel, can help. These can shift pressure away from the side of the knee most bothered by osteoarthritis.  A brace called an "unloader" brace also may be used to help ease the pressure on the most arthritic side of the knee.  If your caregiver has  prescribed crutches, braces, wraps or ice, use as directed. The acronym for this is PRICE. This means protection, rest, ice, compression, and elevation.  Nonsteroidal anti-inflammatory drugs (NSAIDs), can help relieve pain. But if taken immediately after an injury, they may actually increase swelling. Take NSAIDs with food in your stomach. Stop them if you develop stomach problems. Do not take these if you have a history of ulcers, stomach pain, or bleeding from the bowel. Do not take without your caregiver's approval if you have problems with fluid retention, heart failure, or kidney problems.  For ongoing knee problems, physical therapy may be helpful.  Glucosamine and chondroitin are over-the-counter dietary supplements. Both may help relieve the pain of osteoarthritis in the knee. These medicines are different from the usual anti-inflammatory drugs. Glucosamine may decrease the rate of cartilage destruction.  Injections of a corticosteroid drug into your knee joint may help reduce the symptoms of an arthritis flare-up. They may provide pain relief that lasts a few months. You may have to wait a few months between injections. The injections do have a small increased risk of infection, water retention, and elevated blood sugar levels.  Hyaluronic acid injected into damaged joints may ease pain and provide lubrication. These injections may work by reducing inflammation. A series of shots may give relief for as long as 6 months.  Topical painkillers. Applying certain ointments to your skin may help relieve the pain and stiffness of osteoarthritis. Ask your pharmacist for suggestions. Many over the-counter products are approved for temporary relief of arthritis pain.  In some countries, doctors often prescribe topical NSAIDs for relief of chronic conditions such as arthritis and tendinitis. A review of treatment with NSAID creams found that they worked as well as oral medications but without the serious  side effects. PREVENTION  Maintain a healthy weight. Extra pounds put more strain on your joints.  Get strong, stay limber. Weak muscles are a common cause of knee injuries. Stretching is important. Include flexibility exercises in your workouts.  Be smart about exercise. If you have osteoarthritis, chronic knee pain or recurring injuries, you may need to change the way you exercise. This does not mean you have to stop being active. If your knees ache after jogging or playing basketball, consider switching to swimming, water aerobics, or other low-impact activities, at least for a few days a week. Sometimes limiting high-impact activities will provide relief.  Make sure your shoes fit well. Choose footwear that is right for your sport.  Protect your knees. Use the proper gear  for knee-sensitive activities. Use kneepads when playing volleyball or laying carpet. Buckle your seat belt every time you drive. Most shattered kneecaps occur in car accidents.  Rest when you are tired. SEEK MEDICAL CARE IF:  You have knee pain that is continual and does not seem to be getting better.  SEEK IMMEDIATE MEDICAL CARE IF:  Your knee joint feels hot to the touch and you have a high fever. MAKE SURE YOU:   Understand these instructions.  Will watch your condition.  Will get help right away if you are not doing well or get worse. Document Released: 04/09/2007 Document Revised: 09/04/2011 Document Reviewed: 04/09/2007 [facility] Patient Information 2015 [place], [place]. This information is not intended to replace advice given to you by your health care provider. Make sure you discuss any questions you have with your health care provider.

## 2014-08-26 NOTE — ED Notes (Signed)
Patient up getting dressed ready for discharge

## 2014-08-26 NOTE — ED Provider Notes (Signed)
CSN: [id number]     Arrival date & time 08/26/14  2015 History   First MD Initiated Contact with Patient 08/26/14 2145     Chief Complaint  Patient presents with  . Joint Swelling     (Consider location/radiation/quality/duration/timing/severity/associated sxs/prior Treatment) HPI Comments: Patient is a 64 year old female with history of HIV disease, hepatitis C. She presents for evaluation of lower extremity pain. She reports a 2 week history of swelling in her legs, weight gain, and knee pain. She states that this makes it difficult for her to walk. She also feels short of breath when ambulating. She denies any fevers or chills. She denies any chest pain.  She was seen here in the ER with similar complaints approximately one month ago. Her workup was inconclusive and she was prescribed Lasix which she apparently did not fill or lost the prescription.  Patient is a 64 y.o. female presenting with leg pain. The history is provided by the patient.  Leg Pain Location:  Knee Time since incident:  2 weeks Injury: no   Knee location:  L knee and R knee Pain details:    Quality:  Aching   Radiates to:  Does not radiate   Severity:  Moderate   Onset quality:  Sudden   Timing:  Constant   Progression:  Worsening Chronicity:  New Relieved by:  Nothing Worsened by:  Nothing tried   Past Medical History  Diagnosis Date  . HIV (human immunodeficiency virus infection)   . Hepatitis C   . Cancer    Past Surgical History  Procedure Laterality Date  . Colonoscopy N/A 09/20/2012    Procedure: COLONOSCOPY;  Surgeon: Beryle Beams, MD;  Location: [facility];  Service: Endoscopy;  Laterality: N/A;  . Colon resection N/A 09/23/2012    Procedure: LAPAROSCOPIC RIGHT COLON RESECTION   ;  Surgeon: Edward Jolly, MD;  Location: [facility];  Service: General;  Laterality: N/A;  LAPOROSCOPY, RIGHT HEMICOLECTOMY  . Colon surgery  09/23/2012    lap right colectomy  . Open reduction internal fixation  (orif) distal radial fracture Left 01/19/2014    Procedure: OPEN REDUCTION INTERNAL FIXATION (ORIF) LEFT  DISTAL RADIAL FRACTURE;  Surgeon: Schuyler Amor, MD;  Location: [place];  Service: Orthopedics;  Laterality: Left;  . Carpal tunnel release Left 01/19/2014    Procedure: LEFT CARPAL TUNNEL RELEASE;  Surgeon: Schuyler Amor, MD;  Location: [place];  Service: Orthopedics;  Laterality: Left;   Family History  Problem Relation Age of Onset  . Cancer Mother    History  Substance Use Topics  . Smoking status: Current Every Day Smoker -- 0.30 packs/day    Types: Cigarettes  . Smokeless tobacco: Not on file     Comment: trying to cut back  . Alcohol Use: No   OB History    No data available     Review of Systems  All other systems reviewed and are negative.     Allergies  Review of patient's allergies indicates no known allergies.  Home Medications   Prior to Admission medications   Medication Sig Start Date End Date Taking? Authorizing Provider  amitriptyline (ELAVIL) 50 MG tablet Take 1 tablet (50 mg total) by mouth at bedtime. 06/10/14   Michel Bickers, MD  dolutegravir (TIVICAY) 50 MG tablet Take 1 tablet (50 mg total) by mouth daily. 05/27/14   Michel Bickers, MD  emtricitabine-tenofovir (TRUVADA) 200-300 MG per tablet Take 1 tablet by mouth daily.  05/27/14   Michel Bickers, MD  furosemide (LASIX) 20 MG tablet Take 1 tablet (20 mg total) by mouth daily. Patient not taking: Reported on 07/30/2014 07/24/14   Carlisle Cater, PA-C  Ledipasvir-Sofosbuvir (HARVONI) 90-400 MG TABS Take 1 tablet by mouth daily with breakfast. 06/11/14   Michel Bickers, MD  oxyCODONE (ROXICODONE) 5 MG immediate release tablet Take 1 tablet (5 mg total) by mouth every 4 (four) hours as needed for severe pain. Patient not taking: Reported on 07/30/2014 07/24/14   Carlisle Cater, PA-C   BP 124/73 mmHg  Pulse 72  Temp(Src) 98.7 F (37.1 C) (Oral)  Resp 16  SpO2  99% Physical Exam  Constitutional: She is oriented to person, place, and time. She appears well-developed and well-nourished. No distress.  HENT:  Head: Normocephalic and atraumatic.  Neck: Normal range of motion. Neck supple.  Cardiovascular: Normal rate and regular rhythm.  Exam reveals no gallop and no friction rub.   No murmur heard. Pulmonary/Chest: Effort normal and breath sounds normal. No respiratory distress. She has no wheezes.  Abdominal: Soft. Bowel sounds are normal. She exhibits no distension. There is no tenderness.  Musculoskeletal: Normal range of motion. She exhibits edema.  There is 1-2+ pitting edema of the bilateral lower extremities.  The bilateral knees are without effusion. There is some discomfort with range of motion. The knees are stable anterior and posterior and there is no laxity with varus or valgus stress. There is no calf tenderness and Homans sign is absent bilaterally.  Neurological: She is alert and oriented to person, place, and time.  Skin: Skin is warm and dry. She is not diaphoretic.  Nursing note and vitals reviewed.   ED Course  Procedures (including critical care time) Labs Review Labs Reviewed  CBC WITH DIFFERENTIAL/PLATELET - Abnormal; Notable for the following:    WBC 3.3 (*)    MCV 103.0 (*)    Platelets 83 (*)    Neutro Abs 1.5 (*)    Monocytes Relative 15 (*)    All other components within normal limits  BASIC METABOLIC PANEL - Abnormal; Notable for the following:    Glucose, Bld 104 (*)    All other components within normal limits  BRAIN NATRIURETIC PEPTIDE    Imaging Review No results found.   EKG Interpretation None      MDM   Final diagnoses:  None    Patient is a 64 year old female with history of HIV disease who presents with pain in her knees, weight gain, and edema of her legs. Her workup reveals normal renal function, unremarkable electrolytes, and blood counts that are unremarkable as well. X-rays of the  knees reveals mild osteoarthritis but no acute process. She will be treated with Lasix, pain medication and follow-up with her primary doctor.    Veryl Speak, MD 08/26/14 [phone]

## 2014-08-26 NOTE — ED Notes (Signed)
Swelling and pain to bilateral knees x 2 weeks. Pt denies injury.

## 2014-08-26 NOTE — ED Notes (Signed)
Discharge instructions and prescriptions reviewed, voiced understanding. 

## 2014-09-07 ENCOUNTER — Ambulatory Visit: Payer: Medicaid Other | Admitting: Oncology

## 2014-09-07 ENCOUNTER — Other Ambulatory Visit: Payer: Medicaid Other

## 2014-09-08 ENCOUNTER — Other Ambulatory Visit: Payer: Medicaid Other

## 2014-09-08 ENCOUNTER — Ambulatory Visit: Payer: Medicaid Other | Admitting: Oncology

## 2014-09-15 ENCOUNTER — Encounter: Payer: Self-pay | Admitting: *Deleted

## 2014-09-15 ENCOUNTER — Other Ambulatory Visit: Payer: Medicaid Other

## 2014-09-15 DIAGNOSIS — Z79899 Other long term (current) drug therapy: Secondary | ICD-10-CM

## 2014-09-15 DIAGNOSIS — B1710 Acute hepatitis C without hepatic coma: Secondary | ICD-10-CM

## 2014-09-15 DIAGNOSIS — B20 Human immunodeficiency virus [HIV] disease: Secondary | ICD-10-CM

## 2014-09-15 DIAGNOSIS — Z113 Encounter for screening for infections with a predominantly sexual mode of transmission: Secondary | ICD-10-CM

## 2014-09-15 LAB — LIPID PANEL
CHOLESTEROL: 117 mg/dL (ref 0–200)
HDL: 52 mg/dL (ref >=46)
LDL Cholesterol: 58 mg/dL (ref 0–99)
Total CHOL/HDL Ratio: 2.3 Ratio
Triglycerides: 35 mg/dL (ref <150)
VLDL: 7 mg/dL (ref 0–40)

## 2014-09-15 LAB — CBC
HCT: 41.8 % (ref 36.0–46.0)
Hemoglobin: 14 g/dL (ref 12.0–15.0)
MCH: 33 pg (ref 26.0–34.0)
MCHC: 33.5 g/dL (ref 30.0–36.0)
MCV: 98.6 fL (ref 78.0–100.0)
MPV: 10.7 fL (ref 8.6–12.4)
PLATELETS: 110 10*3/uL — AB (ref 150–400)
RBC: 4.24 MIL/uL (ref 3.87–5.11)
RDW: 13.8 % (ref 11.5–15.5)
WBC: 2.5 10*3/uL — AB (ref 4.0–10.5)

## 2014-09-15 LAB — COMPREHENSIVE METABOLIC PANEL
ALBUMIN: 3.8 g/dL (ref 3.5–5.2)
ALK PHOS: 96 U/L (ref 39–117)
ALT: 17 U/L (ref 0–35)
AST: 25 U/L (ref 0–37)
BUN: 11 mg/dL (ref 6–23)
CO2: 35 mEq/L — ABNORMAL HIGH (ref 19–32)
CREATININE: 0.63 mg/dL (ref 0.50–1.10)
Calcium: 9 mg/dL (ref 8.4–10.5)
Chloride: 101 mEq/L (ref 96–112)
GLUCOSE: 116 mg/dL — AB (ref 70–99)
POTASSIUM: 3.9 meq/L (ref 3.5–5.3)
Sodium: 140 mEq/L (ref 135–145)
Total Bilirubin: 0.6 mg/dL (ref 0.2–1.2)
Total Protein: 7.1 g/dL (ref 6.0–8.3)

## 2014-09-15 NOTE — Progress Notes (Signed)
Patient ID: Ruth Stephenson, female   DOB: 02/24/1951, 64 y.o.   MRN: [id number] Pt continues to have abdominal and leg swelling.  Legs also causing her pain and "jerking" at night when she tries to rest.  Pt was given lasix by the ED but has not been taking it as prescribed.  She is also not taking the prescription for Elavil that Dr. Megan Salon prescribed at her last office visit.  Pt knows that she has an upcoming appointment with Dr. Stann Mainland at [facility] on 09/28/14.  She has lab work scheduled for 09/21/14 at [facility].  RN advised pt that her current problems need to be addressed by her PCP and that she should keep the upcoming appointments.  Pt verbalized that she would start taking the Lasix and the Elavil as prescribed and keep PCP appt.

## 2014-09-15 NOTE — Progress Notes (Signed)
Patient ID: Ruth Stephenson, female   DOB: 1951-01-25, 64 y.o.   MRN: [id number] She will also follow-up with me on 09/29/2014.

## 2014-09-16 LAB — RPR

## 2014-09-17 LAB — HEPATITIS C RNA QUANTITATIVE: HCV Quantitative: NOT DETECTED IU/mL (ref <15)

## 2014-09-17 LAB — T-HELPER CELL (CD4) - (RCID CLINIC ONLY)
CD4 % Helper T Cell: 24 % — ABNORMAL LOW (ref 33–55)
CD4 T Cell Abs: 230 /uL — ABNORMAL LOW (ref 400–2700)

## 2014-09-18 LAB — HIV-1 RNA QUANT-NO REFLEX-BLD
HIV 1 RNA Quant: 46 copies/mL — ABNORMAL HIGH (ref <20)
HIV-1 RNA QUANT, LOG: 1.66 {Log} — AB (ref <1.30)

## 2014-09-21 ENCOUNTER — Other Ambulatory Visit: Payer: Medicaid Other

## 2014-09-28 ENCOUNTER — Encounter: Payer: Medicaid Other | Admitting: Internal Medicine

## 2014-09-29 ENCOUNTER — Ambulatory Visit: Payer: Medicaid Other | Admitting: Internal Medicine

## 2014-10-27 ENCOUNTER — Telehealth: Payer: Self-pay | Admitting: *Deleted

## 2014-10-27 ENCOUNTER — Ambulatory Visit: Payer: Medicaid Other | Admitting: Internal Medicine

## 2014-10-27 NOTE — Telephone Encounter (Signed)
Made PM appt for the pt.

## 2014-11-30 ENCOUNTER — Encounter: Payer: Self-pay | Admitting: Internal Medicine

## 2014-11-30 ENCOUNTER — Ambulatory Visit (INDEPENDENT_AMBULATORY_CARE_PROVIDER_SITE_OTHER): Payer: Medicaid Other | Admitting: Internal Medicine

## 2014-11-30 DIAGNOSIS — Z21 Asymptomatic human immunodeficiency virus [HIV] infection status: Secondary | ICD-10-CM

## 2014-11-30 DIAGNOSIS — B20 Human immunodeficiency virus [HIV] disease: Secondary | ICD-10-CM

## 2014-11-30 DIAGNOSIS — B182 Chronic viral hepatitis C: Secondary | ICD-10-CM | POA: Diagnosis not present

## 2014-11-30 MED ORDER — ELVITEG-COBIC-EMTRICIT-TENOFAF 150-150-200-10 MG PO TABS: 1.0000 | ORAL_TABLET | Freq: Every day | ORAL | Status: DC

## 2014-11-30 NOTE — Progress Notes (Signed)
Patient ID: Ruth Stephenson, female   DOB: 17-Oct-1950, 64 y.o.   MRN: [id number]          Patient Active Problem List   Diagnosis Date Noted  . HIV (human immunodeficiency virus infection) 09/19/2012    Priority: High  . Cancer of right colon 10/24/2012    Priority: Medium  . Hepatitis C 09/19/2012    Priority: Medium  . Bilateral edema of lower extremity 07/29/2014  . History of colon cancer, stage II 07/29/2014  . Neuropathy 05/27/2014  . GERD (gastroesophageal reflux disease) 05/27/2014  . History of pancreatitis 09/19/2012  . Tobacco abuse 09/19/2012  . Anemia 09/19/2012    Patient's Medications  New Prescriptions   ELVITEGRAVIR-COBICISTAT-EMTRICITABINE-TENOFOVIR (GENVOYA) 150-150-200-10 MG TABS TABLET    Take 1 tablet by mouth daily with breakfast.  Previous Medications   AMITRIPTYLINE (ELAVIL) 50 MG TABLET    Take 1 tablet (50 mg total) by mouth at bedtime.   FUROSEMIDE (LASIX) 20 MG TABLET    Take 1 tablet (20 mg total) by mouth 2 (two) times daily.   HYDROCODONE-ACETAMINOPHEN (NORCO) 5-325 MG PER TABLET    Take 1-2 tablets by mouth every 6 (six) hours as needed.   OXYCODONE (ROXICODONE) 5 MG IMMEDIATE RELEASE TABLET    Take 1 tablet (5 mg total) by mouth every 4 (four) hours as needed for severe pain.  Modified Medications   No medications on file  Discontinued Medications   DOLUTEGRAVIR (TIVICAY) 50 MG TABLET    Take 1 tablet (50 mg total) by mouth daily.   EMTRICITABINE-TENOFOVIR (TRUVADA) 200-300 MG PER TABLET    Take 1 tablet by mouth daily.   LEDIPASVIR-SOFOSBUVIR (HARVONI) 90-400 MG TABS    Take 1 tablet by mouth daily with breakfast.    Subjective: Ruth Stephenson is in for her routine follow-up visit for HIV and hepatitis C. She has completed her course of therapy with [facility]. While on hepatitis C therapy she was taking Truvada and Tivicay for her HIV infection. She wants to switch back to a 1 pill daily regimen. She's had problems with weight gain over the past few  years. She states that she has never weighed this much. She has had problems with swelling in her feet that has caused it to be more difficult to walk. She also describes having dry skin and a rash on her feet. She states that she has not been getting nearly as much activity as she use to since her "children's father" returned home to live with her. She states that he pampers her. She spends most of her days sitting down watching television. Because of the discomfort and swelling in her legs and feet she recently stopped taking Truvada and Tivicay 5 days ago. She thinks she may be feeling better.  Review of Systems: Constitutional: positive for malaise, negative for anorexia, chills, fevers and weight loss Eyes: negative Ears, nose, mouth, throat, and face: negative Respiratory: negative Cardiovascular: negative Gastrointestinal: negative Genitourinary:negative  Past Medical History  Diagnosis Date  . HIV (human immunodeficiency virus infection)   . Hepatitis C   . Cancer     History  Substance Use Topics  . Smoking status: Current Every Day Smoker -- 0.30 packs/day    Types: Cigarettes  . Smokeless tobacco: Not on file     Comment: trying to cut back  . Alcohol Use: No    Family History  Problem Relation Age of Onset  . Cancer Mother     No Known Allergies  Objective:  Temp: 98.7 F (37.1 C) (06/06 1541) Temp Source: Oral (06/06 1541) BP: 128/80 mmHg (06/06 1541) Pulse Rate: 51 (06/06 1541) Body mass index is 33.67 kg/(m^2).  General: Her weight is up over 50 pounds in the past 2 years to 190  Lab Results Lab Results  Component Value Date   WBC 2.5* 09/15/2014   HGB 14.0 09/15/2014   HCT 41.8 09/15/2014   MCV 98.6 09/15/2014   PLT 110* 09/15/2014    Lab Results  Component Value Date   CREATININE 0.63 09/15/2014   BUN 11 09/15/2014   NA 140 09/15/2014   K 3.9 09/15/2014   CL 101 09/15/2014   CO2 35* 09/15/2014    Lab Results  Component Value Date   ALT  17 09/15/2014   AST 25 09/15/2014   ALKPHOS 96 09/15/2014   BILITOT 0.6 09/15/2014    Lab Results  Component Value Date   CHOL 117 09/15/2014   HDL 52 09/15/2014   LDLCALC 58 09/15/2014   TRIG 35 09/15/2014   CHOLHDL 2.3 09/15/2014    Lab Results HIV 1 RNA QUANT (copies/mL)  Date Value  09/15/2014 46*  04/02/2014 <20  07/14/2013 <20   CD4 T CELL ABS (/uL)  Date Value  09/15/2014 230*  04/02/2014 220*  07/14/2013 150*     Assessment: Her HIV viral load has reactivated slightly. I talked to her again about the importance of complete adherence with her medications. I will recheck lab work today and switch her to once daily Genvoya.  Her last 2 hepatitis C viral loads were undetectable. I will repeat it again today. I'm hopeful that her hepatitis C has been cured.  She has become much less physically active and has gained a great deal of weight. She has hyperglycemia and is at risk for full-blown diabetes. She has a strong family history of diabetes.  Plan: 1. Change Truvada and Tivicay to Genvoya 2. Check HIV viral load and CD4 count 3. Check a hepatitis C viral load 4. Increase physical activity 5. Follow-up in 2 weeks   Michel Bickers, MD [facility] for [place] [phone] pager   [phone] cell 11/30/2014, 4:11 PM

## 2014-12-01 LAB — COMPREHENSIVE METABOLIC PANEL
ALBUMIN: 3.4 g/dL — AB (ref 3.5–5.2)
ALT: 15 U/L (ref 0–35)
AST: 21 U/L (ref 0–37)
Alkaline Phosphatase: 96 U/L (ref 39–117)
BILIRUBIN TOTAL: 0.5 mg/dL (ref 0.2–1.2)
BUN: 5 mg/dL — AB (ref 6–23)
CO2: 29 mEq/L (ref 19–32)
Calcium: 8.9 mg/dL (ref 8.4–10.5)
Chloride: 104 mEq/L (ref 96–112)
Creat: 0.62 mg/dL (ref 0.50–1.10)
Glucose, Bld: 104 mg/dL — ABNORMAL HIGH (ref 70–99)
Potassium: 4.2 mEq/L (ref 3.5–5.3)
Sodium: 140 mEq/L (ref 135–145)
Total Protein: 6.9 g/dL (ref 6.0–8.3)

## 2014-12-01 LAB — CBC
HEMATOCRIT: 43.5 % (ref 36.0–46.0)
Hemoglobin: 14.7 g/dL (ref 12.0–15.0)
MCH: 33.1 pg (ref 26.0–34.0)
MCHC: 33.8 g/dL (ref 30.0–36.0)
MCV: 98 fL (ref 78.0–100.0)
MPV: 10.6 fL (ref 8.6–12.4)
PLATELETS: 81 10*3/uL — AB (ref 150–400)
RBC: 4.44 MIL/uL (ref 3.87–5.11)
RDW: 14.2 % (ref 11.5–15.5)
WBC: 2.4 10*3/uL — ABNORMAL LOW (ref 4.0–10.5)

## 2014-12-01 LAB — HEPATITIS C RNA QUANTITATIVE: HCV Quantitative: NOT DETECTED IU/mL (ref <15)

## 2014-12-02 LAB — HIV-1 RNA QUANT-NO REFLEX-BLD: HIV-1 RNA Quant, Log: <1.3 {Log} (ref <1.30)

## 2014-12-02 LAB — T-HELPER CELL (CD4) - (RCID CLINIC ONLY)
CD4 % Helper T Cell: 24 % — ABNORMAL LOW (ref 33–55)
CD4 T CELL ABS: 230 /uL — AB (ref 400–2700)

## 2014-12-22 ENCOUNTER — Ambulatory Visit: Payer: Medicaid Other | Admitting: Internal Medicine

## 2015-01-13 ENCOUNTER — Encounter: Payer: Self-pay | Admitting: Internal Medicine

## 2015-01-13 ENCOUNTER — Ambulatory Visit (INDEPENDENT_AMBULATORY_CARE_PROVIDER_SITE_OTHER): Payer: Medicaid Other | Admitting: Internal Medicine

## 2015-01-13 DIAGNOSIS — Z21 Asymptomatic human immunodeficiency virus [HIV] infection status: Secondary | ICD-10-CM | POA: Diagnosis present

## 2015-01-13 DIAGNOSIS — B20 Human immunodeficiency virus [HIV] disease: Secondary | ICD-10-CM

## 2015-01-13 NOTE — Progress Notes (Signed)
Patient ID: Ruth Stephenson, female   DOB: 1950-07-09, 64 y.o.   MRN: [id number]          Patient Active Problem List   Diagnosis Date Noted  . HIV (human immunodeficiency virus infection) 09/19/2012    Priority: High  . Cancer of right colon 10/24/2012    Priority: Medium  . Hepatitis C 09/19/2012    Priority: Medium  . Bilateral edema of lower extremity 07/29/2014  . History of colon cancer, stage II 07/29/2014  . Neuropathy 05/27/2014  . GERD (gastroesophageal reflux disease) 05/27/2014  . History of pancreatitis 09/19/2012  . Tobacco abuse 09/19/2012  . Anemia 09/19/2012    Patient's Medications  New Prescriptions   No medications on file  Previous Medications   AMITRIPTYLINE (ELAVIL) 50 MG TABLET    Take 1 tablet (50 mg total) by mouth at bedtime.   ELVITEGRAVIR-COBICISTAT-EMTRICITABINE-TENOFOVIR (GENVOYA) 150-150-200-10 MG TABS TABLET    Take 1 tablet by mouth daily with breakfast.   FUROSEMIDE (LASIX) 20 MG TABLET    Take 1 tablet (20 mg total) by mouth 2 (two) times daily.   HYDROCODONE-ACETAMINOPHEN (NORCO) 5-325 MG PER TABLET    Take 1-2 tablets by mouth every 6 (six) hours as needed.   OXYCODONE (ROXICODONE) 5 MG IMMEDIATE RELEASE TABLET    Take 1 tablet (5 mg total) by mouth every 4 (four) hours as needed for severe pain.  Modified Medications   No medications on file  Discontinued Medications   No medications on file    Subjective: Ruth Stephenson is in for her routine follow-up visit. Last month I switched her HIV retroviral therapy to [facility]. She states that she likes it and feels better. She takes it each evening with dinner. She has not missed any doses. She is still smoking cigarettes. She is most bothered now by the swelling in her legs. It is worse in her left leg. She does not have any swelling when she first gets up in the morning but it gets gradually worse throughout the day. He can cause pain in her legs. Her mother and sisters have similar problems. Review  of Systems: Constitutional: negative for anorexia, fevers and weight loss Eyes: negative Ears, nose, mouth, throat, and face: negative Respiratory: negative Cardiovascular: negative Gastrointestinal: negative Genitourinary:negative  Past Medical History  Diagnosis Date  . HIV (human immunodeficiency virus infection)   . Hepatitis C   . Cancer     History  Substance Use Topics  . Smoking status: Current Every Day Smoker -- 0.30 packs/day    Types: Cigarettes  . Smokeless tobacco: Not on file     Comment: trying to cut back  . Alcohol Use: No    Family History  Problem Relation Age of Onset  . Cancer Mother     No Known Allergies  Objective: Temp: 98.1 F (36.7 C) (07/20 1551) Temp Source: Oral (07/20 1551) BP: 114/78 mmHg (07/20 1551) Pulse Rate: 79 (07/20 1551) Body mass index is 33.62 kg/(m^2).  General: Her weight is stable at 189.75 pounds Skin: No rash Lungs: clear Cor: reg S1 and S2 without murmurs Abdomen: central adiposity Joints and extremities: 2+ pitting edema in lower legs. She has dusky redness of her left leg without warmth  Lab Results Lab Results  Component Value Date   WBC 2.4* 11/30/2014   HGB 14.7 11/30/2014   HCT 43.5 11/30/2014   MCV 98.0 11/30/2014   PLT 81* 11/30/2014    Lab Results  Component Value Date   CREATININE  0.62 11/30/2014   BUN 5* 11/30/2014   NA 140 11/30/2014   K 4.2 11/30/2014   CL 104 11/30/2014   CO2 29 11/30/2014    Lab Results  Component Value Date   ALT 15 11/30/2014   AST 21 11/30/2014   ALKPHOS 96 11/30/2014   BILITOT 0.5 11/30/2014    Lab Results  Component Value Date   CHOL 117 09/15/2014   HDL 52 09/15/2014   LDLCALC 58 09/15/2014   TRIG 35 09/15/2014   CHOLHDL 2.3 09/15/2014    Lab Results HIV 1 RNA QUANT (copies/mL)  Date Value  11/30/2014 <20  09/15/2014 46*  04/02/2014 <20   CD4 T CELL ABS (/uL)  Date Value  11/30/2014 230*  09/15/2014 230*  04/02/2014 220*      Assessment: Her HIV infection is under excellent control. I will continue Genvoya.  Her hepatitis C has been cured.  I suspect that her pedal edema is related to chronic venous insufficiency. I encouraged her to return to see her primary care provider. She has tried compressive stockings in the past.  I encouraged her to think more about the importance of quitting cigarettes completely.  Plan: 1. Continue Genvoya 2. Cigarette cessation counseling provided 3. Follow-up. After lab work in [place] months   Michel Bickers, MD [facility] [place] [phone] pager   [phone] cell 01/13/2015, 4:30 PM

## 2015-03-25 ENCOUNTER — Encounter (HOSPITAL_COMMUNITY): Payer: Self-pay | Admitting: Emergency Medicine

## 2015-03-25 ENCOUNTER — Emergency Department (HOSPITAL_COMMUNITY): Payer: Medicaid Other

## 2015-03-25 ENCOUNTER — Emergency Department (HOSPITAL_COMMUNITY)
Admission: EM | Admit: 2015-03-25 | Discharge: 2015-03-26 | Disposition: A | Discharge status: Home / Self Care | Payer: Medicaid Other | Attending: Physician Assistant | Admitting: Physician Assistant

## 2015-03-25 DIAGNOSIS — J159 Unspecified bacterial pneumonia: Secondary | ICD-10-CM | POA: Diagnosis not present

## 2015-03-25 DIAGNOSIS — M79641 Pain in right hand: Secondary | ICD-10-CM | POA: Diagnosis not present

## 2015-03-25 DIAGNOSIS — M62830 Muscle spasm of back: Secondary | ICD-10-CM | POA: Diagnosis not present

## 2015-03-25 DIAGNOSIS — Z85038 Personal history of other malignant neoplasm of large intestine: Secondary | ICD-10-CM | POA: Insufficient documentation

## 2015-03-25 DIAGNOSIS — M7989 Other specified soft tissue disorders: Secondary | ICD-10-CM

## 2015-03-25 DIAGNOSIS — R635 Abnormal weight gain: Secondary | ICD-10-CM | POA: Insufficient documentation

## 2015-03-25 DIAGNOSIS — Z21 Asymptomatic human immunodeficiency virus [HIV] infection status: Secondary | ICD-10-CM | POA: Diagnosis not present

## 2015-03-25 DIAGNOSIS — R110 Nausea: Secondary | ICD-10-CM | POA: Insufficient documentation

## 2015-03-25 DIAGNOSIS — M79662 Pain in left lower leg: Secondary | ICD-10-CM | POA: Diagnosis not present

## 2015-03-25 DIAGNOSIS — M79661 Pain in right lower leg: Secondary | ICD-10-CM | POA: Diagnosis not present

## 2015-03-25 DIAGNOSIS — Z720 Tobacco use: Secondary | ICD-10-CM | POA: Insufficient documentation

## 2015-03-25 DIAGNOSIS — Z8619 Personal history of other infectious and parasitic diseases: Secondary | ICD-10-CM | POA: Insufficient documentation

## 2015-03-25 DIAGNOSIS — G8929 Other chronic pain: Secondary | ICD-10-CM | POA: Diagnosis not present

## 2015-03-25 DIAGNOSIS — M79606 Pain in leg, unspecified: Secondary | ICD-10-CM

## 2015-03-25 DIAGNOSIS — R600 Localized edema: Secondary | ICD-10-CM

## 2015-03-25 DIAGNOSIS — R2233 Localized swelling, mass and lump, upper limb, bilateral: Secondary | ICD-10-CM | POA: Insufficient documentation

## 2015-03-25 DIAGNOSIS — R05 Cough: Secondary | ICD-10-CM | POA: Diagnosis present

## 2015-03-25 DIAGNOSIS — M549 Dorsalgia, unspecified: Secondary | ICD-10-CM

## 2015-03-25 DIAGNOSIS — M79642 Pain in left hand: Secondary | ICD-10-CM | POA: Diagnosis not present

## 2015-03-25 DIAGNOSIS — Z79899 Other long term (current) drug therapy: Secondary | ICD-10-CM | POA: Diagnosis not present

## 2015-03-25 DIAGNOSIS — J189 Pneumonia, unspecified organism: Secondary | ICD-10-CM

## 2015-03-25 DIAGNOSIS — B20 Human immunodeficiency virus [HIV] disease: Secondary | ICD-10-CM

## 2015-03-25 LAB — CBC WITH DIFFERENTIAL/PLATELET
BASOS ABS: 0 10*3/uL (ref 0.0–0.1)
Basophils Relative: 0 %
Eosinophils Absolute: 0.1 10*3/uL (ref 0.0–0.7)
Eosinophils Relative: 2 %
HEMATOCRIT: 41.8 % (ref 36.0–46.0)
Hemoglobin: 14.1 g/dL (ref 12.0–15.0)
LYMPHS ABS: 1.1 10*3/uL (ref 0.7–4.0)
LYMPHS PCT: 39 %
MCH: 34.3 pg — AB (ref 26.0–34.0)
MCHC: 33.7 g/dL (ref 30.0–36.0)
MCV: 101.7 fL — ABNORMAL HIGH (ref 78.0–100.0)
MONO ABS: 0.3 10*3/uL (ref 0.1–1.0)
Monocytes Relative: 12 %
NEUTROS ABS: 1.3 10*3/uL — AB (ref 1.7–7.7)
Neutrophils Relative %: 47 %
Platelets: 99 10*3/uL — ABNORMAL LOW (ref 150–400)
RBC: 4.11 MIL/uL (ref 3.87–5.11)
RDW: 13.7 % (ref 11.5–15.5)
WBC: 2.8 10*3/uL — ABNORMAL LOW (ref 4.0–10.5)

## 2015-03-25 LAB — COMPREHENSIVE METABOLIC PANEL
ALT: 16 U/L (ref 14–54)
AST: 26 U/L (ref 15–41)
Albumin: 3.3 g/dL — ABNORMAL LOW (ref 3.5–5.0)
Alkaline Phosphatase: 82 U/L (ref 38–126)
Anion gap: 6 (ref 5–15)
BILIRUBIN TOTAL: 0.6 mg/dL (ref 0.3–1.2)
BUN: 9 mg/dL (ref 6–20)
CO2: 30 mmol/L (ref 22–32)
CREATININE: 0.72 mg/dL (ref 0.44–1.00)
Calcium: 8.6 mg/dL — ABNORMAL LOW (ref 8.9–10.3)
Chloride: 102 mmol/L (ref 101–111)
Glucose, Bld: 98 mg/dL (ref 65–99)
Potassium: 4.2 mmol/L (ref 3.5–5.1)
Sodium: 138 mmol/L (ref 135–145)
Total Protein: 7.4 g/dL (ref 6.5–8.1)

## 2015-03-25 LAB — URINE MICROSCOPIC-ADD ON

## 2015-03-25 LAB — URINALYSIS, ROUTINE W REFLEX MICROSCOPIC
Bilirubin Urine: NEGATIVE
Glucose, UA: NEGATIVE mg/dL
HGB URINE DIPSTICK: NEGATIVE
Ketones, ur: NEGATIVE mg/dL
Nitrite: NEGATIVE
PH: 8 (ref 5.0–8.0)
PROTEIN: NEGATIVE mg/dL
Specific Gravity, Urine: 1.019 (ref 1.005–1.030)
Urobilinogen, UA: 1 mg/dL (ref 0.0–1.0)

## 2015-03-25 LAB — BRAIN NATRIURETIC PEPTIDE: B Natriuretic Peptide: 113.5 pg/mL — ABNORMAL HIGH (ref 0.0–100.0)

## 2015-03-25 MED ORDER — SULFAMETHOXAZOLE-TRIMETHOPRIM 800-160 MG PO TABS: 2.0000 | ORAL_TABLET | Freq: Three times a day (TID) | ORAL | Status: DC

## 2015-03-25 MED ORDER — FUROSEMIDE 20 MG PO TABS: 20.0000 mg | ORAL_TABLET | Freq: Every day | ORAL | Status: DC

## 2015-03-25 MED ORDER — IPRATROPIUM BROMIDE 0.02 % IN SOLN
0.5000 mg | Freq: Once | RESPIRATORY_TRACT | Status: AC
  Administered 2015-03-25: 0.5 mg via RESPIRATORY_TRACT
  Filled 2015-03-25: qty 2.5

## 2015-03-25 MED ORDER — ONDANSETRON 4 MG PO TBDP
8.0000 mg | ORAL_TABLET | Freq: Once | ORAL | Status: AC
  Administered 2015-03-25: 8 mg via ORAL
  Filled 2015-03-25: qty 2

## 2015-03-25 MED ORDER — HYDROCODONE-ACETAMINOPHEN 5-325 MG PO TABS
1.0000 | ORAL_TABLET | Freq: Once | ORAL | Status: AC
Start: 2015-03-25 — End: 2015-03-25
  Administered 2015-03-25: 1 via ORAL
  Filled 2015-03-25: qty 1

## 2015-03-25 MED ORDER — ALBUTEROL SULFATE (2.5 MG/3ML) 0.083% IN NEBU
5.0000 mg | INHALATION_SOLUTION | Freq: Once | RESPIRATORY_TRACT | Status: AC
  Administered 2015-03-25: 5 mg via RESPIRATORY_TRACT
  Filled 2015-03-25: qty 6

## 2015-03-25 MED ORDER — ALBUTEROL SULFATE HFA 108 (90 BASE) MCG/ACT IN AERS: 2.0000 | INHALATION_SPRAY | RESPIRATORY_TRACT | Status: DC | PRN

## 2015-03-25 MED ORDER — ONDANSETRON HCL 8 MG PO TABS: 8.0000 mg | ORAL_TABLET | Freq: Three times a day (TID) | ORAL | Status: DC | PRN

## 2015-03-25 MED ORDER — HYDROCODONE-ACETAMINOPHEN 5-325 MG PO TABS: 1.0000 | ORAL_TABLET | Freq: Four times a day (QID) | ORAL | Status: DC | PRN

## 2015-03-25 NOTE — ED Notes (Signed)
Patient transported to X-ray 

## 2015-03-25 NOTE — ED Notes (Signed)
[facility] main lab states they will add on BMP. PA made aware.

## 2015-03-25 NOTE — Discharge Instructions (Signed)
Elevate your legs to help with swelling. Use compression stockings, and take lasix as directed. Use norco as directed as needed for pain but don't drive while taking norco. Use zofran as directed as needed for nausea. Stay well hydrated. Your chest xray was concerning for a possible pneumonia, take antibiotics as directed, use inhaler as directed as needed for chest congestion/shortness of breath. May consider using Mucinex for cough suppression/expectoration of mucus. Use netipot and flonase to help with nasal congestion. May consider over-the-counter Benadryl or other antihistamine to decrease secretions and for watery itchy eyes. STOP SMOKING! Followup with your primary care doctor in 3-5 days for recheck of ongoing symptoms. Return to emergency department for emergent changing or worsening of symptoms.   Edema Edema is an abnormal buildup of fluids. It is more common in your legs and thighs. Painless swelling of the feet and ankles is more likely as a person ages. It also is common in looser skin, like around your eyes. HOME CARE   Keep the affected body part above the level of the heart while lying down.  Do not sit still or stand for a long time.  Do not put anything right under your knees when you lie down.  Do not wear tight clothes on your upper legs.  Exercise your legs to help the puffiness (swelling) go down.  Wear elastic bandages or support stockings as told by your doctor.  A low-salt diet may help lessen the puffiness.  Only take medicine as told by your doctor. GET HELP IF:  Treatment is not working.  You have heart, liver, or kidney disease and notice that your skin looks puffy or shiny.  You have puffiness in your legs that does not get better when you raise your legs.  You have sudden weight gain for no reason. GET HELP RIGHT AWAY IF:   You have shortness of breath or chest pain.  You cannot breathe when you lie down.  You have pain, redness, or warmth in the  areas that are puffy.  You have heart, liver, or kidney disease and get edema all of a sudden.  You have a fever and your symptoms get worse all of a sudden. MAKE SURE YOU:   Understand these instructions.  Will watch your condition.  Will get help right away if you are not doing well or get worse. Document Released: 11/29/2007 Document Revised: 06/17/2013 Document Reviewed: 04/04/2013 [facility] Patient Information 2015 [place], [place]. This information is not intended to replace advice given to you by your health care provider. Make sure you discuss any questions you have with your health care provider.  Nausea, Adult Nausea is the feeling that you have an upset stomach or have to vomit. Nausea by itself is not likely a serious concern, but it may be an early sign of more serious medical problems. As nausea gets worse, it can lead to vomiting. If vomiting develops, there is the risk of dehydration.  CAUSES   Viral infections.  Food poisoning.  Medicines.  Pregnancy.  Motion sickness.  Migraine headaches.  Emotional distress.  Severe pain from any source.  Alcohol intoxication. HOME CARE INSTRUCTIONS  Get plenty of rest.  Ask your caregiver about specific rehydration instructions.  Eat small amounts of food and sip liquids more often.  Take all medicines as told by your caregiver. SEEK MEDICAL CARE IF:  You have not improved after 2 days, or you get worse.  You have a headache. SEEK IMMEDIATE MEDICAL CARE IF:  You have a fever.  You faint.  You keep vomiting or have blood in your vomit.  You are extremely weak or dehydrated.  You have dark or bloody stools.  You have severe chest or abdominal pain. MAKE SURE YOU:  Understand these instructions.  Will watch your condition.  Will get help right away if you are not doing well or get worse. Document Released: 07/20/2004 Document Revised: 03/06/2012 Document Reviewed: 02/22/2011 [facility] Patient  Information 2015 [place], [place]. This information is not intended to replace advice given to you by your health care provider. Make sure you discuss any questions you have with your health care provider.  Neuropathic Pain We often think that pain has a physical cause. If we get rid of the cause, the pain should go away. Nerves themselves can also cause pain. It is called neuropathic pain, which means nerve abnormality. It may be difficult for the patients who have it and for the treating caregivers. Pain is usually described as acute (short-lived) or chronic (long-lasting). Acute pain is related to the physical sensations caused by an injury. It can last from a few seconds to many weeks, but it usually goes away when normal healing occurs. Chronic pain lasts beyond the typical healing time. With neuropathic pain, the nerve fibers themselves may be damaged or injured. They then send incorrect signals to other pain centers. The pain you feel is real, but the cause is not easy to find.  CAUSES  Chronic pain can result from diseases, such as diabetes and shingles (an infection related to chickenpox), or from trauma, surgery, or amputation. It can also happen without any known injury or disease. The nerves are sending pain messages, even though there is no identifiable cause for such messages.   Other common causes of neuropathy include diabetes, phantom limb pain, or Regional Pain Syndrome (RPS).  As with all forms of chronic back pain, if neuropathy is not correctly treated, there can be a number of associated problems that lead to a downward cycle for the patient. These include depression, sleeplessness, feelings of fear and anxiety, limited social interaction and inability to do normal daily activities or work.  The most dramatic and mysterious example of neuropathic pain is called "phantom limb syndrome." This occurs when an arm or a leg has been removed because of illness or injury. The brain still gets  pain messages from the nerves that originally carried impulses from the missing limb. These nerves now seem to misfire and cause troubling pain.  Neuropathic pain often seems to have no cause. It responds poorly to standard pain treatment. Neuropathic pain can occur after:  Shingles (herpes zoster virus infection).  A lasting burning sensation of the skin, caused usually by injury to a peripheral nerve.  Peripheral neuropathy which is widespread nerve damage, often caused by diabetes or alcoholism.  Phantom limb pain following an amputation.  Facial nerve problems (trigeminal neuralgia).  Multiple sclerosis.  Reflex sympathetic dystrophy.  Pain which comes with cancer and cancer chemotherapy.  Entrapment neuropathy such as when pressure is put on a nerve such as in carpal tunnel syndrome.  Back, leg, and hip problems (sciatica).  Spine or back surgery.  HIV Infection or AIDS where nerves are infected by viruses. Your caregiver can explain items in the above list which may apply to you. SYMPTOMS  Characteristics of neuropathic pain are:  Severe, sharp, electric shock-like, shooting, lightening-like, knife-like.  Pins and needles sensation.  Deep burning, deep cold, or deep ache.  Persistent  numbness, tingling, or weakness.  Pain resulting from light touch or other stimulus that would not usually cause pain.  Increased sensitivity to something that would normally cause pain, such as a pinprick. Pain may persist for months or years following the healing of damaged tissues. When this happens, pain signals no longer sound an alarm about current injuries or injuries about to happen. Instead, the alarm system itself is not working correctly.  Neuropathic pain may get worse instead of better over time. For some people, it can lead to serious disability. It is important to be aware that severe injury in a limb can occur without a proper, protective pain response.Burns, cuts, and  other injuries may go unnoticed. Without proper treatment, these injuries can become infected or lead to further disability. Take any injury seriously, and consult your caregiver for treatment. DIAGNOSIS  When you have a pain with no known cause, your caregiver will probably ask some specific questions:   Do you have any other conditions, such as diabetes, shingles, multiple sclerosis, or HIV infection?  How would you describe your pain? (Neuropathic pain is often described as shooting, stabbing, burning, or searing.)  Is your pain worse at any time of the day? (Neuropathic pain is usually worse at night.)  Does the pain seem to follow a certain physical pathway?  Does the pain come from an area that has missing or injured nerves? (An example would be phantom limb pain.)  Is the pain triggered by minor things such as rubbing against the sheets at night? These questions often help define the type of pain involved. Once your caregiver knows what is happening, treatment can begin. Anticonvulsant, antidepressant drugs, and various pain relievers seem to work in some cases. If another condition, such as diabetes is involved, better management of that disorder may relieve the neuropathic pain.  TREATMENT  Neuropathic pain is frequently long-lasting and tends not to respond to treatment with narcotic type pain medication. It may respond well to other drugs such as antiseizure and antidepressant medications. Usually, neuropathic problems do not completely go away, but partial improvement is often possible with proper treatment. Your caregivers have large numbers of medications available to treat you. Do not be discouraged if you do not get immediate relief. Sometimes different medications or a combination of medications will be tried before you receive the results you are hoping for. See your caregiver if you have pain that seems to be coming from nowhere and does not go away. Help is available.  SEEK  IMMEDIATE MEDICAL CARE IF:   There is a sudden change in the quality of your pain, especially if the change is on only one side of the body.  You notice changes of the skin, such as redness, black or purple discoloration, swelling, or an ulcer.  You cannot move the affected limbs. Document Released: 03/09/2004 Document Revised: 09/04/2011 Document Reviewed: 03/09/2004 [facility] Patient Information 2015 [place], [place]. This information is not intended to replace advice given to you by your health care provider. Make sure you discuss any questions you have with your health care provider.  Pneumonia Pneumonia is an infection of the lungs.  CAUSES Pneumonia may be caused by bacteria or a virus. Usually, these infections are caused by breathing infectious particles into the lungs (respiratory tract). SIGNS AND SYMPTOMS   Cough.  Fever.  Chest pain.  Increased rate of breathing.  Wheezing.  Mucus production. DIAGNOSIS  If you have the common symptoms of pneumonia, your health care provider will  typically confirm the diagnosis with a chest X-ray. The X-ray will show an abnormality in the lung (pulmonary infiltrate) if you have pneumonia. Other tests of your blood, urine, or sputum may be done to find the specific cause of your pneumonia. Your health care provider may also do tests (blood gases or pulse oximetry) to see how well your lungs are working. TREATMENT  Some forms of pneumonia may be spread to other people when you cough or sneeze. You may be asked to wear a mask before and during your exam. Pneumonia that is caused by bacteria is treated with antibiotic medicine. Pneumonia that is caused by the influenza virus may be treated with an antiviral medicine. Most other viral infections must run their course. These infections will not respond to antibiotics.  HOME CARE INSTRUCTIONS   Cough suppressants may be used if you are losing too much rest. However, coughing protects you by  clearing your lungs. You should avoid using cough suppressants if you can.  Your health care provider may have prescribed medicine if he or she thinks your pneumonia is caused by bacteria or influenza. Finish your medicine even if you start to feel better.  Your health care provider may also prescribe an expectorant. This loosens the mucus to be coughed up.  Take medicines only as directed by your health care provider.  Do not smoke. Smoking is a common cause of bronchitis and can contribute to pneumonia. If you are a smoker and continue to smoke, your cough may last several weeks after your pneumonia has cleared.  A cold steam vaporizer or humidifier in your room or home may help loosen mucus.  Coughing is often worse at night. Sleeping in a semi-upright position in a recliner or using a couple pillows under your head will help with this.  Get rest as you feel it is needed. Your body will usually let you know when you need to rest. PREVENTION A pneumococcal shot (vaccine) is available to prevent a common bacterial cause of pneumonia. This is usually suggested for:  People over 6 years old.  Patients on chemotherapy.  People with chronic lung problems, such as bronchitis or emphysema.  People with immune system problems. If you are over 65 or have a high risk condition, you may receive the pneumococcal vaccine if you have not received it before. In some countries, a routine influenza vaccine is also recommended. This vaccine can help prevent some cases of pneumonia.You may be offered the influenza vaccine as part of your care. If you smoke, it is time to quit. You may receive instructions on how to stop smoking. Your health care provider can provide medicines and counseling to help you quit. SEEK MEDICAL CARE IF: You have a fever. SEEK IMMEDIATE MEDICAL CARE IF:   Your illness becomes worse. This is especially true if you are elderly or weakened from any other disease.  You cannot  control your cough with suppressants and are losing sleep.  You begin coughing up blood.  You develop pain which is getting worse or is uncontrolled with medicines.  Any of the symptoms which initially brought you in for treatment are getting worse rather than better.  You develop shortness of breath or chest pain. MAKE SURE YOU:   Understand these instructions.  Will watch your condition.  Will get help right away if you are not doing well or get worse. Document Released: 06/12/2005 Document Revised: 10/27/2013 Document Reviewed: 09/01/2010 [facility] Patient Information 2015 [place], [place]. This information is  not intended to replace advice given to you by your health care provider. Make sure you discuss any questions you have with your health care provider.  Smoking Cessation, Tips for Success If you are ready to quit smoking, congratulations! You have chosen to help yourself be healthier. Cigarettes bring nicotine, tar, carbon monoxide, and other irritants into your body. Your lungs, heart, and blood vessels will be able to work better without these poisons. There are many different ways to quit smoking. Nicotine gum, nicotine patches, a nicotine inhaler, or nicotine nasal spray can help with physical craving. Hypnosis, support groups, and medicines help break the habit of smoking. WHAT THINGS CAN I DO TO MAKE QUITTING EASIER?  Here are some tips to help you quit for good:  Pick a date when you will quit smoking completely. Tell all of your friends and family about your plan to quit on that date.  Do not try to slowly cut down on the number of cigarettes you are smoking. Pick a quit date and quit smoking completely starting on that day.  Throw away all cigarettes.   Clean and remove all ashtrays from your home, work, and car.  On a card, write down your reasons for quitting. Carry the card with you and read it when you get the urge to smoke.  Cleanse your body of nicotine. Drink  enough water and fluids to keep your urine clear or pale yellow. Do this after quitting to flush the nicotine from your body.  Learn to predict your moods. Do not let a bad situation be your excuse to have a cigarette. Some situations in your life might tempt you into wanting a cigarette.  Never have "just one" cigarette. It leads to wanting another and another. Remind yourself of your decision to quit.  Change habits associated with smoking. If you smoked while driving or when feeling stressed, try other activities to replace smoking. Stand up when drinking your coffee. Brush your teeth after eating. Sit in a different chair when you read the paper. Avoid alcohol while trying to quit, and try to drink fewer caffeinated beverages. Alcohol and caffeine may urge you to smoke.  Avoid foods and drinks that can trigger a desire to smoke, such as sugary or spicy foods and alcohol.  Ask people who smoke not to smoke around you.  Have something planned to do right after eating or having a cup of coffee. For example, plan to take a walk or exercise.  Try a relaxation exercise to calm you down and decrease your stress. Remember, you may be tense and nervous for the first 2 weeks after you quit, but this will pass.  Find new activities to keep your hands busy. Play with a pen, coin, or rubber band. Doodle or draw things on paper.  Brush your teeth right after eating. This will help cut down on the craving for the taste of tobacco after meals. You can also try mouthwash.   Use oral substitutes in place of cigarettes. Try using lemon drops, carrots, cinnamon sticks, or chewing gum. Keep them handy so they are available when you have the urge to smoke.  When you have the urge to smoke, try deep breathing.  Designate your home as a nonsmoking area.  If you are a heavy smoker, ask your health care provider about a prescription for nicotine chewing gum. It can ease your withdrawal from nicotine.  Reward  yourself. Set aside the cigarette money you save and buy yourself something  nice.  Look for support from others. Join a support group or smoking cessation program. Ask someone at home or at work to help you with your plan to quit smoking.  Always ask yourself, "Do I need this cigarette or is this just a reflex?" Tell yourself, "Today, I choose not to smoke," or "I do not want to smoke." You are reminding yourself of your decision to quit.  Do not replace cigarette smoking with electronic cigarettes (commonly called e-cigarettes). The safety of e-cigarettes is unknown, and some may contain harmful chemicals.  If you relapse, do not give up! Plan ahead and think about what you will do the next time you get the urge to smoke. HOW WILL I FEEL WHEN I QUIT SMOKING? You may have symptoms of withdrawal because your body is used to nicotine (the addictive substance in cigarettes). You may crave cigarettes, be irritable, feel very hungry, cough often, get headaches, or have difficulty concentrating. The withdrawal symptoms are only temporary. They are strongest when you first quit but will go away within 10-14 days. When withdrawal symptoms occur, stay in control. Think about your reasons for quitting. Remind yourself that these are signs that your body is healing and getting used to being without cigarettes. Remember that withdrawal symptoms are easier to treat than the major diseases that smoking can cause.  Even after the withdrawal is over, expect periodic urges to smoke. However, these cravings are generally short lived and will go away whether you smoke or not. Do not smoke! WHAT RESOURCES ARE AVAILABLE TO HELP ME QUIT SMOKING? Your health care provider can direct you to community resources or hospitals for support, which may include:  Group support.  Education.  Hypnosis.  Therapy. Document Released: 03/10/2004 Document Revised: 10/27/2013 Document Reviewed: 11/28/2012 [facility] Patient  Information 2015 [place], [place]. This information is not intended to replace advice given to you by your health care provider. Make sure you discuss any questions you have with your health care provider.

## 2015-03-25 NOTE — ED Provider Notes (Signed)
CSN: [id number]     Arrival date & time 03/25/15  2018 History   First MD Initiated Contact with Patient 03/25/15 2104     Chief Complaint  Patient presents with  . Leg Swelling  . Back Pain     (Consider location/radiation/quality/duration/timing/severity/associated sxs/prior Treatment) HPI Comments: Ruth Stephenson is a 64 y.o. female with a PMHx of HIV, hep C, and colon cancer s/p resection, who presents to the ED with complaints of bilateral lower extremity and hand swelling 3 days. She reports the symptoms improved with elevation and worse with standing. Associated symptoms include 20 pound weight gain in 3 days, nausea, and 10/10 bilateral lower extremity burning type pain which is constant nonradiating worse with increased swelling and with no treatments tried prior to arrival. She also has some abdominal pain that comes and goes, is currently resolved. She is also complaining of one month of right lower back pain. No trauma or injuries. She states previously she has been on a fluid pill for similar symptoms, but she stopped taking this when her regular doctor told her to stop taking it. She also noticed a cough at night with some wheezing intermittently throughout the day. She is a smoker. No history of asthma or COPD.  She denies any fevers, chills, chest pain, shortness of breath, ongoing abdominal pain, vomiting, diarrhea, constipation, melena, hematochezia, dysuria, hematuria, vaginal bleeding or discharge, numbness, tingling, or weakness. Denies any claudication orthopnea. Denies any recent travel, surgeries, immobilization, or history of DVT/PE.  Last CD4 count 230 on 11/30/14. Compliant with HIV regimen. HIV doctor is Dr. Megan Salon.  Patient is a 64 y.o. female presenting with leg pain. The history is provided by the patient. No language interpreter was used.  Leg Pain Location:  Leg Time since incident:  3 days Injury: no   Leg location:  R lower leg and L lower leg Pain details:     Quality:  Burning   Radiates to:  Does not radiate   Severity:  Severe   Onset quality:  Gradual   Duration:  3 days   Timing:  Constant   Progression:  Unchanged Chronicity:  Recurrent Prior injury to area:  No Relieved by:  Elevation Exacerbated by: standing. Ineffective treatments:  None tried Associated symptoms: back pain (x1 month) and swelling   Associated symptoms: no decreased ROM, no fever, no muscle weakness, no numbness and no tingling     Past Medical History  Diagnosis Date  . HIV (human immunodeficiency virus infection)   . Hepatitis C   . Cancer    Past Surgical History  Procedure Laterality Date  . Colonoscopy N/A 09/20/2012    Procedure: COLONOSCOPY;  Surgeon: Beryle Beams, MD;  Location: [facility];  Service: Endoscopy;  Laterality: N/A;  . Colon resection N/A 09/23/2012    Procedure: LAPAROSCOPIC RIGHT COLON RESECTION   ;  Surgeon: Edward Jolly, MD;  Location: [facility];  Service: General;  Laterality: N/A;  LAPOROSCOPY, RIGHT HEMICOLECTOMY  . Colon surgery  09/23/2012    lap right colectomy  . Open reduction internal fixation (orif) distal radial fracture Left 01/19/2014    Procedure: OPEN REDUCTION INTERNAL FIXATION (ORIF) LEFT  DISTAL RADIAL FRACTURE;  Surgeon: Schuyler Amor, MD;  Location: [place];  Service: Orthopedics;  Laterality: Left;  . Carpal tunnel release Left 01/19/2014    Procedure: LEFT CARPAL TUNNEL RELEASE;  Surgeon: Schuyler Amor, MD;  Location: [place];  Service: Orthopedics;  Laterality:  Left;   Family History  Problem Relation Age of Onset  . Cancer Mother    Social History  Substance Use Topics  . Smoking status: Current Every Day Smoker -- 0.30 packs/day    Types: Cigarettes  . Smokeless tobacco: None     Comment: trying to cut back  . Alcohol Use: No   OB History    No data available     Review of Systems  Constitutional: Positive for unexpected weight change. Negative  for fever and chills.  Respiratory: Positive for cough and wheezing. Negative for shortness of breath.   Cardiovascular: Positive for leg swelling (bilateral). Negative for chest pain.  Gastrointestinal: Positive for nausea and abdominal pain (intermittent, currently resolved). Negative for vomiting, diarrhea, constipation and blood in stool.  Genitourinary: Negative for dysuria, hematuria, flank pain, vaginal bleeding and vaginal discharge.  Musculoskeletal: Positive for myalgias (BLE) and back pain (x1 month). Negative for arthralgias.  Skin: Negative for color change.  Allergic/Immunologic: Negative for immunocompromised state.  Neurological: Negative for weakness and numbness.  Psychiatric/Behavioral: Negative for confusion.   10 Systems reviewed and are negative for acute change except as noted in the HPI.    Allergies  Review of patient's allergies indicates no known allergies.  Home Medications   Prior to Admission medications   Medication Sig Start Date End Date Taking? Authorizing Provider  amitriptyline (ELAVIL) 50 MG tablet Take 1 tablet (50 mg total) by mouth at bedtime. 06/10/14   Michel Bickers, MD  elvitegravir-cobicistat-emtricitabine-tenofovir (GENVOYA) 150-150-200-10 MG TABS tablet Take 1 tablet by mouth daily with breakfast. 11/30/14   Michel Bickers, MD  furosemide (LASIX) 20 MG tablet Take 1 tablet (20 mg total) by mouth 2 (two) times daily. 08/26/14   Veryl Speak, MD  HYDROcodone-acetaminophen (NORCO) 5-325 MG per tablet Take 1-2 tablets by mouth every 6 (six) hours as needed. 08/26/14   Veryl Speak, MD  oxyCODONE (ROXICODONE) 5 MG immediate release tablet Take 1 tablet (5 mg total) by mouth every 4 (four) hours as needed for severe pain. 07/24/14   Carlisle Cater, PA-C   BP 114/76 mmHg  Pulse 76  Temp(Src) 98.1 F (36.7 C) (Oral)  Resp 20  Ht 5\' 3"  (1.6 m)  Wt 205 lb (92.987 kg)  BMI 36.32 kg/m2  SpO2 95% Physical Exam  Constitutional: She is oriented to person,  place, and time. Vital signs are normal. She appears well-developed and well-nourished.  Non-toxic appearance. No distress.  Afebrile, nontoxic, NAD  HENT:  Head: Normocephalic and atraumatic.  Mouth/Throat: Oropharynx is clear and moist and mucous membranes are normal.  Eyes: Conjunctivae and EOM are normal. Right eye exhibits no discharge. Left eye exhibits no discharge.  Neck: Normal range of motion. Neck supple. No JVD present.  Cardiovascular: Normal rate, regular rhythm, normal heart sounds and intact distal pulses.  Exam reveals no gallop and no friction rub.   No murmur heard. Pulses:      Dorsalis pedis pulses are 2+ on the right side, and 2+ on the left side.  RRR, nl s1/s2, no m/r/g, distal pulses intact, trace to 1+ b/l pedal edema   Pulmonary/Chest: Effort normal. No respiratory distress. She has no decreased breath sounds. She has wheezes. She has no rhonchi. She has no rales.  Expiratory wheezing diffusely throughout, no rhonchi or rales, no hypoxia or increased WOB, speaking in full sentences, SpO2 97% on RA   Abdominal: Soft. Normal appearance and bowel sounds are normal. She exhibits no distension. There is no tenderness.  There is no rigidity, no rebound, no guarding, no CVA tenderness, no tenderness at McBurney's point and negative Murphy's sign.  Soft, NTND, +BS throughout, no r/g/r, neg murphy's, neg mcburney's, no CVA TTP   Musculoskeletal: Normal range of motion.       Lumbar back: She exhibits tenderness and spasm. She exhibits normal range of motion, no bony tenderness and no deformity.       Back:  Lumbar spine with FROM intact without spinous process TTP, no bony stepoffs or deformities, mild R sided paraspinous muscle TTP with palpable muscle spasms. Strength 5/5 in all extremities, sensation grossly intact in all extremities, negative SLR bilaterally, gait steady and nonantalgic. No overlying skin changes.   Neurological: She is alert and oriented to person, place,  and time. She has normal strength. No sensory deficit.  Skin: Skin is warm, dry and intact. No rash noted.  BLE trace to 1+ pitting edema with brawny discoloration up to mid-calf, c/w chronic venous insufficiency. No erythema or warmth.  Psychiatric: She has a normal mood and affect.  Nursing note and vitals reviewed.   ED Course  Procedures (including critical care time) Labs Review Labs Reviewed  URINALYSIS, ROUTINE W REFLEX MICROSCOPIC (NOT AT [facility]) - Abnormal; Notable for the following:    APPearance CLOUDY (*)    Leukocytes, UA SMALL (*)    All other components within normal limits  CBC WITH DIFFERENTIAL/PLATELET - Abnormal; Notable for the following:    WBC 2.8 (*)    MCV 101.7 (*)    MCH 34.3 (*)    Platelets 99 (*)    Neutro Abs 1.3 (*)    All other components within normal limits  COMPREHENSIVE METABOLIC PANEL - Abnormal; Notable for the following:    Calcium 8.6 (*)    Albumin 3.3 (*)    All other components within normal limits  URINE MICROSCOPIC-ADD ON - Abnormal; Notable for the following:    Squamous Epithelial / LPF MANY (*)    All other components within normal limits  BRAIN NATRIURETIC PEPTIDE - Abnormal; Notable for the following:    B Natriuretic Peptide 113.5 (*)    All other components within normal limits  I-STAT TROPOININ, ED    Imaging Review Dg Chest 2 View  03/25/2015   CLINICAL DATA:  Fluid retention for 2 days with heart fluttering and wheezing  EXAM: CHEST  2 VIEW  COMPARISON:  July 24, 2014  FINDINGS: The heart size and mediastinal contours are within normal limits. There is patchy consolidation of the right lung base. There is no pulmonary edema or pleural effusion. The visualized skeletal structures are stable.  IMPRESSION: No evidence of pulmonary edema.  Patchy consolidation of medial right lung base suspicious for developing pneumonia.   Electronically Signed   By: Abelardo Diesel M.D.   On: 03/25/2015 22:08   I have personally reviewed and  evaluated these images and lab results as part of my medical decision-making.   EKG Interpretation   Date/Time:  Thursday March 25 2015 22:02:37 EDT Ventricular Rate:  68 PR Interval:  136 QRS Duration: 106 QT Interval:  435 QTC Calculation: 463 R Axis:   67 Text Interpretation:  Sinus rhythm no acute ischemia No significant change  since last tracing Confirmed by Gerald Leitz ([id number]) on 03/25/2015  10:43:44 PM      MDM   Final diagnoses:  Bilateral edema of lower extremity  Pain of lower extremity, unspecified laterality  Bilateral hand swelling  Bilateral hand pain  Chronic back pain  Nausea  Tobacco use  History of HIV infection  Community acquired pneumonia    64 y.o. female here with b/l LE swelling x3 days, worsens with standing, improves with elevation. Chronic brawny appearance of BLE with 1+ pitting edema. Likely chronic venous stasis, pt has been seen for this here in the ER and at her PCP's in the past. Has previously been on lasix for this but was stopped on this medication. Pt also states some nausea, no abdominal tenderness, CMP WNL, U/A without evidence of infection, CBC unremarkable aside from chronically low plt. Distal pulses intact, no tachycardia or hypoxia, no CP/SOB complaint, doubt DVT. Pt does have a wheeze on exam, will get EKG, CXR, and BNP. Some mild R sided paraspinous lumbar tenderness, gait steady, no red flag s/sx, ongoing x31month, doubt need for further work up. Will give pain meds, zofran, and nebs. Will reassess shortly.   11:21 PM BNP mildly elevated at 113.5, trop neg (not crossing over, verified in mini-lab), EKG unchanged, CXR without pulm edema but with a small area of patchy consolidation at R lung base suspicious for developing PNA, given HIV status will treat with bactrim to cover to PCP pneumonia. Lung sounds improved after nebs, will send home with inhalers, discussed mucinex. Nausea and pain improved. Will send home with mild  diuretic. Discussed compression stockings. F/up with PCP in 3-5 days. Smoking cessation advised. I explained the diagnosis and have given explicit precautions to return to the ER including for any other new or worsening symptoms. The patient understands and accepts the medical plan as it's been dictated and I have answered their questions. Discharge instructions concerning home care and prescriptions have been given. The patient is STABLE and is discharged to home in good condition.  BP 116/63 mmHg  Pulse 75  Temp(Src) 98.1 F (36.7 C) (Oral)  Resp 18  Ht 5\' 3"  (1.6 m)  Wt 205 lb (92.987 kg)  BMI 36.32 kg/m2  SpO2 92%  Meds ordered this encounter  Medications  . ondansetron (ZOFRAN-ODT) disintegrating tablet 8 mg    Sig:   . HYDROcodone-acetaminophen (NORCO/VICODIN) 5-325 MG per tablet 1 tablet    Sig:   . albuterol (PROVENTIL) (2.5 MG/3ML) 0.083% nebulizer solution 5 mg    Sig:   . ipratropium (ATROVENT) nebulizer solution 0.5 mg    Sig:   . sulfamethoxazole-trimethoprim (BACTRIM DS,SEPTRA DS) 800-160 MG tablet    Sig: Take 2 tablets by mouth 3 (three) times daily. x21 days    Dispense:  126 tablet    Refill:  0    Order Specific Question:  Supervising Provider    Answer:  MILLER, BRIAN [[id number]]  . furosemide (LASIX) 20 MG tablet    Sig: Take 1 tablet (20 mg total) by mouth daily. X 3 days    Dispense:  3 tablet    Refill:  0    Order Specific Question:  Supervising Provider    Answer:  MILLER, BRIAN [[id number]]  . ondansetron (ZOFRAN) 8 MG tablet    Sig: Take 1 tablet (8 mg total) by mouth every 8 (eight) hours as needed for nausea or vomiting.    Dispense:  10 tablet    Refill:  0    Order Specific Question:  Supervising Provider    Answer:  Sabra Heck, BRIAN [[id number]]  . HYDROcodone-acetaminophen (NORCO) 5-325 MG tablet    Sig: Take 1 tablet by mouth every 6 (six) hours as needed for severe pain.    Dispense:  6 tablet    Refill:  0    Order Specific Question:  Supervising Provider      Answer:  MILLER, BRIAN [[id number]]  . albuterol (PROVENTIL HFA;VENTOLIN HFA) 108 (90 BASE) MCG/ACT inhaler    Sig: Inhale 2 puffs into the lungs every 2 (two) hours as needed for wheezing or shortness of breath (cough).    Dispense:  1 Inhaler    Refill:  0    Order Specific Question:  Supervising Provider    Answer:  Noemi Chapel [[id number]]       Saundra Gin Camprubi-Soms, PA-C 03/25/15 2341  Courteney Julio Alm, MD 03/26/15 1658

## 2015-03-25 NOTE — ED Notes (Signed)
Pt. reports hands and lower leg/feet swelling onset this week , denies injury , pt. added right low back pain for several weeks , denies dysuria or hematuria.

## 2015-03-26 LAB — I-STAT TROPONIN, ED: Troponin i, poc: 0 ng/mL (ref 0.00–0.08)

## 2015-05-31 ENCOUNTER — Ambulatory Visit: Payer: Medicaid Other | Admitting: Family Medicine

## 2015-06-07 ENCOUNTER — Ambulatory Visit: Payer: Medicaid Other | Admitting: Family Medicine

## 2015-08-30 ENCOUNTER — Encounter: Payer: Self-pay | Admitting: Family Medicine

## 2015-08-30 ENCOUNTER — Ambulatory Visit (INDEPENDENT_AMBULATORY_CARE_PROVIDER_SITE_OTHER): Payer: Medicaid Other | Admitting: Family Medicine

## 2015-08-30 ENCOUNTER — Telehealth: Payer: Self-pay

## 2015-08-30 ENCOUNTER — Other Ambulatory Visit: Payer: Self-pay

## 2015-08-30 VITALS — BP 123/96 | HR 82 | Temp 97.9°F | Ht 64.0 in | Wt 211.0 lb

## 2015-08-30 DIAGNOSIS — G609 Hereditary and idiopathic neuropathy, unspecified: Secondary | ICD-10-CM

## 2015-08-30 DIAGNOSIS — R600 Localized edema: Secondary | ICD-10-CM

## 2015-08-30 DIAGNOSIS — Z23 Encounter for immunization: Secondary | ICD-10-CM | POA: Diagnosis not present

## 2015-08-30 LAB — TSH: TSH: 2.49 m[IU]/L

## 2015-08-30 MED ORDER — FUROSEMIDE 20 MG PO TABS: 20.0000 mg | ORAL_TABLET | Freq: Every day | ORAL | Status: DC

## 2015-08-30 MED ORDER — ALBUTEROL SULFATE HFA 108 (90 BASE) MCG/ACT IN AERS: 2.0000 | INHALATION_SPRAY | RESPIRATORY_TRACT | Status: DC | PRN

## 2015-08-30 MED ORDER — PREGABALIN 50 MG PO CAPS: 50.0000 mg | ORAL_CAPSULE | Freq: Three times a day (TID) | ORAL | Status: DC

## 2015-08-30 MED FILL — FUROSEMIDE 20 MG TABLET: 20 | 30 days supply | Qty: 30 | Fill #0

## 2015-08-30 MED FILL — PROVENTIL HFA 90 MCG INH: 108 (90 BAS | 30 days supply | Qty: 7 | Fill #0

## 2015-08-30 NOTE — Progress Notes (Signed)
Patient ID: Ruth Stephenson, female   DOB: [birth year], 65 y.o.   MRN: [id number]   Ruth Stephenson, is a 65 y.o. female  [id number]  [id number]  DOB - Dec 19, 1950  CC:  Chief Complaint  Patient presents with  . new patient/get established    HIV positive, sees infectious disease, complaints of leg and foot pain x6 months and he told her to get PCP to discuss these issues       HPI: Ruth Stephenson is a 65 y.o. female here to establish care. She has a history of HIV and Hepatitis C and is followed by [facility]. She is having a problem with lower leg and foot pain and was given a diagnosis of peripheral neuropathy and instructed to follow-up here to discuss treatment. She has been tried on gabapentin but reports did not help her pain very much. She is also on amitriptyline. Other than her foot and leg pain, she denies other complaints. She does have a history of treated colon cancer and is followed by oncology.  Health Maintenance:  She is in need of mammogram and PAP smear. She reports smoking 2 cigarettes a day but is not ready to give those two up. We are giving influenza and prevnar today. We need to schedule for mammogram and have return for PAP and Tdap.  No Known Allergies Past Medical History  Diagnosis Date  . HIV (human immunodeficiency virus infection) ([place])   . Hepatitis C   . Cancer [facility])    Current Outpatient Prescriptions on File Prior to Visit  Medication Sig Dispense Refill  . amitriptyline (ELAVIL) 50 MG tablet Take 1 tablet (50 mg total) by mouth at bedtime. 30 tablet 11  . elvitegravir-cobicistat-emtricitabine-tenofovir (GENVOYA) 150-150-200-10 MG TABS tablet Take 1 tablet by mouth daily with breakfast. 30 tablet 11  . HYDROcodone-acetaminophen (NORCO) 5-325 MG per tablet Take 1-2 tablets by mouth every 6 (six) hours as needed. (Patient not taking: Reported on 03/25/2015) 20 tablet 0  . HYDROcodone-acetaminophen (NORCO) 5-325 MG tablet Take 1 tablet by mouth every 6 (six)  hours as needed for severe pain. (Patient not taking: Reported on 08/30/2015) 6 tablet 0  . ondansetron (ZOFRAN) 8 MG tablet Take 1 tablet (8 mg total) by mouth every 8 (eight) hours as needed for nausea or vomiting. (Patient not taking: Reported on 08/30/2015) 10 tablet 0  . oxyCODONE (ROXICODONE) 5 MG immediate release tablet Take 1 tablet (5 mg total) by mouth every 4 (four) hours as needed for severe pain. (Patient not taking: Reported on 03/25/2015) 10 tablet 0  . sulfamethoxazole-trimethoprim (BACTRIM DS,SEPTRA DS) 800-160 MG tablet Take 2 tablets by mouth 3 (three) times daily. x21 days (Patient not taking: Reported on 08/30/2015) 126 tablet 0   No current facility-administered medications on file prior to visit.   Family History  Problem Relation Age of Onset  . Cancer Mother    Social History   Social History  . Marital Status: Single    Spouse Name: N/A  . Number of Children: N/A  . Years of Education: N/A   Occupational History  . Not on file.   Social History Main Topics  . Smoking status: Current Every Day Smoker -- 0.30 packs/day    Types: Cigarettes  . Smokeless tobacco: Not on file     Comment: trying to cut back  . Alcohol Use: No  . Drug Use: No  . Sexual Activity: Not on file     Comment: declined condoms   Other Topics Concern  .  Not on file   Social History Narrative    Review of Systems: Constitutional: Negative for fever, chills, appetite change, weight loss,  Fatigue. Skin: Negative for rashes or lesions of concern. HENT: Negative for ear pain, ear discharge.nose bleeds Eyes: Negative for pain, discharge, redness, itching and visual disturbance. Neck: Negative for pain, stiffness Respiratory: Negative for cough, shortness of breath,   Cardiovascular: Negative for chest pain, palpitations and leg swelling. Gastrointestinal: Negative for abdominal pain, nausea, vomiting, diarrhea, constipations Genitourinary: Negative for dysuria, urgency, frequency,  hematuria,  Musculoskeletal: Negative for back pain, joint pain, joint  swelling, and gait problem.Negative for weakness.Positive for lower leg and foot pain Neurological: Negative for dizziness, tremors, seizures, syncope,   light-headedness, numbness and headaches.  Hematological: Negative for easy bruising or bleeding Psychiatric/Behavioral: Negative for depression, anxiety, decreased concentration, confusion   Objective:   Filed Vitals:   08/30/15 1101  BP: 123/96  Pulse: 82  Temp: 97.9 F (36.6 C)    Physical Exam: Constitutional: Patient appears well-developed and well-nourished. No distress. HENT: Normocephalic, atraumatic, External right and left ear normal. Oropharynx is clear and moist.  Eyes: Conjunctivae and EOM are normal. PERRLA, no scleral icterus. Neck: Normal ROM. Neck supple. No lymphadenopathy, No thyromegaly. CVS: RRR, S1/S2 +, no murmurs, no gallops, no rubs Pulmonary: Effort and breath sounds normal, no stridor, rhonchi, wheezes, rales.  Abdominal: Soft. Normoactive BS,, no distension, tenderness, rebound or guarding.  Musculoskeletal: Normal range of motion. No edema and no tenderness. No abnormalities of feet and legs noted. Neuro: Alert.Normal muscle tone coordination. Non-focal Skin: Skin is warm and dry. No rash noted. Not diaphoretic. No erythema. No pallor. Psychiatric: Normal mood and affect. Behavior, judgment, thought content normal.  Lab Results  Component Value Date   WBC 3.0* 08/30/2015   HGB 15.2* 08/30/2015   HCT 45.5 08/30/2015   MCV 98.9 08/30/2015   PLT 95* 08/30/2015   Lab Results  Component Value Date   CREATININE 0.62 08/30/2015   BUN 6* 08/30/2015   NA 143 08/30/2015   K 4.4 08/30/2015   CL 105 08/30/2015   CO2 28 08/30/2015    No results found for: HGBA1C Lipid Panel     Component Value Date/Time   CHOL 163 08/30/2015 1134   TRIG 60 08/30/2015 1134   HDL 75 08/30/2015 1134   CHOLHDL 2.2 08/30/2015 1134   VLDL 12  08/30/2015 1134   LDLCALC 76 08/30/2015 1134       Assessment and plan:   1. Pedal edema  - COMPLETE METABOLIC PANEL WITH GFR - CBC with Differential - TSH - Lipid panel - furosemide (LASIX) 20 MG tablet; Take 1 tablet (20 mg total) by mouth daily.  Dispense: 30 tablet; Refill: 1  2. 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  - pregabalin (LYRICA) 50 MG capsule; Take 1 capsule (50 mg total) by mouth 3 (three) times daily.  Dispense: 90 capsule; Refill: 1  3. Need for prophylactic vaccination and inoculation against influenza  - Flu Vaccine QUAD 36+ mos PF IM (Fluarix & Fluzone Quad PF)   Return in about 6 months (around 03/01/2016).  The patient was given clear instructions to go to ER or return to medical center if symptoms don't improve, worsen or new problems develop. The patient verbalized understanding.    Micheline Chapman FNP  09/06/2015, 8:09 AM

## 2015-08-30 NOTE — Telephone Encounter (Signed)
Patient called requesting refill on her albuterol inhaler, Sharon Seller NP will refill.

## 2015-08-31 LAB — COMPLETE METABOLIC PANEL WITH GFR
ALBUMIN: 3.9 g/dL (ref 3.6–5.1)
ALK PHOS: 92 U/L (ref 33–130)
ALT: 15 U/L (ref 6–29)
AST: 24 U/L (ref 10–35)
BUN: 6 mg/dL — AB (ref 7–25)
CALCIUM: 9.4 mg/dL (ref 8.6–10.4)
CHLORIDE: 105 mmol/L (ref 98–110)
CO2: 28 mmol/L (ref 20–31)
Creat: 0.62 mg/dL (ref 0.50–0.99)
GFR, Est African American: >89 mL/min (ref >=60)
Glucose, Bld: 106 mg/dL — ABNORMAL HIGH (ref 65–99)
POTASSIUM: 4.4 mmol/L (ref 3.5–5.3)
Sodium: 143 mmol/L (ref 135–146)
Total Bilirubin: 0.5 mg/dL (ref 0.2–1.2)
Total Protein: 7.5 g/dL (ref 6.1–8.1)

## 2015-08-31 LAB — CBC WITH DIFFERENTIAL/PLATELET
BASOS PCT: 0 % (ref 0–1)
Basophils Absolute: 0 10*3/uL (ref 0.0–0.1)
EOS ABS: 0 10*3/uL (ref 0.0–0.7)
Eosinophils Relative: 1 % (ref 0–5)
HCT: 45.5 % (ref 36.0–46.0)
HEMOGLOBIN: 15.2 g/dL — AB (ref 12.0–15.0)
LYMPHS PCT: 35 % (ref 12–46)
Lymphs Abs: 1.1 10*3/uL (ref 0.7–4.0)
MCH: 33 pg (ref 26.0–34.0)
MCHC: 33.4 g/dL (ref 30.0–36.0)
MCV: 98.9 fL (ref 78.0–100.0)
MONO ABS: 0.2 10*3/uL (ref 0.1–1.0)
MPV: 10.6 fL (ref 8.6–12.4)
Monocytes Relative: 8 % (ref 3–12)
NEUTROS ABS: 1.7 10*3/uL (ref 1.7–7.7)
NEUTROS PCT: 56 % (ref 43–77)
PLATELETS: 95 10*3/uL — AB (ref 150–400)
RBC: 4.6 MIL/uL (ref 3.87–5.11)
RDW: 13.8 % (ref 11.5–15.5)
WBC: 3 10*3/uL — AB (ref 4.0–10.5)

## 2015-08-31 LAB — LIPID PANEL
CHOL/HDL RATIO: 2.2 ratio (ref <=5.0)
Cholesterol: 163 mg/dL (ref 125–200)
HDL: 75 mg/dL (ref >=46)
LDL Cholesterol: 76 mg/dL (ref <130)
Triglycerides: 60 mg/dL (ref <150)
VLDL: 12 mg/dL (ref <30)

## 2015-09-06 ENCOUNTER — Ambulatory Visit: Payer: Medicaid Other

## 2015-09-07 ENCOUNTER — Ambulatory Visit (INDEPENDENT_AMBULATORY_CARE_PROVIDER_SITE_OTHER): Payer: Medicaid Other | Admitting: *Deleted

## 2015-09-07 VITALS — Wt 215.0 lb

## 2015-09-07 DIAGNOSIS — R600 Localized edema: Secondary | ICD-10-CM | POA: Diagnosis not present

## 2015-09-07 NOTE — Progress Notes (Signed)
Patient is here for Weight Check  Patient states after taking the pills for 2 days she weighed herself at home. Patient states weight was 193 from 211.

## 2015-09-09 ENCOUNTER — Telehealth: Payer: Self-pay

## 2015-09-09 MED FILL — LYRICA 50 MG CAPSULE: 50 | 30 days supply | Qty: 90 | Fill #0

## 2015-09-09 NOTE — Telephone Encounter (Signed)
Called [facility], got lyrica 50mg  tid approved [id number].  Pharmacy informed.

## 2015-09-14 MED FILL — GENVOYA TABLET: 150-150-200 | 30 days supply | Qty: 30 | Fill #5

## 2015-09-17 ENCOUNTER — Ambulatory Visit: Payer: Medicaid Other | Admitting: Family Medicine

## 2015-10-08 MED FILL — GENVOYA TABLET: 150-150-200 | 30 days supply | Qty: 30 | Fill #6

## 2015-11-08 MED FILL — GENVOYA TABLET: 150-150-200 | 30 days supply | Qty: 30 | Fill #7

## 2015-12-07 ENCOUNTER — Other Ambulatory Visit: Payer: Self-pay | Admitting: Internal Medicine

## 2015-12-07 DIAGNOSIS — B20 Human immunodeficiency virus [HIV] disease: Secondary | ICD-10-CM

## 2015-12-07 MED FILL — GENVOYA TABLET: 150-150-200 | 30 days supply | Qty: 30 | Fill #0

## 2015-12-07 NOTE — Telephone Encounter (Signed)
Called patient and made appointment for follow-up and labs. Rodman Key, LPN

## 2015-12-30 ENCOUNTER — Other Ambulatory Visit: Payer: Medicaid Other

## 2015-12-31 ENCOUNTER — Other Ambulatory Visit: Payer: Medicaid Other

## 2016-01-04 ENCOUNTER — Other Ambulatory Visit: Payer: Medicaid Other

## 2016-01-05 MED FILL — GENVOYA TABLET: 150-150-200 | 30 days supply | Qty: 30 | Fill #1

## 2016-01-07 ENCOUNTER — Encounter: Payer: Self-pay | Admitting: *Deleted

## 2016-01-11 ENCOUNTER — Encounter: Payer: Self-pay | Admitting: Internal Medicine

## 2016-01-11 ENCOUNTER — Ambulatory Visit (INDEPENDENT_AMBULATORY_CARE_PROVIDER_SITE_OTHER): Payer: Medicaid Other | Admitting: Internal Medicine

## 2016-01-11 DIAGNOSIS — Z21 Asymptomatic human immunodeficiency virus [HIV] infection status: Secondary | ICD-10-CM | POA: Diagnosis present

## 2016-01-11 DIAGNOSIS — B20 Human immunodeficiency virus [HIV] disease: Secondary | ICD-10-CM

## 2016-01-11 NOTE — Assessment & Plan Note (Signed)
I spoke with her today about how to take her medication and also had her speak with Milus Glazier, our infectious disease pharmacist, who will also help her get her medications from her pharmacy. I will check lab work today and have her follow-up in 6 months. He'll also help her get back into primary care.

## 2016-01-11 NOTE — Progress Notes (Signed)
HPI: Ruth Stephenson is a 65 y.o. female who is here today to follow up with Dr. Megan Salon for her HIV.  She has been taking Genvoya for quite some time and her virus is undetectable.  She has been taking Genvoya before bed who no food so I was asked to see her and counsel her on how to take the medications.  Allergies: No Known Allergies  Vitals: Temp: 97.7 F (36.5 C) (07/18 1520) Temp Source: Oral (07/18 1520) BP: 155/90 mmHg (07/18 1520) Pulse Rate: 54 (07/18 1520)  Past Medical History: Past Medical History  Diagnosis Date  . HIV (human immunodeficiency virus infection) ([place])   . Hepatitis C   . Cancer [facility])     Social History: Social History   Social History  . Marital Status: Single    Spouse Name: N/A  . Number of Children: N/A  . Years of Education: N/A   Social History Main Topics  . Smoking status: Current Every Day Smoker -- 0.30 packs/day    Types: Cigarettes  . Smokeless tobacco: None     Comment: trying to cut back  . Alcohol Use: No  . Drug Use: No  . Sexual Activity: Not Asked     Comment: declined condoms   Other Topics Concern  . None   Social History Narrative    Current Regimen: Genvoya  Labs: HIV 1 RNA QUANT (copies/mL)  Date Value  11/30/2014 <20  09/15/2014 46*  04/02/2014 <20   CD4 T CELL ABS (/uL)  Date Value  11/30/2014 230*  09/15/2014 230*  04/02/2014 220*   HEP B S AB (no units)  Date Value  07/14/2013 NEG   HEPATITIS B SURFACE AG (no units)  Date Value  07/14/2013 NEGATIVE    CrCl: CrCl cannot be calculated (Patient has no serum creatinine result on file.).  Lipids:    Component Value Date/Time   CHOL 163 08/30/2015 1134   TRIG 60 08/30/2015 1134   HDL 75 08/30/2015 1134   CHOLHDL 2.2 08/30/2015 1134   VLDL 12 08/30/2015 1134   LDLCALC 76 08/30/2015 1134    Assessment: Ruth Stephenson has been taking her Genvoya incorrectly.  She has been taking it before bed without food.  I asked her what her most consistent  meal of the day was and she said it was breakfast.  She tells me that she eats breakfast every day and does not miss a meal.  I instructed her that taking Genvoya with a meal increases drug levels and to start taking it with breakfast.  I also gave her a pill box keychain. She says that will not be a problem, and she will start taking it with breakfast.  Plans: - Start taking Genvoya with breakfast  Cassie L. Donnajean Lopes, PharmD Infectious [place] [facility] 01/11/2016, 3:52 PM

## 2016-01-11 NOTE — Progress Notes (Signed)
Patient Active Problem List   Diagnosis Date Noted  . HIV (human immunodeficiency virus infection) ([place]) 09/19/2012    Priority: High  . Cancer of right colon ([place]) 10/24/2012    Priority: Medium  . Hepatitis C 09/19/2012    Priority: Medium  . Bilateral edema of lower extremity 07/29/2014  . History of colon cancer, stage II 07/29/2014  . Neuropathy ([place]) 05/27/2014  . GERD (gastroesophageal reflux disease) 05/27/2014  . History of pancreatitis 09/19/2012  . Tobacco abuse 09/19/2012  . Anemia 09/19/2012    Patient's Medications  New Prescriptions   No medications on file  Previous Medications   ALBUTEROL (PROVENTIL HFA;VENTOLIN HFA) 108 (90 BASE) MCG/ACT INHALER    Inhale 2 puffs into the lungs every 2 (two) hours as needed for wheezing or shortness of breath (cough).   AMITRIPTYLINE (ELAVIL) 50 MG TABLET    Take 1 tablet (50 mg total) by mouth at bedtime.   FUROSEMIDE (LASIX) 20 MG TABLET    Take 1 tablet (20 mg total) by mouth daily.   GENVOYA 150-150-200-10 MG TABS TABLET    TAKE 1 TABLET BY MOUTH DAILY WITH BREAKFAST.   HYDROCODONE-ACETAMINOPHEN (NORCO) 5-325 MG PER TABLET    Take 1-2 tablets by mouth every 6 (six) hours as needed.   HYDROCODONE-ACETAMINOPHEN (NORCO) 5-325 MG TABLET    Take 1 tablet by mouth every 6 (six) hours as needed for severe pain.   ONDANSETRON (ZOFRAN) 8 MG TABLET    Take 1 tablet (8 mg total) by mouth every 8 (eight) hours as needed for nausea or vomiting.   OXYCODONE (ROXICODONE) 5 MG IMMEDIATE RELEASE TABLET    Take 1 tablet (5 mg total) by mouth every 4 (four) hours as needed for severe pain.   PREGABALIN (LYRICA) 50 MG CAPSULE    Take 1 capsule (50 mg total) by mouth 3 (three) times daily.   SULFAMETHOXAZOLE-TRIMETHOPRIM (BACTRIM DS,SEPTRA DS) 800-160 MG TABLET    Take 2 tablets by mouth 3 (three) times daily. x21 days  Modified Medications   No medications on file  Discontinued Medications   No medications on file     Subjective: Ruth Stephenson is in for her first visit in one year. She continues to take Genvoya but generally takes it on an empty stomach. She's also been having problems with her pharmacy that caused her to frequently missed doses. She has not had seen her primary care physician recently. She is worried because she has gained weight and feels like she is retaining fluid. She is having pain in her right knee. She states that no over-the-counter medications help and that she could not tolerate Elavil or Lyrica in the past.   Review of Systems: Review of Systems  Constitutional: Negative for fever, chills, weight loss, malaise/fatigue and diaphoresis.  HENT: Negative for sore throat.   Respiratory: Negative for cough, sputum production and shortness of breath.   Cardiovascular: Negative for chest pain.  Gastrointestinal: Negative for nausea, vomiting, abdominal pain and diarrhea.  Genitourinary: Negative for dysuria.  Musculoskeletal: Positive for joint pain. Negative for myalgias.  Skin: Negative for rash.  Neurological: Negative for dizziness and headaches.  Psychiatric/Behavioral: Negative for depression and substance abuse. The patient is not nervous/anxious.     Past Medical History  Diagnosis Date  . HIV (human immunodeficiency virus infection) ([place])   . Hepatitis C   . Cancer [facility])     Social History  Substance Use Topics  . Smoking  status: Current Every Day Smoker -- 0.30 packs/day    Types: Cigarettes  . Smokeless tobacco: None     Comment: trying to cut back  . Alcohol Use: No    Family History  Problem Relation Age of Onset  . Cancer Mother     No Known Allergies  Objective:  Filed Vitals:   01/11/16 1520  BP: 155/90  Pulse: 54  Temp: 97.7 F (36.5 C)  TempSrc: Oral  Weight: 201 lb 8 oz (91.4 kg)   Body mass index is 34.57 kg/(m^2).  Physical Exam  Constitutional: She is oriented to person, place, and time.  She appears worried his usual but is in no  distress.  HENT:  Mouth/Throat: No oropharyngeal exudate.  Eyes: Conjunctivae are normal.  Cardiovascular: Normal rate and regular rhythm.   No murmur heard. Pulmonary/Chest: Breath sounds normal.  Abdominal: Soft. She exhibits no mass. There is no tenderness.  Musculoskeletal: Normal range of motion. She exhibits no edema or tenderness.  Neurological: She is alert and oriented to person, place, and time.  Skin: No rash noted.  Psychiatric: Mood and affect normal.    Lab Results Lab Results  Component Value Date   WBC 3.0* 08/30/2015   HGB 15.2* 08/30/2015   HCT 45.5 08/30/2015   MCV 98.9 08/30/2015   PLT 95* 08/30/2015    Lab Results  Component Value Date   CREATININE 0.62 08/30/2015   BUN 6* 08/30/2015   NA 143 08/30/2015   K 4.4 08/30/2015   CL 105 08/30/2015   CO2 28 08/30/2015    Lab Results  Component Value Date   ALT 15 08/30/2015   AST 24 08/30/2015   ALKPHOS 92 08/30/2015   BILITOT 0.5 08/30/2015    Lab Results  Component Value Date   CHOL 163 08/30/2015   HDL 75 08/30/2015   LDLCALC 76 08/30/2015   TRIG 60 08/30/2015   CHOLHDL 2.2 08/30/2015   HIV 1 RNA QUANT (copies/mL)  Date Value  11/30/2014 <20  09/15/2014 46*  04/02/2014 <20   CD4 T CELL ABS (/uL)  Date Value  11/30/2014 230*  09/15/2014 230*  04/02/2014 220*     Problem List Items Addressed This Visit      High   HIV (human immunodeficiency virus infection) ([place])    I spoke with her today about how to take her medication and also had her speak with Milus Glazier, our infectious disease pharmacist, who will also help her get her medications from her pharmacy. I will check lab work today and have her follow-up in 6 months. He'll also help her get back into primary care.      Relevant Orders   T-helper cell (CD4)- ([facility] clinic only)   HIV 1 RNA quant-no reflex-bld   CBC   Comprehensive metabolic panel   RPR   Lipid panel        Michel Bickers, MD [facility] [place] for  [place] [phone] pager   [phone] cell 01/11/2016, 5:18 PM

## 2016-01-12 LAB — LIPID PANEL
Cholesterol: 157 mg/dL (ref 125–200)
HDL: 81 mg/dL (ref >=46)
LDL CALC: 66 mg/dL (ref <130)
TRIGLYCERIDES: 50 mg/dL (ref <150)
Total CHOL/HDL Ratio: 1.9 Ratio (ref <=5.0)
VLDL: 10 mg/dL (ref <30)

## 2016-01-12 LAB — COMPREHENSIVE METABOLIC PANEL
ALK PHOS: 82 U/L (ref 33–130)
ALT: 14 U/L (ref 6–29)
AST: 22 U/L (ref 10–35)
Albumin: 4.3 g/dL (ref 3.6–5.1)
BUN: 10 mg/dL (ref 7–25)
CALCIUM: 9.7 mg/dL (ref 8.6–10.4)
CO2: 33 mmol/L — AB (ref 20–31)
Chloride: 104 mmol/L (ref 98–110)
Creat: 0.65 mg/dL (ref 0.50–0.99)
GLUCOSE: 80 mg/dL (ref 65–99)
POTASSIUM: 4.4 mmol/L (ref 3.5–5.3)
SODIUM: 142 mmol/L (ref 135–146)
Total Bilirubin: 0.7 mg/dL (ref 0.2–1.2)
Total Protein: 8.2 g/dL — ABNORMAL HIGH (ref 6.1–8.1)

## 2016-01-12 LAB — RPR

## 2016-01-12 LAB — CBC
HEMATOCRIT: 47.4 % — AB (ref 35.0–45.0)
HEMOGLOBIN: 15.9 g/dL — AB (ref 11.7–15.5)
MCH: 33.8 pg — ABNORMAL HIGH (ref 27.0–33.0)
MCHC: 33.5 g/dL (ref 32.0–36.0)
MCV: 100.6 fL — ABNORMAL HIGH (ref 80.0–100.0)
MPV: 11.1 fL (ref 7.5–12.5)
Platelets: 105 10*3/uL — ABNORMAL LOW (ref 140–400)
RBC: 4.71 MIL/uL (ref 3.80–5.10)
RDW: 13.6 % (ref 11.0–15.0)
WBC: 3.1 10*3/uL — AB (ref 3.8–10.8)

## 2016-01-13 LAB — T-HELPER CELL (CD4) - (RCID CLINIC ONLY)
CD4 % Helper T Cell: 24 % — ABNORMAL LOW (ref 33–55)
CD4 T Cell Abs: 290 /uL — ABNORMAL LOW (ref 400–2700)

## 2016-01-13 LAB — HIV-1 RNA QUANT-NO REFLEX-BLD
HIV 1 RNA Quant: <20 copies/mL (ref <20)
HIV-1 RNA Quant, Log: <1.3 Log copies/mL (ref <1.30)

## 2016-01-25 ENCOUNTER — Telehealth: Payer: Self-pay | Admitting: *Deleted

## 2016-01-25 NOTE — Telephone Encounter (Signed)
Returning patient's call, unable to reach her, left message.

## 2016-01-26 ENCOUNTER — Other Ambulatory Visit: Payer: Self-pay | Admitting: *Deleted

## 2016-01-26 DIAGNOSIS — B20 Human immunodeficiency virus [HIV] disease: Secondary | ICD-10-CM

## 2016-01-26 MED ORDER — ELVITEG-COBIC-EMTRICIT-TENOFAF 150-150-200-10 MG PO TABS: 1.0000 | ORAL_TABLET | Freq: Every day | ORAL | 11 refills | Status: DC

## 2016-01-26 NOTE — Telephone Encounter (Signed)
Pt very happy about her numbers improving.  Will continue taking her Genvoya.

## 2016-02-03 MED FILL — GENVOYA TABLET: 150-150-200 | 30 days supply | Qty: 30 | Fill #2

## 2016-03-03 MED FILL — GENVOYA TABLET: 150-150-200 | 30 days supply | Qty: 30 | Fill #0

## 2016-03-23 ENCOUNTER — Telehealth: Payer: Self-pay | Admitting: *Deleted

## 2016-03-23 NOTE — Telephone Encounter (Signed)
Increasing bilateral lower extremity pain, especially at night.  Waking her up at night.  Requesting appointment for this specific problem.  Appointment scheduled for 04/17/16 @ 0845.

## 2016-03-29 ENCOUNTER — Emergency Department (HOSPITAL_COMMUNITY): Payer: Medicaid Other

## 2016-03-29 ENCOUNTER — Encounter (HOSPITAL_COMMUNITY): Payer: Self-pay

## 2016-03-29 ENCOUNTER — Emergency Department (HOSPITAL_COMMUNITY)
Admission: EM | Admit: 2016-03-29 | Discharge: 2016-03-29 | Disposition: A | Discharge status: Home / Self Care | Payer: Medicaid Other | Attending: Emergency Medicine | Admitting: Emergency Medicine

## 2016-03-29 DIAGNOSIS — Z21 Asymptomatic human immunodeficiency virus [HIV] infection status: Secondary | ICD-10-CM | POA: Diagnosis not present

## 2016-03-29 DIAGNOSIS — F17210 Nicotine dependence, cigarettes, uncomplicated: Secondary | ICD-10-CM | POA: Diagnosis not present

## 2016-03-29 DIAGNOSIS — R0789 Other chest pain: Secondary | ICD-10-CM

## 2016-03-29 DIAGNOSIS — Z85038 Personal history of other malignant neoplasm of large intestine: Secondary | ICD-10-CM | POA: Diagnosis not present

## 2016-03-29 DIAGNOSIS — M25511 Pain in right shoulder: Secondary | ICD-10-CM

## 2016-03-29 LAB — CBC
HEMATOCRIT: 48.1 % — AB (ref 36.0–46.0)
Hemoglobin: 15.6 g/dL — ABNORMAL HIGH (ref 12.0–15.0)
MCH: 33.9 pg (ref 26.0–34.0)
MCHC: 32.4 g/dL (ref 30.0–36.0)
MCV: 104.6 fL — AB (ref 78.0–100.0)
Platelets: 104 10*3/uL — ABNORMAL LOW (ref 150–400)
RBC: 4.6 MIL/uL (ref 3.87–5.11)
RDW: 12.8 % (ref 11.5–15.5)
WBC: 3.7 10*3/uL — ABNORMAL LOW (ref 4.0–10.5)

## 2016-03-29 LAB — I-STAT TROPONIN, ED: Troponin i, poc: 0 ng/mL (ref 0.00–0.08)

## 2016-03-29 LAB — BASIC METABOLIC PANEL
Anion gap: 9 (ref 5–15)
BUN: 8 mg/dL (ref 6–20)
CHLORIDE: 103 mmol/L (ref 101–111)
CO2: 28 mmol/L (ref 22–32)
Calcium: 9.5 mg/dL (ref 8.9–10.3)
Creatinine, Ser: 0.7 mg/dL (ref 0.44–1.00)
GFR calc Af Amer: >60 mL/min (ref >60)
GFR calc non Af Amer: >60 mL/min (ref >60)
GLUCOSE: 158 mg/dL — AB (ref 65–99)
POTASSIUM: 4.9 mmol/L (ref 3.5–5.1)
Sodium: 140 mmol/L (ref 135–145)

## 2016-03-29 NOTE — ED Notes (Signed)
Pt left AMA. Gloriann Loan, PA and RN educated pt on leaving AMA.

## 2016-03-29 NOTE — ED Triage Notes (Signed)
Pt presents with 4 day h/o R arm/shoulder pain (no injury) and mid-sternal chest pain.  Pt reports shortness of breath, denies nausea.

## 2016-03-29 NOTE — ED Provider Notes (Signed)
[facility] DEPT Provider Note   CSN: [id number] Arrival date & time: 03/29/16  1447     History   Chief Complaint Chief Complaint  Patient presents with  . Chest Pain    HPI Ruth Stephenson is a 65 y.o. female.  HPI Ruth Stephenson is a 65 y.o. female with PMH significant for hepatitis C and HIV who presents with 4-5 days of atraumatic, gradual onset, constant right shoulder and upper arm pain that's worse with movement and palpation.  She denies any medications PTA, but then states that Tylenol and Ibuprofen "don't do shit for my pain".  She denies any numbness or weakness.  She also complains of chest tightness intermittently over these 4-5 days as well.  She notes it comes on when sporadically without specific trigger.  She denies any N/V, diaphoresis, syncope.  No fever, chills, cough, N/V/D, abdominal pain, or urinary symptoms.  No personal cardiac history.  No family history.  She does smoke. No hx of DVT/PE.  No recent prolong immobilization or surgery.  No exogenous estrogen use.   Past Medical History:  Diagnosis Date  . Cancer ([place])   . Hepatitis C   . HIV (human immunodeficiency virus infection) [facility])     Patient Active Problem List   Diagnosis Date Noted  . Bilateral edema of lower extremity 07/29/2014  . History of colon cancer, stage II 07/29/2014  . Neuropathy ([place]) 05/27/2014  . GERD (gastroesophageal reflux disease) 05/27/2014  . Cancer of right colon ([place]) 10/24/2012  . History of pancreatitis 09/19/2012  . HIV (human immunodeficiency virus infection) ([place]) 09/19/2012  . Hepatitis C 09/19/2012  . Tobacco abuse 09/19/2012  . Anemia 09/19/2012    Past Surgical History:  Procedure Laterality Date  . CARPAL TUNNEL RELEASE Left 01/19/2014   Procedure: LEFT CARPAL TUNNEL RELEASE;  Surgeon: Schuyler Amor, MD;  Location: [facility];  Service: Orthopedics;  Laterality: Left;  . COLON RESECTION N/A 09/23/2012   Procedure: LAPAROSCOPIC RIGHT  COLON RESECTION   ;  Surgeon: Edward Jolly, MD;  Location: [facility];  Service: General;  Laterality: N/A;  LAPOROSCOPY, RIGHT HEMICOLECTOMY  . COLON SURGERY  09/23/2012   lap right colectomy  . COLONOSCOPY N/A 09/20/2012   Procedure: COLONOSCOPY;  Surgeon: Beryle Beams, MD;  Location: [facility];  Service: Endoscopy;  Laterality: N/A;  . OPEN REDUCTION INTERNAL FIXATION (ORIF) DISTAL RADIAL FRACTURE Left 01/19/2014   Procedure: OPEN REDUCTION INTERNAL FIXATION (ORIF) LEFT  DISTAL RADIAL FRACTURE;  Surgeon: Schuyler Amor, MD;  Location: [facility];  Service: Orthopedics;  Laterality: Left;    OB History    No data available       Home Medications    Prior to Admission medications   Medication Sig Start Date End Date Taking? Authorizing Provider  elvitegravir-cobicistat-emtricitabine-tenofovir (GENVOYA) 150-150-200-10 MG TABS tablet Take 1 tablet by mouth daily with breakfast. 01/26/16  Yes Michel Bickers, MD  albuterol (PROVENTIL HFA;VENTOLIN HFA) 108 (90 Base) MCG/ACT inhaler Inhale 2 puffs into the lungs every 2 (two) hours as needed for wheezing or shortness of breath (cough). Patient not taking: Reported on 03/29/2016 08/30/15   Micheline Chapman, NP  amitriptyline (ELAVIL) 50 MG tablet Take 1 tablet (50 mg total) by mouth at bedtime. Patient not taking: Reported on 03/29/2016 06/10/14   Michel Bickers, MD  furosemide (LASIX) 20 MG tablet Take 1 tablet (20 mg total) by mouth daily. Patient not taking: Reported on 03/29/2016 08/30/15   Vaughan Basta  Reyes Ivan, NP  HYDROcodone-acetaminophen (NORCO) 5-325 MG per tablet Take 1-2 tablets by mouth every 6 (six) hours as needed. Patient not taking: Reported on 03/29/2016 08/26/14   Veryl Speak, MD  HYDROcodone-acetaminophen (NORCO) 5-325 MG tablet Take 1 tablet by mouth every 6 (six) hours as needed for severe pain. Patient not taking: Reported on 03/29/2016 03/25/15   Mercedes Camprubi-Soms, PA-C  ondansetron (ZOFRAN) 8 MG tablet Take  1 tablet (8 mg total) by mouth every 8 (eight) hours as needed for nausea or vomiting. Patient not taking: Reported on 03/29/2016 03/25/15   Mercedes Camprubi-Soms, PA-C  oxyCODONE (ROXICODONE) 5 MG immediate release tablet Take 1 tablet (5 mg total) by mouth every 4 (four) hours as needed for severe pain. Patient not taking: Reported on 03/29/2016 07/24/14   Carlisle Cater, PA-C  pregabalin (LYRICA) 50 MG capsule Take 1 capsule (50 mg total) by mouth 3 (three) times daily. Patient not taking: Reported on 03/29/2016 08/30/15   Micheline Chapman, NP  sulfamethoxazole-trimethoprim (BACTRIM DS,SEPTRA DS) 800-160 MG tablet Take 2 tablets by mouth 3 (three) times daily. x21 days Patient not taking: Reported on 03/29/2016 03/25/15   Mercedes Camprubi-Soms, PA-C    Family History Family History  Problem Relation Age of Onset  . Cancer Mother     Social History Social History  Substance Use Topics  . Smoking status: Current Every Day Smoker    Packs/day: 0.30    Types: Cigarettes  . Smokeless tobacco: Never Used     Comment: trying to cut back  . Alcohol use No     Allergies   Review of patient's allergies indicates no known allergies.   Review of Systems Review of Systems All other systems negative unless otherwise stated in HPI   Physical Exam Updated Vital Signs BP 116/76 (BP Location: Right Arm)   Pulse 61   Temp 98 F (36.7 C) (Oral)   Resp 18   Ht 5\' 3"  (1.6 m)   Wt 81.2 kg   SpO2 100%   BMI 31.71 kg/m   Physical Exam  Constitutional: She is oriented to person, place, and time. She appears well-developed and well-nourished.  Non-toxic appearance. She does not have a sickly appearance. She does not appear ill.  HENT:  Head: Normocephalic and atraumatic.  Mouth/Throat: Oropharynx is clear and moist.  Eyes: Conjunctivae are normal.  Neck: Normal range of motion. Neck supple.  No cervical midline tenderness.  FAROM of cervical spine.   Cardiovascular: Normal rate, regular  rhythm, normal heart sounds and intact distal pulses.   Pulses:      Radial pulses are 2+ on the right side, and 2+ on the left side.  Pulmonary/Chest: Effort normal and breath sounds normal. No accessory muscle usage or stridor. No respiratory distress. She has no wheezes. She has no rhonchi. She has no rales. She exhibits no tenderness.  Abdominal: Soft. Bowel sounds are normal. She exhibits no distension. There is no tenderness.  Musculoskeletal: Normal range of motion. She exhibits tenderness.  Left shoulder non-tender with full active ROM without pain.  She has point tenderness at distal triceps.  There is no overlying erythema, warmth, induration, or fluctuance.  No bruising.  No swelling.   Lymphadenopathy:    She has no cervical adenopathy.  Neurological: She is alert and oriented to person, place, and time.  Speech clear without dysarthria.  Skin: Skin is warm and dry.  Psychiatric: She has a normal mood and affect. Her behavior is normal.  She appears  worried, but NAD.     ED Treatments / Results  Labs (all labs ordered are listed, but only abnormal results are displayed) Labs Reviewed  BASIC METABOLIC PANEL - Abnormal; Notable for the following:       Result Value   Glucose, Bld 158 (*)    All other components within normal limits  CBC - Abnormal; Notable for the following:    WBC 3.7 (*)    Hemoglobin 15.6 (*)    HCT 48.1 (*)    MCV 104.6 (*)    Platelets 104 (*)    All other components within normal limits  I-STAT TROPOININ, ED    EKG  EKG Interpretation None      ED ECG REPORT   Date: 03/29/2016  Rate: 77  Rhythm: normal sinus rhythm  QRS Axis: normal  Intervals: normal  ST/T Wave abnormalities: nonspecific T wave changes  Conduction Disutrbances:none  Narrative Interpretation:   Old EKG Reviewed: none available  I have personally reviewed the EKG tracing and agree with the computerized printout as noted.  Radiology Dg Chest 2 View  Result Date:  03/29/2016 CLINICAL DATA:  Chest pain and shortness of breath for 4 days. Smoker. EXAM: CHEST  2 VIEW COMPARISON:  03/25/2015 FINDINGS: The cardiomediastinal silhouette is within normal limits. The patient has taken a greater inspiration than on the prior study and there is improved aeration of the medial right lung base. Mild scarring in the left lung apex is unchanged. There is no evidence of acute airspace consolidation, edema, pleural effusion, or pneumothorax. No acute osseous abnormality is identified. IMPRESSION: No active cardiopulmonary disease. Electronically Signed   By: Logan Bores M.D.   On: 03/29/2016 15:26    Procedures Procedures (including critical care time)  Medications Ordered in ED Medications - No data to display   Initial Impression / Assessment and Plan / ED Course  I have reviewed the triage vital signs and the nursing notes.  Pertinent labs & imaging results that were available during my care of the patient were reviewed by me and considered in my medical decision making (see chart for details).  Clinical Course    Patient presents with right shoulder pain, atraumatic.  No overlying infection.  Point tenderness at distal tricep.  FAROM.  Good perfusion.  She also complains of intermittent chest tightness, likely anxiety related.  She appears worried, but looking back in her chart, it appears this is her baseline.  No specific trigger.  HEART score 3.  Low risk Well's criteria.  CXR without acute abnormalities.  EKG without ischemia.  Labs without acute abnormalities. Troponin x 1 0.00.  Plan to delta troponin and repeat EKG.  5:50 PM: Called to patient room, patient stating she will be leaving and does not wish to stay for further tests.  Discussed with her that we have not finished her cardiac work up and that she would be leaving [place] and that we were not able to effectively rule out life threatening causes and risk of death.  Patient voices  understanding and wishes to leave AMA.    Final Clinical Impressions(s) / ED Diagnoses   Final diagnoses:  Right shoulder pain, unspecified chronicity  Chest tightness    New Prescriptions Discharge Medication List as of 03/29/2016  6:17 PM       Gloriann Loan, PA-C 03/29/16 1834    Gareth Morgan, MD 03/30/16 1307

## 2016-03-29 NOTE — ED Notes (Signed)
Patient stated she wants to leave. Staff advised patient against leaving. Patient states she wants to go home anyway.

## 2016-04-10 ENCOUNTER — Ambulatory Visit (INDEPENDENT_AMBULATORY_CARE_PROVIDER_SITE_OTHER): Payer: Medicaid Other | Admitting: Infectious Diseases

## 2016-04-10 ENCOUNTER — Encounter: Payer: Self-pay | Admitting: Infectious Diseases

## 2016-04-10 VITALS — Temp 98.7°F | Ht 63.0 in | Wt 190.0 lb

## 2016-04-10 DIAGNOSIS — Z79899 Other long term (current) drug therapy: Secondary | ICD-10-CM

## 2016-04-10 DIAGNOSIS — Z23 Encounter for immunization: Secondary | ICD-10-CM | POA: Diagnosis not present

## 2016-04-10 DIAGNOSIS — Z720 Tobacco use: Secondary | ICD-10-CM | POA: Diagnosis not present

## 2016-04-10 DIAGNOSIS — G629 Polyneuropathy, unspecified: Secondary | ICD-10-CM

## 2016-04-10 DIAGNOSIS — B182 Chronic viral hepatitis C: Secondary | ICD-10-CM

## 2016-04-10 DIAGNOSIS — B20 Human immunodeficiency virus [HIV] disease: Secondary | ICD-10-CM

## 2016-04-10 DIAGNOSIS — Z113 Encounter for screening for infections with a predominantly sexual mode of transmission: Secondary | ICD-10-CM

## 2016-04-10 NOTE — Assessment & Plan Note (Signed)
Will continue her genvoya.  Will try to get her in for PAP, mammo Get her into PCP Flu shot today rtc in 6 months

## 2016-04-10 NOTE — Assessment & Plan Note (Signed)
Will repeat ultrasound with question of edema.

## 2016-04-10 NOTE — Progress Notes (Signed)
   Subjective:    Patient ID: Ruth Stephenson, female    DOB: Dec 20, 1950, 65 y.o.   MRN: [id number]  HPI 65 yo F with HIV+ since 1989, on genvoya. She also has a hx of Hep C that appears to have been treated ([place]) 2015. 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  States she is fine with regards to her HIV. Feels like her body is deteriorating fast- can't sleep due to muscle spasms in legs, feels like the circulation in her legs is poor as well.  Concerned about her wt gain. Worried she has swelling in her hands. Has not been back to PCP (didn't like her).  Can't exercise due to muscle pain in her legs.  Becomes tearful that she is losing her mobility.   HIV 1 RNA Quant (copies/mL)  Date Value  01/11/2016 <20  11/30/2014 <20  09/15/2014 46 (H)   CD4 T Cell Abs (/uL)  Date Value  01/11/2016 290 (L)  11/30/2014 230 (L)  09/15/2014 230 (L)   Last mammo- ? Last PAP- ?  Review of Systems See HPI.     Objective:   Physical Exam  Constitutional: She appears well-developed and well-nourished.  HENT:  Mouth/Throat: No oropharyngeal exudate.  Eyes: EOM are normal. Pupils are equal, round, and reactive to light.  Neck: Neck supple.  Cardiovascular: Normal rate, regular rhythm and normal heart sounds.   Pulmonary/Chest: Effort normal and breath sounds normal.  Abdominal: Soft. Bowel sounds are normal. There is no tenderness. There is no rebound.  Musculoskeletal: She exhibits edema.  Lymphadenopathy:    She has no cervical adenopathy.  Skin: Skin is warm and dry. No erythema.      Assessment & Plan:

## 2016-04-10 NOTE — Addendum Note (Signed)
Addended by: Hisashi Amadon C on: 04/10/2016 11:33 AM   Modules accepted: Orders

## 2016-04-10 NOTE — Assessment & Plan Note (Signed)
Encouraged to quit. 

## 2016-04-10 NOTE — Assessment & Plan Note (Signed)
Will try to get into pain clinic.

## 2016-04-10 NOTE — Addendum Note (Signed)
Addended by: Nidhi Jacome C on: 04/10/2016 11:13 AM   Modules accepted: Orders

## 2016-04-11 ENCOUNTER — Telehealth: Payer: Self-pay | Admitting: Lab

## 2016-04-11 ENCOUNTER — Other Ambulatory Visit: Payer: Self-pay | Admitting: Infectious Diseases

## 2016-04-11 DIAGNOSIS — Z1231 Encounter for screening mammogram for malignant neoplasm of breast: Secondary | ICD-10-CM

## 2016-04-11 MED FILL — GENVOYA TABLET: 150-150-200 | 30 days supply | Qty: 30 | Fill #1

## 2016-04-11 NOTE — Telephone Encounter (Signed)
No prior auth needed for US/Abdomen per [place] from [facility].  Pt is ware of appmt, prep instructions given and the location for service given to the pt

## 2016-04-12 ENCOUNTER — Telehealth: Payer: Self-pay | Admitting: Lab

## 2016-04-12 ENCOUNTER — Other Ambulatory Visit: Payer: Self-pay | Admitting: Infectious Diseases

## 2016-04-12 ENCOUNTER — Other Ambulatory Visit: Payer: Medicaid Other

## 2016-04-12 DIAGNOSIS — B20 Human immunodeficiency virus [HIV] disease: Secondary | ICD-10-CM

## 2016-04-12 DIAGNOSIS — Z79899 Other long term (current) drug therapy: Secondary | ICD-10-CM

## 2016-04-12 DIAGNOSIS — B1710 Acute hepatitis C without hepatic coma: Secondary | ICD-10-CM

## 2016-04-12 DIAGNOSIS — Z113 Encounter for screening for infections with a predominantly sexual mode of transmission: Secondary | ICD-10-CM

## 2016-04-12 LAB — CBC
HEMATOCRIT: 43.7 % (ref 35.0–45.0)
HEMOGLOBIN: 14.8 g/dL (ref 11.7–15.5)
MCH: 33.6 pg — AB (ref 27.0–33.0)
MCHC: 33.9 g/dL (ref 32.0–36.0)
MCV: 99.3 fL (ref 80.0–100.0)
MPV: 10.9 fL (ref 7.5–12.5)
Platelets: 78 10*3/uL — ABNORMAL LOW (ref 140–400)
RBC: 4.4 MIL/uL (ref 3.80–5.10)
RDW: 13.4 % (ref 11.0–15.0)
WBC: 2.6 10*3/uL — AB (ref 3.8–10.8)

## 2016-04-12 NOTE — Telephone Encounter (Signed)
Pt has an appmt for mamo on 10/26@ 1210.  Pt is aware and was given the address and phone # to [place]. [employer] [phone]

## 2016-04-13 LAB — LIPID PANEL
CHOL/HDL RATIO: 2.6 ratio (ref <=5.0)
Cholesterol: 133 mg/dL (ref 125–200)
HDL: 52 mg/dL (ref >=46)
LDL CALC: 67 mg/dL (ref <130)
TRIGLYCERIDES: 68 mg/dL (ref <150)
VLDL: 14 mg/dL (ref <30)

## 2016-04-13 LAB — COMPREHENSIVE METABOLIC PANEL
ALBUMIN: 3.5 g/dL — AB (ref 3.6–5.1)
ALT: 10 U/L (ref 6–29)
AST: 17 U/L (ref 10–35)
Alkaline Phosphatase: 65 U/L (ref 33–130)
BUN: 8 mg/dL (ref 7–25)
CALCIUM: 9 mg/dL (ref 8.6–10.4)
CHLORIDE: 105 mmol/L (ref 98–110)
CO2: 29 mmol/L (ref 20–31)
Creat: 0.63 mg/dL (ref 0.50–0.99)
GLUCOSE: 119 mg/dL — AB (ref 65–99)
POTASSIUM: 4.2 mmol/L (ref 3.5–5.3)
Sodium: 142 mmol/L (ref 135–146)
Total Bilirubin: 0.5 mg/dL (ref 0.2–1.2)
Total Protein: 7 g/dL (ref 6.1–8.1)

## 2016-04-13 LAB — T-HELPER CELL (CD4) - (RCID CLINIC ONLY)
CD4 % Helper T Cell: 19 % — ABNORMAL LOW (ref 33–55)
CD4 T Cell Abs: 160 /uL — ABNORMAL LOW (ref 400–2700)

## 2016-04-13 LAB — RPR

## 2016-04-14 LAB — HEPATITIS C RNA QUANTITATIVE: HCV QUANT: NOT DETECTED [IU]/mL (ref <15)

## 2016-04-17 ENCOUNTER — Ambulatory Visit: Payer: Medicaid Other | Admitting: Infectious Diseases

## 2016-04-18 ENCOUNTER — Telehealth: Payer: Self-pay | Admitting: Lab

## 2016-04-18 NOTE — Telephone Encounter (Addendum)
New referral information was faxed to [place] on 04/11/2016-Called the office today for an update.  I had to leave a message. 10/24-  & 10/27-Left messages at 2 different extensions.  I have yet to speak with a person. Therefore, I am sending referral to another office [facility]-[phone]

## 2016-04-20 ENCOUNTER — Inpatient Hospital Stay: Admission: RE | Admit: 2016-04-20 | Payer: Medicaid Other | Source: Ambulatory Visit

## 2016-04-21 ENCOUNTER — Other Ambulatory Visit (HOSPITAL_COMMUNITY)
Admission: RE | Admit: 2016-04-21 | Discharge: 2016-04-21 | Disposition: A | Discharge status: Home / Self Care | Payer: Medicaid Other | Source: Ambulatory Visit | Attending: Infectious Diseases | Admitting: Infectious Diseases

## 2016-04-21 ENCOUNTER — Ambulatory Visit (INDEPENDENT_AMBULATORY_CARE_PROVIDER_SITE_OTHER): Payer: Medicaid Other | Admitting: *Deleted

## 2016-04-21 DIAGNOSIS — Z124 Encounter for screening for malignant neoplasm of cervix: Secondary | ICD-10-CM

## 2016-04-21 DIAGNOSIS — Z1231 Encounter for screening mammogram for malignant neoplasm of breast: Secondary | ICD-10-CM

## 2016-04-21 DIAGNOSIS — Z113 Encounter for screening for infections with a predominantly sexual mode of transmission: Secondary | ICD-10-CM | POA: Insufficient documentation

## 2016-04-21 DIAGNOSIS — Z01419 Encounter for gynecological examination (general) (routine) without abnormal findings: Secondary | ICD-10-CM | POA: Insufficient documentation

## 2016-04-21 NOTE — Progress Notes (Signed)
Subjective:     Ruth Stephenson is a 65 y.o. woman who comes in today for a  pap smear only. It has been several years since the patient's last pap smear. Previous abnormal Pap smear:  No. Contraception: condoms, menopausal.  Objective:    There were no vitals taken for this visit. Pelvic Exam: Pap smear obtained.   Assessment:    Screening pap smear.   Plan:    Follow up in one year, or as indicated by Pap results.  Pt given educational materials re: HIV and women, self-esteem, BSE, nutrition and diet management, PAP smears and partner safety. Pt given condoms.

## 2016-04-21 NOTE — Patient Instructions (Signed)
  Your results will be ready in about a week.  I will send them to you in MyChart.  Thank you for coming to the Center for your care.  Happy Halloween!!  Langley Gauss, RN

## 2016-04-24 LAB — CYTOLOGY - PAP: Diagnosis: NEGATIVE

## 2016-04-24 LAB — CERVICOVAGINAL ANCILLARY ONLY
Chlamydia: NEGATIVE
Neisseria Gonorrhea: NEGATIVE

## 2016-04-25 ENCOUNTER — Encounter: Payer: Self-pay | Admitting: *Deleted

## 2016-05-01 NOTE — Telephone Encounter (Signed)
I called [facility] (2nd office sent) for update on referral faxed 10/30-Jasmine said they didn't have referral??  However, I have the confirmation sheet that reflects that it was received.  I faxed it again today and I called the office to confirm that it was received.  Jasmine confirmed that she received it and gave it to the Referral Coordinator, Virgilio Belling. Jasmine stated that  [place]  would call me with update.

## 2016-05-01 NOTE — Telephone Encounter (Signed)
Pt said she got confused about her appmt for the mamo.  I asked her to call and reschedule the appmt  I also asked her  to let me know when it is.

## 2016-05-03 NOTE — Telephone Encounter (Signed)
Pt is now scheduled to see Dr. Mirna Mires on 06/28/2016 at [facility]

## 2016-05-10 MED FILL — GENVOYA TABLET: 150-150-200 | 30 days supply | Qty: 30 | Fill #2 | Status: TO

## 2016-05-12 NOTE — Telephone Encounter (Signed)
Also, I left a message for her to call me  on 05/05/2016.

## 2016-05-12 NOTE — Telephone Encounter (Signed)
Tried to reached patient regarding the mammogram appointment that she missed and needs to be rescheduled.  I couldn't leave a message because mailbox was full.  I sent an SMS message which shows my office number.

## 2016-05-23 ENCOUNTER — Telehealth: Payer: Self-pay | Admitting: *Deleted

## 2016-05-23 NOTE — Telephone Encounter (Signed)
Call from [facility] with [facility] precert department stating that this patient has an ultrasound scheduled for 05/25/16 and it requires prior authorization through [facility] [place]. Referral coordinator, Diane notified. Myrtis Hopping CMA

## 2016-05-23 NOTE — Telephone Encounter (Signed)
3rd attempt-I left a message for patient to call regarding missed [facility] appointment

## 2016-05-25 ENCOUNTER — Ambulatory Visit (HOSPITAL_COMMUNITY): Payer: Medicaid Other

## 2016-05-25 NOTE — Telephone Encounter (Signed)
Received fax approval for ultrasound [id number] valid 11/28 - 12/28

## 2016-05-31 ENCOUNTER — Encounter: Payer: Self-pay | Admitting: *Deleted

## 2016-05-31 ENCOUNTER — Telehealth: Payer: Self-pay | Admitting: Lab

## 2016-05-31 NOTE — Telephone Encounter (Signed)
Patient missed her appointment for the [place] on 11/30-PA expires 06/22/16.  She also missed her mamo appointment on 10/26.  I was able to reach her today.  She stated that she had a lot of family issues going on and she has been busy with that situation.  I asked her  o call me when things settle down so that we can get her appointments rescheduled.

## 2016-07-11 NOTE — Telephone Encounter (Signed)
Patient had an appointment with [facility] on 06/28/16.  She was a NO SHOW.

## 2016-07-26 ENCOUNTER — Other Ambulatory Visit: Payer: Self-pay | Admitting: Pharmacist Clinician (PhC)/ Clinical Pharmacy Specialist

## 2016-07-26 MED ORDER — DULOXETINE HCL 30 MG PO CPEP: 30.0000 mg | ORAL_CAPSULE | Freq: Every day | ORAL | 3 refills | Status: DC

## 2016-07-26 NOTE — Progress Notes (Signed)
Ruth Stephenson called Ruth Stephenson about her monthly ART refills. She told me about the peripheral neuropathy again. This is not new. She has had this issue for a while. She couldn't tolerate elavil or lyrica in the past. I'm going to try low dose Cymbalta in her and keep the dose at 30mg  due to the [facility].

## 2016-08-28 MED FILL — GENVOYA TABLET: 150-150-200 | 30 days supply | Qty: 30 | Fill #0

## 2016-09-20 ENCOUNTER — Other Ambulatory Visit: Payer: Medicaid Other

## 2016-10-04 ENCOUNTER — Ambulatory Visit: Payer: Medicaid Other | Admitting: Infectious Diseases

## 2016-11-13 ENCOUNTER — Ambulatory Visit: Payer: Medicaid Other | Admitting: Infectious Diseases

## 2016-11-27 ENCOUNTER — Telehealth: Payer: Self-pay | Admitting: *Deleted

## 2016-11-27 ENCOUNTER — Other Ambulatory Visit: Payer: Self-pay | Admitting: *Deleted

## 2016-11-27 DIAGNOSIS — B20 Human immunodeficiency virus [HIV] disease: Secondary | ICD-10-CM

## 2016-11-27 DIAGNOSIS — Z9114 Patient's other noncompliance with medication regimen: Secondary | ICD-10-CM

## 2016-11-27 NOTE — Telephone Encounter (Signed)
Received a message from [place] via email stating "Hello [name],   Our patient Ruth Stephenson, dob 2051-04-19, is out of care.  She last had a refill of her HIV meds 08/28/16.  She promised she would contact her case manager with Medicaid to get the incorrect code out of their system (it states she has other insurance, which she does not).  We have called her multiple times...about 10.  Spoke with her several times and I offered to make a three-way call with her to Medicaid.  She seemed grateful, but would not make the time to do so...always had to leave for an appointment.  Is she someone you can reach out to?  I am still offering to help her get this straightened out.  [name].   I happily agreed to connect with the patient and referral has been placed for Dr Megan Salon to approve

## 2016-12-04 ENCOUNTER — Telehealth: Payer: Self-pay | Admitting: *Deleted

## 2016-12-04 NOTE — Telephone Encounter (Signed)
RN received a referral. Contacted the patient to offer services but was unable to get an answer or a voicemail. RN will f/u on referral again

## 2016-12-18 ENCOUNTER — Telehealth: Payer: Self-pay | Admitting: Pharmacy Technician

## 2016-12-18 NOTE — Telephone Encounter (Signed)
Have tried to reach Ms. Steppe multiple times to help her make a call to her case manager at [facility] to get her issue resolved so that her HIV med can be refilled.  Twice I got her on the phone, she was interested in my help, but didn't have the time to make the call.  I asked Claudell Kyle, home health nurse to help.  She said she would gladly reach out to Ms. Ricard Dillon.

## 2017-01-02 ENCOUNTER — Emergency Department (HOSPITAL_COMMUNITY)
Admission: EM | Admit: 2017-01-02 | Discharge: 2017-01-02 | Discharge status: Against Medical Advice | Payer: Medicaid Other | Attending: Emergency Medicine | Admitting: Emergency Medicine

## 2017-01-02 DIAGNOSIS — Z5321 Procedure and treatment not carried out due to patient leaving prior to being seen by health care provider: Secondary | ICD-10-CM | POA: Insufficient documentation

## 2017-01-02 DIAGNOSIS — M545 Low back pain: Secondary | ICD-10-CM | POA: Diagnosis present
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
## 2017-02-21 ENCOUNTER — Telehealth: Payer: Self-pay | Admitting: *Deleted

## 2017-02-21 NOTE — Telephone Encounter (Signed)
RN contacted the patient as an 3rd attempt to connected/engage. RN lunable to leave amessage stating that I wanted to be sure all is well and to please let me know if I can assist in any way.   Patient has missed several appts for research and MD visits. She does not have any upcoming appts either. Current number on file is not active at this time

## 2017-02-22 ENCOUNTER — Emergency Department (HOSPITAL_COMMUNITY)
Admission: EM | Admit: 2017-02-22 | Discharge: 2017-02-22 | Disposition: A | Discharge status: Home / Self Care | Payer: Medicaid Other | Attending: Physician Assistant | Admitting: Physician Assistant

## 2017-02-22 ENCOUNTER — Encounter (HOSPITAL_COMMUNITY): Payer: Self-pay | Admitting: Emergency Medicine

## 2017-02-22 ENCOUNTER — Emergency Department (HOSPITAL_COMMUNITY): Payer: Medicaid Other

## 2017-02-22 DIAGNOSIS — Z79899 Other long term (current) drug therapy: Secondary | ICD-10-CM | POA: Insufficient documentation

## 2017-02-22 DIAGNOSIS — B20 Human immunodeficiency virus [HIV] disease: Secondary | ICD-10-CM | POA: Diagnosis not present

## 2017-02-22 DIAGNOSIS — F17210 Nicotine dependence, cigarettes, uncomplicated: Secondary | ICD-10-CM | POA: Insufficient documentation

## 2017-02-22 DIAGNOSIS — R05 Cough: Secondary | ICD-10-CM | POA: Insufficient documentation

## 2017-02-22 DIAGNOSIS — J40 Bronchitis, not specified as acute or chronic: Secondary | ICD-10-CM | POA: Diagnosis not present

## 2017-02-22 DIAGNOSIS — Z85038 Personal history of other malignant neoplasm of large intestine: Secondary | ICD-10-CM | POA: Insufficient documentation

## 2017-02-22 DIAGNOSIS — R079 Chest pain, unspecified: Secondary | ICD-10-CM | POA: Diagnosis present

## 2017-02-22 LAB — BASIC METABOLIC PANEL
Anion gap: 7 (ref 5–15)
BUN: 7 mg/dL (ref 6–20)
CALCIUM: 9.4 mg/dL (ref 8.9–10.3)
CHLORIDE: 105 mmol/L (ref 101–111)
CO2: 30 mmol/L (ref 22–32)
CREATININE: 0.68 mg/dL (ref 0.44–1.00)
GFR calc non Af Amer: >60 mL/min (ref >60)
GLUCOSE: 125 mg/dL — AB (ref 65–99)
Potassium: 4 mmol/L (ref 3.5–5.1)
Sodium: 142 mmol/L (ref 135–145)

## 2017-02-22 LAB — CBC
HCT: 42.6 % (ref 36.0–46.0)
HEMOGLOBIN: 13.6 g/dL (ref 12.0–15.0)
MCH: 32.9 pg (ref 26.0–34.0)
MCHC: 31.9 g/dL (ref 30.0–36.0)
MCV: 103.1 fL — AB (ref 78.0–100.0)
Platelets: 99 10*3/uL — ABNORMAL LOW (ref 150–400)
RBC: 4.13 MIL/uL (ref 3.87–5.11)
RDW: 13.1 % (ref 11.5–15.5)
WBC: 2.9 10*3/uL — ABNORMAL LOW (ref 4.0–10.5)

## 2017-02-22 LAB — I-STAT TROPONIN, ED: TROPONIN I, POC: 0 ng/mL (ref 0.00–0.08)

## 2017-02-22 MED ORDER — GUAIFENESIN-CODEINE 100-10 MG/5ML PO SOLN: 5.0000 mL | Freq: Four times a day (QID) | ORAL | 0 refills | Status: DC | PRN

## 2017-02-22 MED ORDER — AZITHROMYCIN 250 MG PO TABS
500.0000 mg | ORAL_TABLET | Freq: Once | ORAL | Status: AC
  Administered 2017-02-22: 500 mg via ORAL
  Filled 2017-02-22: qty 2

## 2017-02-22 MED ORDER — ALBUTEROL SULFATE HFA 108 (90 BASE) MCG/ACT IN AERS
2.0000 | INHALATION_SPRAY | Freq: Once | RESPIRATORY_TRACT | Status: AC
  Administered 2017-02-22: 2 via RESPIRATORY_TRACT
  Filled 2017-02-22: qty 6.7

## 2017-02-22 MED ORDER — AZITHROMYCIN 250 MG PO TABS: ORAL_TABLET | ORAL | 0 refills | Status: DC

## 2017-02-22 NOTE — ED Triage Notes (Signed)
Pt c/o new productive cough x 4 days with  R sided CP x 2 days.  Pt reports frequent sweats, denies fever or chills, reports SOB, dizziness.  Pt reports hx HIV and Hep C,states, "levels are undetectable."

## 2017-02-22 NOTE — ED Provider Notes (Signed)
[facility] DEPT Provider Note   CSN: [id number] Arrival date & time: 02/22/17  1833     History   Chief Complaint Chief Complaint  Patient presents with  . Chest Pain  . Cough    HPI Ruth Stephenson is a 66 y.o. female.  HPI   Patient is a 66 year old female presenting with cough and sputum production. Patient is HIV positive but levels are undetectable. nt reports 3 days of cough. She reports when walks, she coughs more. Patient reports a sputum was dark green in color. She reports that it hurts when she coughs. No risk factors for PE.   No nausea no vomiting no recent travel.   Past Medical History:  Diagnosis Date  . Cancer ([place])   . Hepatitis C   . HIV (human immunodeficiency virus infection) [facility])     Patient Active Problem List   Diagnosis Date Noted  . Bilateral edema of lower extremity 07/29/2014  . History of colon cancer, stage II 07/29/2014  . Neuropathy 05/27/2014  . GERD (gastroesophageal reflux disease) 05/27/2014  . Cancer of right colon ([place]) 10/24/2012  . History of pancreatitis 09/19/2012  . HIV (human immunodeficiency virus infection) ([place]) 09/19/2012  . Hepatitis C 09/19/2012  . Tobacco abuse 09/19/2012  . Anemia 09/19/2012    Past Surgical History:  Procedure Laterality Date  . CARPAL TUNNEL RELEASE Left 01/19/2014   Procedure: LEFT CARPAL TUNNEL RELEASE;  Surgeon: Schuyler Amor, MD;  Location: [facility];  Service: Orthopedics;  Laterality: Left;  . COLON RESECTION N/A 09/23/2012   Procedure: LAPAROSCOPIC RIGHT COLON RESECTION   ;  Surgeon: Edward Jolly, MD;  Location: [facility];  Service: General;  Laterality: N/A;  LAPOROSCOPY, RIGHT HEMICOLECTOMY  . COLON SURGERY  09/23/2012   lap right colectomy  . COLONOSCOPY N/A 09/20/2012   Procedure: COLONOSCOPY;  Surgeon: Beryle Beams, MD;  Location: [facility];  Service: Endoscopy;  Laterality: N/A;  . OPEN REDUCTION INTERNAL FIXATION (ORIF) DISTAL RADIAL FRACTURE  Left 01/19/2014   Procedure: OPEN REDUCTION INTERNAL FIXATION (ORIF) LEFT  DISTAL RADIAL FRACTURE;  Surgeon: Schuyler Amor, MD;  Location: [facility];  Service: Orthopedics;  Laterality: Left;    OB History    No data available       Home Medications    Prior to Admission medications   Medication Sig Start Date End Date Taking? Authorizing Provider  elvitegravir-cobicistat-emtricitabine-tenofovir (GENVOYA) 150-150-200-10 MG TABS tablet Take 1 tablet by mouth daily with breakfast. 01/26/16  Yes Michel Bickers, MD  DULoxetine (CYMBALTA) 30 MG capsule Take 1 capsule (30 mg total) by mouth daily. Patient not taking: Reported on 02/22/2017 07/26/16   Dinah Beers, RPH  furosemide (LASIX) 20 MG tablet Take 1 tablet (20 mg total) by mouth daily. Patient not taking: Reported on 04/10/2016 08/30/15   Micheline Chapman, NP    Family History Family History  Problem Relation Age of Onset  . Cancer Mother     Social History Social History  Substance Use Topics  . Smoking status: Current Every Day Smoker    Packs/day: 0.30    Types: Cigarettes  . Smokeless tobacco: Never Used     Comment: trying to cut back  . Alcohol use No     Allergies   Patient has no known allergies.   Review of Systems Review of Systems  Constitutional: Negative for activity change and fever.  Respiratory: Positive for cough and shortness of breath.   Cardiovascular: Negative  for chest pain, palpitations and leg swelling.  Gastrointestinal: Negative for abdominal pain.     Physical Exam Updated Vital Signs BP 128/66 (BP Location: Left Arm)   Pulse (!) 58   Temp 98.9 F (37.2 C) (Oral)   Resp 15   Ht 5' 3.5" (1.613 m)   Wt 79.4 kg (175 lb)   SpO2 99%   BMI 30.51 kg/m   Physical Exam  Constitutional: She is oriented to person, place, and time. She appears well-developed and well-nourished.  HENT:  Head: Normocephalic and atraumatic.  Eyes: Right eye exhibits no discharge.    Cardiovascular: Normal rate, regular rhythm and normal heart sounds.   No murmur heard. Pulmonary/Chest: Effort normal. She has wheezes. She has no rales.  Abdominal: Soft. She exhibits no distension. There is no tenderness.  Neurological: She is oriented to person, place, and time.  Skin: Skin is warm and dry. She is not diaphoretic.  Psychiatric: She has a normal mood and affect.  Nursing note and vitals reviewed.    ED Treatments / Results  Labs (all labs ordered are listed, but only abnormal results are displayed) Labs Reviewed  BASIC METABOLIC PANEL - Abnormal; Notable for the following:       Result Value   Glucose, Bld 125 (*)    All other components within normal limits  CBC - Abnormal; Notable for the following:    WBC 2.9 (*)    MCV 103.1 (*)    Platelets 99 (*)    All other components within normal limits  I-STAT TROPONIN, ED    EKG  EKG Interpretation None       Radiology Dg Chest 2 View  Result Date: 02/22/2017 CLINICAL DATA:  Acute onset of right-sided chest pain and productive cough. Current history of HIV and hepatitis-C. Personal history of colon cancer. EXAM: CHEST  2 VIEW COMPARISON:  03/29/2016, 03/25/2015 and earlier. FINDINGS: Cardiac silhouette normal in size, unchanged. Thoracic aorta atherosclerotic, unchanged. Hilar and mediastinal contours otherwise unremarkable. Prominent bronchovascular markings diffusely and mild central peribronchial thickening, unchanged from prior examinations. No new pulmonary parenchymal abnormalities. No pleural effusions. Degenerative changes involving the thoracic spine. IMPRESSION: 1. Stable moderate chronic bronchitis and/or asthma. No acute cardiopulmonary disease. 2.  Aortic Atherosclerosis (ICD10-170.0) Electronically Signed   By: Evangeline Dakin M.D.   On: 02/22/2017 19:42    Procedures Procedures (including critical care time)  Medications Ordered in ED Medications  azithromycin (ZITHROMAX) tablet 500 mg  (not administered)  albuterol (PROVENTIL HFA;VENTOLIN HFA) 108 (90 Base) MCG/ACT inhaler 2 puff (not administered)     Initial Impression / Assessment and Plan / ED Course  I have reviewed the triage vital signs and the nursing notes.  Pertinent labs & imaging results that were available during my care of the patient were reviewed by me and considered in my medical decision making (see chart for details).    Patient is a 66 year old female presenting with cough and sputum production. Patient is HIV positive but levels are undetectable. nt reports 3 days of cough. She reports when walks, she coughs more. Patient reports a sputum was dark green in color. She reports that it hurts when she coughs. No risk factors for PE.  No nausea no vomiting no recent travel.   We'll treat for bronchitis given sputum production, cough.  Patuient with normal vitals and taking PO and ambualtory at time of discharge.   Final Clinical Impressions(s) / ED Diagnoses   Final diagnoses:  None  New Prescriptions New Prescriptions   No medications on file     Macarthur Critchley, MD 02/22/17 2207

## 2017-02-22 NOTE — Discharge Instructions (Signed)
We anticipate that you'll get better in the next 24-48 hours. Please return immediately with any concerns.

## 2017-04-03 ENCOUNTER — Ambulatory Visit: Payer: Self-pay | Admitting: Internal Medicine

## 2017-04-05 ENCOUNTER — Ambulatory Visit (INDEPENDENT_AMBULATORY_CARE_PROVIDER_SITE_OTHER): Payer: Medicaid Other | Admitting: Pharmacist Clinician (PhC)/ Clinical Pharmacy Specialist

## 2017-04-05 DIAGNOSIS — B20 Human immunodeficiency virus [HIV] disease: Secondary | ICD-10-CM

## 2017-04-05 MED ORDER — SULFAMETHOXAZOLE-TRIMETHOPRIM 400-80 MG PO TABS: 1.0000 | ORAL_TABLET | Freq: Every day | ORAL | 3 refills | Status: DC

## 2017-04-05 MED ORDER — ELVITEG-COBIC-EMTRICIT-TENOFAF 150-150-200-10 MG PO TABS: 1.0000 | ORAL_TABLET | Freq: Every day | ORAL | 3 refills | Status: DC

## 2017-04-05 MED ORDER — DARUN-COBIC-EMTRICIT-TENOFAF 800-150-200-10 MG PO TABS: 1.0000 | ORAL_TABLET | Freq: Every day | ORAL | 0 refills | Status: DC

## 2017-04-05 MED FILL — SYMTUZA 800-150-200-10 MG T: 800-150-200 | 30 days supply | Qty: 30 | Fill #0

## 2017-04-05 NOTE — Addendum Note (Signed)
Addended by: Dolan Amen D on: 04/05/2017 04:14 PM   Modules accepted: Orders

## 2017-04-05 NOTE — Progress Notes (Signed)
HPI: Ruth Stephenson is a 66 y.o. female who walked into out clinic after missing appts for a while.   Allergies: No Known Allergies  Vitals:    Past Medical History: Past Medical History:  Diagnosis Date  . Cancer ([place])   . Hepatitis C   . HIV (human immunodeficiency virus infection) ([place])     Social History: Social History   Social History  . Marital status: Single    Spouse name: N/A  . Number of children: N/A  . Years of education: N/A   Social History Main Topics  . Smoking status: Current Every Day Smoker    Packs/day: 0.30    Types: Cigarettes  . Smokeless tobacco: Never Used     Comment: trying to cut back  . Alcohol use No  . Drug use: No  . Sexual activity: Not on file     Comment: declined condoms   Other Topics Concern  . Not on file   Social History Narrative  . No narrative on file    Previous Regimen: Stribild  Current Regimen: Genvoya  Labs: HIV 1 RNA Quant (copies/mL)  Date Value  01/11/2016 <20  11/30/2014 <20  09/15/2014 46 (H)   CD4 T Cell Abs (/uL)  Date Value  04/12/2016 160 (L)  01/11/2016 290 (L)  11/30/2014 230 (L)   Hep B S Ab (no units)  Date Value  07/14/2013 NEG   Hepatitis B Surface Ag (no units)  Date Value  07/14/2013 NEGATIVE    CrCl: CrCl cannot be calculated (Patient's most recent lab result is older than the maximum 21 days allowed.).  Lipids:    Component Value Date/Time   CHOL 133 04/12/2016 1600   TRIG 68 04/12/2016 1600   HDL 52 04/12/2016 1600   CHOLHDL 2.6 04/12/2016 1600   VLDL 14 04/12/2016 1600   LDLCALC 67 04/12/2016 1600    Assessment: [name] has not been seen here since 03/2016. She has hx of chronic no shows. [facility] has been saying about the same thing about the difficulty of reaching her. She was put in jail for a week last month for shoplifting. She missed about 7 days of therapy. She stated that, that was the few times that she has missed her meds. She finally ran out of refills and  her pharmacy said that they can't fill it anymore due to coverage so she walked in today. She has medicaid, however, she also has medicare. Therefore, medicaid has refused to cover any meds until she get a Medicare part D. Advised her to go to the senior resource center [place] [facility]) so they can help her sign up for a medicare plan. She must do this immediately be she will not qualify for ADAP. She promised to go ASAP. In the meantime, we will use the [employer] card to cover her for 30 days to avoid an extensive gap. She will come back here when she gets a new plan. Set her up to f/u with Dr Johnnye Sima in Nov. Sje refused the flu shot today.   Recommendations:  [employer] 1 daily x30d (voucher card) Must sign up for medicare part D F/u with Dr. Johnnye Sima in Nov HIV labs today  Onnie Boer, PharmD, BCPS, AAHIVP, CPP Clinical Infectious Disease [facility] 04/05/2017, 3:24 PM

## 2017-04-05 NOTE — Patient Instructions (Addendum)
Pick up the [place] 1 daily at  [facility] up [facility] at [employer] on [address] Go to the senior resource center to sign up for Medicare part D

## 2017-04-06 LAB — BASIC METABOLIC PANEL
BUN: 9 mg/dL (ref 7–25)
CALCIUM: 9.4 mg/dL (ref 8.6–10.4)
CHLORIDE: 103 mmol/L (ref 98–110)
CO2: 30 mmol/L (ref 20–32)
Creat: 0.64 mg/dL (ref 0.50–0.99)
Glucose, Bld: 123 mg/dL — ABNORMAL HIGH (ref 65–99)
Potassium: 4.1 mmol/L (ref 3.5–5.3)
Sodium: 141 mmol/L (ref 135–146)

## 2017-04-06 LAB — T-HELPER CELL (CD4) - (RCID CLINIC ONLY)
CD4 T CELL ABS: 220 /uL — AB (ref 400–2700)
CD4 T CELL HELPER: 25 % — AB (ref 33–55)

## 2017-04-06 LAB — RPR: RPR: NONREACTIVE

## 2017-04-10 LAB — HIV RNA, RTPCR W/R GT (RTI, PI,INT)
HIV 1 RNA QUANT: NOT DETECTED {copies}/mL
HIV-1 RNA QUANT, LOG: NOT DETECTED {Log_copies}/mL

## 2017-04-27 ENCOUNTER — Encounter: Payer: Self-pay | Admitting: Pharmacist Clinician (PhC)/ Clinical Pharmacy Specialist

## 2017-05-04 ENCOUNTER — Encounter (HOSPITAL_COMMUNITY): Payer: Self-pay

## 2017-05-04 ENCOUNTER — Emergency Department (HOSPITAL_COMMUNITY): Payer: Medicare Other

## 2017-05-04 ENCOUNTER — Inpatient Hospital Stay (HOSPITAL_COMMUNITY)
Admission: EM | Admit: 2017-05-04 | Discharge: 2017-05-09 | DRG: 917 | Disposition: A | Discharge status: Home / Self Care | Payer: Medicare Other | Attending: Internal Medicine | Admitting: Internal Medicine

## 2017-05-04 DIAGNOSIS — J9602 Acute respiratory failure with hypercapnia: Secondary | ICD-10-CM | POA: Diagnosis present

## 2017-05-04 DIAGNOSIS — G9341 Metabolic encephalopathy: Secondary | ICD-10-CM | POA: Diagnosis present

## 2017-05-04 DIAGNOSIS — R402332 Coma scale, best motor response, abnormal, at arrival to emergency department: Secondary | ICD-10-CM | POA: Diagnosis present

## 2017-05-04 DIAGNOSIS — R402212 Coma scale, best verbal response, none, at arrival to emergency department: Secondary | ICD-10-CM | POA: Diagnosis present

## 2017-05-04 DIAGNOSIS — B182 Chronic viral hepatitis C: Secondary | ICD-10-CM | POA: Diagnosis present

## 2017-05-04 DIAGNOSIS — Z9119 Patient's noncompliance with other medical treatment and regimen: Secondary | ICD-10-CM | POA: Diagnosis not present

## 2017-05-04 DIAGNOSIS — D696 Thrombocytopenia, unspecified: Secondary | ICD-10-CM

## 2017-05-04 DIAGNOSIS — Z21 Asymptomatic human immunodeficiency virus [HIV] infection status: Secondary | ICD-10-CM | POA: Diagnosis present

## 2017-05-04 DIAGNOSIS — F1910 Other psychoactive substance abuse, uncomplicated: Secondary | ICD-10-CM | POA: Diagnosis not present

## 2017-05-04 DIAGNOSIS — R739 Hyperglycemia, unspecified: Secondary | ICD-10-CM | POA: Diagnosis present

## 2017-05-04 DIAGNOSIS — T405X1A Poisoning by cocaine, accidental (unintentional), initial encounter: Secondary | ICD-10-CM | POA: Diagnosis present

## 2017-05-04 DIAGNOSIS — R4182 Altered mental status, unspecified: Secondary | ICD-10-CM | POA: Diagnosis not present

## 2017-05-04 DIAGNOSIS — J9601 Acute respiratory failure with hypoxia: Secondary | ICD-10-CM | POA: Diagnosis present

## 2017-05-04 DIAGNOSIS — F17210 Nicotine dependence, cigarettes, uncomplicated: Secondary | ICD-10-CM | POA: Diagnosis present

## 2017-05-04 DIAGNOSIS — R140 Abdominal distension (gaseous): Secondary | ICD-10-CM | POA: Diagnosis not present

## 2017-05-04 DIAGNOSIS — D6959 Other secondary thrombocytopenia: Secondary | ICD-10-CM | POA: Diagnosis present

## 2017-05-04 DIAGNOSIS — T402X1A Poisoning by other opioids, accidental (unintentional), initial encounter: Secondary | ICD-10-CM | POA: Diagnosis present

## 2017-05-04 DIAGNOSIS — B20 Human immunodeficiency virus [HIV] disease: Secondary | ICD-10-CM

## 2017-05-04 DIAGNOSIS — K7682 Hepatic encephalopathy: Secondary | ICD-10-CM

## 2017-05-04 DIAGNOSIS — K7290 Hepatic failure, unspecified without coma: Secondary | ICD-10-CM | POA: Diagnosis present

## 2017-05-04 DIAGNOSIS — R4020 Unspecified coma: Secondary | ICD-10-CM | POA: Diagnosis not present

## 2017-05-04 DIAGNOSIS — K7460 Unspecified cirrhosis of liver: Secondary | ICD-10-CM | POA: Diagnosis present

## 2017-05-04 DIAGNOSIS — T424X1A Poisoning by benzodiazepines, accidental (unintentional), initial encounter: Principal | ICD-10-CM | POA: Diagnosis present

## 2017-05-04 DIAGNOSIS — D72819 Decreased white blood cell count, unspecified: Secondary | ICD-10-CM

## 2017-05-04 DIAGNOSIS — J690 Pneumonitis due to inhalation of food and vomit: Secondary | ICD-10-CM

## 2017-05-04 DIAGNOSIS — I340 Nonrheumatic mitral (valve) insufficiency: Secondary | ICD-10-CM | POA: Diagnosis not present

## 2017-05-04 DIAGNOSIS — J90 Pleural effusion, not elsewhere classified: Secondary | ICD-10-CM | POA: Diagnosis not present

## 2017-05-04 DIAGNOSIS — R0989 Other specified symptoms and signs involving the circulatory and respiratory systems: Secondary | ICD-10-CM | POA: Diagnosis not present

## 2017-05-04 DIAGNOSIS — R402112 Coma scale, eyes open, never, at arrival to emergency department: Secondary | ICD-10-CM | POA: Diagnosis present

## 2017-05-04 DIAGNOSIS — Z9911 Dependence on respirator [ventilator] status: Secondary | ICD-10-CM

## 2017-05-04 DIAGNOSIS — J439 Emphysema, unspecified: Secondary | ICD-10-CM | POA: Diagnosis present

## 2017-05-04 DIAGNOSIS — T50904A Poisoning by unspecified drugs, medicaments and biological substances, undetermined, initial encounter: Secondary | ICD-10-CM | POA: Diagnosis not present

## 2017-05-04 DIAGNOSIS — J9811 Atelectasis: Secondary | ICD-10-CM | POA: Diagnosis not present

## 2017-05-04 DIAGNOSIS — Z85038 Personal history of other malignant neoplasm of large intestine: Secondary | ICD-10-CM

## 2017-05-04 DIAGNOSIS — T50901A Poisoning by unspecified drugs, medicaments and biological substances, accidental (unintentional), initial encounter: Secondary | ICD-10-CM | POA: Diagnosis present

## 2017-05-04 LAB — GLUCOSE, CAPILLARY: Glucose-Capillary: 85 mg/dL (ref 65–99)

## 2017-05-04 LAB — I-STAT CHEM 8, ED
BUN: 9 mg/dL (ref 6–20)
CREATININE: 0.9 mg/dL (ref 0.44–1.00)
Calcium, Ion: 1.2 mmol/L (ref 1.15–1.40)
Chloride: 103 mmol/L (ref 101–111)
GLUCOSE: 120 mg/dL — AB (ref 65–99)
HEMATOCRIT: 48 % — AB (ref 36.0–46.0)
Hemoglobin: 16.3 g/dL — ABNORMAL HIGH (ref 12.0–15.0)
POTASSIUM: 4.3 mmol/L (ref 3.5–5.1)
Sodium: 141 mmol/L (ref 135–145)
TCO2: 29 mmol/L (ref 22–32)

## 2017-05-04 LAB — COMPREHENSIVE METABOLIC PANEL
ALBUMIN: 3.9 g/dL (ref 3.5–5.0)
ALK PHOS: 90 U/L (ref 38–126)
ALT: 16 U/L (ref 14–54)
ANION GAP: 6 (ref 5–15)
AST: 23 U/L (ref 15–41)
BILIRUBIN TOTAL: 1.1 mg/dL (ref 0.3–1.2)
BUN: 7 mg/dL (ref 6–20)
CALCIUM: 9.4 mg/dL (ref 8.9–10.3)
CO2: 28 mmol/L (ref 22–32)
CREATININE: 0.98 mg/dL (ref 0.44–1.00)
Chloride: 105 mmol/L (ref 101–111)
GFR calc non Af Amer: 59 mL/min — ABNORMAL LOW (ref >60)
GLUCOSE: 117 mg/dL — AB (ref 65–99)
Potassium: 4.3 mmol/L (ref 3.5–5.1)
Sodium: 139 mmol/L (ref 135–145)
Total Protein: 8.3 g/dL — ABNORMAL HIGH (ref 6.5–8.1)

## 2017-05-04 LAB — PROTIME-INR
INR: 1.23
PROTHROMBIN TIME: 15.4 s — AB (ref 11.4–15.2)

## 2017-05-04 LAB — CORTISOL: CORTISOL PLASMA: 18 ug/dL

## 2017-05-04 LAB — COOXEMETRY PANEL
Carboxyhemoglobin: 5.1 % (ref 0.5–1.5)
METHEMOGLOBIN: 0.7 % (ref 0.0–1.5)
O2 Saturation: 89.2 %
Total hemoglobin: 14.5 g/dL (ref 12.0–16.0)

## 2017-05-04 LAB — CBC WITH DIFFERENTIAL/PLATELET
Basophils Absolute: 0 10*3/uL (ref 0.0–0.1)
Basophils Relative: 0 %
Eosinophils Absolute: 0 10*3/uL (ref 0.0–0.7)
Eosinophils Relative: 0 %
HEMATOCRIT: 45.2 % (ref 36.0–46.0)
HEMOGLOBIN: 14.8 g/dL (ref 12.0–15.0)
LYMPHS ABS: 0.5 10*3/uL — AB (ref 0.7–4.0)
Lymphocytes Relative: 21 %
MCH: 33.5 pg (ref 26.0–34.0)
MCHC: 32.7 g/dL (ref 30.0–36.0)
MCV: 102.3 fL — ABNORMAL HIGH (ref 78.0–100.0)
MONOS PCT: 7 %
Monocytes Absolute: 0.2 10*3/uL (ref 0.1–1.0)
NEUTROS ABS: 1.7 10*3/uL (ref 1.7–7.7)
NEUTROS PCT: 71 %
Platelets: 74 10*3/uL — ABNORMAL LOW (ref 150–400)
RBC: 4.42 MIL/uL (ref 3.87–5.11)
RDW: 13 % (ref 11.5–15.5)
WBC: 2.3 10*3/uL — ABNORMAL LOW (ref 4.0–10.5)

## 2017-05-04 LAB — URINALYSIS, ROUTINE W REFLEX MICROSCOPIC
Bilirubin Urine: NEGATIVE
GLUCOSE, UA: NEGATIVE mg/dL
Ketones, ur: NEGATIVE mg/dL
LEUKOCYTES UA: NEGATIVE
NITRITE: NEGATIVE
PH: 5 (ref 5.0–8.0)
PROTEIN: 100 mg/dL — AB
SPECIFIC GRAVITY, URINE: 1.016 (ref 1.005–1.030)

## 2017-05-04 LAB — I-STAT ARTERIAL BLOOD GAS, ED
Acid-Base Excess: 1 mmol/L (ref 0.0–2.0)
BICARBONATE: 30.9 mmol/L — AB (ref 20.0–28.0)
O2 SAT: 99 %
TCO2: 33 mmol/L — AB (ref 22–32)
pCO2 arterial: 67.4 mmHg (ref 32.0–48.0)
pH, Arterial: 7.266 — ABNORMAL LOW (ref 7.350–7.450)
pO2, Arterial: 177 mmHg — ABNORMAL HIGH (ref 83.0–108.0)

## 2017-05-04 LAB — POCT I-STAT 3, ART BLOOD GAS (G3+)
ACID-BASE EXCESS: 3 mmol/L — AB (ref 0.0–2.0)
BICARBONATE: 28.2 mmol/L — AB (ref 20.0–28.0)
O2 Saturation: 99 %
TCO2: 30 mmol/L (ref 22–32)
pCO2 arterial: 44.7 mmHg (ref 32.0–48.0)
pH, Arterial: 7.406 (ref 7.350–7.450)
pO2, Arterial: 119 mmHg — ABNORMAL HIGH (ref 83.0–108.0)

## 2017-05-04 LAB — I-STAT CG4 LACTIC ACID, ED
LACTIC ACID, VENOUS: 1.21 mmol/L (ref 0.5–1.9)
Lactic Acid, Venous: 0.86 mmol/L (ref 0.5–1.9)

## 2017-05-04 LAB — ACETAMINOPHEN LEVEL

## 2017-05-04 LAB — RAPID URINE DRUG SCREEN, HOSP PERFORMED
AMPHETAMINES: NOT DETECTED
BENZODIAZEPINES: POSITIVE — AB
Barbiturates: NOT DETECTED
Cocaine: POSITIVE — AB
Opiates: POSITIVE — AB
TETRAHYDROCANNABINOL: NOT DETECTED

## 2017-05-04 LAB — ETHANOL

## 2017-05-04 LAB — SALICYLATE LEVEL

## 2017-05-04 LAB — AMMONIA: AMMONIA: 66 umol/L — AB (ref 9–35)

## 2017-05-04 LAB — I-STAT TROPONIN, ED: Troponin i, poc: 0 ng/mL (ref 0.00–0.08)

## 2017-05-04 LAB — FIBRINOGEN: Fibrinogen: 337 mg/dL (ref 210–475)

## 2017-05-04 LAB — CBG MONITORING, ED
Glucose-Capillary: 115 mg/dL — ABNORMAL HIGH (ref 65–99)
Glucose-Capillary: 109 mg/dL — ABNORMAL HIGH (ref 65–99)

## 2017-05-04 LAB — APTT: APTT: 29 s (ref 24–36)

## 2017-05-04 MED ORDER — NALOXONE HCL 2 MG/2ML IJ SOSY
PREFILLED_SYRINGE | INTRAMUSCULAR | Status: AC
  Administered 2017-05-04: 2 mg
  Filled 2017-05-04: qty 2

## 2017-05-04 MED ORDER — NALOXONE HCL 2 MG/2ML IJ SOSY
PREFILLED_SYRINGE | INTRAMUSCULAR | Status: AC
Start: 2017-05-04 — End: 2017-05-04
  Administered 2017-05-04: 1 mg
  Filled 2017-05-04: qty 2

## 2017-05-04 MED ORDER — LACTULOSE 10 GM/15ML PO SOLN
30.0000 g | Freq: Two times a day (BID) | ORAL | Status: DC
  Administered 2017-05-04 – 2017-05-05 (×2): 30 g
  Filled 2017-05-04 (×2): qty 45

## 2017-05-04 MED ORDER — SODIUM CHLORIDE 0.9 % IV SOLN: 250.0000 mL | INTRAVENOUS | Status: DC | PRN

## 2017-05-04 MED ORDER — PANTOPRAZOLE SODIUM 40 MG IV SOLR
40.0000 mg | Freq: Every day | INTRAVENOUS | Status: DC
  Administered 2017-05-04 – 2017-05-07 (×4): 40 mg via INTRAVENOUS
  Filled 2017-05-04 (×5): qty 40

## 2017-05-04 MED ORDER — CEFTRIAXONE SODIUM 1 G IJ SOLR
1.0000 g | INTRAMUSCULAR | Status: DC
  Administered 2017-05-04 – 2017-05-09 (×5): 1 g via INTRAVENOUS
  Filled 2017-05-04 (×5): qty 10

## 2017-05-04 MED ORDER — IOPAMIDOL (ISOVUE-300) INJECTION 61%
INTRAVENOUS | Status: AC
  Administered 2017-05-04: 1 mL
  Filled 2017-05-04: qty 30

## 2017-05-04 MED ORDER — HYDRALAZINE HCL 20 MG/ML IJ SOLN
10.0000 mg | Freq: Once | INTRAMUSCULAR | Status: AC
  Administered 2017-05-04: 10 mg via INTRAVENOUS
  Filled 2017-05-04: qty 1

## 2017-05-04 MED ORDER — SODIUM CHLORIDE 0.9 % IV SOLN
INTRAVENOUS | Status: DC
  Administered 2017-05-04: 20:00:00 via INTRAVENOUS
  Administered 2017-05-05: 75 mL/h via INTRAVENOUS

## 2017-05-04 MED ORDER — ROCURONIUM BROMIDE 50 MG/5ML IV SOLN
INTRAVENOUS | Status: AC | PRN
  Administered 2017-05-04: 100 mg via INTRAVENOUS

## 2017-05-04 MED ORDER — DARUN-COBIC-EMTRICIT-TENOFAF 800-150-200-10 MG PO TABS: 1.0000 | ORAL_TABLET | Freq: Every day | ORAL | Status: DC

## 2017-05-04 MED ORDER — HEPARIN SODIUM (PORCINE) 5000 UNIT/ML IJ SOLN
5000.0000 [IU] | Freq: Three times a day (TID) | INTRAMUSCULAR | Status: DC
  Administered 2017-05-04: 5000 [IU] via SUBCUTANEOUS
  Filled 2017-05-04: qty 1

## 2017-05-04 MED ORDER — NALOXONE HCL 2 MG/2ML IJ SOSY
1.0000 mg/h | PREFILLED_SYRINGE | INTRAVENOUS | Status: DC
  Filled 2017-05-04: qty 4

## 2017-05-04 MED ORDER — ETOMIDATE 2 MG/ML IV SOLN
INTRAVENOUS | Status: AC | PRN
  Administered 2017-05-04: 20 mg via INTRAVENOUS

## 2017-05-04 MED ORDER — ATROPINE SULFATE 1 MG/ML IJ SOLN
INTRAMUSCULAR | Status: AC | PRN
  Administered 2017-05-04: 1 mg via INTRAVENOUS

## 2017-05-04 MED ORDER — FENTANYL CITRATE (PF) 100 MCG/2ML IJ SOLN: 100.0000 ug | INTRAMUSCULAR | Status: DC | PRN

## 2017-05-04 MED ORDER — INSULIN ASPART 100 UNIT/ML ~~LOC~~ SOLN: 2.0000 [IU] | SUBCUTANEOUS | Status: DC

## 2017-05-04 MED ORDER — SODIUM CHLORIDE 0.9 % IV BOLUS (SEPSIS)
1000.0000 mL | Freq: Once | INTRAVENOUS | Status: AC
  Administered 2017-05-04: 1000 mL via INTRAVENOUS

## 2017-05-04 MED ORDER — NALOXONE HCL 0.4 MG/ML IJ SOLN
INTRAMUSCULAR | Status: AC
  Administered 2017-05-04: 0.4 mg
  Filled 2017-05-04: qty 1

## 2017-05-04 NOTE — ED Triage Notes (Signed)
Per family, pt last saw pt at 100 this morning after taking her to the Methadone clinic. Family reports he came home to her this afternoon and she was unresponsive. Family brought her in the back of his car unresponsive. Pt was responsive to painful stimuli upon arrival.

## 2017-05-04 NOTE — ED Notes (Signed)
Brief period of hypotension and decorticate posturing lasting approx 1-2 minutes.

## 2017-05-04 NOTE — ED Notes (Signed)
CCM at bedside 

## 2017-05-04 NOTE — Progress Notes (Signed)
Pharmacy Antibiotic Note  Ruth Stephenson is a 66 y.o. female to begin Ceftriaxone dosing for UTI.  Noted hx E coli UTI.   Blood, urine and respiratory cultures ordered.  Plan:  Ceftriaxone 1 gm IV q24hrs.  No adjustment will be needed for renal function.  Will follow up culture data.  Height: 5\' 2"  (157.5 cm) Weight: 166 lb 10.7 oz (75.6 kg) IBW/kg (Calculated) : 50.1  Temp (24hrs), Avg:96 F (35.6 C), Min:95.5 F (35.3 C), Max:97.7 F (36.5 C)  Recent Labs  Lab 05/04/17 1730 05/04/17 1750 05/04/17 1902 05/04/17 1942  WBC 2.3*  --   --   --   CREATININE 0.98 0.90  --   --   LATICACIDVEN  --   --  1.21 0.86    Estimated Creatinine Clearance: 59.3 mL/min (by C-G formula based on SCr of 0.9 mg/dL).    No Known Allergies  Antimicrobials this admission:  Ceftriaxone 11/9>>  Dose adjustments this admission:  n/a  Microbiology results:   11/9 MRSA PCR -   11/9 tracheal aspirate   11/9 urine -   11/9 blood x 2 -  Thank you for allowing pharmacy to be a part of this patient's care.  [place], [place], [place] Pager: [phone] 05/04/2017 10:04 PM

## 2017-05-04 NOTE — Procedures (Signed)
Intubation Procedure Note Ruth Stephenson [id number] 08/12/1950  Procedure: Intubation Indications: Respiratory insufficiency  Procedure Details Consent: Unable to obtain consent because of emergent medical necessity. Time Out: Verified patient identification, verified procedure, site/side was marked, verified correct patient position, special equipment/implants available, medications/allergies/relevent history reviewed, required imaging and test results available.  Performed  Maximum sterile technique was used including cap, gloves, gown, hand hygiene and mask.  MAC and 3    Evaluation Hemodynamic Status: BP stable throughout; O2 sats: stable throughout Patient's Current Condition: stable Complications: No apparent complications Patient did tolerate procedure well. Chest X-ray ordered to verify placement.  CXR: pending.   Myrtie Neither 05/04/2017

## 2017-05-04 NOTE — Progress Notes (Addendum)
Patient transported to CT and back to TRA B. Vitals remained stable.

## 2017-05-04 NOTE — ED Notes (Addendum)
Hypotensive again, MAP 70. MAP up to 78 after slight trendelenburg. Awaiting CCM to return from upstairs.

## 2017-05-04 NOTE — H&P (Signed)
PULMONARY / CRITICAL CARE MEDICINE   Name: Ruth Stephenson MRN: [id number] DOB: 08/21/1950    ADMISSION DATE:  05/04/2017 CONSULTATION DATE:  05/04/2017  REFERRING MD:  Dr. Levonne Lapping   CHIEF COMPLAINT:  Unresponsive   HISTORY OF PRESENT ILLNESS:   66 year old female with PMH of heroin abuse, hep C, colon cancer s/p resection, and HIV. History of medical non-compliance.   Presents to ED on after being found unresponsive at home. Family member states that he took patient to methadone clinic at about 11 am and then dropped her off at home. When he returned to the house at 3-4 pm patient was found on the couch, face down unresponsive. Upon arrival to ED patient was intubated for airway protection. ABG 7.26/67.4/177. UDS positive for Benzo, Opiates, Cocaine. PCCM asked to admit.   Patient has another EMR with MRN [id number]   PAST MEDICAL HISTORY :  She  has a past medical history of Cancer ([place]).  PAST SURGICAL HISTORY: She  has no past surgical history on file.  No Known Allergies  No current facility-administered medications on file prior to encounter.    No current outpatient medications on file prior to encounter.    FAMILY HISTORY:  Her has no family status information on file.    SOCIAL HISTORY: She  reports that she has been smoking cigarettes.  She has been smoking about 0.50 packs per day. she has never used smokeless tobacco. She reports that she drinks alcohol. She reports that she uses drugs. Drugs: Cocaine and Marijuana.  REVIEW OF SYSTEMS:   Unable to review as patient is intubated/sedated  SUBJECTIVE:   VITAL SIGNS: BP (!) 144/91   Pulse 95   Temp 97.6 F (36.4 C) (Temporal)   Resp 20   Ht 5\' 2"  (1.575 m)   Wt 63.5 kg (140 lb)   SpO2 96%   BMI 25.61 kg/m   HEMODYNAMICS:    VENTILATOR SETTINGS: Vent Mode: PRVC FiO2 (%):  [70 %-100 %] 70 % Set Rate:  [16 bmp-20 bmp] 20 bmp Vt Set:  [420 mL] 420 mL PEEP:  [5 cmH20] 5 cmH20 Plateau Pressure:  [20  cmH20] 20 cmH20  INTAKE / OUTPUT: No intake/output data recorded.  PHYSICAL EXAMINATION: General:  Adult female, intubated  Neuro:  +Gag/cough, pupils intact, withdrawals to pain  HEENT:  ETT in place  Cardiovascular:  Loletha Grayer, no MRG  Lungs:  Clear breath sounds, no wheeze/crackles  Abdomen:  Distended, active bowel sounds  Musculoskeletal: swelling and darkening of lower extremities  Skin:  Warm, dry, intact   LABS:  BMET Recent Labs  Lab 05/04/17 1730 05/04/17 1750  NA 139 141  K 4.3 4.3  CL 105 103  CO2 28  --   BUN 7 9  CREATININE 0.98 0.90  GLUCOSE 117* 120*    Electrolytes Recent Labs  Lab 05/04/17 1730  CALCIUM 9.4    CBC Recent Labs  Lab 05/04/17 1730 05/04/17 1750  WBC 2.3*  --   HGB 14.8 16.3*  HCT 45.2 48.0*  PLT 74*  --     Coag's No results for input(s): APTT, INR in the last 168 hours.  Sepsis Markers Recent Labs  Lab 05/04/17 1902 05/04/17 1942  LATICACIDVEN 1.21 0.86    ABG Recent Labs  Lab 05/04/17 1824  PHART 7.266*  PCO2ART 67.4*  PO2ART 177.0*    Liver Enzymes Recent Labs  Lab 05/04/17 1730  AST 23  ALT 16  ALKPHOS 90  BILITOT 1.1  ALBUMIN 3.9    Cardiac Enzymes No results for input(s): TROPONINI, PROBNP in the last 168 hours.  Glucose Recent Labs  Lab 05/04/17 1712  GLUCAP 115*    Imaging Ct Head Wo Contrast  Result Date: 05/04/2017 CLINICAL DATA:  Altered level of consciousness EXAM: CT HEAD WITHOUT CONTRAST TECHNIQUE: Contiguous axial images were obtained from the base of the skull through the vertex without intravenous contrast. COMPARISON:  None. FINDINGS: Brain: No evidence of acute infarction, hemorrhage, hydrocephalus, extra-axial collection or mass lesion/mass effect. Vascular: No hyperdense vessel or unexpected calcification. Scattered calcifications at the carotid siphons Skull: Normal. Negative for fracture or focal lesion. Sinuses/Orbits: Mild mucosal thickening in the ethmoid and maxillary  sinuses. No acute orbital abnormality Other: None IMPRESSION: No CT evidence for acute intracranial abnormality. Electronically Signed   By: Donavan Foil M.D.   On: 05/04/2017 18:26   Dg Chest Port 1 View  Result Date: 05/04/2017 CLINICAL DATA:  Altered mental status EXAM: PORTABLE CHEST 1 VIEW COMPARISON:  02/22/2017 FINDINGS: Endotracheal tube tip is approximately 3.2 cm superior to the carina. Esophageal tube tip extends below the diaphragm but is not included. No pleural effusion. Heart size within normal limits. Aortic atherosclerosis. Vascular congestion. Mild diffuse interstitial opacities suggesting edema. Patchy atelectasis or infiltrate at the left base. IMPRESSION: 1. Support lines and tubes as above. 2. Vascular congestion with mild diffuse interstitial edema 3. Patchy atelectasis or infiltrate at the left base Electronically Signed   By: Donavan Foil M.D.   On: 05/04/2017 18:02     STUDIES:  CXR 11/9 > 1. Support lines and tubes as above. 2. Vascular congestion with mild diffuse interstitial edema 3. Patchy atelectasis or infiltrate at the left base CT Head 11/9 > No CT evidence for acute intracranial abnormality.  CULTURES: U/A 11/9 > +Bacteria  Sputum 11/9 >> Urine 11/9 >> Sputum 11/9 >>  ANTIBIOTICS: Rocephin 11/9 >>  SIGNIFICANT EVENTS:  11/9 > Presents to ED   LINES/TUBES: ETT 11/9 >>  DISCUSSION: 66 year old female with PMH HIV, Hep C, and heroin abuse. Presents to ED after being found unresponsive. UDS positive for cocaine. Intubated upon arrival.   ASSESSMENT / PLAN:  PULMONARY A: Acute Hypercarbic Respiratory Distress  -Carboxyhemoglobin 5.1 > believe this is due to hypercarbia P:   Vent Support  VAP bundle Trend CXR/ABG  Trend Carboxyhemoglobin in AM   CARDIOVASCULAR A:  Bradycardia > Concern for endocarditis given IV drug h/o  P:  Cardiac Monitoring  ECHO pending   RENAL A:   No issues  P:   Trend BMP  Replace electrolytes as indicated   NS @ 75 ml/hr   GASTROINTESTINAL A:   H/O Hep C  P:   NPO  PPI Trend Ammonia  Lactulose 30 mg BID   HEMATOLOGIC A:   No issues  P:  Trend CBC  Coag Panel Pending   INFECTIOUS A:   UTI  H/O E.Coli UTI, HIV  P:   Trend PCT Trend WBC and Fever Curve Follow Culture Data Start Rocephin given positive U/A and history of E.Coli UTI   Cd4 pending > if less than 200 will start bacterium and azithromycin  Will need ID consult in AM > patient on symtuza at home however not available   CT Chest/AP pending   ENDOCRINE A:   No issues    P:   Trend Glucose  Cortisol pending   NEUROLOGIC A:   Acute Metabolic Encephalopathy  H/O Heroin Abuse  UDS +Cocoine  P:   RASS goal: 0/-1 Monitor  PRN Fentanyl    FAMILY  - Updates: No family at bedside   - Inter-disciplinary family meet or Palliative Care meeting due by: 05/11/2017   CC Time: 15 minutes   Hayden Pedro, AGACNP-BC [place]  Pgr: [phone]  PCCM Pgr: [phone]

## 2017-05-04 NOTE — Progress Notes (Signed)
Upon arrival, patient gurgling and labored breathing noted. Suctioned patients mouth. Began to assist patient with bag-valve mask and nasal trumpet was measured from earlobe to nostril and inserted. Moderate amount of frank red blood noted after insertion. Suctioned mouth and nasal passage. Nasal trumpet removed due to bleeding. Suctioning continued as needed. Preparing for intubation. MD at bedside.

## 2017-05-04 NOTE — ED Notes (Signed)
Xray at the bedside.

## 2017-05-04 NOTE — ED Notes (Signed)
Phlebotomy at the bedside  

## 2017-05-04 NOTE — ED Provider Notes (Signed)
[facility] EMERGENCY DEPARTMENT Provider Note  CSN: [id number] Arrival date & time: 05/04/17  1706 History   Chief Complaint Chief Complaint  Patient presents with  . Altered Mental Status   HPI Ruth Stephenson is a 66 y.o. female with PMH of heroin abuse, hepatitis C and HIV who presented unresponsive. Family member states that he took the patient to her methadone clinic about 11 AM this morning and then dropped her off at home.  When he returned about 3-4 PM he found her unresponsive and brought her to the emergency department.  He denies that he is aware of any recent relapse with heroin use or other drugs.  The history is provided by a relative. The history is limited by the condition of the patient.   Past Medical History:  Diagnosis Date  . Cancer ([place])    There are no active problems to display for this patient.  No past surgical history on file.  OB History    No data available     Home Medications    Prior to Admission medications   Not on File   Family History No family history on file.  Social History Social History   Tobacco Use  . Smoking status: Current Every Day Smoker    Packs/day: 0.50    Types: Cigarettes  . Smokeless tobacco: Never Used  Substance Use Topics  . Alcohol use: Yes    Comment: Occasionally   . Drug use: Yes    Types: Cocaine, Marijuana    Comment: Heroin    Allergies   Patient has no known allergies.  Review of Systems Review of Systems  Unable to perform ROS: Mental status change   Physical Exam Updated Vital Signs BP (!) 175/112   Pulse 97   Temp 97.6 F (36.4 C) (Temporal)   Resp 20   Ht 5\' 2"  (1.575 m)   Wt 63.5 kg (140 lb)   SpO2 100%   BMI 25.61 kg/m   Physical Exam  Constitutional: She appears well-developed.  HENT:  Head: Normocephalic and atraumatic.  Eyes: Conjunctivae are normal.  Neck: Neck supple.  Cardiovascular: Normal rate and regular rhythm.  No murmur  heard. Pulmonary/Chest: Effort normal and breath sounds normal. No respiratory distress.  Abdominal: Soft. There is no tenderness.  Musculoskeletal: She exhibits no edema or deformity.  Neurological: She is unresponsive. GCS eye subscore is 1. GCS verbal subscore is 1. GCS motor subscore is 3.  Skin: Skin is warm and dry.  Nursing note and vitals reviewed.  ED Treatments / Results  Labs (all labs ordered are listed, but only abnormal results are displayed) Labs Reviewed  CBC WITH DIFFERENTIAL/PLATELET - Abnormal; Notable for the following components:      Result Value   WBC 2.3 (*)    MCV 102.3 (*)    Platelets 74 (*)    Lymphs Abs 0.5 (*)    All other components within normal limits  COMPREHENSIVE METABOLIC PANEL - Abnormal; Notable for the following components:   Glucose, Bld 117 (*)    Total Protein 8.3 (*)    GFR calc non Af Amer 59 (*)    All other components within normal limits  ACETAMINOPHEN LEVEL - Abnormal; Notable for the following components:   Acetaminophen (Tylenol), Serum <10 (*)    All other components within normal limits  CBG MONITORING, ED - Abnormal; Notable for the following components:   Glucose-Capillary 115 (*)    All other components within normal  limits  I-STAT CHEM 8, ED - Abnormal; Notable for the following components:   Glucose, Bld 120 (*)    Hemoglobin 16.3 (*)    HCT 48.0 (*)    All other components within normal limits  I-STAT ARTERIAL BLOOD GAS, ED - Abnormal; Notable for the following components:   pH, Arterial 7.266 (*)    pCO2 arterial 67.4 (*)    pO2, Arterial 177.0 (*)    Bicarbonate 30.9 (*)    TCO2 33 (*)    All other components within normal limits  CULTURE, BLOOD (ROUTINE X 2)  CULTURE, BLOOD (ROUTINE X 2)  ETHANOL  SALICYLATE LEVEL  URINALYSIS, ROUTINE W REFLEX MICROSCOPIC  RAPID URINE DRUG SCREEN, HOSP PERFORMED  COOXEMETRY PANEL  I-STAT TROPONIN, ED  I-STAT CG4 LACTIC ACID, ED   EKG  EKG  Interpretation  Date/Time:  Friday May 04 2017 17:25:30 EST Ventricular Rate:  46 PR Interval:    QRS Duration: 111 QT Interval:  434 QTC Calculation: 380 R Axis:   132 Text Interpretation:  Right and left arm electrode reversal, interpretation assumes no reversal Sinus or ectopic atrial bradycardia Atrial premature complex Probable lateral infarct, age indeterminate bradycardia different than previous Confirmed by Wandra Arthurs [phone]) on 05/04/2017 5:54:58 PM      Radiology Ct Head Wo Contrast  Result Date: 05/04/2017 CLINICAL DATA:  Altered level of consciousness EXAM: CT HEAD WITHOUT CONTRAST TECHNIQUE: Contiguous axial images were obtained from the base of the skull through the vertex without intravenous contrast. COMPARISON:  None. FINDINGS: Brain: No evidence of acute infarction, hemorrhage, hydrocephalus, extra-axial collection or mass lesion/mass effect. Vascular: No hyperdense vessel or unexpected calcification. Scattered calcifications at the carotid siphons Skull: Normal. Negative for fracture or focal lesion. Sinuses/Orbits: Mild mucosal thickening in the ethmoid and maxillary sinuses. No acute orbital abnormality Other: None IMPRESSION: No CT evidence for acute intracranial abnormality. Electronically Signed   By: Donavan Foil M.D.   On: 05/04/2017 18:26   Dg Chest Port 1 View  Result Date: 05/04/2017 CLINICAL DATA:  Altered mental status EXAM: PORTABLE CHEST 1 VIEW COMPARISON:  02/22/2017 FINDINGS: Endotracheal tube tip is approximately 3.2 cm superior to the carina. Esophageal tube tip extends below the diaphragm but is not included. No pleural effusion. Heart size within normal limits. Aortic atherosclerosis. Vascular congestion. Mild diffuse interstitial opacities suggesting edema. Patchy atelectasis or infiltrate at the left base. IMPRESSION: 1. Support lines and tubes as above. 2. Vascular congestion with mild diffuse interstitial edema 3. Patchy atelectasis or infiltrate  at the left base Electronically Signed   By: Donavan Foil M.D.   On: 05/04/2017 18:02   Procedures Procedure Name: Intubation Date/Time: 05/05/2017 12:13 AM Performed by: Fenton Foy, MD Pre-anesthesia Checklist: Emergency Drugs available, Suction available and Patient being monitored Oxygen Delivery Method: Ambu bag Preoxygenation: Pre-oxygenation with 100% oxygen Induction Type: IV induction, Rapid sequence and Cricoid Pressure applied Ventilation: Mask ventilation without difficulty Laryngoscope Size: Glidescope and 3 Grade View: Grade I Tube size: 7.5 mm Number of attempts: 1 Airway Equipment and Method: Rigid stylet Placement Confirmation: ETT inserted through vocal cords under direct vision Secured at: 23 cm Tube secured with: ETT holder Dental Injury: Teeth and Oropharynx as per pre-operative assessment  Future Recommendations: Recommend- induction with short-acting agent, and alternative techniques readily available      (including critical care time)  Medications Ordered in ED Medications  naloxone (NARCAN) 4 mg in dextrose 5 % 250 mL infusion (0 mg/hr Intravenous Hold 05/04/17 1750)  naloxone [facility]) 0.4 MG/ML injection (0.4 mg  Given 05/04/17 1724)  naloxone (NARCAN) 2 MG/2ML injection (2 mg  Given 05/04/17 1725)  sodium chloride 0.9 % bolus 1,000 mL (1,000 mLs Intravenous New Bag/Given 05/04/17 1750)  naloxone (NARCAN) 2 MG/2ML injection (1 mg  Given 05/04/17 1730)  atropine injection (1 mg Intravenous Given 05/04/17 1741)  etomidate (AMIDATE) injection (20 mg Intravenous Given 05/04/17 1743)  rocuronium (ZEMURON) injection (100 mg Intravenous Given 05/04/17 1743)  hydrALAZINE (APRESOLINE) injection 10 mg (10 mg Intravenous Given 05/04/17 1824)   Initial Impression / Assessment and Plan / ED Course  I have reviewed the triage vital signs and the nursing notes.  Pertinent labs & imaging results that were available during my care of the patient were reviewed by me and  considered in my medical decision making (see chart for details).  Pt is a 66 y.o. female with pertinent PMHX of HIV, Hep C, and polysubstance abuse who presented with altered mental status and unresponsiveness.   The patient arrived via POV, on arrival she was minimal responsive with ED staff  On my initial assessment: she was very somnolent, unable to arouse, GCS of 5, initially hemodynamically stable. Patient was given multiple doses of narcan with some short lived improvement in alertness, however never returned to baseline, despite narcan GCS only improved to 8 at best, then quickly declined. The patient then become bradycardic and hemodynamic unstable, with periods of apnea. At that time the decision was made to intubate the patient. Patient given atropine prior to intubation. Patient tolerated the procedure well without unexpected complications.   Lab studies and imaging were ordered   Lab studies remarkable for ABG with respiratory acidosis pH 7.266, pCO2 67.4, HCO3 30. CMP unremarkable, CBC with leukopenia 2.3 and thrombocytopenia, Ammonia 66 UDS positive for cocaine, opiates and benzodiazepines,    Imaging: CT head No CT evidence for acute intracranial abnormality. CXR: Patchy atelectasis or infiltrate at the left base  Exact etiology of patient altered mental status uncertain potentially combination of polysubstance abuse leading to respiratory failure in combination with hepatic encephalopathy with ammonia of 66  Other possible causes for the patient's symptoms that were considered include:  infectious causes (encephalopathy, meningitis), tumor/abscess, toxic causes (hypoglycemia, renal failure, hepatic failure, toxic ingestion), other neurologic disorders (seizure, CVA, TIA), psychiatric causes (conversion) and trauma ([facility]).   Labs and imaging reviewed by myself and considered in medical decision making if ordered.  Imaging interpreted by radiology.  Patient admitted to ICU in  critical condition.   Pt was discussed with my attending, Dr. Darl Householder  Final Clinical Impressions(s) / ED Diagnoses   Final diagnoses:  Altered mental status, unspecified altered mental status type  Hepatic encephalopathy (HCC)  Polysubstance abuse ([place])  Acute respiratory failure with hypoxia and hypercapnia (HCC)  Leukopenia, unspecified type  Thrombocytopenia ([place])  HIV disease [facility])   ED Discharge Orders    None       Fenton Foy, MD 05/05/17 0035    Drenda Freeze, MD 05/06/17 [phone]

## 2017-05-05 ENCOUNTER — Inpatient Hospital Stay (HOSPITAL_COMMUNITY): Payer: Medicare Other

## 2017-05-05 ENCOUNTER — Other Ambulatory Visit: Payer: Self-pay

## 2017-05-05 DIAGNOSIS — J9602 Acute respiratory failure with hypercapnia: Secondary | ICD-10-CM

## 2017-05-05 DIAGNOSIS — J9601 Acute respiratory failure with hypoxia: Secondary | ICD-10-CM

## 2017-05-05 DIAGNOSIS — F1910 Other psychoactive substance abuse, uncomplicated: Secondary | ICD-10-CM

## 2017-05-05 DIAGNOSIS — R4182 Altered mental status, unspecified: Secondary | ICD-10-CM

## 2017-05-05 DIAGNOSIS — I340 Nonrheumatic mitral (valve) insufficiency: Secondary | ICD-10-CM

## 2017-05-05 DIAGNOSIS — B20 Human immunodeficiency virus [HIV] disease: Secondary | ICD-10-CM

## 2017-05-05 LAB — PHOSPHORUS: Phosphorus: 2.7 mg/dL (ref 2.5–4.6)

## 2017-05-05 LAB — BASIC METABOLIC PANEL
ANION GAP: 5 (ref 5–15)
BUN: 7 mg/dL (ref 6–20)
CO2: 27 mmol/L (ref 22–32)
Calcium: 8.8 mg/dL — ABNORMAL LOW (ref 8.9–10.3)
Chloride: 106 mmol/L (ref 101–111)
Creatinine, Ser: 0.67 mg/dL (ref 0.44–1.00)
GFR calc Af Amer: >60 mL/min (ref >60)
GFR calc non Af Amer: >60 mL/min (ref >60)
GLUCOSE: 95 mg/dL (ref 65–99)
POTASSIUM: 3.8 mmol/L (ref 3.5–5.1)
Sodium: 138 mmol/L (ref 135–145)

## 2017-05-05 LAB — CBC
HEMATOCRIT: 40.3 % (ref 36.0–46.0)
HEMOGLOBIN: 13.4 g/dL (ref 12.0–15.0)
MCH: 33.4 pg (ref 26.0–34.0)
MCHC: 33.3 g/dL (ref 30.0–36.0)
MCV: 100.5 fL — AB (ref 78.0–100.0)
Platelets: 69 10*3/uL — ABNORMAL LOW (ref 150–400)
RBC: 4.01 MIL/uL (ref 3.87–5.11)
RDW: 13 % (ref 11.5–15.5)
WBC: 4.1 10*3/uL (ref 4.0–10.5)

## 2017-05-05 LAB — PROCALCITONIN: Procalcitonin: <0.1 ng/mL

## 2017-05-05 LAB — ECHOCARDIOGRAM COMPLETE
HEIGHTINCHES: 62 in
Weight: 2691.38 oz

## 2017-05-05 LAB — GLUCOSE, CAPILLARY
GLUCOSE-CAPILLARY: 101 mg/dL — AB (ref 65–99)
GLUCOSE-CAPILLARY: 64 mg/dL — AB (ref 65–99)
GLUCOSE-CAPILLARY: 72 mg/dL (ref 65–99)
GLUCOSE-CAPILLARY: 73 mg/dL (ref 65–99)
GLUCOSE-CAPILLARY: 76 mg/dL (ref 65–99)
Glucose-Capillary: 177 mg/dL — ABNORMAL HIGH (ref 65–99)
Glucose-Capillary: 68 mg/dL (ref 65–99)
Glucose-Capillary: 69 mg/dL (ref 65–99)
Glucose-Capillary: 99 mg/dL (ref 65–99)

## 2017-05-05 LAB — AMMONIA: Ammonia: 59 umol/L — ABNORMAL HIGH (ref 9–35)

## 2017-05-05 LAB — MRSA PCR SCREENING: MRSA by PCR: NEGATIVE

## 2017-05-05 LAB — MAGNESIUM: Magnesium: 1.6 mg/dL — ABNORMAL LOW (ref 1.7–2.4)

## 2017-05-05 LAB — CARBOXYHEMOGLOBIN - COOX: Carboxyhemoglobin: 1.6 % — ABNORMAL HIGH (ref 0.5–1.5)

## 2017-05-05 MED ORDER — THIAMINE HCL 100 MG/ML IJ SOLN
100.0000 mg | Freq: Every day | INTRAMUSCULAR | Status: DC
  Administered 2017-05-05 – 2017-05-07 (×3): 100 mg via INTRAVENOUS
  Filled 2017-05-05 (×4): qty 1
  Filled 2017-05-05: qty 2

## 2017-05-05 MED ORDER — FOLIC ACID 5 MG/ML IJ SOLN
1.0000 mg | Freq: Every day | INTRAMUSCULAR | Status: DC
  Administered 2017-05-05 – 2017-05-07 (×3): 1 mg via INTRAVENOUS
  Filled 2017-05-05 (×6): qty 0.2

## 2017-05-05 MED ORDER — MIDAZOLAM HCL 2 MG/2ML IJ SOLN
1.0000 mg | INTRAMUSCULAR | Status: DC | PRN
  Administered 2017-05-06 – 2017-05-07 (×4): 2 mg via INTRAVENOUS
  Filled 2017-05-05 (×4): qty 2

## 2017-05-05 MED ORDER — LACTULOSE ENEMA
300.0000 mL | Freq: Every day | ORAL | Status: DC
  Administered 2017-05-05 – 2017-05-07 (×3): 300 mL via RECTAL
  Filled 2017-05-05 (×4): qty 300

## 2017-05-05 MED ORDER — INSULIN ASPART 100 UNIT/ML ~~LOC~~ SOLN
0.0000 [IU] | SUBCUTANEOUS | Status: DC
  Administered 2017-05-07 – 2017-05-08 (×5): 1 [IU] via SUBCUTANEOUS

## 2017-05-05 MED ORDER — SODIUM CHLORIDE 0.9% FLUSH
10.0000 mL | INTRAVENOUS | Status: DC | PRN
  Administered 2017-05-09: 10 mL
  Filled 2017-05-05: qty 40

## 2017-05-05 MED ORDER — FENTANYL CITRATE (PF) 100 MCG/2ML IJ SOLN
25.0000 ug | INTRAMUSCULAR | Status: DC | PRN
  Administered 2017-05-06 – 2017-05-07 (×3): 50 ug via INTRAVENOUS
  Filled 2017-05-05 (×4): qty 2

## 2017-05-05 MED ORDER — ORAL CARE MOUTH RINSE
15.0000 mL | OROMUCOSAL | Status: DC
  Administered 2017-05-05 – 2017-05-07 (×20): 15 mL via OROMUCOSAL

## 2017-05-05 MED ORDER — MAGNESIUM SULFATE 2 GM/50ML IV SOLN
2.0000 g | Freq: Once | INTRAVENOUS | Status: AC
  Administered 2017-05-05: 2 g via INTRAVENOUS
  Filled 2017-05-05: qty 50

## 2017-05-05 MED ORDER — CHLORHEXIDINE GLUCONATE CLOTH 2 % EX PADS
6.0000 | MEDICATED_PAD | Freq: Every day | CUTANEOUS | Status: DC
  Administered 2017-05-07 – 2017-05-09 (×3): 6 via TOPICAL

## 2017-05-05 MED ORDER — IOPAMIDOL (ISOVUE-300) INJECTION 61%
INTRAVENOUS | Status: AC
  Administered 2017-05-05: 100 mL
  Filled 2017-05-05: qty 100

## 2017-05-05 MED ORDER — DEXTROSE 50 % IV SOLN
INTRAVENOUS | Status: AC
  Administered 2017-05-05: 50 mL
  Filled 2017-05-05: qty 50

## 2017-05-05 MED ORDER — ENOXAPARIN SODIUM 40 MG/0.4ML ~~LOC~~ SOLN
40.0000 mg | SUBCUTANEOUS | Status: DC
  Administered 2017-05-05 – 2017-05-08 (×4): 40 mg via SUBCUTANEOUS
  Filled 2017-05-05 (×5): qty 0.4

## 2017-05-05 MED ORDER — DEXTROSE 5 % IV SOLN
INTRAVENOUS | Status: DC
  Administered 2017-05-05: 21:00:00 via INTRAVENOUS

## 2017-05-05 MED ORDER — CHLORHEXIDINE GLUCONATE 0.12% ORAL RINSE (MEDLINE KIT)
15.0000 mL | Freq: Two times a day (BID) | OROMUCOSAL | Status: DC
  Administered 2017-05-05 – 2017-05-07 (×5): 15 mL via OROMUCOSAL

## 2017-05-05 NOTE — Progress Notes (Signed)
Initial Nutrition Assessment  DOCUMENTATION CODES:   Obesity unspecified  INTERVENTION:   -If unable to extubate, recommend:  Initiate TF via NGT with Vital High Protein at goal rate of 30 ml/h (720 ml per day) and Prostat 30 ml TID to provide 1020 kcals, 108 gm protein, 602 ml free water daily.  NUTRITION DIAGNOSIS:   Inadequate oral intake related to inability to eat as evidenced by NPO status.  GOAL:   Provide needs based on ASPEN/SCCM guidelines  MONITOR:   Vent status, Labs, Weight trends, Skin, I & O's  REASON FOR ASSESSMENT:   Ventilator    ASSESSMENT:   66 year old female with PMH HIV, Hep C, and heroin abuse. Presents to ED after being found unresponsive. UDS positive for cocaine. Intubated upon arrival.   Patient is currently intubated on ventilator support. NGT noted.  MV: 8.3 L/min Temp (24hrs), Avg:96.7 F (35.9 C), Min:95.5 F (35.3 C), Max:98.6 F (37 C)  UDS positive for cocaine, opiates, and benzodiazepines.   Per PCCM notes, no plans to start TF today- plan extubation within 24-48 hours.   Labs reviewed: CBGS: 69-101 (inpatinet orders for glycemic control are 0-9 units insulin aspart every 4 hours).   NUTRITION - FOCUSED PHYSICAL EXAM:    Most Recent Value  Orbital Region  Mild depletion  Upper Arm Region  No depletion  Thoracic and Lumbar Region  No depletion  Buccal Region  Unable to assess  Temple Region  No depletion  Clavicle Bone Region  Mild depletion  Clavicle and Acromion Bone Region  No depletion  Scapular Bone Region  No depletion  Dorsal Hand  No depletion  Patellar Region  No depletion  Anterior Thigh Region  No depletion  Posterior Calf Region  No depletion  Edema (RD Assessment)  Mild  Hair  Reviewed  Eyes  Reviewed  Mouth  Unable to assess  Skin  Reviewed  Nails  Reviewed       Diet Order:  Diet NPO time specified  EDUCATION NEEDS:   No education needs have been identified at this time  Skin:  Skin  Assessment: Reviewed RN Assessment  Last BM:  PTA  Height:   Ht Readings from Last 1 Encounters:  05/04/17 5\' 2"  (1.575 m)    Weight:   Wt Readings from Last 1 Encounters:  05/05/17 168 lb 3.4 oz (76.3 kg)    Ideal Body Weight:  50 kg  BMI:  Body mass index is 30.77 kg/m.  Estimated Nutritional Needs:   Kcal:  [phone]  Protein:  > 100 grams  Fluid:  > 1 L    Tyrell Brereton A. Jimmye Norman, RD, LDN, CDE Pager: [phone] After hours Pager: [phone]

## 2017-05-05 NOTE — Progress Notes (Signed)
[facility] Attending Note  ADMISSION DATE:  05/04/2017  REFERRING MD:  Dr. Levonne Lapping   CHIEF COMPLAINT:  Encephalopathic  Presenting HPI:  66 y.o. female with a history of heroin use, chronic hepatitis C, colon cancer status post resection, and HIV. Patient has a history of medical noncompliance. Patient was found unresponsive at home. Per family patient was taken to her methadone clinic around 11 AM and then dropped her off at home. When she returned around 3-4 PM patient was found on the couch, face down, and unresponsive. In the emergency department the patient was found to be in a state of hypercarbia with urine drug screen positive for benzodiazepines, opiates, and cocaine. She was subsequently intubated for airway protection.  Subjective:  No acute events since admission. Patient is having intermittent periods of agitation.  Review of Systems:  Unable to obtain given intubation & altered mental status.   Vent Mode: PRVC FiO2 (%):  [50 %-100 %] 50 % Set Rate:  [16 bmp-20 bmp] 20 bmp Vt Set:  [420 mL] 420 mL PEEP:  [5 cmH20] 5 cmH20 Plateau Pressure:  [16 cmH20-20 cmH20] 17 cmH20  Temp:  [95.5 F (35.3 C)-98.6 F (37 C)] 98.1 F (36.7 C) (11/10 0823) Pulse Rate:  [42-106] 54 (11/10 1000) Resp:  [12-26] 20 (11/10 1000) BP: (80-197)/(50-125) 103/65 (11/10 1000) SpO2:  [92 %-100 %] 99 % (11/10 1000) FiO2 (%):  [50 %-100 %] 50 % (11/10 0844) Weight:  [140 lb (63.5 kg)-168 lb 3.4 oz (76.3 kg)] 168 lb 3.4 oz (76.3 kg) (11/10 0324)  General:  No family at bedside. Intubated. No distress. Integument:  Warm & dry. No rash on exposed skin. HEENT:  Tacky mucus memebranes. No scleral icterus. Endotracheal tube in place. Neurological:  Pupils symmetric. No spontaneous movements. Musculoskeletal:  No joint effusion or erythema appreciated. Symmetric muscle bulk. Pulmonary:  Symmetric chest wall rise on ventilator. Clear breath sounds bilaterally. Cardiovascular:   Regular rhythm but slightly bradycardic. No appreciable JVD. Normal S1 & S2. Telemetry:  Sinus rhythm. Abdomen:  Soft. Nondistended. Normoactive bowel sounds.  LINES/TUBES: OETT 11/9 >>> Foley  >>> OGT >>> PIV  CBC Latest Ref Rng & Units 05/05/2017 05/04/2017 05/04/2017  WBC 4.0 - 10.5 K/uL 4.1 - 2.3(L)  Hemoglobin 12.0 - 15.0 g/dL 13.4 16.3(H) 14.8  Hematocrit 36.0 - 46.0 % 40.3 48.0(H) 45.2  Platelets 150 - 400 K/uL 69(L) - 74(L)   BMP Latest Ref Rng & Units 05/05/2017 05/04/2017 05/04/2017  Glucose 65 - 99 mg/dL 95 120(H) 117(H)  BUN 6 - 20 mg/dL '7 9 7  '$ Creatinine 0.44 - 1.00 mg/dL 0.67 0.90 0.98  Sodium 135 - 145 mmol/L 138 141 139  Potassium 3.5 - 5.1 mmol/L 3.8 4.3 4.3  Chloride 101 - 111 mmol/L 106 103 105  CO2 22 - 32 mmol/L 27 - 28  Calcium 8.9 - 10.3 mg/dL 8.8(L) - 9.4   Hepatic Function Latest Ref Rng & Units 05/04/2017  Total Protein 6.5 - 8.1 g/dL 8.3(H)  Albumin 3.5 - 5.0 g/dL 3.9  AST 15 - 41 U/L 23  ALT 14 - 54 U/L 16  Alk Phosphatase 38 - 126 U/L 90  Total Bilirubin 0.3 - 1.2 mg/dL 1.1    IMAGING/STUDIES: UDS 11/9:  Positive Benzos, Cocaine, & Opiates. CT HEAD W/O 11/9:  No CT evidence for acute intracranial abnormality. CT CHEST/ABDOMEN/PELVIS W/ 11/10: IMPRESSION: 1. Tiny pleural effusions. Small ground-glass foci in the right upper lobe are suspicious for small foci of  pneumonitis or infection. Partial consolidations within the posterior right upper lobe and posterior lower lobes may reflect multifocal pneumonia and or aspiration. 2. Mild to moderate emphysema. 3. Cirrhotic morphology of the liver. Hyperdense focus within the anterior left hepatic lobe ; when clinically feasible, evaluation with liver MRI is suggested, preferably as an outpatient when the patient is able to breath hold. PORT CXR 11/10:  Personally reviewed by me. No focal opacity appreciated. Film rotated left. Questionable elevation of left hemidiaphragm. Endotracheal tube in good position.  Enteric feeding tube coursing below diaphragm. TTE 11/10 >>>  MICROBIOLOGY: MRSA PCR 11/10 >>> Blood Cultures x2 11/9 >>> Urine Culture 11/9 >>> Tracheal Aspirate Culture 11/9 >>>  ANTIBIOTICS: Rocephin 11/9 >>>  SIGNIFICANT EVENTS: 11/09 - Admit after being found down at home  ASSESSMENT/PLAN:  66 y.o. female presenting with acute encephalopathy being found down. Known history of heroin use. Urine drug screen positive for cocaine.  1. Acute encephalopathy: Multifactorial with polysubstance abuse. Continuing to avoid sedatives. Thiamine and folic acid IV daily. Starting lactulose enema daily. 2. Hypomagnesemia: Replaced. Repeat magnesium level in a.m. 3. Possible aspiration: Tracheal aspirate culture pending. Continuing empiric Rocephin. 4. Thrombocytopenia: Likely due to underlying cirrhosis with chronic hepatitis C. Trending cell counts daily with CBC. 5. Polysubstance abuse: Monitoring for signs of withdrawal.  Prophylaxis:  SCDs & Protonix IV qhs.  Diet:  NPO. Holding on tube feedings anticipating extubation in 24-48 hours. Code Status:  Full Code. Disposition:  Remains in the ICU while on ventilator. Family Update:  No family at bedside during rounds.  I have personally spent a total of 32 minutes of critical care time today caring for the patient & reviewing the patient's electronic medical record.  Sonia Baller Ashok Cordia, M.D. [facility] Pager:  [phone] After 7pm or if no response, call [phone] 11:27 AM 05/05/17

## 2017-05-05 NOTE — Progress Notes (Signed)
Hypoglycemic Event  CBG: 64  Treatment: D50 IV 50 mL  Symptoms: None  Follow-up CBG: Time:2013 CBG Result:177  Possible Reasons for Event: Inadequate meal intake  Comments/MD notified: IV fluids changed/ Hayden Pedro NP    Felizardo Hoffmann

## 2017-05-05 NOTE — Progress Notes (Signed)
  Echocardiogram 2D Echocardiogram has been performed.  Ruth Stephenson 05/05/2017, 8:51 AM

## 2017-05-05 NOTE — Progress Notes (Signed)
  Date of Service  05/05/2017   HPI/Events  Continued Hypoglycemia   Interventions  Start D5 gtt @ 50 ml/hr    Hayden Pedro, AGACNP-BC [facility]  Pgr: [phone]  PCCM Pgr: [phone]

## 2017-05-06 LAB — CBC WITH DIFFERENTIAL/PLATELET
Basophils Absolute: 0 10*3/uL (ref 0.0–0.1)
Basophils Relative: 0 %
Eosinophils Absolute: 0 10*3/uL (ref 0.0–0.7)
Eosinophils Relative: 1 %
HEMATOCRIT: 44.9 % (ref 36.0–46.0)
Hemoglobin: 14.3 g/dL (ref 12.0–15.0)
LYMPHS PCT: 19 %
Lymphs Abs: 0.8 10*3/uL (ref 0.7–4.0)
MCH: 33.2 pg (ref 26.0–34.0)
MCHC: 31.8 g/dL (ref 30.0–36.0)
MCV: 104.2 fL — AB (ref 78.0–100.0)
MONO ABS: 0.5 10*3/uL (ref 0.1–1.0)
Monocytes Relative: 11 %
NEUTROS ABS: 3.1 10*3/uL (ref 1.7–7.7)
Neutrophils Relative %: 70 %
Platelets: 60 10*3/uL — ABNORMAL LOW (ref 150–400)
RBC: 4.31 MIL/uL (ref 3.87–5.11)
RDW: 13.6 % (ref 11.5–15.5)
WBC: 4.4 10*3/uL (ref 4.0–10.5)

## 2017-05-06 LAB — GLUCOSE, CAPILLARY
GLUCOSE-CAPILLARY: 76 mg/dL (ref 65–99)
GLUCOSE-CAPILLARY: 90 mg/dL (ref 65–99)
GLUCOSE-CAPILLARY: 111 mg/dL — AB (ref 65–99)
Glucose-Capillary: 74 mg/dL (ref 65–99)
Glucose-Capillary: 101 mg/dL — ABNORMAL HIGH (ref 65–99)

## 2017-05-06 LAB — RENAL FUNCTION PANEL
Albumin: 3.3 g/dL — ABNORMAL LOW (ref 3.5–5.0)
Anion gap: 5 (ref 5–15)
BUN: 11 mg/dL (ref 6–20)
CHLORIDE: 115 mmol/L — AB (ref 101–111)
CO2: 27 mmol/L (ref 22–32)
Calcium: 9.1 mg/dL (ref 8.9–10.3)
Creatinine, Ser: 0.71 mg/dL (ref 0.44–1.00)
Glucose, Bld: 85 mg/dL (ref 65–99)
POTASSIUM: 4.6 mmol/L (ref 3.5–5.1)
Phosphorus: 2.8 mg/dL (ref 2.5–4.6)
Sodium: 147 mmol/L — ABNORMAL HIGH (ref 135–145)

## 2017-05-06 LAB — URINE CULTURE: CULTURE: NO GROWTH

## 2017-05-06 LAB — MAGNESIUM: MAGNESIUM: 2.1 mg/dL (ref 1.7–2.4)

## 2017-05-06 LAB — HIV 1/2 AB DIFFERENTIATION
HIV 1 Ab: POSITIVE — AB
HIV 2 Ab: NEGATIVE

## 2017-05-06 LAB — PROCALCITONIN: Procalcitonin: <0.1 ng/mL

## 2017-05-06 LAB — HIV ANTIBODY (ROUTINE TESTING W REFLEX): HIV Screen 4th Generation wRfx: REACTIVE — AB

## 2017-05-06 MED ORDER — SODIUM CHLORIDE 0.9 % IV SOLN
INTRAVENOUS | Status: DC
  Administered 2017-05-06: 20:00:00 via INTRAVENOUS

## 2017-05-06 NOTE — Progress Notes (Signed)
[facility] Attending Note  ADMISSION DATE:  05/04/2017  REFERRING MD:  Dr. Levonne Lapping   CHIEF COMPLAINT:  Encephalopathic  Presenting HPI:  66 y.o. female with a history of heroin use, chronic hepatitis C, colon cancer status post resection, and HIV. Patient has a history of medical noncompliance. Patient was found unresponsive at home. Per family patient was taken to her methadone clinic around 11 AM and then dropped her off at home. When she returned around 3-4 PM patient was found on the couch, face down, and unresponsive. In the emergency department the patient was found to be in a state of hypercarbia with urine drug screen positive for benzodiazepines, opiates, and cocaine. She was subsequently intubated for airway protection.  Subjective:  No acute events overnight. People still not following commands consistently. More awake this morning.   Review of Systems:  Unable to obtain given intubation & altered mental status.   Vent Mode: CPAP;PSV FiO2 (%):  [40 %] 40 % Set Rate:  [20 bmp] 20 bmp Vt Set:  [420 mL] 420 mL PEEP:  [5 cmH20] 5 cmH20 Pressure Support:  [5 cmH20] 5 cmH20 Plateau Pressure:  [16 cmH20-20 cmH20] 17 cmH20  Temp:  [97.2 F (36.2 C)-98.4 F (36.9 C)] 97.6 F (36.4 C) (11/11 0749) Pulse Rate:  [45-67] 47 (11/11 0800) Resp:  [16-23] 20 (11/11 0800) BP: (84-152)/(54-117) 92/58 (11/11 0800) SpO2:  [96 %-100 %] 100 % (11/11 0800) FiO2 (%):  [40 %] 40 % (11/11 0812) Weight:  [169 lb 5 oz (76.8 kg)] 169 lb 5 oz (76.8 kg) (11/11 0400)  Gen.: No family at bedside. No acute distress. Integument: Warm. Dry. No rash. Neurological: Symmetric face. Not consistently following commands. Eyes open. Spontaneously moving all 4 extremities. Cardiovascular: Regular rate. No JVD. No edema. Pulmonary: Symmetric chest wall rise on ventilator. Clear to auscultation. Abdomen: Soft. Nondistended. Normal bowel sounds. HEENT:  Moist membranes. No scleral icterus.  Endotracheal tube in place.  LINES/TUBES: OETT 11/9 >>> Foley  >>> OGT >>> PIV  CBC Latest Ref Rng & Units 05/06/2017 05/05/2017 05/04/2017  WBC 4.0 - 10.5 K/uL 4.4 4.1 -  Hemoglobin 12.0 - 15.0 g/dL 14.3 13.4 16.3(H)  Hematocrit 36.0 - 46.0 % 44.9 40.3 48.0(H)  Platelets 150 - 400 K/uL 60(L) 69(L) -   BMP Latest Ref Rng & Units 05/06/2017 05/05/2017 05/04/2017  Glucose 65 - 99 mg/dL 85 95 120(H)  BUN 6 - 20 mg/dL _0 Creatinine 0.44 - 1.00 mg/dL 0.71 0.67 0.90  Sodium 135 - 145 mmol/L 147(H) 138 141  Potassium 3.5 - 5.1 mmol/L 4.6 3.8 4.3  Chloride 101 - 111 mmol/L 115(H) 106 103  CO2 22 - 32 mmol/L 27 27 -  Calcium 8.9 - 10.3 mg/dL 9.1 8.8(L) -   Hepatic Function Latest Ref Rng & Units 05/06/2017 05/04/2017  Total Protein 6.5 - 8.1 g/dL - 8.3(H)  Albumin 3.5 - 5.0 g/dL 3.3(L) 3.9  AST 15 - 41 U/L - 23  ALT 14 - 54 U/L - 16  Alk Phosphatase 38 - 126 U/L - 90  Total Bilirubin 0.3 - 1.2 mg/dL - 1.1    IMAGING/STUDIES: UDS 11/9:  Positive Benzos, Cocaine, & Opiates. CT HEAD W/O 11/9:  No CT evidence for acute intracranial abnormality. CT CHEST/ABDOMEN/PELVIS W/ 11/10: IMPRESSION: 1. Tiny pleural effusions. Small ground-glass foci in the right upper lobe are suspicious for small foci of pneumonitis or infection. Partial consolidations within the posterior right upper lobe and posterior lower  lobes may reflect multifocal pneumonia and or aspiration. 2. Mild to moderate emphysema. 3. Cirrhotic morphology of the liver. Hyperdense focus within the anterior left hepatic lobe ; when clinically feasible, evaluation with liver MRI is suggested, preferably as an outpatient when the patient is able to breath hold. PORT CXR 11/10:  Previously reviewed by me. No focal opacity appreciated. Film rotated left. Questionable elevation of left hemidiaphragm. Endotracheal tube in good position. Enteric feeding tube coursing below diaphragm. TTE 11/10:  LV with mild LVH & EF 60-65%. No regional  wall motion abnormalities. Unable to assess diastolic function. LA & RA normal in size. RV normal in size and function. No aortic regurgitation. Aortic root normal in size. Mild mitral regurgitation. No significant pulmonic regurgitation with poorly visualized valve. Trivial tricuspid regurgitation. No pericardial effusion.  MICROBIOLOGY: MRSA PCR 11/09:  Negative  Blood Cultures x2 11/9 >>> Urine Culture 11/9:  Negative  Tracheal Aspirate Culture 11/9 >>>  ANTIBIOTICS: Rocephin 11/9 >>>  SIGNIFICANT EVENTS: 11/09 - Admit after being found down at home  ASSESSMENT/PLAN:  66 y.o.  female with acute encephalopathy. Found down. Polysubstance abuse.   1. Acute encephalopathy: Slowly improving. Continuing to hold sedatives. Continuing thiamine and folic acid IV daily. Continuing rectal enema daily with lactulose.  2. Possible aspiration: Continuing empiric Rocephin. Awaiting finalization of culture results. 3. Hypomagnesemia: Resolved. 4. Thrombocytopenia: Stable. Likely due to liver cirrhosis with chronic hepatitis C. Continuing to trend cell count daily with CBC. 5. Polysubstance abuse: Monitored for signs and symptoms of withdrawal.   Prophylaxis:  SCDs & Protonix IV qhs.  Diet:  NPO. Holding on tube feedings for now. Code Status:  Full Code. Disposition:  Remains in the ICU while on ventilator. Family Update:  No family present during rounds today.  I have personally spent a total of 33 minutes of critical care time today caring for the patient & reviewing the patient's electronic medical record.  Sonia Baller Ashok Cordia, M.D. [facility] Pager:  [phone] After 7pm or if no response, call [phone] 9:51 AM 05/06/17

## 2017-05-07 ENCOUNTER — Other Ambulatory Visit: Payer: Self-pay

## 2017-05-07 LAB — GLUCOSE, CAPILLARY
GLUCOSE-CAPILLARY: 124 mg/dL — AB (ref 65–99)
GLUCOSE-CAPILLARY: 131 mg/dL — AB (ref 65–99)
GLUCOSE-CAPILLARY: 134 mg/dL — AB (ref 65–99)
GLUCOSE-CAPILLARY: 141 mg/dL — AB (ref 65–99)
GLUCOSE-CAPILLARY: 78 mg/dL (ref 65–99)
GLUCOSE-CAPILLARY: 78 mg/dL (ref 65–99)
Glucose-Capillary: 119 mg/dL — ABNORMAL HIGH (ref 65–99)

## 2017-05-07 LAB — RENAL FUNCTION PANEL
Albumin: 2.9 g/dL — ABNORMAL LOW (ref 3.5–5.0)
Anion gap: 7 (ref 5–15)
BUN: 10 mg/dL (ref 6–20)
CHLORIDE: 113 mmol/L — AB (ref 101–111)
CO2: 24 mmol/L (ref 22–32)
CREATININE: 0.68 mg/dL (ref 0.44–1.00)
Calcium: 8.9 mg/dL (ref 8.9–10.3)
GFR calc Af Amer: >60 mL/min (ref >60)
Glucose, Bld: 92 mg/dL (ref 65–99)
PHOSPHORUS: 2.5 mg/dL (ref 2.5–4.6)
Potassium: 4 mmol/L (ref 3.5–5.1)
Sodium: 144 mmol/L (ref 135–145)

## 2017-05-07 LAB — CBC WITH DIFFERENTIAL/PLATELET
BASOS ABS: 0 10*3/uL (ref 0.0–0.1)
Basophils Relative: 0 %
Eosinophils Absolute: 0 10*3/uL (ref 0.0–0.7)
Eosinophils Relative: 1 %
HCT: 40 % (ref 36.0–46.0)
Hemoglobin: 12.9 g/dL (ref 12.0–15.0)
LYMPHS ABS: 1.2 10*3/uL (ref 0.7–4.0)
Lymphocytes Relative: 27 %
MCH: 33.2 pg (ref 26.0–34.0)
MCHC: 32.3 g/dL (ref 30.0–36.0)
MCV: 102.8 fL — AB (ref 78.0–100.0)
MONOS PCT: 12 %
Monocytes Absolute: 0.6 10*3/uL (ref 0.1–1.0)
NEUTROS ABS: 2.8 10*3/uL (ref 1.7–7.7)
Neutrophils Relative %: 60 %
PLATELETS: 97 10*3/uL — AB (ref 150–400)
RBC: 3.89 MIL/uL (ref 3.87–5.11)
RDW: 13.4 % (ref 11.5–15.5)
WBC: 4.6 10*3/uL (ref 4.0–10.5)

## 2017-05-07 LAB — CD4/CD8 (T-HELPER/T-SUPPRESSOR CELL)
CD4 ABSOLUTE: 160 /uL — AB (ref 500–1900)
CD4%: 25 % — ABNORMAL LOW (ref 30.0–60.0)
CD8 T Cell Abs: 270 /uL (ref 230–1000)
CD8TOX: 44 % — AB (ref 15.0–40.0)
Ratio: 0.6 — ABNORMAL LOW (ref 1.0–3.0)
TOTAL LYMPHOCYTE COUNT: 620 /uL — AB (ref 1000–4000)

## 2017-05-07 LAB — MAGNESIUM: MAGNESIUM: 1.6 mg/dL — AB (ref 1.7–2.4)

## 2017-05-07 LAB — HIV-1 RNA QUANT-NO REFLEX-BLD
HIV 1 RNA Quant: 200 copies/mL
LOG10 HIV-1 RNA: 2.301 log10copy/mL

## 2017-05-07 LAB — CULTURE, RESPIRATORY W GRAM STAIN

## 2017-05-07 LAB — CULTURE, RESPIRATORY

## 2017-05-07 MED ORDER — DEXTROSE-NACL 5-0.45 % IV SOLN
INTRAVENOUS | Status: DC
  Administered 2017-05-07: 12:00:00 via INTRAVENOUS

## 2017-05-07 MED ORDER — HYDROMORPHONE HCL 1 MG/ML IJ SOLN
1.0000 mg | INTRAMUSCULAR | Status: DC | PRN
  Administered 2017-05-07 – 2017-05-08 (×2): 1 mg via INTRAVENOUS
  Filled 2017-05-07 (×2): qty 1

## 2017-05-07 MED ORDER — HYDRALAZINE HCL 20 MG/ML IJ SOLN
10.0000 mg | INTRAMUSCULAR | Status: DC | PRN
  Administered 2017-05-07 – 2017-05-08 (×3): 10 mg via INTRAVENOUS
  Filled 2017-05-07 (×3): qty 1

## 2017-05-07 MED ORDER — WHITE PETROLATUM EX OINT
TOPICAL_OINTMENT | CUTANEOUS | Status: AC
  Administered 2017-05-07: 20:00:00
  Filled 2017-05-07: qty 28.35

## 2017-05-07 MED ORDER — CHLORHEXIDINE GLUCONATE 0.12% ORAL RINSE (MEDLINE KIT): 15.0000 mL | Freq: Two times a day (BID) | OROMUCOSAL | Status: DC

## 2017-05-07 MED ORDER — IPRATROPIUM-ALBUTEROL 0.5-2.5 (3) MG/3ML IN SOLN
3.0000 mL | Freq: Four times a day (QID) | RESPIRATORY_TRACT | Status: DC
  Administered 2017-05-07 – 2017-05-08 (×3): 3 mL via RESPIRATORY_TRACT
  Filled 2017-05-07 (×3): qty 3

## 2017-05-07 MED ORDER — MAGNESIUM SULFATE 2 GM/50ML IV SOLN
2.0000 g | Freq: Once | INTRAVENOUS | Status: AC
  Administered 2017-05-07: 2 g via INTRAVENOUS
  Filled 2017-05-07 (×2): qty 50

## 2017-05-07 MED ORDER — ORAL CARE MOUTH RINSE
15.0000 mL | Freq: Four times a day (QID) | OROMUCOSAL | Status: DC
  Administered 2017-05-07 (×2): 15 mL via OROMUCOSAL

## 2017-05-07 NOTE — Progress Notes (Addendum)
Harle Battiest (grandaughter) [phone] & Jerilee Hoh (daughter), would like a call from the physician on 13 Nov in regards to questions they would like to ask about the pt.

## 2017-05-07 NOTE — Progress Notes (Signed)
PULMONARY / CRITICAL CARE MEDICINE   Name: Ruth Stephenson MRN: [id number] DOB: [birth year]    ADMISSION DATE:  05/04/2017  REFERRING MD:  Dr. Levonne Lapping  CHIEF COMPLAINT:  Altered mental status  HISTORY OF PRESENT ILLNESS:   66 yo female smoker was found unresponsive at home after visiting methadone clinic.  She required intubation.  UDS positive for benzo's, opiates, and cocaine.  PMHx of colon cancer, Hep C, HIV, cirrhosis, emphysema.  SUBJECTIVE:  More awake.  Tolerating SBT.  Increased respiratory secretions.  VITAL SIGNS: BP (!) 168/75   Pulse (!) 52   Temp 98.5 F (36.9 C) (Oral)   Resp 17   Ht 5\' 2"  (1.575 m)   Wt 165 lb 12.6 oz (75.2 kg)   SpO2 100%   BMI 30.32 kg/m   VENTILATOR SETTINGS: Vent Mode: PSV;CPAP FiO2 (%):  [30 %] 30 % Set Rate:  [20 bmp] 20 bmp Vt Set:  [420 mL] 420 mL PEEP:  [5 cmH20] 5 cmH20 Pressure Support:  [5 cmH20] 5 cmH20 Plateau Pressure:  [14 cmH20-18 cmH20] 17 cmH20  INTAKE / OUTPUT: I/O last 3 completed shifts: In: 3119.5 [I.V.:2019.5; Other:910; NG/GT:90; IV Piggyback:100] Out: 2680 [Urine:880; Emesis/NG output:500; WLNLG:9211]  PHYSICAL EXAMINATION:  General - alert Eyes - pupils reactive ENT - ETT in place Cardiac - regular, no murmur Chest - b/l crackles Abd - soft, non tender Ext - no edema Skin - no rashes Neuro - normal strength   LABS:  BMET Recent Labs  Lab 05/05/17 0307 05/06/17 0550 05/07/17 0241  NA 138 147* 144  K 3.8 4.6 4.0  CL 106 115* 113*  CO2 27 27 24   BUN 7 11 10   CREATININE 0.67 0.71 0.68  GLUCOSE 95 85 92    Electrolytes Recent Labs  Lab 05/05/17 0307 05/06/17 0550 05/07/17 0241  CALCIUM 8.8* 9.1 8.9  MG 1.6* 2.1 1.6*  PHOS 2.7 2.8 2.5    CBC Recent Labs  Lab 05/05/17 0307 05/06/17 0550 05/07/17 0241  WBC 4.1 4.4 4.6  HGB 13.4 14.3 12.9  HCT 40.3 44.9 40.0  PLT 69* 60* 97*    Coag's Recent Labs  Lab 05/04/17 2142  APTT 29  INR 1.23    Sepsis Markers Recent Labs   Lab 05/04/17 1902 05/04/17 1942 05/05/17 0307 05/06/17 0550  LATICACIDVEN 1.21 0.86  --   --   PROCALCITON  --   --  <0.10 <0.10    ABG Recent Labs  Lab 05/04/17 1824 05/04/17 2140  PHART 7.266* 7.406  PCO2ART 67.4* 44.7  PO2ART 177.0* 119.0*    Liver Enzymes Recent Labs  Lab 05/04/17 1730 05/06/17 0550 05/07/17 0241  AST 23  --   --   ALT 16  --   --   ALKPHOS 90  --   --   BILITOT 1.1  --   --   ALBUMIN 3.9 3.3* 2.9*    Cardiac Enzymes No results for input(s): TROPONINI, PROBNP in the last 168 hours.  Glucose Recent Labs  Lab 05/06/17 1110 05/06/17 1624 05/06/17 1945 05/07/17 0010 05/07/17 0354 05/07/17 0744  GLUCAP 90 111* 101* 78 78 141*    Imaging No results found.   STUDIES:  CT head 11/09 >> no acute findings CT chest/abd/pelvis 11/10 >> GGO RUL, partial consolidation RUL and RLL, mild/mod emphysema, cirrhosis TTE 11/10 >> EF 60 to 65%, mild LVH, mild MR  CULTURES: Urine 11/09 >> negative Blood 11/09 >>  Sputum 11/09 >> Klebsiella, resistant to bactrim and  ampicillin  ANTIBIOTICS: Rocephin 11/09 >>   SIGNIFICANT EVENTS: 11/09 Admit  LINES/TUBES: ETT 11/09 >> 11/12   DISCUSSION: 66 yo with altered mental status, aspiration pneumonia, and VDRF in setting of polysubstance abuse.  ASSESSMENT / PLAN:  Acute hypoxic/hypercapnic respiratory failure from aspiration pneumonia. Tobacco abuse with emphysema. - proceed with extubation 11/12 - day 4 of Abx - scheduled BDs - f/u CXR  Acute metabolic encephalopathy. Polysubstance abuse. - prn dilaudid after extubation - will eventually need to resume methadone  Hx of Hep C with cirrhosis and concern for hepatic encephalopathy with elevated ammonia. - continue lactulose, thiamine, folic acid  HIV 1 Ab positive. - will need ID assessment  Thrombocytopenia in setting of cirrhosis. - f/u CBC  Hyperglycemia. - SSI  DVT prophylaxis - lovenox SUP - protonix Nutrition - NPO Goals  of care - full code  CC time 32 minutes  Chesley Mires, MD [place] 05/07/2017, 10:46 AM Pager:  [phone] After 3pm call: [phone]

## 2017-05-07 NOTE — Procedures (Signed)
Extubation Procedure Note  Patient Details:   Name: Ruth Stephenson DOB: [birth year] MRN: [id number]   Airway Documentation:     Evaluation  O2 sats: stable throughout Complications: No apparent complications Patient did tolerate procedure well. Bilateral Breath Sounds: Diminished, Other (Comment)(coarse)   Yes   Patient extubated to 2lnc. Vital signs stable at this time. No complications. Patient tolerating well. RT will continue to monitor.  Mcneil Sober 05/07/2017, 10:51 AM

## 2017-05-07 NOTE — Progress Notes (Signed)
Pt is alert and oriented. Pt denies pain at this time. Pts family members at bedside. Will continue to monitor.

## 2017-05-07 NOTE — Progress Notes (Signed)
Informed CCM about tea colored and cloudy urine. Stated to watch closely. Bartholomew Crews, RN 05/07/2017 5:08 PM

## 2017-05-08 ENCOUNTER — Ambulatory Visit: Payer: Medicaid Other | Admitting: Infectious Diseases

## 2017-05-08 ENCOUNTER — Inpatient Hospital Stay (HOSPITAL_COMMUNITY): Payer: Medicare Other

## 2017-05-08 LAB — COMPREHENSIVE METABOLIC PANEL
ALBUMIN: 3 g/dL — AB (ref 3.5–5.0)
ALT: 13 U/L — ABNORMAL LOW (ref 14–54)
ANION GAP: 9 (ref 5–15)
AST: 25 U/L (ref 15–41)
Alkaline Phosphatase: 69 U/L (ref 38–126)
BILIRUBIN TOTAL: 1.9 mg/dL — AB (ref 0.3–1.2)
BUN: 7 mg/dL (ref 6–20)
CO2: 26 mmol/L (ref 22–32)
Calcium: 9 mg/dL (ref 8.9–10.3)
Chloride: 104 mmol/L (ref 101–111)
Creatinine, Ser: 0.62 mg/dL (ref 0.44–1.00)
GFR calc Af Amer: >60 mL/min (ref >60)
GFR calc non Af Amer: >60 mL/min (ref >60)
GLUCOSE: 132 mg/dL — AB (ref 65–99)
POTASSIUM: 3.4 mmol/L — AB (ref 3.5–5.1)
SODIUM: 139 mmol/L (ref 135–145)
Total Protein: 6.8 g/dL (ref 6.5–8.1)

## 2017-05-08 LAB — GLUCOSE, CAPILLARY
GLUCOSE-CAPILLARY: 124 mg/dL — AB (ref 65–99)
GLUCOSE-CAPILLARY: 128 mg/dL — AB (ref 65–99)
GLUCOSE-CAPILLARY: 126 mg/dL — AB (ref 65–99)
Glucose-Capillary: 127 mg/dL — ABNORMAL HIGH (ref 65–99)

## 2017-05-08 LAB — CBC
HEMATOCRIT: 43.1 % (ref 36.0–46.0)
HEMOGLOBIN: 14.4 g/dL (ref 12.0–15.0)
MCH: 33.9 pg (ref 26.0–34.0)
MCHC: 33.4 g/dL (ref 30.0–36.0)
MCV: 101.4 fL — ABNORMAL HIGH (ref 78.0–100.0)
Platelets: 65 10*3/uL — ABNORMAL LOW (ref 150–400)
RBC: 4.25 MIL/uL (ref 3.87–5.11)
RDW: 13.3 % (ref 11.5–15.5)
WBC: 6.2 10*3/uL (ref 4.0–10.5)

## 2017-05-08 LAB — MAGNESIUM: Magnesium: 1.6 mg/dL — ABNORMAL LOW (ref 1.7–2.4)

## 2017-05-08 MED ORDER — EMTRICITABINE-TENOFOVIR AF 200-25 MG PO TABS
1.0000 | ORAL_TABLET | Freq: Every day | ORAL | Status: DC
  Administered 2017-05-08 – 2017-05-09 (×2): 1 via ORAL
  Filled 2017-05-08 (×2): qty 1

## 2017-05-08 MED ORDER — METHADONE HCL 10 MG/ML PO CONC: 45.0000 mg | Freq: Every day | ORAL | Status: DC

## 2017-05-08 MED ORDER — IPRATROPIUM-ALBUTEROL 0.5-2.5 (3) MG/3ML IN SOLN: 3.0000 mL | Freq: Four times a day (QID) | RESPIRATORY_TRACT | Status: DC | PRN

## 2017-05-08 MED ORDER — METHADONE HCL 5 MG PO TABS: 45.0000 mg | ORAL_TABLET | Freq: Every day | ORAL | Status: DC

## 2017-05-08 MED ORDER — DARUNAVIR-COBICISTAT 800-150 MG PO TABS
1.0000 | ORAL_TABLET | Freq: Every day | ORAL | Status: DC
  Administered 2017-05-08 – 2017-05-09 (×2): 1 via ORAL
  Filled 2017-05-08 (×2): qty 1

## 2017-05-08 MED ORDER — FOLIC ACID 1 MG PO TABS
1.0000 mg | ORAL_TABLET | Freq: Every day | ORAL | Status: DC
  Administered 2017-05-08 – 2017-05-09 (×2): 1 mg via ORAL
  Filled 2017-05-08 (×2): qty 1

## 2017-05-08 MED ORDER — LACTULOSE 10 GM/15ML PO SOLN
30.0000 g | Freq: Every day | ORAL | Status: DC
  Administered 2017-05-08 – 2017-05-09 (×2): 30 g via ORAL
  Filled 2017-05-08 (×2): qty 45

## 2017-05-08 MED ORDER — MAGNESIUM SULFATE 4 GM/100ML IV SOLN
4.0000 g | Freq: Once | INTRAVENOUS | Status: AC
Start: 2017-05-08 — End: 2017-05-08
  Administered 2017-05-08: 4 g via INTRAVENOUS
  Filled 2017-05-08: qty 100

## 2017-05-08 MED ORDER — METHADONE HCL 10 MG PO TABS
45.0000 mg | ORAL_TABLET | Freq: Every day | ORAL | Status: DC
  Administered 2017-05-08 – 2017-05-09 (×2): 45 mg via ORAL
  Filled 2017-05-08 (×2): qty 5

## 2017-05-08 MED ORDER — DARUN-COBIC-EMTRICIT-TENOFAF 800-150-200-10 MG PO TABS
1.0000 | ORAL_TABLET | Freq: Every day | ORAL | Status: DC
  Filled 2017-05-08: qty 1

## 2017-05-08 NOTE — Evaluation (Signed)
Physical Therapy Evaluation Patient Details Name: Ruth Stephenson MRN: [id number] DOB: 1950/10/15 Today's Date: 05/08/2017   History of Present Illness  66 yo female smoker was found unresponsive at home after visiting methadone clinic.  She required intubation.  UDS positive for benzo's, opiates, and cocaine.  PMHx of colon cancer, Hep C, HIV, cirrhosis, emphysema.  Clinical Impression  Pt admitted with above. Pt functioning at [place] level however anticipate pt to progress quickly and reach supervision level for safe d/c home. Pt may progress well enough and no longer need HHPT. Acute PT to follow and reassess d/c recs.    Follow Up Recommendations Home health PT;Supervision/Assistance - 24 hour    Equipment Recommendations  Rolling walker with 5" wheels;3in1 (PT)    Recommendations for Other Services       Precautions / Restrictions Precautions Precautions: Fall Restrictions Weight Bearing Restrictions: No      Mobility  Bed Mobility Overal bed mobility: Needs Assistance Bed Mobility: Supine to Sit     Supine to sit: Min assist     General bed mobility comments: HOB elevated, minA to scoot to EOB due to air mattress, pt did use bed rail, increased time  Transfers Overall transfer level: Needs assistance Equipment used: None Transfers: Sit to/from Stand Sit to Stand: Min assist         General transfer comment: v/c's to push up from bed and not pull up on walker, minA to maintain stabilty during transfer of hands from bed to RW,  Ambulation/Gait Ambulation/Gait assistance: Min assist Ambulation Distance (Feet): 120 Feet Assistive device: Rolling walker (2 wheeled) Gait Pattern/deviations: Step-through pattern;Decreased stride length;Staggering left Gait velocity: decreased Gait velocity interpretation: Below normal speed for age/gender General Gait Details: pt mildly unsteady, v/c's for walker safety/management. no overt LOB however required RW for safe  ambulation this date, patient concurred.   Stairs            Wheelchair Mobility    Modified Rankin (Stroke Patients Only)       Balance Overall balance assessment: Needs assistance Sitting-balance support: No upper extremity supported;Feet supported Sitting balance-Leahy Scale: Fair     Standing balance support: No upper extremity supported Standing balance-Leahy Scale: Fair Standing balance comment: requires RW for safe ambulation                             Pertinent Vitals/Pain Pain Assessment: Faces Faces Pain Scale: Hurts little more Pain Location: buttocks and chest    Home Living Family/patient expects to be discharged to:: Private residence Living Arrangements: Spouse/significant other Available Help at Discharge: Family;Available 24 hours/day Type of Home: Apartment Home Access: Stairs to enter(2nd floor apt) Entrance Stairs-Rails: Right;Left;Can reach both Entrance Stairs-Number of Steps: 10 Home Layout: One level Home Equipment: None      Prior Function Level of Independence: Independent               Hand Dominance   Dominant Hand: Right    Extremity/Trunk Assessment   Upper Extremity Assessment Upper Extremity Assessment: Generalized weakness    Lower Extremity Assessment Lower Extremity Assessment: Generalized weakness    Cervical / Trunk Assessment Cervical / Trunk Assessment: Normal  Communication   Communication: No difficulties  Cognition Arousal/Alertness: Awake/alert Behavior During Therapy: WFL for tasks assessed/performed Overall Cognitive Status: Within Functional Limits for tasks assessed  General Comments      Exercises     Assessment/Plan    PT Assessment Patient needs continued PT services  PT Problem List Decreased strength       PT Treatment Interventions DME instruction;Gait training;Stair training;Functional mobility  training;Therapeutic activities;Therapeutic exercise;Balance training    PT Goals (Current goals can be found in the Care Plan section)  Acute Rehab PT Goals Patient Stated Goal: get better PT Goal Formulation: With patient Time For Goal Achievement: 05/15/17 Potential to Achieve Goals: Good    Frequency Min 3X/week   Barriers to discharge        Co-evaluation               AM-PAC PT "6 Clicks" Daily Activity  Outcome Measure Difficulty turning over in bed (including adjusting bedclothes, sheets and blankets)?: Unable Difficulty moving from lying on back to sitting on the side of the bed? : Unable Difficulty sitting down on and standing up from a chair with arms (e.g., wheelchair, bedside commode, etc,.)?: Unable Help needed moving to and from a bed to chair (including a wheelchair)?: A Little Help needed walking in hospital room?: A Little Help needed climbing 3-5 steps with a railing? : A Little 6 Click Score: 12    End of Session Equipment Utilized During Treatment: Gait belt Activity Tolerance: Patient tolerated treatment well Patient left: in chair;with call bell/phone within reach;with chair alarm set Nurse Communication: Mobility status PT Visit Diagnosis: Unsteadiness on feet (R26.81)    Time: 8841-6606 PT Time Calculation (min) (ACUTE ONLY): 27 min   Charges:   PT Evaluation $PT Eval Moderate Complexity: 1 Mod PT Treatments $Gait Training: 8-22 mins   PT G Codes:        Kittie Plater, PT, DPT Pager #: [phone] Office #: [phone]   [place] 05/08/2017, 2:35 PM

## 2017-05-08 NOTE — Progress Notes (Signed)
PULMONARY / CRITICAL CARE MEDICINE   Name: Ruth Stephenson MRN: [id number] DOB: 06-Jan-1951    ADMISSION DATE:  05/04/2017  REFERRING MD:  Dr. Levonne Lapping  CHIEF COMPLAINT:  Altered mental status  HISTORY OF PRESENT ILLNESS:   66 yo female smoker was found unresponsive at home after visiting methadone clinic.  She required intubation.  UDS positive for benzo's, opiates, and cocaine.  PMHx of colon cancer, Hep C, HIV, cirrhosis, emphysema.  SUBJECTIVE:  Wants her methadone.  VITAL SIGNS: BP (!) 153/70   Pulse (!) 56   Temp 99 F (37.2 C) (Oral)   Resp (!) 24   Ht 5\' 2"  (1.575 m)   Wt 168 lb 10.4 oz (76.5 kg)   SpO2 93%   BMI 30.85 kg/m   INTAKE / OUTPUT: I/O last 3 completed shifts: In: 2920.3 [I.V.:1790.3; Other:30; NG/GT:950; IV Piggyback:150] Out: 2320 [Urine:1270; Emesis/NG output:150; Stool:900]  PHYSICAL EXAMINATION:  General - pleasant Eyes - pupils reactive ENT - no sinus tenderness, no oral exudate, no LAN Cardiac - regular, no murmur Chest - no wheeze, rales Abd - soft, non tender Ext - no edema Skin - no rashes Neuro - normal strength Psych - normal mood  LABS:  BMET Recent Labs  Lab 05/06/17 0550 05/07/17 0241 05/08/17 0618  NA 147* 144 139  K 4.6 4.0 3.4*  CL 115* 113* 104  CO2 27 24 26   BUN 11 10 7   CREATININE 0.71 0.68 0.62  GLUCOSE 85 92 132*    Electrolytes Recent Labs  Lab 05/05/17 0307 05/06/17 0550 05/07/17 0241 05/08/17 0618  CALCIUM 8.8* 9.1 8.9 9.0  MG 1.6* 2.1 1.6* 1.6*  PHOS 2.7 2.8 2.5  --     CBC Recent Labs  Lab 05/06/17 0550 05/07/17 0241 05/08/17 0307  WBC 4.4 4.6 6.2  HGB 14.3 12.9 14.4  HCT 44.9 40.0 43.1  PLT 60* 97* 65*    Coag's Recent Labs  Lab 05/04/17 2142  APTT 29  INR 1.23    Sepsis Markers Recent Labs  Lab 05/04/17 1902 05/04/17 1942 05/05/17 0307 05/06/17 0550  LATICACIDVEN 1.21 0.86  --   --   PROCALCITON  --   --  <0.10 <0.10    ABG Recent Labs  Lab 05/04/17 1824  05/04/17 2140  PHART 7.266* 7.406  PCO2ART 67.4* 44.7  PO2ART 177.0* 119.0*    Liver Enzymes Recent Labs  Lab 05/04/17 1730 05/06/17 0550 05/07/17 0241 05/08/17 0618  AST 23  --   --  25  ALT 16  --   --  13*  ALKPHOS 90  --   --  69  BILITOT 1.1  --   --  1.9*  ALBUMIN 3.9 3.3* 2.9* 3.0*    Cardiac Enzymes No results for input(s): TROPONINI, PROBNP in the last 168 hours.  Glucose Recent Labs  Lab 05/07/17 1148 05/07/17 1538 05/07/17 1952 05/07/17 2347 05/08/17 0348 05/08/17 0747  GLUCAP 124* 131* 119* 134* 124* 127*    Imaging Dg Chest Port 1 View  Result Date: 05/08/2017 CLINICAL DATA:  Aspiration pneumonia . EXAM: PORTABLE CHEST 1 VIEW COMPARISON:  05/05/2017. FINDINGS: Interim extubation and removal of NG tube. Mediastinum and hilar structures normal. Heart size normal. Bibasilar atelectasis. No pleural effusion or pneumothorax . Biapical pleural thickening again noted consistent scarring . IMPRESSION: 1.  Interim extubation and removal of NG tube. 2. Mild bibasilar atelectasis . Electronically Signed   By: Marcello Moores  Register   On: 05/08/2017 07:12  STUDIES:  CT head 11/09 >> no acute findings CT chest/abd/pelvis 11/10 >> GGO RUL, partial consolidation RUL and RLL, mild/mod emphysema, cirrhosis TTE 11/10 >> EF 60 to 65%, mild LVH, mild MR  CULTURES: Urine 11/09 >> negative Blood 11/09 >>  Sputum 11/09 >> Klebsiella, resistant to bactrim and ampicillin  ANTIBIOTICS: Rocephin 11/09 >>   SIGNIFICANT EVENTS: 11/09 Admit 11/13 transfer to floor bed  LINES/TUBES: ETT 11/09 >> 11/12   DISCUSSION: 66 yo with altered mental status, aspiration pneumonia, and VDRF in setting of polysubstance abuse.  ASSESSMENT / PLAN:  Acute hypoxic/hypercapnic respiratory failure from aspiration pneumonia. Tobacco abuse with emphysema. - day 5/7 of Abx - scheduled BDs - f/u CXR prn  Acute metabolic encephalopathy >> much improved. Polysubstance abuse. - resume  methadone 11/13  Hx of Hep C with cirrhosis and concern for hepatic encephalopathy with elevated ammonia. - change lactulose to po - continue thiamine, folic acid  HIV 1 Ab positive. - she has been followed by Dr. Johnnye Sima in [place] clinic - resume symtuza  Thrombocytopenia in setting of cirrhosis. - f/u CBC  Hyperglycemia. - not needing insulin - d/c SSI and monitor blood sugar on BMET  Bradycardia at night. - monitor on telemetry  DVT prophylaxis - lovenox SUP - not indicated Nutrition - heart healthy diet Goals of care - full code  Transfer to telemetry bed 11/13 >> to [facility] 11/14 and PCCM off.  Please note that the patient has an additional medical record number >> [id number].  Chesley Mires, MD [facility] Pulmonary/Critical Care 05/08/2017, 8:27 AM Pager:  [phone] After 3pm call: [phone]

## 2017-05-08 NOTE — Care Management Note (Signed)
Case Management Note  Patient Details  Name: Ruth Stephenson MRN: [id number] Date of Birth: [birth year]  Subjective/Objective:   Pt admitted after being found unresponsive in the home post visiting methadone clinic                 Action/Plan:  PTA from home.  Pt has positive drug screen.  CSW consulted for ongoing sub abuse.  CM will continue to follow for discharge needs   Expected Discharge Date:                  Expected Discharge Plan:     In-House Referral:  Clinical Social Work  Discharge planning Services  CM Consult  Post Acute Care Choice:    Choice offered to:     DME Arranged:    DME Agency:     HH Arranged:    [place] Agency:     Status of Service:     If discussed at [employer], dates discussed:    Additional Comments:  Maryclare Labrador, RN 05/08/2017, 2:30 PM

## 2017-05-09 DIAGNOSIS — D72819 Decreased white blood cell count, unspecified: Secondary | ICD-10-CM

## 2017-05-09 DIAGNOSIS — J690 Pneumonitis due to inhalation of food and vomit: Secondary | ICD-10-CM

## 2017-05-09 DIAGNOSIS — D696 Thrombocytopenia, unspecified: Secondary | ICD-10-CM

## 2017-05-09 DIAGNOSIS — K7290 Hepatic failure, unspecified without coma: Secondary | ICD-10-CM

## 2017-05-09 LAB — CULTURE, BLOOD (ROUTINE X 2)
Culture: NO GROWTH
Culture: NO GROWTH
SPECIAL REQUESTS: ADEQUATE
Special Requests: ADEQUATE

## 2017-05-09 LAB — BASIC METABOLIC PANEL
ANION GAP: 5 (ref 5–15)
BUN: 8 mg/dL (ref 6–20)
CO2: 29 mmol/L (ref 22–32)
Calcium: 8.6 mg/dL — ABNORMAL LOW (ref 8.9–10.3)
Chloride: 103 mmol/L (ref 101–111)
Creatinine, Ser: 0.7 mg/dL (ref 0.44–1.00)
GFR calc Af Amer: >60 mL/min (ref >60)
GLUCOSE: 98 mg/dL (ref 65–99)
POTASSIUM: 3.1 mmol/L — AB (ref 3.5–5.1)
Sodium: 137 mmol/L (ref 135–145)

## 2017-05-09 LAB — CBC
HEMATOCRIT: 38.2 % (ref 36.0–46.0)
HEMOGLOBIN: 12.8 g/dL (ref 12.0–15.0)
MCH: 33.4 pg (ref 26.0–34.0)
MCHC: 33.5 g/dL (ref 30.0–36.0)
MCV: 99.7 fL (ref 78.0–100.0)
Platelets: 84 10*3/uL — ABNORMAL LOW (ref 150–400)
RBC: 3.83 MIL/uL — ABNORMAL LOW (ref 3.87–5.11)
RDW: 13 % (ref 11.5–15.5)
WBC: 3.5 10*3/uL — ABNORMAL LOW (ref 4.0–10.5)

## 2017-05-09 MED ORDER — POTASSIUM CHLORIDE CRYS ER 20 MEQ PO TBCR
40.0000 meq | EXTENDED_RELEASE_TABLET | Freq: Once | ORAL | Status: AC
Start: 2017-05-09 — End: 2017-05-09
  Administered 2017-05-09: 40 meq via ORAL
  Filled 2017-05-09: qty 2

## 2017-05-09 MED ORDER — LEVOFLOXACIN 750 MG PO TABS: 750.0000 mg | ORAL_TABLET | Freq: Every day | ORAL | Status: DC

## 2017-05-09 MED ORDER — LEVOFLOXACIN 750 MG PO TABS: 750.0000 mg | ORAL_TABLET | Freq: Every day | ORAL | 0 refills | Status: AC

## 2017-05-09 NOTE — Discharge Summary (Signed)
Discharge Summary  Ruth Stephenson GLO:[id number] DOB: [birth year]  PCP: Patient, No Pcp Per  Admit date: 05/04/2017 Discharge date: 05/09/2017  Time spent: 25 minutes  Recommendations for Outpatient Follow-up:  1. Follow up with your PCP within a week 2. Take your medications as prescribed 3. Stop illicit drug use   Discharge Diagnoses:  Active Hospital Problems   Diagnosis Date Noted  . Altered mental status   . Polysubstance abuse ([place])   . Acute respiratory failure with hypoxia and hypercapnia ([facility])   . HIV disease ([place])   . Overdose 05/04/2017    Resolved Hospital Problems  No resolved problems to display.    Discharge Condition: Stable  Diet recommendation: Resume previous diet  Vitals:   05/09/17 0643 05/09/17 1227  BP: 110/62 (!) 105/56  Pulse: 62 (!) 59  Resp: 16 20  Temp: 98.4 F (36.9 C) 98.4 F (36.9 C)  SpO2: 100% 100%    History of present illness:  Ruth Stephenson is a 66 year old Female with PMH significant for polysubstance abuse including cocaine, opiates, benzos, tobacco, HIV, Hep C, cirrhosis, heroin abuse,  Colon cancer s/p resection, emphysema who presented unresponsive on 05/04/17 after returning from methadone clinic. Severe hypercarbia with UDS positive for opiates, benzos, and cocaine. Intubated to protect airway and admitted in the ICU for multifocal PNA vs aspiration PNA, present on admission. Echo done due to suspicion for endocarditis, no evidence. Blood cx negative x2. Sputum cx positive for klebsiella pneumonia sensitive to fluoroquinolones. Transferred to general medical floor on 04/28/17 continued to improve. Alert and oriented x3. Was extubated on 05/07/17.  On the day of discharge the pt was hemodynamically stable and inquiring about going home.  Hospital Course:  Active Problems:   Overdose   Altered mental status   Polysubstance abuse (HCC)   Acute respiratory failure with hypoxia and hypercapnia (HCC)   HIV disease (HCC)  Acute  hypoxic/hypercapnic respiratory failure from aspiration pneumonia vs multifocal PNA. - Improving -Tobacco abuse with emphysema. - day 6/7 of Abx IV rocephin  Acute metabolic encephalopathy - resolved - Polysubstance abuse cocaine, benzo, opiates - resume methadone 11/13  Hx of Hep C with cirrhosis and concern for hepatic encephalopathy with elevated ammonia. - continue po lactulose - continue thiamine, folic acid  HIV 1 Ab positive. - keep appointment with Dr. Johnnye Stephenson in [place] clinic - resume symtuza  Thrombocytopenia in setting of cirrhosis. - No active bleeding  Hyperglycemia. - resolved  Bradycardia - Asymptomatic - BP is stable    Procedures:  None  Consultations:  Pulmonology CCM  Discharge Exam: BP (!) 105/56 (BP Location: Right Arm)   Pulse (!) 59   Temp 98.4 F (36.9 C) (Oral)   Resp 20   Ht 5\' 2"  (1.575 m)   Wt 74.1 kg (163 lb 4.8 oz)   SpO2 100%   BMI 29.87 kg/m   General: 66 year old female well developed well nourished in NAD Cardiovascular: RRR with no rubs or gallops Respiratory: Mild crackles at bases bilaterally, no wheezes  Discharge Instructions You were cared for by a hospitalist during your hospital stay. If you have any questions about your discharge medications or the care you received while you were in the hospital after you are discharged, you can call the unit and asked to speak with the hospitalist on call if the hospitalist that took care of you is not available. Once you are discharged, your primary care physician will handle any further medical issues. Please note  that NO REFILLS for any discharge medications will be authorized once you are discharged, as it is imperative that you return to your primary care physician (or establish a relationship with a primary care physician if you do not have one) for your aftercare needs so that they can reassess your need for medications and monitor your lab values.   Allergies as of  05/09/2017   No Known Allergies     Medication List    TAKE these medications   albuterol 108 (90 Base) MCG/ACT inhaler Commonly known as:  PROVENTIL HFA;VENTOLIN HFA Inhale 2 puffs every 2 (two) hours as needed into the lungs for wheezing or shortness of breath.   levofloxacin 750 MG tablet Commonly known as:  LEVAQUIN Take 1 tablet (750 mg total) daily for 5 days by mouth.   methadone 10 MG/ML solution Commonly known as:  DOLOPHINE Take 45 mg daily by mouth.   SYMTUZA 800-150-200-10 MG Tabs Generic drug:  Darunavir-Cobicisctat-Emtricitabine-Tenofovir Alafenamide Take 1 tablet daily by mouth.      No Known Allergies    The results of significant diagnostics from this hospitalization (including imaging, microbiology, ancillary and laboratory) are listed below for reference.    Significant Diagnostic Studies: Ct Head Wo Contrast  Result Date: 05/04/2017 CLINICAL DATA:  Altered level of consciousness EXAM: CT HEAD WITHOUT CONTRAST TECHNIQUE: Contiguous axial images were obtained from the base of the skull through the vertex without intravenous contrast. COMPARISON:  None. FINDINGS: Brain: No evidence of acute infarction, hemorrhage, hydrocephalus, extra-axial collection or mass lesion/mass effect. Vascular: No hyperdense vessel or unexpected calcification. Scattered calcifications at the carotid siphons Skull: Normal. Negative for fracture or focal lesion. Sinuses/Orbits: Mild mucosal thickening in the ethmoid and maxillary sinuses. No acute orbital abnormality Other: None IMPRESSION: No CT evidence for acute intracranial abnormality. Electronically Signed   By: Donavan Foil M.D.   On: 05/04/2017 18:26   Ct Chest W Contrast  Result Date: 05/05/2017 CLINICAL DATA:  Acute respiratory illness and abdominal distension EXAM: CT CHEST, ABDOMEN, AND PELVIS WITH CONTRAST TECHNIQUE: Multidetector CT imaging of the chest, abdomen and pelvis was performed following the standard protocol  during bolus administration of intravenous contrast. CONTRAST:  150mL ISOVUE-300 IOPAMIDOL (ISOVUE-300) INJECTION 61% COMPARISON:  None. FINDINGS: CT CHEST FINDINGS Cardiovascular: Nonaneurysmal aorta. Atherosclerotic calcifications. Normal heart size. No pericardial effusion Mediastinum/Nodes: Midline trachea. Endotracheal tube tip terminates about 1.5 cm superior to the carina. No significantly enlarged mediastinal or hilar lymph nodes. Esophageal tube is present in the esophagus and the tip terminates in the distal stomach. Lungs/Pleura: Mild to moderate emphysema. Bronchiectasis and scarring in the left upper lobe. Small ground-glass foci in the right upper lobe with partial consolidations in the bilateral lower lobes. Trace pleural effusions. No pneumothorax. Musculoskeletal: Degenerative changes. No acute or suspicious bone lesion. CT ABDOMEN PELVIS FINDINGS Hepatobiliary: Nodular liver contour suspicious for cirrhosis. Faint hyperenhancing focus within the anterior left hepatic lobe measuring approximately 14 mm. No calcified gallstones. No biliary dilatation. Pancreas: Unremarkable. No pancreatic ductal dilatation or surrounding inflammatory changes. Spleen: Heterogeneous enhancement. Adrenals/Urinary Tract: Adrenal glands are unremarkable. Kidneys are normal, without renal calculi, focal lesion, or hydronephrosis. Bladder contains a Foley catheter Stomach/Bowel: Stomach is within normal limits. Appendix not well seen but no right lower quadrant inflammation. No evidence of bowel wall thickening, distention, or inflammatory changes. Focally prominent stool in the sigmoid colon. Vascular/Lymphatic: Aortic atherosclerosis. No enlarged abdominal or pelvic lymph nodes. Reproductive: Uterus and bilateral adnexa are unremarkable. Other: No free air or significant  free fluid. Small fat containing ventral hernia. Small fatty umbilical hernia. Musculoskeletal: No acute or suspicious bone lesion IMPRESSION: 1. Tiny  pleural effusions. Small ground-glass foci in the right upper lobe are suspicious for small foci of pneumonitis or infection. Partial consolidations within the posterior right upper lobe and posterior lower lobes may reflect multifocal pneumonia and or aspiration. 2. Mild to moderate emphysema 3. Cirrhotic morphology of the liver. Hyperdense focus within the anterior left hepatic lobe ; when clinically feasible, evaluation with liver MRI is suggested, preferably as an outpatient when the patient is able to breath hold. Electronically Signed   By: Donavan Foil M.D.   On: 05/05/2017 02:33   Ct Abdomen Pelvis W Contrast  Result Date: 05/05/2017 CLINICAL DATA:  Acute respiratory illness and abdominal distension EXAM: CT CHEST, ABDOMEN, AND PELVIS WITH CONTRAST TECHNIQUE: Multidetector CT imaging of the chest, abdomen and pelvis was performed following the standard protocol during bolus administration of intravenous contrast. CONTRAST:  169mL ISOVUE-300 IOPAMIDOL (ISOVUE-300) INJECTION 61% COMPARISON:  None. FINDINGS: CT CHEST FINDINGS Cardiovascular: Nonaneurysmal aorta. Atherosclerotic calcifications. Normal heart size. No pericardial effusion Mediastinum/Nodes: Midline trachea. Endotracheal tube tip terminates about 1.5 cm superior to the carina. No significantly enlarged mediastinal or hilar lymph nodes. Esophageal tube is present in the esophagus and the tip terminates in the distal stomach. Lungs/Pleura: Mild to moderate emphysema. Bronchiectasis and scarring in the left upper lobe. Small ground-glass foci in the right upper lobe with partial consolidations in the bilateral lower lobes. Trace pleural effusions. No pneumothorax. Musculoskeletal: Degenerative changes. No acute or suspicious bone lesion. CT ABDOMEN PELVIS FINDINGS Hepatobiliary: Nodular liver contour suspicious for cirrhosis. Faint hyperenhancing focus within the anterior left hepatic lobe measuring approximately 14 mm. No calcified gallstones.  No biliary dilatation. Pancreas: Unremarkable. No pancreatic ductal dilatation or surrounding inflammatory changes. Spleen: Heterogeneous enhancement. Adrenals/Urinary Tract: Adrenal glands are unremarkable. Kidneys are normal, without renal calculi, focal lesion, or hydronephrosis. Bladder contains a Foley catheter Stomach/Bowel: Stomach is within normal limits. Appendix not well seen but no right lower quadrant inflammation. No evidence of bowel wall thickening, distention, or inflammatory changes. Focally prominent stool in the sigmoid colon. Vascular/Lymphatic: Aortic atherosclerosis. No enlarged abdominal or pelvic lymph nodes. Reproductive: Uterus and bilateral adnexa are unremarkable. Other: No free air or significant free fluid. Small fat containing ventral hernia. Small fatty umbilical hernia. Musculoskeletal: No acute or suspicious bone lesion IMPRESSION: 1. Tiny pleural effusions. Small ground-glass foci in the right upper lobe are suspicious for small foci of pneumonitis or infection. Partial consolidations within the posterior right upper lobe and posterior lower lobes may reflect multifocal pneumonia and or aspiration. 2. Mild to moderate emphysema 3. Cirrhotic morphology of the liver. Hyperdense focus within the anterior left hepatic lobe ; when clinically feasible, evaluation with liver MRI is suggested, preferably as an outpatient when the patient is able to breath hold. Electronically Signed   By: Donavan Foil M.D.   On: 05/05/2017 02:33   Dg Chest Port 1 View  Result Date: 05/08/2017 CLINICAL DATA:  Aspiration pneumonia . EXAM: PORTABLE CHEST 1 VIEW COMPARISON:  05/05/2017. FINDINGS: Interim extubation and removal of NG tube. Mediastinum and hilar structures normal. Heart size normal. Bibasilar atelectasis. No pleural effusion or pneumothorax . Biapical pleural thickening again noted consistent scarring . IMPRESSION: 1.  Interim extubation and removal of NG tube. 2. Mild bibasilar  atelectasis . Electronically Signed   By: Marcello Moores  Register   On: 05/08/2017 07:12   Dg Chest Port 1 [address]  Result Date: 05/05/2017 CLINICAL DATA:  Hypoxia EXAM: PORTABLE CHEST 1 VIEW COMPARISON:  Chest radiograph May 04, 2017 and chest CT May 05, 2017 FINDINGS: Endotracheal tube tip is 3.8 cm above the carina. Nasogastric tube tip and side port below the diaphragm. No pneumothorax. There has been interval resolution of interstitial edema. There is atelectatic change in the left base. No well-defined airspace consolidation. Heart is upper normal in size with pulmonary vascularity within normal limits. No adenopathy. There is aortic atherosclerosis. No evident bone lesions. IMPRESSION: Tube positions as described without pneumothorax. Interval resolution of edema. Patchy atelectasis left base present. No consolidation. Stable cardiac silhouette. There is aortic atherosclerosis. Aortic Atherosclerosis (ICD10-I70.0). Electronically Signed   By: Lowella Grip III M.D.   On: 05/05/2017 07:42   Dg Chest Port 1 View  Result Date: 05/04/2017 CLINICAL DATA:  Altered mental status EXAM: PORTABLE CHEST 1 VIEW COMPARISON:  02/22/2017 FINDINGS: Endotracheal tube tip is approximately 3.2 cm superior to the carina. Esophageal tube tip extends below the diaphragm but is not included. No pleural effusion. Heart size within normal limits. Aortic atherosclerosis. Vascular congestion. Mild diffuse interstitial opacities suggesting edema. Patchy atelectasis or infiltrate at the left base. IMPRESSION: 1. Support lines and tubes as above. 2. Vascular congestion with mild diffuse interstitial edema 3. Patchy atelectasis or infiltrate at the left base Electronically Signed   By: Donavan Foil M.D.   On: 05/04/2017 18:02    Microbiology: Recent Results (from the past 240 hour(s))  Blood culture (routine x 2)     Status: None   Collection Time: 05/04/17  6:58 PM  Result Value Ref Range Status   Specimen Description  BLOOD LEFT WRIST  Final   Special Requests IN PEDIATRIC BOTTLE Blood Culture adequate volume  Final   Culture NO GROWTH 5 DAYS  Final   Report Status 05/09/2017 FINAL  Final  Blood culture (routine x 2)     Status: None   Collection Time: 05/04/17  7:30 PM  Result Value Ref Range Status   Specimen Description BLOOD RIGHT HAND  Final   Special Requests IN PEDIATRIC BOTTLE Blood Culture adequate volume  Final   Culture NO GROWTH 5 DAYS  Final   Report Status 05/09/2017 FINAL  Final  Urine culture     Status: None   Collection Time: 05/04/17  7:52 PM  Result Value Ref Range Status   Specimen Description URINE, RANDOM  Final   Special Requests NONE  Final   Culture NO GROWTH  Final   Report Status 05/06/2017 FINAL  Final  Culture, respiratory (tracheal aspirate)     Status: None   Collection Time: 05/04/17  8:18 PM  Result Value Ref Range Status   Specimen Description TRACHEAL ASPIRATE  Final   Special Requests NONE  Final   Gram Stain   Final    MODERATE WBC PRESENT, PREDOMINANTLY PMN RARE SQUAMOUS EPITHELIAL CELLS PRESENT RARE GRAM POSITIVE COCCI IN PAIRS    Culture FEW KLEBSIELLA PNEUMONIAE  Final   Report Status 05/07/2017 FINAL  Final   Organism ID, Bacteria KLEBSIELLA PNEUMONIAE  Final      Susceptibility   Klebsiella pneumoniae - MIC*    AMPICILLIN RESISTANT Resistant     CEFAZOLIN <=4 SENSITIVE Sensitive     CEFEPIME <=1 SENSITIVE Sensitive     CEFTAZIDIME <=1 SENSITIVE Sensitive     CEFTRIAXONE <=1 SENSITIVE Sensitive     CIPROFLOXACIN <=0.25 SENSITIVE Sensitive     GENTAMICIN <=1 SENSITIVE Sensitive  IMIPENEM <=0.25 SENSITIVE Sensitive     TRIMETH/SULFA >=320 RESISTANT Resistant     AMPICILLIN/SULBACTAM 4 SENSITIVE Sensitive     PIP/TAZO <=4 SENSITIVE Sensitive     Extended ESBL NEGATIVE Sensitive     * FEW KLEBSIELLA PNEUMONIAE  MRSA PCR Screening     Status: None   Collection Time: 05/04/17  9:18 PM  Result Value Ref Range Status   MRSA by PCR NEGATIVE  NEGATIVE Final    Comment:        The GeneXpert MRSA Assay (FDA approved for NASAL specimens only), is one component of a comprehensive MRSA colonization surveillance program. It is not intended to diagnose MRSA infection nor to guide or monitor treatment for MRSA infections.      Labs: Basic Metabolic Panel: Recent Labs  Lab 05/05/17 0307 05/06/17 0550 05/07/17 0241 05/08/17 0618 05/09/17 0516  NA 138 147* 144 139 137  K 3.8 4.6 4.0 3.4* 3.1*  CL 106 115* 113* 104 103  CO2 27 27 24 26 29   GLUCOSE 95 85 92 132* 98  BUN 7 11 10 7 8   CREATININE 0.67 0.71 0.68 0.62 0.70  CALCIUM 8.8* 9.1 8.9 9.0 8.6*  MG 1.6* 2.1 1.6* 1.6*  --   PHOS 2.7 2.8 2.5  --   --    Liver Function Tests: Recent Labs  Lab 05/04/17 1730 05/06/17 0550 05/07/17 0241 05/08/17 0618  AST 23  --   --  25  ALT 16  --   --  13*  ALKPHOS 90  --   --  69  BILITOT 1.1  --   --  1.9*  PROT 8.3*  --   --  6.8  ALBUMIN 3.9 3.3* 2.9* 3.0*   No results for input(s): LIPASE, AMYLASE in the last 168 hours. Recent Labs  Lab 05/04/17 1915 05/05/17 0307  AMMONIA 66* 59*   CBC: Recent Labs  Lab 05/04/17 1730  05/05/17 0307 05/06/17 0550 05/07/17 0241 05/08/17 0307 05/09/17 0516  WBC 2.3*  --  4.1 4.4 4.6 6.2 3.5*  NEUTROABS 1.7  --   --  3.1 2.8  --   --   HGB 14.8   < > 13.4 14.3 12.9 14.4 12.8  HCT 45.2   < > 40.3 44.9 40.0 43.1 38.2  MCV 102.3*  --  100.5* 104.2* 102.8* 101.4* 99.7  PLT 74*  --  69* 60* 97* 65* 84*   < > = values in this interval not displayed.   Cardiac Enzymes: No results for input(s): CKTOTAL, CKMB, CKMBINDEX, TROPONINI in the last 168 hours. BNP: BNP (last 3 results) No results for input(s): BNP in the last 8760 hours.  ProBNP (last 3 results) No results for input(s): PROBNP in the last 8760 hours.  CBG: Recent Labs  Lab 05/07/17 2347 05/08/17 0348 05/08/17 0747 05/08/17 1133 05/08/17 1530  GLUCAP 134* 124* 127* 128* 126*       Signed:  Kayleen Memos, MD [facility] 05/09/2017, 1:46 PM

## 2017-05-09 NOTE — Progress Notes (Signed)
Pt discharge instructions given, IV and tele removed. Question answered. Taken out via wheelchair.

## 2017-05-09 NOTE — Care Management Important Message (Signed)
Important Message  Patient Details  Name: Ruth Stephenson MRN: [id number] Date of Birth: [birth year]   Medicare Important Message Given:  Yes    Orbie Pyo 05/09/2017, 12:44 PM

## 2017-05-09 NOTE — Progress Notes (Signed)
Physical Therapy Treatment Patient Details Name: Ruth Stephenson MRN: [id number] DOB: April 23, 1951 Today's Date: 05/09/2017    History of Present Illness Pt is a 66 y.o. female who was found unresponsive at home after visiting methadone clinic and admitted 05/04/17.  She required intubation.  UDS positive for benzo's, opiates, and cocaine.  PMHx of colon cancer, Hep C, HIV, cirrhosis, emphysema.   PT Comments    Pt progressing well with mobility. Transfer and ambulating with intermittent use of single UE support and supervision for safety. Has slight decrease in safety awareness, requiring cues to attend to IV line when using bathroom and getting washed up. Feel pt will not need HHPT services at d/c as she is moving well and will have 24/7 assist available. Pt also states she would not agree to HHPT or any DME use even if needed. Will continue to follow acutely as pt declining stair training today.   Follow Up Recommendations  No PT follow up;Supervision/Assistance - 24 hour     Equipment Recommendations  None recommended by PT    Recommendations for Other Services       Precautions / Restrictions Precautions Precautions: Fall Restrictions Weight Bearing Restrictions: No    Mobility  Bed Mobility Overal bed mobility: Independent Bed Mobility: Supine to Sit              Transfers Overall transfer level: Independent Equipment used: None                Ambulation/Gait Ambulation/Gait assistance: Supervision Ambulation Distance (Feet): 150 Feet Assistive device: None(pushing IV pole) Gait Pattern/deviations: Step-through pattern;Decreased stride length Gait velocity: Decreased Gait velocity interpretation: <1.8 ft/sec, indicative of risk for recurrent falls General Gait Details: Pt with mild instability amb in room with no AD. Amb in hallway pushing IV pole with RUE with much improved stability. Pt states she is not willing to use any DME at home   Stairs             Wheelchair Mobility    Modified Rankin (Stroke Patients Only)       Balance Overall balance assessment: Needs assistance Sitting-balance support: No upper extremity supported;Feet supported Sitting balance-Leahy Scale: Good     Standing balance support: During functional activity Standing balance-Leahy Scale: Good Standing balance comment: Able to reach down to wipe floor in standing                            Cognition Arousal/Alertness: Awake/alert Behavior During Therapy: WFL for tasks assessed/performed Overall Cognitive Status: Within Functional Limits for tasks assessed                                        Exercises      General Comments General comments (skin integrity, edema, etc.): Sister present during session      Pertinent Vitals/Pain Pain Assessment: No/denies pain    Home Living                      Prior Function            PT Goals (current goals can now be found in the care plan section) Acute Rehab PT Goals Patient Stated Goal: get better PT Goal Formulation: With patient Time For Goal Achievement: 05/15/17 Potential to Achieve Goals: Good Progress towards PT goals: Progressing toward goals  Frequency    Min 3X/week      PT Plan Discharge plan needs to be updated;Equipment recommendations need to be updated    Co-evaluation              AM-PAC PT "6 Clicks" Daily Activity  Outcome Measure  Difficulty turning over in bed (including adjusting bedclothes, sheets and blankets)?: None Difficulty moving from lying on back to sitting on the side of the bed? : None Difficulty sitting down on and standing up from a chair with arms (e.g., wheelchair, bedside commode, etc,.)?: None Help needed moving to and from a bed to chair (including a wheelchair)?: A Little Help needed walking in hospital room?: A Little Help needed climbing 3-5 steps with a railing? : A Little 6 Click Score:  21    End of Session Equipment Utilized During Treatment: Gait belt Activity Tolerance: Patient tolerated treatment well Patient left: in chair;with call bell/phone within reach;with family/visitor present;Other (comment)(getting washed up) Nurse Communication: Mobility status PT Visit Diagnosis: Unsteadiness on feet (R26.81)     Time: 0160-1093 PT Time Calculation (min) (ACUTE ONLY): 15 min  Charges:  $Gait Training: 8-22 mins                    G Codes:      Mabeline Caras, PT, DPT Acute Rehab Services  Pager: [place] 05/09/2017, 9:59 AM

## 2017-05-10 ENCOUNTER — Encounter (HOSPITAL_COMMUNITY): Payer: Self-pay | Admitting: Emergency Medicine

## 2017-05-14 ENCOUNTER — Inpatient Hospital Stay: Payer: Medicaid Other | Admitting: Adult Health

## 2017-05-16 ENCOUNTER — Ambulatory Visit: Payer: Medicaid Other | Admitting: Infectious Diseases

## 2017-05-22 ENCOUNTER — Telehealth: Payer: Self-pay | Admitting: Pharmacist

## 2017-05-22 NOTE — Telephone Encounter (Signed)
[name] just turned 23 and has Medicare/Medicaid now but no Part D. We were able to get a 30 day supply of Symtuza for her, but cannot get in touch with her to see if she has gotten the Part D. I called all three numbers and one is out of service (deleted from epic). There was no answer on the other two and no option to leave VM. Ruth Stephenson has been trying to get in touch with her for weeks with no luck. She no showed Dr. Johnnye Sima last week.

## 2017-07-11 ENCOUNTER — Other Ambulatory Visit: Payer: Self-pay | Admitting: Pharmacist Clinician (PhC)/ Clinical Pharmacy Specialist

## 2017-07-11 ENCOUNTER — Other Ambulatory Visit: Payer: Medicare Other

## 2017-07-11 DIAGNOSIS — B20 Human immunodeficiency virus [HIV] disease: Secondary | ICD-10-CM | POA: Diagnosis not present

## 2017-07-11 MED ORDER — SULFAMETHOXAZOLE-TRIMETHOPRIM 400-80 MG PO TABS: 1.0000 | ORAL_TABLET | Freq: Every day | ORAL | 3 refills | Status: DC

## 2017-07-11 MED ORDER — DARUN-COBIC-EMTRICIT-TENOFAF 800-150-200-10 MG PO TABS: 1.0000 | ORAL_TABLET | Freq: Every day | ORAL | 3 refills | Status: DC

## 2017-07-11 MED FILL — SYMTUZA 800-150-200-10 MG T: 800-150-200 | 30 days supply | Qty: 30 | Fill #0

## 2017-07-11 MED FILL — SULFAMETHOXAZOLE-TMP SS TAB: 400-80 | 30 days supply | Qty: 30 | Fill #0

## 2017-07-11 NOTE — Progress Notes (Signed)
HPI: Ruth Stephenson is a 67 y.o. female who walked in today and asked to be seen by pharmacy.   Allergies: No Known Allergies  Vitals:    Past Medical History: Past Medical History:  Diagnosis Date  . Cancer ([place])   . Hepatitis C   . HIV (human immunodeficiency virus infection) ([place])     Social History: Social History   Socioeconomic History  . Marital status: Single    Spouse name: Not on file  . Number of children: Not on file  . Years of education: Not on file  . Highest education level: Not on file  Social Needs  . Financial resource strain: Not on file  . Food insecurity - worry: Not on file  . Food insecurity - inability: Not on file  . Transportation needs - medical: Not on file  . Transportation needs - non-medical: Not on file  Occupational History  . Not on file  Tobacco Use  . Smoking status: Current Every Day Smoker    Packs/day: 0.50    Types: Cigarettes  . Smokeless tobacco: Never Used  . Tobacco comment: trying to cut back  Substance and Sexual Activity  . Alcohol use: Yes    Comment: Occasionally   . Drug use: Yes    Types: Cocaine, Marijuana    Comment: Heroin   . Sexual activity: Not on file    Comment: declined condoms  Other Topics Concern  . Not on file  Social History Narrative   ** Merged History Encounter **        Previous Regimen: Genvoya  Current Regimen: Off of Symtuza x7d  Labs: HIV 1 RNA Quant (copies/mL)  Date Value  05/04/2017 200  04/05/2017 <20 NOT DETECTED  01/11/2016 <20   CD4 T Cell Abs (/uL)  Date Value  04/05/2017 220 (L)  04/12/2016 160 (L)  01/11/2016 290 (L)   Hep B S Ab (no units)  Date Value  07/14/2013 NEG   Hepatitis B Surface Ag (no units)  Date Value  07/14/2013 NEGATIVE    CrCl: CrCl cannot be calculated (Patient's most recent lab result is older than the maximum 21 days allowed.).  Lipids:    Component Value Date/Time   CHOL 133 04/12/2016 1600   TRIG 68 04/12/2016 1600   HDL  52 04/12/2016 1600   CHOLHDL 2.6 04/12/2016 1600   VLDL 14 04/12/2016 1600   LDLCALC 67 04/12/2016 1600    Assessment: Ruth Stephenson walked in today to ask to see pharmacy. She last saw me in Oct due to chronic no shows. At the time, she was recently in jail for shoplifting. In addition, Medicaid denied her coverage of meds due to the need to get Medicare part D. We gave her direction on where to go to sign up. During today's visit, she said that she has gone to [facility] to sign up for something but she doesn't know if they signed her up for a part D plan. She doesn't have any insurance card with her.   She ran out of meds about 6 days ago. She should have ran out before that based on the 30d supply of Symtuza that we got her in Oct. Apparently, she got left over [place] so she was taking that instead. Counseled her extensively that she should not miss anymore appts and she must reach out to [place] again. We got Ruth Stephenson to check the system to see if she has new insurance yet. She has coverage now. Therefore, we will  get all labs today and restart her on Symtuza and her Bactrim. Meds will be sent to [facility] so they can be shipped to her. I was adamant about making visits before more refills. We will see her in in 6 wks for labs then schedule to see Dr. Johnnye Sima.   She is in the methadone program but I have suspicion that she still uses drugs. It's the thing that have "saved" her.   Recommendations:  Symtuza 1 PO qday with meal to [facility] Bactrim SS 1 PO qday F/u with pharmacy in 6 wks for labs Will schedule to f/u with Dr. Audry Pili, PharmD, BCPS, AAHIVP, CPP Clinical Infectious [place] for Infectious Disease 07/11/2017, 1:38 PM

## 2017-07-12 LAB — COMPLETE METABOLIC PANEL WITH GFR
AG Ratio: 1.1 (calc) (ref 1.0–2.5)
ALBUMIN MSPROF: 4 g/dL (ref 3.6–5.1)
ALT: 9 U/L (ref 6–29)
AST: 16 U/L (ref 10–35)
Alkaline phosphatase (APISO): 83 U/L (ref 33–130)
BUN: 15 mg/dL (ref 7–25)
CALCIUM: 9.7 mg/dL (ref 8.6–10.4)
CHLORIDE: 104 mmol/L (ref 98–110)
CO2: 30 mmol/L (ref 20–32)
CREATININE: 0.71 mg/dL (ref 0.50–0.99)
GFR, EST AFRICAN AMERICAN: 104 mL/min/{1.73_m2} (ref >=60)
GFR, EST NON AFRICAN AMERICAN: 89 mL/min/{1.73_m2} (ref >=60)
GLUCOSE: 106 mg/dL — AB (ref 65–99)
Globulin: 3.7 g/dL (calc) (ref 1.9–3.7)
Potassium: 4.4 mmol/L (ref 3.5–5.3)
Sodium: 142 mmol/L (ref 135–146)
TOTAL PROTEIN: 7.7 g/dL (ref 6.1–8.1)
Total Bilirubin: 0.5 mg/dL (ref 0.2–1.2)

## 2017-07-12 LAB — CBC
HCT: 45.2 % — ABNORMAL HIGH (ref 35.0–45.0)
HEMOGLOBIN: 15.4 g/dL (ref 11.7–15.5)
MCH: 32.7 pg (ref 27.0–33.0)
MCHC: 34.1 g/dL (ref 32.0–36.0)
MCV: 96 fL (ref 80.0–100.0)
RBC: 4.71 10*6/uL (ref 3.80–5.10)
RDW: 11.4 % (ref 11.0–15.0)
WBC: 2.6 10*3/uL — AB (ref 3.8–10.8)

## 2017-07-12 LAB — T-HELPER CELL (CD4) - (RCID CLINIC ONLY)
CD4 % Helper T Cell: 19 % — ABNORMAL LOW (ref 33–55)
CD4 T Cell Abs: 210 /uL — ABNORMAL LOW (ref 400–2700)

## 2017-07-13 LAB — HEPATITIS C RNA QUANTITATIVE
HCV Quantitative Log: <1.18 Log IU/mL
HCV RNA, PCR, QN: NOT DETECTED [IU]/mL

## 2017-07-17 LAB — HIV RNA, RTPCR W/R GT (RTI, PI,INT)
HIV 1 RNA QUANT: 23 {copies}/mL — AB
HIV-1 RNA QUANT, LOG: 1.36 {Log_copies}/mL — AB

## 2017-08-23 ENCOUNTER — Ambulatory Visit: Payer: Medicare Other

## 2017-09-04 ENCOUNTER — Telehealth: Payer: Self-pay | Admitting: Pharmacy Technician

## 2017-09-04 NOTE — Telephone Encounter (Signed)
Ruth Stephenson and Ruth Stephenson have been trying to reach Ruth Stephenson to refill her medicaitons.  No answer, no voicemail.  Tried both phone numbers multiple times.

## 2017-10-05 ENCOUNTER — Telehealth: Payer: Self-pay | Admitting: Pharmacist

## 2017-10-05 NOTE — Telephone Encounter (Signed)
I have been trying to contact patient regarding follow-up and medication adherence. No answer - she has not answered [facility] either to get her Symtuza. Will send to triage/[name] to see if can help.

## 2017-10-09 NOTE — Telephone Encounter (Signed)
Thanks Cassie I am hoping [employer] can help

## 2017-10-17 ENCOUNTER — Telehealth: Payer: Self-pay | Admitting: *Deleted

## 2017-10-17 ENCOUNTER — Ambulatory Visit: Payer: Self-pay | Admitting: *Deleted

## 2017-10-17 DIAGNOSIS — B20 Human immunodeficiency virus [HIV] disease: Secondary | ICD-10-CM

## 2017-10-17 NOTE — Telephone Encounter (Signed)
I have been unable to reach the patient or with a drive by to the home. Contacted the Emergency contact and left a vague message stating I am a nurse with [facility] and we have been trying to get in contact with Ms. Steckman without success. I asked for a return call or text stating we would like to be sure Ms Kable is doing well.

## 2017-10-17 NOTE — Progress Notes (Signed)
We have been unable to reach the patient since November so after attempting to call the patient today without success I drove to the address on file. The apartment appeared vacant from the outside and a representative of the apartment complex confirmed that no one lived at the address listed from [name]. They representative was currently cleaning the apartment so it can be rented out again.   I checked [place] to see if that patient may be receiving RW services elsewhere and the database says she may have a upcoming appt at [facility] stated [name] does not have any upcming appts with them.  I sent a email to [name] stating :Hello [name], My name is Kinnie Scales and I am a registered nurse with Dr. Algis Downs office. We have been trying to reach you by phone to be sure all is well, but haven't been able to reach you. We have a tendency to worry about our people when we haven't heard from you in a while. Is everything going well?  Can I offer any assistance with helping you get your medications?  I am here and happy to help.  Kinnie Scales RN [phone] (call or text me) [email or web address]  I also checked to see if she was incarcerated but it does not look like she is.  Waiting on a reply to the email at this time

## 2017-10-17 NOTE — Telephone Encounter (Signed)
Happy to help! It looks like we have been trying to reach [name] since Nov. I plan on doing a drive by to her home today just to see if that helps.

## 2017-10-17 NOTE — Telephone Encounter (Signed)
We have been unable to reach Ruth Stephenson by phone since Nov. I tried to reach her by phone today and both numbers are still inactive. Planned a drive by to the home to try and engage with the patient. Drive by planned for today.

## 2017-12-24 ENCOUNTER — Other Ambulatory Visit: Payer: Medicare Other

## 2017-12-24 ENCOUNTER — Other Ambulatory Visit: Payer: Self-pay | Admitting: *Deleted

## 2017-12-24 DIAGNOSIS — B20 Human immunodeficiency virus [HIV] disease: Secondary | ICD-10-CM

## 2018-01-02 ENCOUNTER — Other Ambulatory Visit: Payer: Medicare Other

## 2018-01-02 DIAGNOSIS — B20 Human immunodeficiency virus [HIV] disease: Secondary | ICD-10-CM | POA: Diagnosis not present

## 2018-01-03 LAB — COMPLETE METABOLIC PANEL WITH GFR
AG Ratio: 0.6 (calc) — ABNORMAL LOW (ref 1.0–2.5)
ALT: 14 U/L (ref 6–29)
AST: 31 U/L (ref 10–35)
Albumin: 3.5 g/dL — ABNORMAL LOW (ref 3.6–5.1)
Alkaline phosphatase (APISO): 65 U/L (ref 33–130)
BUN: 10 mg/dL (ref 7–25)
CO2: 30 mmol/L (ref 20–32)
Calcium: 9.4 mg/dL (ref 8.6–10.4)
Chloride: 102 mmol/L (ref 98–110)
Creat: 0.68 mg/dL (ref 0.50–0.99)
GFR, Est African American: 106 mL/min/{1.73_m2} (ref >=60)
GFR, Est Non African American: 92 mL/min/{1.73_m2} (ref >=60)
Globulin: 5.6 g/dL (calc) — ABNORMAL HIGH (ref 1.9–3.7)
Glucose, Bld: 108 mg/dL — ABNORMAL HIGH (ref 65–99)
Potassium: 3.8 mmol/L (ref 3.5–5.3)
Sodium: 137 mmol/L (ref 135–146)
Total Bilirubin: 0.6 mg/dL (ref 0.2–1.2)
Total Protein: 9.1 g/dL — ABNORMAL HIGH (ref 6.1–8.1)

## 2018-01-03 LAB — CBC WITH DIFFERENTIAL/PLATELET
Basophils Absolute: 9 cells/uL (ref 0–200)
Basophils Relative: 0.3 %
Eosinophils Absolute: 30 cells/uL (ref 15–500)
Eosinophils Relative: 1 %
HCT: 39.8 % (ref 35.0–45.0)
Hemoglobin: 13.4 g/dL (ref 11.7–15.5)
Lymphs Abs: 1416 cells/uL (ref 850–3900)
MCH: 31.8 pg (ref 27.0–33.0)
MCHC: 33.7 g/dL (ref 32.0–36.0)
MCV: 94.3 fL (ref 80.0–100.0)
MPV: 11.6 fL (ref 7.5–12.5)
Monocytes Relative: 10.7 %
Neutro Abs: 1224 cells/uL — ABNORMAL LOW (ref 1500–7800)
Neutrophils Relative %: 40.8 %
Platelets: 106 10*3/uL — ABNORMAL LOW (ref 140–400)
RBC: 4.22 10*6/uL (ref 3.80–5.10)
RDW: 11.8 % (ref 11.0–15.0)
Total Lymphocyte: 47.2 %
WBC mixed population: 321 cells/uL (ref 200–950)
WBC: 3 10*3/uL — ABNORMAL LOW (ref 3.8–10.8)

## 2018-01-03 LAB — T-HELPER CELL (CD4) - (RCID CLINIC ONLY)
CD4 T CELL HELPER: 16 % — AB (ref 33–55)
CD4 T Cell Abs: 240 /uL — ABNORMAL LOW (ref 400–2700)

## 2018-01-04 LAB — HIV-1 RNA QUANT-NO REFLEX-BLD
HIV 1 RNA QUANT: 782 {copies}/mL — AB
HIV-1 RNA QUANT, LOG: 2.89 {Log_copies}/mL — AB

## 2018-01-11 ENCOUNTER — Encounter: Payer: Self-pay | Admitting: Infectious Diseases

## 2018-01-11 ENCOUNTER — Ambulatory Visit (INDEPENDENT_AMBULATORY_CARE_PROVIDER_SITE_OTHER): Payer: Medicare Other | Admitting: Infectious Diseases

## 2018-01-11 DIAGNOSIS — C182 Malignant neoplasm of ascending colon: Secondary | ICD-10-CM | POA: Diagnosis not present

## 2018-01-11 DIAGNOSIS — K760 Fatty (change of) liver, not elsewhere classified: Secondary | ICD-10-CM | POA: Diagnosis not present

## 2018-01-11 DIAGNOSIS — B20 Human immunodeficiency virus [HIV] disease: Secondary | ICD-10-CM

## 2018-01-11 MED ORDER — DARUN-COBIC-EMTRICIT-TENOFAF 800-150-200-10 MG PO TABS: 1.0000 | ORAL_TABLET | Freq: Every day | ORAL | 3 refills | Status: DC

## 2018-01-11 MED FILL — SYMTUZA 800-150-200-10 MG T: 800-150-200 | 30 days supply | Qty: 30 | Fill #0

## 2018-01-11 NOTE — Assessment & Plan Note (Signed)
Will have her seen by GI and Onc.

## 2018-01-11 NOTE — Progress Notes (Signed)
   Subjective:    Patient ID: Ruth Stephenson, female    DOB: 1951-04-18, 67 y.o.   MRN: [id number]  HPI 67 yo F with HIV+ since 1989, on genvoya. She also has a hx of Hep C treated ([place]) 2015. 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  In 08-2012 she had surgery for colon cancer. She is concerned now as she has hernia at incision. She is able to reduce easily.  Ha not had Onc or GI f/u for "years" (02-2014).  She had CT abd 04-2017: Cirrhotic morphology of the liver. Hyperdense focus within the anterior left hepatic lobe ; when clinically feasible, evaluation with liver MRI is suggested, preferably as an outpatient when the patient is able to breath hold.  She has not been seen in f/u since 03-2016.  She was off ART for a period earlier this year.   HIV 1 RNA Quant (copies/mL)  Date Value  01/02/2018 782 (H)  07/11/2017 23 (H)  05/04/2017 200   CD4 T Cell Abs (/uL)  Date Value  01/02/2018 240 (L)  07/11/2017 210 (L)  04/05/2017 220 (L)    Review of Systems  Constitutional: Positive for appetite change, fatigue and unexpected weight change.  Gastrointestinal: Negative for constipation and diarrhea.  Genitourinary: Negative for difficulty urinating.  Psychiatric/Behavioral: The patient is nervous/anxious.   Please see HPI. All other systems reviewed and negative.      Objective:   Physical Exam  Constitutional: She appears well-developed and well-nourished.  HENT:  Head: Normocephalic.  Eyes: Pupils are equal, round, and reactive to light. EOM are normal.  Neck: Normal range of motion. Neck supple.  Cardiovascular: Normal rate, regular rhythm and normal heart sounds.  Pulmonary/Chest: Effort normal and breath sounds normal.  Abdominal: Bowel sounds are normal. She exhibits distension. There is no tenderness.  Musculoskeletal: She exhibits edema.  Lymphadenopathy:    She has no cervical adenopathy.      Assessment & Plan:

## 2018-01-11 NOTE — Assessment & Plan Note (Addendum)
Will restart her ART Will give her symtuza again, she is enthusiastic about being back on meds and being back in care.  She has gotten PCV.  Pharm meeting with her . rtc in 3 months.

## 2018-01-11 NOTE — Progress Notes (Signed)
HPI: Ruth Stephenson is a 67 y.o. female who is here for see Dr Johnnye Sima for her HIV.   Allergies: No Known Allergies  Vitals: Temp: 98.8 F (37.1 C) (07/19 1036) Temp Source: Oral (07/19 1036) BP: 114/72 (07/19 1036) Pulse Rate: 66 (07/19 1036)  Past Medical History: Past Medical History:  Diagnosis Date  . Cancer ([place])   . Hepatitis C   . HIV (human immunodeficiency virus infection) ([place])     Social History: Social History   Socioeconomic History  . Marital status: Single    Spouse name: Not on file  . Number of children: Not on file  . Years of education: Not on file  . Highest education level: Not on file  Occupational History  . Not on file  Social Needs  . Financial resource strain: Not on file  . Food insecurity:    Worry: Not on file    Inability: Not on file  . Transportation needs:    Medical: Not on file    Non-medical: Not on file  Tobacco Use  . Smoking status: Current Some Day Smoker    Packs/day: 0.50    Types: Cigarettes  . Smokeless tobacco: Never Used  . Tobacco comment: trying to cut back  Substance and Sexual Activity  . Alcohol use: Yes    Comment: Occasionally   . Drug use: Yes    Types: Cocaine, Marijuana    Comment: Heroin   . Sexual activity: Not on file    Comment: declined condoms  Lifestyle  . Physical activity:    Days per week: Not on file    Minutes per session: Not on file  . Stress: Not on file  Relationships  . Social connections:    Talks on phone: Not on file    Gets together: Not on file    Attends religious service: Not on file    Active member of club or organization: Not on file    Attends meetings of clubs or organizations: Not on file    Relationship status: Not on file  Other Topics Concern  . Not on file  Social History Narrative   ** Merged History Encounter **        Previous Regimen: [place]>>Genvoya>>Symtuza  Current Regimen: Off  Labs: HIV 1 RNA Quant (copies/mL)  Date Value  01/02/2018  782 (H)  07/11/2017 23 (H)  05/04/2017 200   CD4 T Cell Abs (/uL)  Date Value  01/02/2018 240 (L)  07/11/2017 210 (L)  04/05/2017 220 (L)   Hep B S Ab (no units)  Date Value  07/14/2013 NEG   Hepatitis B Surface Ag (no units)  Date Value  07/14/2013 NEGATIVE    CrCl: Estimated Creatinine Clearance: 58 mL/min (by C-G formula based on SCr of 0.68 mg/dL).  Lipids:    Component Value Date/Time   CHOL 133 04/12/2016 1600   TRIG 68 04/12/2016 1600   HDL 52 04/12/2016 1600   CHOLHDL 2.6 04/12/2016 1600   VLDL 14 04/12/2016 1600   LDLCALC 67 04/12/2016 1600    Assessment: Starsha has a terrible hx of adherence. She decided to stop taking meds. Delories [employer] was try to reach out to her but even she couldn't get in touch. She decided to take meds again. Due to her reliability, we are going to continue to use Symtuza. She will pick up the supply today and [facility] will mail out from now on. Her current address is correct in the computer.   Recommendations:  Restart Symtuza 1 daily with food  Onnie Boer, PharmD, [place], AAHIVP, CPP Clinical Infectious [place] for Infectious Disease 01/11/2018, 11:34 AM

## 2018-01-12 ENCOUNTER — Encounter (HOSPITAL_COMMUNITY): Payer: Self-pay | Admitting: *Deleted

## 2018-01-12 ENCOUNTER — Emergency Department (HOSPITAL_COMMUNITY)
Admission: EM | Admit: 2018-01-12 | Discharge: 2018-01-13 | Disposition: A | Discharge status: Home / Self Care | Payer: Medicare Other | Attending: Emergency Medicine | Admitting: Emergency Medicine

## 2018-01-12 ENCOUNTER — Other Ambulatory Visit: Payer: Self-pay

## 2018-01-12 DIAGNOSIS — Z21 Asymptomatic human immunodeficiency virus [HIV] infection status: Secondary | ICD-10-CM | POA: Insufficient documentation

## 2018-01-12 DIAGNOSIS — Y999 Unspecified external cause status: Secondary | ICD-10-CM | POA: Insufficient documentation

## 2018-01-12 DIAGNOSIS — H9202 Otalgia, left ear: Secondary | ICD-10-CM | POA: Diagnosis not present

## 2018-01-12 DIAGNOSIS — M542 Cervicalgia: Secondary | ICD-10-CM | POA: Diagnosis not present

## 2018-01-12 DIAGNOSIS — Z79899 Other long term (current) drug therapy: Secondary | ICD-10-CM | POA: Insufficient documentation

## 2018-01-12 DIAGNOSIS — Y9389 Activity, other specified: Secondary | ICD-10-CM | POA: Insufficient documentation

## 2018-01-12 DIAGNOSIS — S0083XA Contusion of other part of head, initial encounter: Secondary | ICD-10-CM | POA: Insufficient documentation

## 2018-01-12 DIAGNOSIS — Y929 Unspecified place or not applicable: Secondary | ICD-10-CM | POA: Diagnosis not present

## 2018-01-12 DIAGNOSIS — Z85038 Personal history of other malignant neoplasm of large intestine: Secondary | ICD-10-CM | POA: Diagnosis not present

## 2018-01-12 DIAGNOSIS — S0993XA Unspecified injury of face, initial encounter: Secondary | ICD-10-CM | POA: Diagnosis not present

## 2018-01-12 DIAGNOSIS — R51 Headache: Secondary | ICD-10-CM | POA: Diagnosis not present

## 2018-01-12 DIAGNOSIS — F17210 Nicotine dependence, cigarettes, uncomplicated: Secondary | ICD-10-CM | POA: Diagnosis not present

## 2018-01-12 DIAGNOSIS — R519 Headache, unspecified: Secondary | ICD-10-CM

## 2018-01-12 DIAGNOSIS — S199XXA Unspecified injury of neck, initial encounter: Secondary | ICD-10-CM | POA: Diagnosis not present
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
## 2018-01-13 ENCOUNTER — Emergency Department (HOSPITAL_COMMUNITY): Payer: Medicare Other

## 2018-01-13 DIAGNOSIS — S0993XA Unspecified injury of face, initial encounter: Secondary | ICD-10-CM | POA: Diagnosis not present

## 2018-01-13 DIAGNOSIS — S199XXA Unspecified injury of neck, initial encounter: Secondary | ICD-10-CM | POA: Diagnosis not present

## 2018-01-13 DIAGNOSIS — S0083XA Contusion of other part of head, initial encounter: Secondary | ICD-10-CM | POA: Diagnosis not present

## 2018-01-13 MED ORDER — NAPROXEN 250 MG PO TABS
500.0000 mg | ORAL_TABLET | Freq: Once | ORAL | Status: AC
  Administered 2018-01-13: 500 mg via ORAL
  Filled 2018-01-13: qty 2

## 2018-01-13 MED ORDER — ACETAMINOPHEN 500 MG PO TABS: 500.0000 mg | ORAL_TABLET | Freq: Four times a day (QID) | ORAL | 0 refills | Status: DC | PRN

## 2018-01-13 NOTE — Discharge Instructions (Signed)
Apply ice to areas of pain to limit swelling.  Take Tylenol as needed for pain.  Follow-up with your primary care doctor regarding your visit today.  Your imaging in the ED was reassuring.  You may return for new or concerning symptoms.

## 2018-01-16 ENCOUNTER — Telehealth: Payer: Self-pay | Admitting: Nurse Practitioner

## 2018-01-16 ENCOUNTER — Encounter: Payer: Self-pay | Admitting: Nurse Practitioner

## 2018-01-16 NOTE — Telephone Encounter (Signed)
New referral received from Dr. Johnnye Sima for colon cancer. Pt has been scheduled to see Wilnette Kales on 8/5 at 130pm. Pt has been made to aware to arrive 30 minutes early. Letter mailed.

## 2018-01-23 ENCOUNTER — Encounter (HOSPITAL_COMMUNITY): Payer: Self-pay

## 2018-01-23 ENCOUNTER — Emergency Department (HOSPITAL_COMMUNITY): Payer: Medicare Other

## 2018-01-23 ENCOUNTER — Emergency Department (HOSPITAL_COMMUNITY)
Admission: EM | Admit: 2018-01-23 | Discharge: 2018-01-24 | Disposition: A | Discharge status: Home / Self Care | Payer: Medicare Other | Attending: Emergency Medicine | Admitting: Emergency Medicine

## 2018-01-23 ENCOUNTER — Other Ambulatory Visit: Payer: Self-pay

## 2018-01-23 DIAGNOSIS — B20 Human immunodeficiency virus [HIV] disease: Secondary | ICD-10-CM | POA: Insufficient documentation

## 2018-01-23 DIAGNOSIS — R072 Precordial pain: Secondary | ICD-10-CM | POA: Diagnosis not present

## 2018-01-23 DIAGNOSIS — F17210 Nicotine dependence, cigarettes, uncomplicated: Secondary | ICD-10-CM | POA: Insufficient documentation

## 2018-01-23 DIAGNOSIS — Z79899 Other long term (current) drug therapy: Secondary | ICD-10-CM | POA: Insufficient documentation

## 2018-01-23 DIAGNOSIS — R079 Chest pain, unspecified: Secondary | ICD-10-CM | POA: Diagnosis not present

## 2018-01-23 DIAGNOSIS — R0602 Shortness of breath: Secondary | ICD-10-CM | POA: Diagnosis not present

## 2018-01-23 DIAGNOSIS — B1710 Acute hepatitis C without hepatic coma: Secondary | ICD-10-CM | POA: Diagnosis not present

## 2018-01-23 DIAGNOSIS — J189 Pneumonia, unspecified organism: Secondary | ICD-10-CM | POA: Diagnosis not present

## 2018-01-23 LAB — I-STAT TROPONIN, ED: TROPONIN I, POC: 0 ng/mL (ref 0.00–0.08)

## 2018-01-23 LAB — BASIC METABOLIC PANEL
ANION GAP: 6 (ref 5–15)
BUN: 9 mg/dL (ref 8–23)
CO2: 32 mmol/L (ref 22–32)
Calcium: 9.6 mg/dL (ref 8.9–10.3)
Chloride: 105 mmol/L (ref 98–111)
Creatinine, Ser: 0.77 mg/dL (ref 0.44–1.00)
Glucose, Bld: 161 mg/dL — ABNORMAL HIGH (ref 70–99)
POTASSIUM: 4.8 mmol/L (ref 3.5–5.1)
SODIUM: 143 mmol/L (ref 135–145)

## 2018-01-23 LAB — CBC
HEMATOCRIT: 43.7 % (ref 36.0–46.0)
HEMOGLOBIN: 13.6 g/dL (ref 12.0–15.0)
MCH: 32 pg (ref 26.0–34.0)
MCHC: 31.1 g/dL (ref 30.0–36.0)
MCV: 102.8 fL — AB (ref 78.0–100.0)
PLATELETS: 109 10*3/uL — AB (ref 150–400)
RBC: 4.25 MIL/uL (ref 3.87–5.11)
RDW: 13.5 % (ref 11.5–15.5)
WBC: 4.1 10*3/uL (ref 4.0–10.5)

## 2018-01-23 MED ORDER — PREDNISONE 20 MG PO TABS
60.0000 mg | ORAL_TABLET | Freq: Once | ORAL | Status: AC
Start: 2018-01-24 — End: 2018-01-24
  Administered 2018-01-24: 60 mg via ORAL
  Filled 2018-01-23: qty 3

## 2018-01-23 MED ORDER — DOXYCYCLINE HYCLATE 100 MG PO CAPS: 100.0000 mg | ORAL_CAPSULE | Freq: Two times a day (BID) | ORAL | 0 refills | Status: DC

## 2018-01-23 MED ORDER — IOPAMIDOL (ISOVUE-370) INJECTION 76%
INTRAVENOUS | Status: AC
  Filled 2018-01-23: qty 100

## 2018-01-23 MED ORDER — ALBUTEROL SULFATE HFA 108 (90 BASE) MCG/ACT IN AERS
2.0000 | INHALATION_SPRAY | Freq: Once | RESPIRATORY_TRACT | Status: AC
  Administered 2018-01-24: 2 via RESPIRATORY_TRACT
  Filled 2018-01-23: qty 6.7

## 2018-01-23 MED ORDER — HYDROCODONE-ACETAMINOPHEN 5-325 MG PO TABS
1.0000 | ORAL_TABLET | Freq: Once | ORAL | Status: AC
  Administered 2018-01-23: 1 via ORAL
  Filled 2018-01-23: qty 1

## 2018-01-23 MED ORDER — IBUPROFEN 400 MG PO TABS
400.0000 mg | ORAL_TABLET | Freq: Once | ORAL | Status: AC
  Administered 2018-01-23: 400 mg via ORAL
  Filled 2018-01-23: qty 1

## 2018-01-23 MED ORDER — PREDNISONE 20 MG PO TABS: ORAL_TABLET | ORAL | 0 refills | Status: DC

## 2018-01-23 MED ORDER — IOPAMIDOL (ISOVUE-370) INJECTION 76%
100.0000 mL | Freq: Once | INTRAVENOUS | Status: AC | PRN
  Administered 2018-01-23: 100 mL via INTRAVENOUS

## 2018-01-23 MED ORDER — DOXYCYCLINE HYCLATE 100 MG PO TABS
100.0000 mg | ORAL_TABLET | Freq: Once | ORAL | Status: AC
  Administered 2018-01-24: 100 mg via ORAL
  Filled 2018-01-23: qty 1
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
## 2018-01-24 DIAGNOSIS — J189 Pneumonia, unspecified organism: Secondary | ICD-10-CM | POA: Diagnosis not present

## 2018-01-24 NOTE — ED Provider Notes (Signed)
[facility] EMERGENCY DEPARTMENT Provider Note   CSN: [id number] Arrival date & time: 01/23/18  1828     History   Chief Complaint Chief Complaint  Patient presents with  . Chest Pain    HPI Ruth Stephenson is a 67 y.o. female.  The history is provided by the patient.  Chest Pain   This is a new problem. The current episode started more than 1 week ago. The problem occurs constantly. The problem has been gradually worsening. The pain is present in the substernal region. The pain is moderate. The quality of the pain is described as sharp. The pain does not radiate. Associated symptoms include cough.    Past Medical History:  Diagnosis Date  . Cancer ([place])   . Hepatitis C   . HIV (human immunodeficiency virus infection) [facility])     Patient Active Problem List   Diagnosis Date Noted  . Altered mental status   . Polysubstance abuse ([place])   . Acute respiratory failure with hypoxia and hypercapnia ([facility])   . Overdose 05/04/2017  . Bilateral edema of lower extremity 07/29/2014  . History of colon cancer, stage II 07/29/2014  . Neuropathy 05/27/2014  . GERD (gastroesophageal reflux disease) 05/27/2014  . Cancer of right colon ([place]) 10/24/2012  . History of pancreatitis 09/19/2012  . HIV (human immunodeficiency virus infection) ([place]) 09/19/2012  . Hepatitis C 09/19/2012  . Tobacco abuse 09/19/2012  . Anemia 09/19/2012    Past Surgical History:  Procedure Laterality Date  . CARPAL TUNNEL RELEASE Left 01/19/2014   Procedure: LEFT CARPAL TUNNEL RELEASE;  Surgeon: Schuyler Amor, MD;  Location: [facility];  Service: Orthopedics;  Laterality: Left;  . COLON RESECTION N/A 09/23/2012   Procedure: LAPAROSCOPIC RIGHT COLON RESECTION   ;  Surgeon: Edward Jolly, MD;  Location: [facility];  Service: General;  Laterality: N/A;  LAPOROSCOPY, RIGHT HEMICOLECTOMY  . COLON SURGERY  09/23/2012   lap right colectomy  . COLONOSCOPY N/A 09/20/2012   Procedure: COLONOSCOPY;  Surgeon: Beryle Beams, MD;  Location: [facility];  Service: Endoscopy;  Laterality: N/A;  . OPEN REDUCTION INTERNAL FIXATION (ORIF) DISTAL RADIAL FRACTURE Left 01/19/2014   Procedure: OPEN REDUCTION INTERNAL FIXATION (ORIF) LEFT  DISTAL RADIAL FRACTURE;  Surgeon: Schuyler Amor, MD;  Location: [facility];  Service: Orthopedics;  Laterality: Left;     OB History   None      Home Medications    Prior to Admission medications   Medication Sig Start Date End Date Taking? Authorizing Provider  acetaminophen (TYLENOL) 500 MG tablet Take 1 tablet (500 mg total) by mouth every 6 (six) hours as needed for mild pain, moderate pain or headache. 01/13/18  Yes Antonietta Breach, PA-C  albuterol (PROVENTIL HFA;VENTOLIN HFA) 108 (90 Base) MCG/ACT inhaler Inhale 2 puffs every 2 (two) hours as needed into the lungs for wheezing or shortness of breath.   Yes [provider]  Darunavir-Cobicisctat-Emtricitabine-Tenofovir Alafenamide (SYMTUZA) 800-150-200-10 MG TABS Take 1 tablet by mouth daily with breakfast. 01/11/18  Yes Campbell Riches, MD  methadone (DOLOPHINE) 10 MG/ML solution Take 80 mg by mouth daily.    Yes [provider]  doxycycline (VIBRAMYCIN) 100 MG capsule Take 1 capsule (100 mg total) by mouth 2 (two) times daily. One po bid x 7 days 01/23/18   Yoshiaki Kreuser, Corene Cornea, MD  predniSONE (DELTASONE) 20 MG tablet 2 tabs po daily x 4 days 01/23/18   Adana Marik, Corene Cornea, MD  sulfamethoxazole-trimethoprim (BACTRIM)  400-80 MG tablet Take 1 tablet by mouth daily. Patient not taking: Reported on 01/23/2018 07/11/17   Campbell Riches, MD    Family History Family History  Problem Relation Age of Onset  . Cancer Mother     Social History Social History   Tobacco Use  . Smoking status: Current Some Day Smoker    Packs/day: 0.50    Types: Cigarettes  . Smokeless tobacco: Never Used  . Tobacco comment: trying to cut back  Substance Use Topics  .  Alcohol use: Yes    Comment: Occasionally   . Drug use: Not Currently    Types: Cocaine, Marijuana    Comment: Heroin      Allergies   Patient has no known allergies.   Review of Systems Review of Systems  Respiratory: Positive for cough.   Cardiovascular: Positive for chest pain.  All other systems reviewed and are negative.    Physical Exam Updated Vital Signs BP 100/63   Pulse (!) 42   Temp 98.4 F (36.9 C) (Oral)   Resp 14   Ht 5\' 3"  (1.6 m)   Wt 61.7 kg (136 lb)   SpO2 95%   BMI 24.09 kg/m   Physical Exam  Constitutional: She appears well-developed and well-nourished.  HENT:  Head: Normocephalic and atraumatic.  Neck: Normal range of motion.  Cardiovascular: Normal rate and regular rhythm.  Pulmonary/Chest: No stridor. No respiratory distress. She has decreased breath sounds. She has no wheezes.  Abdominal: She exhibits no distension.  Musculoskeletal:  ttp over left anterior ribs  Neurological: She is alert.  Skin: Skin is warm and dry.  Nursing note and vitals reviewed.    ED Treatments / Results  Labs (all labs ordered are listed, but only abnormal results are displayed) Labs Reviewed  BASIC METABOLIC PANEL - Abnormal; Notable for the following components:      Result Value   Glucose, Bld 161 (*)    All other components within normal limits  CBC - Abnormal; Notable for the following components:   MCV 102.8 (*)    Platelets 109 (*)    All other components within normal limits  I-STAT TROPONIN, ED    EKG EKG Interpretation  Date/Time:  Wednesday January 23 2018 18:32:47 EDT Ventricular Rate:  63 PR Interval:  110 QRS Duration: 94 QT Interval:  332 QTC Calculation: 339 R Axis:   86 Text Interpretation:  Sinus rhythm with short PR Left ventricular hypertrophy Nonspecific ST and T wave abnormality Abnormal ECG Confirmed by Merrily Pew [phone]) on 01/23/2018 9:12:39 PM Also confirmed by Merrily Pew [phone]), editor Philomena Doheny [phone])  on  01/24/2018 7:52:38 AM   Radiology Dg Chest 2 View  Result Date: 01/23/2018 CLINICAL DATA:  Chest pain and shortness of breath EXAM: CHEST - 2 VIEW COMPARISON:  02/22/2017 FINDINGS: Lungs are hyperexpanded. Interstitial markings are diffusely coarsened with chronic features. Ill-defined nodular density identified in the left mid lung, superimposed on the anterior left fourth rib. The cardiopericardial silhouette is within normal limits for size. The visualized bony structures of the thorax are intact. IMPRESSION: 1. Irregular ill-defined nodular density in the left mid lung. CT chest recommended to further evaluate. 2. Emphysema. Electronically Signed   By: Misty Stanley M.D.   On: 01/23/2018 19:46   Ct Angio Chest Pe W And/or Wo Contrast  Result Date: 01/23/2018 CLINICAL DATA:  Chest pain, shortness of breath, abnormal chest radiograph EXAM: CT ANGIOGRAPHY CHEST WITH CONTRAST TECHNIQUE: Multidetector CT imaging of the  chest was performed using the standard protocol during bolus administration of intravenous contrast. Multiplanar CT image reconstructions and MIPs were obtained to evaluate the vascular anatomy. CONTRAST:  58mL ISOVUE-370 IOPAMIDOL (ISOVUE-370) INJECTION 76% COMPARISON:  Chest radiographs dated 01/23/2018 FINDINGS: Cardiovascular: Satisfactory opacification of the bilateral pulmonary arteries to the segmental level. No evidence pulmonary embolism. No evidence of thoracic aortic aneurysm. Mild atherosclerotic calcifications of the aortic arch. The heart is normal in size.  No pericardial effusion. Mediastinum/Nodes: No suspicious mediastinal lymphadenopathy. Visualized thyroid is unremarkable. Lungs/Pleura: Traction bronchiectasis in the medial left upper lobe. Multifocal patchy/nodular opacities in the left upper lobe, including an irregular lingular opacity which corresponds to the radiographic abnormality (series 6/image 69), suspicious for pneumonia. Additional patchy/nodular opacities in  the posterior right upper lobe, also likely infectious. Mild centrilobular and paraseptal emphysematous changes, upper lobe predominant. No pleural effusion or pneumothorax. Upper Abdomen: Visualized upper abdomen is notable for a nodular hepatic contour, suggesting cirrhosis. Musculoskeletal: Mild degenerative changes of the mid cervical spine. Review of the MIP images confirms the above findings. IMPRESSION: No evidence of pulmonary embolism. Multifocal patchy/nodular opacities in the bilateral upper lobes, suggesting multifocal pneumonia. Consider follow-up CT chest in 3 months to document clearance. Aortic Atherosclerosis (ICD10-I70.0) and Emphysema (ICD10-J43.9). Electronically Signed   By: Julian Hy M.D.   On: 01/23/2018 23:48    Procedures Procedures (including critical care time)  Medications Ordered in ED Medications  HYDROcodone-acetaminophen (NORCO/VICODIN) 5-325 MG per tablet 1 tablet (1 tablet Oral Given 01/23/18 2151)  ibuprofen (ADVIL,MOTRIN) tablet 400 mg (400 mg Oral Given 01/23/18 2151)  iopamidol (ISOVUE-370) 76 % injection 100 mL (100 mLs Intravenous Contrast Given 01/23/18 2328)  albuterol (PROVENTIL HFA;VENTOLIN HFA) 108 (90 Base) MCG/ACT inhaler 2 puff (2 puffs Inhalation Given 01/24/18 0019)  predniSONE (DELTASONE) tablet 60 mg (60 mg Oral Given 01/24/18 0019)  doxycycline (VIBRA-TABS) tablet 100 mg (100 mg Oral Given 01/24/18 0020)     Initial Impression / Assessment and Plan / ED Course  I have reviewed the triage vital signs and the nursing notes.  Pertinent labs & imaging results that were available during my care of the patient were reviewed by me and considered in my medical decision making (see chart for details).   Work-up without any traumatic injury to the chest.  Low suspicion for ACS.  No evidence of PE.  Was found to have consolidations bilateral upper lobes consistent with possible pneumonia.  She does have a cough and has a history of smoking so we will  treat for the same.  She did sustain trauma to their week and half ago so could be resolving pulmonary contusions as well.  Pain medication provided.  Stable for discharge.  Final Clinical Impressions(s) / ED Diagnoses   Final diagnoses:  Multifocal pneumonia    ED Discharge Orders        Ordered    doxycycline (VIBRAMYCIN) 100 MG capsule  2 times daily     01/23/18 2354    predniSONE (DELTASONE) 20 MG tablet     01/23/18 2354       Sondra Blixt, Corene Cornea, MD 01/24/18 2322

## 2018-01-24 NOTE — ED Notes (Signed)
Patient verbalizes understanding of medications and discharge instructions. No further questions at this time. VSS and patient ambulatory at discharge.   

## 2018-01-28 ENCOUNTER — Encounter: Payer: Self-pay | Admitting: Nurse Practitioner

## 2018-01-28 ENCOUNTER — Inpatient Hospital Stay: Payer: Medicare Other | Attending: Nurse Practitioner | Admitting: Nurse Practitioner

## 2018-01-28 VITALS — BP 118/76 | HR 58 | Temp 98.5°F | Resp 17 | Ht 63.0 in | Wt 130.1 lb

## 2018-01-28 DIAGNOSIS — G629 Polyneuropathy, unspecified: Secondary | ICD-10-CM | POA: Diagnosis not present

## 2018-01-28 DIAGNOSIS — B1920 Unspecified viral hepatitis C without hepatic coma: Secondary | ICD-10-CM | POA: Diagnosis not present

## 2018-01-28 DIAGNOSIS — R599 Enlarged lymph nodes, unspecified: Secondary | ICD-10-CM | POA: Diagnosis not present

## 2018-01-28 DIAGNOSIS — K766 Portal hypertension: Secondary | ICD-10-CM | POA: Diagnosis not present

## 2018-01-28 DIAGNOSIS — F17210 Nicotine dependence, cigarettes, uncomplicated: Secondary | ICD-10-CM | POA: Diagnosis not present

## 2018-01-28 DIAGNOSIS — Z809 Family history of malignant neoplasm, unspecified: Secondary | ICD-10-CM | POA: Insufficient documentation

## 2018-01-28 DIAGNOSIS — Z79899 Other long term (current) drug therapy: Secondary | ICD-10-CM | POA: Insufficient documentation

## 2018-01-28 DIAGNOSIS — B20 Human immunodeficiency virus [HIV] disease: Secondary | ICD-10-CM | POA: Diagnosis not present

## 2018-01-28 DIAGNOSIS — Z85038 Personal history of other malignant neoplasm of large intestine: Secondary | ICD-10-CM | POA: Diagnosis present

## 2018-01-28 DIAGNOSIS — J439 Emphysema, unspecified: Secondary | ICD-10-CM

## 2018-01-28 DIAGNOSIS — K7460 Unspecified cirrhosis of liver: Secondary | ICD-10-CM | POA: Diagnosis not present

## 2018-01-28 DIAGNOSIS — C182 Malignant neoplasm of ascending colon: Secondary | ICD-10-CM

## 2018-01-28 DIAGNOSIS — F1910 Other psychoactive substance abuse, uncomplicated: Secondary | ICD-10-CM | POA: Diagnosis not present

## 2018-01-28 DIAGNOSIS — M50321 Other cervical disc degeneration at C4-C5 level: Secondary | ICD-10-CM | POA: Diagnosis not present

## 2018-01-28 DIAGNOSIS — I87009 Postthrombotic syndrome without complications of unspecified extremity: Secondary | ICD-10-CM | POA: Diagnosis not present

## 2018-01-28 DIAGNOSIS — R634 Abnormal weight loss: Secondary | ICD-10-CM | POA: Diagnosis not present

## 2018-01-28 DIAGNOSIS — R590 Localized enlarged lymph nodes: Secondary | ICD-10-CM
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
## 2018-01-30 ENCOUNTER — Telehealth: Payer: Self-pay | Admitting: Emergency Medicine

## 2018-01-30 ENCOUNTER — Telehealth: Payer: Self-pay | Admitting: Hematology
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
## 2018-01-30 NOTE — Telephone Encounter (Signed)
Referral to Dr Benson [place] per 8/5 los/ Referral entered into proficient as well

## 2018-01-30 NOTE — Telephone Encounter (Addendum)
Tonya (scheduler) made aware of GI referral. CS made aware to call pt to schedule  MRI . Will follow up   ----- Message from Alla Feeling, NP sent at 01/30/2018 12:21 PM EDT ----- Please help schedule MRI abdomen within next 2 weeks or so, and f/u on GI referral.  Thanks, Regan Rakers

## 2018-02-04 ENCOUNTER — Inpatient Hospital Stay: Admission: RE | Admit: 2018-02-04 | Payer: Medicare Other | Source: Ambulatory Visit

## 2018-02-11 MED FILL — SYMTUZA 800-150-200-10 MG T: 800-150-200 | 30 days supply | Qty: 30 | Fill #1

## 2018-03-14 ENCOUNTER — Telehealth: Payer: Self-pay

## 2018-03-14 MED FILL — SYMTUZA 800-150-200-10 MG T: 800-150-200 | 30 days supply | Qty: 30 | Fill #2

## 2018-03-14 NOTE — Telephone Encounter (Signed)
Left voice message for this patient regarding follow up from last appointment with Cira Rue NP.  At that time we wanted to get a MRI of her liver and want to know if she is agreeable to this.  Requested she call back and let us know.

## 2018-03-28 ENCOUNTER — Other Ambulatory Visit: Payer: Medicare Other

## 2018-04-11 MED FILL — SYMTUZA 800-150-200-10 MG T: 800-150-200 | 30 days supply | Qty: 30 | Fill #3

## 2018-04-15 ENCOUNTER — Encounter: Payer: Medicare Other | Admitting: Infectious Diseases

## 2018-05-01 ENCOUNTER — Telehealth: Payer: Self-pay

## 2018-05-01 NOTE — Telephone Encounter (Signed)
Patient called to rechedule recent missed lab/office visit appointments. Left generic voice mail for patient to return call at [phone] also sent patient Mychart message.

## 2018-05-03 ENCOUNTER — Other Ambulatory Visit: Payer: Self-pay | Admitting: Infectious Diseases

## 2018-05-03 DIAGNOSIS — B20 Human immunodeficiency virus [HIV] disease: Secondary | ICD-10-CM

## 2018-05-07 MED FILL — SYMTUZA 800-150-200-10 MG T: 800-150-200 | 30 days supply | Qty: 30 | Fill #0

## 2018-05-27 ENCOUNTER — Other Ambulatory Visit: Payer: Medicare Other

## 2018-06-07 ENCOUNTER — Telehealth: Payer: Self-pay | Admitting: *Deleted

## 2018-06-07 ENCOUNTER — Ambulatory Visit: Payer: Medicare Other | Admitting: Infectious Diseases

## 2018-06-07 NOTE — Telephone Encounter (Signed)
Patient no-showed today's visit, has a history of missed visits and detectable virus. Referred to [facility]. Landis Gandy, RN

## 2018-07-09 ENCOUNTER — Other Ambulatory Visit: Payer: Medicare Other

## 2018-07-25 ENCOUNTER — Telehealth: Payer: Self-pay | Admitting: Pharmacist

## 2018-07-25 NOTE — Telephone Encounter (Signed)
[employer] and myself are unable to reach [facility] for refills again. She last filled on 11/8.

## 2018-07-30 ENCOUNTER — Encounter: Payer: Medicare Other | Admitting: Infectious Diseases

## 2018-09-09 ENCOUNTER — Ambulatory Visit (INDEPENDENT_AMBULATORY_CARE_PROVIDER_SITE_OTHER): Payer: Medicare Other | Admitting: Infectious Diseases

## 2018-09-09 ENCOUNTER — Telehealth: Payer: Self-pay

## 2018-09-09 ENCOUNTER — Telehealth: Payer: Self-pay | Admitting: Pharmacy Technician

## 2018-09-09 ENCOUNTER — Ambulatory Visit
Admission: RE | Admit: 2018-09-09 | Discharge: 2018-09-09 | Disposition: A | Discharge status: Home / Self Care | Payer: Medicare Other | Source: Ambulatory Visit | Attending: Infectious Diseases | Admitting: Infectious Diseases

## 2018-09-09 ENCOUNTER — Other Ambulatory Visit: Payer: Self-pay

## 2018-09-09 ENCOUNTER — Encounter: Payer: Self-pay | Admitting: Infectious Diseases

## 2018-09-09 VITALS — BP 124/70 | HR 84 | Temp 99.4°F

## 2018-09-09 DIAGNOSIS — R509 Fever, unspecified: Secondary | ICD-10-CM | POA: Diagnosis not present

## 2018-09-09 DIAGNOSIS — R05 Cough: Secondary | ICD-10-CM | POA: Diagnosis not present

## 2018-09-09 DIAGNOSIS — R059 Cough, unspecified: Secondary | ICD-10-CM

## 2018-09-09 DIAGNOSIS — R0602 Shortness of breath: Secondary | ICD-10-CM | POA: Diagnosis not present

## 2018-09-09 DIAGNOSIS — B20 Human immunodeficiency virus [HIV] disease: Secondary | ICD-10-CM

## 2018-09-09 MED ORDER — SULFAMETHOXAZOLE-TRIMETHOPRIM 400-80 MG PO TABS: 1.0000 | ORAL_TABLET | Freq: Every day | ORAL | 3 refills | Status: DC

## 2018-09-09 MED ORDER — OSELTAMIVIR PHOSPHATE 75 MG PO CAPS: 75.0000 mg | ORAL_CAPSULE | Freq: Two times a day (BID) | ORAL | 0 refills | Status: AC

## 2018-09-09 MED ORDER — DOXYCYCLINE HYCLATE 100 MG PO TABS: 100.0000 mg | ORAL_TABLET | Freq: Two times a day (BID) | ORAL | 0 refills | Status: DC

## 2018-09-09 MED ORDER — DARUN-COBIC-EMTRICIT-TENOFAF 800-150-200-10 MG PO TABS: 1.0000 | ORAL_TABLET | Freq: Every day | ORAL | 3 refills | Status: DC

## 2018-09-09 MED FILL — SULFAMETHOXAZOLE-TMP SS TAB: 400-80 | 30 days supply | Qty: 30 | Fill #0

## 2018-09-09 MED FILL — DOXYCYCLINE HYCLATE 100 MG: 100 | 5 days supply | Qty: 10 | Fill #0

## 2018-09-09 MED FILL — SYMTUZA 800-150-200-10 MG T: 800-150-200 | 30 days supply | Qty: 30 | Fill #0

## 2018-09-09 NOTE — Patient Instructions (Signed)
I suspect you have the flu. Will swab your nose today to see.   For your fevers/aches/pain - please take Tylenol per box instructions. Can use Ibuprofen if unrelieved.   Please go home and rest, drink plenty of fluids.   Please stop by [facility] before you leave today for a chest x-ray. I don't hear any pneumonia but would like to check to be sure.  The pain could be due to the frequent coughing and irritation of the muscles.   Will send in Tamiflu for you - take one capsule twice a day for 5 days until gone. This can be picked up at [facility]   Please call back if you do not have improvement in symptoms by the end of the week.   Please schedule a follow up with Dr. Johnnye Sima in 1 month for HIV follow up care please.

## 2018-09-09 NOTE — Telephone Encounter (Signed)
[facility] Patient Advocate Encounter    Findings of the benefits investigation conducted this morning via test claims for the patient's upcoming appointment on 09/09/2018 @ 10:45am are as follows:   Insurance: Medicare active Test run with drug historically used, will run another test claim if new drug prescribed $3.90 (Symtuza) Estimated copay amount: $3.90 Has grant to cover the copay with PAF  Last filled at [facility] in November.   Bartholomew Crews, CPhT Specialty Pharmacy Patient [facility] Phone: [phone] Fax: [phone] 09/09/2018 10:40 AM

## 2018-09-09 NOTE — Assessment & Plan Note (Signed)
Despite her reassurance about taking her medications we called [facility] and she has not had fill since November. Will have her check VL/CD4 today. I updated her phone # and resent prescriptions in. She can arrange to go there today to pick up medications.  She needs to come back for appointment with Dr. Johnnye Sima - would like to see her back sometime over the next 3 months to check in for adherence again.

## 2018-09-09 NOTE — Assessment & Plan Note (Signed)
Acute onset febrile illness with cough. I suspect this is more likely related to bacterial pneumonia but will need to rule out flu. Check rapid flu here and send in Tamiflu + doxycycline for her. Tylenol for antipyretics.

## 2018-09-09 NOTE — Progress Notes (Signed)
CXR indicating pneumonia LLL. On doxycycline. Will need follow up CXR in the future. H/O L lung nodule from previous films.

## 2018-09-09 NOTE — Telephone Encounter (Signed)
Thank you Adonis Brook. I updated the patient's phone number so they can contact her to verify for future shipments.

## 2018-09-09 NOTE — Assessment & Plan Note (Signed)
I suspect she has community acquired bacterial pneumonia. She is non-toxic, non-hypoxic and normotensive. Will start doxycycline 100 mg BID x 5d, check CXR and treat outpatient. Discussed supportive care recommendations as well.  Precautions given with regards to staying home to monitor symptoms, seek care if she goes on to develop more severe difficulty breathing.  Low r/f for COVID-19 and with rapid onset also makes it less likely.

## 2018-09-09 NOTE — Progress Notes (Signed)
Name: Ruth Stephenson  DOB: June 07, 1951 MRN: [id number] PCP: Patient, No Pcp Per    Patient Active Problem List   Diagnosis Date Noted  . Fever 09/09/2018  . Cough 09/09/2018  . Altered mental status   . Polysubstance abuse ([place])   . Overdose 05/04/2017  . Bilateral edema of lower extremity 07/29/2014  . History of colon cancer, stage II 07/29/2014  . Neuropathy 05/27/2014  . GERD (gastroesophageal reflux disease) 05/27/2014  . Cancer of right colon ([place]) 10/24/2012  . History of pancreatitis 09/19/2012  . Human immunodeficiency virus (HIV) disease (Ruth Stephenson) 09/19/2012  . Hepatitis C 09/19/2012  . Tobacco abuse 09/19/2012  . Anemia 09/19/2012     Subjective:  Ruth Stephenson is a 68 y.o. woman with HIV disease, under intermittent control. Has missed several visits with Dr. Johnnye Sima.   Chief Complaint  Patient presents with  . Cough    Fever +3 days, achy and ribs hurt    HPI:  She says she was in normal health 4 days ago. She very quickly and abruptly developed a cough with phlegm, body aches, malaise and poor appetite. She progressed to develop left sided rib pain with deep inspiration that started on day 2 of illness and subjective fevers/sweating on day 3.  She still has body aches and some shortness of breath. She has for the symptoms taken about 2 doses of mucinex with mild effect. She has no history of travel or contact with known ill people aside from her grand daughter (who is an infant). She was sick for about a day or two with runny nose and cough and subsided on it's own.   She says she took her last dose of symtuza today and will not be able to get her medications easily d/t transportation trouble. She assures me she has been taking this but is not taking bactrim.    Review of Systems  Constitutional: Positive for chills, diaphoresis, fever and malaise/fatigue.  HENT: Negative for congestion, ear pain, sinus pain and sore throat.   Eyes: Positive for blurred vision.   Respiratory: Positive for cough, sputum production and shortness of breath. Negative for wheezing.   Cardiovascular: Positive for chest pain. Negative for leg swelling.  Gastrointestinal: Negative for abdominal pain, diarrhea, nausea and vomiting.       Poor appetite  Genitourinary: Negative for dysuria.  Musculoskeletal: Positive for myalgias.  Skin: Negative for rash.  Neurological: Negative for headaches.    Past Medical History:  Diagnosis Date  . Cancer (Ruth Stephenson)   . Hepatitis C   . HIV (human immunodeficiency virus infection) (Ruth Stephenson)     Outpatient Medications Prior to Visit  Medication Sig Dispense Refill  . acetaminophen (TYLENOL) 500 MG tablet Take 1 tablet (500 mg total) by mouth every 6 (six) hours as needed for mild pain, moderate pain or headache. 30 tablet 0  . albuterol (PROVENTIL HFA;VENTOLIN HFA) 108 (90 Base) MCG/ACT inhaler Inhale 2 puffs every 2 (two) hours as needed into the lungs for wheezing or shortness of breath.    . methadone (DOLOPHINE) 10 MG/ML solution Take 80 mg by mouth daily.     . predniSONE (DELTASONE) 20 MG tablet 2 tabs po daily x 4 days 8 tablet 0  . doxycycline (VIBRAMYCIN) 100 MG capsule Take 1 capsule (100 mg total) by mouth 2 (two) times daily. One po bid x 7 days 14 capsule 0  . sulfamethoxazole-trimethoprim (BACTRIM) 400-80 MG tablet Take 1 tablet by mouth daily. 30 tablet 3  .  SYMTUZA 800-150-200-10 MG TABS TAKE 1 TABLET BY MOUTH DAILY WITH BREAKFAST. 30 tablet 0   No facility-administered medications prior to visit.      No Known Allergies  Social History   Tobacco Use  . Smoking status: Current Some Day Smoker    Packs/day: 0.50    Years: 35.00    Pack years: 17.50    Types: Cigarettes  . Smokeless tobacco: Never Used  . Tobacco comment: trying to cut back; 4-5 cigarettes per day   Substance Use Topics  . Alcohol use: Not Currently    Comment: Occasionally; none currently as of 01/28/18  . Drug use: Not Currently    Types: Cocaine,  Marijuana    Comment: Heroin; in outpatient drug proram (reported on 01/28/18 visit     Family History  Problem Relation Age of Onset  . Cancer Mother     Social History   Substance and Sexual Activity  Sexual Activity Not on file   Comment: declined condoms     Objective:   Vitals:   09/09/18 1034  BP: 124/70  Pulse: 84  Temp: 99.4 F (37.4 C)  TempSrc: Oral  SpO2: 95%   There is no height or weight on file to calculate BMI.  Physical Exam Constitutional:      General: She is not in acute distress.    Appearance: She is ill-appearing. She is not toxic-appearing.  HENT:     Nose: Nose normal. No congestion.     Mouth/Throat:     Mouth: Mucous membranes are moist.     Pharynx: Oropharynx is clear. No oropharyngeal exudate.  Eyes:     General: No scleral icterus.    Conjunctiva/sclera: Conjunctivae normal.     Pupils: Pupils are equal, round, and reactive to light.  Cardiovascular:     Rate and Rhythm: Normal rate and regular rhythm.     Pulses: Normal pulses.     Heart sounds: No murmur.  Pulmonary:     Effort: No respiratory distress.     Breath sounds: Rales (LLQ) present. No wheezing or rhonchi.  Chest:     Chest wall: Tenderness (LLQ anterior/lateral 5th/6th ICS. ) present.  Abdominal:     General: There is distension.     Tenderness: There is no abdominal tenderness.  Skin:    General: Skin is warm and dry.  Neurological:     Mental Status: She is alert.     Lab Results Lab Results  Component Value Date   WBC 4.1 01/23/2018   HGB 13.6 01/23/2018   HCT 43.7 01/23/2018   MCV 102.8 (H) 01/23/2018   PLT 109 (L) 01/23/2018    Lab Results  Component Value Date   CREATININE 0.77 01/23/2018   BUN 9 01/23/2018   NA 143 01/23/2018   K 4.8 01/23/2018   CL 105 01/23/2018   CO2 32 01/23/2018    Lab Results  Component Value Date   ALT 14 01/02/2018   AST 31 01/02/2018   ALKPHOS 69 05/08/2017   BILITOT 0.6 01/02/2018    Lab Results  Component  Value Date   CHOL 133 04/12/2016   HDL 52 04/12/2016   LDLCALC 67 04/12/2016   TRIG 68 04/12/2016   CHOLHDL 2.6 04/12/2016   HIV 1 RNA Quant (copies/mL)  Date Value  01/02/2018 782 (H)  07/11/2017 23 (H)  05/04/2017 200   CD4 T Cell Abs (/uL)  Date Value  01/02/2018 240 (L)  07/11/2017 210 (L)  04/05/2017 220 (L)  Assessment & Plan:   Problem List Items Addressed This Visit      Unprioritized   Human immunodeficiency virus (HIV) disease ([place]) - Primary    Despite her reassurance about taking her medications we called [facility] and she has not had fill since November. Will have her check VL/CD4 today. I updated her phone # and resent prescriptions in. She can arrange to go there today to pick up medications.  She needs to come back for appointment with Dr. Johnnye Sima - would like to see her back sometime over the next 3 months to check in for adherence again.        Relevant Medications   oseltamivir (TAMIFLU) 75 MG capsule   Darunavir-Cobicisctat-Emtricitabine-Tenofovir Alafenamide (SYMTUZA) 800-150-200-10 MG TABS   sulfamethoxazole-trimethoprim (BACTRIM) 400-80 MG tablet   Fever    Acute onset febrile illness with cough. I suspect this is more likely related to bacterial pneumonia but will need to rule out flu. Check rapid flu here and send in Tamiflu + doxycycline for her. Tylenol for antipyretics.       Relevant Orders   FLU A+B CULT, RAPID   Cough    I suspect she has community acquired bacterial pneumonia. She is non-toxic, non-hypoxic and normotensive. Will start doxycycline 100 mg BID x 5d, check CXR and treat outpatient. Discussed supportive care recommendations as well.  Precautions given with regards to staying home to monitor symptoms, seek care if she goes on to develop more severe difficulty breathing.  Low r/f for COVID-19 and with rapid onset also makes it less likely.       Relevant Orders   DG Chest 2 View (Completed)      Janene Madeira, MSN, NP-C  [facility] [place] Pager: [phone] Office: [phone]  09/09/18  4:12 PM

## 2018-09-09 NOTE — Telephone Encounter (Signed)
Almyra Free with [facility] called to report results of CXR Impression-COPD changes with left lower lobe and minimal right upper lobe infiltrate consistent with pneumonia.  Eugenia Mcalpine, LPN

## 2018-09-11 LAB — HIV-1 RNA QUANT-NO REFLEX-BLD
HIV 1 RNA QUANT: NOT DETECTED {copies}/mL
HIV-1 RNA Quant, Log: <1.3 Log copies/mL

## 2018-09-12 LAB — FLU A+B CULT, RAPID
MICRO NUMBER:: 327262
SPECIMEN QUALITY:: ADEQUATE

## 2018-10-04 MED FILL — SYMTUZA 800-150-200-10 MG T: 800-150-200 | 30 days supply | Qty: 30 | Fill #1

## 2018-10-04 MED FILL — SULFAMETHOXAZOLE-TMP SS TAB: 400-80 | 30 days supply | Qty: 30 | Fill #1

## 2018-10-31 MED FILL — SYMTUZA 800-150-200-10 MG T: 800-150-200 | 30 days supply | Qty: 30 | Fill #2

## 2018-10-31 MED FILL — SULFAMETHOXAZOLE-TMP SS TAB: 400-80 | 30 days supply | Qty: 30 | Fill #2

## 2018-11-27 MED FILL — SYMTUZA 800-150-200-10 MG T: 800-150-200 | 30 days supply | Qty: 30 | Fill #3

## 2018-11-27 MED FILL — SULFAMETHOXAZOLE-TMP SS TAB: 400-80 | 30 days supply | Qty: 30 | Fill #3

## 2019-01-09 ENCOUNTER — Telehealth: Payer: Self-pay | Admitting: Pharmacist

## 2019-01-09 NOTE — Telephone Encounter (Signed)
[facility] and the clinic pharmacy staff have been trying to contact patient for refills of Symtuza with no luck.  Patient last filled Symtuza on 11/27/2018.

## 2019-03-22 DIAGNOSIS — Z79899 Other long term (current) drug therapy: Secondary | ICD-10-CM | POA: Diagnosis not present

## 2019-03-22 DIAGNOSIS — R197 Diarrhea, unspecified: Secondary | ICD-10-CM | POA: Diagnosis not present

## 2019-03-22 DIAGNOSIS — I10 Essential (primary) hypertension: Secondary | ICD-10-CM | POA: Diagnosis not present

## 2019-03-22 DIAGNOSIS — R1084 Generalized abdominal pain: Secondary | ICD-10-CM | POA: Diagnosis not present

## 2019-03-22 DIAGNOSIS — R52 Pain, unspecified: Secondary | ICD-10-CM | POA: Diagnosis not present

## 2019-03-22 DIAGNOSIS — B20 Human immunodeficiency virus [HIV] disease: Secondary | ICD-10-CM | POA: Diagnosis not present

## 2019-03-22 DIAGNOSIS — R112 Nausea with vomiting, unspecified: Secondary | ICD-10-CM | POA: Insufficient documentation

## 2019-03-22 DIAGNOSIS — F17210 Nicotine dependence, cigarettes, uncomplicated: Secondary | ICD-10-CM | POA: Insufficient documentation

## 2019-03-22 DIAGNOSIS — I491 Atrial premature depolarization: Secondary | ICD-10-CM | POA: Diagnosis not present

## 2019-03-23 ENCOUNTER — Emergency Department (HOSPITAL_COMMUNITY)
Admission: EM | Admit: 2019-03-23 | Discharge: 2019-03-23 | Discharge status: Against Medical Advice | Payer: Medicare Other | Attending: Emergency Medicine | Admitting: Emergency Medicine

## 2019-03-23 ENCOUNTER — Encounter (HOSPITAL_COMMUNITY): Payer: Self-pay | Admitting: Emergency Medicine

## 2019-03-23 DIAGNOSIS — R112 Nausea with vomiting, unspecified: Secondary | ICD-10-CM | POA: Diagnosis not present

## 2019-03-23 MED ORDER — SODIUM BICARBONATE 8.4 % IV SOLN: 50.0000 meq | Freq: Once | INTRAVENOUS | Status: DC

## 2019-03-23 NOTE — ED Notes (Signed)
While setting pt up and getting her changed, pt yelled at [place] saying "shut the fuck up and ya'll are stupid bitches." Pt is clean and changed and after leaving pt room she began yelling help, even though she does not need help since Sam and I were just in the room.

## 2019-03-23 NOTE — ED Notes (Signed)
Assisted [place], RN with cleaning pt. Pt yelling to help clean her up. Pt clothing had to be cut off due to feces covering clothing. Pt repeatedly requesting narcotic medication. Pt was cleaned up and Probation officer of this note and another nurse placed pt in paper scrubs. Observed pt sit her self up in bed as well as roll side to side, moving all extremities freely.

## 2019-03-23 NOTE — ED Notes (Signed)
Pt yelling from room "somebody help," while thrasing around in bed. Pt claiming she cannot lift her head off the bed for writer to place a pillow under her head. Pt able to lift head, roll around in the bed and sling her limbs around.

## 2019-03-23 NOTE — ED Notes (Addendum)
Pt escorted out of room by security. Pt refusing all care. Pt is leaving AMA, risk were discussed with her, pt is A&O x4.
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
## 2019-03-23 NOTE — ED Notes (Signed)
Pt refusing all care accept cleaning her for feces and requesting narcotic medication. Paper scrubs given as pt under clothes had to be cut off.

## 2019-03-23 NOTE — ED Provider Notes (Signed)
[facility] DEPT Provider Note   CSN: [id number] Arrival date & time: 03/22/19  2356     History   Chief Complaint No chief complaint on file.   HPI Ruth Stephenson is a 68 y.o. female.     The history is provided by the patient.  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  Is refusing all other intervention and now states she does not want to stay for evaluation.  She is denying SI or HI.  She is AO4 at this time.    Past Medical History:  Diagnosis Date  . Cancer ([facility])   . Hepatitis C   . HIV (human immunodeficiency virus infection) [facility])     Patient Active Problem List   Diagnosis Date Noted  . Fever 09/09/2018  . Cough 09/09/2018  . Altered mental status   . Polysubstance abuse ([place])   . Overdose 05/04/2017  . Bilateral edema of lower extremity 07/29/2014  . History of colon cancer, stage II 07/29/2014  . Neuropathy 05/27/2014  . GERD (gastroesophageal reflux disease) 05/27/2014  . Cancer of right colon ([place]) 10/24/2012  . History of pancreatitis 09/19/2012  . Human immunodeficiency virus (HIV) disease ([place]) 09/19/2012  . Hepatitis C 09/19/2012  . Tobacco abuse 09/19/2012  . Anemia 09/19/2012    Past Surgical History:  Procedure Laterality Date  . CARPAL TUNNEL RELEASE Left 01/19/2014   Procedure: LEFT CARPAL TUNNEL RELEASE;  Surgeon: Schuyler Amor, MD;  Location: [facility];  Service: Orthopedics;  Laterality: Left;  . COLON  RESECTION N/A 09/23/2012   Procedure: LAPAROSCOPIC RIGHT COLON RESECTION   ;  Surgeon: Edward Jolly, MD;  Location: [facility];  Service: General;  Laterality: N/A;  LAPOROSCOPY, RIGHT HEMICOLECTOMY  . COLON SURGERY  09/23/2012   lap right colectomy  . COLONOSCOPY N/A 09/20/2012   Procedure: COLONOSCOPY;  Surgeon: Beryle Beams, MD;  Location: [facility];  Service: Endoscopy;  Laterality: N/A;  . OPEN REDUCTION INTERNAL FIXATION (ORIF) DISTAL RADIAL FRACTURE Left 01/19/2014   Procedure: OPEN REDUCTION INTERNAL FIXATION (ORIF) LEFT  DISTAL RADIAL FRACTURE;  Surgeon: Schuyler Amor, MD;  Location: [facility];  Service: Orthopedics;  Laterality: Left;     OB History   No obstetric history on file.      Home Medications    Prior to Admission medications   Medication Sig Start Date End Date Taking? Authorizing Provider  acetaminophen (TYLENOL) 500 MG tablet Take 1 tablet (500 mg total) by mouth every 6 (six) hours as needed for mild pain, moderate pain or headache. 01/13/18   Antonietta Breach, PA-C  albuterol (PROVENTIL HFA;VENTOLIN HFA) 108 (90 Base) MCG/ACT inhaler Inhale 2 puffs every 2 (two) hours as needed into the lungs for wheezing or shortness of breath.    [provider]  Darunavir-Cobicisctat-Emtricitabine-Tenofovir Alafenamide (SYMTUZA) 800-150-200-10 MG TABS Take 1 tablet by mouth daily with breakfast. 09/09/18   [place] Callas, NP  doxycycline (VIBRA-TABS) 100 MG tablet Take 1 tablet (100 mg total) by mouth  2 (two) times daily. 09/09/18   [place] Callas, NP  methadone (DOLOPHINE) 10 MG/ML solution Take 80 mg by mouth daily.     [provider]  predniSONE (DELTASONE) 20 MG tablet 2 tabs po daily x 4 days 01/23/18   Mesner, Corene Cornea, MD  sulfamethoxazole-trimethoprim (BACTRIM) 400-80 MG tablet Take 1 tablet by mouth daily. 09/09/18   [place] Callas, NP    Family History Family History  Problem Relation Age of Onset  . Cancer Mother      Social History Social History   Tobacco Use  . Smoking status: Current Some Day Smoker    Packs/day: 0.50    Years: 35.00    Pack years: 17.50    Types: Cigarettes  . Smokeless tobacco: Never Used  . Tobacco comment: trying to cut back; 4-5 cigarettes per day   Substance Use Topics  . Alcohol use: Not Currently    Comment: Occasionally; none currently as of 01/28/18  . Drug use: Not Currently    Types: Cocaine, Marijuana    Comment: Heroin; in outpatient drug proram (reported on 01/28/18 visit      Allergies   Patient has no known allergies.   Review of Systems Review of Systems  Constitutional: Negative for chills and fever.  HENT: Negative for congestion and sore throat.   Eyes: Negative for visual disturbance.  Respiratory: Negative for cough and shortness of breath.   Cardiovascular: Negative for chest pain.  Gastrointestinal: Positive for diarrhea, nausea and vomiting.  Genitourinary: Negative for difficulty urinating.  Musculoskeletal: Negative for arthralgias and myalgias.  Neurological: Negative for headaches.  Psychiatric/Behavioral: Negative for hallucinations, self-injury and suicidal ideas. The patient is not nervous/anxious.   All other systems reviewed and are negative.    Physical Exam Updated Vital Signs BP (!) 164/98 (BP Location: Right Arm)   Pulse (!) 52   Temp 99.3 F (37.4 C) (Oral)   Resp (!) 25   SpO2 94%   Physical Exam Vitals signs and nursing note reviewed.  Constitutional:      General: She is not in acute distress.    Appearance: She is normal weight.  HENT:     Head: Normocephalic and atraumatic.     Nose: Nose normal.  Eyes:     Conjunctiva/sclera: Conjunctivae normal.     Pupils: Pupils are equal, round, and reactive to light.  Neck:     Musculoskeletal: Normal range of motion and neck supple.  Cardiovascular:     Rate and Rhythm: Normal rate and regular rhythm.     Pulses: Normal pulses.     Heart sounds: Normal heart  sounds.  Pulmonary:     Effort: Pulmonary effort is normal.     Breath sounds: Normal breath sounds.  Abdominal:     General: Abdomen is flat. Bowel sounds are normal.     Tenderness: There is no abdominal tenderness. There is no guarding or rebound.  Musculoskeletal: Normal range of motion.  Skin:    General: Skin is warm and dry.     Capillary Refill: Capillary refill takes less than 2 seconds.  Neurological:     General: No focal deficit present.     Mental Status: She is alert and oriented to person, place, and time.     Deep Tendon Reflexes: Reflexes normal.  Psychiatric:        Mood and Affect: Mood normal.        Behavior: Behavior normal.      ED Treatments /  Results  Labs (all labs ordered are listed, but only abnormal results are displayed) Labs Reviewed  SARS CORONAVIRUS 2 ([facility])  RAPID URINE DRUG SCREEN, HOSP PERFORMED  ACETAMINOPHEN LEVEL  SALICYLATE LEVEL  CBC WITH DIFFERENTIAL/PLATELET  URINALYSIS, ROUTINE W REFLEX MICROSCOPIC  MAGNESIUM  I-STAT CHEM 8, ED    EKG None  Radiology No results found.  Procedures Procedures (including critical care time)  Medications Ordered in ED   Final Clinical Impressions(s) / ED Diagnoses   Final diagnoses:  Nausea and vomiting, intractability of vomiting not specified, unspecified vomiting type    Patient is alert and oriented.  She has decision making capacity to refuse care.  She is informed that the risks of signing out AMA are but are not limited to: Death, coma, sepsis, dehydration and prolonged morbidity from refusing care.  Patient verbalizes understanding of these risks and wishes to leave against medical advice.  She is welcome to return at any time.   Herchel Hopkin, MD 03/23/19 [id number]

## 2019-07-09 ENCOUNTER — Telehealth: Payer: Self-pay

## 2019-07-09 NOTE — Telephone Encounter (Signed)
COVID-19 Pre-Screening Questions:07/09/19  Do you currently have a fever (>100 F), chills or unexplained body aches?NO   . Are you currently experiencing new cough, shortness of breath, sore throat, runny nose? NO .  Have you recently travelled outside the state of [place] in the last 14 days? NO .  Have you been in contact with someone that is currently pending confirmation of Covid19 testing or has been confirmed to have the [place] virus?  NO  **If the patient answers NO to ALL questions -  advise the patient to please call the clinic before coming to the office should any symptoms develop.

## 2019-07-10 ENCOUNTER — Other Ambulatory Visit: Payer: Self-pay

## 2019-07-10 ENCOUNTER — Encounter: Payer: Self-pay | Admitting: Infectious Diseases

## 2019-07-10 ENCOUNTER — Encounter: Payer: Self-pay | Admitting: Neurology

## 2019-07-10 ENCOUNTER — Ambulatory Visit (INDEPENDENT_AMBULATORY_CARE_PROVIDER_SITE_OTHER): Payer: Medicare HMO | Admitting: Infectious Diseases

## 2019-07-10 VITALS — Ht 63.0 in | Wt 158.0 lb

## 2019-07-10 DIAGNOSIS — K439 Ventral hernia without obstruction or gangrene: Secondary | ICD-10-CM | POA: Insufficient documentation

## 2019-07-10 DIAGNOSIS — Z85038 Personal history of other malignant neoplasm of large intestine: Secondary | ICD-10-CM | POA: Diagnosis not present

## 2019-07-10 DIAGNOSIS — B20 Human immunodeficiency virus [HIV] disease: Secondary | ICD-10-CM

## 2019-07-10 DIAGNOSIS — G629 Polyneuropathy, unspecified: Secondary | ICD-10-CM

## 2019-07-10 DIAGNOSIS — Z113 Encounter for screening for infections with a predominantly sexual mode of transmission: Secondary | ICD-10-CM

## 2019-07-10 DIAGNOSIS — B182 Chronic viral hepatitis C: Secondary | ICD-10-CM | POA: Diagnosis not present

## 2019-07-10 DIAGNOSIS — Z79899 Other long term (current) drug therapy: Secondary | ICD-10-CM

## 2019-07-10 NOTE — Assessment & Plan Note (Signed)
Will need repeat imaging, will plan for next visit.

## 2019-07-10 NOTE — Progress Notes (Signed)
   Subjective:    Patient ID: Ruth Stephenson, female    DOB: 06-24-1951, 69 y.o.   MRN: [id number]  HPI 69 yo F with HIV+ since 1989, on genvoya --> symtuza. She also has a hx of Hep C treated ([facility]) 2015.She had a liver u/s in 2014 that showed that she was cirrhotic. In 08-2012 she had surgery for colon cancer. Ha not had Onc or GI f/u for "years" (01-2018).  She had CT abd 04-2017: Cirrhotic morphology of the liver. Hyperdense focus within the anterior left hepatic lobe ; when clinically feasible, evaluation with liver MRI is suggested, preferably as an outpatient when the patient is able to breath hold.  She was last seen in ID 08-2018 when she had ? Pneumonia. She was again noted to have poor adherence.  Today states she was off rx til 06-12-19. Has been having more BM since on art.  C/o leg pain today and dark. Worried her circulation is bad. Ache. Not related to exertion, feels like her equilibrium is off as well. Numb. Has been on neurontin prior.   C/o soreness from her abd hernia. Worried it will burst (her sister's recently did).    HIV 1 RNA Quant (copies/mL)  Date Value  09/09/2018 <20 NOT DETECTED  01/02/2018 782 (H)  07/11/2017 23 (H)   CD4 T Cell Abs (/uL)  Date Value  01/02/2018 240 (L)  07/11/2017 210 (L)  04/05/2017 220 (L)    Review of Systems  Constitutional: Negative for appetite change.  Gastrointestinal: Positive for abdominal pain. Negative for constipation and diarrhea.  Skin: Positive for color change.  Neurological: Positive for numbness.  Please see HPI. All other systems reviewed and negative.      Objective:   Physical Exam Constitutional:      Appearance: Normal appearance.  HENT:     Mouth/Throat:     Mouth: Mucous membranes are moist.     Pharynx: No oropharyngeal exudate.  Eyes:     Extraocular Movements: Extraocular movements intact.     Pupils: Pupils are equal, round, and reactive to light.  Cardiovascular:     Rate and Rhythm:  Normal rate and regular rhythm.     Pulses:          Dorsalis pedis pulses are 3+ on the right side and 3+ on the left side.  Pulmonary:     Effort: Pulmonary effort is normal.     Breath sounds: Normal breath sounds.  Abdominal:     General: Bowel sounds are normal. There is no distension.     Palpations: Abdomen is soft.     Hernia: A hernia (easily reducible, mild tendernees, no gaurding. ) is present.  Musculoskeletal:     Cervical back: Normal range of motion and neck supple.     Right lower leg: No edema.     Left lower leg: No edema.  Feet:     Right foot:     Skin integrity: No ulcer or blister.     Toenail Condition: Fungal disease present.    Left foot:     Skin integrity: No ulcer or blister.     Toenail Condition: Fungal disease present. Skin:    Findings: No lesion.  Neurological:     Mental Status: She is alert.  Psychiatric:        Mood and Affect: Mood normal.           Assessment & Plan:

## 2019-07-10 NOTE — Assessment & Plan Note (Signed)
Will get her into neuro

## 2019-07-10 NOTE — Assessment & Plan Note (Addendum)
Needs flu and PCV 23 today Will set her up for mammo and PAP Offered/refused condoms.  Continue her symtuza.  Will get her in with PCP Will see her in 6 months.

## 2019-07-10 NOTE — Assessment & Plan Note (Signed)
Will get her back into onc.

## 2019-07-10 NOTE — Assessment & Plan Note (Signed)
Will ask surgery to see her.

## 2019-07-11 ENCOUNTER — Telehealth: Payer: Self-pay | Admitting: Oncology

## 2019-07-11 NOTE — Telephone Encounter (Signed)
Received a new pt referral from Dr. Johnnye Sima for [name] to re-establish care w/Dr. Benay Spice. She's been cld and scheduled to see Dr. Benay Spice on 2/18 at 2pm. Ruth Stephenson is aware to arrive 15 minutes early.

## 2019-07-17 LAB — COMPREHENSIVE METABOLIC PANEL
AG Ratio: 0.7 (calc) — ABNORMAL LOW (ref 1.0–2.5)
ALT: 11 U/L (ref 6–29)
AST: 22 U/L (ref 10–35)
Albumin: 3.3 g/dL — ABNORMAL LOW (ref 3.6–5.1)
Alkaline phosphatase (APISO): 63 U/L (ref 37–153)
BUN/Creatinine Ratio: 9 (calc) (ref 6–22)
BUN: 10 mg/dL (ref 7–25)
CO2: 29 mmol/L (ref 20–32)
Calcium: 9.3 mg/dL (ref 8.6–10.4)
Chloride: 105 mmol/L (ref 98–110)
Creat: 1.12 mg/dL — ABNORMAL HIGH (ref 0.50–0.99)
Globulin: 5 g/dL (calc) — ABNORMAL HIGH (ref 1.9–3.7)
Glucose, Bld: 93 mg/dL (ref 65–99)
Potassium: 4.8 mmol/L (ref 3.5–5.3)
Sodium: 139 mmol/L (ref 135–146)
Total Bilirubin: 0.4 mg/dL (ref 0.2–1.2)
Total Protein: 8.3 g/dL — ABNORMAL HIGH (ref 6.1–8.1)

## 2019-07-17 LAB — CBC
HCT: 37.6 % (ref 35.0–45.0)
Hemoglobin: 12.6 g/dL (ref 11.7–15.5)
MCH: 31.8 pg (ref 27.0–33.0)
MCHC: 33.5 g/dL (ref 32.0–36.0)
MCV: 94.9 fL (ref 80.0–100.0)
MPV: 11 fL (ref 7.5–12.5)
Platelets: 83 10*3/uL — ABNORMAL LOW (ref 140–400)
RBC: 3.96 10*6/uL (ref 3.80–5.10)
RDW: 12.8 % (ref 11.0–15.0)
WBC: 1.9 10*3/uL — ABNORMAL LOW (ref 3.8–10.8)

## 2019-07-17 LAB — HIV-1 RNA QUANT-NO REFLEX-BLD
HIV 1 RNA Quant: 281 copies/mL — ABNORMAL HIGH
HIV-1 RNA Quant, Log: 2.45 Log copies/mL — ABNORMAL HIGH

## 2019-07-17 LAB — RPR: RPR Ser Ql: NONREACTIVE

## 2019-07-23 IMAGING — DX DG CHEST 1V PORT
1 series · 1 of 1 positions shown · non-contrast
Comparison: Chest radiograph May 04, 2017 and chest CT May 05, 2017

CLINICAL DATA: Hypoxia

EXAM:
PORTABLE CHEST 1 VIEW

[chest ap]
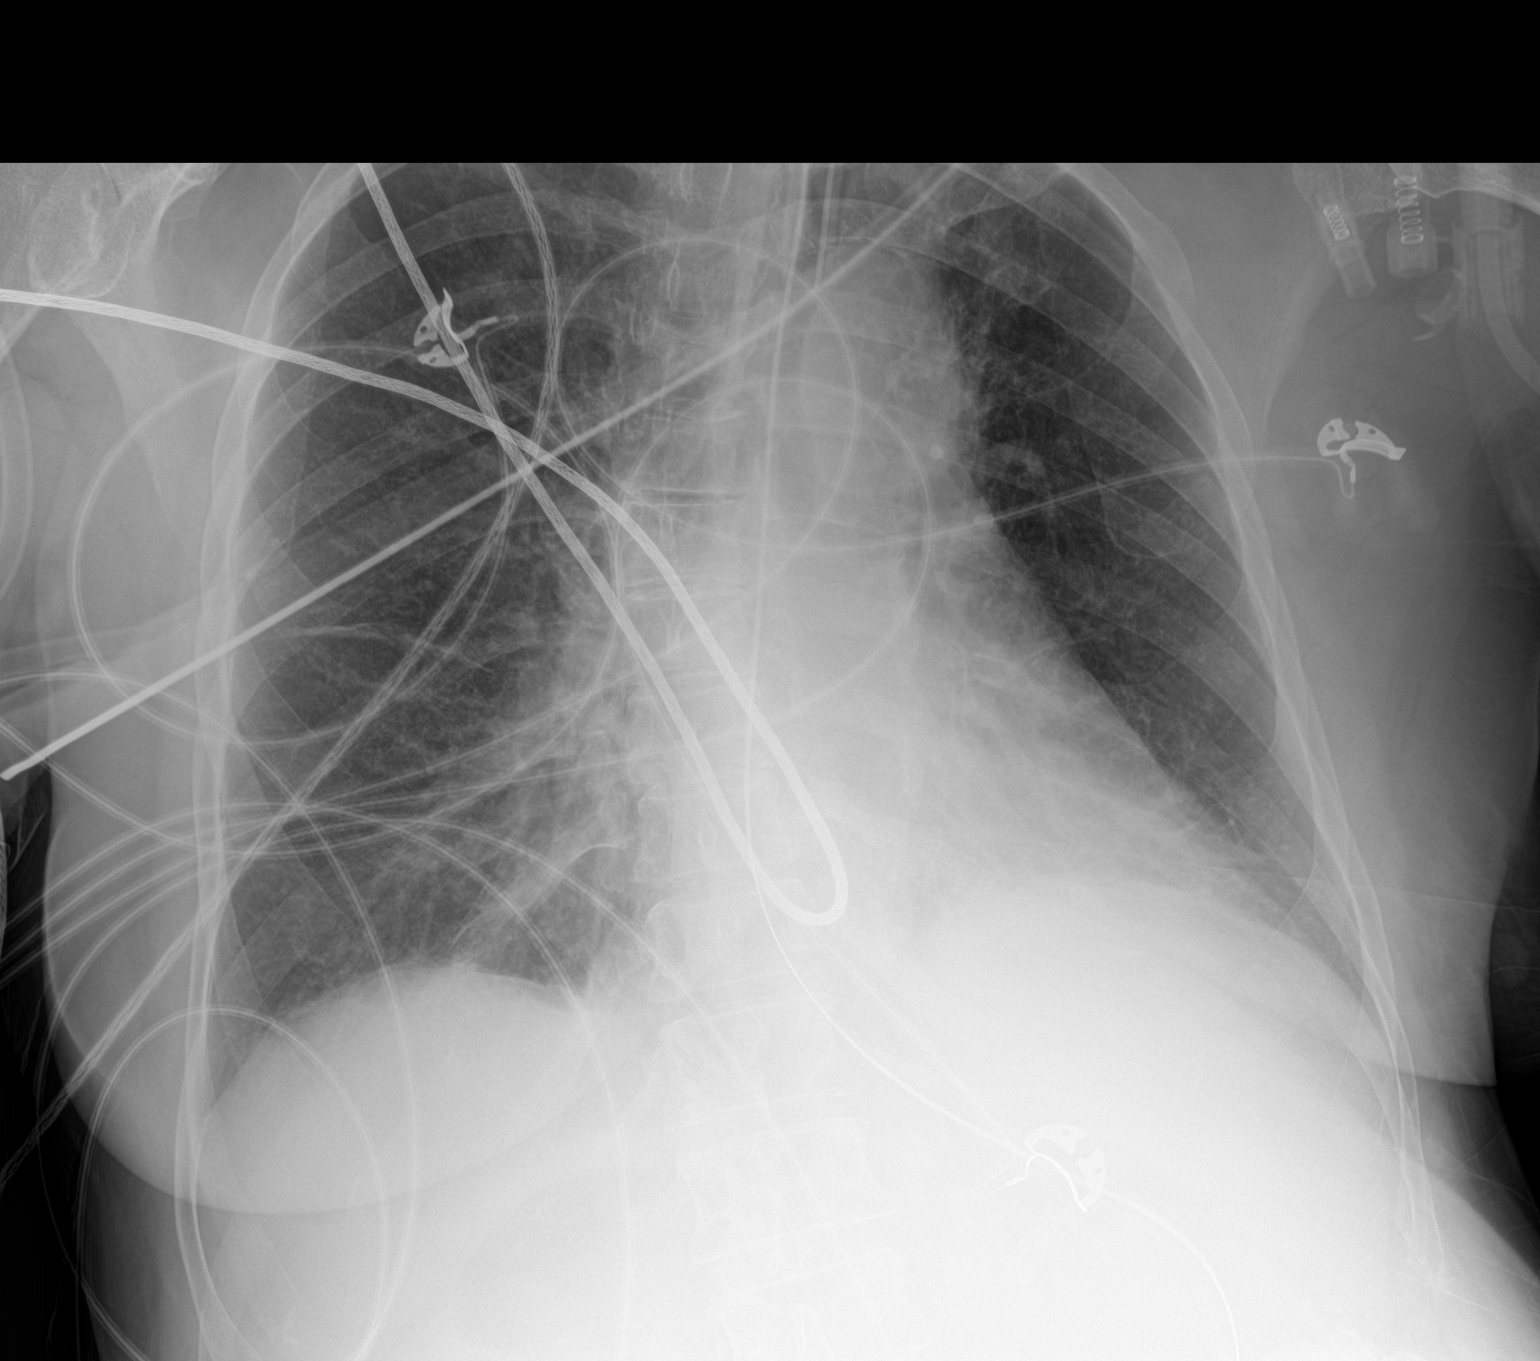

[1 of 1 positions shown; findings below may reference images not displayed]

FINDINGS: Endotracheal tube tip is 3.8 cm above the carina. Nasogastric tube
tip and side port below the diaphragm. No pneumothorax. There has
been interval resolution of interstitial edema. There is atelectatic
change in the left base. No well-defined airspace consolidation.
Heart is upper normal in size with pulmonary vascularity within
normal limits. No adenopathy. There is aortic atherosclerosis. No
evident bone lesions.
IMPRESSION: Tube positions as described without pneumothorax. Interval
resolution of edema. Patchy atelectasis left base present. No
consolidation. Stable cardiac silhouette. There is aortic
atherosclerosis.

Aortic Atherosclerosis ([id number]).

## 2019-08-04 ENCOUNTER — Telehealth: Payer: Self-pay | Admitting: *Deleted

## 2019-08-04 NOTE — Telephone Encounter (Signed)
Ok, thank you! Would you be ok rechecking her viral load too?

## 2019-08-04 NOTE — Telephone Encounter (Signed)
Patient returning call. She is scheduled for PAP at [facility] on 2/16. She asked for her lab results, states she was off [place] for several months. She restarted the day of her appointment. She was concerned that she is detectable, would be interested in having the lab redrawn at her PAP.  She asked for her CD4 as well - this was not drawn at the visit. Landis Gandy, RN

## 2019-08-04 NOTE — Telephone Encounter (Signed)
Looks like she had a CMP in January 2021. Just CD4 is OK.

## 2019-08-05 NOTE — Telephone Encounter (Signed)
If it will be helpful for her sure

## 2019-08-08 ENCOUNTER — Ambulatory Visit: Payer: Medicare HMO | Admitting: Neurology

## 2019-08-12 ENCOUNTER — Ambulatory Visit: Payer: Medicare HMO | Admitting: Infectious Diseases

## 2019-08-13 ENCOUNTER — Ambulatory Visit: Payer: Medicare HMO

## 2019-08-14 ENCOUNTER — Inpatient Hospital Stay: Payer: Medicare HMO | Admitting: Oncology

## 2019-08-14 ENCOUNTER — Telehealth: Payer: Self-pay | Admitting: *Deleted

## 2019-08-14 ENCOUNTER — Telehealth: Payer: Self-pay | Admitting: Oncology

## 2019-08-14 NOTE — Telephone Encounter (Signed)
Ruth Stephenson has been rescheduled to see Dr. Benay Spice on 3/8 at 2pm.

## 2019-08-14 NOTE — Telephone Encounter (Signed)
Called to confirm she is en route to her new patient appointment today at 2 pm. She reports she is not coming due to road conditions. Message sent to new patient scheduler to reschedule for ~ 1 month per Dr. Benay Spice.

## 2019-08-20 ENCOUNTER — Encounter: Payer: Self-pay | Admitting: General Practice

## 2019-08-20 ENCOUNTER — Ambulatory Visit: Payer: Medicare HMO

## 2019-08-26 DIAGNOSIS — F1120 Opioid dependence, uncomplicated: Secondary | ICD-10-CM | POA: Diagnosis not present

## 2019-09-01 ENCOUNTER — Inpatient Hospital Stay: Payer: Medicaid Other | Attending: Oncology | Admitting: Oncology

## 2019-09-01 ENCOUNTER — Encounter: Payer: Self-pay | Admitting: *Deleted

## 2019-09-01 NOTE — Progress Notes (Signed)
Patient is "no show" for 2nd time and has not called office.

## 2019-09-02 ENCOUNTER — Telehealth: Payer: Self-pay | Admitting: Oncology

## 2019-09-02 NOTE — Telephone Encounter (Signed)
Pt no showed for her appt w/Dr. Benay Spice on 3/8. I notified the referring office and provided our office number for the pt to reschedule.

## 2019-09-10 DIAGNOSIS — F1120 Opioid dependence, uncomplicated: Secondary | ICD-10-CM | POA: Diagnosis not present

## 2019-09-16 ENCOUNTER — Other Ambulatory Visit: Payer: Self-pay | Admitting: *Deleted

## 2019-09-16 DIAGNOSIS — B20 Human immunodeficiency virus [HIV] disease: Secondary | ICD-10-CM

## 2019-09-16 MED ORDER — SYMTUZA 800-150-200-10 MG PO TABS: 1.0000 | ORAL_TABLET | Freq: Every day | ORAL | 3 refills | Status: DC

## 2019-09-23 MED FILL — SYMTUZA 800-150-200-10 MG T: 800-150-200 | 30 days supply | Qty: 30 | Fill #0

## 2019-10-10 DIAGNOSIS — F1120 Opioid dependence, uncomplicated: Secondary | ICD-10-CM | POA: Diagnosis not present

## 2019-10-16 DIAGNOSIS — F1120 Opioid dependence, uncomplicated: Secondary | ICD-10-CM | POA: Diagnosis not present

## 2019-10-23 MED FILL — SYMTUZA 800-150-200-10 MG T: 800-150-200 | 30 days supply | Qty: 30 | Fill #1

## 2019-11-06 DIAGNOSIS — F1120 Opioid dependence, uncomplicated: Secondary | ICD-10-CM | POA: Diagnosis not present

## 2019-11-20 DIAGNOSIS — F1120 Opioid dependence, uncomplicated: Secondary | ICD-10-CM | POA: Diagnosis not present

## 2019-11-25 MED FILL — SYMTUZA 800-150-200-10 MG T: 800-150-200 | 30 days supply | Qty: 30 | Fill #2

## 2019-12-19 DIAGNOSIS — F1120 Opioid dependence, uncomplicated: Secondary | ICD-10-CM | POA: Diagnosis not present

## 2019-12-22 ENCOUNTER — Other Ambulatory Visit: Payer: Self-pay | Admitting: Infectious Diseases

## 2019-12-22 MED FILL — SULFAMETHOXAZOLE-TMP SS TAB: 400-80 | 30 days supply | Qty: 30 | Fill #0

## 2019-12-22 NOTE — Telephone Encounter (Signed)
Needs office visit.   Laverle Patter, RN

## 2019-12-25 MED FILL — SYMTUZA 800-150-200-10 MG T: 800-150-200 | 30 days supply | Qty: 30 | Fill #3

## 2019-12-30 ENCOUNTER — Other Ambulatory Visit (HOSPITAL_COMMUNITY)
Admission: RE | Admit: 2019-12-30 | Discharge: 2019-12-30 | Disposition: A | Discharge status: Home / Self Care | Payer: Medicare HMO | Source: Ambulatory Visit | Attending: Infectious Diseases | Admitting: Infectious Diseases

## 2019-12-30 ENCOUNTER — Other Ambulatory Visit: Payer: Self-pay

## 2019-12-30 ENCOUNTER — Other Ambulatory Visit: Payer: Medicare HMO

## 2019-12-30 DIAGNOSIS — B20 Human immunodeficiency virus [HIV] disease: Secondary | ICD-10-CM

## 2019-12-30 DIAGNOSIS — Z113 Encounter for screening for infections with a predominantly sexual mode of transmission: Secondary | ICD-10-CM | POA: Diagnosis not present

## 2019-12-30 DIAGNOSIS — Z79899 Other long term (current) drug therapy: Secondary | ICD-10-CM | POA: Diagnosis not present

## 2019-12-31 LAB — URINE CYTOLOGY ANCILLARY ONLY
Chlamydia: NEGATIVE
Comment: NEGATIVE
Comment: NORMAL
Neisseria Gonorrhea: NEGATIVE

## 2019-12-31 LAB — T-HELPER CELL (CD4) - (RCID CLINIC ONLY)
CD4 % Helper T Cell: 19 % — ABNORMAL LOW (ref 33–65)
CD4 T Cell Abs: 221 /uL — ABNORMAL LOW (ref 400–1790)

## 2020-01-05 ENCOUNTER — Other Ambulatory Visit: Payer: Self-pay

## 2020-01-05 ENCOUNTER — Ambulatory Visit (INDEPENDENT_AMBULATORY_CARE_PROVIDER_SITE_OTHER): Payer: Medicare HMO

## 2020-01-05 ENCOUNTER — Ambulatory Visit (INDEPENDENT_AMBULATORY_CARE_PROVIDER_SITE_OTHER): Payer: Medicare HMO | Admitting: Podiatrist

## 2020-01-05 ENCOUNTER — Encounter: Payer: Self-pay | Admitting: Podiatrist

## 2020-01-05 VITALS — Temp 96.6°F

## 2020-01-05 DIAGNOSIS — M2042 Other hammer toe(s) (acquired), left foot: Secondary | ICD-10-CM | POA: Diagnosis not present

## 2020-01-05 DIAGNOSIS — M2041 Other hammer toe(s) (acquired), right foot: Secondary | ICD-10-CM

## 2020-01-05 DIAGNOSIS — M79671 Pain in right foot: Secondary | ICD-10-CM

## 2020-01-05 DIAGNOSIS — G629 Polyneuropathy, unspecified: Secondary | ICD-10-CM

## 2020-01-05 DIAGNOSIS — M79672 Pain in left foot: Secondary | ICD-10-CM

## 2020-01-05 DIAGNOSIS — M216X9 Other acquired deformities of unspecified foot: Secondary | ICD-10-CM

## 2020-01-05 MED ORDER — GABAPENTIN 300 MG PO CAPS: 300.0000 mg | ORAL_CAPSULE | Freq: Three times a day (TID) | ORAL | 3 refills | Status: DC

## 2020-01-05 NOTE — Patient Instructions (Addendum)
Ask your primary care doctor if "Lamisil" might be an option for you to take for your toenails-  Because it goes through the liver, he would need to OK the medicine (lamisil 250mg  daily for 90 days)   Neuropathic Pain Neuropathic pain is pain caused by damage to the nerves that are responsible for certain sensations in your body (sensory nerves). The pain can be caused by:  Damage to the sensory nerves that send signals to your spinal cord and brain (peripheral nervous system).  Damage to the sensory nerves in your brain or spinal cord (central nervous system). Neuropathic pain can make you more sensitive to pain. Even a minor sensation can feel very painful. This is usually a long-term condition that can be difficult to treat. The type of pain differs from person to person. It may:  Start suddenly (acute), or it may develop slowly and last for a long time (chronic).  Come and go as damaged nerves heal, or it may stay at the same level for years.  Cause emotional distress, loss of sleep, and a lower quality of life. What are the causes? The most common cause of this condition is diabetes. Many other diseases and conditions can also cause neuropathic pain. Causes of neuropathic pain can be classified as:  Toxic. This is caused by medicines and chemicals. The most common cause of toxic neuropathic pain is damage from cancer treatments (chemotherapy).  Metabolic. This can be caused by: ? Diabetes. This is the most common disease that damages the nerves. ? Lack of vitamin B from long-term alcohol abuse.  Traumatic. Any injury that cuts, crushes, or stretches a nerve can cause damage and pain. A common example is feeling pain after losing an arm or leg (phantom limb pain).  Compression-related. If a sensory nerve gets trapped or compressed for a long period of time, the blood supply to the nerve can be cut off.  Vascular. Many blood vessel diseases can cause neuropathic pain by decreasing  blood supply and oxygen to nerves.  Autoimmune. This type of pain results from diseases in which the body's defense system (immune system) mistakenly attacks sensory nerves. Examples of autoimmune diseases that can cause neuropathic pain include lupus and multiple sclerosis.  Infectious. Many types of viral infections can damage sensory nerves and cause pain. Shingles infection is a common cause of this type of pain.  Inherited. Neuropathic pain can be a symptom of many diseases that are passed down through families (genetic). What increases the risk? You are more likely to develop this condition if:  You have diabetes.  You smoke.  You drink too much alcohol.  You are taking certain medicines, including medicines that kill cancer cells (chemotherapy) or that treat immune system disorders. What are the signs or symptoms? The main symptom is pain. Neuropathic pain is often described as:  Burning.  Shock-like.  Stinging.  Hot or cold.  Itching. How is this diagnosed? No single test can diagnose neuropathic pain. It is diagnosed based on:  Physical exam and your symptoms. Your health care provider will ask you about your pain. You may be asked to use a pain scale to describe how bad your pain is.  Tests. These may be done to see if you have a high sensitivity to pain and to help find the cause and location of any sensory nerve damage. They include: ? Nerve conduction studies to test how well nerve signals travel through your sensory nerves (electrodiagnostic testing). ? Stimulating your sensory nerves  through electrodes on your skin and measuring the response in your spinal cord and brain (somatosensory evoked potential).  Imaging studies, such as: ? X-rays. ? CT scan. ? MRI. How is this treated? Treatment for neuropathic pain may change over time. You may need to try different treatment options or a combination of treatments. Some options include:  Treating the underlying  cause of the neuropathy, such as diabetes, kidney disease, or vitamin deficiencies.  Stopping medicines that can cause neuropathy, such as chemotherapy.  Medicine to relieve pain. Medicines may include: ? Prescription or over-the-counter pain medicine. ? Anti-seizure medicine. ? Antidepressant medicines. ? Pain-relieving patches that are applied to painful areas of skin. ? A medicine to numb the area (local anesthetic), which can be injected as a nerve block.  Transcutaneous nerve stimulation. This uses electrical currents to block painful nerve signals. The treatment is painless.  Alternative treatments, such as: ? Acupuncture. ? Meditation. ? Massage. ? Physical therapy. ? Pain management programs. ? Counseling. Follow these instructions at home: Medicines   Take over-the-counter and prescription medicines only as told by your health care provider.  Do not drive or use heavy machinery while taking prescription pain medicine.  If you are taking prescription pain medicine, take actions to prevent or treat constipation. Your health care provider may recommend that you: ? Drink enough fluid to keep your urine pale yellow. ? Eat foods that are high in fiber, such as fresh fruits and vegetables, whole grains, and beans. ? Limit foods that are high in fat and processed sugars, such as fried or sweet foods. ? Take an over-the-counter or prescription medicine for constipation. Lifestyle   Have a good support system at home.  Consider joining a chronic pain support group.  Do not use any products that contain nicotine or tobacco, such as cigarettes and e-cigarettes. If you need help quitting, ask your health care provider.  Do not drink alcohol. General instructions  Learn as much as you can about your condition.  Work closely with all your health care providers to find the treatment plan that works best for you.  Ask your health care provider what activities are safe for  you.  Keep all follow-up visits as told by your health care provider. This is important. Contact a health care provider if:  Your pain treatments are not working.  You are having side effects from your medicines.  You are struggling with tiredness (fatigue), mood changes, depression, or anxiety. Summary  Neuropathic pain is pain caused by damage to the nerves that are responsible for certain sensations in your body (sensory nerves).  Neuropathic pain may come and go as damaged nerves heal, or it may stay at the same level for years.  Neuropathic pain is usually a long-term condition that can be difficult to treat. Consider joining a chronic pain support group. This information is not intended to replace advice given to you by your health care provider. Make sure you discuss any questions you have with your health care provider. Document Revised: 10/03/2018 Document Reviewed: 06/29/2017 Elsevier Patient Education  [place].

## 2020-01-06 LAB — HIV-1 RNA QUANT-NO REFLEX-BLD
HIV 1 RNA Quant: <20 copies/mL
HIV-1 RNA Quant, Log: <1.3 Log copies/mL

## 2020-01-06 LAB — COMPREHENSIVE METABOLIC PANEL
AG Ratio: 0.9 (calc) — ABNORMAL LOW (ref 1.0–2.5)
ALT: 10 U/L (ref 6–29)
AST: 23 U/L (ref 10–35)
Albumin: 3.8 g/dL (ref 3.6–5.1)
Alkaline phosphatase (APISO): 65 U/L (ref 37–153)
BUN: 10 mg/dL (ref 7–25)
CO2: 25 mmol/L (ref 20–32)
Calcium: 9.7 mg/dL (ref 8.6–10.4)
Chloride: 105 mmol/L (ref 98–110)
Creat: 0.88 mg/dL (ref 0.50–0.99)
Globulin: 4.2 g/dL (calc) — ABNORMAL HIGH (ref 1.9–3.7)
Glucose, Bld: 137 mg/dL — ABNORMAL HIGH (ref 65–99)
Potassium: 4.7 mmol/L (ref 3.5–5.3)
Sodium: 140 mmol/L (ref 135–146)
Total Bilirubin: 0.6 mg/dL (ref 0.2–1.2)
Total Protein: 8 g/dL (ref 6.1–8.1)

## 2020-01-06 LAB — LIPID PANEL
Cholesterol: 163 mg/dL (ref <200)
HDL: 59 mg/dL (ref >=50)
LDL Cholesterol (Calc): 88 mg/dL (calc)
Non-HDL Cholesterol (Calc): 104 mg/dL (calc) (ref <130)
Total CHOL/HDL Ratio: 2.8 (calc) (ref <5.0)
Triglycerides: 69 mg/dL (ref <150)

## 2020-01-06 LAB — RPR: RPR Ser Ql: NONREACTIVE

## 2020-01-06 LAB — CBC

## 2020-01-06 NOTE — Progress Notes (Signed)
  Chief Complaint  Patient presents with  . Foot Problem    Bilateral, entire foot and ankle. Pt stated, "It feels like I'm still wearing shoes when I take them off".  . Foot Pain    Bilateral plantar midfoot and forefoot. Pt stated, "It's been off and on for years. 10/10 burning pain when I stand for awhile. It feels like a rubber band is pulling against me when I flex my feet".  . Skin Problem    Dark discoloration. Bilateral ankles. No swelling per pt. No pain.  . Callouses    R plantar forefoot submet 1, L plantar forefoot submet 5. R callus is painful.     HPI: Patient is 69 y.o. female who presents today for the concerns as listed above. She has several concerns at today's visit with the foot pain and sensation issues being the most concerning.  She has also noticed her skin becoming darker on her shins and ankles bilaterally.  She has a callus on both feet that are painful and she trims them herself.    Review of Systems No fevers, chills, nausea, muscle aches, no difficulty breathing, no calf pain, no chest pain or shortness of breath.   Physical Exam  GENERAL APPEARANCE: Alert, conversant. Appropriately groomed. No acute distress.   VASCULAR: Pedal pulses palpable DP and PT bilateral.  Capillary refill time is immediate to all digits,  Proximal to distal cooling it warm to warm.  Digital perfusion adequate.   NEUROLOGIC: sensation is decrased epicritically and protectively to 5.07 monofilament at 0/5 sites bilateral.  Light touch is decreased bilateral, vibratory sensation diminished bilateral, achilles tendon reflex is intact bilateral.   MUSCULOSKELETAL: acceptable muscle strength, tone and stability bilateral.  Hammertoe contracture bilaterally noted. mildly Plantarflexed first and fifth metatarsals bilaterally noted.   No pain, crepitus or limitation noted with foot and ankle range of motion bilateral.   DERMATOLOGIC: skin is warm, supple, and dry.  No open lesions noted.  Darkening of the skin of the anterior shins and ankles is noted.  Most likely due to mild chronic swelling.  Digital nails are asymptomatic.  Calluses present submet 1 left and submet 5 right foot.  Intact dermis noted. No sign of infection seen  Radiographic exam:  Normal osseous mineralization.  No fracture or dislocation or acute osseous abnormalities present. Joint spaces are normal.  Hammertoe deformity present bilaterally.     Assessment     ICD-10-CM   1. Bilateral foot pain  M79.671 DG Foot Complete Right   M79.672 DG Foot Complete Left  2. Neuropathy  G62.9   3. Hammer toes of both feet  M20.41    M20.42   4. Prominent metatarsal head, unspecified laterality  M21.6X9      Plan  For the neuropathy I recommended gabapentin-  rx for 300 qhs and slowly increasing to tid as needed was written.    Light paring of the calluses with a 15 blade accomplished today-    Diabetic shoes could be beneficial for her in the future to accommodate the plantarflexed metatarsals and hammertoes.   Explained the darkening is likely venous stasis skin changes due to swelling.    She will return in 4 weeks for follow up on the gabapentin

## 2020-01-12 ENCOUNTER — Other Ambulatory Visit: Payer: Self-pay | Admitting: Podiatrist

## 2020-01-12 DIAGNOSIS — M2041 Other hammer toe(s) (acquired), right foot: Secondary | ICD-10-CM

## 2020-01-12 DIAGNOSIS — M2042 Other hammer toe(s) (acquired), left foot: Secondary | ICD-10-CM

## 2020-01-13 ENCOUNTER — Telehealth (INDEPENDENT_AMBULATORY_CARE_PROVIDER_SITE_OTHER): Payer: Medicare HMO | Admitting: Infectious Diseases

## 2020-01-13 ENCOUNTER — Other Ambulatory Visit: Payer: Self-pay

## 2020-01-13 DIAGNOSIS — C182 Malignant neoplasm of ascending colon: Secondary | ICD-10-CM | POA: Diagnosis not present

## 2020-01-13 DIAGNOSIS — B182 Chronic viral hepatitis C: Secondary | ICD-10-CM | POA: Diagnosis not present

## 2020-01-13 DIAGNOSIS — Z113 Encounter for screening for infections with a predominantly sexual mode of transmission: Secondary | ICD-10-CM | POA: Diagnosis not present

## 2020-01-13 DIAGNOSIS — Z79899 Other long term (current) drug therapy: Secondary | ICD-10-CM | POA: Diagnosis not present

## 2020-01-13 DIAGNOSIS — K760 Fatty (change of) liver, not elsewhere classified: Secondary | ICD-10-CM

## 2020-01-13 DIAGNOSIS — B20 Human immunodeficiency virus [HIV] disease: Secondary | ICD-10-CM | POA: Diagnosis not present

## 2020-01-13 DIAGNOSIS — Z85038 Personal history of other malignant neoplasm of large intestine: Secondary | ICD-10-CM | POA: Diagnosis not present

## 2020-01-13 NOTE — Assessment & Plan Note (Signed)
She appears to be doing well.  Given her CD4 and HIV RNA appears to be adherent now.  Will see her in 6 months for in person visit.  Labs ordered.

## 2020-01-13 NOTE — Assessment & Plan Note (Signed)
She had prev been sent to GI for f/u colon.  Will also send her MRI of liver to f/u previous lesion.

## 2020-01-13 NOTE — Progress Notes (Signed)
   Subjective:    Patient ID: Ruth Stephenson, female    DOB: [birth year], 69 y.o.   MRN: [id number]  HPI 69 yo F with HIV+ since 1989, on genvoya --> Symtuza. She also has a hx of Hep C treated ([facility]) 2015.She had a liver u/s in 2014 that showed that she was cirrhotic. In 08-2012 she had surgery for colon cancer (II).  She had CT abd 04-2017: Cirrhotic morphology of the liver. Hyperdense focus within the anterior left hepatic lobe ; when clinically feasible, evaluation with liver MRI is suggested, preferably as an outpatient when the patient is able to breath hold. Last Onc f/u was 01-2018 and she was to have f/u colonoscopy as well as MRI of liver to f/u prev lesion.   Has been seen by podiatry for foot fungus she has had for years.   HIV 1 RNA Quant (copies/mL)  Date Value  12/30/2019 <20 NOT DETECTED  07/10/2019 281 (H)  09/09/2018 <20 NOT DETECTED   CD4 T Cell Abs (/uL)  Date Value  12/30/2019 221 (L)  01/02/2018 240 (L)  07/11/2017 210 (L)    Review of Systems  Constitutional: Negative for appetite change and unexpected weight change.  Gastrointestinal: Negative for constipation and diarrhea.  Genitourinary: Negative for difficulty urinating.  c/o wt gain: 185#. Please see HPI. All other systems reviewed and negative.  1. Due to the national emergency this service was provided using telemedicine. Phone  2. Patient consented for the telehealth visit and that you identified patient  3. Your locations, Pt (home) and Provider ([facility])  4. [facility] f/u.   5. No one else on call  6. If the visit was a phone call, that you include the time you spent on call- 10 minutes       Objective:   Physical Exam Phone visit.        Assessment & Plan:

## 2020-01-15 ENCOUNTER — Ambulatory Visit: Payer: Medicare HMO | Admitting: Infectious Diseases

## 2020-01-20 ENCOUNTER — Other Ambulatory Visit: Payer: Self-pay | Admitting: Infectious Diseases

## 2020-01-20 DIAGNOSIS — B20 Human immunodeficiency virus [HIV] disease: Secondary | ICD-10-CM

## 2020-01-22 ENCOUNTER — Telehealth: Payer: Self-pay

## 2020-01-22 MED FILL — SULFAMETHOXAZOLE-TMP SS TAB: 400-80 | 30 days supply | Qty: 30 | Fill #0

## 2020-01-22 MED FILL — SYMTUZA 800-150-200-10 MG T: 800-150-200 | 30 days supply | Qty: 30 | Fill #0

## 2020-01-22 NOTE — Telephone Encounter (Signed)
Patient returning [name] call. Call transferred but coordinator is on another line at this time.  Ruth Stephenson

## 2020-01-26 DIAGNOSIS — F1120 Opioid dependence, uncomplicated: Secondary | ICD-10-CM | POA: Diagnosis not present

## 2020-02-02 DIAGNOSIS — F1120 Opioid dependence, uncomplicated: Secondary | ICD-10-CM | POA: Diagnosis not present

## 2020-02-09 DIAGNOSIS — F1120 Opioid dependence, uncomplicated: Secondary | ICD-10-CM | POA: Diagnosis not present

## 2020-02-16 ENCOUNTER — Ambulatory Visit: Payer: Medicare HMO | Admitting: Podiatry

## 2020-02-16 DIAGNOSIS — F1120 Opioid dependence, uncomplicated: Secondary | ICD-10-CM | POA: Diagnosis not present

## 2020-02-17 ENCOUNTER — Other Ambulatory Visit: Payer: Medicare HMO

## 2020-02-20 MED FILL — SYMTUZA 800-150-200-10 MG T: 800-150-200 | 30 days supply | Qty: 30 | Fill #1

## 2020-02-20 MED FILL — SULFAMETHOXAZOLE-TMP SS TAB: 400-80 | 30 days supply | Qty: 30 | Fill #1

## 2020-02-23 DIAGNOSIS — F1120 Opioid dependence, uncomplicated: Secondary | ICD-10-CM | POA: Diagnosis not present

## 2020-03-08 DIAGNOSIS — F1120 Opioid dependence, uncomplicated: Secondary | ICD-10-CM | POA: Diagnosis not present

## 2020-03-09 ENCOUNTER — Other Ambulatory Visit: Payer: Medicare HMO

## 2020-03-14 ENCOUNTER — Encounter (HOSPITAL_COMMUNITY): Payer: Self-pay | Admitting: Nephrology

## 2020-03-14 ENCOUNTER — Inpatient Hospital Stay (HOSPITAL_COMMUNITY)
Admission: EM | Admit: 2020-03-14 | Discharge: 2020-03-18 | DRG: 177 | Disposition: A | Discharge status: Home / Self Care | Payer: Medicare HMO | Attending: Internal Medicine | Admitting: Internal Medicine

## 2020-03-14 ENCOUNTER — Emergency Department (HOSPITAL_COMMUNITY): Payer: Medicare HMO

## 2020-03-14 ENCOUNTER — Other Ambulatory Visit: Payer: Self-pay

## 2020-03-14 DIAGNOSIS — R1310 Dysphagia, unspecified: Secondary | ICD-10-CM | POA: Diagnosis present

## 2020-03-14 DIAGNOSIS — J1282 Pneumonia due to coronavirus disease 2019: Secondary | ICD-10-CM | POA: Diagnosis not present

## 2020-03-14 DIAGNOSIS — U071 COVID-19: Principal | ICD-10-CM | POA: Diagnosis present

## 2020-03-14 DIAGNOSIS — Z21 Asymptomatic human immunodeficiency virus [HIV] infection status: Secondary | ICD-10-CM | POA: Diagnosis present

## 2020-03-14 DIAGNOSIS — B20 Human immunodeficiency virus [HIV] disease: Secondary | ICD-10-CM | POA: Diagnosis present

## 2020-03-14 DIAGNOSIS — B182 Chronic viral hepatitis C: Secondary | ICD-10-CM | POA: Diagnosis not present

## 2020-03-14 DIAGNOSIS — R0602 Shortness of breath: Secondary | ICD-10-CM | POA: Diagnosis present

## 2020-03-14 DIAGNOSIS — Z9049 Acquired absence of other specified parts of digestive tract: Secondary | ICD-10-CM | POA: Diagnosis not present

## 2020-03-14 DIAGNOSIS — R05 Cough: Secondary | ICD-10-CM | POA: Diagnosis not present

## 2020-03-14 DIAGNOSIS — Z809 Family history of malignant neoplasm, unspecified: Secondary | ICD-10-CM

## 2020-03-14 DIAGNOSIS — F1120 Opioid dependence, uncomplicated: Secondary | ICD-10-CM | POA: Diagnosis not present

## 2020-03-14 DIAGNOSIS — K219 Gastro-esophageal reflux disease without esophagitis: Secondary | ICD-10-CM | POA: Diagnosis present

## 2020-03-14 DIAGNOSIS — D6959 Other secondary thrombocytopenia: Secondary | ICD-10-CM | POA: Diagnosis not present

## 2020-03-14 DIAGNOSIS — R059 Cough, unspecified: Secondary | ICD-10-CM | POA: Diagnosis present

## 2020-03-14 DIAGNOSIS — Z79899 Other long term (current) drug therapy: Secondary | ICD-10-CM | POA: Diagnosis not present

## 2020-03-14 DIAGNOSIS — T380X5A Adverse effect of glucocorticoids and synthetic analogues, initial encounter: Secondary | ICD-10-CM | POA: Diagnosis not present

## 2020-03-14 DIAGNOSIS — Z85038 Personal history of other malignant neoplasm of large intestine: Secondary | ICD-10-CM | POA: Diagnosis not present

## 2020-03-14 DIAGNOSIS — B1920 Unspecified viral hepatitis C without hepatic coma: Secondary | ICD-10-CM | POA: Diagnosis present

## 2020-03-14 DIAGNOSIS — J9601 Acute respiratory failure with hypoxia: Secondary | ICD-10-CM | POA: Diagnosis not present

## 2020-03-14 DIAGNOSIS — D72819 Decreased white blood cell count, unspecified: Secondary | ICD-10-CM | POA: Diagnosis not present

## 2020-03-14 DIAGNOSIS — B370 Candidal stomatitis: Secondary | ICD-10-CM | POA: Diagnosis not present

## 2020-03-14 DIAGNOSIS — R739 Hyperglycemia, unspecified: Secondary | ICD-10-CM | POA: Diagnosis present

## 2020-03-14 DIAGNOSIS — F17210 Nicotine dependence, cigarettes, uncomplicated: Secondary | ICD-10-CM | POA: Diagnosis present

## 2020-03-14 DIAGNOSIS — G894 Chronic pain syndrome: Secondary | ICD-10-CM | POA: Diagnosis present

## 2020-03-14 DIAGNOSIS — F1129 Opioid dependence with unspecified opioid-induced disorder: Secondary | ICD-10-CM | POA: Diagnosis not present

## 2020-03-14 DIAGNOSIS — E875 Hyperkalemia: Secondary | ICD-10-CM | POA: Diagnosis present

## 2020-03-14 LAB — CBC WITH DIFFERENTIAL/PLATELET
Abs Immature Granulocytes: 0.05 10*3/uL (ref 0.00–0.07)
Basophils Absolute: 0 10*3/uL (ref 0.0–0.1)
Basophils Relative: 1 %
Eosinophils Absolute: 0 10*3/uL (ref 0.0–0.5)
Eosinophils Relative: 1 %
HCT: 50.7 % — ABNORMAL HIGH (ref 36.0–46.0)
Hemoglobin: 16.4 g/dL — ABNORMAL HIGH (ref 12.0–15.0)
Immature Granulocytes: 1 %
Lymphocytes Relative: 37 %
Lymphs Abs: 1.4 10*3/uL (ref 0.7–4.0)
MCH: 32.9 pg (ref 26.0–34.0)
MCHC: 32.3 g/dL (ref 30.0–36.0)
MCV: 101.6 fL — ABNORMAL HIGH (ref 80.0–100.0)
Monocytes Absolute: 0.5 10*3/uL (ref 0.1–1.0)
Monocytes Relative: 12 %
Neutro Abs: 1.9 10*3/uL (ref 1.7–7.7)
Neutrophils Relative %: 48 %
Platelets: 150 10*3/uL (ref 150–400)
RBC: 4.99 MIL/uL (ref 3.87–5.11)
RDW: 12.5 % (ref 11.5–15.5)
WBC: 3.8 10*3/uL — ABNORMAL LOW (ref 4.0–10.5)
nRBC: 0 % (ref 0.0–0.2)

## 2020-03-14 LAB — COMPREHENSIVE METABOLIC PANEL
ALT: 18 U/L (ref 0–44)
AST: 29 U/L (ref 15–41)
Albumin: 3.6 g/dL (ref 3.5–5.0)
Alkaline Phosphatase: 63 U/L (ref 38–126)
Anion gap: 12 (ref 5–15)
BUN: 24 mg/dL — ABNORMAL HIGH (ref 8–23)
CO2: 29 mmol/L (ref 22–32)
Calcium: 9.8 mg/dL (ref 8.9–10.3)
Chloride: 98 mmol/L (ref 98–111)
Creatinine, Ser: 0.95 mg/dL (ref 0.44–1.00)
GFR calc Af Amer: >60 mL/min (ref >60)
GFR calc non Af Amer: >60 mL/min (ref >60)
Glucose, Bld: 96 mg/dL (ref 70–99)
Potassium: 5.2 mmol/L — ABNORMAL HIGH (ref 3.5–5.1)
Sodium: 139 mmol/L (ref 135–145)
Total Bilirubin: 0.8 mg/dL (ref 0.3–1.2)
Total Protein: 9.4 g/dL — ABNORMAL HIGH (ref 6.5–8.1)

## 2020-03-14 LAB — SARS CORONAVIRUS 2 BY RT PCR (HOSPITAL ORDER, PERFORMED IN ~~LOC~~ HOSPITAL LAB): SARS Coronavirus 2: POSITIVE — AB

## 2020-03-14 LAB — LACTATE DEHYDROGENASE: LDH: 155 U/L (ref 98–192)

## 2020-03-14 LAB — PROCALCITONIN: Procalcitonin: <0.1 ng/mL

## 2020-03-14 LAB — LACTIC ACID, PLASMA: Lactic Acid, Venous: 1.4 mmol/L (ref 0.5–1.9)

## 2020-03-14 LAB — D-DIMER, QUANTITATIVE: D-Dimer, Quant: 1.27 ug/mL-FEU — ABNORMAL HIGH (ref 0.00–0.50)

## 2020-03-14 LAB — C-REACTIVE PROTEIN: CRP: 1.8 mg/dL — ABNORMAL HIGH (ref <1.0)

## 2020-03-14 LAB — FIBRINOGEN: Fibrinogen: 686 mg/dL — ABNORMAL HIGH (ref 210–475)

## 2020-03-14 LAB — FERRITIN: Ferritin: 255 ng/mL (ref 11–307)

## 2020-03-14 LAB — TRIGLYCERIDES: Triglycerides: 77 mg/dL (ref <150)

## 2020-03-14 MED ORDER — SODIUM CHLORIDE 0.45 % IV SOLN: INTRAVENOUS | Status: DC

## 2020-03-14 MED ORDER — FLUCONAZOLE 150 MG PO TABS
150.0000 mg | ORAL_TABLET | Freq: Once | ORAL | Status: AC
  Administered 2020-03-14: 150 mg via ORAL
  Filled 2020-03-14: qty 1

## 2020-03-14 MED ORDER — DEXAMETHASONE SODIUM PHOSPHATE 10 MG/ML IJ SOLN
10.0000 mg | Freq: Once | INTRAMUSCULAR | Status: AC
  Administered 2020-03-14: 10 mg via INTRAVENOUS
  Filled 2020-03-14: qty 1

## 2020-03-14 MED ORDER — ALBUTEROL SULFATE HFA 108 (90 BASE) MCG/ACT IN AERS
2.0000 | INHALATION_SPRAY | Freq: Once | RESPIRATORY_TRACT | Status: AC
  Administered 2020-03-14: 2 via RESPIRATORY_TRACT
  Filled 2020-03-14: qty 6.7

## 2020-03-14 MED ORDER — SODIUM CHLORIDE 0.9 % IV SOLN
200.0000 mg | Freq: Once | INTRAVENOUS | Status: AC
  Administered 2020-03-14: 200 mg via INTRAVENOUS
  Filled 2020-03-14: qty 200

## 2020-03-14 MED ORDER — SODIUM CHLORIDE 0.9 % IV SOLN
100.0000 mg | Freq: Every day | INTRAVENOUS | Status: AC
  Administered 2020-03-15 – 2020-03-18 (×4): 100 mg via INTRAVENOUS
  Filled 2020-03-14 (×4): qty 20

## 2020-03-14 MED ORDER — ENOXAPARIN SODIUM 40 MG/0.4ML ~~LOC~~ SOLN
40.0000 mg | SUBCUTANEOUS | Status: DC
  Administered 2020-03-15 – 2020-03-17 (×3): 40 mg via SUBCUTANEOUS
  Filled 2020-03-14 (×3): qty 0.4

## 2020-03-14 NOTE — H&P (Signed)
[facility] History & Physical  Rob Doctor, hospital MD  Ruth Stephenson 03/14/2020  Chief Complaint: Cough and SOB HPI: The patient is a 69 y.o. year-old w/ hx HIV, treated hep C who has been sick for 10days, was exposed to COVID at a barbecue the day prior to getting sick. Has quarantined herself af home, still having fevers and other symptoms but altogether feeling better however has prod cough and SOB which aren't improving so came to ED.  In ED CXR shows bibasilar infiltrates, SpO2 in ED was 87%, 94% on 2 L [place]. Labs showed WBC 3,  Hb 16, CRP 1.8.  COVID -19 positive (+). Pt given IV decadron 10mg , we are called to admit pt for COVID pna w/ hypoxemia.   Pt states she was at a barbecue and the next day (10d prior to today) became very ill w/ severe fevers, chills, and bodyaches.  This persisted for many days, accompanied by prod cough w/ tan secretions, SOB, nausea , mild emesis, mild diarrhea, +loss of taste and smell.  She is still have sweats and fevers, but main reason for coming to ED was continued cough and some SOB.  Pt has not been vaccinated for COVID-19, but says today that "I wish I would have done it".    Pt was treated for hep C in the past successfully.  She has HIV f/b by ID here on therapy w/ undetectable virus on her last testing.  No other health issues.     ROS  denies CP  no joint pain   no HA  no blurry vision  no rash  no dysuria  no difficulty voiding  no change in urine color   Past Medical History  Past Medical History:  Diagnosis Date  . Cancer ([place])   . Hepatitis C   . HIV (human immunodeficiency virus infection) ([place])    Past Surgical History  Past Surgical History:  Procedure Laterality Date  . CARPAL TUNNEL RELEASE Left 01/19/2014   Procedure: LEFT CARPAL TUNNEL RELEASE;  Surgeon: Schuyler Amor, MD;  Location: [facility];  Service: Orthopedics;  Laterality: Left;  . COLON RESECTION N/A 09/23/2012   Procedure: LAPAROSCOPIC  RIGHT COLON RESECTION   ;  Surgeon: Edward Jolly, MD;  Location: [facility];  Service: General;  Laterality: N/A;  LAPOROSCOPY, RIGHT HEMICOLECTOMY  . COLON SURGERY  09/23/2012   lap right colectomy  . COLONOSCOPY N/A 09/20/2012   Procedure: COLONOSCOPY;  Surgeon: Beryle Beams, MD;  [facility];  Service: Endoscopy;  Laterality: N/A;  . OPEN REDUCTION INTERNAL FIXATION (ORIF) DISTAL RADIAL FRACTURE Left 01/19/2014   Procedure: OPEN REDUCTION INTERNAL FIXATION (ORIF) LEFT  DISTAL RADIAL FRACTURE;  Surgeon: Schuyler Amor, MD;  Location: [facility];  Service: Orthopedics;  Laterality: Left;   Family History  Family History  Problem Relation Age of Onset  . Cancer Mother    Social History  reports that she has been smoking cigarettes. She has a 17.50 pack-year smoking history. She has never used smokeless tobacco. She reports previous alcohol use. She reports previous drug use. Drugs: Cocaine and Marijuana. Allergies No Known Allergies Home medications Prior to Admission medications   Medication Sig Start Date End Date Taking? Authorizing Provider  acetaminophen (TYLENOL) 500 MG tablet Take 1 tablet (500 mg total) by mouth every 6 (six) hours as needed for mild pain, moderate pain or headache. Patient not taking: Reported on 01/13/2020 01/13/18   Antonietta Breach, PA-C  albuterol (PROVENTIL HFA;VENTOLIN HFA) 108 (90 Base) MCG/ACT inhaler Inhale 2 puffs every 2 (two) hours as needed into the lungs for wheezing or shortness of breath. Patient not taking: Reported on 01/13/2020    [provider]  gabapentin (NEURONTIN) 300 MG capsule Take 1 capsule (300 mg total) by mouth 3 (three) times daily. Start out taking 1 pill before bedtime-  After a week add another pill at dinner time (pm) and at bedtime.  After another week you may add a 3rd pill in the morning for a total of 3 pills a day. 01/05/20   Bronson Ing, DPM  methadone (DOLOPHINE) 10 MG/ML solution Take  80 mg by mouth daily.     [provider]  sulfamethoxazole-trimethoprim (BACTRIM) 400-80 MG tablet TAKE 1 TABLET BY MOUTH ONCE A DAY Patient taking differently: Take 1 tablet by mouth daily.  01/20/20   Campbell Riches, MD  SYMTUZA 800-150-200-10 MG TABS TAKE 1 TABLET BY MOUTH DAILY WITH BREAKFAST. 01/20/20   Campbell Riches, MD       Exam Gen alert, nasal O2, pleasant and calm in no distress No rash, cyanosis or gangrene Sclera anicteric, throat clear  No jvd or bruits Chest occ scattered wheezes/ rhonchi, mostly clear RRR no MRG Abd soft ntnd no mass or ascites +bs GU defer MS no joint effusions or deformity Ext no leg or UE edema, no wounds or ulcers Neuro is alert, Ox 3 , nf    Home meds:  - gabapentin 300 tid  - methadone 80 qd  - bactrim 400-80 mg 1 qd/ symtuza 800-150-200 qd  - prn's/ vitamins/ supplements   Assessment: 1. COVID pna - sick x 10d at home, + COVID test here. Bibasilar CXR infiltrates, cough/ SOB and hypoxemia. Admit, IV remdesivir and IV decadron, usual daily labs, O2 support, gentle IVF's.   Consider baracitanib if not improving or worsening (pt declined today).  2. Acute resp failure w/ hypoxemia - due to #1 3. Opioid dependence - pharm consult for methadone 4. HIV - on ART w/ undetectable virus on last testing 12/30/19 5. H/o hepatitis C - successfully treated in the past , per the pt 6. H/o colon Ca - R colon, stage 2, sp R colectomy 2014, was f/b Dr Benay Spice, last visit 2019, in remission.  7. Possible cirrhosis - seen by GI in 2015    Kelly Splinter  MD 03/14/2020, 10:16 PM

## 2020-03-14 NOTE — ED Triage Notes (Signed)
Pt POV-  Pt reports cough, ShOB since 03/03/2020.  Pt has been quarantining since. Pt O2 sats 83% on RA.   Pt also c/o "bad headache" and stomach pains.   Pt placed on 3L O2 via  in triage. O2 sats increased to 92%.

## 2020-03-14 NOTE — ED Provider Notes (Signed)
[facility] DEPT Provider Note   CSN: [id number] Arrival date & time: 03/14/20  1617     History No chief complaint on file.   Ruth Stephenson is a 69 y.o. female hx of HIV (last CD 4 220 2 months ago), here presenting with shortness of breath.  Patient states that she went to a cookout on the eighth and since then she has been having some shortness of breath and myalgias.  She was concerned she had Covid so has been in quarantine all this time.  Her family had similar symptoms as well.  Patient states that she has been home and for the last 3 to 4 days, she has worsening shortness of breath.  Her oxygen was noted to be 87% on room air and she does not wear oxygen at home.  She also noticed that she has some oral thrush as well.  The history is provided by the patient.       Past Medical History:  Diagnosis Date  . Cancer ([place])   . Hepatitis C   . HIV (human immunodeficiency virus infection) [facility])     Patient Active Problem List   Diagnosis Date Noted  . Hernia of abdominal wall 07/10/2019  . Fever 09/09/2018  . Cough 09/09/2018  . Altered mental status   . Polysubstance abuse ([place])   . Overdose 05/04/2017  . Bilateral edema of lower extremity 07/29/2014  . History of colon cancer, stage II 07/29/2014  . Neuropathy 05/27/2014  . GERD (gastroesophageal reflux disease) 05/27/2014  . Cancer of right colon ([place]) 10/24/2012  . History of pancreatitis 09/19/2012  . Human immunodeficiency virus (HIV) disease ([place]) 09/19/2012  . Hepatitis C 09/19/2012  . Tobacco abuse 09/19/2012  . Anemia 09/19/2012    Past Surgical History:  Procedure Laterality Date  . CARPAL TUNNEL RELEASE Left 01/19/2014   Procedure: LEFT CARPAL TUNNEL RELEASE;  Surgeon: Schuyler Amor, MD;  Location: [facility];  Service: Orthopedics;  Laterality: Left;  . COLON RESECTION N/A 09/23/2012   Procedure: LAPAROSCOPIC RIGHT COLON RESECTION   ;  Surgeon: Edward Jolly, MD;  [facility];  Service: General;  Laterality: N/A;  LAPOROSCOPY, RIGHT HEMICOLECTOMY  . COLON SURGERY  09/23/2012   lap right colectomy  . COLONOSCOPY N/A 09/20/2012   Procedure: COLONOSCOPY;  Surgeon: Beryle Beams, MD;  [facility];  Service: Endoscopy;  Laterality: N/A;  . OPEN REDUCTION INTERNAL FIXATION (ORIF) DISTAL RADIAL FRACTURE Left 01/19/2014   Procedure: OPEN REDUCTION INTERNAL FIXATION (ORIF) LEFT  DISTAL RADIAL FRACTURE;  Surgeon: Schuyler Amor, MD;  Location: [facility];  Service: Orthopedics;  Laterality: Left;     OB History   No obstetric history on file.     Family History  Problem Relation Age of Onset  . Cancer Mother     Social History   Tobacco Use  . Smoking status: Current Some Day Smoker    Packs/day: 0.50    Years: 35.00    Pack years: 17.50    Types: Cigarettes  . Smokeless tobacco: Never Used  . Tobacco comment: trying to cut back; 4-5 cigarettes per day   Vaping Use  . Vaping Use: Never used  Substance Use Topics  . Alcohol use: Not Currently    Comment: Occasionally; none currently as of 01/28/18  . Drug use: Not Currently    Types: Cocaine, Marijuana    Comment: Heroin; in outpatient drug proram (reported on 01/28/18  visit     Home Medications Prior to Admission medications   Medication Sig Start Date End Date Taking? Authorizing Provider  acetaminophen (TYLENOL) 500 MG tablet Take 1 tablet (500 mg total) by mouth every 6 (six) hours as needed for mild pain, moderate pain or headache. Patient not taking: Reported on 01/13/2020 01/13/18   Antonietta Breach, PA-C  albuterol (PROVENTIL HFA;VENTOLIN HFA) 108 (90 Base) MCG/ACT inhaler Inhale 2 puffs every 2 (two) hours as needed into the lungs for wheezing or shortness of breath. Patient not taking: Reported on 01/13/2020    [provider]  gabapentin (NEURONTIN) 300 MG capsule Take 1 capsule (300 mg total) by mouth 3 (three) times daily. Start  out taking 1 pill before bedtime-  After a week add another pill at dinner time (pm) and at bedtime.  After another week you may add a 3rd pill in the morning for a total of 3 pills a day. 01/05/20   Bronson Ing, DPM  methadone (DOLOPHINE) 10 MG/ML solution Take 80 mg by mouth daily.     [provider]  sulfamethoxazole-trimethoprim (BACTRIM) 400-80 MG tablet TAKE 1 TABLET BY MOUTH ONCE A DAY 01/20/20   Campbell Riches, MD  SYMTUZA 800-150-200-10 MG TABS TAKE 1 TABLET BY MOUTH DAILY WITH BREAKFAST. 01/20/20   Campbell Riches, MD    Allergies    Patient has no known allergies.  Review of Systems   Review of Systems  Respiratory: Positive for cough and shortness of breath.   All other systems reviewed and are negative.   Physical Exam Updated Vital Signs BP 117/72   Pulse 60   Temp 99.1 F (37.3 C) (Oral)   Resp 15   Ht 5\' 3"  (1.6 m)   Wt 81 kg   SpO2 92%   BMI 31.62 kg/m   Physical Exam Vitals and nursing note reviewed.  Constitutional:      Comments: tachypneic   HENT:     Head: Normocephalic.     Nose: Nose normal.     Mouth/Throat:     Comments: Oral thrush  Eyes:     Extraocular Movements: Extraocular movements intact.     Pupils: Pupils are equal, round, and reactive to light.  Cardiovascular:     Rate and Rhythm: Normal rate and regular rhythm.     Pulses: Normal pulses.     Heart sounds: Normal heart sounds.  Pulmonary:     Comments: Crackles bilateral bases  Musculoskeletal:        General: Normal range of motion.     Cervical back: Normal range of motion.  Skin:    General: Skin is warm.     Capillary Refill: Capillary refill takes less than 2 seconds.  Neurological:     General: No focal deficit present.     Mental Status: She is alert and oriented to person, place, and time.  Psychiatric:        Mood and Affect: Mood normal.        Behavior: Behavior normal.     ED Results / Procedures / Treatments   Labs (all labs ordered  are listed, but only abnormal results are displayed) Labs Reviewed  SARS CORONAVIRUS 2 BY RT PCR (HOSPITAL ORDER, [facility]) - Abnormal; Notable for the following components:      Result Value   SARS Coronavirus 2 POSITIVE (*)    All other components within normal limits  CBC WITH DIFFERENTIAL/PLATELET - Abnormal; Notable  for the following components:   WBC 3.8 (*)    Hemoglobin 16.4 (*)    HCT 50.7 (*)    MCV 101.6 (*)    All other components within normal limits  COMPREHENSIVE METABOLIC PANEL - Abnormal; Notable for the following components:   Potassium 5.2 (*)    BUN 24 (*)    Total Protein 9.4 (*)    All other components within normal limits  D-DIMER, QUANTITATIVE (NOT AT [facility]) - Abnormal; Notable for the following components:   D-Dimer, Quant 1.27 (*)    All other components within normal limits  FIBRINOGEN - Abnormal; Notable for the following components:   Fibrinogen 686 (*)    All other components within normal limits  C-REACTIVE PROTEIN - Abnormal; Notable for the following components:   CRP 1.8 (*)    All other components within normal limits  CULTURE, BLOOD (ROUTINE X 2)  CULTURE, BLOOD (ROUTINE X 2)  LACTIC ACID, PLASMA  PROCALCITONIN  LACTATE DEHYDROGENASE  FERRITIN  TRIGLYCERIDES    EKG EKG Interpretation  Date/Time:  Sunday March 14 2020 17:58:42 EDT Ventricular Rate:  74 PR Interval:    QRS Duration: 102 QT Interval:  363 QTC Calculation: 403 R Axis:   81 Text Interpretation: Sinus rhythm Borderline right axis deviation Probable left ventricular hypertrophy No significant change since last tracing Confirmed by Wandra Arthurs [phone]) on 03/14/2020 6:05:04 PM   Radiology DG Chest Port 1 View  Result Date: 03/14/2020 CLINICAL DATA:  Cough.  COVID-19. EXAM: PORTABLE CHEST 1 VIEW COMPARISON:  September 09, 2018 FINDINGS: There is infiltrate in the periphery of the right base. There is infiltrate in the periphery of the left base.  The heart, hila, mediastinum, lungs, and pleura are otherwise unremarkable. No pneumothorax. IMPRESSION: Peripheral bibasilar infiltrates, left greater than right, consistent with the patient's history of COVID-19. Electronically Signed   By: Dorise Bullion III M.D   On: 03/14/2020 18:37    Procedures Procedures (including critical care time)  CRITICAL CARE Performed by: Wandra Arthurs   Total critical care time: 30 minutes  Critical care time was exclusive of separately billable procedures and treating other patients.  Critical care was necessary to treat or prevent imminent or life-threatening deterioration.  Critical care was time spent personally by me on the following activities: development of treatment plan with patient and/or surrogate as well as nursing, discussions with consultants, evaluation of patient's response to treatment, examination of patient, obtaining history from patient or surrogate, ordering and performing treatments and interventions, ordering and review of laboratory studies, ordering and review of radiographic studies, pulse oximetry and re-evaluation of patient's condition.   Medications Ordered in ED Medications  albuterol (VENTOLIN HFA) 108 (90 Base) MCG/ACT inhaler 2 puff (2 puffs Inhalation Given 03/14/20 1922)  dexamethasone (DECADRON) injection 10 mg (10 mg Intravenous Given 03/14/20 1926)  fluconazole (DIFLUCAN) tablet 150 mg (150 mg Oral Given 03/14/20 1922)    ED Course  I have reviewed the triage vital signs and the nursing notes.  Pertinent labs & imaging results that were available during my care of the patient were reviewed by me and considered in my medical decision making (see chart for details).    MDM Rules/Calculators/A&P                         ENYLA LISBON is a 69 y.o. female here presenting with possible Covid.  Patient had a possible exposure to about 10 days  ago and now has worsening shortness of breath. She never got tested for  Covid.  She is hypoxic to 87% and did not receive the Covid vaccine.  Plan to get Covid preadmission labs and test for Covid negative chest x-ray.  9:43 PM COVID is positive. Inflammatory markers elevated. CXR showed bibasilar infiltrates. Given steroids, will admit for hypoxia from [place] was evaluated in Emergency Department on 03/14/2020 for the symptoms described in the history of present illness. She was evaluated in the context of the global COVID-19 pandemic, which necessitated consideration that the patient might be at risk for infection with the SARS-CoV-2 virus that causes COVID-19. Institutional protocols and algorithms that pertain to the evaluation of patients at risk for COVID-19 are in a state of rapid change based on information released by regulatory bodies including the CDC and federal and state organizations. These policies and algorithms were followed during the patient's care in the ED.   Final Clinical Impression(s) / ED Diagnoses Final diagnoses:  None    Rx / DC Orders ED Discharge Orders    None       Drenda Freeze, MD 03/14/20 2144

## 2020-03-15 ENCOUNTER — Encounter (HOSPITAL_COMMUNITY): Payer: Self-pay | Admitting: Nephrology

## 2020-03-15 LAB — COMPREHENSIVE METABOLIC PANEL
ALT: 18 U/L (ref 0–44)
AST: 29 U/L (ref 15–41)
Albumin: 3.5 g/dL (ref 3.5–5.0)
Alkaline Phosphatase: 60 U/L (ref 38–126)
Anion gap: 9 (ref 5–15)
BUN: 18 mg/dL (ref 8–23)
CO2: 26 mmol/L (ref 22–32)
Calcium: 9.3 mg/dL (ref 8.9–10.3)
Chloride: 98 mmol/L (ref 98–111)
Creatinine, Ser: 0.74 mg/dL (ref 0.44–1.00)
GFR calc Af Amer: >60 mL/min (ref >60)
GFR calc non Af Amer: >60 mL/min (ref >60)
Glucose, Bld: 131 mg/dL — ABNORMAL HIGH (ref 70–99)
Potassium: 4.8 mmol/L (ref 3.5–5.1)
Sodium: 133 mmol/L — ABNORMAL LOW (ref 135–145)
Total Bilirubin: 1 mg/dL (ref 0.3–1.2)
Total Protein: 9.1 g/dL — ABNORMAL HIGH (ref 6.5–8.1)

## 2020-03-15 LAB — CBC WITH DIFFERENTIAL/PLATELET
Abs Immature Granulocytes: 0.03 10*3/uL (ref 0.00–0.07)
Basophils Absolute: 0 10*3/uL (ref 0.0–0.1)
Basophils Relative: 0 %
Eosinophils Absolute: 0 10*3/uL (ref 0.0–0.5)
Eosinophils Relative: 2 %
HCT: 49.5 % — ABNORMAL HIGH (ref 36.0–46.0)
Hemoglobin: 15.8 g/dL — ABNORMAL HIGH (ref 12.0–15.0)
Immature Granulocytes: 1 %
Lymphocytes Relative: 29 %
Lymphs Abs: 0.8 10*3/uL (ref 0.7–4.0)
MCH: 32.8 pg (ref 26.0–34.0)
MCHC: 31.9 g/dL (ref 30.0–36.0)
MCV: 102.7 fL — ABNORMAL HIGH (ref 80.0–100.0)
Monocytes Absolute: 0.1 10*3/uL (ref 0.1–1.0)
Monocytes Relative: 3 %
Neutro Abs: 1.7 10*3/uL (ref 1.7–7.7)
Neutrophils Relative %: 65 %
Platelets: 120 10*3/uL — ABNORMAL LOW (ref 150–400)
RBC: 4.82 MIL/uL (ref 3.87–5.11)
RDW: 12.3 % (ref 11.5–15.5)
WBC: 2.6 10*3/uL — ABNORMAL LOW (ref 4.0–10.5)
nRBC: 0 % (ref 0.0–0.2)

## 2020-03-15 LAB — MAGNESIUM: Magnesium: 2 mg/dL (ref 1.7–2.4)

## 2020-03-15 LAB — PHOSPHORUS: Phosphorus: 2.7 mg/dL (ref 2.5–4.6)

## 2020-03-15 LAB — D-DIMER, QUANTITATIVE: D-Dimer, Quant: 2.62 ug/mL-FEU — ABNORMAL HIGH (ref 0.00–0.50)

## 2020-03-15 LAB — C-REACTIVE PROTEIN: CRP: 1.4 mg/dL — ABNORMAL HIGH (ref <1.0)

## 2020-03-15 LAB — FERRITIN: Ferritin: 281 ng/mL (ref 11–307)

## 2020-03-15 MED ORDER — METHADONE HCL 10 MG/ML PO CONC
90.0000 mg | Freq: Every day | ORAL | Status: DC
  Administered 2020-03-15 – 2020-03-18 (×4): 90 mg via ORAL
  Filled 2020-03-15 (×4): qty 9

## 2020-03-15 MED ORDER — DEXAMETHASONE SODIUM PHOSPHATE 10 MG/ML IJ SOLN
6.0000 mg | INTRAMUSCULAR | Status: DC
  Administered 2020-03-15: 6 mg via INTRAVENOUS
  Filled 2020-03-15: qty 1

## 2020-03-15 MED ORDER — ALBUTEROL SULFATE HFA 108 (90 BASE) MCG/ACT IN AERS
2.0000 | INHALATION_SPRAY | Freq: Four times a day (QID) | RESPIRATORY_TRACT | Status: DC
  Administered 2020-03-15 – 2020-03-16 (×5): 2 via RESPIRATORY_TRACT
  Filled 2020-03-15 (×2): qty 6.7

## 2020-03-15 MED ORDER — ACETAMINOPHEN 325 MG PO TABS: 650.0000 mg | ORAL_TABLET | Freq: Four times a day (QID) | ORAL | Status: DC | PRN

## 2020-03-15 MED ORDER — GUAIFENESIN-DM 100-10 MG/5ML PO SYRP: 10.0000 mL | ORAL_SOLUTION | ORAL | Status: DC | PRN

## 2020-03-15 MED ORDER — HYDROCOD POLST-CPM POLST ER 10-8 MG/5ML PO SUER: 5.0000 mL | Freq: Two times a day (BID) | ORAL | Status: DC | PRN

## 2020-03-15 MED ORDER — ZINC SULFATE 220 (50 ZN) MG PO CAPS
220.0000 mg | ORAL_CAPSULE | Freq: Every day | ORAL | Status: DC
  Administered 2020-03-15 – 2020-03-18 (×4): 220 mg via ORAL
  Filled 2020-03-15 (×4): qty 1

## 2020-03-15 MED ORDER — POLYETHYLENE GLYCOL 3350 17 G PO PACK
17.0000 g | PACK | Freq: Every day | ORAL | Status: DC | PRN
  Administered 2020-03-15: 17 g via ORAL
  Filled 2020-03-15: qty 1

## 2020-03-15 MED ORDER — ASCORBIC ACID 500 MG PO TABS
500.0000 mg | ORAL_TABLET | Freq: Every day | ORAL | Status: DC
  Administered 2020-03-15 – 2020-03-18 (×4): 500 mg via ORAL
  Filled 2020-03-15 (×4): qty 1

## 2020-03-15 MED ORDER — ONDANSETRON HCL 4 MG PO TABS: 4.0000 mg | ORAL_TABLET | Freq: Four times a day (QID) | ORAL | Status: DC | PRN

## 2020-03-15 MED ORDER — SULFAMETHOXAZOLE-TRIMETHOPRIM 400-80 MG PO TABS
1.0000 | ORAL_TABLET | Freq: Every day | ORAL | Status: DC
  Administered 2020-03-15 – 2020-03-18 (×4): 1 via ORAL
  Filled 2020-03-15 (×4): qty 1

## 2020-03-15 MED ORDER — ONDANSETRON HCL 4 MG/2ML IJ SOLN
4.0000 mg | Freq: Four times a day (QID) | INTRAMUSCULAR | Status: DC | PRN
  Administered 2020-03-15: 4 mg via INTRAVENOUS
  Filled 2020-03-15: qty 2

## 2020-03-15 MED ORDER — BISACODYL 5 MG PO TBEC: 5.0000 mg | DELAYED_RELEASE_TABLET | Freq: Every day | ORAL | Status: DC | PRN

## 2020-03-15 MED ORDER — FLUCONAZOLE 150 MG PO TABS
150.0000 mg | ORAL_TABLET | Freq: Every day | ORAL | Status: AC
  Administered 2020-03-15 – 2020-03-17 (×3): 150 mg via ORAL
  Filled 2020-03-15 (×3): qty 1

## 2020-03-15 MED ORDER — DARUN-COBIC-EMTRICIT-TENOFAF 800-150-200-10 MG PO TABS
1.0000 | ORAL_TABLET | Freq: Every day | ORAL | Status: DC
  Administered 2020-03-15 – 2020-03-18 (×4): 1 via ORAL
  Filled 2020-03-15 (×4): qty 1

## 2020-03-15 NOTE — Plan of Care (Signed)
  Problem: Education: Goal: Knowledge of risk factors and measures for prevention of condition will improve Outcome: Progressing   Problem: Coping: Goal: Psychosocial and spiritual needs will be supported Outcome: Progressing   Problem: Respiratory: Goal: Will maintain a patent airway Outcome: Progressing Goal: Complications related to the disease process, condition or treatment will be avoided or minimized Outcome: Progressing   

## 2020-03-15 NOTE — Evaluation (Signed)
Physical Therapy One Time Evaluation Patient Details Name: Ruth Stephenson MRN: [id number] DOB: August 08, 1950 Today's Date: 03/15/2020   History of Present Illness  69 y.o. year-old with PMHx significant for HIV, treated hep C who has been sick for 10days, was exposed to [place] at a barbecue the day prior to getting sick and admitted for COVID-19 viral pneumonia  Clinical Impression  Patient evaluated by Physical Therapy with no further acute PT needs identified. All education has been completed and the patient has no further questions.  See below for any follow-up Physical Therapy or equipment needs. PT is signing off. Thank you for this referral.  Pt up in bathroom on arrival.  Pt ambulated around room with SPO2 89-92% on room air.  Pt encouraged to change positions often and take rest breaks as needed.  Pt reports she was up earlier and wiping down all the surfaces in her room, denies dyspnea with this activity.  Pt encouraged to perform marching, mini-squats, hip abduction and heel raises in standing x10 as tolerated (also demonstrated).  Pt also educated on proning.  Pt very pleasant and motivated to improve health. Pt had no further questions.     Follow Up Recommendations No PT follow up    Equipment Recommendations  None recommended by PT    Recommendations for Other Services       Precautions / Restrictions Precautions Precautions: None      Mobility  Bed Mobility                  Transfers Overall transfer level: Independent               General transfer comment: pt in bathroom alone on arrival  Ambulation/Gait Ambulation/Gait assistance: Modified independent (Device/Increase time) Gait Distance (Feet): 35 Feet Assistive device: None Gait Pattern/deviations: WFL(Within Functional Limits)     General Gait Details: Spo2 89-92% on room air with ambulation  Stairs            Wheelchair Mobility    Modified Rankin (Stroke Patients Only)        Balance Overall balance assessment: No apparent balance deficits (not formally assessed)                                           Pertinent Vitals/Pain Pain Assessment: No/denies pain    Home Living Family/patient expects to be discharged to:: Private residence Living Arrangements: Non-relatives/Friends           Home Layout: One level Home Equipment: None      Prior Function Level of Independence: Independent               Hand Dominance        Extremity/Trunk Assessment        Lower Extremity Assessment Lower Extremity Assessment: Overall WFL for tasks assessed    Cervical / Trunk Assessment Cervical / Trunk Assessment: Normal  Communication   Communication: No difficulties  Cognition Arousal/Alertness: Awake/alert Behavior During Therapy: WFL for tasks assessed/performed Overall Cognitive Status: Within Functional Limits for tasks assessed                                        General Comments      Exercises     Assessment/Plan    PT  Assessment Patent does not need any further PT services  PT Problem List         PT Treatment Interventions      PT Goals (Current goals can be found in the Care Plan section)  Acute Rehab PT Goals PT Goal Formulation: All assessment and education complete, DC therapy    Frequency     Barriers to discharge        Co-evaluation               AM-PAC PT "6 Clicks" Mobility  Outcome Measure Help needed turning from your back to your side while in a flat bed without using bedrails?: None Help needed moving from lying on your back to sitting on the side of a flat bed without using bedrails?: None Help needed moving to and from a bed to a chair (including a wheelchair)?: None Help needed standing up from a chair using your arms (e.g., wheelchair or bedside chair)?: None Help needed to walk in hospital room?: None Help needed climbing 3-5 steps with a railing? :  None 6 Click Score: 24    End of Session   Activity Tolerance: Patient tolerated treatment well Patient left: in chair;with call bell/phone within reach Nurse Communication: Mobility status PT Visit Diagnosis: Difficulty in walking, not elsewhere classified (R26.2)    Time: 7076-1518 PT Time Calculation (min) (ACUTE ONLY): 21 min   Charges:   PT Evaluation $PT Eval Low Complexity: 1 Low     Kati PT, DPT Acute Rehabilitation Services Pager: [phone] Office: [phone]  York Ram E 03/15/2020, 3:56 PM

## 2020-03-15 NOTE — Progress Notes (Signed)
PROGRESS NOTE  SINDI BECKWORTH WUJ:[id number] DOB: [birth year] DOA: 03/14/2020 PCP: Patient, No Pcp Per  HPI/Recap of past 24 hours: Chief Complaint: Cough and SOB HPI: The patient is a 69 y.o. year-old w/ hx HIV, treated hep C who has been sick for 10days, was exposed to COVID at a barbecue the day prior to getting sick. Has quarantined herself af home, still having fevers and other symptoms but altogether feeling better however has prod cough and SOB which aren't improving so came to ED.  In ED CXR shows bibasilar infiltrates, SpO2 in ED was 87%, 94% on 2 L [place]. Labs showed WBC 3,  Hb 16, CRP 1.8.  COVID -19 positive (+). Pt given IV decadron 10mg , we are called to admit pt for COVID pna w/ hypoxemia.   Pt states she was at a barbecue and the next day (10d prior to today) became very ill w/ severe fevers, chills, and bodyaches.  This persisted for many days, accompanied by prod cough w/ tan secretions, SOB, nausea , mild emesis, mild diarrhea, +loss of taste and smell.  She is still have sweats and fevers, but main reason for coming to ED was continued cough and some SOB.  Pt has not been vaccinated for COVID-19, but says today that "I wish I would have done it".    Pt was treated for hep C in the past successfully.  She has HIV f/b by ID here on therapy w/ undetectable virus on her last testing.  No other health issues.   03/15/20: Seen and examined at bedside.  Reports odynophagia, states that she has yeast down her throat.  No prior history of recent EGD.  Denies any GI symptoms.  Reports intermittent pleuritic pain worse with deep breathing.    Assessment/Plan: Principal Problem:   Pneumonia due to COVID-19 virus Active Problems:   Human immunodeficiency virus (HIV) disease ([place])   Hepatitis C - treated   History of colon cancer, stage II   Cough   Opioid dependence ([place])   Acute hypoxemic respiratory failure (H[facility])   SOB (shortness of breath)  COVID-19 viral  pneumonia Independently viewed chest x-ray done on admission which showed bilateral infiltrates at bases. Started on remdesivir, IV steroids, bronchodilators, pulmonary toilet, incentive spirometer. Continue vitamin C, D3, zinc Mobilize as tolerated Pronate or side sleeping Continue to monitor inflammatory markers daily  Acute hypoxic respiratory failure secondary to COVID-19 viral pneumonia Not on oxygen supplementation at baseline Maintain O2 saturation greater than 92% Rest of management as stated above  Odynophagia, suspect viral as an etiology Patient is convinced that she has esophageal yeast States she felt better after Diflucan given on admission No recent EGD to confirm her suspicion We will give Diflucan 150 mg daily x3 doses. We will continue to monitor closely  Leukopenia, in the setting of viral infection Presented with WBC 3.8, downtrending, 2.6.   Continue to monitor. Repeat CBC in the morning.  Thrombocytopenia Likely secondary to acute illness Platelet 120 K Continue to monitor.  Mild hyperkalemia Presented with serum potassium of 5.2 Improving, serum potassium 4.8  Opioid dependence Pharmacy consulted for methadone  HIV on ART Undetectable virus on last testing 12/30/2019. Will need to follow-up with ID posthospitalization  History of treated hepatitis C History of R colon cancer status post right colectomy in 2014 Was followed by Dr. Benay Spice, last visit was in 2019, she is in remission.  Possible cirrhosis, seen by GI in 2015 Continue to avoid hepatotoxic agents  Code Status: Full code  Family Communication: None at bedside.  Disposition Plan: Home   Consultants:  None  Procedures:  None  Antimicrobials:  None  DVT prophylaxis: Subcu Lovenox daily  Status is: Inpatient    Dispo:  Patient From: Home  Planned Disposition: Home  Expected discharge date: 03/17/20  Medically stable for discharge: No, ongoing management  of COVID-19 viral pneumonia.         Objective: Vitals:   03/15/20 0012 03/15/20 0425 03/15/20 0736 03/15/20 1157  BP: 121/77 119/65 (!) 127/92 130/72  Pulse: 61 (!) 56 70 65  Resp: 18 20 20 20   Temp: 98.5 F (36.9 C) 97.8 F (36.6 C) 98.2 F (36.8 C) 98.4 F (36.9 C)  TempSrc: Oral Oral Oral Oral  SpO2: 95% 93% 100% 92%  Weight: 81.7 kg     Height: 5\' 3"  (1.6 m)       Intake/Output Summary (Last 24 hours) at 03/15/2020 1514 Last data filed at 03/15/2020 1100 Gross per 24 hour  Intake 175 ml  Output --  Net 175 ml   Filed Weights   03/14/20 1625 03/15/20 0012  Weight: 81 kg 81.7 kg    Exam:  . General: 69 y.o. year-old female well developed well nourished in no acute distress.  Alert and oriented x3. . Cardiovascular: Regular rate and rhythm with no rubs or gallops.  No thyromegaly or JVD noted.   Marland Kitchen Respiratory: Mild rales at bases bilaterally.  No wheezing noted.  Good inspiratory effort.   . Abdomen: Soft nontender nondistended with normal bowel sounds x4 quadrants. . Musculoskeletal: Trace lower extremity edema bilaterally. Marland Kitchen Psychiatry: Mood is appropriate for condition and setting   Data Reviewed: CBC: Recent Labs  Lab 03/14/20 1849 03/15/20 0421  WBC 3.8* 2.6*  NEUTROABS 1.9 1.7  HGB 16.4* 15.8*  HCT 50.7* 49.5*  MCV 101.6* 102.7*  PLT 150 443*   Basic Metabolic Panel: Recent Labs  Lab 03/14/20 1849 03/15/20 0833  NA 139 133*  K 5.2* 4.8  CL 98 98  CO2 29 26  GLUCOSE 96 131*  BUN 24* 18  CREATININE 0.95 0.74  CALCIUM 9.8 9.3  MG  --  2.0  PHOS  --  2.7   GFR: Estimated Creatinine Clearance: 69.1 mL/min (by C-G formula based on SCr of 0.74 mg/dL). Liver Function Tests: Recent Labs  Lab 03/14/20 1849 03/15/20 0833  AST 29 29  ALT 18 18  ALKPHOS 63 60  BILITOT 0.8 1.0  PROT 9.4* 9.1*  ALBUMIN 3.6 3.5   No results for input(s): LIPASE, AMYLASE in the last 168 hours. No results for input(s): AMMONIA in the last 168  hours. Coagulation Profile: No results for input(s): INR, PROTIME in the last 168 hours. Cardiac Enzymes: No results for input(s): CKTOTAL, CKMB, CKMBINDEX, TROPONINI in the last 168 hours. BNP (last 3 results) No results for input(s): PROBNP in the last 8760 hours. HbA1C: No results for input(s): HGBA1C in the last 72 hours. CBG: No results for input(s): GLUCAP in the last 168 hours. Lipid Profile: Recent Labs    03/14/20 1849  TRIG 77   Thyroid Function Tests: No results for input(s): TSH, T4TOTAL, FREET4, T3FREE, THYROIDAB in the last 72 hours. Anemia Panel: Recent Labs    03/14/20 1849 03/15/20 0833  FERRITIN 255 281   Urine analysis:    Component Value Date/Time   COLORURINE AMBER (A) 05/04/2017 1831   APPEARANCEUR HAZY (A) 05/04/2017 1831   LABSPEC 1.016 05/04/2017 1831  PHURINE 5.0 05/04/2017 1831   GLUCOSEU NEGATIVE 05/04/2017 1831   HGBUR SMALL (A) 05/04/2017 1831   BILIRUBINUR NEGATIVE 05/04/2017 1831   KETONESUR NEGATIVE 05/04/2017 1831   PROTEINUR 100 (A) 05/04/2017 1831   UROBILINOGEN 1.0 03/25/2015 2034   NITRITE NEGATIVE 05/04/2017 1831   LEUKOCYTESUR NEGATIVE 05/04/2017 1831   Sepsis Labs: @LABRCNTIP (procalcitonin:4,lacticidven:4)  ) Recent Results (from the past 240 hour(s))  SARS Coronavirus 2 by RT PCR (hospital order, performed in [place] hospital lab) Nasopharyngeal Nasopharyngeal Swab     Status: Abnormal   Collection Time: 03/14/20  6:47 PM   Specimen: Nasopharyngeal Swab  Result Value Ref Range Status   SARS Coronavirus 2 POSITIVE (A) NEGATIVE Final    Comment: RESULT CALLED TO, READ BACK BY AND VERIFIED WITH: GOODWIN,REBECCA,RN @ 2115 03/14/20 BY TURNER,S. (NOTE) SARS-CoV-2 target nucleic acids are DETECTED  SARS-CoV-2 RNA is generally detectable in upper respiratory specimens  during the acute phase of infection.  Positive results are indicative  of the presence of the identified virus, but do not rule out bacterial infection  or co-infection with other pathogens not detected by the test.  Clinical correlation with patient history and  other diagnostic information is necessary to determine patient infection status.  The expected result is negative.  Fact Sheet for Patients:   StrictlyIdeas.no   Fact Sheet for Healthcare Providers:   BankingDealers.co.za    This test is not yet approved or cleared by the [place] FDA and  has been authorized for detection and/or diagnosis of SARS-CoV-2 by FDA under an Emergency Use Authorization (EUA).  This EUA will remain in effect (mea ning this test can be used) for the duration of  the COVID-19 declaration under Section 564(b)(1) of the Act, 21 U.S.C. section 360-bbb-3(b)(1), unless the authorization is terminated or revoked sooner.  Performed at [facility], [place] [address], [place], [place] [address]       Studies: DG Chest Port 1 View  Result Date: 03/14/2020 CLINICAL DATA:  Cough.  COVID-19. EXAM: PORTABLE CHEST 1 VIEW COMPARISON:  September 09, 2018 FINDINGS: There is infiltrate in the periphery of the right base. There is infiltrate in the periphery of the left base. The heart, hila, mediastinum, lungs, and pleura are otherwise unremarkable. No pneumothorax. IMPRESSION: Peripheral bibasilar infiltrates, left greater than right, consistent with the patient's history of COVID-19. Electronically Signed   By: Dorise Bullion III M.D   On: 03/14/2020 18:37    Scheduled Meds: . albuterol  2 puff Inhalation Q6H  . vitamin C  500 mg Oral Daily  . Darunavir-Cobicisctat-Emtricitabine-Tenofovir Alafenamide  1 tablet Oral Q breakfast  . dexamethasone (DECADRON) injection  6 mg Intravenous Q24H  . enoxaparin (LOVENOX) injection  40 mg Subcutaneous Q24H  . fluconazole  150 mg Oral Daily  . methadone  90 mg Oral Daily  . sulfamethoxazole-trimethoprim  1 tablet Oral Daily  . zinc sulfate  220 mg Oral Daily     Continuous Infusions: . remdesivir 100 mg in NS 100 mL 100 mg (03/15/20 1036)     LOS: 1 day     Kayleen Memos, MD [facility] Pager [phone]  If 7PM-7AM, please contact night-coverage [email or web address] Password [facility] 03/15/2020, 3:14 PM

## 2020-03-15 NOTE — TOC Progression Note (Signed)
Transition of Care [facility]) - Progression Note    Patient Details  Name: Ruth Stephenson MRN: [id number] Date of Birth: 1950/12/14  Transition of Care [facility]) CM/SW Contact  Purcell Mouton, RN Phone Number: 03/15/2020, 2:54 PM  Clinical Narrative:     Pt from home and plan to return when stable.   Expected Discharge Plan: Home/Self Care Barriers to Discharge: No Barriers Identified  Expected Discharge Plan and Services Expected Discharge Plan: Home/Self Care       Living arrangements for the past 2 months: Single Family Home                                       Social Determinants of Health (SDOH) Interventions    Readmission Risk Interventions No flowsheet data found.

## 2020-03-16 LAB — D-DIMER, QUANTITATIVE: D-Dimer, Quant: 9.4 ug/mL-FEU — ABNORMAL HIGH (ref 0.00–0.50)

## 2020-03-16 LAB — CBC WITH DIFFERENTIAL/PLATELET
Abs Immature Granulocytes: 0.18 10*3/uL — ABNORMAL HIGH (ref 0.00–0.07)
Basophils Absolute: 0 10*3/uL (ref 0.0–0.1)
Basophils Relative: 0 %
Eosinophils Absolute: 0 10*3/uL (ref 0.0–0.5)
Eosinophils Relative: 0 %
HCT: 48 % — ABNORMAL HIGH (ref 36.0–46.0)
Hemoglobin: 15.8 g/dL — ABNORMAL HIGH (ref 12.0–15.0)
Immature Granulocytes: 2 %
Lymphocytes Relative: 11 %
Lymphs Abs: 1.1 10*3/uL (ref 0.7–4.0)
MCH: 33.1 pg (ref 26.0–34.0)
MCHC: 32.9 g/dL (ref 30.0–36.0)
MCV: 100.6 fL — ABNORMAL HIGH (ref 80.0–100.0)
Monocytes Absolute: 1 10*3/uL (ref 0.1–1.0)
Monocytes Relative: 9 %
Neutro Abs: 8.4 10*3/uL — ABNORMAL HIGH (ref 1.7–7.7)
Neutrophils Relative %: 78 %
Platelets: 171 10*3/uL (ref 150–400)
RBC: 4.77 MIL/uL (ref 3.87–5.11)
RDW: 12.3 % (ref 11.5–15.5)
WBC: 10.7 10*3/uL — ABNORMAL HIGH (ref 4.0–10.5)
nRBC: 0 % (ref 0.0–0.2)

## 2020-03-16 LAB — HEMOGLOBIN A1C
Hgb A1c MFr Bld: 5.8 % — ABNORMAL HIGH (ref 4.8–5.6)
Mean Plasma Glucose: 119.76 mg/dL

## 2020-03-16 LAB — GLUCOSE, CAPILLARY
Glucose-Capillary: 104 mg/dL — ABNORMAL HIGH (ref 70–99)
Glucose-Capillary: 140 mg/dL — ABNORMAL HIGH (ref 70–99)
Glucose-Capillary: 223 mg/dL — ABNORMAL HIGH (ref 70–99)
Glucose-Capillary: 174 mg/dL — ABNORMAL HIGH (ref 70–99)

## 2020-03-16 LAB — FERRITIN: Ferritin: 264 ng/mL (ref 11–307)

## 2020-03-16 LAB — C-REACTIVE PROTEIN: CRP: 0.5 mg/dL (ref <1.0)

## 2020-03-16 MED ORDER — METHYLPREDNISOLONE SODIUM SUCC 125 MG IJ SOLR
40.0000 mg | Freq: Two times a day (BID) | INTRAMUSCULAR | Status: DC
  Administered 2020-03-16 – 2020-03-17 (×3): 40 mg via INTRAVENOUS
  Filled 2020-03-16 (×3): qty 2

## 2020-03-16 MED ORDER — INSULIN ASPART 100 UNIT/ML ~~LOC~~ SOLN
0.0000 [IU] | Freq: Three times a day (TID) | SUBCUTANEOUS | Status: DC
  Administered 2020-03-16: 2 [IU] via SUBCUTANEOUS
  Administered 2020-03-17 – 2020-03-18 (×3): 1 [IU] via SUBCUTANEOUS

## 2020-03-16 MED ORDER — VITAMIN D 25 MCG (1000 UNIT) PO TABS
1000.0000 [IU] | ORAL_TABLET | Freq: Every day | ORAL | Status: DC
  Administered 2020-03-16 – 2020-03-18 (×3): 1000 [IU] via ORAL
  Filled 2020-03-16 (×3): qty 1

## 2020-03-16 MED ORDER — IPRATROPIUM BROMIDE HFA 17 MCG/ACT IN AERS
2.0000 | INHALATION_SPRAY | Freq: Four times a day (QID) | RESPIRATORY_TRACT | Status: DC
  Administered 2020-03-16 – 2020-03-18 (×9): 2 via RESPIRATORY_TRACT
  Filled 2020-03-16: qty 12.9

## 2020-03-16 MED ORDER — ALBUTEROL SULFATE HFA 108 (90 BASE) MCG/ACT IN AERS
2.0000 | INHALATION_SPRAY | Freq: Four times a day (QID) | RESPIRATORY_TRACT | Status: DC
  Administered 2020-03-16 – 2020-03-18 (×9): 2 via RESPIRATORY_TRACT

## 2020-03-16 NOTE — Progress Notes (Signed)
SATURATION QUALIFICATIONS: (This note is used to comply with regulatory documentation for home oxygen)  Patient Saturations on Room Air at Rest = 93%  Patient Saturations on Room Air while Ambulating = 90%   Patient Saturations on  Liters of oxygen while Ambulating = N/A  Please briefly explain why patient needs home oxygen: 

## 2020-03-16 NOTE — Plan of Care (Signed)
  Problem: Education: Goal: Knowledge of risk factors and measures for prevention of condition will improve Outcome: Progressing   Problem: Coping: Goal: Psychosocial and spiritual needs will be supported Outcome: Progressing   Problem: Respiratory: Goal: Will maintain a patent airway Outcome: Progressing Goal: Complications related to the disease process, condition or treatment will be avoided or minimized Outcome: Progressing   

## 2020-03-16 NOTE — Progress Notes (Signed)
PROGRESS NOTE  Ruth Stephenson:[id number] DOB: 08-11-1950 DOA: 03/14/2020 PCP: Patient, No Pcp Per  HPI/Recap of past 24 hours: Chief Complaint: Cough and SOB HPI: The patient is a 69 y.o. year-old w/ hx HIV, treated hep C who has been sick for 10days, was exposed to COVID at a barbecue the day prior to getting sick. Has quarantined herself af home, still having fevers and other symptoms but altogether feeling better however has prod cough and SOB which aren't improving so came to ED.  In ED CXR shows bibasilar infiltrates, SpO2 in ED was 87%, 94% on 2 L [place]. Labs showed WBC 3,  Hb 16, CRP 1.8.  COVID -19 positive (+). Pt given IV decadron 10mg , we are called to admit pt for COVID pna w/ hypoxemia.   Pt states she was at a barbecue and the next day (10d prior to today) became very ill w/ severe fevers, chills, and bodyaches.  This persisted for many days, accompanied by prod cough w/ tan secretions, SOB, nausea , mild emesis, mild diarrhea, +loss of taste and smell.  She is still have sweats and fevers, but main reason for coming to ED was continued cough and some SOB.  Pt has not been vaccinated for COVID-19, but says today that "I wish I would have done it".    Pt was treated for hep C in the past successfully.  She has HIV f/b by ID here on therapy w/ undetectable virus on her last testing.  No other health issues.   03/16/20: Seen and examined at bedside.  Reports she feels better this morning and dyspnea has improved.     Assessment/Plan: Principal Problem:   Pneumonia due to COVID-19 virus Active Problems:   Human immunodeficiency virus (HIV) disease ([place])   Hepatitis C - treated   History of colon cancer, stage II   Cough   Opioid dependence ([place])   Acute hypoxemic respiratory failure (HCC)   SOB (shortness of breath)  COVID-19 viral pneumonia Independently viewed chest x-ray done on admission which showed bilateral infiltrates at bases. Started on remdesivir, IV steroids,  bronchodilators, pulmonary toilet, incentive spirometer. Continue vitamin C, D3, zinc Continue remdesivir day number 3 out of 5. Mobilize as tolerated Pronate or side sleeping Inflammatory markers are downtrending. Continue to monitor inflammatory markers daily  Improving acute hypoxic respiratory failure secondary to COVID-19 viral pneumonia Not on oxygen supplementation at baseline Maintain O2 saturation greater than 92% Rest of management as stated above  Improving odynophagia, suspect viral as an etiology Patient is convinced that she has esophageal yeast States she felt better after Diflucan given on admission No recent EGD to confirm her suspicion We will give Diflucan 150 mg daily x 2/3 doses. We will continue to monitor closely  Resolved leukopenia, in the setting of viral infection Presented with WBC 3.8, not uptrending 10.7K. Continue to monitor. Repeat CBC with differential in the morning.  Resolved thrombocytopenia Likely secondary to acute illness Platelet 120 K> 171K Continue to monitor.  Resolved mild hyperkalemia Presented with serum potassium of 5.2> 4.8  Opioid dependence Pharmacy consulted for methadone  HIV on ART Undetectable virus on last testing 12/30/2019. Will need to follow-up with ID posthospitalization  History of treated hepatitis C History of R colon cancer status post right colectomy in 2014 Was followed by Dr. Benay Spice, last visit was in 2019, she is in remission.  Possible cirrhosis, seen by GI in 2015 Continue to avoid hepatotoxic agents     Code  Status: Full code  Family Communication: None at bedside.  Disposition Plan: Home   Consultants:  None  Procedures:  None  Antimicrobials:  None  DVT prophylaxis: Subcu Lovenox daily  Status is: Inpatient    Dispo:  Patient From: Home  Planned Disposition: Home  Expected discharge date: 03/17/20  Medically stable for discharge: No, ongoing management of COVID-19  viral pneumonia.         Objective: Vitals:   03/15/20 1157 03/15/20 2156 03/16/20 1306 03/16/20 1350  BP: 130/72 123/64 123/78   Pulse: 65 (!) 57 (!) 56   Resp: 20 18 16    Temp: 98.4 F (36.9 C) 98.6 F (37 C) 98.8 F (37.1 C)   TempSrc: Oral  Oral   SpO2: 92% 90% (!) 89% 93%  Weight:      Height:        Intake/Output Summary (Last 24 hours) at 03/16/2020 1604 Last data filed at 03/15/2020 1800 Gross per 24 hour  Intake 10 ml  Output --  Net 10 ml   Filed Weights   03/14/20 1625 03/15/20 0012  Weight: 81 kg 81.7 kg    Exam:  . General: 69 y.o. year-old female pleasant well-developed well-nourished no acute stress.  Alert and oriented x3.   . Cardiovascular: Regular rate and rhythm no rubs or gallops.   Marland Kitchen Respiratory: Mild rales at bases no wheezing noted.  Good inspiratory effort. . Abdomen: Soft nontender normal bowel sounds present. . Musculoskeletal: No lower extremity edema bilaterally.   Marland Kitchen Psychiatry: Mood is appropriate for condition and setting.   Data Reviewed: CBC: Recent Labs  Lab 03/14/20 1849 03/15/20 0421 03/16/20 0432  WBC 3.8* 2.6* 10.7*  NEUTROABS 1.9 1.7 8.4*  HGB 16.4* 15.8* 15.8*  HCT 50.7* 49.5* 48.0*  MCV 101.6* 102.7* 100.6*  PLT 150 120* 342   Basic Metabolic Panel: Recent Labs  Lab 03/14/20 1849 03/15/20 0833  NA 139 133*  K 5.2* 4.8  CL 98 98  CO2 29 26  GLUCOSE 96 131*  BUN 24* 18  CREATININE 0.95 0.74  CALCIUM 9.8 9.3  MG  --  2.0  PHOS  --  2.7   GFR: Estimated Creatinine Clearance: 69.1 mL/min (by C-G formula based on SCr of 0.74 mg/dL). Liver Function Tests: Recent Labs  Lab 03/14/20 1849 03/15/20 0833  AST 29 29  ALT 18 18  ALKPHOS 63 60  BILITOT 0.8 1.0  PROT 9.4* 9.1*  ALBUMIN 3.6 3.5   No results for input(s): LIPASE, AMYLASE in the last 168 hours. No results for input(s): AMMONIA in the last 168 hours. Coagulation Profile: No results for input(s): INR, PROTIME in the last 168  hours. Cardiac Enzymes: No results for input(s): CKTOTAL, CKMB, CKMBINDEX, TROPONINI in the last 168 hours. BNP (last 3 results) No results for input(s): PROBNP in the last 8760 hours. HbA1C: Recent Labs    03/16/20 0806  HGBA1C 5.8*   CBG: Recent Labs  Lab 03/16/20 0812 03/16/20 1302  GLUCAP 104* 140*   Lipid Profile: Recent Labs    03/14/20 1849  TRIG 77   Thyroid Function Tests: No results for input(s): TSH, T4TOTAL, FREET4, T3FREE, THYROIDAB in the last 72 hours. Anemia Panel: Recent Labs    03/15/20 0833 03/16/20 0806  FERRITIN 281 264   Urine analysis:    Component Value Date/Time   COLORURINE AMBER (A) 05/04/2017 1831   APPEARANCEUR HAZY (A) 05/04/2017 1831   LABSPEC 1.016 05/04/2017 1831   PHURINE 5.0 05/04/2017 1831  GLUCOSEU NEGATIVE 05/04/2017 1831   HGBUR SMALL (A) 05/04/2017 1831   BILIRUBINUR NEGATIVE 05/04/2017 1831   KETONESUR NEGATIVE 05/04/2017 1831   PROTEINUR 100 (A) 05/04/2017 1831   UROBILINOGEN 1.0 03/25/2015 2034   NITRITE NEGATIVE 05/04/2017 1831   LEUKOCYTESUR NEGATIVE 05/04/2017 1831   Sepsis Labs: @LABRCNTIP (procalcitonin:4,lacticidven:4)  ) Recent Results (from the past 240 hour(s))  SARS Coronavirus 2 by RT PCR (hospital order, performed in [facility] hospital lab) Nasopharyngeal Nasopharyngeal Swab     Status: Abnormal   Collection Time: 03/14/20  6:47 PM   Specimen: Nasopharyngeal Swab  Result Value Ref Range Status   SARS Coronavirus 2 POSITIVE (A) NEGATIVE Final    Comment: RESULT CALLED TO, READ BACK BY AND VERIFIED WITH: GOODWIN,REBECCA,RN @ 2115 03/14/20 BY TURNER,S. (NOTE) SARS-CoV-2 target nucleic acids are DETECTED  SARS-CoV-2 RNA is generally detectable in upper respiratory specimens  during the acute phase of infection.  Positive results are indicative  of the presence of the identified virus, but do not rule out bacterial infection or co-infection with other pathogens not detected by the test.  Clinical  correlation with patient history and  other diagnostic information is necessary to determine patient infection status.  The expected result is negative.  Fact Sheet for Patients:   StrictlyIdeas.no   Fact Sheet for Healthcare Providers:   BankingDealers.co.za    This test is not yet approved or cleared by the [place] FDA and  has been authorized for detection and/or diagnosis of SARS-CoV-2 by FDA under an Emergency Use Authorization (EUA).  This EUA will remain in effect (mea ning this test can be used) for the duration of  the COVID-19 declaration under Section 564(b)(1) of the Act, 21 U.S.C. section 360-bbb-3(b)(1), unless the authorization is terminated or revoked sooner.  Performed at [facility], [place] [address] [place], [place] [address]   Blood Culture (routine x 2)     Status: None (Preliminary result)   Collection Time: 03/14/20  6:49 PM   Specimen: BLOOD LEFT FOREARM  Result Value Ref Range Status   Specimen Description   Final    BLOOD LEFT FOREARM Performed at [facility] [address], [place], [place] [address]    Special Requests   Final    BOTTLES DRAWN AEROBIC AND ANAEROBIC Blood Culture adequate volume Performed at [facility] [address], [place], [place] [address]    Culture   Final    NO GROWTH 1 DAY Performed at [facility], [place] [address], [place], [place] [address]    Report Status PENDING  Incomplete  Blood Culture (routine x 2)     Status: None (Preliminary result)   Collection Time: 03/14/20  7:29 PM   Specimen: BLOOD  Result Value Ref Range Status   Specimen Description   Final    BLOOD LEFT ARM Performed at [facility] [address] [place], [place] [address]    Special Requests   Final    BOTTLES DRAWN AEROBIC AND ANAEROBIC Blood Culture results may not be optimal due to an inadequate volume of  blood received in culture bottles Performed at [facility] [address] [place], [place] [address]    Culture   Final    NO GROWTH 1 DAY Performed at [facility], [place] [address], [place], [place] [address]    Report Status PENDING  Incomplete      Studies: No results found.  Scheduled  Meds: . albuterol  2 puff Inhalation Q6H  . vitamin C  500 mg Oral Daily  . cholecalciferol  1,000 Units Oral Daily  . Darunavir-Cobicisctat-Emtricitabine-Tenofovir Alafenamide  1 tablet Oral Q breakfast  . enoxaparin (LOVENOX) injection  40 mg Subcutaneous Q24H  . fluconazole  150 mg Oral Daily  . insulin aspart  0-6 Units Subcutaneous TID WC  . ipratropium  2 puff Inhalation Q6H  . methadone  90 mg Oral Daily  . methylPREDNISolone (SOLU-MEDROL) injection  40 mg Intravenous BID  . sulfamethoxazole-trimethoprim  1 tablet Oral Daily  . zinc sulfate  220 mg Oral Daily    Continuous Infusions: . remdesivir 100 mg in NS 100 mL 100 mg (03/16/20 0920)     LOS: 2 days     Kayleen Memos, MD [facility] Pager [phone]  If 7PM-7AM, please contact night-coverage [email or web address] Password [facility] 03/16/2020, 4:04 PM

## 2020-03-17 DIAGNOSIS — B182 Chronic viral hepatitis C: Secondary | ICD-10-CM

## 2020-03-17 LAB — D-DIMER, QUANTITATIVE: D-Dimer, Quant: 0.48 ug/mL-FEU (ref 0.00–0.50)

## 2020-03-17 LAB — CBC WITH DIFFERENTIAL/PLATELET
Abs Immature Granulocytes: 0.12 10*3/uL — ABNORMAL HIGH (ref 0.00–0.07)
Basophils Absolute: 0 10*3/uL (ref 0.0–0.1)
Basophils Relative: 0 %
Eosinophils Absolute: 0 10*3/uL (ref 0.0–0.5)
Eosinophils Relative: 0 %
HCT: 48.4 % — ABNORMAL HIGH (ref 36.0–46.0)
Hemoglobin: 15.7 g/dL — ABNORMAL HIGH (ref 12.0–15.0)
Immature Granulocytes: 2 %
Lymphocytes Relative: 11 %
Lymphs Abs: 0.8 10*3/uL (ref 0.7–4.0)
MCH: 33.5 pg (ref 26.0–34.0)
MCHC: 32.4 g/dL (ref 30.0–36.0)
MCV: 103.2 fL — ABNORMAL HIGH (ref 80.0–100.0)
Monocytes Absolute: 0.5 10*3/uL (ref 0.1–1.0)
Monocytes Relative: 6 %
Neutro Abs: 6.2 10*3/uL (ref 1.7–7.7)
Neutrophils Relative %: 81 %
Platelets: 132 10*3/uL — ABNORMAL LOW (ref 150–400)
RBC: 4.69 MIL/uL (ref 3.87–5.11)
RDW: 12.5 % (ref 11.5–15.5)
WBC: 7.6 10*3/uL (ref 4.0–10.5)
nRBC: 0 % (ref 0.0–0.2)

## 2020-03-17 LAB — GLUCOSE, CAPILLARY
Glucose-Capillary: 102 mg/dL — ABNORMAL HIGH (ref 70–99)
Glucose-Capillary: 194 mg/dL — ABNORMAL HIGH (ref 70–99)
Glucose-Capillary: 175 mg/dL — ABNORMAL HIGH (ref 70–99)
Glucose-Capillary: 207 mg/dL — ABNORMAL HIGH (ref 70–99)

## 2020-03-17 LAB — COMPREHENSIVE METABOLIC PANEL
ALT: 23 U/L (ref 0–44)
AST: 30 U/L (ref 15–41)
Albumin: 3.4 g/dL — ABNORMAL LOW (ref 3.5–5.0)
Alkaline Phosphatase: 63 U/L (ref 38–126)
Anion gap: 10 (ref 5–15)
BUN: 20 mg/dL (ref 8–23)
CO2: 26 mmol/L (ref 22–32)
Calcium: 10.1 mg/dL (ref 8.9–10.3)
Chloride: 97 mmol/L — ABNORMAL LOW (ref 98–111)
Creatinine, Ser: 0.73 mg/dL (ref 0.44–1.00)
GFR calc Af Amer: >60 mL/min (ref >60)
GFR calc non Af Amer: >60 mL/min (ref >60)
Glucose, Bld: 223 mg/dL — ABNORMAL HIGH (ref 70–99)
Potassium: 5.7 mmol/L — ABNORMAL HIGH (ref 3.5–5.1)
Sodium: 133 mmol/L — ABNORMAL LOW (ref 135–145)
Total Bilirubin: 0.6 mg/dL (ref 0.3–1.2)
Total Protein: 8.6 g/dL — ABNORMAL HIGH (ref 6.5–8.1)

## 2020-03-17 LAB — FERRITIN: Ferritin: 245 ng/mL (ref 11–307)

## 2020-03-17 LAB — MAGNESIUM: Magnesium: 1.9 mg/dL (ref 1.7–2.4)

## 2020-03-17 LAB — PHOSPHORUS: Phosphorus: 3.2 mg/dL (ref 2.5–4.6)

## 2020-03-17 LAB — C-REACTIVE PROTEIN: CRP: 0.5 mg/dL (ref <1.0)

## 2020-03-17 MED ORDER — PREDNISONE 20 MG PO TABS
40.0000 mg | ORAL_TABLET | Freq: Every day | ORAL | Status: DC
  Administered 2020-03-18: 40 mg via ORAL
  Filled 2020-03-17: qty 2

## 2020-03-17 NOTE — Progress Notes (Addendum)
PROGRESS NOTE    Ruth Stephenson  MEQ:[id number] DOB: Feb 14, 1951 DOA: 03/14/2020 PCP: Patient, No Pcp Per    Brief Narrative:  Patient admitted to the hospital with working diagnosis of acute hypoxic respiratory failure due to SARS COVID-19 viral pneumonia.   69 year old female with past medical history of HIV and hepatitis C who reports not feeling well for about 10 days.  She had positive exposure to COVID-19.  Not vaccinated for COVID-19.  She had persistent cough and dyspnea that prompted her to come to the hospital.  On her initial physical examination her oxygenation was 87% on room air.  Her lungs had scattered wheezing and rhonchi, heart S1-S2, present rhythmic, soft abdomen, no lower extremity edema.  Her chest radiograph had bilateral lower lobes interstitial infiltrates peripherally.  Patient has been placed on remdesivir and systemic steroids with good toleration.  Assessment & Plan:   Principal Problem:   Pneumonia due to COVID-19 virus Active Problems:   Human immunodeficiency virus (HIV) disease ([place])   Hepatitis C - treated   History of colon cancer, stage II   Cough   Opioid dependence ([place])   Acute hypoxemic respiratory failure (HCC)   SOB (shortness of breath)   1.  Acute hypoxic respiratory failure due to SARS COVID-19 viral pneumonia.  RR: 18  Pulse oxymetry: 92% Fi02: 21% room air   COVID-19 Labs  Recent Labs    03/14/20 1849 03/14/20 1849 03/15/20 0421 03/15/20 0833 03/16/20 0432 03/16/20 0806 03/17/20 0521  DDIMER 1.27*   < > 2.62*  --  9.40*  --  0.48  FERRITIN 255   < >  --  281  --  264 245  LDH 155  --   --   --   --   --   --   CRP 1.8*   < >  --  1.4*  --  0.5 0.5   < > = values in this interval not displayed.    Lab Results  Component Value Date   [place] (A) 03/14/2020    Patient continue recovering well but not yet back to baseline.   Continue medical therapy with Remdesivir #4, systemic steroids,  bronchodilators and antitussive agents.  Out of bed as tolerated. Plan to dc home in am after completing las dose of remdesivir in am.   2. Right arm phlebitis. Continue local care.   3. HIV. Continue antiretroviral therapy and prophylaxis with bactrim,  4. Chronic pain syndrome. Continue with methadone.  5. Steroid induced hyperglycemia. Prediabetes, Hgb A1c 5,8. Glucose control with insulin sliding scale, patient is tolerating po well.     Status is: Inpatient  Remains inpatient appropriate because:IV treatments appropriate due to intensity of illness or inability to take PO   Dispo:  Patient From: Home  Planned Disposition: Home  Expected discharge date: 09/23   Medically stable for discharge: No   DVT prophylaxis:  enoxaparin   Code Status:   full  Family Communication: no family at the bedside        Subjective: Patient is feeling better but not yet back to baseline, no nausea or vomiting no chest pain. Positive right forearm pain and edema.   Objective: Vitals:   03/16/20 1837 03/16/20 2040 03/17/20 0535 03/17/20 1200  BP:  (!) 149/78 128/70 133/73  Pulse:  (!) 58 62 63  Resp:  20 20 18   Temp:  98.1 F (36.7 C) 98.2 F (36.8 C) 98.7 F (37.1 C)  TempSrc:  Oral  Oral Oral  SpO2: 96% 95% 97% 92%  Weight:      Height:       No intake or output data in the 24 hours ending 03/17/20 1343 Filed Weights   03/14/20 1625 03/15/20 0012  Weight: 81 kg 81.7 kg    Examination:   General: Not in pain or dyspnea Neurology: Awake and alert, non focal  E ENT: no pallor, no icterus, oral mucosa moist Cardiovascular: No JVD. S1-S2 present, rhythmic, no gallops, rubs, or murmurs. No lower extremity edema. Pulmonary: positive breath sounds bilaterally Gastrointestinal. Abdomen soft and non tender Skin. No rashes Musculoskeletal: no joint deformities/ right forearm edema     Data Reviewed: I have personally reviewed following labs and imaging studies  CBC: Recent  Labs  Lab 03/14/20 1849 03/15/20 0421 03/16/20 0432 03/17/20 0521  WBC 3.8* 2.6* 10.7* 7.6  NEUTROABS 1.9 1.7 8.4* 6.2  HGB 16.4* 15.8* 15.8* 15.7*  HCT 50.7* 49.5* 48.0* 48.4*  MCV 101.6* 102.7* 100.6* 103.2*  PLT 150 120* 171 161*   Basic Metabolic Panel: Recent Labs  Lab 03/14/20 1849 03/15/20 0833 03/17/20 0521  NA 139 133* 133*  K 5.2* 4.8 5.7*  CL 98 98 97*  CO2 29 26 26   GLUCOSE 96 131* 223*  BUN 24* 18 20  CREATININE 0.95 0.74 0.73  CALCIUM 9.8 9.3 10.1  MG  --  2.0 1.9  PHOS  --  2.7 3.2   GFR: Estimated Creatinine Clearance: 69.1 mL/min (by C-G formula based on SCr of 0.73 mg/dL). Liver Function Tests: Recent Labs  Lab 03/14/20 1849 03/15/20 0833 03/17/20 0521  AST 29 29 30   ALT 18 18 23   ALKPHOS 63 60 63  BILITOT 0.8 1.0 0.6  PROT 9.4* 9.1* 8.6*  ALBUMIN 3.6 3.5 3.4*   No results for input(s): LIPASE, AMYLASE in the last 168 hours. No results for input(s): AMMONIA in the last 168 hours. Coagulation Profile: No results for input(s): INR, PROTIME in the last 168 hours. Cardiac Enzymes: No results for input(s): CKTOTAL, CKMB, CKMBINDEX, TROPONINI in the last 168 hours. BNP (last 3 results) No results for input(s): PROBNP in the last 8760 hours. HbA1C: Recent Labs    03/16/20 0806  HGBA1C 5.8*   CBG: Recent Labs  Lab 03/16/20 1302 03/16/20 1737 03/16/20 2122 03/17/20 0742 03/17/20 1156  GLUCAP 140* 223* 174* 102* 194*   Lipid Profile: Recent Labs    03/14/20 1849  TRIG 77   Thyroid Function Tests: No results for input(s): TSH, T4TOTAL, FREET4, T3FREE, THYROIDAB in the last 72 hours. Anemia Panel: Recent Labs    03/16/20 0806 03/17/20 0521  FERRITIN 264 245      Radiology Studies: I have reviewed all of the imaging during this hospital visit personally     Scheduled Meds: . albuterol  2 puff Inhalation Q6H  . vitamin C  500 mg Oral Daily  . cholecalciferol  1,000 Units Oral Daily  .  Darunavir-Cobicisctat-Emtricitabine-Tenofovir Alafenamide  1 tablet Oral Q breakfast  . enoxaparin (LOVENOX) injection  40 mg Subcutaneous Q24H  . insulin aspart  0-6 Units Subcutaneous TID WC  . ipratropium  2 puff Inhalation Q6H  . methadone  90 mg Oral Daily  . methylPREDNISolone (SOLU-MEDROL) injection  40 mg Intravenous BID  . sulfamethoxazole-trimethoprim  1 tablet Oral Daily  . zinc sulfate  220 mg Oral Daily   Continuous Infusions: . remdesivir 100 mg in NS 100 mL 100 mg (03/17/20 1028)     LOS: 3  days        Shakeda Pearse Gerome Apley, MD

## 2020-03-17 NOTE — Plan of Care (Signed)
  Problem: Education: Goal: Knowledge of risk factors and measures for prevention of condition will improve Outcome: Progressing   Problem: Coping: Goal: Psychosocial and spiritual needs will be supported Outcome: Progressing   Problem: Respiratory: Goal: Will maintain a patent airway Outcome: Progressing Goal: Complications related to the disease process, condition or treatment will be avoided or minimized Outcome: Progressing   

## 2020-03-17 NOTE — Care Management Important Message (Signed)
Important Message  Patient Details IM Letter given to the Patient Name: Ruth Stephenson MRN: [id number] Date of Birth: 01-Apr-1951   Medicare Important Message Given:  Yes     Kerin Salen 03/17/2020, 10:56 AM

## 2020-03-18 DIAGNOSIS — R05 Cough: Secondary | ICD-10-CM

## 2020-03-18 DIAGNOSIS — F1129 Opioid dependence with unspecified opioid-induced disorder: Secondary | ICD-10-CM

## 2020-03-18 LAB — COMPREHENSIVE METABOLIC PANEL
ALT: 25 U/L (ref 0–44)
AST: 33 U/L (ref 15–41)
Albumin: 3.2 g/dL — ABNORMAL LOW (ref 3.5–5.0)
Alkaline Phosphatase: 76 U/L (ref 38–126)
Anion gap: 13 (ref 5–15)
BUN: 23 mg/dL (ref 8–23)
CO2: 25 mmol/L (ref 22–32)
Calcium: 10 mg/dL (ref 8.9–10.3)
Chloride: 96 mmol/L — ABNORMAL LOW (ref 98–111)
Creatinine, Ser: 0.82 mg/dL (ref 0.44–1.00)
GFR calc Af Amer: >60 mL/min (ref >60)
GFR calc non Af Amer: >60 mL/min (ref >60)
Glucose, Bld: 241 mg/dL — ABNORMAL HIGH (ref 70–99)
Potassium: 4.8 mmol/L (ref 3.5–5.1)
Sodium: 134 mmol/L — ABNORMAL LOW (ref 135–145)
Total Bilirubin: 0.7 mg/dL (ref 0.3–1.2)
Total Protein: 8.1 g/dL (ref 6.5–8.1)

## 2020-03-18 LAB — D-DIMER, QUANTITATIVE: D-Dimer, Quant: 0.45 ug/mL-FEU (ref 0.00–0.50)

## 2020-03-18 LAB — GLUCOSE, CAPILLARY
Glucose-Capillary: 144 mg/dL — ABNORMAL HIGH (ref 70–99)
Glucose-Capillary: 179 mg/dL — ABNORMAL HIGH (ref 70–99)

## 2020-03-18 LAB — FERRITIN: Ferritin: 267 ng/mL (ref 11–307)

## 2020-03-18 LAB — C-REACTIVE PROTEIN: CRP: 0.6 mg/dL (ref <1.0)

## 2020-03-18 MED ORDER — POLYVINYL ALCOHOL 1.4 % OP SOLN: 1.0000 [drp] | OPHTHALMIC | 0 refills | Status: DC | PRN

## 2020-03-18 MED ORDER — ALBUTEROL SULFATE HFA 108 (90 BASE) MCG/ACT IN AERS: 2.0000 | INHALATION_SPRAY | RESPIRATORY_TRACT | 0 refills | Status: DC | PRN

## 2020-03-18 MED ORDER — POLYVINYL ALCOHOL 1.4 % OP SOLN
1.0000 [drp] | OPHTHALMIC | Status: DC | PRN
  Filled 2020-03-18: qty 15

## 2020-03-18 MED ORDER — GUAIFENESIN-DM 100-10 MG/5ML PO SYRP: 5.0000 mL | ORAL_SOLUTION | Freq: Four times a day (QID) | ORAL | 0 refills | Status: DC | PRN

## 2020-03-18 NOTE — Plan of Care (Signed)
Patient Discharge Problem: Education: Goal: Knowledge of risk factors and measures for prevention of condition will improve Outcome: Adequate for Discharge   Problem: Coping: Goal: Psychosocial and spiritual needs will be supported Outcome: Adequate for Discharge   Problem: Respiratory: Goal: Will maintain a patent airway Outcome: Adequate for Discharge Goal: Complications related to the disease process, condition or treatment will be avoided or minimized Outcome: Adequate for Discharge

## 2020-03-18 NOTE — Discharge Summary (Signed)
Physician Discharge Summary  CABELLA KIMM PJK:[id number] DOB: Feb 19, 1951 DOA: 03/14/2020  PCP: Patient, No Pcp Per  Admit date: 03/14/2020 Discharge date: 03/18/2020  Admitted From: Home  Disposition:  Home   Recommendations for Outpatient Follow-up and new medication changes:  1. Follow up with primary care in 2 weeks. 2. Continue self quarantine for 2 weeks, use a mask in public and maintain physical distancing.  3. Recommended to take COVID 19 vaccine after recovering.  4. Continue antitussive agents and albuterol as needed.   Home Health: no   Equipment/Devices: no    Discharge Condition: stable  CODE STATUS: full  Diet recommendation: heart healthy   Brief/Interim Summary: Patient admitted to the hospital with working diagnosis of acute hypoxic respiratory failure due to SARS COVID-19 viral pneumonia.  69 year old female with past medical history of HIV and hepatitis C who reports not feeling well for about 10 days.  She had positive exposure to COVID-19.  Not vaccinated for COVID-19.  She had persistent cough and dyspnea that prompted her to come to the hospital.  On her initial physical examination her oxygenation was 87% on room air.  Her lungs had scattered wheezing and rhonchi, heart S1-S2, present rhythmic, soft abdomen, no lower extremity edema. Sodium 139, potassium 5.2, chloride 98, bicarb 29, glucose 96, BUN 24, creatinine 0.95, white count 3.8, hemoglobin 16.4, hematocrit 50.7, platelets 150.  SARS COVID-19 positive. EKG 74 bpm, normal axis, normal intervals, sinus rhythm, no ST segment or T wave changes, positive LVH. Her chest radiograph had bilateral lower lobes interstitial infiltrates peripherally.  Patient has been placed on remdesivir and systemic steroids with good toleration  1.  Acute hypoxic respiratory failure due to SARS COVID-19 viral pneumonia. Patient was admitted to the telemetry ward, she received medical therapy with remdesivir and systemic  corticosteroids.  Supplemental oxygen per nasal cannula, bronchodilators, antitussive agents and airway clearing techniques with incentive spirometer and flutter valve.  Her symptoms, oxygenation and inflammatory markers improved.  COVID-19 Labs  Recent Labs    03/16/20 0432 03/16/20 0806 03/17/20 0521 03/18/20 0530  DDIMER 9.40*  --  0.48 0.45  FERRITIN  --  264 245 267  CRP  --  0.5 0.5 0.6    Lab Results  Component Value Date   SARSCOV2NAA POSITIVE (A) 03/14/2020   Her oxygen saturation at discharge is 97% on room air.  Patient had transient leukopenia and thrombocytopenia, related to viral infection.   2.  Left forearm phlebitis.  It resolved at discharge.  3.  HIV.  Continue antiretroviral therapy, prophylactic Bactrim.  4.  Chronic pain syndrome.  Continue methadone.  5.  Steroid-induced hyperglycemia/prediabetes, hemoglobin A1c 5.8.  Patient received insulin therapy during her hospitalization.  At discharge steroids will be discontinued, plan to follow-up as an outpatient.  6. Transient hyperkalemia. Resolved at discharge, renal function normal.   7. Hepatitis C sp treatment/ Possible cirrhosis. Follow as outpatient.    Discharge Diagnoses:  Principal Problem:   Pneumonia due to COVID-19 virus Active Problems:   Human immunodeficiency virus (HIV) disease ([place])   Hepatitis C - treated   History of colon cancer, stage II   Cough   Opioid dependence ([place])   Acute hypoxemic respiratory failure ([facility])   SOB (shortness of breath)    Discharge Instructions   Allergies as of 03/18/2020   No Known Allergies     Medication List    TAKE these medications   acetaminophen 500 MG tablet Commonly known as: TYLENOL Take 1  tablet (500 mg total) by mouth every 6 (six) hours as needed for mild pain, moderate pain or headache.   albuterol 108 (90 Base) MCG/ACT inhaler Commonly known as: VENTOLIN HFA Inhale 2 puffs into the lungs every 2 (two) hours as needed for  wheezing or shortness of breath.   gabapentin 300 MG capsule Commonly known as: NEURONTIN Take 1 capsule (300 mg total) by mouth 3 (three) times daily. Start out taking 1 pill before bedtime-  After a week add another pill at dinner time (pm) and at bedtime.  After another week you may add a 3rd pill in the morning for a total of 3 pills a day.   guaiFENesin-dextromethorphan 100-10 MG/5ML syrup Commonly known as: ROBITUSSIN DM Take 5 mLs by mouth every 6 (six) hours as needed for cough.   methadone 10 MG/ML solution Commonly known as: DOLOPHINE Take 90 mg by mouth daily.   polyvinyl alcohol 1.4 % ophthalmic solution Commonly known as: LIQUIFILM TEARS Place 1 drop into both eyes as needed for dry eyes.   sulfamethoxazole-trimethoprim 400-80 MG tablet Commonly known as: BACTRIM TAKE 1 TABLET BY MOUTH ONCE A DAY   Symtuza 800-150-200-10 MG Tabs Generic drug: Darunavir-Cobicisctat-Emtricitabine-Tenofovir Alafenamide TAKE 1 TABLET BY MOUTH DAILY WITH BREAKFAST.       No Known Allergies      Procedures/Studies: DG Chest Port 1 View  Result Date: 03/14/2020 CLINICAL DATA:  Cough.  COVID-19. EXAM: PORTABLE CHEST 1 VIEW COMPARISON:  September 09, 2018 FINDINGS: There is infiltrate in the periphery of the right base. There is infiltrate in the periphery of the left base. The heart, hila, mediastinum, lungs, and pleura are otherwise unremarkable. No pneumothorax. IMPRESSION: Peripheral bibasilar infiltrates, left greater than right, consistent with the patient's history of COVID-19. Electronically Signed   By: Dorise Bullion III M.D   On: 03/14/2020 18:37        Subjective: Patient is feeling better, dyspnea has improved, patient has been out of bed to chair.   Discharge Exam: Vitals:   03/17/20 2005 03/18/20 0451  BP: 125/75 (!) 149/90  Pulse: 60 66  Resp: 18 18  Temp: 98.3 F (36.8 C) 98.1 F (36.7 C)  SpO2: 94% 97%   Vitals:   03/17/20 0535 03/17/20 1200 03/17/20 2005  03/18/20 0451  BP: 128/70 133/73 125/75 (!) 149/90  Pulse: 62 63 60 66  Resp: 20 18 18 18   Temp: 98.2 F (36.8 C) 98.7 F (37.1 C) 98.3 F (36.8 C) 98.1 F (36.7 C)  TempSrc: Oral Oral    SpO2: 97% 92% 94% 97%  Weight:      Height:        General: Not in pain or dyspnea.  Neurology: Awake and alert, non focal  E ENT: no pallor, no icterus, oral mucosa moist Cardiovascular: No JVD. S1-S2 present, rhythmic, no gallops, rubs, or murmurs. No lower extremity edema. Pulmonary: positive breath sounds bilaterally, no wheezing.  Gastrointestinal. Abdomen soft and non tender Skin. No rashes Musculoskeletal: no joint deformities   The results of significant diagnostics from this hospitalization (including imaging, microbiology, ancillary and laboratory) are listed below for reference.     Microbiology: Recent Results (from the past 240 hour(s))  SARS Coronavirus 2 by RT PCR (hospital order, performed in [facility] hospital lab) Nasopharyngeal Nasopharyngeal Swab     Status: Abnormal   Collection Time: 03/14/20  6:47 PM   Specimen: Nasopharyngeal Swab  Result Value Ref Range Status   SARS Coronavirus 2 POSITIVE (A) NEGATIVE  Final    Comment: RESULT CALLED TO, READ BACK BY AND VERIFIED WITH: GOODWIN,REBECCA,RN @ 2115 03/14/20 BY TURNER,S. (NOTE) SARS-CoV-2 target nucleic acids are DETECTED  SARS-CoV-2 RNA is generally detectable in upper respiratory specimens  during the acute phase of infection.  Positive results are indicative  of the presence of the identified virus, but do not rule out bacterial infection or co-infection with other pathogens not detected by the test.  Clinical correlation with patient history and  other diagnostic information is necessary to determine patient infection status.  The expected result is negative.  Fact Sheet for Patients:   StrictlyIdeas.no   Fact Sheet for Healthcare Providers:    BankingDealers.co.za    This test is not yet approved or cleared by the [place] FDA and  has been authorized for detection and/or diagnosis of SARS-CoV-2 by FDA under an Emergency Use Authorization (EUA).  This EUA will remain in effect (mea ning this test can be used) for the duration of  the COVID-19 declaration under Section 564(b)(1) of the Act, 21 U.S.C. section 360-bbb-3(b)(1), unless the authorization is terminated or revoked sooner.  Performed at [facility], [place] [address], [place], [place] [address]   Blood Culture (routine x 2)     Status: None (Preliminary result)   Collection Time: 03/14/20  6:49 PM   Specimen: BLOOD LEFT FOREARM  Result Value Ref Range Status   Specimen Description   Final    BLOOD LEFT FOREARM Performed at [facility] [address], [place], [place] [address]    Special Requests   Final    BOTTLES DRAWN AEROBIC AND ANAEROBIC Blood Culture adequate volume Performed at [place] [address], [place], [place] [address]    Culture   Final    NO GROWTH 2 DAYS Performed at [facility] [address], [place], [place] [address]    Report Status PENDING  Incomplete  Blood Culture (routine x 2)     Status: None (Preliminary result)   Collection Time: 03/14/20  7:29 PM   Specimen: BLOOD  Result Value Ref Range Status   Specimen Description   Final    BLOOD LEFT ARM Performed at [facility] [address] [place], [place] [address]    Special Requests   Final    BOTTLES DRAWN AEROBIC AND ANAEROBIC Blood Culture results may not be optimal due to an inadequate volume of blood received in culture bottles Performed at [facility] [address] [place], [place] [address]    Culture   Final    NO GROWTH 2 DAYS Performed at [facility] [address] [place],  [address]    Report Status PENDING   Incomplete     Labs: BNP (last 3 results) No results for input(s): BNP in the last 8760 hours. Basic Metabolic Panel: Recent Labs  Lab 03/14/20 1849 03/15/20 0833 03/17/20 0521 03/18/20 0530  NA 139 133* 133* 134*  K 5.2* 4.8 5.7* 4.8  CL 98 98 97* 96*  CO2 29 26 26 25   GLUCOSE 96 131* 223* 241*  BUN 24* 18 20 23   CREATININE 0.95 0.74 0.73 0.82  CALCIUM 9.8 9.3 10.1 10.0  MG  --  2.0 1.9  --   PHOS  --  2.7 3.2  --    Liver Function Tests: Recent Labs  Lab 03/14/20 1849 03/15/20 0833 03/17/20 0521 03/18/20 0530  AST 29 29 30  33  ALT 18 18 23 25   ALKPHOS 63 60 63 76  BILITOT 0.8 1.0 0.6 0.7  PROT 9.4* 9.1* 8.6* 8.1  ALBUMIN 3.6 3.5 3.4* 3.2*   No results for input(s): LIPASE, AMYLASE in the last 168 hours. No results for input(s): AMMONIA in the last 168 hours. CBC: Recent Labs  Lab 03/14/20 1849 03/15/20 0421 03/16/20 0432 03/17/20 0521  WBC 3.8* 2.6* 10.7* 7.6  NEUTROABS 1.9 1.7 8.4* 6.2  HGB 16.4* 15.8* 15.8* 15.7*  HCT 50.7* 49.5* 48.0* 48.4*  MCV 101.6* 102.7* 100.6* 103.2*  PLT 150 120* 171 132*   Cardiac Enzymes: No results for input(s): CKTOTAL, CKMB, CKMBINDEX, TROPONINI in the last 168 hours. BNP: Invalid input(s): POCBNP CBG: Recent Labs  Lab 03/17/20 0742 03/17/20 1156 03/17/20 1637 03/17/20 2002 03/18/20 0736  GLUCAP 102* 194* 175* 207* 144*   D-Dimer Recent Labs    03/17/20 0521 03/18/20 0530  DDIMER 0.48 0.45   Hgb A1c Recent Labs    03/16/20 0806  HGBA1C 5.8*   Lipid Profile No results for input(s): CHOL, HDL, LDLCALC, TRIG, CHOLHDL, LDLDIRECT in the last 72 hours. Thyroid function studies No results for input(s): TSH, T4TOTAL, T3FREE, THYROIDAB in the last 72 hours.  Invalid input(s): FREET3 Anemia work up Recent Labs    03/17/20 0521 03/18/20 0530  FERRITIN 245 267   Urinalysis    Component Value Date/Time   COLORURINE AMBER (A) 05/04/2017 1831   APPEARANCEUR HAZY (A) 05/04/2017 1831   LABSPEC 1.016  05/04/2017 1831   PHURINE 5.0 05/04/2017 1831   GLUCOSEU NEGATIVE 05/04/2017 1831   HGBUR SMALL (A) 05/04/2017 1831   BILIRUBINUR NEGATIVE 05/04/2017 1831   KETONESUR NEGATIVE 05/04/2017 1831   PROTEINUR 100 (A) 05/04/2017 1831   UROBILINOGEN 1.0 03/25/2015 2034   NITRITE NEGATIVE 05/04/2017 1831   LEUKOCYTESUR NEGATIVE 05/04/2017 1831   Sepsis Labs Invalid input(s): PROCALCITONIN,  WBC,  LACTICIDVEN Microbiology Recent Results (from the past 240 hour(s))  SARS Coronavirus 2 by RT PCR (hospital order, performed in [facility] hospital lab) Nasopharyngeal Nasopharyngeal Swab     Status: Abnormal   Collection Time: 03/14/20  6:47 PM   Specimen: Nasopharyngeal Swab  Result Value Ref Range Status   SARS Coronavirus 2 POSITIVE (A) NEGATIVE Final    Comment: RESULT CALLED TO, READ BACK BY AND VERIFIED WITH: GOODWIN,REBECCA,RN @ 2115 03/14/20 BY TURNER,S. (NOTE) SARS-CoV-2 target nucleic acids are DETECTED  SARS-CoV-2 RNA is generally detectable in upper respiratory specimens  during the acute phase of infection.  Positive results are indicative  of the presence of the identified virus, but do not rule out bacterial infection or co-infection with other pathogens not detected by the test.  Clinical correlation with patient history and  other diagnostic information is necessary to determine patient infection status.  The expected result is negative.  Fact Sheet for Patients:   StrictlyIdeas.no   Fact Sheet for Healthcare Providers:   BankingDealers.co.za    This test is not yet approved or cleared by the [place] FDA and  has been authorized for detection and/or diagnosis of SARS-CoV-2 by FDA under an Emergency Use Authorization (EUA).  This EUA will remain in effect (mea ning this test can be used) for the duration of  the COVID-19 declaration under Section 564(b)(1) of the Act, 21 U.S.C. section 360-bbb-3(b)(1), unless the  authorization is terminated or revoked sooner.  Performed at [facility], [place] [address] [place], [place] [address]   Blood Culture (routine x 2)  Status: None (Preliminary result)   Collection Time: 03/14/20  6:49 PM   Specimen: BLOOD LEFT FOREARM  Result Value Ref Range Status   Specimen Description   Final    BLOOD LEFT FOREARM Performed at [facility] [address], [place], [place] [address]    Special Requests   Final    BOTTLES DRAWN AEROBIC AND ANAEROBIC Blood Culture adequate volume Performed at [place] [address], [place], [place] [address]    Culture   Final    NO GROWTH 2 DAYS Performed at [facility] [place] [address], [place], [place] [address]    Report Status PENDING  Incomplete  Blood Culture (routine x 2)     Status: None (Preliminary result)   Collection Time: 03/14/20  7:29 PM   Specimen: BLOOD  Result Value Ref Range Status   Specimen Description   Final    BLOOD LEFT ARM Performed at [facility] [address] [place], [place] [address]    Special Requests   Final    BOTTLES DRAWN AEROBIC AND ANAEROBIC Blood Culture results may not be optimal due to an inadequate volume of blood received in culture bottles Performed at [facility] [address], [place], [place] [address]    Culture   Final    NO GROWTH 2 DAYS Performed at [facility] [place] [facility] [address], [place],  [address]    Report Status PENDING  Incomplete     Time coordinating discharge: 45 minutes  SIGNED:   Tawni Millers, MD  [facility] 03/18/2020, 8:54 AM

## 2020-03-18 NOTE — Discharge Instructions (Signed)
10 Things You Can Do to Manage Your COVID-19 Symptoms at Home If you have possible or confirmed COVID-19: 1. Stay home from work and school. And stay away from other public places. If you must go out, avoid using any kind of public transportation, ridesharing, or taxis. 2. Monitor your symptoms carefully. If your symptoms get worse, call your healthcare provider immediately. 3. Get rest and stay hydrated. 4. If you have a medical appointment, call the healthcare provider ahead of time and tell them that you have or may have COVID-19. 5. For medical emergencies, call 911 and notify the dispatch personnel that you have or may have COVID-19. 6. Cover your cough and sneezes with a tissue or use the inside of your elbow. 7. Wash your hands often with soap and water for at least 20 seconds or clean your hands with an alcohol-based hand sanitizer that contains at least 60% alcohol. 8. As much as possible, stay in a specific room and away from other people in your home. Also, you should use a separate bathroom, if available. If you need to be around other people in or outside of the home, wear a mask. 9. Avoid sharing personal items with other people in your household, like dishes, towels, and bedding. 10. Clean all surfaces that are touched often, like counters, tabletops, and doorknobs. Use household cleaning sprays or wipes according to the label instructions. [email or web address].com 12/25/2018 This information is not intended to replace advice given to you by your health care provider. Make sure you discuss any questions you have with your health care provider. Document Revised: 05/29/2019 Document Reviewed: 05/29/2019 Elsevier Patient Education  [place] Can Do to Manage Your COVID-19 Symptoms at Home If you have possible or confirmed COVID-19: 11. Stay home from work and school. And stay away from other public places. If you must go out, avoid using any kind of public  transportation, ridesharing, or taxis. 12. Monitor your symptoms carefully. If your symptoms get worse, call your healthcare provider immediately. 13. Get rest and stay hydrated. 14. If you have a medical appointment, call the healthcare provider ahead of time and tell them that you have or may have COVID-19. 15. For medical emergencies, call 911 and notify the dispatch personnel that you have or may have COVID-19. 16. Cover your cough and sneezes with a tissue or use the inside of your elbow. 30. Wash your hands often with soap and water for at least 20 seconds or clean your hands with an alcohol-based hand sanitizer that contains at least 60% alcohol. 18. As much as possible, stay in a specific room and away from other people in your home. Also, you should use a separate bathroom, if available. If you need to be around other people in or outside of the home, wear a mask. 19. Avoid sharing personal items with other people in your household, like dishes, towels, and bedding. 20. Clean all surfaces that are touched often, like counters, tabletops, and doorknobs. Use household cleaning sprays or wipes according to the label instructions. [email or web address].com 12/25/2018 This information is not intended to replace advice given to you by your health care provider. Make sure you discuss any questions you have with your health care provider. Document Revised: 05/29/2019 Document Reviewed: 05/29/2019 Elsevier Patient Education  [place].

## 2020-03-18 NOTE — Plan of Care (Signed)
Patient is ready for discharge. Transportation has been arranged and is outside the main hospital entrance. AVS has been discussed in great detail with the patient and all questions answered to include medication questions. Patient provided with education on new meds and advised on what to expect over the next 14 days while she remains quarantined. All IV's removed personal belongings returned and patient transported by Probation officer to her car where her significant others is waiting to carry her home. Telemetry Box removed, cleaned, and turned in. Patient Discharged.  Problem: Education: Goal: Knowledge of risk factors and measures for prevention of condition will improve Outcome: Adequate for Discharge   Problem: Coping: Goal: Psychosocial and spiritual needs will be supported Outcome: Adequate for Discharge   Problem: Respiratory: Goal: Will maintain a patent airway Outcome: Adequate for Discharge Goal: Complications related to the disease process, condition or treatment will be avoided or minimized Outcome: Adequate for Discharge

## 2020-03-20 LAB — CULTURE, BLOOD (ROUTINE X 2)
Culture: NO GROWTH
Culture: NO GROWTH
Special Requests: ADEQUATE

## 2020-03-22 DIAGNOSIS — F1120 Opioid dependence, uncomplicated: Secondary | ICD-10-CM | POA: Diagnosis not present

## 2020-03-22 MED FILL — SYMTUZA 800-150-200-10 MG T: 800-150-200 | 30 days supply | Qty: 30 | Fill #2

## 2020-03-22 MED FILL — SULFAMETHOXAZOLE-TMP SS TAB: 400-80 | 30 days supply | Qty: 30 | Fill #2

## 2020-03-29 ENCOUNTER — Telehealth: Payer: Self-pay

## 2020-03-29 DIAGNOSIS — F1120 Opioid dependence, uncomplicated: Secondary | ICD-10-CM | POA: Diagnosis not present

## 2020-03-29 NOTE — Telephone Encounter (Signed)
Patient recently released from being hospitalized for COVID pneumonia. Patient states that she has completed her 2 week quarantine that was instructed by discharging physician. Requesting a note stating that she is cleared and able to leave quarantine. Forwarding to her provider to clarify quarantine requirements (2 weeks vs 21 days) and for note.   Lia Vigilante Lorita Officer, RN

## 2020-03-31 NOTE — Telephone Encounter (Signed)
If she was in hospital, required O2, I would favor 21 days of isolation.

## 2020-04-05 DIAGNOSIS — F1120 Opioid dependence, uncomplicated: Secondary | ICD-10-CM | POA: Diagnosis not present

## 2020-04-08 ENCOUNTER — Other Ambulatory Visit: Payer: Medicare HMO

## 2020-04-12 DIAGNOSIS — F1120 Opioid dependence, uncomplicated: Secondary | ICD-10-CM | POA: Diagnosis not present

## 2020-04-15 ENCOUNTER — Encounter: Payer: Self-pay | Admitting: Infectious Diseases

## 2020-04-15 ENCOUNTER — Other Ambulatory Visit: Payer: Self-pay

## 2020-04-15 ENCOUNTER — Telehealth (INDEPENDENT_AMBULATORY_CARE_PROVIDER_SITE_OTHER): Payer: Medicare HMO | Admitting: Infectious Diseases

## 2020-04-15 DIAGNOSIS — B182 Chronic viral hepatitis C: Secondary | ICD-10-CM

## 2020-04-15 DIAGNOSIS — K760 Fatty (change of) liver, not elsewhere classified: Secondary | ICD-10-CM | POA: Diagnosis not present

## 2020-04-15 DIAGNOSIS — B20 Human immunodeficiency virus [HIV] disease: Secondary | ICD-10-CM

## 2020-04-15 DIAGNOSIS — Z113 Encounter for screening for infections with a predominantly sexual mode of transmission: Secondary | ICD-10-CM | POA: Diagnosis not present

## 2020-04-15 DIAGNOSIS — Z79899 Other long term (current) drug therapy: Secondary | ICD-10-CM | POA: Diagnosis not present

## 2020-04-15 DIAGNOSIS — C182 Malignant neoplasm of ascending colon: Secondary | ICD-10-CM

## 2020-04-15 NOTE — Assessment & Plan Note (Signed)
Will repeat her colonoscopy. Her f/u was missed.

## 2020-04-15 NOTE — Assessment & Plan Note (Signed)
Will repeat her MRI of liver (prev cirrhosis)

## 2020-04-15 NOTE — Progress Notes (Signed)
° °  Subjective:    Patient ID: Ruth Stephenson, female    DOB: 1950/11/23, 70 y.o.   MRN: [id number]  HPI 69yo F with HIV+ since 1989, on genvoya --> Symtuza. She also has a hx of Hep C treated ([facility]) 2015.She had a liver u/s in 2014 that showed that she was cirrhotic. In 08-2012 she had surgery for colon cancer (II).  She had CT abd 04-2017:Cirrhotic morphology of the liver. Hyperdense focus within the anterior left hepatic lobe ; when clinically feasible, evaluation with liver MRI is suggested, preferably as an outpatient when the patient is able to breath hold. Last Onc f/u was 01-2018 and she was to have f/u colonoscopy as well as MRI of liver to f/u prev lesion.   She was in hospital 03-14-20 to 9-23 for COVID. She received steroids, remdesivir, O2 after O2 on adm was 87%.  She has questions about when she can resume her normal activities. No DOE, "it's like i've never been sick".  Her smell and taste have returned. She feels much better.  She wants to know if and where she can get the COVID vaccine.   She has had no difficulty with her symtuza.   HIV 1 RNA Quant (copies/mL)  Date Value  12/30/2019 <20 NOT DETECTED  07/10/2019 281 (H)  09/09/2018 <20 NOT DETECTED   CD4 T Cell Abs (/uL)  Date Value  12/30/2019 221 (L)  01/02/2018 240 (L)  07/11/2017 210 (L)    Review of Systems  Constitutional: Negative for chills and fever.  Respiratory: Negative for cough and shortness of breath.   Gastrointestinal: Negative for diarrhea and nausea.  Genitourinary: Negative for difficulty urinating.  Please see HPI. All other systems reviewed and negative.      Objective:   Physical Exam  1. Due to the national emergency this service was provided using telemedicine. phone.  2. Consent from the patient for the telehealth visit and that you identified patient- name DOB.   3. Your locations, Pt and Provider- home, [facility]  4. Chief complaint for visit- hospital f/u.   5. Document  anyone else on the call  6. If the visit was a phone call, that you include the time you spent on the call.         Assessment & Plan:

## 2020-04-15 NOTE — Assessment & Plan Note (Signed)
Encouraged her to get her covid vacination.  Will see her in 1 month and get her labs then.  She appears to be doing well.

## 2020-04-19 DIAGNOSIS — F1120 Opioid dependence, uncomplicated: Secondary | ICD-10-CM | POA: Diagnosis not present

## 2020-04-19 MED FILL — SYMTUZA 800-150-200-10 MG T: 800-150-200 | 30 days supply | Qty: 30 | Fill #3

## 2020-04-19 MED FILL — SULFAMETHOXAZOLE-TMP SS TAB: 400-80 | 30 days supply | Qty: 30 | Fill #3

## 2020-04-26 DIAGNOSIS — F1120 Opioid dependence, uncomplicated: Secondary | ICD-10-CM | POA: Diagnosis not present

## 2020-04-29 ENCOUNTER — Encounter: Payer: Medicare HMO | Admitting: Infectious Diseases

## 2020-05-03 DIAGNOSIS — F1120 Opioid dependence, uncomplicated: Secondary | ICD-10-CM | POA: Diagnosis not present

## 2020-05-04 ENCOUNTER — Ambulatory Visit: Payer: Medicare HMO | Admitting: Infectious Diseases

## 2020-05-10 DIAGNOSIS — F1120 Opioid dependence, uncomplicated: Secondary | ICD-10-CM | POA: Diagnosis not present

## 2020-05-11 ENCOUNTER — Other Ambulatory Visit: Payer: Medicare HMO

## 2020-05-11 ENCOUNTER — Telehealth: Payer: Self-pay

## 2020-05-11 NOTE — Telephone Encounter (Signed)
Patient called [facility] to confirm appointment with [facility]. Patient provided with appointment information and with contact information.  Eugenia Mcalpine

## 2020-05-14 MED FILL — SYMTUZA 800-150-200-10 MG T: 800-150-200 | 30 days supply | Qty: 30 | Fill #4

## 2020-05-14 MED FILL — SULFAMETHOXAZOLE-TMP SS TAB: 400-80 | 30 days supply | Qty: 30 | Fill #4

## 2020-05-17 DIAGNOSIS — F1120 Opioid dependence, uncomplicated: Secondary | ICD-10-CM | POA: Diagnosis not present

## 2020-05-24 DIAGNOSIS — F1120 Opioid dependence, uncomplicated: Secondary | ICD-10-CM | POA: Diagnosis not present

## 2020-05-30 ENCOUNTER — Other Ambulatory Visit: Payer: Medicare HMO

## 2020-05-31 DIAGNOSIS — F1120 Opioid dependence, uncomplicated: Secondary | ICD-10-CM | POA: Diagnosis not present

## 2020-06-02 ENCOUNTER — Ambulatory Visit: Payer: Medicare HMO

## 2020-06-03 ENCOUNTER — Ambulatory Visit (INDEPENDENT_AMBULATORY_CARE_PROVIDER_SITE_OTHER): Payer: Medicare HMO | Admitting: Nurse Practitioner

## 2020-06-03 VITALS — BP 98/68 | HR 64 | Temp 97.7°F | Ht 63.0 in | Wt 201.8 lb

## 2020-06-03 DIAGNOSIS — R601 Generalized edema: Secondary | ICD-10-CM | POA: Diagnosis not present

## 2020-06-03 DIAGNOSIS — R0602 Shortness of breath: Secondary | ICD-10-CM | POA: Diagnosis not present

## 2020-06-03 DIAGNOSIS — Z8616 Personal history of COVID-19: Secondary | ICD-10-CM

## 2020-06-03 HISTORY — DX: Personal history of COVID-19: Z86.16

## 2020-06-03 MED ORDER — FUROSEMIDE 20 MG PO TABS: 10.0000 mg | ORAL_TABLET | Freq: Every day | ORAL | 0 refills | Status: DC

## 2020-06-03 NOTE — Progress Notes (Signed)
@Patient  ID: Ruth Stephenson, female    DOB: 1951/03/16, 69 y.o.   MRN: [id number]  Chief Complaint  Patient presents with  . New Patient (Initial Visit)    COVID 02/2020 Sx: back pain, body swelling.     Referring provider: No ref. provider found   69 year old female with history of hepatitis C, colon cancer, GERD, neuropathy, HIV, pancreatitis.   HPI  Patient presents today for post COVID care clinic visit.  She was diagnosed with Covid in September 2021.  She states that since this time she has been having ongoing shortness of breath and generalized edema.  She states that she feels like she is swollen all over her body.  She states that before she had Covid she weighed approximately 169 pounds.  During her office visit today she weighs 201 pounds.  Patient's hands and legs do appear edematous.  Patient will need repeat imaging.  We will check lab work including BNP.  We discussed that we will start patient on low dose of Lasix for short-term (3 days).  Patient will need referral to cardiology for ongoing edema and will also need an appointment set up to establish care with PCP. Denies f/c/s, n/v/d, hemoptysis, PND, chest pain.       No Known Allergies  Immunization History  Administered Date(s) Administered  . Influenza,inj,Quad PF,6+ Mos 07/14/2013, 05/27/2014, 08/30/2015, 04/10/2016  . Pneumococcal Conjugate-13 08/30/2015  . Pneumococcal Polysaccharide-23 07/14/2013    Past Medical History:  Diagnosis Date  . Cancer ([place])   . Hepatitis C   . HIV (human immunodeficiency virus infection) ([place])     Tobacco History: Social History   Tobacco Use  Smoking Status Current Some Day Smoker  . Packs/day: 0.50  . Years: 35.00  . Pack years: 17.50  . Types: Cigarettes  Smokeless Tobacco Never Used  Tobacco Comment   trying to cut back; 4-5 cigarettes per day    Ready to quit: Not Answered Counseling given: Not Answered Comment: trying to cut back; 4-5 cigarettes per day     Outpatient Encounter Medications as of 06/03/2020  Medication Sig  . methadone (DOLOPHINE) 10 MG/ML solution Take 90 mg by mouth daily.   Marland Kitchen sulfamethoxazole-trimethoprim (BACTRIM) 400-80 MG tablet TAKE 1 TABLET BY MOUTH ONCE A DAY (Patient taking differently: Take 1 tablet by mouth daily.)  . SYMTUZA 800-150-200-10 MG TABS TAKE 1 TABLET BY MOUTH DAILY WITH BREAKFAST.  Marland Kitchen acetaminophen (TYLENOL) 500 MG tablet Take 1 tablet (500 mg total) by mouth every 6 (six) hours as needed for mild pain, moderate pain or headache. (Patient not taking: No sig reported)  . albuterol (VENTOLIN HFA) 108 (90 Base) MCG/ACT inhaler Inhale 2 puffs into the lungs every 2 (two) hours as needed for wheezing or shortness of breath. (Patient not taking: No sig reported)  . furosemide (LASIX) 20 MG tablet Take 0.5 tablets (10 mg total) by mouth daily for 3 days.  Marland Kitchen gabapentin (NEURONTIN) 300 MG capsule Take 1 capsule (300 mg total) by mouth 3 (three) times daily. Start out taking 1 pill before bedtime-  After a week add another pill at dinner time (pm) and at bedtime.  After another week you may add a 3rd pill in the morning for a total of 3 pills a day. (Patient not taking: No sig reported)  . guaiFENesin-dextromethorphan (ROBITUSSIN DM) 100-10 MG/5ML syrup Take 5 mLs by mouth every 6 (six) hours as needed for cough. (Patient not taking: Reported on 06/03/2020)  . polyvinyl alcohol (LIQUIFILM  TEARS) 1.4 % ophthalmic solution Place 1 drop into both eyes as needed for dry eyes. (Patient not taking: Reported on 06/03/2020)   No facility-administered encounter medications on file as of 06/03/2020.     Review of Systems  Review of Systems  Constitutional: Positive for unexpected weight change. Negative for fatigue and fever.  HENT: Negative.   Respiratory: Positive for shortness of breath. Negative for cough.   Cardiovascular: Positive for leg swelling. Negative for chest pain and palpitations.  Gastrointestinal: Negative.    Allergic/Immunologic: Negative.   Neurological: Negative.   Psychiatric/Behavioral: Negative.        Physical Exam  BP 98/68 (BP Location: Left Arm)   Pulse 64   Temp 97.7 F (36.5 C)   Ht 5\' 3"  (1.6 m)   Wt 201 lb 12 oz (91.5 kg)   SpO2 98%   BMI 35.74 kg/m   Wt Readings from Last 5 Encounters:  06/03/20 201 lb 12 oz (91.5 kg)  03/15/20 180 lb 1.9 oz (81.7 kg)  07/10/19 158 lb (71.7 kg)  01/28/18 130 lb 1.6 oz (59 kg)  01/23/18 136 lb (61.7 kg)     Physical Exam Vitals and nursing note reviewed.  Constitutional:      General: She is not in acute distress.    Appearance: She is well-developed and well-nourished.  Cardiovascular:     Rate and Rhythm: Normal rate and regular rhythm.  Pulmonary:     Effort: Pulmonary effort is normal.     Breath sounds: Normal breath sounds.  Musculoskeletal:        General: Swelling (bilateral hands) present.     Right lower leg: Edema present.     Left lower leg: Edema present.  Neurological:     Mental Status: She is alert and oriented to person, place, and time.  Psychiatric:        Mood and Affect: Mood and affect and mood normal.        Behavior: Behavior normal.        Assessment & Plan:   History of COVID-19 Shortness of breath:  Stay active  Deep breathing exercises  Will check follow up chest xray   Generalized edema:  Give give low dose lasix x3 days  Low sodium diet  Elevate legs  Stay active  Will check labs  Will place referral to cardiology  Will set up appointment for patient to establish care with new PCP  Follow up:  As needed     Patient Instructions  History of covid pneumonia Shortness of breath:  Stay active  Deep breathing exercises  Will check follow up chest xray   Generalized edema:  Give give low dose lasix x3 days  Low sodium diet  Elevate legs  Stay active  Will check labs  Will place referral to cardiology  Will set up appointment for patient  to establish care with new PCP  Follow up:  As needed   Edema  Edema is when you have too much fluid in your body or under your skin. Edema may make your legs, feet, and ankles swell up. Swelling is also common in looser tissues, like around your eyes. This is a common condition. It gets more common as you get older. There are many possible causes of edema. Eating too much salt (sodium) and being on your feet or sitting for a long time can cause edema in your legs, feet, and ankles. Hot weather may make edema worse. Edema is usually painless. Your  skin may look swollen or shiny. Follow these instructions at home:  Keep the swollen body part raised (elevated) above the level of your heart when you are sitting or lying down.  Do not sit still or stand for a long time.  Do not wear tight clothes. Do not wear garters on your upper legs.  Exercise your legs. This can help the swelling go down.  Wear elastic bandages or support stockings as told by your doctor.  Eat a low-salt (low-sodium) diet to reduce fluid as told by your doctor.  Depending on the cause of your swelling, you may need to limit how much fluid you drink (fluid restriction).  Take over-the-counter and prescription medicines only as told by your doctor. Contact a doctor if:  Treatment is not working.  You have heart, liver, or kidney disease and have symptoms of edema.  You have sudden and unexplained weight gain. Get help right away if:  You have shortness of breath or chest pain.  You cannot breathe when you lie down.  You have pain, redness, or warmth in the swollen areas.  You have heart, liver, or kidney disease and get edema all of a sudden.  You have a fever and your symptoms get worse all of a sudden. Summary  Edema is when you have too much fluid in your body or under your skin.  Edema may make your legs, feet, and ankles swell up. Swelling is also common in looser tissues, like around your  eyes.  Raise (elevate) the swollen body part above the level of your heart when you are sitting or lying down.  Follow your doctor's instructions about diet and how much fluid you can drink (fluid restriction). This information is not intended to replace advice given to you by your health care provider. Make sure you discuss any questions you have with your health care provider. Document Revised: 06/15/2017 Document Reviewed: 06/30/2016 Elsevier Patient Education  2020 [place], [place] 06/03/2020

## 2020-06-03 NOTE — Assessment & Plan Note (Signed)
Shortness of breath:  Stay active  Deep breathing exercises  Will check follow up chest xray   Generalized edema:  Give give low dose lasix x3 days  Low sodium diet  Elevate legs  Stay active  Will check labs  Will place referral to cardiology  Will set up appointment for patient to establish care with new PCP  Follow up:  As needed

## 2020-06-03 NOTE — Patient Instructions (Addendum)
History of covid pneumonia Shortness of breath:  Stay active  Deep breathing exercises  Will check follow up chest xray   Generalized edema:  Give give low dose lasix x3 days  Low sodium diet  Elevate legs  Stay active  Will check labs  Will place referral to cardiology  Will set up appointment for patient to establish care with new PCP  Follow up:  As needed   Edema  Edema is when you have too much fluid in your body or under your skin. Edema may make your legs, feet, and ankles swell up. Swelling is also common in looser tissues, like around your eyes. This is a common condition. It gets more common as you get older. There are many possible causes of edema. Eating too much salt (sodium) and being on your feet or sitting for a long time can cause edema in your legs, feet, and ankles. Hot weather may make edema worse. Edema is usually painless. Your skin may look swollen or shiny. Follow these instructions at home:  Keep the swollen body part raised (elevated) above the level of your heart when you are sitting or lying down.  Do not sit still or stand for a long time.  Do not wear tight clothes. Do not wear garters on your upper legs.  Exercise your legs. This can help the swelling go down.  Wear elastic bandages or support stockings as told by your doctor.  Eat a low-salt (low-sodium) diet to reduce fluid as told by your doctor.  Depending on the cause of your swelling, you may need to limit how much fluid you drink (fluid restriction).  Take over-the-counter and prescription medicines only as told by your doctor. Contact a doctor if:  Treatment is not working.  You have heart, liver, or kidney disease and have symptoms of edema.  You have sudden and unexplained weight gain. Get help right away if:  You have shortness of breath or chest pain.  You cannot breathe when you lie down.  You have pain, redness, or warmth in the swollen areas.  You have  heart, liver, or kidney disease and get edema all of a sudden.  You have a fever and your symptoms get worse all of a sudden. Summary  Edema is when you have too much fluid in your body or under your skin.  Edema may make your legs, feet, and ankles swell up. Swelling is also common in looser tissues, like around your eyes.  Raise (elevate) the swollen body part above the level of your heart when you are sitting or lying down.  Follow your doctor's instructions about diet and how much fluid you can drink (fluid restriction). This information is not intended to replace advice given to you by your health care provider. Make sure you discuss any questions you have with your health care provider. Document Revised: 06/15/2017 Document Reviewed: 06/30/2016 Elsevier Patient Education  2020 [employer].

## 2020-06-04 ENCOUNTER — Ambulatory Visit
Admission: RE | Admit: 2020-06-04 | Discharge: 2020-06-04 | Disposition: A | Discharge status: Home / Self Care | Payer: Medicare HMO | Source: Ambulatory Visit | Attending: Nurse Practitioner | Admitting: Nurse Practitioner

## 2020-06-04 DIAGNOSIS — R0602 Shortness of breath: Secondary | ICD-10-CM | POA: Diagnosis not present

## 2020-06-04 DIAGNOSIS — Z8616 Personal history of COVID-19: Secondary | ICD-10-CM | POA: Diagnosis not present

## 2020-06-04 DIAGNOSIS — R601 Generalized edema: Secondary | ICD-10-CM | POA: Diagnosis not present

## 2020-06-05 LAB — COMPREHENSIVE METABOLIC PANEL
ALT: 10 IU/L (ref 0–32)
AST: 20 IU/L (ref 0–40)
Albumin/Globulin Ratio: 1.3 (ref 1.2–2.2)
Albumin: 4 g/dL (ref 3.8–4.8)
Alkaline Phosphatase: 74 IU/L (ref 44–121)
BUN/Creatinine Ratio: 14 (ref 12–28)
BUN: 8 mg/dL (ref 8–27)
Bilirubin Total: 0.6 mg/dL (ref 0.0–1.2)
CO2: 28 mmol/L (ref 20–29)
Calcium: 9.6 mg/dL (ref 8.7–10.3)
Chloride: 101 mmol/L (ref 96–106)
Creatinine, Ser: 0.59 mg/dL (ref 0.57–1.00)
GFR calc Af Amer: 109 mL/min/{1.73_m2} (ref >59)
GFR calc non Af Amer: 94 mL/min/{1.73_m2} (ref >59)
Globulin, Total: 3.1 g/dL (ref 1.5–4.5)
Glucose: 87 mg/dL (ref 65–99)
Potassium: 4.3 mmol/L (ref 3.5–5.2)
Sodium: 141 mmol/L (ref 134–144)
Total Protein: 7.1 g/dL (ref 6.0–8.5)

## 2020-06-05 LAB — CBC
Hematocrit: 39.9 % (ref 34.0–46.6)
Hemoglobin: 13.9 g/dL (ref 11.1–15.9)
MCH: 33.3 pg — ABNORMAL HIGH (ref 26.6–33.0)
MCHC: 34.8 g/dL (ref 31.5–35.7)
MCV: 96 fL (ref 79–97)
Platelets: 103 10*3/uL — ABNORMAL LOW (ref 150–450)
RBC: 4.17 x10E6/uL (ref 3.77–5.28)
RDW: 12.3 % (ref 11.7–15.4)
WBC: 3 10*3/uL — ABNORMAL LOW (ref 3.4–10.8)

## 2020-06-05 LAB — BRAIN NATRIURETIC PEPTIDE: BNP: 107 pg/mL — ABNORMAL HIGH (ref 0.0–100.0)

## 2020-06-07 ENCOUNTER — Telehealth: Payer: Self-pay | Admitting: Nurse Practitioner

## 2020-06-07 DIAGNOSIS — F1120 Opioid dependence, uncomplicated: Secondary | ICD-10-CM | POA: Diagnosis not present

## 2020-06-07 NOTE — Telephone Encounter (Signed)
Called patient to discuss labs. BNP was elevated. Patient has an appointment to establish care with PCP on Friday and an appointment with cardiology in 2 weeks. Patient was prescribed short course of diuretic for edema at last visit and states that this did help. Advised to follow with PCP on Friday - may need daily diuretic prescribed.

## 2020-06-11 ENCOUNTER — Other Ambulatory Visit: Payer: Self-pay

## 2020-06-11 ENCOUNTER — Encounter: Payer: Self-pay | Admitting: Nurse Practitioner

## 2020-06-11 ENCOUNTER — Ambulatory Visit (INDEPENDENT_AMBULATORY_CARE_PROVIDER_SITE_OTHER): Payer: Medicare HMO | Admitting: Nurse Practitioner

## 2020-06-11 VITALS — BP 111/49 | HR 57 | Temp 97.6°F | Ht 63.0 in | Wt 201.0 lb

## 2020-06-11 DIAGNOSIS — Z7689 Persons encountering health services in other specified circumstances: Secondary | ICD-10-CM

## 2020-06-11 DIAGNOSIS — Z8616 Personal history of COVID-19: Secondary | ICD-10-CM

## 2020-06-11 DIAGNOSIS — R601 Generalized edema: Secondary | ICD-10-CM

## 2020-06-11 DIAGNOSIS — F1910 Other psychoactive substance abuse, uncomplicated: Secondary | ICD-10-CM

## 2020-06-11 DIAGNOSIS — R7303 Prediabetes: Secondary | ICD-10-CM

## 2020-06-11 MED ORDER — FUROSEMIDE 20 MG PO TABS: 20.0000 mg | ORAL_TABLET | Freq: Every day | ORAL | 0 refills | Status: DC

## 2020-06-11 NOTE — Patient Instructions (Addendum)
Edema  Edema is when you have too much fluid in your body or under your skin. Edema may make your legs, feet, and ankles swell up. Swelling is also common in looser tissues, like around your eyes. This is a common condition. It gets more common as you get older. There are many possible causes of edema. Eating too much salt (sodium) and being on your feet or sitting for a long time can cause edema in your legs, feet, and ankles. Hot weather may make edema worse. Edema is usually painless. Your skin may look swollen or shiny. Follow these instructions at home:  Keep the swollen body part raised (elevated) above the level of your heart when you are sitting or lying down.  Do not sit still or stand for a long time.  Do not wear tight clothes. Do not wear garters on your upper legs.  Exercise your legs. This can help the swelling go down.  Wear elastic bandages or support stockings as told by your doctor.  Eat a low-salt (low-sodium) diet to reduce fluid as told by your doctor.  Depending on the cause of your swelling, you may need to limit how much fluid you drink (fluid restriction).  Take over-the-counter and prescription medicines only as told by your doctor. Contact a doctor if:  Treatment is not working.  You have heart, liver, or kidney disease and have symptoms of edema.  You have sudden and unexplained weight gain. Get help right away if:  You have shortness of breath or chest pain.  You cannot breathe when you lie down.  You have pain, redness, or warmth in the swollen areas.  You have heart, liver, or kidney disease and get edema all of a sudden.  You have a fever and your symptoms get worse all of a sudden. Summary  Edema is when you have too much fluid in your body or under your skin.  Edema may make your legs, feet, and ankles swell up. Swelling is also common in looser tissues, like around your eyes.  Raise (elevate) the swollen body part above the level of your  heart when you are sitting or lying down.  Follow your doctor's instructions about diet and how much fluid you can drink (fluid restriction). This information is not intended to replace advice given to you by your health care provider. Make sure you discuss any questions you have with your health care provider. Document Revised: 06/15/2017 Document Reviewed: 06/30/2016 Elsevier Patient Education  [place].     Heart Failure, Diagnosis  Heart failure means that your heart is not able to pump blood in the right way. This makes it hard for your body to work well. Heart failure is usually a long-term (chronic) condition. You must take good care of yourself and follow your treatment plan from your doctor. What are the causes? This condition may be caused by:  High blood pressure.  Build up of cholesterol and fat in the arteries.  Heart attack. This injures the heart muscle.  Heart valves that do not open and close properly.  Damage of the heart muscle. This is also called cardiomyopathy.  Lung disease.  Abnormal heart rhythms. What increases the risk? The risk of heart failure goes up as a person ages. This condition is also more likely to develop in people who:  Are overweight.  Are female.  Smoke or chew tobacco.  Abuse alcohol or illegal drugs.  Have taken medicines that can damage the heart.  Have diabetes.  Have abnormal heart rhythms.  Have thyroid problems.  Have low blood counts (anemia). What are the signs or symptoms? Symptoms of this condition include:  Shortness of breath.  Coughing.  Swelling of the feet, ankles, legs, or belly.  Losing weight for no reason.  Trouble breathing.  Waking from sleep because of the need to sit up and get more air.  Rapid heartbeat.  Being very tired.  Feeling dizzy, or feeling like you may pass out (faint).  Having no desire to eat.  Feeling like you may vomit (nauseous).  Peeing (urinating) more  at night.  Feeling confused. How is this treated?     This condition may be treated with:  Medicines. These can be given to treat blood pressure and to make the heart muscles stronger.  Changes in your daily life. These may include eating a healthy diet, staying at a healthy body weight, quitting tobacco and illegal drug use, or doing exercises.  Surgery. Surgery can be done to open blocked valves, or to put devices in the heart, such as pacemakers.  A donor heart (heart transplant). You will receive a healthy heart from a donor. Follow these instructions at home:  Treat other conditions as told by your doctor. These may include high blood pressure, diabetes, thyroid disease, or abnormal heart rhythms.  Learn as much as you can about heart failure.  Get support as you need it.  Keep all follow-up visits as told by your doctor. This is important. Summary  Heart failure means that your heart is not able to pump blood in the right way.  This condition is caused by high blood pressure, heart attack, or damage of the heart muscle.  Symptoms of this condition include shortness of breath and swelling of the feet, ankles, legs, or belly. You may also feel very tired or feel like you may vomit.  You may be treated with medicines, surgery, or changes in your daily life.  Treat other health conditions as told by your doctor. This information is not intended to replace advice given to you by your health care provider. Make sure you discuss any questions you have with your health care provider. Document Revised: 08/30/2018 Document Reviewed: 08/30/2018 Elsevier Patient Education  [place].     Prediabetes Eating Plan Prediabetes is a condition that causes blood sugar (glucose) levels to be higher than normal. This increases the risk for developing diabetes. In order to prevent diabetes from developing, your health care provider may recommend a diet and other lifestyle changes  to help you:  Control your blood glucose levels.  Improve your cholesterol levels.  Manage your blood pressure. Your health care provider may recommend working with a diet and nutrition specialist (dietitian) to make a meal plan that is best for you. What are tips for following this plan? Lifestyle  Set weight loss goals with the help of your health care team. It is recommended that most people with prediabetes lose 7% of their current body weight.  Exercise for at least 30 minutes at least 5 days a week.  Attend a support group or seek ongoing support from a mental health counselor.  Take over-the-counter and prescription medicines only as told by your health care provider. Reading food labels  Read food labels to check the amount of fat, salt (sodium), and sugar in prepackaged foods. Avoid foods that have: ? Saturated fats. ? Trans fats. ? Added sugars.  Avoid foods that have more than 300  milligrams (mg) of sodium per serving. Limit your daily sodium intake to less than 2,300 mg each day. Shopping  Avoid buying pre-made and processed foods. Cooking  Cook with olive oil. Do not use butter, lard, or ghee.  Bake, broil, grill, or boil foods. Avoid frying. Meal planning   Work with your dietitian to develop an eating plan that is right for you. This may include: ? Tracking how many calories you take in. Use a food diary, notebook, or mobile application to track what you eat at each meal. ? Using the glycemic index (GI) to plan your meals. The index tells you how quickly a food will raise your blood glucose. Choose low-GI foods. These foods take a longer time to raise blood glucose.  Consider following a Mediterranean diet. This diet includes: ? Several servings each day of fresh fruits and vegetables. ? Eating fish at least twice a week. ? Several servings each day of whole grains, beans, nuts, and seeds. ? Using olive oil instead of other fats. ? Moderate alcohol  consumption. ? Eating small amounts of red meat and whole-fat dairy.  If you have high blood pressure, you may need to limit your sodium intake or follow a diet such as the DASH eating plan. DASH is an eating plan that aims to lower high blood pressure. What foods are recommended? The items listed below may not be a complete list. Talk with your dietitian about what dietary choices are best for you. Grains Whole grains, such as whole-wheat or whole-grain breads, crackers, cereals, and pasta. Unsweetened oatmeal. Bulgur. Barley. Quinoa. Brown rice. Corn or whole-wheat flour tortillas or taco shells. Vegetables Lettuce. Spinach. Peas. Beets. Cauliflower. Cabbage. Broccoli. Carrots. Tomatoes. Squash. Eggplant. Herbs. Peppers. Onions. Cucumbers. Brussels sprouts. Fruits Berries. Bananas. Apples. Oranges. Grapes. Papaya. Mango. Pomegranate. Kiwi. Grapefruit. Cherries. Meats and other protein foods Seafood. Poultry without skin. Lean cuts of pork and beef. Tofu. Eggs. Nuts. Beans. Dairy Low-fat or fat-free dairy products, such as yogurt, cottage cheese, and cheese. Beverages Water. Tea. Coffee. Sugar-free or diet soda. Seltzer water. Lowfat or no-fat milk. Milk alternatives, such as soy or almond milk. Fats and oils Olive oil. Canola oil. Sunflower oil. Grapeseed oil. Avocado. Walnuts. Sweets and desserts Sugar-free or low-fat pudding. Sugar-free or low-fat ice cream and other frozen treats. Seasoning and other foods Herbs. Sodium-free spices. Mustard. Relish. Low-fat, low-sugar ketchup. Low-fat, low-sugar barbecue sauce. Low-fat or fat-free mayonnaise. What foods are not recommended? The items listed below may not be a complete list. Talk with your dietitian about what dietary choices are best for you. Grains Refined white flour and flour products, such as bread, pasta, snack foods, and cereals. Vegetables Canned vegetables. Frozen vegetables with butter or cream sauce. Fruits Fruits canned  with syrup. Meats and other protein foods Fatty cuts of meat. Poultry with skin. Breaded or fried meat. Processed meats. Dairy Full-fat yogurt, cheese, or milk. Beverages Sweetened drinks, such as sweet iced tea and soda. Fats and oils Butter. Lard. Ghee. Sweets and desserts Baked goods, such as cake, cupcakes, pastries, cookies, and cheesecake. Seasoning and other foods Spice mixes with added salt. Ketchup. Barbecue sauce. Mayonnaise. Summary  To prevent diabetes from developing, you may need to make diet and other lifestyle changes to help control blood sugar, improve cholesterol levels, and manage your blood pressure.  Set weight loss goals with the help of your health care team. It is recommended that most people with prediabetes lose 7 percent of their current body weight.  Consider  following a Mediterranean diet that includes plenty of fresh fruits and vegetables, whole grains, beans, nuts, seeds, fish, lean meat, low-fat dairy, and healthy oils. This information is not intended to replace advice given to you by your health care provider. Make sure you discuss any questions you have with your health care provider. Document Revised: 10/04/2018 Document Reviewed: 08/16/2016 Elsevier Patient Education  2020 [employer].

## 2020-06-11 NOTE — Progress Notes (Signed)
[address]  [place], [place]  [address] Phone:  [phone]   Fax:  [phone]   New Patient Office Visit  Subjective:  Patient ID: Ruth Stephenson, female    DOB: 21-Jun-1951  Age: 69 y.o. MRN: [id number]  CC:  Chief Complaint  Patient presents with   Establish Care    Having a lot edema swelling all over the body.  Normal weight is around 169. Had swelling since having COVID in September     HPI MAY MANRIQUE presents for establish care. She  has a past medical history of Cancer ([place]), Hepatitis C, and HIV (human immunodeficiency virus infection) ([place]).   Edema Patient complains of edema in both lower legs and generalized. The edema has been severe. Onset of symptoms was several weeks ago, and patient reports symptoms have gradually worsened since that time. The edema is present all day. The patient states the problem is new. The swelling has been aggravated by she uses sea salt and soft drinks. The swelling has been relieved by diuretics. Associated factors include: nothing. However her BNP was elecated 107. Cardiac risk factors include advanced age (older than 63 for men, 18 for women), obesity (BMI >= 30 kg/m2) and On Methadone. Pt states she is decreasing use.   Past Medical History:  Diagnosis Date   Cancer ([place])    Hepatitis C    HIV (human immunodeficiency virus infection) ([place])     Past Surgical History:  Procedure Laterality Date   CARPAL TUNNEL RELEASE Left 01/19/2014   Procedure: LEFT CARPAL TUNNEL RELEASE;  Surgeon: Schuyler Amor, MD;  Location: [facility];  Service: Orthopedics;  Laterality: Left;   COLON RESECTION N/A 09/23/2012   Procedure: LAPAROSCOPIC RIGHT COLON RESECTION   ;  Surgeon: Edward Jolly, MD;  [facility];  Service: General;  Laterality: N/A;  LAPOROSCOPY, RIGHT HEMICOLECTOMY   COLON SURGERY  09/23/2012   lap right colectomy   COLONOSCOPY N/A 09/20/2012   Procedure:  COLONOSCOPY;  Surgeon: Beryle Beams, MD;  [facility];  Service: Endoscopy;  Laterality: N/A;   OPEN REDUCTION INTERNAL FIXATION (ORIF) DISTAL RADIAL FRACTURE Left 01/19/2014   Procedure: OPEN REDUCTION INTERNAL FIXATION (ORIF) LEFT  DISTAL RADIAL FRACTURE;  Surgeon: Schuyler Amor, MD;  Location: [facility];  Service: Orthopedics;  Laterality: Left;    Family History  Problem Relation Age of Onset   Cancer Mother     Social History   Socioeconomic History   Marital status: Single    Spouse name: Not on file   Number of children: 5   Years of education: Not on file   Highest education level: Not on file  Occupational History   Not on file  Tobacco Use   Smoking status: Current Some Day Smoker    Packs/day: 0.50    Years: 35.00    Pack years: 17.50    Types: Cigarettes   Smokeless tobacco: Never Used   Tobacco comment: trying to cut back; 4-5 cigarettes per day   Vaping Use   Vaping Use: Never used  Substance and Sexual Activity   Alcohol use: Not Currently    Comment: Occasionally; none currently as of 01/28/18   Drug use: Not Currently    Types: Cocaine, Marijuana    Comment: Heroin; in outpatient drug proram (reported on 01/28/18 visit    Sexual activity: Not on file    Comment: declined condoms  Other Topics  Concern   Not on file  Social History Narrative   ** Merged History Encounter **       Social Determinants of Health   Financial Resource Strain: Not on file  Food Insecurity: Not on file  Transportation Needs: Not on file  Physical Activity: Not on file  Stress: Not on file  Social Connections: Not on file  Intimate Partner Violence: Not on file    ROS Review of Systems  Respiratory: Negative for shortness of breath (this has resolved with the lasix ).   Cardiovascular: Positive for leg swelling. Negative for chest pain.    Objective:   Today's Vitals: BP (!) 111/49    Pulse (!) 57    Temp 97.6 F (36.4  C) (Temporal)    Ht 5\' 3"  (1.6 m)    Wt 201 lb (91.2 kg)    SpO2 97%    BMI 35.61 kg/m   Physical Exam Constitutional:      Appearance: She is obese.  HENT:     Head: Normocephalic and atraumatic.  Cardiovascular:     Rate and Rhythm: Normal rate and regular rhythm.     Pulses: Normal pulses.     Heart sounds: Normal heart sounds.  Pulmonary:     Effort: Pulmonary effort is normal.     Breath sounds: Normal breath sounds.  Abdominal:     General: Bowel sounds are normal.     Palpations: Abdomen is soft.     Comments: Obese with midabdominal weakness  Well healed old incisional scar  Musculoskeletal:     Cervical back: Normal range of motion.     Comments: Nonpitting edema    Skin:    General: Skin is warm and dry.     Capillary Refill: Capillary refill takes less than 2 seconds.  Neurological:     General: No focal deficit present.     Mental Status: She is alert and oriented to person, place, and time.  Psychiatric:        Mood and Affect: Mood normal.        Behavior: Behavior normal.        Thought Content: Thought content normal.        Judgment: Judgment normal.     Assessment & Plan:   Problem List Items Addressed This Visit      Other   Generalized edema Elevated BNP cardiology referral in place apt set. One week trial of furosemide (Pt desiring treatment for the edema) Encouraged daily weight with documentation for cardiology  Instructed pt on increased diet to potassium rich with explanation and examples.    History of COVID-19 Awaiting COVID-19 vaccination   Polysubstance abuse ([place]) Stable on Methadone    Other Visit Diagnoses    Prediabetes    -  Primary      Outpatient Encounter Medications as of 06/11/2020  Medication Sig   acetaminophen (TYLENOL) 500 MG tablet Take 1 tablet (500 mg total) by mouth every 6 (six) hours as needed for mild pain, moderate pain or headache. (Patient not taking: No sig reported)   albuterol (VENTOLIN HFA) 108  (90 Base) MCG/ACT inhaler Inhale 2 puffs into the lungs every 2 (two) hours as needed for wheezing or shortness of breath. (Patient not taking: No sig reported)   furosemide (LASIX) 20 MG tablet Take 1 tablet (20 mg total) by mouth daily for 7 days.   guaiFENesin-dextromethorphan (ROBITUSSIN DM) 100-10 MG/5ML syrup Take 5 mLs by mouth every 6 (six) hours as  needed for cough. (Patient not taking: Reported on 06/03/2020)   methadone (DOLOPHINE) 10 MG/ML solution Take 90 mg by mouth daily.    polyvinyl alcohol (LIQUIFILM TEARS) 1.4 % ophthalmic solution Place 1 drop into both eyes as needed for dry eyes. (Patient not taking: Reported on 06/03/2020)   sulfamethoxazole-trimethoprim (BACTRIM) 400-80 MG tablet TAKE 1 TABLET BY MOUTH ONCE A DAY (Patient taking differently: Take 1 tablet by mouth daily.)   SYMTUZA 800-150-200-10 MG TABS TAKE 1 TABLET BY MOUTH DAILY WITH BREAKFAST.   [DISCONTINUED] furosemide (LASIX) 20 MG tablet Take 0.5 tablets (10 mg total) by mouth daily for 3 days.   [DISCONTINUED] gabapentin (NEURONTIN) 300 MG capsule Take 1 capsule (300 mg total) by mouth 3 (three) times daily. Start out taking 1 pill before bedtime-  After a week add another pill at dinner time (pm) and at bedtime.  After another week you may add a 3rd pill in the morning for a total of 3 pills a day. (Patient not taking: No sig reported)   No facility-administered encounter medications on file as of 06/11/2020.    Follow-up: Return in about 6 weeks (around 07/23/2020) for follow up as needed.   Vevelyn Francois, NP

## 2020-06-14 DIAGNOSIS — F1120 Opioid dependence, uncomplicated: Secondary | ICD-10-CM | POA: Diagnosis not present

## 2020-06-16 ENCOUNTER — Other Ambulatory Visit: Payer: Self-pay | Admitting: Infectious Diseases

## 2020-06-16 DIAGNOSIS — B20 Human immunodeficiency virus [HIV] disease: Secondary | ICD-10-CM

## 2020-06-16 MED FILL — SYMTUZA 800-150-200-10 MG T: 800-150-200 | 30 days supply | Qty: 30 | Fill #0

## 2020-06-17 ENCOUNTER — Other Ambulatory Visit: Payer: Self-pay | Admitting: Infectious Diseases

## 2020-06-17 MED FILL — SULFAMETHOXAZOLE-TMP SS TAB: 400-80 | 30 days supply | Qty: 30 | Fill #0

## 2020-06-21 DIAGNOSIS — F1120 Opioid dependence, uncomplicated: Secondary | ICD-10-CM | POA: Diagnosis not present

## 2020-06-22 ENCOUNTER — Other Ambulatory Visit: Payer: Self-pay

## 2020-06-22 ENCOUNTER — Ambulatory Visit (INDEPENDENT_AMBULATORY_CARE_PROVIDER_SITE_OTHER): Payer: Medicare HMO | Admitting: Internal Medicine

## 2020-06-22 ENCOUNTER — Other Ambulatory Visit: Payer: Self-pay | Admitting: Family Medicine

## 2020-06-22 ENCOUNTER — Encounter: Payer: Self-pay | Admitting: Internal Medicine

## 2020-06-22 VITALS — BP 112/58 | HR 68 | Ht 63.0 in | Wt 202.2 lb

## 2020-06-22 DIAGNOSIS — R600 Localized edema: Secondary | ICD-10-CM | POA: Diagnosis not present

## 2020-06-22 DIAGNOSIS — R0602 Shortness of breath: Secondary | ICD-10-CM

## 2020-06-22 DIAGNOSIS — I712 Thoracic aortic aneurysm, without rupture, unspecified: Secondary | ICD-10-CM | POA: Insufficient documentation

## 2020-06-22 DIAGNOSIS — R601 Generalized edema: Secondary | ICD-10-CM

## 2020-06-22 DIAGNOSIS — I739 Peripheral vascular disease, unspecified: Secondary | ICD-10-CM | POA: Diagnosis not present

## 2020-06-22 MED ORDER — FUROSEMIDE 20 MG PO TABS: 20.0000 mg | ORAL_TABLET | Freq: Every day | ORAL | 0 refills | Status: DC

## 2020-06-22 MED ORDER — FUROSEMIDE 40 MG PO TABS: 40.0000 mg | ORAL_TABLET | Freq: Every day | ORAL | 3 refills | Status: DC

## 2020-06-22 MED ORDER — ROSUVASTATIN CALCIUM 5 MG PO TABS: 5.0000 mg | ORAL_TABLET | Freq: Every day | ORAL | 3 refills | Status: DC

## 2020-06-22 NOTE — Progress Notes (Signed)
Cardiology Office Note:    Date:  06/22/2020   ID:  Ruth Stephenson, DOB 16-Oct-1950, MRN [id number]  PCP:  Ruth Francois, NP  [facility] Cardiologist:  No primary care provider on file.  [facility] Electrophysiologist:  None   CC: Bilateral LE edema Consulted for the evaluation of LE Edema at the behest of Ruth Francois, NP  History of Present Illness:    Ruth Stephenson is a 69 y.o. female with a hx of HIV on HAART, Prior polysubstance abuse (tobacco and heroin and cocaine) on methadone, bilateral LE edema, Prior COVID-19 infection (02/2020), prior carpal tunnel release.   Oncological History notable for: Malignancies: Colon Cancer Surgery: 08/2012 Stage II Chemotherapy: No chemotherapy Cessations for Toxicity: N/A Radiation: None Oncology care spearheaded by: None    Patient notes that she is feeling leg swelling since COVID-19 infection.  Patient notes full body swelling:  Abdomen, legs bilaterally into her thighs.  Worse when walking all day.  Also notes bilaterally leg claudication pain worse with exertion and improved with rest.  Has had no chest pain, chest pressure, chest tightness, chest stinging.  Leg swelling improved but did not resolve with lasix medication (20 mg PO daily for 7 days.  Notes that her shortness of breath post COVID-19 is improving.  No history of pre-eclampsia.  Notes prior drug use as above.   Past Medical History:  Diagnosis Date  . Cancer ([place])   . Hepatitis C   . HIV (human immunodeficiency virus infection) ([place])     Past Surgical History:  Procedure Laterality Date  . CARPAL TUNNEL RELEASE Left 01/19/2014   Procedure: LEFT CARPAL TUNNEL RELEASE;  Surgeon: Schuyler Amor, MD;  Location: [facility];  Service: Orthopedics;  Laterality: Left;  . COLON RESECTION N/A 09/23/2012   Procedure: LAPAROSCOPIC RIGHT COLON RESECTION   ;  Surgeon: Edward Jolly, MD;  [facility];  Service: General;  Laterality: N/A;   LAPOROSCOPY, RIGHT HEMICOLECTOMY  . COLON SURGERY  09/23/2012   lap right colectomy  . COLONOSCOPY N/A 09/20/2012   Procedure: COLONOSCOPY;  Surgeon: Beryle Beams, MD;  [facility];  Service: Endoscopy;  Laterality: N/A;  . OPEN REDUCTION INTERNAL FIXATION (ORIF) DISTAL RADIAL FRACTURE Left 01/19/2014   Procedure: OPEN REDUCTION INTERNAL FIXATION (ORIF) LEFT  DISTAL RADIAL FRACTURE;  Surgeon: Schuyler Amor, MD;  Location: [facility];  Service: Orthopedics;  Laterality: Left;    Current Medications: Current Meds  Medication Sig  . furosemide (LASIX) 40 MG tablet Take 1 tablet (40 mg total) by mouth daily.  . rosuvastatin (CRESTOR) 5 MG tablet Take 1 tablet (5 mg total) by mouth daily.  Marland Kitchen sulfamethoxazole-trimethoprim (BACTRIM) 400-80 MG tablet Take 1 tablet by mouth daily. For further refills please call office to schedule follow up appointment  . SYMTUZA 800-150-200-10 MG TABS TAKE 1 TABLET BY MOUTH DAILY WITH BREAKFAST.     Allergies:   Patient has no known allergies.   Social History   Socioeconomic History  . Marital status: Single    Spouse name: Not on file  . Number of children: 5  . Years of education: Not on file  . Highest education level: Not on file  Occupational History  . Not on file  Tobacco Use  . Smoking status: Current Some Day Smoker    Packs/day: 0.50    Years: 35.00    Pack years: 17.50    Types: Cigarettes  . Smokeless tobacco:  Never Used  . Tobacco comment: trying to cut back; 4-5 cigarettes per day   Vaping Use  . Vaping Use: Never used  Substance and Sexual Activity  . Alcohol use: Not Currently    Comment: Occasionally; none currently as of 01/28/18  . Drug use: Not Currently    Types: Cocaine, Marijuana    Comment: Heroin; in outpatient drug proram (reported on 01/28/18 visit   . Sexual activity: Not on file    Comment: declined condoms  Other Topics Concern  . Not on file  Social History Narrative   ** Merged  History Encounter **       Social Determinants of Health   Financial Resource Strain: Not on file  Food Insecurity: Not on file  Transportation Needs: Not on file  Physical Activity: Not on file  Stress: Not on file  Social Connections: Not on file     Family History: [name] family history includes Cancer in her mother. History of coronary artery disease notable for no members. History of heart failure notable for no members. No history of cardiomyopathies including hypertrophic cardiomyopathy, left ventricular non-compaction, or arrhythmogenic right ventricular cardiomyopathy. History of arrhythmia notable for no members. Denies family history of sudden cardiac death including drowning, car accidents, or unexplained deaths in the family. No history of bicuspid aortic valve or aortic aneurysm or dissection.  ROS:   Please see the history of present illness.    All other systems reviewed and are negative.  EKGs/Labs/Other Studies Reviewed:    The following studies were reviewed today:   EKG:   06/22/20 Sinus Rhythm 68 QTc 340  Transthoracic Echocardiogram: Date:05/05/2017 Results: Ascending Aorta not well visualized Study Conclusions  - Left ventricle: The cavity size was normal. Wall thickness was  increased in a pattern of mild LVH. Systolic function was normal.  The estimated ejection fraction was in the range of 60% to 65%.  Wall motion was normal; there were no regional wall motion  abnormalities. The study is not technically sufficient to allow  evaluation of LV diastolic function.  - Aortic valve: Mildly calcified annulus. Trileaflet.  - Mitral valve: There was mild regurgitation.  - Right atrium: Central venous pressure (est): 15 mm Hg.  - Tricuspid valve: There was trivial regurgitation.  - Pulmonary arteries: Systolic pressure could not be accurately  estimated.  - Pericardium, extracardiac: There was no pericardial effusion.    NonCardiac CT: Date:01/05/2018 Results: Mild Ascending Aortic Dilation by height index (39 mm)- indexed to height 2.43 Aortic Arch Atherosclerosis   Recent Labs: 03/17/2020: Magnesium 1.9 06/04/2020: ALT 10; BNP 107.0; BUN 8; Creatinine, Ser 0.59; Hemoglobin 13.9; Platelets 103; Potassium 4.3; Sodium 141  Recent Lipid Panel    Component Value Date/Time   CHOL 163 12/30/2019 1523   TRIG 77 03/14/2020 1849   HDL 59 12/30/2019 1523   CHOLHDL 2.8 12/30/2019 1523   VLDL 14 04/12/2016 1600   LDLCALC 88 12/30/2019 1523     Risk Assessment/Calculations:     N/A  Physical Exam:    VS:  BP (!) 112/58   Pulse 68   Ht _0  (1.6 m)   Wt 202 lb 3.2 oz (91.7 kg)   SpO2 95%   BMI 35.82 kg/m     Wt Readings from Last 3 Encounters:  06/22/20 202 lb 3.2 oz (91.7 kg)  06/11/20 201 lb (91.2 kg)  06/03/20 201 lb 12 oz (91.5 kg)     GEN: Obese female.  Well developed in no  acute distress HEENT: Normal NECK: No JVD; No carotid bruits LYMPHATICS: No lymphadenopathy CARDIAC: RRR, no murmurs, rubs, gallops RESPIRATORY:  Clear to auscultation without rales, wheezing or rhonchi  ABDOMEN: Soft, non-tenderly distended without fluid wave MUSCULOSKELETAL:  Non pitting edema to thighs; No deformity  SKIN: Warm; abdominal scar well healed NEUROLOGIC:  Alert and oriented x 3 PSYCHIATRIC:  Normal affect   ASSESSMENT:    1. Bilateral edema of lower extremity   2. Shortness of breath   3. Claudication (HCC)   4. Thoracic aortic aneurysm without rupture (HCC)    PLAN:    In order of problems listed above:  Lower extremity Edema (bilateral) with concern for venous insufficiency - with leg claudication (bilateral) - will get echocardiogram - will get Arterial duplex bilateral and ABI - will get venous duplex bilaterally - will start utilization of compression stockings -  At next visit will get UA and thyroid studies for rule out hypoproteinemia, nephrotic syndrome, thyroid disease -  on no iatrogenic medications - Starting 40 mg po lasix daily - CMP and Mg in 7-10 days - if no improvement by follow up visit, and if no other cause, will send to vein/vascular specialist for evaluation  In subsequent visits  Aortic atherosclerosis HIV -LDL goal less than 70 in the setting of the above - Recheck lipid profile LFTs in 3 months - gave education on dietary changes  Long Term Drug Monitoring prior Methadone - QTc not prolonged  Asymptomatic thoracic aortic aneurysm - Last at 3.9,  indexed diameter 2.34 - Last scan 2019; will echocardiogram - Primary Prevention:  Smoking Cessation  - If significant increase, we would discuss with ID the merit of TPHA, micro-haemagglutination test, flourescent treponemal antibody absorption test in the setting of TAA and HIV history  .Tobacco Abuse - discussed the dangers of tobacco use, both inhaled and oral, which include, but are not limited to cardiovascular disease, increased cancer risk of multiple types of cancer, COPD, peripheral arterial disease, strokes. - counseled on the benefits of smoking cessation. - firmly advised to quit.  - we also reviewed strategies to maximize success, including:  Removing cigarettes and smoking materials from environment   Stress management  Substitution of other forms of reinforcement (the one cigarette a day approach)  Support of family/friends and group smoking cessation  Selecting a quit date  Patient provided contact information for [employer] or 1-800-QUIT-NOW  Patient provided with [employer] 8 free smoking cessation classes: (336) (303)866-8229 and CarWashShow.fr   2-3 follow up unless new symptoms or abnormal test results warranting change in plan  Would be reasonable for  APP Follow up   Medication Adjustments/Labs and Tests Ordered: Current medicines are reviewed at length with the patient today.  Concerns regarding medicines are outlined above.  Orders Placed  This Encounter  Procedures  . Comp Met (CMET)  . Magnesium  . EKG 12-Lead  . ECHOCARDIOGRAM COMPLETE  . VAS [place] LOWER EXT ART SEG MULTI (SEGMENTALS & LE RAYNAUDS)  . VAS [place] ABI WITH/WO TBI  . VAS [place] LOWER EXTREMITY VENOUS (DVT)   Meds ordered this encounter  Medications  . rosuvastatin (CRESTOR) 5 MG tablet    Sig: Take 1 tablet (5 mg total) by mouth daily.    Dispense:  90 tablet    Refill:  3  . furosemide (LASIX) 40 MG tablet    Sig: Take 1 tablet (40 mg total) by mouth daily.    Dispense:  90 tablet    Refill:  3    Patient Instructions  Medication Instructions:  Your physician has recommended you make the following change in your medication:  1.  START Rosuvastatin 5 mg taking 1 daily 2.  START Lasix 40 mg taking 1 daily   *If you need a refill on your cardiac medications before your next appointment, please call your pharmacy*   Lab Work: 7-10 days:   CMET & MAG  If you have labs (blood work) drawn today and your tests are completely normal, you will receive your results only by: Marland Kitchen MyChart Message (if you have MyChart) OR . A paper copy in the mail If you have any lab test that is abnormal or we need to change your treatment, we will call you to review the results.   Testing/Procedures: Your physician has requested that you have a lower or upper extremity arterial duplex with abi's.  This test is an ultrasound of the arteries in the legs or arms. It looks at arterial blood flow in the legs and arms. Allow one hour for Lower and Upper Arterial scans. There are no restrictions or special instructions  Your physician has requested that you have a lower or upper extremity venous duplex. This test is an ultrasound of the veins in the legs or arms. It looks at venous blood flow that carries blood from the heart to the legs or arms. Allow one hour for a Lower Venous exam. Allow thirty minutes for an Upper Venous exam. There are no restrictions or special  instructions.    Your physician has requested that you have an echocardiogram. Echocardiography is a painless test that uses sound waves to create images of your heart. It provides your doctor with information about the size and shape of your heart and how well your heart's chambers and valves are working. This procedure takes approximately one hour. There are no restrictions for this procedure.     Follow-Up: At [facility], you and your health needs are our priority.  As part of our continuing mission to provide you with exceptional heart care, we have created designated Provider Care Teams.  These Care Teams include your primary Cardiologist (physician) and Advanced Practice Providers (APPs -  Physician Assistants and Nurse Practitioners) who all work together to provide you with the care you need, when you need it.  We recommend signing up for the patient portal called "MyChart".  Sign up information is provided on this After Visit Summary.  MyChart is used to connect with patients for Virtual Visits (Telemedicine).  Patients are able to view lab/test results, encounter notes, upcoming appointments, etc.  Non-urgent messages can be sent to your provider as well.   To learn more about what you can do with MyChart, go to [email or web address].    Your next appointment:   3 month(s)  The format for your next appointment:   In Person  Provider:   Rudean Haskell, MD   Other Instructions      Signed, Werner Lean, MD  06/22/2020 5:19 PM    [place]

## 2020-06-22 NOTE — Patient Instructions (Addendum)
Medication Instructions:  Your physician has recommended you make the following change in your medication:  1.  START Rosuvastatin 5 mg taking 1 daily 2.  START Lasix 40 mg taking 1 daily   *If you need a refill on your cardiac medications before your next appointment, please call your pharmacy*   Lab Work: 7-10 days:   CMET & MAG  If you have labs (blood work) drawn today and your tests are completely normal, you will receive your results only by: Marland Kitchen MyChart Message (if you have MyChart) OR . A paper copy in the mail If you have any lab test that is abnormal or we need to change your treatment, we will call you to review the results.   Testing/Procedures: Your physician has requested that you have a lower or upper extremity arterial duplex with abi's.  This test is an ultrasound of the arteries in the legs or arms. It looks at arterial blood flow in the legs and arms. Allow one hour for Lower and Upper Arterial scans. There are no restrictions or special instructions  Your physician has requested that you have a lower or upper extremity venous duplex. This test is an ultrasound of the veins in the legs or arms. It looks at venous blood flow that carries blood from the heart to the legs or arms. Allow one hour for a Lower Venous exam. Allow thirty minutes for an Upper Venous exam. There are no restrictions or special instructions.    Your physician has requested that you have an echocardiogram. Echocardiography is a painless test that uses sound waves to create images of your heart. It provides your doctor with information about the size and shape of your heart and how well your heart's chambers and valves are working. This procedure takes approximately one hour. There are no restrictions for this procedure.     Follow-Up: At [facility], you and your health needs are our priority.  As part of our continuing mission to provide you with exceptional heart care, we have created  designated Provider Care Teams.  These Care Teams include your primary Cardiologist (physician) and Advanced Practice Providers (APPs -  Physician Assistants and Nurse Practitioners) who all work together to provide you with the care you need, when you need it.  We recommend signing up for the patient portal called "MyChart".  Sign up information is provided on this After Visit Summary.  MyChart is used to connect with patients for Virtual Visits (Telemedicine).  Patients are able to view lab/test results, encounter notes, upcoming appointments, etc.  Non-urgent messages can be sent to your provider as well.   To learn more about what you can do with MyChart, go to [email or web address].    Your next appointment:   3 month(s)  The format for your next appointment:   In Person  Provider:   Riley Lam, MD   Other Instructions

## 2020-06-23 ENCOUNTER — Other Ambulatory Visit: Payer: Self-pay | Admitting: Internal Medicine

## 2020-06-23 DIAGNOSIS — R600 Localized edema: Secondary | ICD-10-CM

## 2020-06-23 DIAGNOSIS — I739 Peripheral vascular disease, unspecified: Secondary | ICD-10-CM

## 2020-06-25 ENCOUNTER — Other Ambulatory Visit: Payer: Self-pay | Admitting: Infectious Diseases

## 2020-06-28 DIAGNOSIS — F1120 Opioid dependence, uncomplicated: Secondary | ICD-10-CM | POA: Diagnosis not present

## 2020-06-30 ENCOUNTER — Other Ambulatory Visit: Payer: Self-pay

## 2020-06-30 ENCOUNTER — Other Ambulatory Visit: Payer: Self-pay | Admitting: Infectious Diseases

## 2020-06-30 ENCOUNTER — Inpatient Hospital Stay (HOSPITAL_COMMUNITY): Admission: RE | Admit: 2020-06-30 | Payer: Medicare HMO | Source: Ambulatory Visit

## 2020-06-30 ENCOUNTER — Other Ambulatory Visit: Payer: Medicare HMO | Admitting: *Deleted

## 2020-06-30 DIAGNOSIS — R600 Localized edema: Secondary | ICD-10-CM

## 2020-06-30 DIAGNOSIS — R0602 Shortness of breath: Secondary | ICD-10-CM

## 2020-06-30 LAB — COMPREHENSIVE METABOLIC PANEL
ALT: 12 IU/L (ref 0–32)
AST: 22 IU/L (ref 0–40)
Albumin/Globulin Ratio: 1.4 (ref 1.2–2.2)
Albumin: 3.9 g/dL (ref 3.8–4.8)
Alkaline Phosphatase: 74 IU/L (ref 44–121)
BUN/Creatinine Ratio: 14 (ref 12–28)
BUN: 11 mg/dL (ref 8–27)
Bilirubin Total: 0.9 mg/dL (ref 0.0–1.2)
CO2: 34 mmol/L — ABNORMAL HIGH (ref 20–29)
Calcium: 9.8 mg/dL (ref 8.7–10.3)
Chloride: 103 mmol/L (ref 96–106)
Creatinine, Ser: 0.77 mg/dL (ref 0.57–1.00)
GFR calc Af Amer: 92 mL/min/{1.73_m2} (ref >59)
GFR calc non Af Amer: 80 mL/min/{1.73_m2} (ref >59)
Globulin, Total: 2.8 g/dL (ref 1.5–4.5)
Glucose: 80 mg/dL (ref 65–99)
Potassium: 4.3 mmol/L (ref 3.5–5.2)
Sodium: 143 mmol/L (ref 134–144)
Total Protein: 6.7 g/dL (ref 6.0–8.5)

## 2020-06-30 LAB — MAGNESIUM: Magnesium: 1.8 mg/dL (ref 1.6–2.3)

## 2020-06-30 NOTE — Progress Notes (Signed)
Subjective:    Patient ID: Ruth Stephenson, female    DOB: 1950/09/17, 70 y.o.   MRN: [id number]  HPI 70yo F with HIV+ since 1989, on genvoya--> Symtuza. She also has a hx of Hep C treated ([facility]) 2015.She had a liver u/s in 2014 that showed that she was cirrhotic. In 08-2012 she had surgery for colon cancer(II).  She had CT abd 04-2017:Cirrhotic morphology of the liver. Hyperdense focus within the anterior left hepatic lobe ; when clinically feasible, evaluation with liver MRI is suggested, preferably as an outpatient when the patient is able to breath hold.  LastOnc f/u was 01-2018 and she was to have f/u colonoscopy as well as MRI of liver to f/u prev lesion.   She was in hospital 03-14-20 to 9-23 for COVID. She received steroids, remdesivir. She got COVID vax [facility] November 2021. She has questions about booster vax today.   She was sent for MRI of liver and repeat colon at her f/u 03-2020- those appts not kept.   She has had no difficulty with her symtuza.  Has gained from having had COVID. Was started on diuretics which helped some but incomplete.  Has gained wt and has concerns about this as well as LE edema. She had doppler today (-) by her report.   HIV 1 RNA Quant (copies/mL)  Date Value  12/30/2019 <20 NOT DETECTED  07/10/2019 281 (H)  09/09/2018 <20 NOT DETECTED   CD4 T Cell Abs (/uL)  Date Value  12/30/2019 221 (L)  01/02/2018 240 (L)  07/11/2017 210 (L)   Health Maintenance  Topic Date Due  . MAMMOGRAM  Never done  . INFLUENZA VACCINE  09/23/2020 (Originally 01/25/2020)  . COVID-19 Vaccine (1) 12/09/2020 (Originally 04[birth year]30/1964)  . DEXA SCAN  06/12/2021 (Originally 04/30/2017)  . TETANUS/TDAP  06/12/2021 (Originally 05/01/1971)  . PNA vac Low Risk Adult (2 of 2 - PPSV23) 06/12/2021 (Originally 07/14/2018)  . COLONOSCOPY (Pts 45-87yrs Insurance coverage will need to be confirmed)  09/21/2022  . Hepatitis C Screening  Completed   She is tapering off  methadone.   Review of Systems  Constitutional: Positive for appetite change and unexpected weight change.  Respiratory: Positive for shortness of breath.   Gastrointestinal: Negative for constipation and diarrhea.  Genitourinary: Negative for difficulty urinating.   Please see HPI. All other systems reviewed and negative.    Objective:   Physical Exam Vitals reviewed.  Constitutional:      Appearance: Normal appearance. She is obese.  HENT:     Mouth/Throat:     Mouth: Mucous membranes are moist.     Pharynx: No oropharyngeal exudate.  Eyes:     Extraocular Movements: Extraocular movements intact.     Pupils: Pupils are equal, round, and reactive to light.  Cardiovascular:     Rate and Rhythm: Normal rate and regular rhythm.  Pulmonary:     Effort: Pulmonary effort is normal.     Breath sounds: Normal breath sounds.  Abdominal:     General: Bowel sounds are normal. There is no distension.     Palpations: Abdomen is soft.     Tenderness: There is no abdominal tenderness.  Musculoskeletal:        General: Normal range of motion.     Cervical back: Normal range of motion and neck supple.     Right lower leg: Edema present.     Left lower leg: Edema present.  Neurological:     Mental Status: She is alert.  Psychiatric:        Mood and Affect: Mood normal.           Assessment & Plan:

## 2020-07-01 ENCOUNTER — Encounter: Payer: Self-pay | Admitting: Infectious Diseases

## 2020-07-01 ENCOUNTER — Ambulatory Visit (HOSPITAL_COMMUNITY)
Admission: RE | Admit: 2020-07-01 | Discharge: 2020-07-01 | Disposition: A | Discharge status: Home / Self Care | Payer: Medicare HMO | Source: Ambulatory Visit | Attending: Cardiovascular Disease | Admitting: Cardiovascular Disease

## 2020-07-01 ENCOUNTER — Ambulatory Visit (INDEPENDENT_AMBULATORY_CARE_PROVIDER_SITE_OTHER): Payer: Medicare HMO | Admitting: Infectious Diseases

## 2020-07-01 VITALS — BP 106/64 | HR 69 | Wt 200.6 lb

## 2020-07-01 DIAGNOSIS — Z79899 Other long term (current) drug therapy: Secondary | ICD-10-CM | POA: Diagnosis not present

## 2020-07-01 DIAGNOSIS — R0602 Shortness of breath: Secondary | ICD-10-CM | POA: Diagnosis not present

## 2020-07-01 DIAGNOSIS — R600 Localized edema: Secondary | ICD-10-CM | POA: Diagnosis not present

## 2020-07-01 DIAGNOSIS — B20 Human immunodeficiency virus [HIV] disease: Secondary | ICD-10-CM

## 2020-07-01 DIAGNOSIS — Z720 Tobacco use: Secondary | ICD-10-CM

## 2020-07-01 DIAGNOSIS — Z113 Encounter for screening for infections with a predominantly sexual mode of transmission: Secondary | ICD-10-CM | POA: Diagnosis not present

## 2020-07-01 DIAGNOSIS — C182 Malignant neoplasm of ascending colon: Secondary | ICD-10-CM | POA: Diagnosis not present

## 2020-07-01 DIAGNOSIS — E669 Obesity, unspecified: Secondary | ICD-10-CM | POA: Diagnosis not present

## 2020-07-01 NOTE — Assessment & Plan Note (Signed)
Encouraged her to diet and exercise.  States she is exercising.

## 2020-07-01 NOTE — Assessment & Plan Note (Signed)
Wants to talk about [place] at next visit.  Will set up for mammo, pap Will continue symtuza for now.  Will check her labs at her f/u visit.  rtc in 9 months.

## 2020-07-01 NOTE — Assessment & Plan Note (Signed)
Will repeat her colon Will check MRI of liver Get her back in with Onc.

## 2020-07-01 NOTE — Assessment & Plan Note (Signed)
Down to 1/4 ppd.  Encouraged to quit.

## 2020-07-02 ENCOUNTER — Other Ambulatory Visit: Payer: Medicare HMO

## 2020-07-02 ENCOUNTER — Inpatient Hospital Stay (HOSPITAL_COMMUNITY): Admission: RE | Admit: 2020-07-02 | Payer: Medicare HMO | Source: Ambulatory Visit

## 2020-07-05 DIAGNOSIS — F1120 Opioid dependence, uncomplicated: Secondary | ICD-10-CM | POA: Diagnosis not present

## 2020-07-06 ENCOUNTER — Encounter (HOSPITAL_COMMUNITY): Payer: Self-pay

## 2020-07-07 ENCOUNTER — Other Ambulatory Visit: Payer: Self-pay | Admitting: Infectious Diseases

## 2020-07-07 ENCOUNTER — Telehealth: Payer: Self-pay | Admitting: *Deleted

## 2020-07-07 DIAGNOSIS — B20 Human immunodeficiency virus [HIV] disease: Secondary | ICD-10-CM

## 2020-07-07 NOTE — Telephone Encounter (Signed)
Received referral back to Dr. Benay Spice from Dr. Johnnye Sima. Per Dr. Benay Spice: will see her again after the MRI is done. Unable to reach patient. Sent Mychart message and will await scheduling of MRI to set up f/u here.

## 2020-07-12 ENCOUNTER — Other Ambulatory Visit (HOSPITAL_COMMUNITY): Payer: Medicare HMO

## 2020-07-12 DIAGNOSIS — F1120 Opioid dependence, uncomplicated: Secondary | ICD-10-CM | POA: Diagnosis not present

## 2020-07-13 MED FILL — SYMTUZA 800-150-200-10 MG T: 800-150-200 | 30 days supply | Qty: 30 | Fill #0

## 2020-07-19 DIAGNOSIS — F1120 Opioid dependence, uncomplicated: Secondary | ICD-10-CM | POA: Diagnosis not present

## 2020-07-20 ENCOUNTER — Other Ambulatory Visit: Payer: Medicare HMO

## 2020-07-20 ENCOUNTER — Telehealth: Payer: Self-pay | Admitting: *Deleted

## 2020-07-20 NOTE — Telephone Encounter (Signed)
Called patient to inform her that Dr. Johnnye Sima wants her to see Dr. Benay Spice again. Dr. Benay Spice will see her after the MRI is done (was cancelled by patient). She agrees to reschedule the MRI with [facility] today. She has not heard from Dr. Ulyses Amor office yet for f/u colonoscopy.

## 2020-07-25 ENCOUNTER — Other Ambulatory Visit: Payer: Medicare HMO

## 2020-07-26 DIAGNOSIS — F1120 Opioid dependence, uncomplicated: Secondary | ICD-10-CM | POA: Diagnosis not present

## 2020-07-27 ENCOUNTER — Encounter (HOSPITAL_COMMUNITY): Payer: Self-pay | Admitting: Internal Medicine

## 2020-07-27 ENCOUNTER — Ambulatory Visit (HOSPITAL_COMMUNITY): Payer: Medicare HMO | Attending: Internal Medicine

## 2020-07-28 ENCOUNTER — Encounter: Payer: Self-pay | Admitting: *Deleted

## 2020-07-28 NOTE — Progress Notes (Signed)
Noted that MRI ordered by Dr. Johnnye Sima is authorized, but not scheduled. Sent Mychart message to patient and provided telephone # for her to call to schedule her MRI. Also sent message to Dr. Gasper Sells to alert her that patient was a "no show" for her ECHO yesterday.

## 2020-08-02 DIAGNOSIS — F1120 Opioid dependence, uncomplicated: Secondary | ICD-10-CM | POA: Diagnosis not present

## 2020-08-03 ENCOUNTER — Other Ambulatory Visit: Payer: Self-pay | Admitting: Infectious Diseases

## 2020-08-05 ENCOUNTER — Other Ambulatory Visit: Payer: Self-pay | Admitting: Infectious Diseases

## 2020-08-05 MED FILL — SYMTUZA 800-150-200-10 MG T: 800-150-200 | 30 days supply | Qty: 30 | Fill #1

## 2020-08-09 DIAGNOSIS — F1120 Opioid dependence, uncomplicated: Secondary | ICD-10-CM | POA: Diagnosis not present

## 2020-08-10 DIAGNOSIS — F1120 Opioid dependence, uncomplicated: Secondary | ICD-10-CM | POA: Diagnosis not present

## 2020-08-16 ENCOUNTER — Encounter (HOSPITAL_COMMUNITY): Payer: Self-pay | Admitting: Internal Medicine

## 2020-08-16 ENCOUNTER — Encounter (HOSPITAL_COMMUNITY): Payer: Self-pay

## 2020-08-16 ENCOUNTER — Ambulatory Visit (HOSPITAL_COMMUNITY): Payer: Medicare HMO | Attending: Cardiovascular Disease

## 2020-08-16 DIAGNOSIS — F1120 Opioid dependence, uncomplicated: Secondary | ICD-10-CM | POA: Diagnosis not present

## 2020-08-16 NOTE — Progress Notes (Signed)
Verified appointment "no show" status with R. Cathey at 15:59.

## 2020-08-18 ENCOUNTER — Telehealth: Payer: Self-pay | Admitting: *Deleted

## 2020-08-18 NOTE — Telephone Encounter (Signed)
Confirmed w/patient that her MRI is scheduled for 09/01/20. MD wants to see her the following week and she agrees. Scheduling message sent for OV week of 3/14

## 2020-08-19 ENCOUNTER — Telehealth: Payer: Self-pay | Admitting: Oncology

## 2020-08-19 NOTE — Telephone Encounter (Signed)
Scheduled appt per 2/23 sch msg - no answer. Left message for patient with appt date and time

## 2020-08-23 DIAGNOSIS — F1120 Opioid dependence, uncomplicated: Secondary | ICD-10-CM | POA: Diagnosis not present

## 2020-08-30 ENCOUNTER — Telehealth (HOSPITAL_COMMUNITY): Payer: Self-pay | Admitting: Internal Medicine

## 2020-08-30 NOTE — Telephone Encounter (Signed)
Just an FYI. We have made several attempts to contact this patient including sending a letter to schedule or reschedule their echocardiogram. We will be removing the patient from the echo [place].   07/27/20 NO SHOWED -MAILED LETTER [facility]     Thank you

## 2020-08-30 NOTE — Telephone Encounter (Signed)
Will send this message to pts ordering Provider and covering RN, as an [place].

## 2020-09-01 ENCOUNTER — Inpatient Hospital Stay: Admission: RE | Admit: 2020-09-01 | Payer: Medicare HMO | Source: Ambulatory Visit

## 2020-09-02 MED FILL — SYMTUZA 800-150-200-10 MG T: 800-150-200 | 30 days supply | Qty: 30 | Fill #2

## 2020-09-09 ENCOUNTER — Telehealth: Payer: Self-pay | Admitting: Oncology

## 2020-09-09 ENCOUNTER — Inpatient Hospital Stay: Payer: Medicare HMO | Admitting: Oncology

## 2020-09-09 NOTE — Telephone Encounter (Signed)
Rescheduled today's appointment per patient's request. Patient is aware of changes. 

## 2020-09-16 ENCOUNTER — Inpatient Hospital Stay: Payer: Medicare HMO | Attending: Oncology | Admitting: Oncology

## 2020-09-16 ENCOUNTER — Ambulatory Visit: Payer: Medicare HMO | Admitting: Internal Medicine

## 2020-09-16 ENCOUNTER — Encounter: Payer: Self-pay | Admitting: *Deleted

## 2020-09-16 NOTE — Progress Notes (Signed)
"  No show" for appointment today. H/O multiple missed appointments. Will not reschedule per Dr. Benay Spice.

## 2020-09-23 ENCOUNTER — Telehealth: Payer: Self-pay | Admitting: Podiatry

## 2020-09-23 ENCOUNTER — Ambulatory Visit: Payer: Medicare HMO | Admitting: Podiatry

## 2020-09-23 NOTE — Telephone Encounter (Signed)
Took care of it, no worries

## 2020-09-23 NOTE — Telephone Encounter (Signed)
Patient on the line inquiring about appointment, stated she was originally scheduled with [facility] at 11 am but the provider has been changed to [facility] and she would like to know why, Please Advise

## 2020-09-28 ENCOUNTER — Ambulatory Visit: Payer: Medicare HMO | Admitting: Podiatrist

## 2020-09-30 ENCOUNTER — Other Ambulatory Visit (HOSPITAL_COMMUNITY): Payer: Self-pay

## 2020-09-30 ENCOUNTER — Other Ambulatory Visit: Payer: Self-pay | Admitting: Infectious Diseases

## 2020-10-01 NOTE — Telephone Encounter (Signed)
Please advise on refill request

## 2020-10-02 NOTE — Telephone Encounter (Signed)
Her CD4 is > 200. I am not sure of the indication for bactrim.  Can she clarify? Thanks jeff

## 2020-10-04 ENCOUNTER — Other Ambulatory Visit (HOSPITAL_COMMUNITY): Payer: Self-pay

## 2020-10-06 ENCOUNTER — Other Ambulatory Visit (HOSPITAL_COMMUNITY): Payer: Self-pay

## 2020-10-06 MED FILL — Darunavir-Cobic-Emtricitab-Tenofov AF Tab 800-150-200-10 MG: ORAL | 30 days supply | Qty: 30 | Fill #0 | Status: AC

## 2020-10-11 ENCOUNTER — Ambulatory Visit: Payer: Medicare HMO | Admitting: Podiatrist

## 2020-10-11 ENCOUNTER — Other Ambulatory Visit (HOSPITAL_COMMUNITY): Payer: Self-pay

## 2020-10-13 ENCOUNTER — Encounter: Payer: Self-pay | Admitting: Podiatrist

## 2020-10-13 ENCOUNTER — Other Ambulatory Visit: Payer: Self-pay

## 2020-10-13 ENCOUNTER — Ambulatory Visit (INDEPENDENT_AMBULATORY_CARE_PROVIDER_SITE_OTHER): Payer: Medicare HMO | Admitting: Podiatrist

## 2020-10-13 ENCOUNTER — Ambulatory Visit: Payer: Self-pay

## 2020-10-13 DIAGNOSIS — M79671 Pain in right foot: Secondary | ICD-10-CM | POA: Diagnosis not present

## 2020-10-13 DIAGNOSIS — M79676 Pain in unspecified toe(s): Secondary | ICD-10-CM | POA: Diagnosis not present

## 2020-10-13 DIAGNOSIS — G629 Polyneuropathy, unspecified: Secondary | ICD-10-CM | POA: Diagnosis not present

## 2020-10-13 DIAGNOSIS — R609 Edema, unspecified: Secondary | ICD-10-CM | POA: Diagnosis not present

## 2020-10-13 DIAGNOSIS — M2042 Other hammer toe(s) (acquired), left foot: Secondary | ICD-10-CM

## 2020-10-13 DIAGNOSIS — B351 Tinea unguium: Secondary | ICD-10-CM

## 2020-10-13 DIAGNOSIS — M2041 Other hammer toe(s) (acquired), right foot: Secondary | ICD-10-CM

## 2020-10-13 DIAGNOSIS — M79672 Pain in left foot: Secondary | ICD-10-CM

## 2020-10-13 NOTE — Patient Instructions (Signed)
I am going to order a venous consult for your feet and legs-  You will be getting a call from [facility] for scheduling.   Chronic Venous Insufficiency Chronic venous insufficiency is a condition where the leg veins cannot effectively pump blood from the legs to the heart. This happens when the vein walls are either stretched, weakened, or damaged, or when the valves inside the vein are damaged. With the right treatment, you should be able to continue with an active life. This condition is also called venous stasis. What are the causes? Common causes of this condition include:  High blood pressure inside the veins (venous hypertension).  Sitting or standing too long, causing increased blood pressure in the leg veins.  A blood clot that blocks blood flow in a vein (deep vein thrombosis, DVT).  Inflammation of a vein (phlebitis) that causes a blood clot to form.  Tumors in the pelvis that cause blood to back up. What increases the risk? The following factors may make you more likely to develop this condition:  Having a family history of this condition.  Obesity.  Pregnancy.  Living without enough regular physical activity or exercise (sedentary lifestyle).  Smoking.  Having a job that requires long periods of standing or sitting in one place.  Being a certain age. Women in their 63s and 26s and men in their 15s are more likely to develop this condition. What are the signs or symptoms? Symptoms of this condition include:  Veins that are enlarged, bulging, or twisted (varicose veins).  Skin breakdown or ulcers.  Reddened skin or dark discoloration of skin on the leg between the knee and ankle.  Brown, smooth, tight, and painful skin just above the ankle, usually on the inside of the leg (lipodermatosclerosis).  Swelling of the legs. How is this diagnosed? This condition may be diagnosed based on:  Your medical history.  A physical exam.  Tests, such as: ? A procedure that  creates an image of a blood vessel and nearby organs and provides information about blood flow through the blood vessel (duplex ultrasound). ? A procedure that tests blood flow (plethysmography). ? A procedure that looks at the veins using X-ray and dye (venogram). How is this treated? The goals of treatment are to help you return to an active life and to minimize pain or disability. Treatment depends on the severity of your condition, and it may include:  Wearing compression stockings. These can help relieve symptoms and help prevent your condition from getting worse. However, they do not cure the condition.  Sclerotherapy. This procedure involves an injection of a solution that shrinks damaged veins.  Surgery. This may involve: ? Removing a diseased vein (vein stripping). ? Cutting off blood flow through the vein (laser ablation surgery). ? Repairing or reconstructing a valve within the affected vein.   Follow these instructions at home:  Wear compression stockings as told by your health care provider. These stockings help to prevent blood clots and reduce swelling in your legs.  Take over-the-counter and prescription medicines only as told by your health care provider.  Stay active by exercising, walking, or doing different activities. Ask your health care provider what activities are safe for you and how much exercise you need.  Drink enough fluid to keep your urine pale yellow.  Do not use any products that contain nicotine or tobacco, such as cigarettes, e-cigarettes, and chewing tobacco. If you need help quitting, ask your health care provider.  Keep all follow-up visits  as told by your health care provider. This is important.      Contact a health care provider if you:  Have redness, swelling, or more pain in the affected area.  See a red streak or line that goes up or down from the affected area.  Have skin breakdown or skin loss in the affected area, even if the breakdown  is small.  Get an injury in the affected area. Get help right away if:  You get an injury and an open wound in the affected area.  You have: ? Severe pain that does not get better with medicine. ? Sudden numbness or weakness in the foot or ankle below the affected area. ? Trouble moving your foot or ankle. ? A fever. ? Worse or persistent symptoms. ? Chest pain. ? Shortness of breath. Summary  Chronic venous insufficiency is a condition where the leg veins cannot effectively pump blood from the legs to the heart.  Chronic venous insufficiency occurs when the vein walls become stretched, weakened, or damaged, or when valves within the vein are damaged.  Treatment depends on how severe your condition is. It often involves wearing compression stockings and may involve having a procedure.  Make sure you stay active by exercising, walking, or doing different activities. Ask your health care provider what activities are safe for you and how much exercise you need. This information is not intended to replace advice given to you by your health care provider. Make sure you discuss any questions you have with your health care provider. Document Revised: 03/05/2018 Document Reviewed: 03/05/2018 Elsevier Patient Education  2021 [employer].

## 2020-10-20 NOTE — Progress Notes (Signed)
Chief Complaint  Patient presents with  . Nail Problem    Thick painful toenails  . Callouses    Painful callus lesions bilateral     HPI: Patient is 70 y.o. female who presents today for the concerns as listed above. She relates her toenails have become thick and she finds them difficult to trim. She has painful calluses as well.  She is also concerned about the darkening skin of her legs.  She denies any pain but states she feels like she is swelling more and the skin is becoming darker which is a concern to her.   Patient Active Problem List   Diagnosis Date Noted  . Obesity (BMI 30-39.9) 07/01/2020  . Claudication ([place]) 06/22/2020  . Thoracic aortic aneurysm without rupture ([place]) 06/22/2020  . Generalized edema 06/03/2020  . History of COVID-19 06/03/2020  . Opioid dependence ([place]) 03/14/2020  . Pneumonia due to COVID-19 virus 03/14/2020  . Shortness of breath 03/14/2020  . Hernia of abdominal wall 07/10/2019  . Cough 09/09/2018  . Altered mental status   . Polysubstance abuse ([place])   . Overdose 05/04/2017  . Bilateral edema of lower extremity 07/29/2014  . History of colon cancer, stage II 07/29/2014  . Neuropathy 05/27/2014  . GERD (gastroesophageal reflux disease) 05/27/2014  . Cancer of right colon ([place]) 10/24/2012  . History of pancreatitis 09/19/2012  . Human immunodeficiency virus (HIV) disease ([place]) 09/19/2012  . Hepatitis C - treated 09/19/2012  . Tobacco abuse 09/19/2012  . Anemia 09/19/2012    Current Outpatient Medications on File Prior to Visit  Medication Sig Dispense Refill  . Darunavir-Cobicisctat-Emtricitabine-Tenofovir Alafenamide (SYMTUZA) 800-150-200-10 MG TABS TAKE 1 TABLET BY MOUTH DAILY WITH BREAKFAST. 30 tablet 5  . furosemide (LASIX) 40 MG tablet Take 1 tablet (40 mg total) by mouth daily. 90 tablet 3  . rosuvastatin (CRESTOR) 5 MG tablet Take 1 tablet (5 mg total) by mouth daily. 90 tablet 3   No current facility-administered medications on  file prior to visit.    No Known Allergies  Review of Systems No fevers, chills, nausea, muscle aches, no difficulty breathing, no calf pain, no chest pain or shortness of breath.   Physical Exam  Physical Exam   GENERAL APPEARANCE: Alert, conversant. Appropriately groomed. No acute distress.    VASCULAR: Pedal pulses palpable DP and PT bilateral.  Capillary refill time is immediate to all digits,  Proximal to distal cooling it warm to warm.  Digital perfusion adequate.    NEUROLOGIC: sensation is decrased epicritically and protectively to 5.07 monofilament at 0/5 sites bilateral.  Light touch is decreased bilateral, vibratory sensation diminished bilateral, achilles tendon reflex is intact bilateral.    MUSCULOSKELETAL: acceptable muscle strength, tone and stability bilateral.  Hammertoe contracture bilaterally noted. mildly Plantarflexed first and fifth metatarsals bilaterally noted.   No pain, crepitus or limitation noted with foot and ankle range of motion bilateral.    DERMATOLOGIC: skin is warm, supple, and dry.  No open lesions noted. Darkening of the skin of the anterior shins and ankles is noted.  edema is noted. Digital nails are thick, discolored, dystrophic, brittle with subungual debris present and clinically mycotic x 10.   Calluses present submet 1 left and submet 5 right foot.  Pain with direct pressure to nails is noted.  Intact dermis noted. No sign of infection seen    Assessment     ICD-10-CM   1. Pain due to onychomycosis of nail  B35.1 CANCELED: DG Foot Complete Right  M79.609 CANCELED: DG Foot Complete Left  2. Bilateral foot pain  M79.671    M79.672   3. Neuropathy  G62.9   4. Edema, unspecified type  R60.9      Plan Patient examined. Debridement of toenails was recommended.  Onychoreduction of symptomatic toenails was performed via nail nipper and power burr without iatrogenic incident.  Calluses also pared with a 15 blade without complication.  Patient was instructed on signs and symptoms of infection and was told to call immediately should any of these arise.   - we discussed a vascular consultation regarding the swelling in her legs.  I will make her a consultation at [facility] for testing and follow up if indicated.  - she will be seen back in 3 months or sooner if any problems or concerns arise.

## 2020-11-05 ENCOUNTER — Other Ambulatory Visit (HOSPITAL_COMMUNITY): Payer: Self-pay

## 2020-11-05 MED FILL — Darunavir-Cobic-Emtricitab-Tenofov AF Tab 800-150-200-10 MG: ORAL | 30 days supply | Qty: 30 | Fill #1 | Status: AC

## 2020-11-09 ENCOUNTER — Other Ambulatory Visit (HOSPITAL_COMMUNITY): Payer: Self-pay

## 2020-12-06 ENCOUNTER — Other Ambulatory Visit: Payer: Self-pay | Admitting: Internal Medicine

## 2020-12-06 ENCOUNTER — Other Ambulatory Visit (HOSPITAL_COMMUNITY): Payer: Self-pay

## 2020-12-06 MED ORDER — FUROSEMIDE 40 MG PO TABS
40.0000 mg | ORAL_TABLET | Freq: Every day | ORAL | 1 refills | Status: DC
  Filled 2020-12-06: qty 90, 90d supply, fill #0

## 2020-12-06 MED FILL — Darunavir-Cobic-Emtricitab-Tenofov AF Tab 800-150-200-10 MG: ORAL | 30 days supply | Qty: 30 | Fill #2 | Status: AC

## 2020-12-09 ENCOUNTER — Other Ambulatory Visit (HOSPITAL_COMMUNITY): Payer: Self-pay

## 2020-12-09 ENCOUNTER — Ambulatory Visit: Payer: Medicare HMO | Admitting: Internal Medicine

## 2020-12-14 ENCOUNTER — Ambulatory Visit: Payer: Medicare HMO

## 2021-01-03 ENCOUNTER — Other Ambulatory Visit (HOSPITAL_COMMUNITY): Payer: Self-pay

## 2021-01-05 ENCOUNTER — Other Ambulatory Visit (HOSPITAL_COMMUNITY): Payer: Self-pay

## 2021-01-07 ENCOUNTER — Other Ambulatory Visit: Payer: Self-pay | Admitting: Infectious Diseases

## 2021-01-07 ENCOUNTER — Other Ambulatory Visit (HOSPITAL_COMMUNITY): Payer: Self-pay

## 2021-01-07 DIAGNOSIS — Z79899 Other long term (current) drug therapy: Secondary | ICD-10-CM

## 2021-01-07 DIAGNOSIS — Z85038 Personal history of other malignant neoplasm of large intestine: Secondary | ICD-10-CM

## 2021-01-07 DIAGNOSIS — Z113 Encounter for screening for infections with a predominantly sexual mode of transmission: Secondary | ICD-10-CM

## 2021-01-07 DIAGNOSIS — K760 Fatty (change of) liver, not elsewhere classified: Secondary | ICD-10-CM

## 2021-01-07 DIAGNOSIS — B20 Human immunodeficiency virus [HIV] disease: Secondary | ICD-10-CM

## 2021-01-07 DIAGNOSIS — B182 Chronic viral hepatitis C: Secondary | ICD-10-CM

## 2021-01-14 ENCOUNTER — Ambulatory Visit: Payer: Medicare HMO | Admitting: Podiatry

## 2021-01-25 ENCOUNTER — Other Ambulatory Visit (HOSPITAL_COMMUNITY): Payer: Self-pay

## 2021-02-10 ENCOUNTER — Ambulatory Visit: Payer: Medicare HMO | Admitting: Podiatrist

## 2021-03-10 ENCOUNTER — Emergency Department (HOSPITAL_COMMUNITY): Payer: Medicare HMO

## 2021-03-10 ENCOUNTER — Other Ambulatory Visit: Payer: Self-pay

## 2021-03-10 ENCOUNTER — Encounter (HOSPITAL_COMMUNITY): Payer: Self-pay

## 2021-03-10 ENCOUNTER — Inpatient Hospital Stay (HOSPITAL_COMMUNITY)
Admission: EM | Admit: 2021-03-10 | Discharge: 2021-03-14 | DRG: 974 | Disposition: A | Discharge status: Home / Self Care | Payer: Medicare HMO | Attending: Internal Medicine | Admitting: Internal Medicine

## 2021-03-10 DIAGNOSIS — Z91128 Patient's intentional underdosing of medication regimen for other reason: Secondary | ICD-10-CM | POA: Diagnosis not present

## 2021-03-10 DIAGNOSIS — D696 Thrombocytopenia, unspecified: Secondary | ICD-10-CM | POA: Diagnosis not present

## 2021-03-10 DIAGNOSIS — D649 Anemia, unspecified: Secondary | ICD-10-CM | POA: Diagnosis not present

## 2021-03-10 DIAGNOSIS — J189 Pneumonia, unspecified organism: Secondary | ICD-10-CM | POA: Diagnosis not present

## 2021-03-10 DIAGNOSIS — Z720 Tobacco use: Secondary | ICD-10-CM | POA: Diagnosis not present

## 2021-03-10 DIAGNOSIS — F1120 Opioid dependence, uncomplicated: Secondary | ICD-10-CM | POA: Diagnosis present

## 2021-03-10 DIAGNOSIS — Z79899 Other long term (current) drug therapy: Secondary | ICD-10-CM

## 2021-03-10 DIAGNOSIS — Z20822 Contact with and (suspected) exposure to covid-19: Secondary | ICD-10-CM | POA: Diagnosis present

## 2021-03-10 DIAGNOSIS — B20 Human immunodeficiency virus [HIV] disease: Secondary | ICD-10-CM | POA: Diagnosis present

## 2021-03-10 DIAGNOSIS — R509 Fever, unspecified: Secondary | ICD-10-CM | POA: Diagnosis present

## 2021-03-10 DIAGNOSIS — T375X6A Underdosing of antiviral drugs, initial encounter: Secondary | ICD-10-CM | POA: Diagnosis present

## 2021-03-10 DIAGNOSIS — E875 Hyperkalemia: Secondary | ICD-10-CM | POA: Diagnosis present

## 2021-03-10 DIAGNOSIS — G894 Chronic pain syndrome: Secondary | ICD-10-CM | POA: Diagnosis present

## 2021-03-10 DIAGNOSIS — F1129 Opioid dependence with unspecified opioid-induced disorder: Secondary | ICD-10-CM | POA: Diagnosis not present

## 2021-03-10 DIAGNOSIS — Z809 Family history of malignant neoplasm, unspecified: Secondary | ICD-10-CM | POA: Diagnosis not present

## 2021-03-10 DIAGNOSIS — D6959 Other secondary thrombocytopenia: Secondary | ICD-10-CM | POA: Diagnosis present

## 2021-03-10 DIAGNOSIS — D638 Anemia in other chronic diseases classified elsewhere: Secondary | ICD-10-CM | POA: Diagnosis present

## 2021-03-10 DIAGNOSIS — J9601 Acute respiratory failure with hypoxia: Secondary | ICD-10-CM | POA: Diagnosis present

## 2021-03-10 DIAGNOSIS — R0602 Shortness of breath: Secondary | ICD-10-CM | POA: Diagnosis not present

## 2021-03-10 DIAGNOSIS — J188 Other pneumonia, unspecified organism: Secondary | ICD-10-CM

## 2021-03-10 DIAGNOSIS — I712 Thoracic aortic aneurysm, without rupture, unspecified: Secondary | ICD-10-CM | POA: Diagnosis present

## 2021-03-10 DIAGNOSIS — Z87891 Personal history of nicotine dependence: Secondary | ICD-10-CM

## 2021-03-10 DIAGNOSIS — Z85038 Personal history of other malignant neoplasm of large intestine: Secondary | ICD-10-CM

## 2021-03-10 DIAGNOSIS — R7989 Other specified abnormal findings of blood chemistry: Secondary | ICD-10-CM | POA: Diagnosis not present

## 2021-03-10 DIAGNOSIS — Z8616 Personal history of COVID-19: Secondary | ICD-10-CM | POA: Diagnosis not present

## 2021-03-10 DIAGNOSIS — J13 Pneumonia due to Streptococcus pneumoniae: Principal | ICD-10-CM

## 2021-03-10 DIAGNOSIS — K7469 Other cirrhosis of liver: Secondary | ICD-10-CM | POA: Diagnosis present

## 2021-03-10 DIAGNOSIS — E785 Hyperlipidemia, unspecified: Secondary | ICD-10-CM | POA: Diagnosis present

## 2021-03-10 DIAGNOSIS — Z8619 Personal history of other infectious and parasitic diseases: Secondary | ICD-10-CM

## 2021-03-10 DIAGNOSIS — Z9119 Patient's noncompliance with other medical treatment and regimen: Secondary | ICD-10-CM | POA: Diagnosis not present

## 2021-03-10 DIAGNOSIS — J449 Chronic obstructive pulmonary disease, unspecified: Secondary | ICD-10-CM | POA: Diagnosis present

## 2021-03-10 LAB — COMPREHENSIVE METABOLIC PANEL
ALT: 18 U/L (ref 0–44)
AST: 51 U/L — ABNORMAL HIGH (ref 15–41)
Albumin: 3.5 g/dL (ref 3.5–5.0)
Alkaline Phosphatase: 71 U/L (ref 38–126)
Anion gap: 7 (ref 5–15)
BUN: 11 mg/dL (ref 8–23)
CO2: 28 mmol/L (ref 22–32)
Calcium: 9.2 mg/dL (ref 8.9–10.3)
Chloride: 105 mmol/L (ref 98–111)
Creatinine, Ser: 0.83 mg/dL (ref 0.44–1.00)
GFR, Estimated: >60 mL/min (ref >60)
Glucose, Bld: 110 mg/dL — ABNORMAL HIGH (ref 70–99)
Potassium: 5.3 mmol/L — ABNORMAL HIGH (ref 3.5–5.1)
Sodium: 140 mmol/L (ref 135–145)
Total Bilirubin: 2.2 mg/dL — ABNORMAL HIGH (ref 0.3–1.2)
Total Protein: 8.4 g/dL — ABNORMAL HIGH (ref 6.5–8.1)

## 2021-03-10 LAB — CBC WITH DIFFERENTIAL/PLATELET
Abs Immature Granulocytes: 0.03 10*3/uL (ref 0.00–0.07)
Basophils Absolute: 0 10*3/uL (ref 0.0–0.1)
Basophils Relative: 0 %
Eosinophils Absolute: 0 10*3/uL (ref 0.0–0.5)
Eosinophils Relative: 0 %
HCT: 40.5 % (ref 36.0–46.0)
Hemoglobin: 13 g/dL (ref 12.0–15.0)
Immature Granulocytes: 0 %
Lymphocytes Relative: 9 %
Lymphs Abs: 0.8 10*3/uL (ref 0.7–4.0)
MCH: 32.3 pg (ref 26.0–34.0)
MCHC: 32.1 g/dL (ref 30.0–36.0)
MCV: 100.7 fL — ABNORMAL HIGH (ref 80.0–100.0)
Monocytes Absolute: 1.1 10*3/uL — ABNORMAL HIGH (ref 0.1–1.0)
Monocytes Relative: 13 %
Neutro Abs: 6.3 10*3/uL (ref 1.7–7.7)
Neutrophils Relative %: 78 %
Platelets: 87 10*3/uL — ABNORMAL LOW (ref 150–400)
RBC: 4.02 MIL/uL (ref 3.87–5.11)
RDW: 13.2 % (ref 11.5–15.5)
WBC: 8.2 10*3/uL (ref 4.0–10.5)
nRBC: 0 % (ref 0.0–0.2)

## 2021-03-10 LAB — URINALYSIS, ROUTINE W REFLEX MICROSCOPIC
Bilirubin Urine: NEGATIVE
Glucose, UA: NEGATIVE mg/dL
Ketones, ur: NEGATIVE mg/dL
Leukocytes,Ua: NEGATIVE
Nitrite: NEGATIVE
Protein, ur: NEGATIVE mg/dL
Specific Gravity, Urine: 1.009 (ref 1.005–1.030)
pH: 5 (ref 5.0–8.0)

## 2021-03-10 LAB — RAPID URINE DRUG SCREEN, HOSP PERFORMED
Amphetamines: NOT DETECTED
Barbiturates: NOT DETECTED
Benzodiazepines: NOT DETECTED
Cocaine: NOT DETECTED
Opiates: NOT DETECTED
Tetrahydrocannabinol: NOT DETECTED

## 2021-03-10 LAB — RESP PANEL BY RT-PCR (FLU A&B, COVID) ARPGX2
Influenza A by PCR: NEGATIVE
Influenza B by PCR: NEGATIVE
SARS Coronavirus 2 by RT PCR: NEGATIVE

## 2021-03-10 LAB — TROPONIN I (HIGH SENSITIVITY): Troponin I (High Sensitivity): 7 ng/L (ref <18)

## 2021-03-10 LAB — LACTIC ACID, PLASMA: Lactic Acid, Venous: 1.5 mmol/L (ref 0.5–1.9)

## 2021-03-10 LAB — LIPASE, BLOOD: Lipase: 30 U/L (ref 11–51)

## 2021-03-10 MED ORDER — SODIUM ZIRCONIUM CYCLOSILICATE 10 G PO PACK: 10.0000 g | PACK | Freq: Once | ORAL | Status: DC

## 2021-03-10 MED ORDER — ACETAMINOPHEN 325 MG PO TABS
650.0000 mg | ORAL_TABLET | Freq: Once | ORAL | Status: AC
  Administered 2021-03-10: 650 mg via ORAL
  Filled 2021-03-10: qty 2

## 2021-03-10 MED ORDER — SODIUM CHLORIDE 0.9 % IV BOLUS
1000.0000 mL | Freq: Once | INTRAVENOUS | Status: AC
Start: 2021-03-10 — End: 2021-03-10
  Administered 2021-03-10: 1000 mL via INTRAVENOUS

## 2021-03-10 MED ORDER — PREDNISONE 20 MG PO TABS
40.0000 mg | ORAL_TABLET | Freq: Two times a day (BID) | ORAL | Status: DC
  Administered 2021-03-10 – 2021-03-11 (×2): 40 mg via ORAL
  Filled 2021-03-10 (×2): qty 2

## 2021-03-10 MED ORDER — SULFAMETHOXAZOLE-TRIMETHOPRIM 400-80 MG/5ML IV SOLN: 320.0000 mg | Freq: Three times a day (TID) | INTRAVENOUS | Status: DC

## 2021-03-10 MED ORDER — SULFAMETHOXAZOLE-TRIMETHOPRIM 400-80 MG/5ML IV SOLN
320.0000 mg | INTRAVENOUS | Status: AC
  Administered 2021-03-11: 320 mg via INTRAVENOUS
  Filled 2021-03-10: qty 20

## 2021-03-10 MED ORDER — ONDANSETRON HCL 4 MG/2ML IJ SOLN
4.0000 mg | Freq: Once | INTRAMUSCULAR | Status: AC
Start: 2021-03-10 — End: 2021-03-10
  Administered 2021-03-10: 4 mg via INTRAVENOUS
  Filled 2021-03-10: qty 2

## 2021-03-10 MED ORDER — IOHEXOL 350 MG/ML SOLN
100.0000 mL | Freq: Once | INTRAVENOUS | Status: AC | PRN
  Administered 2021-03-10: 100 mL via INTRAVENOUS

## 2021-03-10 MED ORDER — SODIUM CHLORIDE 0.9 % IV SOLN
500.0000 mg | Freq: Once | INTRAVENOUS | Status: AC
  Administered 2021-03-11: 500 mg via INTRAVENOUS
  Filled 2021-03-10: qty 500

## 2021-03-10 MED ORDER — SODIUM CHLORIDE 0.9 % IV SOLN
1.0000 g | Freq: Once | INTRAVENOUS | Status: AC
  Administered 2021-03-10: 1 g via INTRAVENOUS
  Filled 2021-03-10: qty 10

## 2021-03-10 MED ORDER — FENTANYL CITRATE PF 50 MCG/ML IJ SOSY
25.0000 ug | PREFILLED_SYRINGE | Freq: Once | INTRAMUSCULAR | Status: AC
  Administered 2021-03-10: 25 ug via INTRAVENOUS
  Filled 2021-03-10: qty 1

## 2021-03-10 MED ORDER — MORPHINE SULFATE (PF) 4 MG/ML IV SOLN
4.0000 mg | Freq: Once | INTRAVENOUS | Status: AC
Start: 2021-03-10 — End: 2021-03-10
  Administered 2021-03-10: 4 mg via INTRAVENOUS
  Filled 2021-03-10: qty 1

## 2021-03-10 NOTE — ED Notes (Signed)
Provider is aware of inability to obtain IV access.

## 2021-03-10 NOTE — ED Notes (Signed)
Difficulty in obtaining IV access.  Will place IV team consult and update EDP.

## 2021-03-10 NOTE — ED Notes (Signed)
Pt requested to not change in a hospital gown due to being cold.

## 2021-03-10 NOTE — ED Provider Notes (Signed)
[facility] DEPT Provider Note   CSN: [id number] Arrival date & time: 03/10/21  1408    History Chief Complaint  Patient presents with   Fever   Chills   Cough   Nausea    Ruth Stephenson is a 70 y.o. female with past medical history significant for thoracic aortic aneurysm, HIV, substance use, prior colon cancer who presents for evaluation multiple complaints.  Since that patient was lost to follow-up with regards to oncology and infectious disease.  She was previously on Bactrim.  States she has been running low-grade fevers at home.  She has severe headache, chest pain, abdominal pain that goes into her back as well cough productive of green sputum.  She is nauseous and has had some vomiting.  No vision changes, paresthesias.  She feels generally weak.  No known COVID exposures.  She does not know what her CD4 count is.  Last bowel movement yesterday without any melena or bright blood per rectum.  Per patient on further evaluation last seen by ID in 2021, Stopped taking HIV meds June. Started taking again 2-3 days ago.  HPI     Past Medical History:  Diagnosis Date   Cancer ([place])    Hepatitis C    HIV (human immunodeficiency virus infection) ([place])     Patient Active Problem List   Diagnosis Date Noted   Obesity (BMI 30-39.9) 07/01/2020   Claudication ([place]) 06/22/2020   Thoracic aortic aneurysm without rupture ([place]) 06/22/2020   Generalized edema 06/03/2020   History of COVID-19 06/03/2020   Opioid dependence ([place]) 03/14/2020   Pneumonia due to COVID-19 virus 03/14/2020   Shortness of breath 03/14/2020   Hernia of abdominal wall 07/10/2019   Cough 09/09/2018   Altered mental status    Polysubstance abuse ([place])    Overdose 05/04/2017   Bilateral edema of lower extremity 07/29/2014   History of colon cancer, stage II 07/29/2014   Neuropathy 05/27/2014   GERD (gastroesophageal reflux disease) 05/27/2014   Cancer of right colon ([place])  10/24/2012   History of pancreatitis 09/19/2012   Human immunodeficiency virus (HIV) disease ([place]) 09/19/2012   Hepatitis C - treated 09/19/2012   Tobacco abuse 09/19/2012   Anemia 09/19/2012    Past Surgical History:  Procedure Laterality Date   CARPAL TUNNEL RELEASE Left 01/19/2014   Procedure: LEFT CARPAL TUNNEL RELEASE;  Surgeon: Schuyler Amor, MD;  Location: [facility];  Service: Orthopedics;  Laterality: Left;   COLON RESECTION N/A 09/23/2012   Procedure: LAPAROSCOPIC RIGHT COLON RESECTION   ;  Surgeon: Edward Jolly, MD;  Location: [facility];  Service: General;  Laterality: N/A;  LAPOROSCOPY, RIGHT HEMICOLECTOMY   COLON SURGERY  09/23/2012   lap right colectomy   COLONOSCOPY N/A 09/20/2012   Procedure: COLONOSCOPY;  Surgeon: Beryle Beams, MD;  Location: [facility];  Service: Endoscopy;  Laterality: N/A;   OPEN REDUCTION INTERNAL FIXATION (ORIF) DISTAL RADIAL FRACTURE Left 01/19/2014   Procedure: OPEN REDUCTION INTERNAL FIXATION (ORIF) LEFT  DISTAL RADIAL FRACTURE;  Surgeon: Schuyler Amor, MD;  Location: [facility];  Service: Orthopedics;  Laterality: Left;     OB History   No obstetric history on file.     Family History  Problem Relation Age of Onset   Cancer Mother     Social History   Tobacco Use   Smoking status: Former    Packs/day: 0.50    Years: 35.00    Pack years: 17.50  Types: Cigarettes   Smokeless tobacco: Never   Tobacco comments:    trying to cut back; 4-5 cigarettes per day   Vaping Use   Vaping Use: Never used  Substance Use Topics   Alcohol use: Not Currently    Comment: Occasionally; none currently as of 01/28/18   Drug use: Not Currently    Types: Cocaine, Marijuana    Comment: Heroin; in outpatient drug [facility] (reported on 01/28/18 visit     Home Medications Prior to Admission medications   Medication Sig Start Date End Date Taking? Authorizing Provider   Darunavir-Cobicisctat-Emtricitabine-Tenofovir Alafenamide (SYMTUZA) 800-150-200-10 MG TABS TAKE 1 TABLET BY MOUTH DAILY WITH BREAKFAST. 07/07/20 07/07/21 Yes Campbell Riches, MD  furosemide (LASIX) 40 MG tablet Take 1 tablet (40 mg total) by mouth daily. 12/06/20  Yes Chandrasekhar, Mahesh A, MD  methadone (DOLOPHINE) 10 MG/5ML solution Take 30 mg by mouth daily.    [provider]  rosuvastatin (CRESTOR) 5 MG tablet Take 1 tablet (5 mg total) by mouth daily. 06/22/20 09/20/20  Werner Lean, MD    Allergies    Patient has no known allergies.  Review of Systems   Review of Systems  Constitutional:  Positive for activity change, appetite change, chills, fatigue and fever (subjective).  Respiratory:  Positive for cough, chest tightness and shortness of breath. Negative for apnea, choking, wheezing and stridor.   Cardiovascular:  Positive for chest pain. Negative for palpitations and leg swelling.  Gastrointestinal:  Positive for abdominal pain, nausea and vomiting. Negative for constipation, diarrhea and rectal pain.  Genitourinary: Negative.   Musculoskeletal:  Positive for back pain and myalgias. Negative for neck pain and neck stiffness.  Skin: Negative.   Neurological:  Positive for weakness (Generalized).  All other systems reviewed and are negative.  Physical Exam Updated Vital Signs BP 100/63   Pulse (!) 52   Temp (!) 100.7 F (38.2 C) (Oral)   Resp 16   Ht '5\' 3"'$  (1.6 m)   Wt 61.2 kg   SpO2 96%   BMI 23.91 kg/m   Physical Exam Vitals and nursing note reviewed.  Constitutional:      General: She is not in acute distress.    Appearance: She is well-developed. She is not ill-appearing, toxic-appearing or diaphoretic.  HENT:     Head: Normocephalic and atraumatic.     Nose: Nose normal.     Mouth/Throat:     Mouth: Mucous membranes are moist.  Eyes:     Pupils: Pupils are equal, round, and reactive to light.  Cardiovascular:     Rate and Rhythm:  Normal rate.     Pulses: Normal pulses.     Heart sounds: Normal heart sounds.  Pulmonary:     Effort: Pulmonary effort is normal. No respiratory distress.     Breath sounds: Normal breath sounds.     Comments: Speaks in full sentences without difficulty Abdominal:     General: Bowel sounds are normal. There is no distension.     Palpations: Abdomen is soft.     Tenderness: There is abdominal tenderness. There is no right CVA tenderness, left CVA tenderness, guarding or rebound.     Comments: Soft, generalized tenderness, no rebound or guarding  Musculoskeletal:        General: Normal range of motion.     Cervical back: Normal range of motion.     Comments: No bony tenderness.  Compartments soft.  Moves all 4 extremities without difficulty.  Diffuse tenderness to  any palpation posterior back  Skin:    General: Skin is warm and dry.     Capillary Refill: Capillary refill takes less than 2 seconds.     Comments: No rash or lesions  Neurological:     General: No focal deficit present.     Mental Status: She is alert.     Cranial Nerves: Cranial nerves are intact.     Sensory: Sensation is intact.     Motor: Motor function is intact.     Comments: CN 2-12 grossly intact Equal strength Intact sensation  Psychiatric:        Mood and Affect: Mood normal.   ED Results / Procedures / Treatments   Labs (all labs ordered are listed, but only abnormal results are displayed) Labs Reviewed  COMPREHENSIVE METABOLIC PANEL - Abnormal; Notable for the following components:      Result Value   Potassium 5.3 (*)    Glucose, Bld 110 (*)    Total Protein 8.4 (*)    AST 51 (*)    Total Bilirubin 2.2 (*)    All other components within normal limits  CBC WITH DIFFERENTIAL/PLATELET - Abnormal; Notable for the following components:   MCV 100.7 (*)    Platelets 87 (*)    Monocytes Absolute 1.1 (*)    All other components within normal limits  URINALYSIS, ROUTINE W REFLEX MICROSCOPIC - Abnormal;  Notable for the following components:   Hgb urine dipstick MODERATE (*)    Bacteria, UA FEW (*)    All other components within normal limits  RESP PANEL BY RT-PCR (FLU A&B, COVID) ARPGX2  CULTURE, BLOOD (ROUTINE X 2)  CULTURE, BLOOD (ROUTINE X 2)  LIPASE, BLOOD  LACTIC ACID, PLASMA  RAPID URINE DRUG SCREEN, HOSP PERFORMED  LACTIC ACID, PLASMA  T-HELPER CELLS (CD4) COUNT (NOT AT [facility])  TROPONIN I (HIGH SENSITIVITY)  TROPONIN I (HIGH SENSITIVITY)    EKG None  Radiology CT HEAD WO CONTRAST (5MM)  Result Date: 03/10/2021 CLINICAL DATA:  Headache body aches EXAM: CT HEAD WITHOUT CONTRAST TECHNIQUE: Contiguous axial images were obtained from the base of the skull through the vertex without intravenous contrast. COMPARISON:  CT brain 05/04/2017 FINDINGS: Brain: No evidence of acute infarction, hemorrhage, hydrocephalus, extra-axial collection or mass lesion/mass effect. Vascular: No hyperdense vessel or unexpected calcification. Carotid vascular calcification Skull: Normal. Negative for fracture or focal lesion. Sinuses/Orbits: Patchy mucosal thickening in the sinuses Other: None IMPRESSION: Negative non contrasted CT appearance of the brain Electronically Signed   By: Donavan Foil M.D.   On: 03/10/2021 20:40   DG Chest Portable 1 View  Result Date: 03/10/2021 CLINICAL DATA:  Shortness of breath with cough EXAM: PORTABLE CHEST 1 VIEW COMPARISON:  06/04/2020, chest CT 01/23/2018, chest x-ray 09/09/2018 FINDINGS: Emphysematous disease. Mild diffuse reticular opacity likely due to chronic disease. No acute confluent airspace disease or pleural effusion. Stable cardiomediastinal silhouette with aortic atherosclerosis. IMPRESSION: No active disease.  Emphysema Electronically Signed   By: Donavan Foil M.D.   On: 03/10/2021 17:46   CT Angio Chest/Abd/Pel for Dissection W and/or W/WO  Result Date: 03/10/2021 CLINICAL DATA:  Body aches, cough, nausea for 5 days, fever, history of thoracic aortic  aneurysm, abdominal and back pain EXAM: CT ANGIOGRAPHY CHEST, ABDOMEN AND PELVIS TECHNIQUE: Non-contrast CT of the chest was initially obtained. Multidetector CT imaging through the chest, abdomen and pelvis was performed using the standard protocol during bolus administration of intravenous contrast. Multiplanar reconstructed images and MIPs were obtained and  reviewed to evaluate the vascular anatomy. CONTRAST:  124m OMNIPAQUE IOHEXOL 350 MG/ML SOLN COMPARISON:  01/23/2018, 03/10/2021 FINDINGS: CTA CHEST FINDINGS Cardiovascular: No evidence of thoracic aortic aneurysm or dissection. Minimal atherosclerosis of the aortic arch. Heart is unremarkable without pericardial effusion. Mediastinum/Nodes: Subcentimeter lymph nodes are seen within the subcarinal region and right hilum, likely reactive. Thyroid, trachea, and esophagus are unremarkable. Lungs/Pleura: There is patchy airspace disease within the right upper and right lower lobes consistent with bronchopneumonia. No effusion or pneumothorax. Chronic left upper lobe scarring and background emphysema. Central airways are patent. Musculoskeletal: No acute or destructive bony lesions. Reconstructed images demonstrate no additional findings. Review of the MIP images confirms the above findings. CTA ABDOMEN AND PELVIS FINDINGS VASCULAR Aorta: Normal caliber aorta without aneurysm, dissection, vasculitis or significant stenosis. Mild diffuse atherosclerosis. Celiac: Patent without evidence of aneurysm, dissection, vasculitis or significant stenosis. Mild atherosclerosis at the origin without significant stenosis. SMA: Patent without evidence of aneurysm, dissection, vasculitis or significant stenosis. Renals: Both renal arteries are patent without evidence of aneurysm, dissection, vasculitis, fibromuscular dysplasia or significant stenosis. There is an accessory right renal artery supplying the lower pole which is widely patent. IMA: Patent without evidence of aneurysm,  dissection, vasculitis or significant stenosis. Inflow: Patent without evidence of aneurysm, dissection, vasculitis or significant stenosis. Veins: No obvious venous abnormality within the limitations of this arterial phase study. Review of the MIP images confirms the above findings. NON-VASCULAR Hepatobiliary: Diffuse nodularity of the liver capsule compatible with cirrhosis. No focal parenchymal abnormality. The gallbladder is unremarkable. Pancreas: Unremarkable. No pancreatic ductal dilatation or surrounding inflammatory changes. Spleen: The spleen is enlarged measuring 13.5 x 13.8 x 7.2 cm. No focal abnormalities. Adrenals/Urinary Tract: Kidneys enhance normally and symmetrically. No urinary tract calculi or obstructive uropathy. The adrenals and bladder are unremarkable. Stomach/Bowel: No bowel obstruction or ileus. Postsurgical changes from partial right hemicolectomy and reanastomosis. No bowel wall thickening or inflammatory change. Lymphatic: No pathologic adenopathy. Reproductive: Uterus and bilateral adnexa are unremarkable. Other: No free fluid or free gas. Small fat containing supraumbilical ventral hernia. No bowel herniation. Musculoskeletal: No acute or destructive bony lesions. Reconstructed images demonstrate no additional findings. Review of the MIP images confirms the above findings. IMPRESSION: 1. No evidence of thoracoabdominal aortic aneurysm or dissection. 2. Multifocal right-sided bronchopneumonia. 3. 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 4. Small fat containing supraumbilical ventral hernia. 5. Aortic Atherosclerosis (ICD10-I70.0) and Emphysema (ICD10-J43.9). Electronically Signed   By: MRanda NgoM.D.   On: 03/10/2021 20:47    Procedures .Critical Care Performed by: HNettie Elm PA-C Authorized by: HNettie Elm PA-C   Critical care provider statement:    Critical care time (minutes):  45   Critical care was necessary to treat or  prevent imminent or life-threatening deterioration of the following conditions:  Respiratory failure and metabolic crisis   Critical care was time spent personally by me on the following activities:  Discussions with consultants, evaluation of patient's response to treatment, examination of patient, ordering and performing treatments and interventions, ordering and review of laboratory studies, ordering and review of radiographic studies, pulse oximetry, re-evaluation of patient's condition, obtaining history from patient or surrogate and review of old charts   Medications Ordered in ED Medications  fentaNYL (SUBLIMAZE) injection 25 mcg (has no administration in time range)  cefTRIAXone (ROCEPHIN) 1 g in sodium chloride 0.9 % 100 mL IVPB (has no administration in time range)  azithromycin (ZITHROMAX) 500 mg in sodium chloride 0.9 %  250 mL IVPB (has no administration in time range)  predniSONE (DELTASONE) tablet 40 mg (has no administration in time range)  sodium chloride 0.9 % bolus 1,000 mL (0 mLs Intravenous Stopped 03/10/21 1940)  ondansetron (ZOFRAN) injection 4 mg (4 mg Intravenous Given 03/10/21 1830)  morphine 4 MG/ML injection 4 mg (4 mg Intravenous Given 03/10/21 1830)  acetaminophen (TYLENOL) tablet 650 mg (650 mg Oral Given 03/10/21 1751)  iohexol (OMNIPAQUE) 350 MG/ML injection 100 mL (100 mLs Intravenous Contrast Given 03/10/21 2027)  sodium chloride 0.9 % bolus 1,000 mL (1,000 mLs Intravenous New Bag/Given 03/10/21 2126)   ED Course  I have reviewed the triage vital signs and the nursing notes.  Pertinent labs & imaging results that were available during my care of the patient were reviewed by me and considered in my medical decision making (see chart for details).  Here for evaluation of multiple complaints. Afebrile, non septic. Appears to not feel well. Diffuse myalgias as well as HA, CP, abd pain, back pain. Known thoracic aneurysm and HIV, seen to have been lost to FU. Supposed to  be on Bactrim. Plan on broad WU given immunocompromised state and hx.  Labs and imaging personally reviewed and interpreted:  CBC no leukocytosis, hemoglobin 13.0 CMP mild hyperkalemia 5.3, AST elevated at 51, T bili 2.2, up from prior, no known history of cirrhosis.  No peaked T waves with hyperkalemia Lipase 30 Trop 7 Lactic 1.7 UDS negative UA blood, no infection COVID, FLU neg DG chest without significant abnormality CT head without significant abnormality CTA C/A/P right-sided multifocal bronchopneumonia  Patient reassessed.  Apparently with nursing patient had episode of hypoxia into the high 80s.  Subsequently placed on 2 L via nasal cannula.  I discussed patient's labs and imaging.  High suspicion for symptomatic HIV, given has not been on medications over the last few months, prior low CD4 count.  We will plan on touching base with infectious disease, for antibiotic recommendations as well as consult with medicine for admission.  CONSULT with Dr. Juleen China with ID, recommends normal CAP coverage, Bactrim per pharm as well as prednisone 40 mg twice daily, will see patient in morning  CONSULT with Dr. Roel Cluck with [facility] who agrees to patient for admission  The patient appears reasonably stabilized for admission considering the current resources, flow, and capabilities available in the ED at this time, and I doubt any other [facility] requiring further screening and/or treatment in the ED prior to admission.     MDM Rules/Calculators/A&P                            Final Clinical Impression(s) / ED Diagnoses Final diagnoses:  Elevated LFTs  Symptomatic HIV infection ([place])  Multifocal pneumonia  Acute respiratory failure with hypoxia [facility])    Rx / DC Orders ED Discharge Orders     None        Jaquavian Firkus A, PA-C 03/10/21 2314    Godfrey Pick, MD 03/11/21 1511

## 2021-03-10 NOTE — ED Triage Notes (Addendum)
Patient c/o body aches, cough, nausea x 5 days and fever today.

## 2021-03-10 NOTE — H&P (Signed)
Ruth Stephenson [id number] DOB: 11/15/1950 DOA: 03/10/2021     PCP: System, Provider Not In   [facility]  ID dr. Johnnye Sima  Patient arrived to ER on 03/10/21 at 1408 Referred by Attending Godfrey Pick, MD   Patient coming from: home Lives With family    Chief Complaint:   Chief Complaint  Patient presents with   Fever   Chills   Cough   Nausea    HPI: Ruth Stephenson is a 70 y.o. female with medical history significant of HIV  thoracic aortic aneurysm, substance use, prior colon cancer , hep C  Presented with   5 d of cough nausea body aches Low grade fever Lost ot folow up have not seen Dr. Shelton Silvas since 2021 Off her meds for 6 months Only started to take bactrim and HIV meds only for few day Reports she no longer smokes or drinks Since Covid have had some leg swelling and been taking Lasix  Recently was exposed to sick child with multiple family members getting sick   Has  been vaccinated against COVID     Initial COVID TEST  NEGATIVE   Lab Results  Component Value Date   [place] 03/10/2021   [place] (A) 03/14/2020     Regarding pertinent Chronic problems:     Hyperlipidemia -  on statins crestor Lipid Panel     Component Value Date/Time   CHOL 163 12/30/2019 1523   TRIG 77 03/14/2020 1849   HDL 59 12/30/2019 1523   CHOLHDL 2.8 12/30/2019 1523   VLDL 14 04/12/2016 1600   LDLCALC 88 12/30/2019 1523      While in ER: Hypoxic down to 88% Pred 40 BID     ED Triage Vitals  Enc Vitals Group     BP 03/10/21 1424 130/76     Pulse Rate 03/10/21 1424 83     Resp 03/10/21 1424 (!) 22     Temp 03/10/21 1424 99.1 F (37.3 C)     Temp Source 03/10/21 1424 Oral     SpO2 03/10/21 1424 91 %     Weight 03/10/21 1437 135 lb (61.2 kg)     Height 03/10/21 1437 '5\' 3"'$  (1.6 m)     Head Circumference --      Peak Flow --      Pain Score 03/10/21 1437 8     Pain Loc --      Pain Edu? --      Excl. in [place]? --    TMAX(24)@     _________________________________________ Significant initial  Findings: Abnormal Labs Reviewed  COMPREHENSIVE METABOLIC PANEL - Abnormal; Notable for the following components:      Result Value   Potassium 5.3 (*)    Glucose, Bld 110 (*)    Total Protein 8.4 (*)    AST 51 (*)    Total Bilirubin 2.2 (*)    All other components within normal limits  CBC WITH DIFFERENTIAL/PLATELET - Abnormal; Notable for the following components:   MCV 100.7 (*)    Platelets 87 (*)    Monocytes Absolute 1.1 (*)    All other components within normal limits  URINALYSIS, ROUTINE W REFLEX MICROSCOPIC - Abnormal; Notable for the following components:   Hgb urine dipstick MODERATE (*)    Bacteria, UA FEW (*)    All other components within normal limits   ____________________________________________ Ordered CT HEAD   NON acute  CXR - emphysema    CTA chest - .  Multifocal right-sided bronchopneumonia. 3. 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 _________________________ Troponin 7 ECG: Ordered Personally reviewed by me showing: HR : 72 Rhythm:  NSR,     no evidence of ischemic changes QTC 458  The recent clinical data is shown below. Vitals:   03/10/21 2000 03/10/21 2121 03/10/21 2130 03/10/21 2200  BP: 96/60 98/61 100/63 114/69  Pulse: (!) 59 (!) 51 (!) 52 (!) 57  Resp: '17 16 16 14  '$ Temp:      TempSrc:      SpO2: 95% 96% 96% 97%  Weight:      Height:         WBC     Component Value Date/Time   WBC 8.2 03/10/2021 1826   LYMPHSABS 0.8 03/10/2021 1826   MONOABS 1.1 (H) 03/10/2021 1826   EOSABS 0.0 03/10/2021 1826   BASOSABS 0.0 03/10/2021 1826     Lactic Acid, Venous    Component Value Date/Time   LATICACIDVEN 1.5 03/10/2021 1826      UA  no evidence of UTI     Urine analysis:    Component Value Date/Time   COLORURINE YELLOW 03/10/2021 1708   APPEARANCEUR CLEAR 03/10/2021 1708   LABSPEC 1.009 03/10/2021 1708   PHURINE 5.0  03/10/2021 1708   GLUCOSEU NEGATIVE 03/10/2021 1708   HGBUR MODERATE (A) 03/10/2021 1708   BILIRUBINUR NEGATIVE 03/10/2021 1708   KETONESUR NEGATIVE 03/10/2021 1708   PROTEINUR NEGATIVE 03/10/2021 1708   UROBILINOGEN 1.0 03/25/2015 2034   NITRITE NEGATIVE 03/10/2021 1708   LEUKOCYTESUR NEGATIVE 03/10/2021 1708    Results for orders placed or performed during the hospital encounter of 03/10/21  Resp Panel by RT-PCR (Flu A&B, Covid) Nasopharyngeal Swab     Status: None   Collection Time: 03/10/21  5:48 PM   Specimen: Nasopharyngeal Swab; Nasopharyngeal(NP) swabs in vial transport medium  Result Value Ref Range Status   SARS Coronavirus 2 by RT PCR NEGATIVE NEGATIVE Final         Influenza A by PCR NEGATIVE NEGATIVE Final   Influenza B by PCR NEGATIVE NEGATIVE Final           ER Provider Called:  ID     They Recommend admit to medicine  start on bactrim and steroids and CAP cpver Will see in AM    _______________________________________________ Hospitalist was called for admission for CAP  The following Work up has been ordered so far:  Orders Placed This Encounter  Procedures   Critical Care   Blood culture (routine x 2)   Resp Panel by RT-PCR (Flu A&B, Covid) Nasopharyngeal Swab   CT HEAD WO CONTRAST (5MM)   CT Angio Chest/Abd/Pel for Dissection W and/or W/WO   DG Chest Portable 1 View   US Abdomen Limited RUQ (LIVER/GB)   Comprehensive metabolic panel   Lipase, blood   CBC with Differential   Urinalysis, Routine w reflex microscopic   Lactic acid, plasma   Rapid urine drug screen (hospital performed)   T-helper cells (CD4) count (not at [facility])   Consult to infectious diseases   sulfamethoxazole-trimethoprim (SEPTRA) per pharma   Consult to hospitalist   Airborne and Contact precautions   ED EKG     Following Medications were ordered in ER: Medications  cefTRIAXone (ROCEPHIN) 1 g in sodium chloride 0.9 % 100 mL IVPB (has no administration in time range)   azithromycin (ZITHROMAX) 500 mg in sodium chloride 0.9 % 250 mL IVPB (has no administration in time range)  predniSONE (DELTASONE) tablet 40 mg (has no administration in time range)  sulfamethoxazole-trimethoprim (BACTRIM) 320 mg in dextrose 5 % 500 mL IVPB (has no administration in time range)  sulfamethoxazole-trimethoprim (BACTRIM) 320 mg in dextrose 5 % 500 mL IVPB (has no administration in time range)  sodium chloride 0.9 % bolus 1,000 mL (0 mLs Intravenous Stopped 03/10/21 1940)  ondansetron (ZOFRAN) injection 4 mg (4 mg Intravenous Given 03/10/21 1830)  morphine 4 MG/ML injection 4 mg (4 mg Intravenous Given 03/10/21 1830)  acetaminophen (TYLENOL) tablet 650 mg (650 mg Oral Given 03/10/21 1751)  iohexol (OMNIPAQUE) 350 MG/ML injection 100 mL (100 mLs Intravenous Contrast Given 03/10/21 2027)  sodium chloride 0.9 % bolus 1,000 mL (1,000 mLs Intravenous New Bag/Given 03/10/21 2126)  fentaNYL (SUBLIMAZE) injection 25 mcg (25 mcg Intravenous Given 03/10/21 2153)        Consult Orders  (From admission, onward)           Start     Ordered   03/10/21 2152  Consult to hospitalist  Once       Provider:  (Not yet assigned)  Question Answer Comment  Place call to: [facility]   Reason for Consult Admit      03/10/21 2151              OTHER Significant initial  Findings:  labs showing:    Recent Labs  Lab 03/10/21 1826  NA 140  K 5.3*  CO2 28  GLUCOSE 110*  BUN 11  CREATININE 0.83  CALCIUM 9.2    Cr   stable,   Lab Results  Component Value Date   CREATININE 0.83 03/10/2021   CREATININE 0.77 06/30/2020   CREATININE 0.59 06/04/2020    Recent Labs  Lab 03/10/21 1826  AST 51*  ALT 18  ALKPHOS 71  BILITOT 2.2*  PROT 8.4*  ALBUMIN 3.5   Lab Results  Component Value Date   CALCIUM 9.2 03/10/2021   PHOS 3.2 03/17/2020          Plt: Lab Results  Component Value Date   PLT 87 (L) 03/10/2021      COVID-19 Labs  No results for input(s): DDIMER,  FERRITIN, LDH, CRP in the last 72 hours.  Lab Results  Component Value Date   SARSCOV2NAA NEGATIVE 03/10/2021   SARSCOV2NAA POSITIVE (A) 03/14/2020    Recent Labs  Lab 03/10/21 1826  WBC 8.2  NEUTROABS 6.3  HGB 13.0  HCT 40.5  MCV 100.7*  PLT 87*    HG/HCT   stable     Component Value Date/Time   HGB 13.0 03/10/2021 1826   HGB 13.9 06/04/2020 1454   HCT 40.5 03/10/2021 1826   HCT 39.9 06/04/2020 1454   MCV 100.7 (H) 03/10/2021 1826   MCV 96 06/04/2020 1454     Recent Labs  Lab 03/10/21 1826  LIPASE 30   No results for input(s): AMMONIA in the last 168 hours.   Cardiac Panel (last 3 results) Recent Labs    03/11/21 0007  CKTOTAL 50     BNP (last 3 results) Recent Labs    06/04/20 1454  BNP 107.0*      DM  labs:  HbA1C: Recent Labs    03/16/20 0806  HGBA1C 5.8*       CBG (last 3)  No results for input(s): GLUCAP in the last 72 hours.        Cultures:    Component Value Date/Time   SDES  03/14/2020 1929  BLOOD LEFT ARM Performed at [facility], [place] [address], [place], [place] [address]    [place]  03/14/2020 1929    BOTTLES DRAWN AEROBIC AND ANAEROBIC Blood Culture results may not be optimal due to an inadequate volume of blood received in culture bottles Performed at [facility], [place] [address] [place], [place] [address]    CULT  03/14/2020 1929    NO GROWTH 5 DAYS Performed at [facility] [address] [place], [place] [address]    REPTSTATUS 03/20/2020 FINAL 03/14/2020 1929     Radiological Exams on Admission: CT HEAD WO CONTRAST (5MM)  Result Date: 03/10/2021 CLINICAL DATA:  Headache body aches EXAM: CT HEAD WITHOUT CONTRAST TECHNIQUE: Contiguous axial images were obtained from the base of the skull through the vertex without intravenous contrast. COMPARISON:  CT brain 05/04/2017 FINDINGS: Brain: No evidence of acute infarction, hemorrhage, hydrocephalus, extra-axial  collection or mass lesion/mass effect. Vascular: No hyperdense vessel or unexpected calcification. Carotid vascular calcification Skull: Normal. Negative for fracture or focal lesion. Sinuses/Orbits: Patchy mucosal thickening in the sinuses Other: None IMPRESSION: Negative non contrasted CT appearance of the brain Electronically Signed   By: Donavan Foil M.D.   On: 03/10/2021 20:40   DG Chest Portable 1 View  Result Date: 03/10/2021 CLINICAL DATA:  Shortness of breath with cough EXAM: PORTABLE CHEST 1 VIEW COMPARISON:  06/04/2020, chest CT 01/23/2018, chest x-ray 09/09/2018 FINDINGS: Emphysematous disease. Mild diffuse reticular opacity likely due to chronic disease. No acute confluent airspace disease or pleural effusion. Stable cardiomediastinal silhouette with aortic atherosclerosis. IMPRESSION: No active disease.  Emphysema Electronically Signed   By: Donavan Foil M.D.   On: 03/10/2021 17:46   CT Angio Chest/Abd/Pel for Dissection W and/or W/WO  Result Date: 03/10/2021 CLINICAL DATA:  Body aches, cough, nausea for 5 days, fever, history of thoracic aortic aneurysm, abdominal and back pain EXAM: CT ANGIOGRAPHY CHEST, ABDOMEN AND PELVIS TECHNIQUE: Non-contrast CT of the chest was initially obtained. Multidetector CT imaging through the chest, abdomen and pelvis was performed using the standard protocol during bolus administration of intravenous contrast. Multiplanar reconstructed images and MIPs were obtained and reviewed to evaluate the vascular anatomy. CONTRAST:  123m OMNIPAQUE IOHEXOL 350 MG/ML SOLN COMPARISON:  01/23/2018, 03/10/2021 FINDINGS: CTA CHEST FINDINGS Cardiovascular: No evidence of thoracic aortic aneurysm or dissection. Minimal atherosclerosis of the aortic arch. Heart is unremarkable without pericardial effusion. Mediastinum/Nodes: Subcentimeter lymph nodes are seen within the subcarinal region and right hilum, likely reactive. Thyroid, trachea, and esophagus are unremarkable.  Lungs/Pleura: There is patchy airspace disease within the right upper and right lower lobes consistent with bronchopneumonia. No effusion or pneumothorax. Chronic left upper lobe scarring and background emphysema. Central airways are patent. Musculoskeletal: No acute or destructive bony lesions. Reconstructed images demonstrate no additional findings. Review of the MIP images confirms the above findings. CTA ABDOMEN AND PELVIS FINDINGS VASCULAR Aorta: Normal caliber aorta without aneurysm, dissection, vasculitis or significant stenosis. Mild diffuse atherosclerosis. Celiac: Patent without evidence of aneurysm, dissection, vasculitis or significant stenosis. Mild atherosclerosis at the origin without significant stenosis. SMA: Patent without evidence of aneurysm, dissection, vasculitis or significant stenosis. Renals: Both renal arteries are patent without evidence of aneurysm, dissection, vasculitis, fibromuscular dysplasia or significant stenosis. There is an accessory right renal artery supplying the lower pole which is widely patent. IMA: Patent without evidence of aneurysm, dissection, vasculitis or significant stenosis. Inflow: Patent without evidence of aneurysm, dissection, vasculitis or significant stenosis. Veins: No obvious venous  abnormality within the limitations of this arterial phase study. Review of the MIP images confirms the above findings. NON-VASCULAR Hepatobiliary: Diffuse nodularity of the liver capsule compatible with cirrhosis. No focal parenchymal abnormality. The gallbladder is unremarkable. Pancreas: Unremarkable. No pancreatic ductal dilatation or surrounding inflammatory changes. Spleen: The spleen is enlarged measuring 13.5 x 13.8 x 7.2 cm. No focal abnormalities. Adrenals/Urinary Tract: Kidneys enhance normally and symmetrically. No urinary tract calculi or obstructive uropathy. The adrenals and bladder are unremarkable. Stomach/Bowel: No bowel obstruction or ileus. Postsurgical changes  from partial right hemicolectomy and reanastomosis. No bowel wall thickening or inflammatory change. Lymphatic: No pathologic adenopathy. Reproductive: Uterus and bilateral adnexa are unremarkable. Other: No free fluid or free gas. Small fat containing supraumbilical ventral hernia. No bowel herniation. Musculoskeletal: No acute or destructive bony lesions. Reconstructed images demonstrate no additional findings. Review of the MIP images confirms the above findings. IMPRESSION: 1. No evidence of thoracoabdominal aortic aneurysm or dissection. 2. Multifocal right-sided bronchopneumonia. 3. 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 4. Small fat containing supraumbilical ventral hernia. 5. Aortic Atherosclerosis (ICD10-I70.0) and Emphysema (ICD10-J43.9). Electronically Signed   By: Randa Ngo M.D.   On: 03/10/2021 20:47   _______________________________________________________________________________________________________ Latest  Blood pressure 114/69, pulse (!) 57, temperature (!) 100.7 F (38.2 C), temperature source Oral, resp. rate 14, height '5\' 3"'$  (1.6 m), weight 61.2 kg, SpO2 97 %.   Review of Systems:    Pertinent positives include:  Fevers, chills, fatigue,  shortness of breath at rest.  dyspnea on exertion, non-productive cough, Constitutional:  No weight loss, night sweats,  weight loss  HEENT:  No headaches, Difficulty swallowing,Tooth/dental problems,Sore throat,  No sneezing, itching, ear ache, nasal congestion, post nasal drip,  Cardio-vascular:  No chest pain, Orthopnea, PND, anasarca, dizziness, palpitations.no Bilateral lower extremity swelling  GI:  No heartburn, indigestion, abdominal pain, nausea, vomiting, diarrhea, change in bowel habits, loss of appetite, melena, blood in stool, hematemesis Resp:  no No excess mucus, no productive cough, No No coughing up of blood.No change in color of mucus.No wheezing. Skin:  no rash or lesions. No  jaundice GU:  no dysuria, change in color of urine, no urgency or frequency. No straining to urinate.  No flank pain.  Musculoskeletal:  No joint pain or no joint swelling. No decreased range of motion. No back pain.  Psych:  No change in mood or affect. No depression or anxiety. No memory loss.  Neuro: no localizing neurological complaints, no tingling, no weakness, no double vision, no gait abnormality, no slurred speech, no confusion  All systems reviewed and apart from [place] all are negative _______________________________________________________________________________________________ Past Medical History:   Past Medical History:  Diagnosis Date   Cancer ([place])    Hepatitis C    HIV (human immunodeficiency virus infection) ([place])       Past Surgical History:  Procedure Laterality Date   CARPAL TUNNEL RELEASE Left 01/19/2014   Procedure: LEFT CARPAL TUNNEL RELEASE;  Surgeon: Schuyler Amor, MD;  Location: [facility];  Service: Orthopedics;  Laterality: Left;   COLON RESECTION N/A 09/23/2012   Procedure: LAPAROSCOPIC RIGHT COLON RESECTION   ;  Surgeon: Edward Jolly, MD;  Location: [facility];  Service: General;  Laterality: N/A;  LAPOROSCOPY, RIGHT HEMICOLECTOMY   COLON SURGERY  09/23/2012   lap right colectomy   COLONOSCOPY N/A 09/20/2012   Procedure: COLONOSCOPY;  Surgeon: Beryle Beams, MD;  Location: [facility];  Service: Endoscopy;  Laterality: N/A;  OPEN REDUCTION INTERNAL FIXATION (ORIF) DISTAL RADIAL FRACTURE Left 01/19/2014   Procedure: OPEN REDUCTION INTERNAL FIXATION (ORIF) LEFT  DISTAL RADIAL FRACTURE;  Surgeon: Schuyler Amor, MD;  Location: [facility];  Service: Orthopedics;  Laterality: Left;    Social History:  Ambulatory  cane,      reports that she has quit smoking. Her smoking use included cigarettes. She has a 17.50 pack-year smoking history. She has never used smokeless tobacco. She reports that she does not  currently use alcohol. She reports that she does not currently use drugs after having used the following drugs: Cocaine and Marijuana.   Family History  Problem Relation Age of Onset   Cancer Mother    ______________________________________________________________________________________________ Allergies: No Known Allergies   Prior to Admission medications   Medication Sig Start Date End Date Taking? Authorizing Provider  Darunavir-Cobicisctat-Emtricitabine-Tenofovir Alafenamide (SYMTUZA) 800-150-200-10 MG TABS TAKE 1 TABLET BY MOUTH DAILY WITH BREAKFAST. 07/07/20 07/07/21 Yes Campbell Riches, MD  furosemide (LASIX) 40 MG tablet Take 1 tablet (40 mg total) by mouth daily. 12/06/20  Yes Chandrasekhar, Mahesh A, MD  methadone (DOLOPHINE) 10 MG/5ML solution Take 30 mg by mouth daily.    [provider]  rosuvastatin (CRESTOR) 5 MG tablet Take 1 tablet (5 mg total) by mouth daily. 06/22/20 09/20/20  Werner Lean, MD    ___________________________________________________________________________________________________ Physical Exam: Vitals with BMI 03/10/2021 03/10/2021 03/10/2021  Height - - -  Weight - - -  BMI - - -  Systolic [id number] 98  Diastolic 69 63 61  Pulse 57 52 51     1. General:  in No  Acute distress    Chronically ill   -appearing 2. Psychological: Alert and    Oriented 3. Head/ENT:    Dry Mucous Membranes                          Head Non traumatic, neck supple                      Poor Dentition 4. SKIN: normal   decreased Skin turgor,  Skin clean Dry and intact no rash 5. Heart: Regular rate and rhythm no  Murmur, no Rub or gallop 6. Lungs:  some  wheezes or crackles   7. Abdomen: Soft,  non-tender, Non distended   obese  bowel sounds present 8. Lower extremities: no clubbing, cyanosis, no  edema 9. Neurologically Grossly intact, moving all 4 extremities equally  intact 10. MSK: Normal range of motion    Chart has been  reviewed  ______________________________________________________________________________________________  Assessment/Plan  70 y.o. female with medical history significant of HIV  thoracic aortic aneurysm, substance use, prior colon cancer , hep C  Admitted for CAP and acute respiratory failure  Present on Admission:  CAP (community acquired pneumonia) -   Patient presenting with  productive cough, fever    hypoxia  , and infiltrate  chest x-ray consistent with pneumonia. -This appears to be most likely community-acquired pneumonia. vs PCP PNA  will admit for treatment of CAP will start on appropriate antibiotic coverage. - Rocephin/azithromycin   Obtain:  sputum cultures,                  Obtain respiratory panel                    influenza serologies negative  COVID PCR negative                     blood cultures and sputum cultures ordered                   strep pneumo UA antigen,                   heck for Legionella antigen.               Given known history of HIV At PCP pneumonia coverage with Bactrim and steroids as per ID, commendations                Provide oxygen as needed.    Tobacco abuse -patient states that she quit    Opioid dependence ([place]) -continue home dose of methadone   Human immunodeficiency virus (HIV) disease ([place]) -patient has been noncompliant with home medications will defer to ID which home meds to restart.  For now check HIV viral load and CD4   Anemia -chronic stable check anemia panel in AM   Acute respiratory failure with hypoxia (HCC) -  this patient has acute respiratory failure with Hypoxia   as documented by the presence of following: O2 saturatio< 90% on RA  Likely due to:   Pneumonia,  Provide O2 therapy and titrate as needed  Continuous pulse ox   check Pulse ox with ambulation prior to discharge   may need  TC consult for home O2 set up     Hyperkalemia given a dose of [place] expect some degree of hyperkalemia in  the setting of Bactrim continue to monitor in 3 doses Needed   Other cirrhosis of liver (HCC)-chronic stable likely explaining thrombocytopenia.  And mild elevation of LFTs we will continue to monitor    Thrombocytopenia (HCC) -chronic but somewhat worse from baseline continue to monitor avoid Lovenox   Other plan as per orders.  DVT prophylaxis:  SCD    Code Status:    Code Status: Prior FULL CODE  as per patient   I had personally discussed CODE STATUS with patient      Family Communication:   Family not at  Bedside    Disposition Plan:    To home once workup is complete and patient is stable   Following barriers for discharge:                             Electrolytes corrected                               Anemia stable    Afebrile, white count improving able to transition to PO antibiotics                             Will need to be able to tolerate PO                            Will likely need home health, home O2, set up                           Will need consultants to evaluate patient prior to discharge  Would benefit from PT/OT eval prior to DC  Ordered                                       Consults called: ID is aware  Admission status:  ED Disposition     ED Disposition  Admit   Condition  --   Central: [place] [[id number]]  Level of Care: Progressive [102]  Admit to Progressive based on following criteria: RESPIRATORY PROBLEMS hypoxemic/hypercapnic respiratory failure that is responsive to NIPPV (BiPAP) or High Flow Nasal Cannula (6-80 lpm). Frequent assessment/intervention, no > Q2 hrs < Q4 hrs, to maintain oxygenation and pulmonary hygiene.  May admit patient to [facility] or [facility] if equivalent level of care is available:: No  Covid Evaluation: Confirmed COVID Negative  Diagnosis: CAP (community acquired pneumonia) [id number]  Admitting Physician: Toy Baker [[id number]]  Attending  Physician: Toy Baker [[id number]]  Estimated length of stay: past midnight tomorrow  Certification:: I certify this patient will need inpatient services for at least 2 midnights            inpatient     I Expect 2 midnight stay secondary to severity of patient's current illness need for inpatient interventions justified by the following:  hemodynamic instability despite optimal treatment (tachycardia  hypoxia,  )   Severe lab/radiological/exam abnormalities including:  PNA   and extensive comorbidities including:  substance abuse   Chronic pain  HIV  That are currently affecting medical management.   I expect  patient to be hospitalized for 2 midnights requiring inpatient medical care.  Patient is at high risk for adverse outcome (such as loss of life or disability) if not treated.  Indication for inpatient stay as follows:    Hemodynamic instability despite maximal medical therapy,     New or worsening hypoxia  Need for IV antibiotics, IV fluids,    Level of care   progressive tele indefinitely please discontinue once patient no longer qualifies COVID-19 Labs    Lab Results  Component Value Date   [place] 03/10/2021     Precautions: admitted as  Covid Negative   PPE: Used by the provider:   N95  eye Goggles,  Gloves    Maecyn Panning 03/11/2021, 2:50 AM    [facility]     after 2 AM please page floor coverage PA If 7AM-7PM, please contact the day team taking care of the patient using [email or web address]   Patient was evaluated in the context of the global COVID-19 pandemic, which necessitated consideration that the patient might be at risk for infection with the SARS-CoV-2 virus that causes COVID-19. Institutional protocols and algorithms that pertain to the evaluation of patients at risk for COVID-19 are in a state of rapid change based on information released by regulatory bodies including the CDC and federal and state organizations.  These policies and algorithms were followed during the patient's care.

## 2021-03-10 NOTE — ED Notes (Signed)
Patient transported to CT 

## 2021-03-10 NOTE — Progress Notes (Signed)
Pharmacy Antibiotic Note  Ruth Stephenson is a 70 y.o. female admitted on 03/10/2021 with pneumonia.  Pharmacy has been consulted for Bactrim dosing. PMH significant for HIV on Bactrim px which she had stopped taking for a couple months & resumed ~ 3 days ago. Scr WNL, K+ elevated at 5.3- MD ordered 10gm Lokelma.   Plan: Bactrim IV '320mg'$  TMP component q8h (~'15mg'$ /kg/day) Monitor renal function, potassium and cx data    Height: '5\' 3"'$  (160 cm) Weight: 61.2 kg (135 lb) IBW/kg (Calculated) : 52.4  Temp (24hrs), Avg:99.9 F (37.7 C), Min:99.1 F (37.3 C), Max:100.7 F (38.2 C)  Recent Labs  Lab 03/10/21 1826  WBC 8.2  CREATININE 0.83  LATICACIDVEN 1.5    Estimated Creatinine Clearance: 53.7 mL/min (by C-G formula based on SCr of 0.83 mg/dL).    No Known Allergies  Antimicrobials this admission: 9/15 Rocephin >>  9/15 Zithromax >>  9/15 Bactrim  Dose adjustments this admission:  Microbiology results: 9/15 BCx:   Thank you for allowing pharmacy to be a part of this patient's care.  Netta Cedars PharmD 03/10/2021 10:04 PM

## 2021-03-11 ENCOUNTER — Inpatient Hospital Stay (HOSPITAL_COMMUNITY): Payer: Medicare HMO

## 2021-03-11 ENCOUNTER — Encounter (HOSPITAL_COMMUNITY): Payer: Self-pay | Admitting: Internal Medicine

## 2021-03-11 DIAGNOSIS — J9601 Acute respiratory failure with hypoxia: Secondary | ICD-10-CM | POA: Diagnosis not present

## 2021-03-11 DIAGNOSIS — Z9119 Patient's noncompliance with other medical treatment and regimen: Secondary | ICD-10-CM

## 2021-03-11 DIAGNOSIS — R0602 Shortness of breath: Secondary | ICD-10-CM

## 2021-03-11 DIAGNOSIS — D696 Thrombocytopenia, unspecified: Secondary | ICD-10-CM | POA: Diagnosis not present

## 2021-03-11 DIAGNOSIS — J189 Pneumonia, unspecified organism: Secondary | ICD-10-CM

## 2021-03-11 DIAGNOSIS — K7469 Other cirrhosis of liver: Secondary | ICD-10-CM

## 2021-03-11 DIAGNOSIS — B20 Human immunodeficiency virus [HIV] disease: Secondary | ICD-10-CM | POA: Diagnosis not present

## 2021-03-11 LAB — CBC WITH DIFFERENTIAL/PLATELET
Abs Immature Granulocytes: 0.05 10*3/uL (ref 0.00–0.07)
Basophils Absolute: 0 10*3/uL (ref 0.0–0.1)
Basophils Relative: 0 %
Eosinophils Absolute: 0 10*3/uL (ref 0.0–0.5)
Eosinophils Relative: 0 %
HCT: 35.3 % — ABNORMAL LOW (ref 36.0–46.0)
Hemoglobin: 11.4 g/dL — ABNORMAL LOW (ref 12.0–15.0)
Immature Granulocytes: 1 %
Lymphocytes Relative: 8 %
Lymphs Abs: 0.6 10*3/uL — ABNORMAL LOW (ref 0.7–4.0)
MCH: 33.2 pg (ref 26.0–34.0)
MCHC: 32.3 g/dL (ref 30.0–36.0)
MCV: 102.9 fL — ABNORMAL HIGH (ref 80.0–100.0)
Monocytes Absolute: 0.4 10*3/uL (ref 0.1–1.0)
Monocytes Relative: 6 %
Neutro Abs: 5.9 10*3/uL (ref 1.7–7.7)
Neutrophils Relative %: 85 %
Platelets: 74 10*3/uL — ABNORMAL LOW (ref 150–400)
RBC: 3.43 MIL/uL — ABNORMAL LOW (ref 3.87–5.11)
RDW: 13.2 % (ref 11.5–15.5)
WBC: 7 10*3/uL (ref 4.0–10.5)
nRBC: 0 % (ref 0.0–0.2)

## 2021-03-11 LAB — STREP PNEUMONIAE URINARY ANTIGEN: Strep Pneumo Urinary Antigen: POSITIVE — AB

## 2021-03-11 LAB — ECHOCARDIOGRAM COMPLETE
AR max vel: 2.35 cm2
AV Area VTI: 2.27 cm2
AV Area mean vel: 2.42 cm2
AV Mean grad: 9 mmHg
AV Peak grad: 15.5 mmHg
Ao pk vel: 1.97 m/s
Area-P 1/2: 3.24 cm2
Height: 63 in
S' Lateral: 2.7 cm
Single Plane A4C EF: 70.8 %
Weight: 2160 oz

## 2021-03-11 LAB — COMPREHENSIVE METABOLIC PANEL
ALT: 15 U/L (ref 0–44)
AST: 25 U/L (ref 15–41)
Albumin: 3 g/dL — ABNORMAL LOW (ref 3.5–5.0)
Alkaline Phosphatase: 64 U/L (ref 38–126)
Anion gap: 5 (ref 5–15)
BUN: 10 mg/dL (ref 8–23)
CO2: 29 mmol/L (ref 22–32)
Calcium: 8.2 mg/dL — ABNORMAL LOW (ref 8.9–10.3)
Chloride: 102 mmol/L (ref 98–111)
Creatinine, Ser: 0.68 mg/dL (ref 0.44–1.00)
GFR, Estimated: >60 mL/min (ref >60)
Glucose, Bld: 144 mg/dL — ABNORMAL HIGH (ref 70–99)
Potassium: 4.1 mmol/L (ref 3.5–5.1)
Sodium: 136 mmol/L (ref 135–145)
Total Bilirubin: 1.2 mg/dL (ref 0.3–1.2)
Total Protein: 7.2 g/dL (ref 6.5–8.1)

## 2021-03-11 LAB — RETICULOCYTES
Immature Retic Fract: 24.7 % — ABNORMAL HIGH (ref 2.3–15.9)
RBC.: 3.47 MIL/uL — ABNORMAL LOW (ref 3.87–5.11)
Retic Count, Absolute: 70.4 10*3/uL (ref 19.0–186.0)
Retic Ct Pct: 2 % (ref 0.4–3.1)

## 2021-03-11 LAB — CK: Total CK: 50 U/L (ref 38–234)

## 2021-03-11 LAB — FOLATE: Folate: 12.9 ng/mL (ref >5.9)

## 2021-03-11 LAB — PHOSPHORUS
Phosphorus: 2.7 mg/dL (ref 2.5–4.6)
Phosphorus: 4.1 mg/dL (ref 2.5–4.6)

## 2021-03-11 LAB — IRON AND TIBC
Iron: 42 ug/dL (ref 28–170)
Saturation Ratios: 15 % (ref 10.4–31.8)
TIBC: 274 ug/dL (ref 250–450)
UIBC: 232 ug/dL

## 2021-03-11 LAB — EXPECTORATED SPUTUM ASSESSMENT W GRAM STAIN, RFLX TO RESP C

## 2021-03-11 LAB — FERRITIN: Ferritin: 119 ng/mL (ref 11–307)

## 2021-03-11 LAB — T-HELPER CELLS (CD4) COUNT (NOT AT ARMC)
CD4 % Helper T Cell: 16 % — ABNORMAL LOW (ref 33–65)
CD4 T Cell Abs: 116 /uL — ABNORMAL LOW (ref 400–1790)

## 2021-03-11 LAB — VITAMIN B12: Vitamin B-12: 383 pg/mL (ref 180–914)

## 2021-03-11 LAB — MAGNESIUM
Magnesium: 1.7 mg/dL (ref 1.7–2.4)
Magnesium: 1.7 mg/dL (ref 1.7–2.4)

## 2021-03-11 LAB — TSH: TSH: 1.308 u[IU]/mL (ref 0.350–4.500)

## 2021-03-11 LAB — PROCALCITONIN: Procalcitonin: 0.12 ng/mL

## 2021-03-11 LAB — BRAIN NATRIURETIC PEPTIDE: B Natriuretic Peptide: 327.2 pg/mL — ABNORMAL HIGH (ref 0.0–100.0)

## 2021-03-11 LAB — ETHANOL: Alcohol, Ethyl (B): <10 mg/dL (ref <10)

## 2021-03-11 MED ORDER — AZITHROMYCIN 500 MG IV SOLR
500.0000 mg | INTRAVENOUS | Status: DC
Start: 2021-03-11 — End: 2021-03-11

## 2021-03-11 MED ORDER — HYDRALAZINE HCL 20 MG/ML IJ SOLN: 10.0000 mg | INTRAMUSCULAR | Status: DC | PRN

## 2021-03-11 MED ORDER — IPRATROPIUM-ALBUTEROL 0.5-2.5 (3) MG/3ML IN SOLN
3.0000 mL | Freq: Four times a day (QID) | RESPIRATORY_TRACT | Status: DC
  Administered 2021-03-11 (×2): 3 mL via RESPIRATORY_TRACT
  Filled 2021-03-11: qty 3

## 2021-03-11 MED ORDER — METHADONE HCL 10 MG PO TABS
30.0000 mg | ORAL_TABLET | Freq: Every day | ORAL | Status: DC
  Administered 2021-03-11 – 2021-03-14 (×4): 30 mg via ORAL
  Filled 2021-03-11: qty 3
  Filled 2021-03-11: qty 6
  Filled 2021-03-11 (×2): qty 3

## 2021-03-11 MED ORDER — ACETAMINOPHEN 325 MG PO TABS: 650.0000 mg | ORAL_TABLET | Freq: Four times a day (QID) | ORAL | Status: DC | PRN

## 2021-03-11 MED ORDER — DARUN-COBIC-EMTRICIT-TENOFAF 800-150-200-10 MG PO TABS
1.0000 | ORAL_TABLET | Freq: Every day | ORAL | Status: DC
  Administered 2021-03-12 – 2021-03-14 (×3): 1 via ORAL
  Filled 2021-03-11 (×3): qty 1

## 2021-03-11 MED ORDER — SODIUM CHLORIDE 0.9% FLUSH: 3.0000 mL | INTRAVENOUS | Status: DC | PRN

## 2021-03-11 MED ORDER — TRAZODONE HCL 50 MG PO TABS
50.0000 mg | ORAL_TABLET | Freq: Every evening | ORAL | Status: DC | PRN
  Administered 2021-03-11 – 2021-03-12 (×2): 50 mg via ORAL
  Filled 2021-03-11 (×3): qty 1

## 2021-03-11 MED ORDER — IPRATROPIUM-ALBUTEROL 0.5-2.5 (3) MG/3ML IN SOLN
3.0000 mL | Freq: Three times a day (TID) | RESPIRATORY_TRACT | Status: DC
  Administered 2021-03-11 – 2021-03-12 (×2): 3 mL via RESPIRATORY_TRACT
  Filled 2021-03-11 (×2): qty 3

## 2021-03-11 MED ORDER — SODIUM CHLORIDE 0.9 % IV SOLN: 250.0000 mL | INTRAVENOUS | Status: DC | PRN

## 2021-03-11 MED ORDER — SENNOSIDES-DOCUSATE SODIUM 8.6-50 MG PO TABS: 1.0000 | ORAL_TABLET | Freq: Every evening | ORAL | Status: DC | PRN

## 2021-03-11 MED ORDER — ACETAMINOPHEN 650 MG RE SUPP: 650.0000 mg | Freq: Four times a day (QID) | RECTAL | Status: DC | PRN

## 2021-03-11 MED ORDER — IPRATROPIUM-ALBUTEROL 0.5-2.5 (3) MG/3ML IN SOLN
3.0000 mL | RESPIRATORY_TRACT | Status: DC | PRN
  Filled 2021-03-11: qty 3

## 2021-03-11 MED ORDER — PREDNISONE 20 MG PO TABS
40.0000 mg | ORAL_TABLET | Freq: Two times a day (BID) | ORAL | Status: DC
  Administered 2021-03-11 – 2021-03-13 (×4): 40 mg via ORAL
  Filled 2021-03-11 (×4): qty 2

## 2021-03-11 MED ORDER — SODIUM CHLORIDE 0.9% FLUSH
3.0000 mL | Freq: Two times a day (BID) | INTRAVENOUS | Status: DC
  Administered 2021-03-11 – 2021-03-13 (×5): 3 mL via INTRAVENOUS

## 2021-03-11 MED ORDER — HYDROCODONE-ACETAMINOPHEN 5-325 MG PO TABS
1.0000 | ORAL_TABLET | ORAL | Status: DC | PRN
  Administered 2021-03-11: 1 via ORAL
  Administered 2021-03-11: 2 via ORAL
  Administered 2021-03-13: 1 via ORAL
  Filled 2021-03-11: qty 1
  Filled 2021-03-11: qty 2
  Filled 2021-03-11: qty 1

## 2021-03-11 MED ORDER — SODIUM CHLORIDE 0.9 % IV SOLN
2.0000 g | INTRAVENOUS | Status: DC
  Administered 2021-03-11 – 2021-03-13 (×3): 2 g via INTRAVENOUS
  Filled 2021-03-11: qty 20
  Filled 2021-03-11 (×2): qty 2

## 2021-03-11 MED ORDER — METOPROLOL TARTRATE 5 MG/5ML IV SOLN: 5.0000 mg | INTRAVENOUS | Status: DC | PRN

## 2021-03-11 MED ORDER — ACETAMINOPHEN 325 MG PO TABS
650.0000 mg | ORAL_TABLET | Freq: Four times a day (QID) | ORAL | Status: DC | PRN
  Administered 2021-03-12: 650 mg via ORAL
  Filled 2021-03-11: qty 2

## 2021-03-11 MED ORDER — ROSUVASTATIN CALCIUM 5 MG PO TABS
5.0000 mg | ORAL_TABLET | Freq: Every day | ORAL | Status: DC
  Administered 2021-03-11 – 2021-03-14 (×4): 5 mg via ORAL
  Filled 2021-03-11 (×4): qty 1

## 2021-03-11 MED ORDER — SULFAMETHOXAZOLE-TRIMETHOPRIM 400-80 MG/5ML IV SOLN
320.0000 mg | Freq: Three times a day (TID) | INTRAVENOUS | Status: DC
  Administered 2021-03-11 – 2021-03-13 (×7): 320 mg via INTRAVENOUS
  Filled 2021-03-11 (×7): qty 20

## 2021-03-11 MED ORDER — DOCUSATE SODIUM 100 MG PO CAPS
100.0000 mg | ORAL_CAPSULE | Freq: Two times a day (BID) | ORAL | Status: DC
  Administered 2021-03-11 – 2021-03-14 (×7): 100 mg via ORAL
  Filled 2021-03-11 (×7): qty 1

## 2021-03-11 MED ORDER — BUDESONIDE 0.5 MG/2ML IN SUSP
0.5000 mg | Freq: Two times a day (BID) | RESPIRATORY_TRACT | Status: DC
  Administered 2021-03-11 – 2021-03-14 (×6): 0.5 mg via RESPIRATORY_TRACT
  Filled 2021-03-11 (×7): qty 2

## 2021-03-11 MED ORDER — SENNA 8.6 MG PO TABS
1.0000 | ORAL_TABLET | Freq: Two times a day (BID) | ORAL | Status: DC
  Administered 2021-03-11 – 2021-03-14 (×7): 8.6 mg via ORAL
  Filled 2021-03-11 (×7): qty 1

## 2021-03-11 MED ORDER — GUAIFENESIN-DM 100-10 MG/5ML PO SYRP
5.0000 mL | ORAL_SOLUTION | ORAL | Status: DC | PRN
  Administered 2021-03-13 (×2): 5 mL via ORAL
  Filled 2021-03-11 (×2): qty 10

## 2021-03-11 MED ORDER — MAGNESIUM SULFATE IN D5W 1-5 GM/100ML-% IV SOLN
1.0000 g | Freq: Once | INTRAVENOUS | Status: AC
  Administered 2021-03-11: 1 g via INTRAVENOUS
  Filled 2021-03-11: qty 100

## 2021-03-11 NOTE — Progress Notes (Signed)
PROGRESS NOTE    Ruth Stephenson  [id number] DOB: 1951/06/26 DOA: 03/10/2021 PCP: System, Provider Not In   Brief Narrative:  70 year old with history of HIV, thoracic aortic aneurysm, substance abuse, prior colon cancer, hep C comes to the hospital with complaints of body ache, fatigue and cough.  Patient has not seen ID in at least a year and has been off meds for 6 months.  He started taking Bactrim and HIV meds only few days prior to admission.  Infectious disease consulted.   Assessment & Plan:   Active Problems:   Human immunodeficiency virus (HIV) disease ([place])   Tobacco abuse   Anemia   Opioid dependence ([place])   Thoracic aortic aneurysm without rupture ([place])   CAP (community acquired pneumonia)   Acute respiratory failure with hypoxia ([facility])   Hyperkalemia   Other cirrhosis of liver ([facility])   Thrombocytopenia ([facility])   Community-acquired pneumonia, Multifocal right sided Acute Resp distress with Hypoxia -Currently patient is on Rocephin and azithromycin and Bactrim.  We will consult infectious disease for their input.  Prednisone and bronchodilators ordered.  Check procalcitonin, BNP.  Supplemental oxygen.  Sputum cultures ordered.  If BNP elevated, will get Echo.  - CTA shows right-sided multifocal bronchopneumonia  HIV -Supposed to be on outpatient Symtuza and follows with Dr. Johnnye Sima.  Last seen about 9 months ago.  Repeat CD4 counts ordered  Chronic Anemia, macrocytosis Thrombocytopenia - Closely monitor hemoglobin levels.  Check iron studies, ferritin  Cirrhosis with history of hepatitis C - Previously been treated for hep C.  Hyperlipidemia - Daily statin  Chronic pain - On methadone   DVT prophylaxis: SCDs Start: 03/11/21 0010 Code Status: Full Family Communication:    Status is: Inpatient  Remains inpatient appropriate because:Inpatient level of care appropriate due to severity of illness  Dispo: The patient is from: Home              Anticipated  d/c is to: Home              Patient currently is not medically stable to d/c.   Difficult to place patient No       Subjective: Feels little better but still having quite a bit of cough and congestion.  Review of Systems Otherwise negative except as per HPI, including: General: Denies fever, chills, night sweats or unintended weight loss. Resp: Denies hemoptysis Cardiac: Denies chest pain, palpitations, orthopnea, paroxysmal nocturnal dyspnea. GI: Denies abdominal pain, nausea, vomiting, diarrhea or constipation GU: Denies dysuria, frequency, hesitancy or incontinence MS: Denies muscle aches, joint pain or swelling Neuro: Denies headache, neurologic deficits (focal weakness, numbness, tingling), abnormal gait Psych: Denies anxiety, depression, SI/HI/AVH Skin: Denies new rashes or lesions ID: Denies sick contacts, exotic exposures, travel  Examination:  General exam: Appears calm and comfortable  Respiratory system: Diffuse bilateral rhonchi Cardiovascular system: S1 & S2 heard, RRR. No JVD, murmurs, rubs, gallops or clicks. No pedal edema. Gastrointestinal system: Abdomen is nondistended, soft and nontender. No organomegaly or masses felt. Normal bowel sounds heard. Central nervous system: Alert and oriented. No focal neurological deficits. Extremities: Symmetric 5 x 5 power. Skin: No rashes, lesions or ulcers Psychiatry: Judgement and insight appear normal. Mood & affect appropriate.     Objective: Vitals:   03/11/21 0230 03/11/21 0545 03/11/21 0600 03/11/21 0630  BP: 1'23/67 98/66 93/63 '$ 108/66  Pulse: 62 (!) 45 (!) 44 (!) 48  Resp: '16 12 13 13  '$ Temp:      TempSrc:  SpO2: 94% 96% 96% 94%  Weight:      Height:        Intake/Output Summary (Last 24 hours) at 03/11/2021 0833 Last data filed at 03/11/2021 0651 Gross per 24 hour  Intake 2970 ml  Output --  Net 2970 ml   Filed Weights   03/10/21 1437  Weight: 61.2 kg     Data Reviewed:   CBC: Recent  Labs  Lab 03/10/21 1826 03/11/21 0554  WBC 8.2 7.0  NEUTROABS 6.3 5.9  HGB 13.0 11.4*  HCT 40.5 35.3*  MCV 100.7* 102.9*  PLT 87* 74*   Basic Metabolic Panel: Recent Labs  Lab 03/10/21 1826 03/11/21 0007 03/11/21 0554  NA 140  --  136  K 5.3*  --  4.1  CL 105  --  102  CO2 28  --  29  GLUCOSE 110*  --  144*  BUN 11  --  10  CREATININE 0.83  --  0.68  CALCIUM 9.2  --  8.2*  MG  --  1.7 1.7  PHOS  --  4.1 2.7   GFR: Estimated Creatinine Clearance: 55.7 mL/min (by C-G formula based on SCr of 0.68 mg/dL). Liver Function Tests: Recent Labs  Lab 03/10/21 1826 03/11/21 0554  AST 51* 25  ALT 18 15  ALKPHOS 71 64  BILITOT 2.2* 1.2  PROT 8.4* 7.2  ALBUMIN 3.5 3.0*   Recent Labs  Lab 03/10/21 1826  LIPASE 30   No results for input(s): AMMONIA in the last 168 hours. Coagulation Profile: No results for input(s): INR, PROTIME in the last 168 hours. Cardiac Enzymes: Recent Labs  Lab 03/11/21 0007  CKTOTAL 50   BNP (last 3 results) No results for input(s): PROBNP in the last 8760 hours. HbA1C: No results for input(s): HGBA1C in the last 72 hours. CBG: No results for input(s): GLUCAP in the last 168 hours. Lipid Profile: No results for input(s): CHOL, HDL, LDLCALC, TRIG, CHOLHDL, LDLDIRECT in the last 72 hours. Thyroid Function Tests: Recent Labs    03/11/21 0554  TSH 1.308   Anemia Panel: Recent Labs    03/11/21 0554  VITAMINB12 383  FOLATE 12.9  FERRITIN 119  TIBC 274  IRON 42  RETICCTPCT 2.0   Sepsis Labs: Recent Labs  Lab 03/10/21 1826  LATICACIDVEN 1.5    Recent Results (from the past 240 hour(s))  Resp Panel by RT-PCR (Flu A&B, Covid) Nasopharyngeal Swab     Status: None   Collection Time: 03/10/21  5:48 PM   Specimen: Nasopharyngeal Swab; Nasopharyngeal(NP) swabs in vial transport medium  Result Value Ref Range Status   SARS Coronavirus 2 by RT PCR NEGATIVE NEGATIVE Final    Comment: (NOTE) SARS-CoV-2 target nucleic acids are NOT  DETECTED.  The SARS-CoV-2 RNA is generally detectable in upper respiratory specimens during the acute phase of infection. The lowest concentration of SARS-CoV-2 viral copies this assay can detect is 138 copies/mL. A negative result does not preclude SARS-Cov-2 infection and should not be used as the sole basis for treatment or other patient management decisions. A negative result may occur with  improper specimen collection/handling, submission of specimen other than nasopharyngeal swab, presence of viral mutation(s) within the areas targeted by this assay, and inadequate number of viral copies(<138 copies/mL). A negative result must be combined with clinical observations, patient history, and epidemiological information. The expected result is Negative.  Fact Sheet for Patients:  EntrepreneurPulse.com.au  Fact Sheet for Healthcare Providers:  IncredibleEmployment.be  This test is  no t yet approved or cleared by the [place] and  has been authorized for detection and/or diagnosis of SARS-CoV-2 by FDA under an Emergency Use Authorization (EUA). This EUA will remain  in effect (meaning this test can be used) for the duration of the COVID-19 declaration under Section 564(b)(1) of the Act, 21 U.S.C.section 360bbb-3(b)(1), unless the authorization is terminated  or revoked sooner.       Influenza A by PCR NEGATIVE NEGATIVE Final   Influenza B by PCR NEGATIVE NEGATIVE Final    Comment: (NOTE) The Xpert Xpress SARS-CoV-2/FLU/RSV plus assay is intended as an aid in the diagnosis of influenza from Nasopharyngeal swab specimens and should not be used as a sole basis for treatment. Nasal washings and aspirates are unacceptable for Xpert Xpress SARS-CoV-2/FLU/RSV testing.  Fact Sheet for Patients: EntrepreneurPulse.com.au  Fact Sheet for Healthcare Providers: IncredibleEmployment.be  This test is not yet  approved or cleared by the [place] FDA and has been authorized for detection and/or diagnosis of SARS-CoV-2 by FDA under an Emergency Use Authorization (EUA). This EUA will remain in effect (meaning this test can be used) for the duration of the COVID-19 declaration under Section 564(b)(1) of the Act, 21 U.S.C. section 360bbb-3(b)(1), unless the authorization is terminated or revoked.  Performed at [facility], [place] [address] [place], [place] [address]   Expectorated Sputum Assessment w Gram Stain, Rflx to Resp Cult     Status: None   Collection Time: 03/11/21  2:46 AM   Specimen: Expectorated Sputum  Result Value Ref Range Status   Specimen Description EXPECTORATED SPUTUM  Final   Special Requests Immunocompromised  Final   Sputum evaluation   Final    THIS SPECIMEN IS ACCEPTABLE FOR SPUTUM CULTURE Performed at [facility], [place] [address] [place], [place] [address]    Report Status 03/11/2021 FINAL  Final         Radiology Studies: CT HEAD WO CONTRAST (5MM)  Result Date: 03/10/2021 CLINICAL DATA:  Headache body aches EXAM: CT HEAD WITHOUT CONTRAST TECHNIQUE: Contiguous axial images were obtained from the base of the skull through the vertex without intravenous contrast. COMPARISON:  CT brain 05/04/2017 FINDINGS: Brain: No evidence of acute infarction, hemorrhage, hydrocephalus, extra-axial collection or mass lesion/mass effect. Vascular: No hyperdense vessel or unexpected calcification. Carotid vascular calcification Skull: Normal. Negative for fracture or focal lesion. Sinuses/Orbits: Patchy mucosal thickening in the sinuses Other: None IMPRESSION: Negative non contrasted CT appearance of the brain Electronically Signed   By: Donavan Foil M.D.   On: 03/10/2021 20:40   DG Chest Portable 1 View  Result Date: 03/10/2021 CLINICAL DATA:  Shortness of breath with cough EXAM: PORTABLE CHEST 1 VIEW COMPARISON:  06/04/2020, chest CT  01/23/2018, chest x-ray 09/09/2018 FINDINGS: Emphysematous disease. Mild diffuse reticular opacity likely due to chronic disease. No acute confluent airspace disease or pleural effusion. Stable cardiomediastinal silhouette with aortic atherosclerosis. IMPRESSION: No active disease.  Emphysema Electronically Signed   By: Donavan Foil M.D.   On: 03/10/2021 17:46   CT Angio Chest/Abd/Pel for Dissection W and/or W/WO  Result Date: 03/10/2021 CLINICAL DATA:  Body aches, cough, nausea for 5 days, fever, history of thoracic aortic aneurysm, abdominal and back pain EXAM: CT ANGIOGRAPHY CHEST, ABDOMEN AND PELVIS TECHNIQUE: Non-contrast CT of the chest was initially obtained. Multidetector CT imaging through the chest, abdomen and pelvis was performed using the standard protocol during bolus administration of intravenous contrast. Multiplanar reconstructed images and MIPs were obtained  and reviewed to evaluate the vascular anatomy. CONTRAST:  161m OMNIPAQUE IOHEXOL 350 MG/ML SOLN COMPARISON:  01/23/2018, 03/10/2021 FINDINGS: CTA CHEST FINDINGS Cardiovascular: No evidence of thoracic aortic aneurysm or dissection. Minimal atherosclerosis of the aortic arch. Heart is unremarkable without pericardial effusion. Mediastinum/Nodes: Subcentimeter lymph nodes are seen within the subcarinal region and right hilum, likely reactive. Thyroid, trachea, and esophagus are unremarkable. Lungs/Pleura: There is patchy airspace disease within the right upper and right lower lobes consistent with bronchopneumonia. No effusion or pneumothorax. Chronic left upper lobe scarring and background emphysema. Central airways are patent. Musculoskeletal: No acute or destructive bony lesions. Reconstructed images demonstrate no additional findings. Review of the MIP images confirms the above findings. CTA ABDOMEN AND PELVIS FINDINGS VASCULAR Aorta: Normal caliber aorta without aneurysm, dissection, vasculitis or significant stenosis. Mild diffuse  atherosclerosis. Celiac: Patent without evidence of aneurysm, dissection, vasculitis or significant stenosis. Mild atherosclerosis at the origin without significant stenosis. SMA: Patent without evidence of aneurysm, dissection, vasculitis or significant stenosis. Renals: Both renal arteries are patent without evidence of aneurysm, dissection, vasculitis, fibromuscular dysplasia or significant stenosis. There is an accessory right renal artery supplying the lower pole which is widely patent. IMA: Patent without evidence of aneurysm, dissection, vasculitis or significant stenosis. Inflow: Patent without evidence of aneurysm, dissection, vasculitis or significant stenosis. Veins: No obvious venous abnormality within the limitations of this arterial phase study. Review of the MIP images confirms the above findings. NON-VASCULAR Hepatobiliary: Diffuse nodularity of the liver capsule compatible with cirrhosis. No focal parenchymal abnormality. The gallbladder is unremarkable. Pancreas: Unremarkable. No pancreatic ductal dilatation or surrounding inflammatory changes. Spleen: The spleen is enlarged measuring 13.5 x 13.8 x 7.2 cm. No focal abnormalities. Adrenals/Urinary Tract: Kidneys enhance normally and symmetrically. No urinary tract calculi or obstructive uropathy. The adrenals and bladder are unremarkable. Stomach/Bowel: No bowel obstruction or ileus. Postsurgical changes from partial right hemicolectomy and reanastomosis. No bowel wall thickening or inflammatory change. Lymphatic: No pathologic adenopathy. Reproductive: Uterus and bilateral adnexa are unremarkable. Other: No free fluid or free gas. Small fat containing supraumbilical ventral hernia. No bowel herniation. Musculoskeletal: No acute or destructive bony lesions. Reconstructed images demonstrate no additional findings. Review of the MIP images confirms the above findings. IMPRESSION: 1. No evidence of thoracoabdominal aortic aneurysm or dissection. 2.  Multifocal right-sided bronchopneumonia. 3. 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 4. Small fat containing supraumbilical ventral hernia. 5. Aortic Atherosclerosis (ICD10-I70.0) and Emphysema (ICD10-J43.9). Electronically Signed   By: MRanda NgoM.D.   On: 03/10/2021 20:47        Scheduled Meds:  docusate sodium  100 mg Oral BID   methadone  30 mg Oral Daily   predniSONE  40 mg Oral BID   rosuvastatin  5 mg Oral Daily   senna  1 tablet Oral BID   sodium chloride flush  3 mL Intravenous Q12H   sodium zirconium cyclosilicate  10 g Oral Once   Continuous Infusions:  sodium chloride     azithromycin     cefTRIAXone (ROCEPHIN)  IV     sulfamethoxazole-trimethoprim 320 mg (03/11/21 0747)     LOS: 1 day   Time spent= 35 mins    Emlyn Maves CArsenio Loader MD [facility]  If 7PM-7AM, please contact night-coverage  03/11/2021, 8:33 AM

## 2021-03-11 NOTE — Progress Notes (Signed)
PT demonstrated hands on understanding of Flutter device. PC at this time. 

## 2021-03-11 NOTE — Consult Note (Signed)
Date of Admission:  03/10/2021          Reason for Consult: Multifocal pneumonia and patient with HIV and AIDS    Referring Provider: Jeanella Flattery, MD   Assessment:  Multi-focal right-sided pneumonia pyogenic vs PCP HIV/AIDS with recent poor adherence Prior hepatitis C status post cure Cirrhosis of the liver  Plan:  Continue ceftriaxone but will discontinue azithromycin Continue Bactrim and prednisone Follow-up LDH added to admission labs as well as PCP DFA Check HIV VL and genotype Restart her Symtuza  Dr. Juleen China to check in on over the weekend.  Active Problems:   Human immunodeficiency virus (HIV) disease ([place])   Tobacco abuse   Anemia   Opioid dependence ([place])   Thoracic aortic aneurysm without rupture (HCC)   CAP (community acquired pneumonia)   Acute respiratory failure with hypoxia (HCC)   Hyperkalemia   Other cirrhosis of liver (HCC)   Thrombocytopenia (HCC)   Scheduled Meds:  budesonide (PULMICORT) nebulizer solution  0.5 mg Nebulization BID   docusate sodium  100 mg Oral BID   ipratropium-albuterol  3 mL Nebulization TID   methadone  30 mg Oral Daily   predniSONE  40 mg Oral BID   rosuvastatin  5 mg Oral Daily   senna  1 tablet Oral BID   sodium chloride flush  3 mL Intravenous Q12H   sodium zirconium cyclosilicate  10 g Oral Once   Continuous Infusions:  sodium chloride     cefTRIAXone (ROCEPHIN)  IV     sulfamethoxazole-trimethoprim 320 mg (03/11/21 0747)   PRN Meds:.sodium chloride, acetaminophen, hydrALAZINE, HYDROcodone-acetaminophen, ipratropium-albuterol, metoprolol tartrate, senna-docusate, sodium chloride flush, traZODone  HPI: MIAJA NORCOTT is a 70 y.o. female with HIV and AIDS originally cared for in [place] who had been on Prezista Norvir and Truvada and had trouble tolerating it later switched to [place] ultimately switched back to a darunavir based regimen and SYMTUZA.  She has through looking at the records  largely been adherent and typically had viral loads that have been undetectable her virus in 2014 off medications was wild-type.  She recently ran out of her medications roughly a month ago. (Though admission H&P states 6 months)  In talking to her it also appears that she is not always taking her medications all the time taking "breaks" of up to a week at a time.  She had several family members specifically grandchildren who had upper respiratory tract infections and she herself began to have sinus congestion and then then the cough which worsened with nausea body aches and a low-grade subjective fever.  She began taking Bactrim that she had on hand that she was supposed to be taking for prophylaxis to see if this would help.  Unfortunately her fevers and malaise worsened and she sought care in the emergency department.  In ER her chest x-ray did not reveal infiltrates but she underwent CT angiogram showed multifocal right-sided patchy opacities consistent with bronchopneumonia.  I actually am having trouble pulling up the canopy images.  Been started on treatment for commune acquired pneumonia with ceftriaxone and azithromycin as well as for PCP with Bactrim and prednisone.  Her CD4 count on admission is 116 with percent CD4 16% looking back through her CD4 they have hovered between the 100s and low 200s typically even when she has been suppressed  She expressed interest to me about going on that "new medicine" which appears to be Cabenuva. I older currently she would not  be a candidate for that medicine than what I would personally want to do further investigation into her virus and potential resistant she might have given how long she has had HIV and fact that she is not currently suppressed.  To emphasize it is critical that she always take her medications regardless of whether she has an appointment coming up when we are going to be checking a viral load or whether we do not know what she is  doing because keeping her virus suppressed is critical and something she should be attending to on a daily basis.  I spent 88 minutes with the patient including than 50% of the time in face to face counseling of the patient regarding the nature of her HIV infection need to be highly adherent to her medications her HIV medication history, the potential causes of her pneumonia including pyogenic ones versus PCP, along with personally reviewing chest x-ray CT angiogram CT head, CD4 count CBC differential CMP, culture data along with review of medical records in preparation for the visit and during the visit and in coordination of her care.    Review of Systems: Review of Systems  Constitutional:  Positive for chills, fever and malaise/fatigue. Negative for diaphoresis and weight loss.  HENT:  Negative for congestion, hearing loss, sore throat and tinnitus.   Eyes:  Negative for blurred vision and double vision.  Respiratory:  Positive for cough and sputum production. Negative for shortness of breath and wheezing.   Cardiovascular:  Negative for chest pain, palpitations and leg swelling.  Gastrointestinal:  Negative for abdominal pain, blood in stool, constipation, diarrhea, heartburn, melena, nausea and vomiting.  Genitourinary:  Negative for dysuria, flank pain and hematuria.  Musculoskeletal:  Negative for back pain, falls, joint pain and myalgias.  Skin:  Negative for itching and rash.  Neurological:  Negative for dizziness, sensory change, focal weakness, loss of consciousness, weakness and headaches.  Endo/Heme/Allergies:  Does not bruise/bleed easily.  Psychiatric/Behavioral:  Negative for depression, memory loss and suicidal ideas. The patient is not nervous/anxious.    Past Medical History:  Diagnosis Date   Cancer ([place])    Hepatitis C    HIV (human immunodeficiency virus infection) ([place])     Social History   Tobacco Use   Smoking status: Former    Packs/day: 0.50    Years: 35.00     Pack years: 17.50    Types: Cigarettes   Smokeless tobacco: Never   Tobacco comments:    trying to cut back; 4-5 cigarettes per day   Vaping Use   Vaping Use: Never used  Substance Use Topics   Alcohol use: Not Currently    Comment: Occasionally; none currently as of 01/28/18   Drug use: Not Currently    Types: Cocaine, Marijuana    Comment: Heroin; in outpatient drug proram (reported on 01/28/18 visit     Family History  Problem Relation Age of Onset   Cancer Mother    No Known Allergies  OBJECTIVE: Blood pressure 104/68, pulse (!) 57, temperature 98.1 F (36.7 C), temperature source Oral, resp. rate 19, height '5\' 3"'$  (1.6 m), weight 61.2 kg, SpO2 97 %.  Physical Exam Constitutional:      General: She is not in acute distress.    Appearance: Normal appearance. She is well-developed. She is not ill-appearing or diaphoretic.  HENT:     Head: Normocephalic and atraumatic.     Right Ear: Hearing and external ear normal.     Left Ear:  Hearing and external ear normal.     Nose: No nasal deformity or rhinorrhea.  Eyes:     General: No scleral icterus.    Conjunctiva/sclera: Conjunctivae normal.     Right eye: Right conjunctiva is not injected.     Left eye: Left conjunctiva is not injected.     Pupils: Pupils are equal, round, and reactive to light.  Neck:     Vascular: No JVD.  Cardiovascular:     Rate and Rhythm: Normal rate and regular rhythm.     Heart sounds: Normal heart sounds, S1 normal and S2 normal. No murmur heard.   No friction rub.  Pulmonary:     Effort: No respiratory distress.     Breath sounds: No stridor. No wheezing, rhonchi or rales.  Chest:     Chest wall: No tenderness.  Abdominal:     General: Bowel sounds are normal. There is no distension.     Palpations: Abdomen is soft. There is no mass.     Tenderness: There is no abdominal tenderness.  Musculoskeletal:        General: Normal range of motion.     Right shoulder: Normal.     Left shoulder:  Normal.     Cervical back: Normal range of motion and neck supple.     Right hip: Normal.     Left hip: Normal.     Right knee: Normal.     Left knee: Normal.  Lymphadenopathy:     Head:     Right side of head: No submandibular, preauricular or posterior auricular adenopathy.     Left side of head: No submandibular, preauricular or posterior auricular adenopathy.     Cervical: No cervical adenopathy.     Right cervical: No superficial or deep cervical adenopathy.    Left cervical: No superficial or deep cervical adenopathy.  Skin:    General: Skin is warm and dry.     Coloration: Skin is not pale.     Findings: No abrasion, bruising, ecchymosis, erythema, lesion or rash.     Nails: There is no clubbing.  Neurological:     General: No focal deficit present.     Mental Status: She is alert and oriented to person, place, and time.     Sensory: No sensory deficit.     Coordination: Coordination normal.     Gait: Gait normal.  Psychiatric:        Attention and Perception: She is attentive.        Mood and Affect: Mood normal.        Speech: Speech normal.        Behavior: Behavior normal. Behavior is cooperative.        Thought Content: Thought content normal.        Judgment: Judgment normal.    Lab Results Lab Results  Component Value Date   WBC 7.0 03/11/2021   HGB 11.4 (L) 03/11/2021   HCT 35.3 (L) 03/11/2021   MCV 102.9 (H) 03/11/2021   PLT 74 (L) 03/11/2021    Lab Results  Component Value Date   CREATININE 0.68 03/11/2021   BUN 10 03/11/2021   NA 136 03/11/2021   K 4.1 03/11/2021   CL 102 03/11/2021   CO2 29 03/11/2021    Lab Results  Component Value Date   ALT 15 03/11/2021   AST 25 03/11/2021   ALKPHOS 64 03/11/2021   BILITOT 1.2 03/11/2021     Microbiology: Recent Results (from the past  240 hour(s))  Resp Panel by RT-PCR (Flu A&B, Covid) Nasopharyngeal Swab     Status: None   Collection Time: 03/10/21  5:48 PM   Specimen: Nasopharyngeal Swab;  Nasopharyngeal(NP) swabs in vial transport medium  Result Value Ref Range Status   SARS Coronavirus 2 by RT PCR NEGATIVE NEGATIVE Final    Comment: (NOTE) SARS-CoV-2 target nucleic acids are NOT DETECTED.  The SARS-CoV-2 RNA is generally detectable in upper respiratory specimens during the acute phase of infection. The lowest concentration of SARS-CoV-2 viral copies this assay can detect is 138 copies/mL. A negative result does not preclude SARS-Cov-2 infection and should not be used as the sole basis for treatment or other patient management decisions. A negative result may occur with  improper specimen collection/handling, submission of specimen other than nasopharyngeal swab, presence of viral mutation(s) within the areas targeted by this assay, and inadequate number of viral copies(<138 copies/mL). A negative result must be combined with clinical observations, patient history, and epidemiological information. The expected result is Negative.  Fact Sheet for Patients:  EntrepreneurPulse.com.au  Fact Sheet for Healthcare Providers:  IncredibleEmployment.be  This test is no t yet approved or cleared by the [place] FDA and  has been authorized for detection and/or diagnosis of SARS-CoV-2 by FDA under an Emergency Use Authorization (EUA). This EUA will remain  in effect (meaning this test can be used) for the duration of the COVID-19 declaration under Section 564(b)(1) of the Act, 21 U.S.C.section 360bbb-3(b)(1), unless the authorization is terminated  or revoked sooner.       Influenza A by PCR NEGATIVE NEGATIVE Final   Influenza B by PCR NEGATIVE NEGATIVE Final    Comment: (NOTE) The Xpert Xpress SARS-CoV-2/FLU/RSV plus assay is intended as an aid in the diagnosis of influenza from Nasopharyngeal swab specimens and should not be used as a sole basis for treatment. Nasal washings and aspirates are unacceptable for Xpert Xpress  SARS-CoV-2/FLU/RSV testing.  Fact Sheet for Patients: EntrepreneurPulse.com.au  Fact Sheet for Healthcare Providers: IncredibleEmployment.be  This test is not yet approved or cleared by the [place] FDA and has been authorized for detection and/or diagnosis of SARS-CoV-2 by FDA under an Emergency Use Authorization (EUA). This EUA will remain in effect (meaning this test can be used) for the duration of the COVID-19 declaration under Section 564(b)(1) of the Act, 21 U.S.C. section 360bbb-3(b)(1), unless the authorization is terminated or revoked.  Performed at [facility], [place] [address] [place], [place] [address]   Blood culture (routine x 2)     Status: None (Preliminary result)   Collection Time: 03/10/21  6:28 PM   Specimen: BLOOD  Result Value Ref Range Status   Specimen Description   Final    BLOOD RIGHT ANTECUBITAL Performed at [facility] [address] [place], [place] [address]    Special Requests   Final    BOTTLES DRAWN AEROBIC AND ANAEROBIC Blood Culture adequate volume Performed at [facility] [address], [place], [place] [address]    Culture   Final    NO GROWTH < 12 HOURS Performed at [facility] [address] [place], [place] [address]    Report Status PENDING  Incomplete  Expectorated Sputum Assessment w Gram Stain, Rflx to Resp Cult     Status: None   Collection Time: 03/11/21  2:46 AM   Specimen: Expectorated Sputum  Result Value Ref Range Status   Specimen Description EXPECTORATED SPUTUM  Final  Special Requests Immunocompromised  Final   Sputum evaluation   Final    THIS SPECIMEN IS ACCEPTABLE FOR SPUTUM CULTURE Performed at [facility] [address], [place], [place] [address]    Report Status 03/11/2021 FINAL  Final  Culture, Respiratory w Gram Stain     Status: None (Preliminary result)   Collection Time:  03/11/21  2:46 AM  Result Value Ref Range Status   Specimen Description   Final    EXPECTORATED SPUTUM Performed at [facility] [address], [place], [place] [address]    Special Requests   Final    Immunocompromised Reflexed from [phone] Performed at [facility], [place] [address] [place], [place] [address]    Gram Stain   Final    RARE WBC PRESENT,BOTH PMN AND MONONUCLEAR RARE GRAM POSITIVE COCCI IN PAIRS FEW GRAM VARIABLE ROD Performed at [facility], [place] [address], [place], [place] [address]    Culture PENDING  Incomplete   Report Status PENDING  Incomplete    Alcide Evener, [facility] [phone] pager  03/11/2021, 4:54 PM

## 2021-03-12 DIAGNOSIS — J189 Pneumonia, unspecified organism: Secondary | ICD-10-CM | POA: Diagnosis not present

## 2021-03-12 DIAGNOSIS — J9601 Acute respiratory failure with hypoxia: Secondary | ICD-10-CM | POA: Diagnosis not present

## 2021-03-12 DIAGNOSIS — B20 Human immunodeficiency virus [HIV] disease: Secondary | ICD-10-CM | POA: Diagnosis not present

## 2021-03-12 LAB — CBC
HCT: 37.6 % (ref 36.0–46.0)
Hemoglobin: 12.2 g/dL (ref 12.0–15.0)
MCH: 32.7 pg (ref 26.0–34.0)
MCHC: 32.4 g/dL (ref 30.0–36.0)
MCV: 100.8 fL — ABNORMAL HIGH (ref 80.0–100.0)
Platelets: 81 10*3/uL — ABNORMAL LOW (ref 150–400)
RBC: 3.73 MIL/uL — ABNORMAL LOW (ref 3.87–5.11)
RDW: 13.1 % (ref 11.5–15.5)
WBC: 5.7 10*3/uL (ref 4.0–10.5)
nRBC: 0 % (ref 0.0–0.2)

## 2021-03-12 LAB — BASIC METABOLIC PANEL
Anion gap: 6 (ref 5–15)
BUN: 13 mg/dL (ref 8–23)
CO2: 28 mmol/L (ref 22–32)
Calcium: 9 mg/dL (ref 8.9–10.3)
Chloride: 102 mmol/L (ref 98–111)
Creatinine, Ser: 0.71 mg/dL (ref 0.44–1.00)
GFR, Estimated: >60 mL/min (ref >60)
Glucose, Bld: 166 mg/dL — ABNORMAL HIGH (ref 70–99)
Potassium: 4.3 mmol/L (ref 3.5–5.1)
Sodium: 136 mmol/L (ref 135–145)

## 2021-03-12 LAB — LACTATE DEHYDROGENASE: LDH: 106 U/L (ref 98–192)

## 2021-03-12 LAB — MAGNESIUM: Magnesium: 2.1 mg/dL (ref 1.7–2.4)

## 2021-03-12 MED ORDER — IPRATROPIUM-ALBUTEROL 0.5-2.5 (3) MG/3ML IN SOLN
3.0000 mL | Freq: Two times a day (BID) | RESPIRATORY_TRACT | Status: DC
  Administered 2021-03-12 – 2021-03-14 (×3): 3 mL via RESPIRATORY_TRACT
  Filled 2021-03-12 (×4): qty 3

## 2021-03-12 NOTE — Plan of Care (Signed)
  Problem: Education: Goal: Knowledge of General Education information will improve Description: Including pain rating scale, medication(s)/side effects and non-pharmacologic comfort measures Outcome: Progressing   Problem: Clinical Measurements: Goal: Ability to maintain clinical measurements within normal limits will improve Outcome: Progressing Goal: Will remain free from infection Outcome: Progressing Goal: Diagnostic test results will improve Outcome: Progressing   Problem: Activity: Goal: Risk for activity intolerance will decrease Outcome: Progressing   Problem: Coping: Goal: Level of anxiety will decrease Outcome: Progressing   Problem: Activity: Goal: Ability to tolerate increased activity will improve Outcome: Progressing   Problem: Respiratory: Goal: Ability to maintain adequate ventilation will improve Outcome: Progressing Goal: Ability to maintain a clear airway will improve Outcome: Progressing

## 2021-03-12 NOTE — Progress Notes (Signed)
[facility]  Date of Admission:  03/10/2021           Reason for visit: Follow up on pneumonia   ASSESSMENT:    70 y.o. female admitted with:  Multifocal pneumonia: Suspected CAP 2/2 Strep pneumoniae with positive urinary antigen (although can be falsely positive).  Currently on Ceftriaxone for this and sputum cx with GPC in [place] and gram variable rods on Gram stain.  Also on TMP-SMX and prednisone for possible PCP given low CD4 count and hypoxia on admission.  She remains on about 2 liters via [place] today but also has been on room air at times.  She subjectively reports feeling better and has been afebrile.  PCP DFA smear is pending, but LDH is normal. Advanced HIV disease: Recently had not been adherent for about 6 weeks and viral load is pending.  Her CD4 count is low at 116 but % is relatively preserved at baseline of 16%.  Possible that CD4 count is acutely low related to bacterial pneumonia given percentage.  History of treated HCV Cirrhosis  RECOMMENDATIONS:    Continue ceftriaxone Follow up sputum cultures Follow up PCP DFA.  Suspicion for PCP is lower but favor continuing Bactrim until this returns since it should be back in the next couple days Continue Symtuza Will follow.     Active Problems:   Human immunodeficiency virus (HIV) disease ([place])   Tobacco abuse   Anemia   Opioid dependence ([place])   Thoracic aortic aneurysm without rupture ([place])   CAP (community acquired pneumonia)   Acute respiratory failure with hypoxia ([facility])   Hyperkalemia   Other cirrhosis of liver (H[facility])   Thrombocytopenia ([facility])    MEDICATIONS:    Scheduled Meds: . budesonide (PULMICORT) nebulizer solution  0.5 mg Nebulization BID  . Darunavir-Cobicistat-Emtricitabine-Tenofovir Alafenamide  1 tablet Oral Q breakfast  . docusate sodium  100 mg Oral BID  . ipratropium-albuterol  3 mL Nebulization BID  . methadone  30 mg Oral Daily  . predniSONE  40 mg Oral BID WC   . rosuvastatin  5 mg Oral Daily  . senna  1 tablet Oral BID  . sodium chloride flush  3 mL Intravenous Q12H  . sodium zirconium cyclosilicate  10 g Oral Once   Continuous Infusions: . sodium chloride    . cefTRIAXone (ROCEPHIN)  IV 2 g (03/11/21 2114)  . sulfamethoxazole-trimethoprim 320 mg (03/12/21 0950)   PRN Meds:.sodium chloride, acetaminophen, guaiFENesin-dextromethorphan, hydrALAZINE, HYDROcodone-acetaminophen, ipratropium-albuterol, metoprolol tartrate, senna-docusate, sodium chloride flush, traZODone  SUBJECTIVE:   24 hour events:  NO acute events  Feeling better this morning than yesterday.  Wants to know what her numbers look like.  Discussed CD4 count and that viral load is pending.  Review of Systems  All other systems reviewed and are negative.    OBJECTIVE:   Blood pressure (!) 108/57, pulse (!) 52, temperature 98 F (36.7 C), temperature source Oral, resp. rate 20, height '5\' 3"'$  (1.6 m), weight 61.2 kg, SpO2 94 %. Body mass index is 23.91 kg/m.  Physical Exam Constitutional:      General: She is not in acute distress.    Appearance: Normal appearance.  HENT:     Head: Normocephalic and atraumatic.  Eyes:     Extraocular Movements: Extraocular movements intact.     Conjunctiva/sclera: Conjunctivae normal.  Cardiovascular:     Rate and Rhythm: Normal rate and regular rhythm.  Pulmonary:     Effort: Pulmonary effort  is normal.     Breath sounds: Normal breath sounds.  Musculoskeletal:     Cervical back: Normal range of motion and neck supple.  Skin:    General: Skin is warm and dry.     Findings: No rash.  Neurological:     General: No focal deficit present.     Mental Status: She is alert and oriented to person, place, and time.  Psychiatric:        Mood and Affect: Mood normal.        Behavior: Behavior normal.     Lab Results: Lab Results  Component Value Date   WBC 5.7 03/12/2021   HGB 12.2 03/12/2021   HCT 37.6 03/12/2021   MCV 100.8  (H) 03/12/2021   PLT 81 (L) 03/12/2021    Lab Results  Component Value Date   NA 136 03/12/2021   K 4.3 03/12/2021   CO2 28 03/12/2021   GLUCOSE 166 (H) 03/12/2021   BUN 13 03/12/2021   CREATININE 0.71 03/12/2021   CALCIUM 9.0 03/12/2021   GFRNONAA >60 03/12/2021   GFRAA 92 06/30/2020    Lab Results  Component Value Date   ALT 15 03/11/2021   AST 25 03/11/2021   ALKPHOS 64 03/11/2021   BILITOT 1.2 03/11/2021       Component Value Date/Time   CRP 0.6 03/18/2020 0530    No results found for: ESRSEDRATE   I have reviewed the micro and lab results in Epic.  Imaging: CT HEAD WO CONTRAST (5MM)  Result Date: 03/10/2021 CLINICAL DATA:  Headache body aches EXAM: CT HEAD WITHOUT CONTRAST TECHNIQUE: Contiguous axial images were obtained from the base of the skull through the vertex without intravenous contrast. COMPARISON:  CT brain 05/04/2017 FINDINGS: Brain: No evidence of acute infarction, hemorrhage, hydrocephalus, extra-axial collection or mass lesion/mass effect. Vascular: No hyperdense vessel or unexpected calcification. Carotid vascular calcification Skull: Normal. Negative for fracture or focal lesion. Sinuses/Orbits: Patchy mucosal thickening in the sinuses Other: None IMPRESSION: Negative non contrasted CT appearance of the brain Electronically Signed   By: Donavan Foil M.D.   On: 03/10/2021 20:40   DG Chest Portable 1 View  Result Date: 03/10/2021 CLINICAL DATA:  Shortness of breath with cough EXAM: PORTABLE CHEST 1 VIEW COMPARISON:  06/04/2020, chest CT 01/23/2018, chest x-ray 09/09/2018 FINDINGS: Emphysematous disease. Mild diffuse reticular opacity likely due to chronic disease. No acute confluent airspace disease or pleural effusion. Stable cardiomediastinal silhouette with aortic atherosclerosis. IMPRESSION: No active disease.  Emphysema Electronically Signed   By: Donavan Foil M.D.   On: 03/10/2021 17:46   ECHOCARDIOGRAM COMPLETE  Result Date: 03/11/2021     ECHOCARDIOGRAM REPORT   Patient Name:   Ruth Stephenson Date of Exam: 03/11/2021 Medical Rec #:  [id number]       Height:       63.0 in Accession #:    [id number]      Weight:       135.0 lb Date of Birth:  08-08-1950       BSA:          1.636 m Patient Age:    26 years        BP:           90/59 mmHg Patient Gender: F               HR:           57 bpm. Exam Location:  Inpatient Procedure: 2D Echo Indications:  SOB  History:        Patient has no prior history of Echocardiogram examinations.  Sonographer:    MH Referring Phys: [id number] ANKIT CHIRAG AMIN IMPRESSIONS  1. Left ventricular ejection fraction, by estimation, is 60 to 65%. The left ventricle has normal function. The left ventricle has no regional wall motion abnormalities. There is mild concentric left ventricular hypertrophy. Left ventricular diastolic parameters were normal.  2. Right ventricular systolic function is normal. The right ventricular size is normal. There is mildly elevated pulmonary artery systolic pressure. The estimated right ventricular systolic pressure is 0000000 mmHg.  3. Left atrial size was mildly dilated.  4. The mitral valve is normal in structure. No evidence of mitral valve regurgitation.  5. The aortic valve is tricuspid. Aortic valve regurgitation is not visualized. No aortic stenosis is present.  6. The inferior vena cava is dilated in size with >50% respiratory variability, suggesting right atrial pressure of 8 mmHg. Comparison(s): No prior Echocardiogram. FINDINGS  Left Ventricle: Left ventricular ejection fraction, by estimation, is 60 to 65%. The left ventricle has normal function. The left ventricle has no regional wall motion abnormalities. The left ventricular internal cavity size was normal in size. There is  mild concentric left ventricular hypertrophy. Left ventricular diastolic parameters were normal. Right Ventricle: The right ventricular size is normal. No increase in right ventricular wall thickness. Right  ventricular systolic function is normal. There is mildly elevated pulmonary artery systolic pressure. The tricuspid regurgitant velocity is 3.04  m/s, and with an assumed right atrial pressure of 8 mmHg, the estimated right ventricular systolic pressure is 0000000 mmHg. Left Atrium: Left atrial size was mildly dilated. Right Atrium: Right atrial size was normal in size. Pericardium: There is no evidence of pericardial effusion. Mitral Valve: The mitral valve is normal in structure. No evidence of mitral valve regurgitation. Tricuspid Valve: The tricuspid valve is normal in structure. Tricuspid valve regurgitation is mild . No evidence of tricuspid stenosis. Aortic Valve: The aortic valve is tricuspid. Aortic valve regurgitation is not visualized. No aortic stenosis is present. Aortic valve mean gradient measures 9.0 mmHg. Aortic valve peak gradient measures 15.5 mmHg. Aortic valve area, by VTI measures 2.27  cm. Pulmonic Valve: The pulmonic valve was normal in structure. Pulmonic valve regurgitation is not visualized. No evidence of pulmonic stenosis. Aorta: The aortic root and ascending aorta are structurally normal, with no evidence of dilitation. Venous: The inferior vena cava is dilated in size with greater than 50% respiratory variability, suggesting right atrial pressure of 8 mmHg. IAS/Shunts: No atrial level shunt detected by color flow Doppler.  LEFT VENTRICLE PLAX 2D LVIDd:         4.40 cm     Diastology LVIDs:         2.70 cm     LV e' medial:    11.20 cm/s LV PW:         1.60 cm     LV E/e' medial:  8.0 LV IVS:        1.20 cm     LV e' lateral:   11.60 cm/s LVOT diam:     1.90 cm     LV E/e' lateral: 7.7 LV SV:         122 LV SV Index:   75 LVOT Area:     2.84 cm  LV Volumes (MOD) LV vol d, MOD A4C: 53.1 ml LV vol s, MOD A4C: 15.5 ml LV SV MOD A4C:  53.1 ml RIGHT VENTRICLE             IVC RV S prime:     15.10 cm/s  IVC diam: 3.00 cm TAPSE (M-mode): 3.1 cm LEFT ATRIUM             Index       RIGHT  ATRIUM           Index LA diam:        3.40 cm 2.08 cm/m  RA Area:     13.20 cm LA Vol (A2C):   63.5 ml 38.81 ml/m RA Volume:   30.30 ml  18.52 ml/m LA Vol (A4C):   61.1 ml 37.34 ml/m LA Biplane Vol: 64.3 ml 39.30 ml/m  AORTIC VALVE                    PULMONIC VALVE AV Area (Vmax):    2.35 cm     PV Vmax:       1.06 m/s AV Area (Vmean):   2.42 cm     PV Peak grad:  4.5 mmHg AV Area (VTI):     2.27 cm AV Vmax:           197.00 cm/s AV Vmean:          142.000 cm/s AV VTI:            0.539 m AV Peak Grad:      15.5 mmHg AV Mean Grad:      9.0 mmHg LVOT Vmax:         163.00 cm/s LVOT Vmean:        121.000 cm/s LVOT VTI:          0.431 m LVOT/AV VTI ratio: 0.80  AORTA Ao Root diam: 2.80 cm Ao Asc diam:  3.50 cm MITRAL VALVE               TRICUSPID VALVE MV Area (PHT): 3.24 cm    TR Peak grad:   37.0 mmHg MV E velocity: 89.50 cm/s  TR Vmax:        304.00 cm/s MV A velocity: 59.10 cm/s MV E/A ratio:  1.51        SHUNTS                            Systemic VTI:  0.43 m                            Systemic Diam: 1.90 cm Rudean Haskell MD Electronically signed by Rudean Haskell MD Signature Date/Time: 03/11/2021/9:38:54 PM    Final    CT Angio Chest/Abd/Pel for Dissection W and/or W/WO  Result Date: 03/10/2021 CLINICAL DATA:  Body aches, cough, nausea for 5 days, fever, history of thoracic aortic aneurysm, abdominal and back pain EXAM: CT ANGIOGRAPHY CHEST, ABDOMEN AND PELVIS TECHNIQUE: Non-contrast CT of the chest was initially obtained. Multidetector CT imaging through the chest, abdomen and pelvis was performed using the standard protocol during bolus administration of intravenous contrast. Multiplanar reconstructed images and MIPs were obtained and reviewed to evaluate the vascular anatomy. CONTRAST:  116m OMNIPAQUE IOHEXOL 350 MG/ML SOLN COMPARISON:  01/23/2018, 03/10/2021 FINDINGS: CTA CHEST FINDINGS Cardiovascular: No evidence of thoracic aortic aneurysm or dissection. Minimal atherosclerosis of  the aortic arch. Heart is unremarkable without pericardial effusion. Mediastinum/Nodes: Subcentimeter lymph nodes are seen within the subcarinal region and right hilum, likely reactive.  Thyroid, trachea, and esophagus are unremarkable. Lungs/Pleura: There is patchy airspace disease within the right upper and right lower lobes consistent with bronchopneumonia. No effusion or pneumothorax. Chronic left upper lobe scarring and background emphysema. Central airways are patent. Musculoskeletal: No acute or destructive bony lesions. Reconstructed images demonstrate no additional findings. Review of the MIP images confirms the above findings. CTA ABDOMEN AND PELVIS FINDINGS VASCULAR Aorta: Normal caliber aorta without aneurysm, dissection, vasculitis or significant stenosis. Mild diffuse atherosclerosis. Celiac: Patent without evidence of aneurysm, dissection, vasculitis or significant stenosis. Mild atherosclerosis at the origin without significant stenosis. SMA: Patent without evidence of aneurysm, dissection, vasculitis or significant stenosis. Renals: Both renal arteries are patent without evidence of aneurysm, dissection, vasculitis, fibromuscular dysplasia or significant stenosis. There is an accessory right renal artery supplying the lower pole which is widely patent. IMA: Patent without evidence of aneurysm, dissection, vasculitis or significant stenosis. Inflow: Patent without evidence of aneurysm, dissection, vasculitis or significant stenosis. Veins: No obvious venous abnormality within the limitations of this arterial phase study. Review of the MIP images confirms the above findings. NON-VASCULAR Hepatobiliary: Diffuse nodularity of the liver capsule compatible with cirrhosis. No focal parenchymal abnormality. The gallbladder is unremarkable. Pancreas: Unremarkable. No pancreatic ductal dilatation or surrounding inflammatory changes. Spleen: The spleen is enlarged measuring 13.5 x 13.8 x 7.2 cm. No focal  abnormalities. Adrenals/Urinary Tract: Kidneys enhance normally and symmetrically. No urinary tract calculi or obstructive uropathy. The adrenals and bladder are unremarkable. Stomach/Bowel: No bowel obstruction or ileus. Postsurgical changes from partial right hemicolectomy and reanastomosis. No bowel wall thickening or inflammatory change. Lymphatic: No pathologic adenopathy. Reproductive: Uterus and bilateral adnexa are unremarkable. Other: No free fluid or free gas. Small fat containing supraumbilical ventral hernia. No bowel herniation. Musculoskeletal: No acute or destructive bony lesions. Reconstructed images demonstrate no additional findings. Review of the MIP images confirms the above findings. IMPRESSION: 1. No evidence of thoracoabdominal aortic aneurysm or dissection. 2. Multifocal right-sided bronchopneumonia. 3. 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 4. Small fat containing supraumbilical ventral hernia. 5. Aortic Atherosclerosis (ICD10-I70.0) and Emphysema (ICD10-J43.9). Electronically Signed   By: Randa Ngo M.D.   On: 03/10/2021 20:47     Imaging independently reviewed in Epic.    [facility] [phone] pager 03/12/2021, 12:19 PM  I spent greater than 35 minutes with the patient including greater than 50% of time in face to face counsel of the patient and in coordination of their care.

## 2021-03-12 NOTE — Progress Notes (Addendum)
PROGRESS NOTE    LYNEA STAATS  [id number] DOB: 15-Feb-1951 DOA: 03/10/2021 PCP: System, Provider Not In   Brief Narrative:  70 year old with history of HIV, thoracic aortic aneurysm, substance abuse, prior colon cancer, hep C comes to the hospital with complaints of body ache, fatigue and cough.  Patient has not seen ID in at least a year and has been off meds for 6 months.  He started taking Bactrim and HIV meds only few days prior to admission.  Infectious disease consulted.   Assessment & Plan:   Active Problems:   Human immunodeficiency virus (HIV) disease ([place])   Tobacco abuse   Anemia   Opioid dependence ([place])   Thoracic aortic aneurysm without rupture ([place])   CAP (community acquired pneumonia)   Acute respiratory failure with hypoxia (H[facility])   Hyperkalemia   Other cirrhosis of liver (HCC)   Thrombocytopenia (HCC)   Community-acquired pneumonia, Multifocal right sided Acute Resp distress with Hypoxia - Continue Rocephin, prednisone and Bactrim.  PCP DFA-pending.  Supplemental oxygen, as needed bronchodilators.  ID following.  LDH 106.  Elevated BNP but echo shows normal EF - CTA shows right-sided multifocal bronchopneumonia  HIV -Supposed to be on outpatient Symtuza and follows with Dr. Johnnye Sima.  Last seen about 9 months ago.  Absolute CD4 count 116  Chronic Anemia, macrocytosis Thrombocytopenia - Closely monitor hemoglobin levels.  Iron studies relatively stable  Cirrhosis with history of hepatitis C - Previously been treated for hep C.  Hyperlipidemia - Daily statin  Chronic pain - On methadone   DVT prophylaxis: SCDs Start: 03/11/21 0010 Code Status: Full Family Communication:    Status is: Inpatient  Remains inpatient appropriate because:Inpatient level of care appropriate due to severity of illness.  Maintain hospital stay until all culture data is back.  Continue antibiotics.  Dispo: The patient is from: Home              Anticipated d/c is to:  Home              Patient currently is not medically stable to d/c.   Difficult to place patient No       Subjective: Feels better today.  Has mild coughing.  Review of Systems Otherwise negative except as per HPI, including: General: Denies fever, chills, night sweats or unintended weight loss. Resp: Denies cough, wheezing, shortness of breath. Cardiac: Denies chest pain, palpitations, orthopnea, paroxysmal nocturnal dyspnea. GI: Denies abdominal pain, nausea, vomiting, diarrhea or constipation GU: Denies dysuria, frequency, hesitancy or incontinence MS: Denies muscle aches, joint pain or swelling Neuro: Denies headache, neurologic deficits (focal weakness, numbness, tingling), abnormal gait Psych: Denies anxiety, depression, SI/HI/AVH Skin: Denies new rashes or lesions ID: Denies sick contacts, exotic exposures, travel  Examination:  Constitutional: Not in acute distress Respiratory: Mild rhonchi bilaterally Cardiovascular: Normal sinus rhythm, no rubs Abdomen: Nontender nondistended good bowel sounds Musculoskeletal: No edema noted Skin: No rashes seen Neurologic: CN 2-12 grossly intact.  And nonfocal Psychiatric: Normal judgment and insight. Alert and oriented x 3. Normal mood.  Objective: Vitals:   03/11/21 1745 03/11/21 1940 03/11/21 2110 03/12/21 0451  BP: 112/63  103/61 (!) 108/57  Pulse: (!) 54  (!) 52 (!) 52  Resp: '14  20 20  '$ Temp: 98.5 F (36.9 C)  98.3 F (36.8 C) 98 F (36.7 C)  TempSrc: Oral  Oral Oral  SpO2:  99% 93% 93%  Weight:      Height:        Intake/Output Summary (  Last 24 hours) at 03/12/2021 0921 Last data filed at 03/12/2021 0300 Gross per 24 hour  Intake 0 ml  Output 800 ml  Net -800 ml   Filed Weights   03/10/21 1437 03/11/21 1602  Weight: 61.2 kg 61.2 kg     Data Reviewed:   CBC: Recent Labs  Lab 03/10/21 1826 03/11/21 0554 03/12/21 0411  WBC 8.2 7.0 5.7  NEUTROABS 6.3 5.9  --   HGB 13.0 11.4* 12.2  HCT 40.5 35.3*  37.6  MCV 100.7* 102.9* 100.8*  PLT 87* 74* 81*   Basic Metabolic Panel: Recent Labs  Lab 03/10/21 1826 03/11/21 0007 03/11/21 0554 03/12/21 0411  NA 140  --  136 136  K 5.3*  --  4.1 4.3  CL 105  --  102 102  CO2 28  --  29 28  GLUCOSE 110*  --  144* 166*  BUN 11  --  10 13  CREATININE 0.83  --  0.68 0.71  CALCIUM 9.2  --  8.2* 9.0  MG  --  1.7 1.7 2.1  PHOS  --  4.1 2.7  --    GFR: Estimated Creatinine Clearance: 55.7 mL/min (by C-G formula based on SCr of 0.71 mg/dL). Liver Function Tests: Recent Labs  Lab 03/10/21 1826 03/11/21 0554  AST 51* 25  ALT 18 15  ALKPHOS 71 64  BILITOT 2.2* 1.2  PROT 8.4* 7.2  ALBUMIN 3.5 3.0*   Recent Labs  Lab 03/10/21 1826  LIPASE 30   No results for input(s): AMMONIA in the last 168 hours. Coagulation Profile: No results for input(s): INR, PROTIME in the last 168 hours. Cardiac Enzymes: Recent Labs  Lab 03/11/21 0007  CKTOTAL 50   BNP (last 3 results) No results for input(s): PROBNP in the last 8760 hours. HbA1C: No results for input(s): HGBA1C in the last 72 hours. CBG: No results for input(s): GLUCAP in the last 168 hours. Lipid Profile: No results for input(s): CHOL, HDL, LDLCALC, TRIG, CHOLHDL, LDLDIRECT in the last 72 hours. Thyroid Function Tests: Recent Labs    03/11/21 0554  TSH 1.308   Anemia Panel: Recent Labs    03/11/21 0554  VITAMINB12 383  FOLATE 12.9  FERRITIN 119  TIBC 274  IRON 42  RETICCTPCT 2.0   Sepsis Labs: Recent Labs  Lab 03/10/21 1826 03/11/21 0554  PROCALCITON  --  0.12  LATICACIDVEN 1.5  --     Recent Results (from the past 240 hour(s))  Resp Panel by RT-PCR (Flu A&B, Covid) Nasopharyngeal Swab     Status: None   Collection Time: 03/10/21  5:48 PM   Specimen: Nasopharyngeal Swab; Nasopharyngeal(NP) swabs in vial transport medium  Result Value Ref Range Status   SARS Coronavirus 2 by RT PCR NEGATIVE NEGATIVE Final    Comment: (NOTE) SARS-CoV-2 target nucleic acids are  NOT DETECTED.  The SARS-CoV-2 RNA is generally detectable in upper respiratory specimens during the acute phase of infection. The lowest concentration of SARS-CoV-2 viral copies this assay can detect is 138 copies/mL. A negative result does not preclude SARS-Cov-2 infection and should not be used as the sole basis for treatment or other patient management decisions. A negative result may occur with  improper specimen collection/handling, submission of specimen other than nasopharyngeal swab, presence of viral mutation(s) within the areas targeted by this assay, and inadequate number of viral copies(<138 copies/mL). A negative result must be combined with clinical observations, patient history, and epidemiological information. The expected result is Negative.  Fact Sheet for Patients:  EntrepreneurPulse.com.au  Fact Sheet for Healthcare Providers:  IncredibleEmployment.be  This test is no t yet approved or cleared by the [place] FDA and  has been authorized for detection and/or diagnosis of SARS-CoV-2 by FDA under an Emergency Use Authorization (EUA). This EUA will remain  in effect (meaning this test can be used) for the duration of the COVID-19 declaration under Section 564(b)(1) of the Act, 21 U.S.C.section 360bbb-3(b)(1), unless the authorization is terminated  or revoked sooner.       Influenza A by PCR NEGATIVE NEGATIVE Final   Influenza B by PCR NEGATIVE NEGATIVE Final    Comment: (NOTE) The Xpert Xpress SARS-CoV-2/FLU/RSV plus assay is intended as an aid in the diagnosis of influenza from Nasopharyngeal swab specimens and should not be used as a sole basis for treatment. Nasal washings and aspirates are unacceptable for Xpert Xpress SARS-CoV-2/FLU/RSV testing.  Fact Sheet for Patients: EntrepreneurPulse.com.au  Fact Sheet for Healthcare Providers: IncredibleEmployment.be  This test is not  yet approved or cleared by the [place] FDA and has been authorized for detection and/or diagnosis of SARS-CoV-2 by FDA under an Emergency Use Authorization (EUA). This EUA will remain in effect (meaning this test can be used) for the duration of the COVID-19 declaration under Section 564(b)(1) of the Act, 21 U.S.C. section 360bbb-3(b)(1), unless the authorization is terminated or revoked.  Performed at [facility], [place] [address], [place], [place] [address]   Blood culture (routine x 2)     Status: None (Preliminary result)   Collection Time: 03/10/21  6:28 PM   Specimen: BLOOD  Result Value Ref Range Status   Specimen Description   Final    BLOOD RIGHT ANTECUBITAL Performed at [facility] [address] [place], [place] [address]    Special Requests   Final    BOTTLES DRAWN AEROBIC AND ANAEROBIC Blood Culture adequate volume Performed at [facility] [address], [place], [place] [address]    Culture   Final    NO GROWTH 2 DAYS Performed at [facility] [address], [place], [place] [address]    Report Status PENDING  Incomplete  Expectorated Sputum Assessment w Gram Stain, Rflx to Resp Cult     Status: None   Collection Time: 03/11/21  2:46 AM   Specimen: Expectorated Sputum  Result Value Ref Range Status   Specimen Description EXPECTORATED SPUTUM  Final   Special Requests Immunocompromised  Final   Sputum evaluation   Final    THIS SPECIMEN IS ACCEPTABLE FOR SPUTUM CULTURE Performed at [facility], [place] [address] [place], [place] [address]    Report Status 03/11/2021 FINAL  Final  Culture, Respiratory w Gram Stain     Status: None (Preliminary result)   Collection Time: 03/11/21  2:46 AM  Result Value Ref Range Status   Specimen Description   Final    EXPECTORATED SPUTUM Performed at [facility] [address] [place], [place] [address]     Special Requests   Final    Immunocompromised Reflexed from [phone] Performed at [facility], [place] [address], [place], [place] [address]    Gram Stain   Final    RARE WBC PRESENT,BOTH PMN AND MONONUCLEAR RARE GRAM POSITIVE COCCI IN PAIRS FEW GRAM VARIABLE ROD    Culture   Final    CULTURE REINCUBATED FOR BETTER GROWTH Performed at [facility], [place]  [address] [place], [place] [address]    Report Status PENDING  Incomplete         Radiology Studies: CT HEAD WO CONTRAST (5MM)  Result Date: 03/10/2021 CLINICAL DATA:  Headache body aches EXAM: CT HEAD WITHOUT CONTRAST TECHNIQUE: Contiguous axial images were obtained from the base of the skull through the vertex without intravenous contrast. COMPARISON:  CT brain 05/04/2017 FINDINGS: Brain: No evidence of acute infarction, hemorrhage, hydrocephalus, extra-axial collection or mass lesion/mass effect. Vascular: No hyperdense vessel or unexpected calcification. Carotid vascular calcification Skull: Normal. Negative for fracture or focal lesion. Sinuses/Orbits: Patchy mucosal thickening in the sinuses Other: None IMPRESSION: Negative non contrasted CT appearance of the brain Electronically Signed   By: Donavan Foil M.D.   On: 03/10/2021 20:40   DG Chest Portable 1 View  Result Date: 03/10/2021 CLINICAL DATA:  Shortness of breath with cough EXAM: PORTABLE CHEST 1 VIEW COMPARISON:  06/04/2020, chest CT 01/23/2018, chest x-ray 09/09/2018 FINDINGS: Emphysematous disease. Mild diffuse reticular opacity likely due to chronic disease. No acute confluent airspace disease or pleural effusion. Stable cardiomediastinal silhouette with aortic atherosclerosis. IMPRESSION: No active disease.  Emphysema Electronically Signed   By: Donavan Foil M.D.   On: 03/10/2021 17:46   ECHOCARDIOGRAM COMPLETE  Result Date: 03/11/2021    ECHOCARDIOGRAM REPORT   Patient Name:   ALEEA KEEF Date of Exam: 03/11/2021 Medical Rec #:  [id number]        Height:       63.0 in Accession #:    [id number]      Weight:       135.0 lb Date of Birth:  Jun 02, 1951       BSA:          1.636 m Patient Age:    76 years        BP:           90/59 mmHg Patient Gender: F               HR:           57 bpm. Exam Location:  Inpatient Procedure: 2D Echo Indications:    SOB  History:        Patient has no prior history of Echocardiogram examinations.  Sonographer:    MH Referring Phys: [id number] Alexxis Mackert CHIRAG Woodrow Dulski IMPRESSIONS  1. Left ventricular ejection fraction, by estimation, is 60 to 65%. The left ventricle has normal function. The left ventricle has no regional wall motion abnormalities. There is mild concentric left ventricular hypertrophy. Left ventricular diastolic parameters were normal.  2. Right ventricular systolic function is normal. The right ventricular size is normal. There is mildly elevated pulmonary artery systolic pressure. The estimated right ventricular systolic pressure is 0000000 mmHg.  3. Left atrial size was mildly dilated.  4. The mitral valve is normal in structure. No evidence of mitral valve regurgitation.  5. The aortic valve is tricuspid. Aortic valve regurgitation is not visualized. No aortic stenosis is present.  6. The inferior vena cava is dilated in size with >50% respiratory variability, suggesting right atrial pressure of 8 mmHg. Comparison(s): No prior Echocardiogram. FINDINGS  Left Ventricle: Left ventricular ejection fraction, by estimation, is 60 to 65%. The left ventricle has normal function. The left ventricle has no regional wall motion abnormalities. The left ventricular internal cavity size was normal in size. There is  mild concentric left ventricular hypertrophy. Left ventricular diastolic parameters were normal. Right Ventricle: The right ventricular size is normal. No increase in  right ventricular wall thickness. Right ventricular systolic function is normal. There is mildly elevated pulmonary artery systolic pressure. The tricuspid  regurgitant velocity is 3.04  m/s, and with an assumed right atrial pressure of 8 mmHg, the estimated right ventricular systolic pressure is 0000000 mmHg. Left Atrium: Left atrial size was mildly dilated. Right Atrium: Right atrial size was normal in size. Pericardium: There is no evidence of pericardial effusion. Mitral Valve: The mitral valve is normal in structure. No evidence of mitral valve regurgitation. Tricuspid Valve: The tricuspid valve is normal in structure. Tricuspid valve regurgitation is mild . No evidence of tricuspid stenosis. Aortic Valve: The aortic valve is tricuspid. Aortic valve regurgitation is not visualized. No aortic stenosis is present. Aortic valve mean gradient measures 9.0 mmHg. Aortic valve peak gradient measures 15.5 mmHg. Aortic valve area, by VTI measures 2.27  cm. Pulmonic Valve: The pulmonic valve was normal in structure. Pulmonic valve regurgitation is not visualized. No evidence of pulmonic stenosis. Aorta: The aortic root and ascending aorta are structurally normal, with no evidence of dilitation. Venous: The inferior vena cava is dilated in size with greater than 50% respiratory variability, suggesting right atrial pressure of 8 mmHg. IAS/Shunts: No atrial level shunt detected by color flow Doppler.  LEFT VENTRICLE PLAX 2D LVIDd:         4.40 cm     Diastology LVIDs:         2.70 cm     LV e' medial:    11.20 cm/s LV PW:         1.60 cm     LV E/e' medial:  8.0 LV IVS:        1.20 cm     LV e' lateral:   11.60 cm/s LVOT diam:     1.90 cm     LV E/e' lateral: 7.7 LV SV:         122 LV SV Index:   75 LVOT Area:     2.84 cm  LV Volumes (MOD) LV vol d, MOD A4C: 53.1 ml LV vol s, MOD A4C: 15.5 ml LV SV MOD A4C:     53.1 ml RIGHT VENTRICLE             IVC RV S prime:     15.10 cm/s  IVC diam: 3.00 cm TAPSE (M-mode): 3.1 cm LEFT ATRIUM             Index       RIGHT ATRIUM           Index LA diam:        3.40 cm 2.08 cm/m  RA Area:     13.20 cm LA Vol (A2C):   63.5 ml 38.81 ml/m RA  Volume:   30.30 ml  18.52 ml/m LA Vol (A4C):   61.1 ml 37.34 ml/m LA Biplane Vol: 64.3 ml 39.30 ml/m  AORTIC VALVE                    PULMONIC VALVE AV Area (Vmax):    2.35 cm     PV Vmax:       1.06 m/s AV Area (Vmean):   2.42 cm     PV Peak grad:  4.5 mmHg AV Area (VTI):     2.27 cm AV Vmax:           197.00 cm/s AV Vmean:          142.000 cm/s AV VTI:  0.539 m AV Peak Grad:      15.5 mmHg AV Mean Grad:      9.0 mmHg LVOT Vmax:         163.00 cm/s LVOT Vmean:        121.000 cm/s LVOT VTI:          0.431 m LVOT/AV VTI ratio: 0.80  AORTA Ao Root diam: 2.80 cm Ao Asc diam:  3.50 cm MITRAL VALVE               TRICUSPID VALVE MV Area (PHT): 3.24 cm    TR Peak grad:   37.0 mmHg MV E velocity: 89.50 cm/s  TR Vmax:        304.00 cm/s MV A velocity: 59.10 cm/s MV E/A ratio:  1.51        SHUNTS                            Systemic VTI:  0.43 m                            Systemic Diam: 1.90 cm Rudean Haskell MD Electronically signed by Rudean Haskell MD Signature Date/Time: 03/11/2021/9:38:54 PM    Final    CT Angio Chest/Abd/Pel for Dissection W and/or W/WO  Result Date: 03/10/2021 CLINICAL DATA:  Body aches, cough, nausea for 5 days, fever, history of thoracic aortic aneurysm, abdominal and back pain EXAM: CT ANGIOGRAPHY CHEST, ABDOMEN AND PELVIS TECHNIQUE: Non-contrast CT of the chest was initially obtained. Multidetector CT imaging through the chest, abdomen and pelvis was performed using the standard protocol during bolus administration of intravenous contrast. Multiplanar reconstructed images and MIPs were obtained and reviewed to evaluate the vascular anatomy. CONTRAST:  115m OMNIPAQUE IOHEXOL 350 MG/ML SOLN COMPARISON:  01/23/2018, 03/10/2021 FINDINGS: CTA CHEST FINDINGS Cardiovascular: No evidence of thoracic aortic aneurysm or dissection. Minimal atherosclerosis of the aortic arch. Heart is unremarkable without pericardial effusion. Mediastinum/Nodes: Subcentimeter lymph nodes are  seen within the subcarinal region and right hilum, likely reactive. Thyroid, trachea, and esophagus are unremarkable. Lungs/Pleura: There is patchy airspace disease within the right upper and right lower lobes consistent with bronchopneumonia. No effusion or pneumothorax. Chronic left upper lobe scarring and background emphysema. Central airways are patent. Musculoskeletal: No acute or destructive bony lesions. Reconstructed images demonstrate no additional findings. Review of the MIP images confirms the above findings. CTA ABDOMEN AND PELVIS FINDINGS VASCULAR Aorta: Normal caliber aorta without aneurysm, dissection, vasculitis or significant stenosis. Mild diffuse atherosclerosis. Celiac: Patent without evidence of aneurysm, dissection, vasculitis or significant stenosis. Mild atherosclerosis at the origin without significant stenosis. SMA: Patent without evidence of aneurysm, dissection, vasculitis or significant stenosis. Renals: Both renal arteries are patent without evidence of aneurysm, dissection, vasculitis, fibromuscular dysplasia or significant stenosis. There is an accessory right renal artery supplying the lower pole which is widely patent. IMA: Patent without evidence of aneurysm, dissection, vasculitis or significant stenosis. Inflow: Patent without evidence of aneurysm, dissection, vasculitis or significant stenosis. Veins: No obvious venous abnormality within the limitations of this arterial phase study. Review of the MIP images confirms the above findings. NON-VASCULAR Hepatobiliary: Diffuse nodularity of the liver capsule compatible with cirrhosis. No focal parenchymal abnormality. The gallbladder is unremarkable. Pancreas: Unremarkable. No pancreatic ductal dilatation or surrounding inflammatory changes. Spleen: The spleen is enlarged measuring 13.5 x 13.8 x 7.2 cm. No focal abnormalities. Adrenals/Urinary Tract: Kidneys enhance normally and symmetrically.  No urinary tract calculi or obstructive  uropathy. The adrenals and bladder are unremarkable. Stomach/Bowel: No bowel obstruction or ileus. Postsurgical changes from partial right hemicolectomy and reanastomosis. No bowel wall thickening or inflammatory change. Lymphatic: No pathologic adenopathy. Reproductive: Uterus and bilateral adnexa are unremarkable. Other: No free fluid or free gas. Small fat containing supraumbilical ventral hernia. No bowel herniation. Musculoskeletal: No acute or destructive bony lesions. Reconstructed images demonstrate no additional findings. Review of the MIP images confirms the above findings. IMPRESSION: 1. No evidence of thoracoabdominal aortic aneurysm or dissection. 2. Multifocal right-sided bronchopneumonia. 3. 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 4. Small fat containing supraumbilical ventral hernia. 5. Aortic Atherosclerosis (ICD10-I70.0) and Emphysema (ICD10-J43.9). Electronically Signed   By: Randa Ngo M.D.   On: 03/10/2021 20:47        Scheduled Meds:  budesonide (PULMICORT) nebulizer solution  0.5 mg Nebulization BID   Darunavir-Cobicistat-Emtricitabine-Tenofovir Alafenamide  1 tablet Oral Q breakfast   docusate sodium  100 mg Oral BID   ipratropium-albuterol  3 mL Nebulization TID   methadone  30 mg Oral Daily   predniSONE  40 mg Oral BID WC   rosuvastatin  5 mg Oral Daily   senna  1 tablet Oral BID   sodium chloride flush  3 mL Intravenous Q12H   sodium zirconium cyclosilicate  10 g Oral Once   Continuous Infusions:  sodium chloride     cefTRIAXone (ROCEPHIN)  IV 2 g (03/11/21 2114)   sulfamethoxazole-trimethoprim 320 mg (03/12/21 0146)     LOS: 2 days   Time spent= 35 mins    Kelechi Astarita Arsenio Loader, MD [facility]  If 7PM-7AM, please contact night-coverage  03/12/2021, 9:21 AM

## 2021-03-13 DIAGNOSIS — J9601 Acute respiratory failure with hypoxia: Secondary | ICD-10-CM | POA: Diagnosis not present

## 2021-03-13 DIAGNOSIS — E875 Hyperkalemia: Secondary | ICD-10-CM | POA: Diagnosis not present

## 2021-03-13 DIAGNOSIS — J189 Pneumonia, unspecified organism: Secondary | ICD-10-CM | POA: Diagnosis not present

## 2021-03-13 DIAGNOSIS — D649 Anemia, unspecified: Secondary | ICD-10-CM | POA: Diagnosis not present

## 2021-03-13 LAB — CBC
HCT: 39.1 % (ref 36.0–46.0)
Hemoglobin: 12.9 g/dL (ref 12.0–15.0)
MCH: 33.3 pg (ref 26.0–34.0)
MCHC: 33 g/dL (ref 30.0–36.0)
MCV: 101 fL — ABNORMAL HIGH (ref 80.0–100.0)
Platelets: 86 K/uL — ABNORMAL LOW (ref 150–400)
RBC: 3.87 MIL/uL (ref 3.87–5.11)
RDW: 13.3 % (ref 11.5–15.5)
WBC: 5.6 K/uL (ref 4.0–10.5)
nRBC: 0 % (ref 0.0–0.2)

## 2021-03-13 LAB — HIV-1 RNA QUANT-NO REFLEX-BLD
HIV 1 RNA Quant: <20 {copies}/mL
LOG10 HIV-1 RNA: UNDETERMINED {Log_copies}/mL

## 2021-03-13 LAB — BASIC METABOLIC PANEL
Anion gap: 7 (ref 5–15)
BUN: 12 mg/dL (ref 8–23)
CO2: 29 mmol/L (ref 22–32)
Calcium: 9.4 mg/dL (ref 8.9–10.3)
Chloride: 105 mmol/L (ref 98–111)
Creatinine, Ser: 0.55 mg/dL (ref 0.44–1.00)
GFR, Estimated: >60 mL/min (ref >60)
Glucose, Bld: 114 mg/dL — ABNORMAL HIGH (ref 70–99)
Potassium: 4.9 mmol/L (ref 3.5–5.1)
Sodium: 141 mmol/L (ref 135–145)

## 2021-03-13 LAB — MAGNESIUM: Magnesium: 2 mg/dL (ref 1.7–2.4)

## 2021-03-13 MED ORDER — PSYLLIUM 95 % PO PACK
1.0000 | PACK | Freq: Every day | ORAL | Status: DC
  Administered 2021-03-13 – 2021-03-14 (×2): 1 via ORAL
  Filled 2021-03-13 (×2): qty 1

## 2021-03-13 NOTE — Plan of Care (Signed)
?  Problem: Clinical Measurements: ?Goal: Will remain free from infection ?Outcome: Progressing ?Goal: Diagnostic test results will improve ?Outcome: Progressing ?Goal: Respiratory complications will improve ?Outcome: Progressing ?  ?

## 2021-03-13 NOTE — Progress Notes (Signed)
Brief ID note:  Patient's PCP smear by DFA is negative and her LDH is normal.  Lower suspicion at this time for PCP pneumonia and will stop her Bactrim and prednisone as a result.  Continue ceftriaxone for CAP coverage and continue Symtuza for HIV.  Dr. Tommy Medal will be back tomorrow.    Raynelle Highland for [facility] 03/13/2021, 9:40 AM

## 2021-03-13 NOTE — Progress Notes (Addendum)
PROGRESS NOTE    Ruth Stephenson  [id number] DOB: Oct 30, 1950 DOA: 03/10/2021 PCP: System, Provider Not In    Brief Narrative:  Ruth Stephenson was admitted to the hospital with the working diagnosis of right lower lobe community acquired pneumonia, complicated with acute hypoxemic respiratory failure.   70 yo female with the past medical history of HIV, thoracic aneurysm, substance abuse, colon cancer, and hepatitis C who presented with 5 days of cough, nausea, fevers and body aches.  Patient has been off her HIV medicines for about 6 months. On her initial physical examination blood pressure 130/76, heart rate 83, respiratory rate 22, temperature 99.1, oxygen saturation 88% on room air, she had dry mucous membranes, her lungs had wheezing but no rales, heart S1-S2, present, rhythmic, soft abdomen, no lower extremity edema.  Sodium 140, potassium 5.3, chloride 105, bicarb 28, glucose 110, BUN 11, creatinine 0.83, AST 51, ALT 18, white count 8.2, hemoglobin 13.0, hematocrit 40.5, platelets 87. SARS COVID-19 negative. Urinalysis specific gravity 1.009, 0-5 white cells.  Streptococcus pneumoniae urinary antigen positive  Chest radiograph with right base predominantly interstitial infiltrate. Head CT negative for acute changes  CT chest/abdomen with right lower lobe infiltrate with air bronchogram.  Left upper lobe scarring. Cirrhosis with splenomegaly.  No aortic aneurysm or dissection.  Assessment & Plan:   Principal Problem:   CAP (community acquired pneumonia) Active Problems:   Human immunodeficiency virus (HIV) disease ([place])   Tobacco abuse   Anemia   Opioid dependence ([place])   Thoracic aortic aneurysm without rupture ([place])   Acute respiratory failure with hypoxia (HCC)   Hyperkalemia   Other cirrhosis of liver (HCC)   Thrombocytopenia ([place])   Acute hypoxemic respiratory failure due to right lower lobe community acquired pneumonia. Streptococcus pneumoniae.  Patient is  feeling better today but not yet back to baseline, continue to have congestion and productive cough.  Dyspnea has been improving, oxygen saturation this am is 93 on room air.  Wbc is 5.6 and she has been afebrile. Urinary streptococcal pneumoniae is positive.   Plan to continue antibiotic therapy with ceftriaxone IV, continue oxymetry monitoring. As needed bronchodilator therapy and antitussive agents.  Discontinue steroids and bactrim PCP smear by DFA was negative.  Airway clearing techniques with flutter valve and incentive spirometer.  2. HIV. Low CD4 count.  Continue with Symtuza per ID recommendations.   3. Chronic hepatitis C. Sp treatment as outpatient.  4. Dyslipidemia. Continue with statin therapy.   5. Chronic anemia. Hgb and hct stable, no indication for PRBC transfusion. Follow cell count as outpatient.   6. Chronic pain syndrome. Continue with methadone.   7. COPD. No clinical signs of exacerbation, continue with budesonide and as needed bronchodilator therapy.   8. Hyperkalemia. Patient sp one dose of sodium zirconium, today renal function is stable with serum cr at 0,55, K is 4,9 and serum bicarbonate at 29.   Status is: Inpatient  Remains inpatient appropriate because:Inpatient level of care appropriate due to severity of illness  Dispo: The patient is from: Home              Anticipated d/c is to: Home              Patient currently is not medically stable to d/c.   Difficult to place patient No  DVT prophylaxis: Enoxaparin   Code Status:    full  Family Communication:   No family at the bedside     Consultants:  ID  Antimicrobials:  Ceftriaxone     Subjective:  Patient with improvement in dyspnea, this am is on room air, her symptoms have improved but not yet back to baseline, continue to have productive cough and congestion.   Objective: Vitals:   03/12/21 2007 03/12/21 2053 03/13/21 0446 03/13/21 0722  BP:  114/72 (!) 103/55   Pulse:  (!)  54 (!) 53   Resp:  14 15   Temp:  97.9 F (36.6 C) 97.7 F (36.5 C)   TempSrc:  Oral Oral   SpO2: (!) 88% 94% 97% 93%  Weight:      Height:        Intake/Output Summary (Last 24 hours) at 03/13/2021 1243 Last data filed at 03/12/2021 2200 Gross per 24 hour  Intake 720 ml  Output --  Net 720 ml   Filed Weights   03/10/21 1437 03/11/21 1602  Weight: 61.2 kg 61.2 kg    Examination:   General: Not in pain or dyspnea  Neurology: Awake and alert, non focal  E ENT: mild pallor, no icterus, oral mucosa moist Cardiovascular: No JVD. S1-S2 present, rhythmic, no gallops, rubs, or murmurs. Trace bilateral lower extremity edema. Pulmonary: positive breath sounds bilaterally, adequate air movement, no wheezing, or rhonchi, faint rales at the right base.  Gastrointestinal. Abdomen soft and non tender Skin. No rashes Musculoskeletal: no joint deformities     Data Reviewed: I have personally reviewed following labs and imaging studies  CBC: Recent Labs  Lab 03/10/21 1826 03/11/21 0554 03/12/21 0411 03/13/21 0414  WBC 8.2 7.0 5.7 5.6  NEUTROABS 6.3 5.9  --   --   HGB 13.0 11.4* 12.2 12.9  HCT 40.5 35.3* 37.6 39.1  MCV 100.7* 102.9* 100.8* 101.0*  PLT 87* 74* 81* 86*   Basic Metabolic Panel: Recent Labs  Lab 03/10/21 1826 03/11/21 0007 03/11/21 0554 03/12/21 0411 03/13/21 0414  NA 140  --  136 136 141  K 5.3*  --  4.1 4.3 4.9  CL 105  --  102 102 105  CO2 28  --  '29 28 29  '$ GLUCOSE 110*  --  144* 166* 114*  BUN 11  --  '10 13 12  '$ CREATININE 0.83  --  0.68 0.71 0.55  CALCIUM 9.2  --  8.2* 9.0 9.4  MG  --  1.7 1.7 2.1 2.0  PHOS  --  4.1 2.7  --   --    GFR: Estimated Creatinine Clearance: 55.7 mL/min (by C-G formula based on SCr of 0.55 mg/dL). Liver Function Tests: Recent Labs  Lab 03/10/21 1826 03/11/21 0554  AST 51* 25  ALT 18 15  ALKPHOS 71 64  BILITOT 2.2* 1.2  PROT 8.4* 7.2  ALBUMIN 3.5 3.0*   Recent Labs  Lab 03/10/21 1826  LIPASE 30   No  results for input(s): AMMONIA in the last 168 hours. Coagulation Profile: No results for input(s): INR, PROTIME in the last 168 hours. Cardiac Enzymes: Recent Labs  Lab 03/11/21 0007  CKTOTAL 50   BNP (last 3 results) No results for input(s): PROBNP in the last 8760 hours. HbA1C: No results for input(s): HGBA1C in the last 72 hours. CBG: No results for input(s): GLUCAP in the last 168 hours. Lipid Profile: No results for input(s): CHOL, HDL, LDLCALC, TRIG, CHOLHDL, LDLDIRECT in the last 72 hours. Thyroid Function Tests: Recent Labs    03/11/21 0554  TSH 1.308   Anemia Panel: Recent Labs    03/11/21 0554  VITAMINB12 383  FOLATE 12.9  FERRITIN 119  TIBC 274  IRON 42  RETICCTPCT 2.0      Radiology Studies: I have reviewed all of the imaging during this hospital visit personally     Scheduled Meds:  budesonide (PULMICORT) nebulizer solution  0.5 mg Nebulization BID   Darunavir-Cobicistat-Emtricitabine-Tenofovir Alafenamide  1 tablet Oral Q breakfast   docusate sodium  100 mg Oral BID   ipratropium-albuterol  3 mL Nebulization BID   methadone  30 mg Oral Daily   psyllium  1 packet Oral Daily   rosuvastatin  5 mg Oral Daily   senna  1 tablet Oral BID   sodium chloride flush  3 mL Intravenous Q12H   sodium zirconium cyclosilicate  10 g Oral Once   Continuous Infusions:  sodium chloride     cefTRIAXone (ROCEPHIN)  IV 2 g (03/12/21 2150)     LOS: 3 days        Lakeshia Dohner Gerome Apley, MD

## 2021-03-14 ENCOUNTER — Other Ambulatory Visit (HOSPITAL_COMMUNITY): Payer: Self-pay

## 2021-03-14 ENCOUNTER — Other Ambulatory Visit: Payer: Self-pay | Admitting: Infectious Disease

## 2021-03-14 DIAGNOSIS — J189 Pneumonia, unspecified organism: Secondary | ICD-10-CM | POA: Diagnosis not present

## 2021-03-14 DIAGNOSIS — B20 Human immunodeficiency virus [HIV] disease: Secondary | ICD-10-CM | POA: Diagnosis not present

## 2021-03-14 DIAGNOSIS — R7989 Other specified abnormal findings of blood chemistry: Secondary | ICD-10-CM

## 2021-03-14 DIAGNOSIS — J9601 Acute respiratory failure with hypoxia: Secondary | ICD-10-CM | POA: Diagnosis not present

## 2021-03-14 DIAGNOSIS — J13 Pneumonia due to Streptococcus pneumoniae: Secondary | ICD-10-CM

## 2021-03-14 LAB — BASIC METABOLIC PANEL
Anion gap: 8 (ref 5–15)
BUN: 15 mg/dL (ref 8–23)
CO2: 29 mmol/L (ref 22–32)
Calcium: 9.6 mg/dL (ref 8.9–10.3)
Chloride: 102 mmol/L (ref 98–111)
Creatinine, Ser: 0.9 mg/dL (ref 0.44–1.00)
GFR, Estimated: >60 mL/min (ref >60)
Glucose, Bld: 94 mg/dL (ref 70–99)
Potassium: 4.1 mmol/L (ref 3.5–5.1)
Sodium: 139 mmol/L (ref 135–145)

## 2021-03-14 LAB — CBC
HCT: 39.8 % (ref 36.0–46.0)
Hemoglobin: 12.9 g/dL (ref 12.0–15.0)
MCH: 32.9 pg (ref 26.0–34.0)
MCHC: 32.4 g/dL (ref 30.0–36.0)
MCV: 101.5 fL — ABNORMAL HIGH (ref 80.0–100.0)
Platelets: 110 10*3/uL — ABNORMAL LOW (ref 150–400)
RBC: 3.92 MIL/uL (ref 3.87–5.11)
RDW: 13.5 % (ref 11.5–15.5)
WBC: 5.3 10*3/uL (ref 4.0–10.5)
nRBC: 0 % (ref 0.0–0.2)

## 2021-03-14 LAB — CULTURE, RESPIRATORY W GRAM STAIN

## 2021-03-14 LAB — MAGNESIUM: Magnesium: 1.9 mg/dL (ref 1.7–2.4)

## 2021-03-14 LAB — LEGIONELLA PNEUMOPHILA SEROGP 1 UR AG: L. pneumophila Serogp 1 Ur Ag: NEGATIVE

## 2021-03-14 MED ORDER — SULFAMETHOXAZOLE-TRIMETHOPRIM 400-80 MG PO TABS
1.0000 | ORAL_TABLET | Freq: Two times a day (BID) | ORAL | 0 refills | Status: DC
  Filled 2021-03-14: qty 60, 30d supply, fill #0

## 2021-03-14 MED ORDER — AMOXICILLIN 500 MG PO CAPS
500.0000 mg | ORAL_CAPSULE | Freq: Three times a day (TID) | ORAL | 0 refills | Status: AC
  Filled 2021-03-14: qty 9, 3d supply, fill #0

## 2021-03-14 MED ORDER — GUAIFENESIN-DM 100-10 MG/5ML PO SYRP
5.0000 mL | ORAL_SOLUTION | ORAL | 0 refills | Status: DC | PRN
  Filled 2021-03-14: qty 118, 4d supply, fill #0

## 2021-03-14 MED ORDER — SULFAMETHOXAZOLE-TRIMETHOPRIM 800-160 MG PO TABS
1.0000 | ORAL_TABLET | Freq: Every day | ORAL | 5 refills | Status: DC
  Filled 2021-03-14: qty 30, 30d supply, fill #0
  Filled 2021-04-04: qty 30, 30d supply, fill #1
  Filled 2021-06-01: qty 30, 30d supply, fill #2
  Filled 2021-06-23: qty 30, 30d supply, fill #3
  Filled 2021-07-22: qty 30, 30d supply, fill #4

## 2021-03-14 MED ORDER — DARUN-COBIC-EMTRICIT-TENOFAF 800-150-200-10 MG PO TABS
1.0000 | ORAL_TABLET | Freq: Every day | ORAL | 11 refills | Status: DC
Start: 2021-03-14 — End: 2021-09-16
  Filled 2021-03-14 – 2021-06-01 (×2): qty 30, 30d supply, fill #0
  Filled 2021-06-23: qty 30, 30d supply, fill #1
  Filled 2021-07-22: qty 30, 30d supply, fill #2

## 2021-03-14 MED ORDER — DARUN-COBIC-EMTRICIT-TENOFAF 800-150-200-10 MG PO TABS
1.0000 | ORAL_TABLET | Freq: Every day | ORAL | 0 refills | Status: DC
  Filled 2021-03-14: qty 30, 30d supply, fill #0

## 2021-03-14 MED ORDER — AMOXICILLIN 500 MG PO CAPS
500.0000 mg | ORAL_CAPSULE | Freq: Three times a day (TID) | ORAL | Status: DC
  Filled 2021-03-14: qty 1

## 2021-03-14 NOTE — Progress Notes (Signed)
Discharge instructions given to patient and all questions were answered.  

## 2021-03-14 NOTE — Progress Notes (Addendum)
Subjective: No new complaints, she is eager to go home   Antibiotics:  Anti-infectives (From admission, onward)    Start     Dose/Rate Route Frequency Ordered Stop   03/14/21 1400  amoxicillin (AMOXIL) capsule 500 mg        500 mg Oral Every 8 hours 03/14/21 0951 03/20/21 1359   03/12/21 0800  Darunavir-Cobicistat-Emtricitabine-Tenofovir Alafenamide (SYMTUZA) 800-150-200-10 MG TABS 1 tablet        1 tablet Oral Daily with breakfast 03/11/21 1708     03/11/21 2200  cefTRIAXone (ROCEPHIN) 2 g in sodium chloride 0.9 % 100 mL IVPB  Status:  Discontinued        2 g 200 mL/hr over 30 Minutes Intravenous Every 24 hours 03/11/21 0009 03/14/21 0951   03/11/21 2200  azithromycin (ZITHROMAX) 500 mg in sodium chloride 0.9 % 250 mL IVPB  Status:  Discontinued        500 mg 250 mL/hr over 60 Minutes Intravenous Every 24 hours 03/11/21 0009 03/11/21 1654   03/11/21 0800  sulfamethoxazole-trimethoprim (BACTRIM) 320 mg in dextrose 5 % 500 mL IVPB  Status:  Discontinued        320 mg 346.7 mL/hr over 90 Minutes Intravenous Every 8 hours 03/11/21 0012 03/13/21 0941   03/10/21 2230  sulfamethoxazole-trimethoprim (BACTRIM) 320 mg in dextrose 5 % 500 mL IVPB        320 mg 346.7 mL/hr over 90 Minutes Intravenous NOW 03/10/21 2200 03/11/21 0138   03/10/21 2215  sulfamethoxazole-trimethoprim (BACTRIM) 320 mg in dextrose 5 % 500 mL IVPB  Status:  Discontinued        320 mg 346.7 mL/hr over 90 Minutes Intravenous Every 8 hours 03/10/21 2200 03/11/21 0011   03/10/21 2200  cefTRIAXone (ROCEPHIN) 1 g in sodium chloride 0.9 % 100 mL IVPB        1 g 200 mL/hr over 30 Minutes Intravenous  Once 03/10/21 2148 03/11/21 0000   03/10/21 2200  azithromycin (ZITHROMAX) 500 mg in sodium chloride 0.9 % 250 mL IVPB        500 mg 250 mL/hr over 60 Minutes Intravenous  Once 03/10/21 2148 03/11/21 0247       Medications: Scheduled Meds:  amoxicillin  500 mg Oral Q8H   budesonide (PULMICORT) nebulizer  solution  0.5 mg Nebulization BID   Darunavir-Cobicistat-Emtricitabine-Tenofovir Alafenamide  1 tablet Oral Q breakfast   docusate sodium  100 mg Oral BID   ipratropium-albuterol  3 mL Nebulization BID   methadone  30 mg Oral Daily   psyllium  1 packet Oral Daily   rosuvastatin  5 mg Oral Daily   senna  1 tablet Oral BID   sodium chloride flush  3 mL Intravenous Q12H   sodium zirconium cyclosilicate  10 g Oral Once   Continuous Infusions:  sodium chloride     PRN Meds:.sodium chloride, acetaminophen, guaiFENesin-dextromethorphan, HYDROcodone-acetaminophen, ipratropium-albuterol, senna-docusate, sodium chloride flush, traZODone    Objective: Weight change:   Intake/Output Summary (Last 24 hours) at 03/14/2021 1030 Last data filed at 03/14/2021 1000 Gross per 24 hour  Intake 1020 ml  Output --  Net 1020 ml   Blood pressure (!) 120/108, pulse 62, temperature 98.1 F (36.7 C), temperature source Oral, resp. rate 16, height 5\' 3"  (1.6 m), weight 61.2 kg, SpO2 97 %. Temp:  [97.6 F (36.4 C)-98.2 F (36.8 C)] 98.1 F (36.7 C) (09/19 0924) Pulse Rate:  [54-70] 62 (09/19 0924) Resp:  [16-20] 16 (09/19  3419) BP: (110-157)/(63-108) 120/108 (09/19 0924) SpO2:  [91 %-97 %] 97 % (09/19 0924)  Physical Exam: Physical Exam Constitutional:      General: She is not in acute distress.    Appearance: She is well-developed. She is not diaphoretic.  HENT:     Head: Normocephalic and atraumatic.     Right Ear: External ear normal.     Left Ear: External ear normal.     Mouth/Throat:     Pharynx: No oropharyngeal exudate.  Eyes:     General: No scleral icterus.    Extraocular Movements: Extraocular movements intact.     Conjunctiva/sclera: Conjunctivae normal.     Pupils: Pupils are equal, round, and reactive to light.  Cardiovascular:     Rate and Rhythm: Normal rate and regular rhythm.     Heart sounds:    No friction rub.  Pulmonary:     Effort: Pulmonary effort is normal. No  respiratory distress.     Breath sounds: No wheezing.  Abdominal:     General: Bowel sounds are normal. There is no distension.     Palpations: Abdomen is soft.  Musculoskeletal:        General: No tenderness. Normal range of motion.  Lymphadenopathy:     Cervical: No cervical adenopathy.  Skin:    General: Skin is warm and dry.     Coloration: Skin is not pale.     Findings: No erythema or rash.  Neurological:     General: No focal deficit present.     Mental Status: She is alert and oriented to person, place, and time.     Motor: No abnormal muscle tone.     Coordination: Coordination normal.  Psychiatric:        Mood and Affect: Mood normal.        Behavior: Behavior normal.        Thought Content: Thought content normal.        Judgment: Judgment normal.     CBC:    BMET Recent Labs    03/13/21 0414 03/14/21 0352  NA 141 139  K 4.9 4.1  CL 105 102  CO2 29 29  GLUCOSE 114* 94  BUN 12 15  CREATININE 0.55 0.90  CALCIUM 9.4 9.6     Liver Panel  No results for input(s): PROT, ALBUMIN, AST, ALT, ALKPHOS, BILITOT, BILIDIR, IBILI in the last 72 hours.     Sedimentation Rate No results for input(s): ESRSEDRATE in the last 72 hours. C-Reactive Protein No results for input(s): CRP in the last 72 hours.  Micro Results: Recent Results (from the past 720 hour(s))  Resp Panel by RT-PCR (Flu A&B, Covid) Nasopharyngeal Swab     Status: None   Collection Time: 03/10/21  5:48 PM   Specimen: Nasopharyngeal Swab; Nasopharyngeal(NP) swabs in vial transport medium  Result Value Ref Range Status   SARS Coronavirus 2 by RT PCR NEGATIVE NEGATIVE Final    Comment: (NOTE) SARS-CoV-2 target nucleic acids are NOT DETECTED.  The SARS-CoV-2 RNA is generally detectable in upper respiratory specimens during the acute phase of infection. The lowest concentration of SARS-CoV-2 viral copies this assay can detect is 138 copies/mL. A negative result does not preclude  SARS-Cov-2 infection and should not be used as the sole basis for treatment or other patient management decisions. A negative result may occur with  improper specimen collection/handling, submission of specimen other than nasopharyngeal swab, presence of viral mutation(s) within the areas targeted by this assay, and  inadequate number of viral copies(<138 copies/mL). A negative result must be combined with clinical observations, patient history, and epidemiological information. The expected result is Negative.  Fact Sheet for Patients:  EntrepreneurPulse.com.au  Fact Sheet for Healthcare Providers:  IncredibleEmployment.be  This test is no t yet approved or cleared by the [place] FDA and  has been authorized for detection and/or diagnosis of SARS-CoV-2 by FDA under an Emergency Use Authorization (EUA). This EUA will remain  in effect (meaning this test can be used) for the duration of the COVID-19 declaration under Section 564(b)(1) of the Act, 21 U.S.C.section 360bbb-3(b)(1), unless the authorization is terminated  or revoked sooner.       Influenza A by PCR NEGATIVE NEGATIVE Final   Influenza B by PCR NEGATIVE NEGATIVE Final    Comment: (NOTE) The Xpert Xpress SARS-CoV-2/FLU/RSV plus assay is intended as an aid in the diagnosis of influenza from Nasopharyngeal swab specimens and should not be used as a sole basis for treatment. Nasal washings and aspirates are unacceptable for Xpert Xpress SARS-CoV-2/FLU/RSV testing.  Fact Sheet for Patients: EntrepreneurPulse.com.au  Fact Sheet for Healthcare Providers: IncredibleEmployment.be  This test is not yet approved or cleared by the [place] FDA and has been authorized for detection and/or diagnosis of SARS-CoV-2 by FDA under an Emergency Use Authorization (EUA). This EUA will remain in effect (meaning this test can be used) for the duration of  the COVID-19 declaration under Section 564(b)(1) of the Act, 21 U.S.C. section 360bbb-3(b)(1), unless the authorization is terminated or revoked.  Performed at [facility], [place] [address] [place], [place] [address]   Blood culture (routine x 2)     Status: None (Preliminary result)   Collection Time: 03/10/21  6:28 PM   Specimen: BLOOD  Result Value Ref Range Status   Specimen Description   Final    BLOOD RIGHT ANTECUBITAL Performed at [facility] [address], [place], [place] [address]    Special Requests   Final    BOTTLES DRAWN AEROBIC AND ANAEROBIC Blood Culture adequate volume Performed at [facility] [address], [place], [place] [address]    Culture   Final    NO GROWTH 4 DAYS Performed at [facility], [place] [address], [place], [place] [address]    Report Status PENDING  Incomplete  Expectorated Sputum Assessment w Gram Stain, Rflx to Resp Cult     Status: None   Collection Time: 03/11/21  2:46 AM   Specimen: Expectorated Sputum  Result Value Ref Range Status   Specimen Description EXPECTORATED SPUTUM  Final   Special Requests Immunocompromised  Final   Sputum evaluation   Final    THIS SPECIMEN IS ACCEPTABLE FOR SPUTUM CULTURE Performed at [facility], [place] [address], [place], [place] [address]    Report Status 03/11/2021 FINAL  Final  Culture, Respiratory w Gram Stain     Status: None   Collection Time: 03/11/21  2:46 AM  Result Value Ref Range Status   Specimen Description   Final    EXPECTORATED SPUTUM Performed at [facility], [place] [address] [place],  [address]    Special Requests   Final    Immunocompromised Reflexed from [phone] Performed at [facility], [place] [address], [place], [place] [address]    Gram Stain   Final    RARE WBC PRESENT,BOTH PMN AND MONONUCLEAR RARE GRAM POSITIVE COCCI IN PAIRS FEW GRAM VARIABLE  ROD Performed at [facility], [place] [address], [place], [place] [address]    Culture MODERATE STREPTOCOCCUS PNEUMONIAE  Final   Report Status 03/14/2021 FINAL  Final   Organism ID, Bacteria STREPTOCOCCUS PNEUMONIAE  Final      Susceptibility   Streptococcus pneumoniae - MIC*    ERYTHROMYCIN 2 RESISTANT Resistant     LEVOFLOXACIN 0.5 SENSITIVE Sensitive     VANCOMYCIN 0.5 SENSITIVE Sensitive     PENICILLIN (meningitis) <=0.06 SENSITIVE Sensitive     PENO - penicillin <=0.06      PENICILLIN (non-meningitis) <=0.06 SENSITIVE Sensitive     PENICILLIN (oral) <=0.06 SENSITIVE Sensitive     CEFTRIAXONE (non-meningitis) <=0.12 SENSITIVE Sensitive     CEFTRIAXONE (meningitis) <=0.12 SENSITIVE Sensitive     * MODERATE STREPTOCOCCUS PNEUMONIAE    Studies/Results: No results found.    Assessment/Plan:  INTERVAL HISTORY: He has grown Streptococcus pneumonia from sputum cultures and also had positive pneumococcal antigen and urine, narrowed to pneumonia and for pneumococcal antibiotis   Principal Problem:   CAP (community acquired pneumonia) Active Problems:   Human immunodeficiency virus (HIV) disease ([place])   Tobacco abuse   Anemia   Opioid dependence ([place])   Thoracic aortic aneurysm without rupture ([place])   Acute respiratory failure with hypoxia (HCC)   Hyperkalemia   Other cirrhosis of liver ([place])   Thrombocytopenia (HCC)    Ruth Stephenson is a 70 y.o. female with  HIV, AIDS nonadherence to medications admitted with right-sided multifocal pneumonia with positive pneumococcal antigen in urine and sputum cultures also growing Streptococcus pneumoniae.  Sputum was negative for PCP by DFA and she had a normal LDH.  #1 Pneumococcal pneumonia: She could be switched to amoxicillin to complete a total of 7 days of treatment   #2 HIV/AIDS: Her viral load on the 16th which I ordered and just now reviewed was actually undetectable.  CD4 could have been low simply from her severe  pneumonia  She is to continue to take [facility] and should be getting this via mailing from [place].  She also should continue to take Bactrim daily for PCP prevention.  I spent 38with the patient including  face to face counseling of the patient re her pneumococcal pneumonia, her HIV and need to be adherent to therapy personally reviewing CXR, CBC BMP sputum cultures, LDH along  review of medical records in preparation for the visit and during the visit and in coordination of her care.   BELICIA DIFATTA has an appointment on 03/24/2021 at 2 PM with Dr. Tommy Medal  The [facility] is located in the [facility] at  [address] in [place].  [address], which is located to the left of the elevators.  Phone: [phone]  Fax: [phone]  [email or web address]  She should arrive 15 to 30 minutes prior to the appointment.   LOS: 4 days   Alcide Evener 03/14/2021, 10:30 AM

## 2021-03-14 NOTE — Discharge Summary (Signed)
Physician Discharge Summary  Ruth Stephenson HQP:[id number] DOB: [birth year] DOA: 03/10/2021  PCP: System, Provider Not In  Admit date: 03/10/2021 Discharge date: 03/14/2021  Admitted From: Home Discharge disposition: Home   Code Status: Full Code  Diet Recommendation: Regular diet  Discharge Diagnosis:   Principal Problem:   Multifocal pneumonia Active Problems:   AIDS (acquired immune deficiency syndrome) (Ruth Stephenson)   Tobacco abuse   Anemia   Opioid dependence (Ruth Stephenson)   Thoracic aortic aneurysm without rupture (Ruth Stephenson)   Acute respiratory failure with hypoxia (HCC)   Hyperkalemia   Other cirrhosis of liver (HCC)   Thrombocytopenia (HCC)   Elevated LFTs   Pneumonia of right lung due to Streptococcus pneumoniae (Ruth Stephenson)   History of Present Illness / Brief narrative:  Patient is 70 yo female with the past medical history of HIV, thoracic aneurysm, substance abuse, colon cancer, and hepatitis C. Patient presented to the ED on 9/15 with complaint of 5 days of cough, nausea, fevers and body aches.  Patient has been off her HIV medicines for about 6 months.  On her initial physical examination blood pressure 130/76, heart rate 83, respiratory rate 22, temperature 99.1, oxygen saturation 88% on room air, she had dry mucous membranes, her lungs had wheezing but no rales, heart S1-S2, present, rhythmic, soft abdomen, no lower extremity edema. Sodium 140, potassium 5.3, chloride 105, bicarb 28, glucose 110, BUN 11, creatinine 0.83, AST 51, ALT 18, white count 8.2, hemoglobin 13.0, hematocrit 40.5, platelets 87. SARS COVID-19 negative. Urinalysis specific gravity 1.009, 0-5 white cells. CT chest/abdomen with right lower lobe infiltrate with air bronchogram.  Left upper lobe scarring. Cirrhosis with splenomegaly.  No aortic aneurysm or dissection. Admitted to hospitalist service. Streptococcus pneumoniae urinary antigen positive. ID consultation was obtained. See below for details.  Subjective:   Seen and examined this morning.  Pleasant elderly African-American female.  Sitting up in chair.  Not in distress.  No new symptoms.  Not on supplemental oxygen.  Feels ready to go home. ID follow-up this morning appreciated.  Hospital Course:  Acute hypoxemic respiratory failure Right lower lobe pneumonia due to Streptococcus pneumonia  -Presented with cough, body aches -Imaging with right lower lobe infiltrate, strep pneumoniae antigen positive in urine. -Started on IV Rocephin which she got for last 4 days. -Clinically improving.  Currently on room air.  WBC count improved. -Per ID recommendation, will discharge the patient on amoxicillin for next 3 days to complete 7-day course of antibiotics. Recent Labs  Lab 03/10/21 1826 03/11/21 0554 03/12/21 0411 03/13/21 0414 03/14/21 0352  WBC 8.2 7.0 5.7 5.6 5.3  LATICACIDVEN 1.5  --   --   --   --   PROCALCITON  --  0.12  --   --   --     HIV -Continue Symtuza and Bactrim.   -Follow-up with ID as an outpatient.  Chronic hep C -Outpatient treatment  Dyslipidemia -Continue with statin therapy.    Chronic anemia -Stable hemoglobin.   Recent Labs  Lab 03/10/21 1826 03/11/21 0554 03/12/21 0411 03/13/21 0414 03/14/21 0352  WBC 8.2 7.0 5.7 5.6 5.3  NEUTROABS 6.3 5.9  --   --   --   HGB 13.0 11.4* 12.2 12.9 12.9  HCT 40.5 35.3* 37.6 39.1 39.8  MCV 100.7* 102.9* 100.8* 101.0* 101.5*  PLT 87* 74* 81* 86* 110*   Chronic pain syndrome  -Continue with outpatient methadone treatment.   COPD.  -Clinically stable.   -continue with budesonide and as needed  bronchodilator therapy.    Hyperkalemia -Potassium level improved with 1 dose of Lokelma. Recent Labs  Lab 03/10/21 1826 03/11/21 0007 03/11/21 0554 03/12/21 0411 03/13/21 0414 03/14/21 0352  K 5.3*  --  4.1 4.3 4.9 4.1  MG  --  1.7 1.7 2.1 2.0 1.9  PHOS  --  4.1 2.7  --   --   --    Stable to discharge home today.   Allergies as of 03/14/2021   No Known  Allergies      Medication List     STOP taking these medications    furosemide 40 MG tablet Commonly known as: LASIX       TAKE these medications    amoxicillin 500 MG capsule Commonly known as: AMOXIL Take 1 capsule (500 mg total) by mouth every 8 (eight) hours for 3 days.   Darunavir-Cobicistat-Emtricitabine-Tenofovir Alafenamide 800-150-200-10 MG Tabs Commonly known as: SYMTUZA TAKE 1 TABLET BY MOUTH DAILY WITH BREAKFAST.   guaiFENesin-dextromethorphan 100-10 MG/5ML syrup Commonly known as: ROBITUSSIN DM Take 5 mLs by mouth every 4 (four) hours as needed for cough.   methadone 10 MG/5ML solution Commonly known as: DOLOPHINE Take 28 mg by mouth daily.   rosuvastatin 5 MG tablet Commonly known as: CRESTOR Take 1 tablet (5 mg total) by mouth daily.   sulfamethoxazole-trimethoprim 400-80 MG tablet Commonly known as: Bactrim Take 1 tablet by mouth 2 (two) times daily. What changed:  when to take this reasons to take this        Discharge Instructions:  Follow with Primary MD System, Provider Not In in 7 days   Get CBC/BMP checked in next visit within 1 week by PCP or SNF MD ( we routinely change or add medications that can affect your baseline labs and fluid status, therefore we recommend that you get the mentioned basic workup next visit with your PCP, your PCP may decide not to get them or add new tests based on their clinical decision)  On your next visit with your PCP, please Get Medicines reviewed and adjusted.  Please request your PCP  to go over all Hospital Tests and Procedure/Radiological results at the follow up, please get all Hospital records sent to your Prim MD by signing hospital release before you go home.  Activity: As tolerated with Full fall precautions use walker/cane & assistance as needed  For Heart failure patients - Check your Weight same time everyday, if you gain over 2 pounds, or you develop in leg swelling, experience more shortness  of breath or chest pain, call your Primary MD immediately. Follow Cardiac Low Salt Diet and 1.5 lit/day fluid restriction.  If you have smoked or chewed Tobacco in the last 2 yrs please stop smoking, stop any regular Alcohol  and or any Recreational drug use.  If you experience worsening of your admission symptoms, develop shortness of breath, life threatening emergency, suicidal or homicidal thoughts you must seek medical attention immediately by calling 911 or calling your MD immediately  if symptoms less severe.  You Must read complete instructions/literature along with all the possible adverse reactions/side effects for all the Medicines you take and that have been prescribed to you. Take any new Medicines after you have completely understood and accpet all the possible adverse reactions/side effects.   Do not drive, operate heavy machinery, perform activities at heights, swimming or participation in water activities or provide baby sitting services if your were admitted for syncope or siezures until you have seen by Primary MD  or a Neurologist and advised to do so again.  Do not drive when taking Pain medications.  Do not take more than prescribed Pain, Sleep and Anxiety Medications  Wear Seat belts while driving.   Please note You were cared for by a hospitalist during your hospital stay. If you have any questions about your discharge medications or the care you received while you were in the hospital after you are discharged, you can call the unit and asked to speak with the hospitalist on call if the hospitalist that took care of you is not available. Once you are discharged, your primary care physician will handle any further medical issues. Please note that NO REFILLS for any discharge medications will be authorized once you are discharged, as it is imperative that you return to your primary care physician (or establish a relationship with a primary care physician if you do not have one)  for your aftercare needs so that they can reassess your need for medications and monitor your lab values.    Follow ups:   Discharge Instructions     Diet general   Complete by: As directed    Increase activity slowly   Complete by: As directed        Follow-up Information     Michel Bickers, MD Follow up.   Specialty: Infectious Diseases Contact information: [address] [place] [address] [phone]                 Wound care:     Discharge Exam:   Vitals:   03/13/21 2016 03/14/21 0156 03/14/21 0814 03/14/21 0924  BP:  110/63  (!) 120/108  Pulse:  (!) 54  62  Resp:  16  16  Temp:  97.8 F (36.6 C)  98.1 F (36.7 C)  TempSrc:  Oral  Oral  SpO2: 96% 91% 93% 97%  Weight:      Height:        Body mass index is 23.91 kg/m.  General exam: Pleasant, elderly African-American female.  Not in distress Skin: No rashes, lesions or ulcers. HEENT: Atraumatic, normocephalic, no obvious bleeding Lungs: Clear to auscultation bilaterally CVS: Regular rate and rhythm, no murmur GI/Abd soft, nontender, nondistended, bowel sound present CNS: Alert, awake, oriented x3 Psychiatry: Mood appropriate Extremities: No pedal edema, no calf tenderness  Time coordinating discharge: 35 minutes   The results of significant diagnostics from this hospitalization (including imaging, microbiology, ancillary and laboratory) are listed below for reference.    Procedures and Diagnostic Studies:   CT HEAD WO CONTRAST (5MM)  Result Date: 03/10/2021 CLINICAL DATA:  Headache body aches EXAM: CT HEAD WITHOUT CONTRAST TECHNIQUE: Contiguous axial images were obtained from the base of the skull through the vertex without intravenous contrast. COMPARISON:  CT brain 05/04/2017 FINDINGS: Brain: No evidence of acute infarction, hemorrhage, hydrocephalus, extra-axial collection or mass lesion/mass effect. Vascular: No hyperdense vessel or unexpected calcification. Carotid  vascular calcification Skull: Normal. Negative for fracture or focal lesion. Sinuses/Orbits: Patchy mucosal thickening in the sinuses Other: None IMPRESSION: Negative non contrasted CT appearance of the brain Electronically Signed   By: Donavan Foil M.D.   On: 03/10/2021 20:40   DG Chest Portable 1 View  Result Date: 03/10/2021 CLINICAL DATA:  Shortness of breath with cough EXAM: PORTABLE CHEST 1 VIEW COMPARISON:  06/04/2020, chest CT 01/23/2018, chest x-ray 09/09/2018 FINDINGS: Emphysematous disease. Mild diffuse reticular opacity likely due to chronic disease. No acute confluent airspace disease or pleural effusion.  Stable cardiomediastinal silhouette with aortic atherosclerosis. IMPRESSION: No active disease.  Emphysema Electronically Signed   By: Donavan Foil M.D.   On: 03/10/2021 17:46   ECHOCARDIOGRAM COMPLETE  Result Date: 03/11/2021    ECHOCARDIOGRAM REPORT   Patient Name:   TAIJA MATHIAS Date of Exam: 03/11/2021 Medical Rec #:  [id number]       Height:       63.0 in Accession #:    [id number]      Weight:       135.0 lb Date of Birth:  [birth year]       BSA:          1.636 m Patient Age:    24 years        BP:           90/59 mmHg Patient Gender: F               HR:           57 bpm. Exam Location:  Inpatient Procedure: 2D Echo Indications:    SOB  History:        Patient has no prior history of Echocardiogram examinations.  Sonographer:    MH Referring Phys: [id number] ANKIT CHIRAG AMIN IMPRESSIONS  1. Left ventricular ejection fraction, by estimation, is 60 to 65%. The left ventricle has normal function. The left ventricle has no regional wall motion abnormalities. There is mild concentric left ventricular hypertrophy. Left ventricular diastolic parameters were normal.  2. Right ventricular systolic function is normal. The right ventricular size is normal. There is mildly elevated pulmonary artery systolic pressure. The estimated right ventricular systolic pressure is 29.4 mmHg.  3. Left atrial size  was mildly dilated.  4. The mitral valve is normal in structure. No evidence of mitral valve regurgitation.  5. The aortic valve is tricuspid. Aortic valve regurgitation is not visualized. No aortic stenosis is present.  6. The inferior vena cava is dilated in size with >50% respiratory variability, suggesting right atrial pressure of 8 mmHg. Comparison(s): No prior Echocardiogram. FINDINGS  Left Ventricle: Left ventricular ejection fraction, by estimation, is 60 to 65%. The left ventricle has normal function. The left ventricle has no regional wall motion abnormalities. The left ventricular internal cavity size was normal in size. There is  mild concentric left ventricular hypertrophy. Left ventricular diastolic parameters were normal. Right Ventricle: The right ventricular size is normal. No increase in right ventricular wall thickness. Right ventricular systolic function is normal. There is mildly elevated pulmonary artery systolic pressure. The tricuspid regurgitant velocity is 3.04  m/s, and with an assumed right atrial pressure of 8 mmHg, the estimated right ventricular systolic pressure is 76.5 mmHg. Left Atrium: Left atrial size was mildly dilated. Right Atrium: Right atrial size was normal in size. Pericardium: There is no evidence of pericardial effusion. Mitral Valve: The mitral valve is normal in structure. No evidence of mitral valve regurgitation. Tricuspid Valve: The tricuspid valve is normal in structure. Tricuspid valve regurgitation is mild . No evidence of tricuspid stenosis. Aortic Valve: The aortic valve is tricuspid. Aortic valve regurgitation is not visualized. No aortic stenosis is present. Aortic valve mean gradient measures 9.0 mmHg. Aortic valve peak gradient measures 15.5 mmHg. Aortic valve area, by VTI measures 2.27  cm. Pulmonic Valve: The pulmonic valve was normal in structure. Pulmonic valve regurgitation is not visualized. No evidence of pulmonic stenosis. Aorta: The aortic root and  ascending aorta are structurally normal, with no evidence of dilitation. Venous: The  inferior vena cava is dilated in size with greater than 50% respiratory variability, suggesting right atrial pressure of 8 mmHg. IAS/Shunts: No atrial level shunt detected by color flow Doppler.  LEFT VENTRICLE PLAX 2D LVIDd:         4.40 cm     Diastology LVIDs:         2.70 cm     LV e' medial:    11.20 cm/s LV PW:         1.60 cm     LV E/e' medial:  8.0 LV IVS:        1.20 cm     LV e' lateral:   11.60 cm/s LVOT diam:     1.90 cm     LV E/e' lateral: 7.7 LV SV:         122 LV SV Index:   75 LVOT Area:     2.84 cm  LV Volumes (MOD) LV vol d, MOD A4C: 53.1 ml LV vol s, MOD A4C: 15.5 ml LV SV MOD A4C:     53.1 ml RIGHT VENTRICLE             IVC RV S prime:     15.10 cm/s  IVC diam: 3.00 cm TAPSE (M-mode): 3.1 cm LEFT ATRIUM             Index       RIGHT ATRIUM           Index LA diam:        3.40 cm 2.08 cm/m  RA Area:     13.20 cm LA Vol (A2C):   63.5 ml 38.81 ml/m RA Volume:   30.30 ml  18.52 ml/m LA Vol (A4C):   61.1 ml 37.34 ml/m LA Biplane Vol: 64.3 ml 39.30 ml/m  AORTIC VALVE                    PULMONIC VALVE AV Area (Vmax):    2.35 cm     PV Vmax:       1.06 m/s AV Area (Vmean):   2.42 cm     PV Peak grad:  4.5 mmHg AV Area (VTI):     2.27 cm AV Vmax:           197.00 cm/s AV Vmean:          142.000 cm/s AV VTI:            0.539 m AV Peak Grad:      15.5 mmHg AV Mean Grad:      9.0 mmHg LVOT Vmax:         163.00 cm/s LVOT Vmean:        121.000 cm/s LVOT VTI:          0.431 m LVOT/AV VTI ratio: 0.80  AORTA Ao Root diam: 2.80 cm Ao Asc diam:  3.50 cm MITRAL VALVE               TRICUSPID VALVE MV Area (PHT): 3.24 cm    TR Peak grad:   37.0 mmHg MV E velocity: 89.50 cm/s  TR Vmax:        304.00 cm/s MV A velocity: 59.10 cm/s MV E/A ratio:  1.51        SHUNTS                            Systemic VTI:  0.43 m  Systemic Diam: 1.90 cm Rudean Haskell MD Electronically signed by Rudean Haskell MD Signature Date/Time: 03/11/2021/9:38:54 PM    Final    CT Angio Chest/Abd/Pel for Dissection W and/or W/WO  Result Date: 03/10/2021 CLINICAL DATA:  Body aches, cough, nausea for 5 days, fever, history of thoracic aortic aneurysm, abdominal and back pain EXAM: CT ANGIOGRAPHY CHEST, ABDOMEN AND PELVIS TECHNIQUE: Non-contrast CT of the chest was initially obtained. Multidetector CT imaging through the chest, abdomen and pelvis was performed using the standard protocol during bolus administration of intravenous contrast. Multiplanar reconstructed images and MIPs were obtained and reviewed to evaluate the vascular anatomy. CONTRAST:  126mL OMNIPAQUE IOHEXOL 350 MG/ML SOLN COMPARISON:  01/23/2018, 03/10/2021 FINDINGS: CTA CHEST FINDINGS Cardiovascular: No evidence of thoracic aortic aneurysm or dissection. Minimal atherosclerosis of the aortic arch. Heart is unremarkable without pericardial effusion. Mediastinum/Nodes: Subcentimeter lymph nodes are seen within the subcarinal region and right hilum, likely reactive. Thyroid, trachea, and esophagus are unremarkable. Lungs/Pleura: There is patchy airspace disease within the right upper and right lower lobes consistent with bronchopneumonia. No effusion or pneumothorax. Chronic left upper lobe scarring and background emphysema. Central airways are patent. Musculoskeletal: No acute or destructive bony lesions. Reconstructed images demonstrate no additional findings. Review of the MIP images confirms the above findings. CTA ABDOMEN AND PELVIS FINDINGS VASCULAR Aorta: Normal caliber aorta without aneurysm, dissection, vasculitis or significant stenosis. Mild diffuse atherosclerosis. Celiac: Patent without evidence of aneurysm, dissection, vasculitis or significant stenosis. Mild atherosclerosis at the origin without significant stenosis. SMA: Patent without evidence of aneurysm, dissection, vasculitis or significant stenosis. Renals: Both renal arteries are  patent without evidence of aneurysm, dissection, vasculitis, fibromuscular dysplasia or significant stenosis. There is an accessory right renal artery supplying the lower pole which is widely patent. IMA: Patent without evidence of aneurysm, dissection, vasculitis or significant stenosis. Inflow: Patent without evidence of aneurysm, dissection, vasculitis or significant stenosis. Veins: No obvious venous abnormality within the limitations of this arterial phase study. Review of the MIP images confirms the above findings. NON-VASCULAR Hepatobiliary: Diffuse nodularity of the liver capsule compatible with cirrhosis. No focal parenchymal abnormality. The gallbladder is unremarkable. Pancreas: Unremarkable. No pancreatic ductal dilatation or surrounding inflammatory changes. Spleen: The spleen is enlarged measuring 13.5 x 13.8 x 7.2 cm. No focal abnormalities. Adrenals/Urinary Tract: Kidneys enhance normally and symmetrically. No urinary tract calculi or obstructive uropathy. The adrenals and bladder are unremarkable. Stomach/Bowel: No bowel obstruction or ileus. Postsurgical changes from partial right hemicolectomy and reanastomosis. No bowel wall thickening or inflammatory change. Lymphatic: No pathologic adenopathy. Reproductive: Uterus and bilateral adnexa are unremarkable. Other: No free fluid or free gas. Small fat containing supraumbilical ventral hernia. No bowel herniation. Musculoskeletal: No acute or destructive bony lesions. Reconstructed images demonstrate no additional findings. Review of the MIP images confirms the above findings. IMPRESSION: 1. No evidence of thoracoabdominal aortic aneurysm or dissection. 2. Multifocal right-sided bronchopneumonia. 3. 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 4. Small fat containing supraumbilical ventral hernia. 5. Aortic Atherosclerosis (ICD10-I70.0) and Emphysema (ICD10-J43.9). Electronically Signed   By: Randa Ngo M.D.   On:  03/10/2021 20:47     Labs:   Basic Metabolic Panel: Recent Labs  Lab 03/10/21 1826 03/11/21 0007 03/11/21 0554 03/12/21 0411 03/13/21 0414 03/14/21 0352  NA 140  --  136 136 141 139  K 5.3*  --  4.1 4.3 4.9 4.1  CL 105  --  102 102 105 102  CO2 28  --  29 28 29  29  GLUCOSE 110*  --  144* 166* 114* 94  BUN 11  --  10 13 12 15   CREATININE 0.83  --  0.68 0.71 0.55 0.90  CALCIUM 9.2  --  8.2* 9.0 9.4 9.6  MG  --  1.7 1.7 2.1 2.0 1.9  PHOS  --  4.1 2.7  --   --   --    GFR Estimated Creatinine Clearance: 49.5 mL/min (by C-G formula based on SCr of 0.9 mg/dL). Liver Function Tests: Recent Labs  Lab 03/10/21 1826 03/11/21 0554  AST 51* 25  ALT 18 15  ALKPHOS 71 64  BILITOT 2.2* 1.2  PROT 8.4* 7.2  ALBUMIN 3.5 3.0*   Recent Labs  Lab 03/10/21 1826  LIPASE 30   No results for input(s): AMMONIA in the last 168 hours. Coagulation profile No results for input(s): INR, PROTIME in the last 168 hours.  CBC: Recent Labs  Lab 03/10/21 1826 03/11/21 0554 03/12/21 0411 03/13/21 0414 03/14/21 0352  WBC 8.2 7.0 5.7 5.6 5.3  NEUTROABS 6.3 5.9  --   --   --   HGB 13.0 11.4* 12.2 12.9 12.9  HCT 40.5 35.3* 37.6 39.1 39.8  MCV 100.7* 102.9* 100.8* 101.0* 101.5*  PLT 87* 74* 81* 86* 110*   Cardiac Enzymes: Recent Labs  Lab 03/11/21 0007  CKTOTAL 50   BNP: Invalid input(s): POCBNP CBG: No results for input(s): GLUCAP in the last 168 hours. D-Dimer No results for input(s): DDIMER in the last 72 hours. Hgb A1c No results for input(s): HGBA1C in the last 72 hours. Lipid Profile No results for input(s): CHOL, HDL, LDLCALC, TRIG, CHOLHDL, LDLDIRECT in the last 72 hours. Thyroid function studies No results for input(s): TSH, T4TOTAL, T3FREE, THYROIDAB in the last 72 hours.  Invalid input(s): FREET3 Anemia work up No results for input(s): VITAMINB12, FOLATE, FERRITIN, TIBC, IRON, RETICCTPCT in the last 72 hours. Microbiology Recent Results (from the past 240  hour(s))  Resp Panel by RT-PCR (Flu A&B, Covid) Nasopharyngeal Swab     Status: None   Collection Time: 03/10/21  5:48 PM   Specimen: Nasopharyngeal Swab; Nasopharyngeal(NP) swabs in vial transport medium  Result Value Ref Range Status   SARS Coronavirus 2 by RT PCR NEGATIVE NEGATIVE Final    Comment: (NOTE) SARS-CoV-2 target nucleic acids are NOT DETECTED.  The SARS-CoV-2 RNA is generally detectable in upper respiratory specimens during the acute phase of infection. The lowest concentration of SARS-CoV-2 viral copies this assay can detect is 138 copies/mL. A negative result does not preclude SARS-Cov-2 infection and should not be used as the sole basis for treatment or other patient management decisions. A negative result may occur with  improper specimen collection/handling, submission of specimen other than nasopharyngeal swab, presence of viral mutation(s) within the areas targeted by this assay, and inadequate number of viral copies(<138 copies/mL). A negative result must be combined with clinical observations, patient history, and epidemiological information. The expected result is Negative.  Fact Sheet for Patients:  EntrepreneurPulse.com.au  Fact Sheet for Healthcare Providers:  IncredibleEmployment.be  This test is no t yet approved or cleared by the [place] FDA and  has been authorized for detection and/or diagnosis of SARS-CoV-2 by FDA under an Emergency Use Authorization (EUA). This EUA will remain  in effect (meaning this test can be used) for the duration of the COVID-19 declaration under Section 564(b)(1) of the Act, 21 U.S.C.section 360bbb-3(b)(1), unless the authorization is terminated  or revoked sooner.  Influenza A by PCR NEGATIVE NEGATIVE Final   Influenza B by PCR NEGATIVE NEGATIVE Final    Comment: (NOTE) The Xpert Xpress SARS-CoV-2/FLU/RSV plus assay is intended as an aid in the diagnosis of influenza from  Nasopharyngeal swab specimens and should not be used as a sole basis for treatment. Nasal washings and aspirates are unacceptable for Xpert Xpress SARS-CoV-2/FLU/RSV testing.  Fact Sheet for Patients: EntrepreneurPulse.com.au  Fact Sheet for Healthcare Providers: IncredibleEmployment.be  This test is not yet approved or cleared by the [place] FDA and has been authorized for detection and/or diagnosis of SARS-CoV-2 by FDA under an Emergency Use Authorization (EUA). This EUA will remain in effect (meaning this test can be used) for the duration of the COVID-19 declaration under Section 564(b)(1) of the Act, 21 U.S.C. section 360bbb-3(b)(1), unless the authorization is terminated or revoked.  Performed at [facility], [place] [address], [place], [place] [address]   Blood culture (routine x 2)     Status: None (Preliminary result)   Collection Time: 03/10/21  6:28 PM   Specimen: BLOOD  Result Value Ref Range Status   Specimen Description   Final    BLOOD RIGHT ANTECUBITAL Performed at [place] [address], [place], [place] [address]    Special Requests   Final    BOTTLES DRAWN AEROBIC AND ANAEROBIC Blood Culture adequate volume Performed at [facility] [address], [place], [place] [address]    Culture   Final    NO GROWTH 4 DAYS Performed at [facility], [place] [address], [place], [place] [address]    Report Status PENDING  Incomplete  Expectorated Sputum Assessment w Gram Stain, Rflx to Resp Cult     Status: None   Collection Time: 03/11/21  2:46 AM   Specimen: Expectorated Sputum  Result Value Ref Range Status   Specimen Description EXPECTORATED SPUTUM  Final   Special Requests Immunocompromised  Final   Sputum evaluation   Final    THIS SPECIMEN IS ACCEPTABLE FOR SPUTUM CULTURE Performed at [facility], [place] [address] [place],  [place] [address]    Report Status 03/11/2021 FINAL  Final  Culture, Respiratory w Gram Stain     Status: None   Collection Time: 03/11/21  2:46 AM  Result Value Ref Range Status   Specimen Description   Final    EXPECTORATED SPUTUM Performed at [facility], [place] [address], [place], [place] [address]    Special Requests   Final    Immunocompromised Reflexed from [id number] Performed at [facility], [place] [address] [place], [place] [address]    Gram Stain   Final    RARE WBC PRESENT,BOTH PMN AND MONONUCLEAR RARE GRAM POSITIVE COCCI IN PAIRS FEW GRAM VARIABLE ROD Performed at [facility], [place] [address], [place],  [address]    Culture MODERATE STREPTOCOCCUS PNEUMONIAE  Final   Report Status 03/14/2021 FINAL  Final   Organism ID, Bacteria STREPTOCOCCUS PNEUMONIAE  Final      Susceptibility   Streptococcus pneumoniae - MIC*    ERYTHROMYCIN 2 RESISTANT Resistant     LEVOFLOXACIN 0.5 SENSITIVE Sensitive     VANCOMYCIN 0.5 SENSITIVE Sensitive     PENICILLIN (meningitis) <=0.06 SENSITIVE Sensitive     PENO - penicillin <=0.06      PENICILLIN (non-meningitis) <=0.06 SENSITIVE Sensitive     PENICILLIN (oral) <=0.06 SENSITIVE Sensitive     CEFTRIAXONE (non-meningitis) <=0.12  SENSITIVE Sensitive     CEFTRIAXONE (meningitis) <=0.12 SENSITIVE Sensitive     * MODERATE STREPTOCOCCUS PNEUMONIAE     Signed: Keyante Durio  [facility] 03/14/2021, 10:51 AM

## 2021-03-15 DIAGNOSIS — J189 Pneumonia, unspecified organism: Secondary | ICD-10-CM

## 2021-03-15 HISTORY — DX: Pneumonia, unspecified organism: J18.9

## 2021-03-15 LAB — CULTURE, BLOOD (ROUTINE X 2)
Culture: NO GROWTH
Special Requests: ADEQUATE

## 2021-03-15 LAB — PNEUMOCYSTIS JIROVECI SMEAR BY DFA: Pneumocystis jiroveci Ag: NEGATIVE

## 2021-03-16 ENCOUNTER — Ambulatory Visit: Payer: Medicare HMO | Admitting: Podiatry

## 2021-03-18 ENCOUNTER — Encounter: Payer: Self-pay | Admitting: Podiatry

## 2021-03-18 ENCOUNTER — Ambulatory Visit (INDEPENDENT_AMBULATORY_CARE_PROVIDER_SITE_OTHER): Payer: Medicare HMO | Admitting: Podiatry

## 2021-03-18 ENCOUNTER — Other Ambulatory Visit: Payer: Self-pay

## 2021-03-18 DIAGNOSIS — M79676 Pain in unspecified toe(s): Secondary | ICD-10-CM

## 2021-03-18 DIAGNOSIS — G629 Polyneuropathy, unspecified: Secondary | ICD-10-CM | POA: Diagnosis not present

## 2021-03-18 DIAGNOSIS — B351 Tinea unguium: Secondary | ICD-10-CM | POA: Diagnosis not present

## 2021-03-18 DIAGNOSIS — M79672 Pain in left foot: Secondary | ICD-10-CM

## 2021-03-18 DIAGNOSIS — M79671 Pain in right foot: Secondary | ICD-10-CM

## 2021-03-18 NOTE — Progress Notes (Signed)
This patient returns to my office for at risk foot care.  This patient requires this care by a professional since this patient will be at risk due to having thrombocytopenia, cirrhosis, neuropathy and AIDS.This patient is unable to cut nails herself since the patient cannot reach her nails.These nails are painful walking and wearing shoes. She is concerned about her thick nails and requests treatment of nails.   This patient presents for at risk foot care today.  General Appearance  Alert, conversant and in no acute stress.  Vascular  Dorsalis pedis and posterior tibial  pulses are palpable  bilaterally.  Capillary return is within normal limits  bilaterally. Temperature is within normal limits  bilaterally.  Neurologic  Senn-Weinstein monofilament wire test within normal limits  bilaterally. Muscle power within normal limits bilaterally.  Nails Thick disfigured discolored nails with subungual debris  from hallux to fifth toes bilaterally. No evidence of bacterial infection or drainage bilaterally.  Orthopedic  No limitations of motion  feet .  No crepitus or effusions noted.  No bony pathology or digital deformities noted.  Skin  normotropic skin with no porokeratosis noted bilaterally.  No signs of infections or ulcers noted.     Onychomycosis  Pain in right toes  Pain in left toes  Consent was obtained for treatment procedures.   Mechanical debridement of nails 1-5  bilaterally performed with a nail nipper.  Filed with dremel without incident. Nail sample sent to [facility].  She will check with her doctor concerning liver function.   Return office visit   3 months                   Told patient to return for periodic foot care and evaluation due to potential at risk complications.   Gardiner Barefoot DPM

## 2021-03-18 NOTE — Addendum Note (Signed)
Addended by: Gardiner Barefoot on: 03/18/2021 12:48 PM   Modules accepted: Orders

## 2021-03-18 NOTE — Addendum Note (Signed)
Addended byDeidre Ala, Kaia Depaolis L on: 03/18/2021 11:58 AM   Modules accepted: Orders

## 2021-03-18 NOTE — Addendum Note (Signed)
Addended byDeidre Ala, Dayan Desa L on: 03/18/2021 03:44 PM   Modules accepted: Orders

## 2021-03-19 ENCOUNTER — Ambulatory Visit (INDEPENDENT_AMBULATORY_CARE_PROVIDER_SITE_OTHER): Payer: Medicare HMO

## 2021-03-19 DIAGNOSIS — Z0000 Encounter for general adult medical examination without abnormal findings: Secondary | ICD-10-CM

## 2021-03-19 DIAGNOSIS — Z5329 Procedure and treatment not carried out because of patient's decision for other reasons: Secondary | ICD-10-CM

## 2021-03-21 ENCOUNTER — Telehealth: Payer: Self-pay

## 2021-03-21 NOTE — Telephone Encounter (Signed)
Transition Care Management Unsuccessful Follow-up Telephone Call  Date of discharge and from where:  03/14/21 from [facility]  Attempts:  1st Attempt  Reason for unsuccessful TCM follow-up call:  Just leaving her home and not a good time to talk.    Thea Silversmith, RN, MSN, BSN, [place] Care Management Coordinator [phone]

## 2021-03-22 ENCOUNTER — Telehealth: Payer: Self-pay

## 2021-03-22 NOTE — Telephone Encounter (Signed)
Transition Care Management Follow-up Telephone Call Date of discharge and from where: 03/14/21 from [facility] How have you been since you were released from the hospital? "I'm fine" Any questions or concerns? No  Items Reviewed: Did the pt receive and understand the discharge instructions provided? Yes  Medications obtained and verified? Yes  Other? No  Any new allergies since your discharge? No  Dietary orders reviewed? Yes Do you have support at home? Yes   Home Care and Equipment/Supplies: Were home health services ordered? no If so, what is the name of the agency?   Has the agency set up a time to come to the patient's home? not applicable Were any new equipment or medical supplies ordered?  No What is the name of the medical supply agency? Not applicable Were you able to get the supplies/equipment? not applicable Do you have any questions related to the use of the equipment or supplies? No  Functional Questionnaire: (I = Independent and D = Dependent) ADLs: I  Bathing/Dressing- I  Meal Prep- I  Eating- I  Maintaining continence- I  Transferring/Ambulation- I  Managing Meds- I  Follow up appointments reviewed:  PCP Hospital f/u appt confirmed?  States she does not want to go anywhere but office of Dr. Lucianne Lei [facility] f/u appt confirmed? Yes  Scheduled to see Dr. Tommy Medal on 03/24/21 @ 2:00 pm. Are transportation arrangements needed? No  If their condition worsens, is the pt aware to call PCP or go to the Emergency Dept.? Yes Was the patient provided with contact information for the PCP's office or ED? Yes Was to pt encouraged to call back with questions or concerns? Yes

## 2021-03-24 ENCOUNTER — Ambulatory Visit: Payer: Medicare HMO | Admitting: Infectious Disease

## 2021-03-24 ENCOUNTER — Other Ambulatory Visit: Payer: Self-pay

## 2021-03-24 ENCOUNTER — Encounter: Payer: Self-pay | Admitting: Infectious Diseases

## 2021-03-24 ENCOUNTER — Ambulatory Visit (INDEPENDENT_AMBULATORY_CARE_PROVIDER_SITE_OTHER): Payer: Medicare HMO | Admitting: Infectious Diseases

## 2021-03-24 VITALS — BP 116/73 | HR 63 | Temp 98.4°F | Wt 191.0 lb

## 2021-03-24 DIAGNOSIS — Z113 Encounter for screening for infections with a predominantly sexual mode of transmission: Secondary | ICD-10-CM | POA: Diagnosis not present

## 2021-03-24 DIAGNOSIS — R42 Dizziness and giddiness: Secondary | ICD-10-CM | POA: Insufficient documentation

## 2021-03-24 DIAGNOSIS — B20 Human immunodeficiency virus [HIV] disease: Secondary | ICD-10-CM

## 2021-03-24 DIAGNOSIS — Z23 Encounter for immunization: Secondary | ICD-10-CM | POA: Diagnosis not present

## 2021-03-24 DIAGNOSIS — Z79899 Other long term (current) drug therapy: Secondary | ICD-10-CM | POA: Diagnosis not present

## 2021-03-24 NOTE — Assessment & Plan Note (Addendum)
Upon review she relates this to taking lasix Will see if she can wean off this.  Will get her in with IMTS O/w will have her seen by ENT.

## 2021-03-24 NOTE — Progress Notes (Signed)
Subjective:    Patient ID: Ruth Stephenson, female  DOB: [birth year], 70 y.o.        MRN: [id number]   HPI 70 yo F with HIV+ since 1989, on genvoya --> Symtuza. She also has a hx of Hep C treated ([facility]) 2015. 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  In 08-2012 she had surgery for colon cancer (II).  She had CT abd 04-2017: Cirrhotic morphology of the liver. Hyperdense focus within the anterior left hepatic lobe ; when clinically feasible, evaluation with liver MRI is suggested, preferably as an outpatient when the patient is able to breath hold.   She was in hospital 03-14-20 to 03-18-20 for COVID. She received steroids, remdesivir. She got COVID vax [facility] November 2021. She was in hospital 02-2021 with Pneumococcal pna and non-adherence to meds.   No problems with symtuza since d/c.  Had pill fill fatigue prior. Was taking irregularly.    She was sent for MRI of liver and repeat colon at her f/u 03-2020- these are pending.  She has been seen by podiatry recently as well.   Has been episodes of "equilibrium being off". Can happen while standing, not necessarily when rising.    HIV 1 RNA Quant (copies/mL)  Date Value  03/11/2021 <20  12/30/2019 <20 NOT DETECTED  07/10/2019 281 (H)   CD4 T Cell Abs (/uL)  Date Value  03/10/2021 116 (L)  12/30/2019 221 (L)  01/02/2018 240 (L)     Health Maintenance  Topic Date Due  . COVID-19 Vaccine (1) Never done  . Zoster Vaccines- Shingrix (1 of 2) Never done  . MAMMOGRAM  Never done  . INFLUENZA VACCINE  01/24/2021  . DEXA SCAN  06/12/2021 (Originally 04/30/2017)  . TETANUS/TDAP  06/12/2021 (Originally [birth year]01[birth year]1971)  . COLONOSCOPY (Pts 45-42yrs Insurance coverage will need to be confirmed)  09/21/2022  . Hepatitis C Screening  Completed  . HPV VACCINES  Aged Out      Review of Systems  Constitutional:  Negative for chills, fever and weight loss.  Respiratory:  Negative for cough and shortness of breath.    Gastrointestinal:  Negative for constipation and diarrhea.  Genitourinary:  Negative for dysuria.  Neurological:  Positive for dizziness (my equilibrium is off).   Please see HPI. All other systems reviewed and negative.     Objective:  Physical Exam Constitutional:      Appearance: Normal appearance.  HENT:     Right Ear: There is impacted cerumen.     Left Ear: There is impacted cerumen.     Mouth/Throat:     Mouth: Mucous membranes are moist.     Pharynx: No oropharyngeal exudate.  Eyes:     Extraocular Movements: Extraocular movements intact.     Pupils: Pupils are equal, round, and reactive to light.  Cardiovascular:     Rate and Rhythm: Normal rate and regular rhythm.  Pulmonary:     Effort: Pulmonary effort is normal.     Breath sounds: Normal breath sounds.  Abdominal:     General: Bowel sounds are normal. There is no distension.     Palpations: Abdomen is soft.     Tenderness: There is no abdominal tenderness.  Musculoskeletal:        General: Normal range of motion.     Cervical back: Normal range of motion and neck supple.     Right lower leg: No edema.     Left lower leg: No  edema.  Neurological:     General: No focal deficit present.     Mental Status: She is alert and oriented to person, place, and time.          Assessment & Plan:

## 2021-03-24 NOTE — Addendum Note (Signed)
Addended by: Carlean Purl on: 03/24/2021 04:21 PM   Modules accepted: Orders

## 2021-03-24 NOTE — Assessment & Plan Note (Signed)
She is doing well Will continue symtuza Offered flu- does not want Will give PCV23 Offer COVID vax.  rtc in 3-4 months.  Will refer for colon and mammo.

## 2021-03-28 ENCOUNTER — Encounter: Payer: Self-pay | Admitting: Podiatry

## 2021-03-29 ENCOUNTER — Telehealth: Payer: Self-pay | Admitting: *Deleted

## 2021-03-29 NOTE — Telephone Encounter (Signed)
Called patient and gave Nail (Bako) results, explained that he will not treat  w/ Lamicil because of other problems/conditions that she has,suggested that she contact her PCP for further instructions if interested in the pill.  She verbalized understanding and said that she will discuss further at next appointment.

## 2021-03-30 ENCOUNTER — Encounter: Payer: Medicare HMO | Admitting: Internal Medicine

## 2021-04-04 ENCOUNTER — Other Ambulatory Visit (HOSPITAL_COMMUNITY): Payer: Self-pay | Admitting: Internal Medicine

## 2021-04-04 ENCOUNTER — Other Ambulatory Visit (HOSPITAL_COMMUNITY): Payer: Self-pay

## 2021-04-04 DIAGNOSIS — B20 Human immunodeficiency virus [HIV] disease: Secondary | ICD-10-CM

## 2021-04-05 ENCOUNTER — Other Ambulatory Visit (HOSPITAL_COMMUNITY): Payer: Self-pay

## 2021-04-11 ENCOUNTER — Other Ambulatory Visit (HOSPITAL_COMMUNITY): Payer: Self-pay

## 2021-04-11 ENCOUNTER — Other Ambulatory Visit: Payer: Self-pay | Admitting: Infectious Diseases

## 2021-04-11 DIAGNOSIS — Z113 Encounter for screening for infections with a predominantly sexual mode of transmission: Secondary | ICD-10-CM

## 2021-04-11 DIAGNOSIS — Z79899 Other long term (current) drug therapy: Secondary | ICD-10-CM

## 2021-04-11 DIAGNOSIS — B20 Human immunodeficiency virus [HIV] disease: Secondary | ICD-10-CM

## 2021-05-12 ENCOUNTER — Ambulatory Visit: Payer: Medicare HMO

## 2021-05-17 ENCOUNTER — Other Ambulatory Visit (HOSPITAL_COMMUNITY): Payer: Self-pay

## 2021-05-29 ENCOUNTER — Other Ambulatory Visit (HOSPITAL_COMMUNITY): Payer: Self-pay

## 2021-05-30 ENCOUNTER — Other Ambulatory Visit (HOSPITAL_COMMUNITY): Payer: Self-pay

## 2021-06-01 ENCOUNTER — Other Ambulatory Visit (HOSPITAL_COMMUNITY): Payer: Self-pay

## 2021-06-17 ENCOUNTER — Ambulatory Visit: Payer: Medicare HMO | Admitting: Podiatry

## 2021-06-23 ENCOUNTER — Other Ambulatory Visit (HOSPITAL_COMMUNITY): Payer: Self-pay

## 2021-06-30 ENCOUNTER — Ambulatory Visit: Payer: Medicare HMO | Admitting: Infectious Diseases

## 2021-06-30 ENCOUNTER — Other Ambulatory Visit (HOSPITAL_COMMUNITY): Payer: Self-pay

## 2021-07-11 ENCOUNTER — Ambulatory Visit (HOSPITAL_COMMUNITY): Payer: Self-pay

## 2021-07-19 ENCOUNTER — Ambulatory Visit (INDEPENDENT_AMBULATORY_CARE_PROVIDER_SITE_OTHER): Payer: Medicaid Other

## 2021-07-19 ENCOUNTER — Encounter (HOSPITAL_COMMUNITY): Payer: Self-pay

## 2021-07-19 ENCOUNTER — Ambulatory Visit (HOSPITAL_COMMUNITY)
Admission: RE | Admit: 2021-07-19 | Discharge: 2021-07-19 | Disposition: A | Discharge status: Home / Self Care | Payer: Medicaid Other | Source: Ambulatory Visit | Attending: Emergency Medicine | Admitting: Emergency Medicine

## 2021-07-19 ENCOUNTER — Other Ambulatory Visit: Payer: Self-pay

## 2021-07-19 VITALS — BP 116/71 | HR 60 | Temp 98.2°F | Resp 18

## 2021-07-19 DIAGNOSIS — R0789 Other chest pain: Secondary | ICD-10-CM | POA: Diagnosis not present

## 2021-07-19 DIAGNOSIS — S92525A Nondisplaced fracture of medial phalanx of left lesser toe(s), initial encounter for closed fracture: Secondary | ICD-10-CM | POA: Diagnosis not present

## 2021-07-19 DIAGNOSIS — S92535A Nondisplaced fracture of distal phalanx of left lesser toe(s), initial encounter for closed fracture: Secondary | ICD-10-CM | POA: Diagnosis not present

## 2021-07-19 DIAGNOSIS — S92515A Nondisplaced fracture of proximal phalanx of left lesser toe(s), initial encounter for closed fracture: Secondary | ICD-10-CM

## 2021-07-19 DIAGNOSIS — R079 Chest pain, unspecified: Secondary | ICD-10-CM | POA: Diagnosis not present

## 2021-07-19 LAB — CBG MONITORING, ED: Glucose-Capillary: 105 mg/dL — ABNORMAL HIGH (ref 70–99)

## 2021-07-19 MED ORDER — FAMOTIDINE 20 MG PO TABS: 20.0000 mg | ORAL_TABLET | Freq: Two times a day (BID) | ORAL | 0 refills | Status: DC

## 2021-07-19 NOTE — ED Provider Notes (Addendum)
HPI  SUBJECTIVE:  Ruth Stephenson is a 71 y.o. female who presents with 2 issues:  First, 3 to 4 weeks of intermittent diffuse chest burning, feeling "shaky", with difficulty thinking, accompanied with palpitations, lightheadedness/dizziness.  It can last up to 6 minutes, but always resolves shortly after eating something.  She states that she "does not eat well", going days without eating.  She denies chest pressure, shortness of breath, heaviness or radiation of this pain up her neck, down her arm, through to her back, tearing chest pain, diaphoresis, nausea, belching, waterbrash, abdominal pain, syncope.  She has tried eating with improvement in her symptoms, symptoms seem to come while she is at the grocery store walking, but not consistently so.  It is happening about once a day.  She has never had symptoms like this before.  She states that she has not taken her HIV medication regularly for the past 2 years.  Second, she reports second and third left toe pain, swelling, bruising after dropping a dresser drawer on it 4 days ago.  She has been ambulatory on it since.  She has a past medical history of HIV, hepatitis C, treated, she quit smoking 2 months ago, she has a history of colon cancer, GERD, polysubstance abuse of heroin and cocaine, has been clean from illicit drugs for 2 years, is currently on methadone, has a history of thoracic aortic aneurysm, "circulation problems".  No history of arrhythmia, atrial fibrillation, SVT, diabetes, hypertension, thyroid disease, MI, coronary disease, CVA/TIA.  She has been seen by cardiology in December 21 for lower extremity edema, but never followed through with the work-up.  Her last TEE was in 2018 with an EF of 65%.  PMD: [place] for infectious disease.   Past Medical History:  Diagnosis Date   Cancer ([place])    Hepatitis C    HIV (human immunodeficiency virus infection) ([place])     Past Surgical History:  Procedure Laterality Date   CARPAL  TUNNEL RELEASE Left 01/19/2014   Procedure: LEFT CARPAL TUNNEL RELEASE;  Surgeon: Schuyler Amor, MD;  Location: [place];  Service: Orthopedics;  Laterality: Left;   COLON RESECTION N/A 09/23/2012   Procedure: LAPAROSCOPIC RIGHT COLON RESECTION   ;  Surgeon: Edward Jolly, MD;  Location: [facility];  Service: General;  Laterality: N/A;  LAPOROSCOPY, RIGHT HEMICOLECTOMY   COLON SURGERY  09/23/2012   lap right colectomy   COLONOSCOPY N/A 09/20/2012   Procedure: COLONOSCOPY;  Surgeon: Beryle Beams, MD;  Location: [facility];  Service: Endoscopy;  Laterality: N/A;   OPEN REDUCTION INTERNAL FIXATION (ORIF) DISTAL RADIAL FRACTURE Left 01/19/2014   Procedure: OPEN REDUCTION INTERNAL FIXATION (ORIF) LEFT  DISTAL RADIAL FRACTURE;  Surgeon: Schuyler Amor, MD;  Location: [facility];  Service: Orthopedics;  Laterality: Left;    Family History  Problem Relation Age of Onset   Cancer Mother     Social History   Tobacco Use   Smoking status: Former    Packs/day: 0.50    Years: 35.00    Pack years: 17.50    Types: Cigarettes   Smokeless tobacco: Never   Tobacco comments:    trying to cut back; 4-5 cigarettes per day   Vaping Use   Vaping Use: Never used  Substance Use Topics   Alcohol use: Not Currently    Comment: Occasionally; none currently as of 01/28/18   Drug use: Not Currently    Types: Cocaine, Marijuana  Comment: Heroin; in outpatient drug proram (reported on 01/28/18 visit     No current facility-administered medications for this encounter.  Current Outpatient Medications:    famotidine (PEPCID) 20 MG tablet, Take 1 tablet (20 mg total) by mouth 2 (two) times daily., Disp: 40 tablet, Rfl: 0   Darunavir-Cobicistat-Emtricitabine-Tenofovir Alafenamide (SYMTUZA) 800-150-200-10 MG TABS, TAKE 1 TABLET BY MOUTH DAILY WITH BREAKFAST., Disp: 30 tablet, Rfl: 11   furosemide (LASIX) 40 MG tablet, Take 40 mg by mouth daily., Disp: , Rfl:    methadone  (DOLOPHINE) 10 MG/5ML solution, Take 28 mg by mouth daily., Disp: , Rfl:    rosuvastatin (CRESTOR) 5 MG tablet, Take 1 tablet (5 mg total) by mouth daily. (Patient not taking: Reported on 03/11/2021), Disp: 90 tablet, Rfl: 3   sulfamethoxazole-trimethoprim (BACTRIM DS) 800-160 MG tablet, Take 1 tablet by mouth daily., Disp: 30 tablet, Rfl: 5  No Known Allergies   ROS  As noted in HPI.   Physical Exam  BP 116/71 (BP Location: Left Arm)    Pulse 60    Temp 98.2 F (36.8 C) (Oral)    Resp 18    SpO2 96%   Constitutional: Well developed, well nourished, no acute distress Eyes:  EOMI, conjunctiva normal bilaterally HENT: Normocephalic, atraumatic,mucus membranes moist Respiratory: Normal inspiratory effort, lungs clear bilateral Cardiovascular: Normal rate, regular rhythm, no murmurs rubs or gallops GI: nondistended skin: No rash, skin intact Musculoskeletal: Tenderness, bruising left second and third toes, especially over third toe.  Rest of the foot nontender.  RP 2+. Neurologic: Alert & oriented x 3, no focal neuro deficits Psychiatric: Speech and behavior appropriate   ED Course   Medications - No data to display  Orders Placed This Encounter  Procedures   DG Foot Complete Left    Standing Status:   Standing    Number of Occurrences:   1    Order Specific Question:   Reason for Exam (SYMPTOM  OR DIAGNOSIS REQUIRED)    Answer:   r/o 2nd 3rd toe fx   DG Chest 2 View    Standing Status:   Standing    Number of Occurrences:   1    Order Specific Question:   Reason for Exam (SYMPTOM  OR DIAGNOSIS REQUIRED)    Answer:   Intermittent diffuse chest pain, history of thoracic aortic aneurysm, rule out acute changes   Ambulatory referral to Cardiology    Referral Priority:   Routine    Referral Type:   Consultation    Referral Reason:   Specialty Services Required    Requested Specialty:   Cardiology    Number of Visits Requested:   1   Post op shoe    Standing Status:    Standing    Number of Occurrences:   1    Order Specific Question:   Laterality    Answer:   Left   Buddy tape toes    Standing Status:   Standing    Number of Occurrences:   1    Order Specific Question:   Laterality    Answer:   Left   POC CBG monitoring    Standing Status:   Standing    Number of Occurrences:   1   ED EKG    Standing Status:   Standing    Number of Occurrences:   1    Order Specific Question:   Reason for Exam    Answer:   Chest Pain    Order  Specific Question:   Release to patient    Answer:   Immediate    Results for orders placed or performed during the hospital encounter of 07/19/21 (from the past 24 hour(s))  POC CBG monitoring     Status: Abnormal   Collection Time: 07/19/21 12:47 PM  Result Value Ref Range   Glucose-Capillary 105 (H) 70 - 99 mg/dL   DG Chest 2 View  Result Date: 07/19/2021 CLINICAL DATA:  Intermittent diffuse chest pain. EXAM: CHEST - 2 VIEW COMPARISON:  03/10/2021 FINDINGS: Mild bilateral interstitial thickening likely chronic. No focal consolidation. No pleural effusion or pneumothorax. Heart and mediastinal contours are unremarkable. No acute osseous abnormality. IMPRESSION: No active cardiopulmonary disease. Electronically Signed   By: Kathreen Devoid M.D.   On: 07/19/2021 13:01   DG Foot Complete Left  Result Date: 07/19/2021 CLINICAL DATA:  Foot injury EXAM: LEFT FOOT - COMPLETE 3+ VIEW COMPARISON:  Left foot x-ray 01/05/2020 FINDINGS: Acute comminuted nondisplaced fractures of the third middle and distal phalanxes. No additional fracture identified. Associated soft tissue swelling of the third toe. IMPRESSION: Acute nondisplaced fractures of the third middle and distal phalanxes. Electronically Signed   By: Ofilia Neas M.D.   On: 07/19/2021 13:02    ED Clinical Impression  1. Burning chest pain   2. Closed nondisplaced fracture of middle phalanx of lesser toe of left foot, initial encounter      ED Assessment/Plan  EKG:  Normal sinus rhythm, rate 61.  Normal axis, normal intervals.  No hypertrophy.  Nonspecific T wave abnormalities.  No ST-T wave elevation.  No change compared to EKG from 02/2021.  Patient asymptomatic while EKG was obtained.  Checking chest x-ray EKG, fingerstick  1.  Chest burning for 3 to 4 weeks: Could be GERD, hypoglycemia, unstable angina.  Patient has multiple cardiac risk factors.   However, it resolves shortly after she eats.  She admits to not eating regularly.  She states that it is occurring approximately once a day.  Will check glucose here.  She is asymptomatic at this time.  Doubt current ischemia.  her EKG is unremarkable.  Doubt dissecting thoracic aortic aneurysm.   Feel that she is stable for outpatient work-up. Will refer to cardiology, and advised her to follow-up with them this time.  Gave strict ER return precautions.  Also advised her to follow-up with [facility].   Glucose 105. Reviewed imaging independently.  Normal chest x-ray.  See radiology report for details.  Suspect intermittent hypoglycemia.  Advised frequent small meals.  Will also try some Pepcid.  2.  Second and third left toe pain: She has bruising, tenderness.  X-ray to rule out fracture.   X-ray independently reviewed.  Acute nondisplaced fractures of the third middle and distal phalanxes.  See radiology report for details.    Patient has third toe fracture.  Postop shoe, buddy tape.  Tylenol as needed.  Follow-up with podiatry or orthopedics as needed.  Discussed labs, imaging, MDM, treatment plan, and plan for follow-up with patient. Discussed sn/sx that should prompt return to the ED. patient agrees with plan.   Meds ordered this encounter  Medications   famotidine (PEPCID) 20 MG tablet    Sig: Take 1 tablet (20 mg total) by mouth 2 (two) times daily.    Dispense:  40 tablet    Refill:  0      *This clinic note was created using [employer]. Therefore, there may be occasional mistakes  despite careful proofreading.  ?  Melynda Ripple, MD 07/19/21 1320    Melynda Ripple, MD 07/19/21 1347

## 2021-07-19 NOTE — Discharge Instructions (Addendum)
Your glucose, chest x-ray and EKG were normal.  I am going to start you on Pepcid in case this is your acid reflux.  I suspect that this is intermittent drops in your blood sugar.  Make sure you eat frequent small meals.  Go immediately to the ED if changes, gets worse, does not resolve with eating, or for any concerns.  This could be a problem with your heart.  I have ordered a referral to cardiology.  Please follow-up with them.  Also follow-up with your primary care provider ASAP.  You also have broken your third toe.  Buddy tape it, wear postop shoe.  May take Tylenol 1000 mg 3 times a day as needed for pain.  Ice, elevate.  Follow-up with [facility] on [address]

## 2021-07-19 NOTE — ED Triage Notes (Signed)
Pt reports her chest gets tight and hot and started having palpitations on and off x 3 weeks. States  when the chest os tight she "can not think properly". Denies chest tightness at this moment.   Pt reports pain in left 3rd and 4th toes  x 3-4 days after the dressed drawer dropped over left foot.

## 2021-07-20 NOTE — Progress Notes (Deleted)
Cardiology Office Note:    Date:  07/20/2021   ID:  Ruth Stephenson, DOB May 24, 1951, MRN [id number]  PCP:  System, Provider Not In  [place] Cardiologist:  None  [facility] Electrophysiologist:  None   CC: Bilateral LE edema  History of Present Illness:    Ruth Stephenson is a 71 y.o. 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 At last eval had normal echo and negative venous duplex; was lost to follow up.  Seen 07/21/21.  Patient notes that she is doing ***.   Since day prior/last visit notes *** . There are no*** interval hospital/ED visit.    No chest pain or pressure ***.  No SOB/DOE*** and no PND/Orthopnea***.  No weight gain or leg swelling***.  No palpitations or syncope ***.  Ambulatory blood pressure ***.   Past Medical History:  Diagnosis Date   Cancer ([place])    Hepatitis C    HIV (human immunodeficiency virus infection) ([place])     Past Surgical History:  Procedure Laterality Date   CARPAL TUNNEL RELEASE Left 01/19/2014   Procedure: LEFT CARPAL TUNNEL RELEASE;  Surgeon: Schuyler Amor, MD;  Location: [place];  Service: Orthopedics;  Laterality: Left;   COLON RESECTION N/A 09/23/2012   Procedure: LAPAROSCOPIC RIGHT COLON RESECTION   ;  Surgeon: Edward Jolly, MD;  Location[facility];  Service: General;  Laterality: N/A;  LAPOROSCOPY, RIGHT HEMICOLECTOMY   COLON SURGERY  09/23/2012   lap right colectomy   COLONOSCOPY N/A 09/20/2012   Procedure: COLONOSCOPY;  Surgeon: Beryle Beams, MD;  [facility];  Service: Endoscopy;  Laterality: N/A;   OPEN REDUCTION INTERNAL FIXATION (ORIF) DISTAL RADIAL FRACTURE Left 01/19/2014   Procedure: OPEN REDUCTION INTERNAL FIXATION (ORIF) LEFT  DISTAL RADIAL FRACTURE;  Surgeon: Schuyler Amor, MD;   Location: [place];  Service: Orthopedics;  Laterality: Left;    Current Medications: No outpatient medications have been marked as taking for the 07/21/21 encounter (Appointment) with Werner Lean, MD.     Allergies:   Patient has no known allergies.   Social History   Socioeconomic History   Marital status: Single    Spouse name: Not on file   Number of children: 5   Years of education: Not on file   Highest education level: Not on file  Occupational History   Not on file  Tobacco Use   Smoking status: Former    Packs/day: 0.50    Years: 35.00    Pack years: 17.50    Types: Cigarettes   Smokeless tobacco: Never   Tobacco comments:    trying to cut back; 4-5 cigarettes per day   Vaping Use   Vaping Use: Never used  Substance and Sexual Activity   Alcohol use: Not Currently    Comment: Occasionally; none currently as of 01/28/18   Drug use: Not Currently    Types: Cocaine, Marijuana    Comment: Heroin; in outpatient drug proram (reported on 01/28/18 visit    Sexual activity: Not on file    Comment: declined condoms  Other Topics Concern   Not on file  Social History Narrative   ** Merged History Encounter **       Social Determinants of Health   Financial Resource Strain: Not on file  Food  Insecurity: Not on file  Transportation Needs: Not on file  Physical Activity: Not on file  Stress: Not on file  Social Connections: Not on file     Family History: [name] family history includes Cancer in her mother. History of coronary artery disease notable for no members. History of heart failure notable for no members. No history of cardiomyopathies including hypertrophic cardiomyopathy, left ventricular non-compaction, or arrhythmogenic right ventricular cardiomyopathy. History of arrhythmia notable for no members. Denies family history of sudden cardiac death including drowning, car accidents, or unexplained deaths in the family. No  history of bicuspid aortic valve or aortic aneurysm or dissection.  ROS:   Please see the history of present illness.    All other systems reviewed and are negative.  EKGs/Labs/Other Studies Reviewed:    The following studies were reviewed today:   EKG:   06/22/20 Sinus Rhythm 68 QTc 340  Transthoracic Echocardiogram: Date:03/11/21 Results:  Study Conclusions   1. Left ventricular ejection fraction, by estimation, is 60 to 65%. The  left ventricle has normal function. The left ventricle has no regional  wall motion abnormalities. There is mild concentric left ventricular  hypertrophy. Left ventricular diastolic  parameters were normal.   2. Right ventricular systolic function is normal. The right ventricular  size is normal. There is mildly elevated pulmonary artery systolic  pressure. The estimated right ventricular systolic pressure is 82.5 mmHg.   3. Left atrial size was mildly dilated.   4. The mitral valve is normal in structure. No evidence of mitral valve  regurgitation.   5. The aortic valve is tricuspid. Aortic valve regurgitation is not  visualized. No aortic stenosis is present.   6. The inferior vena cava is dilated in size with >50% respiratory  variability, suggesting right atrial pressure of 8 mmHg.   NonCardiac CT: Date:01/05/2018 Results:  Aortic Arch Atherosclerosis  Cardiac Event Monitoring***: Date: Results:  Transthoracic Echocardiogram***: Date: Results:  Transesophageal Echocardiogram***: Date: Results:  Cardiac/NonCardiac CT ()***: Date: Results:  ECG or NM Stress Testing ***: Date: Results:  Venous Duplex: Date: 07/01/20 Results: RIGHT:  - There is no evidence of deep vein thrombosis in the lower extremity.  However, portions of this examination were limited- proximal calf.  - There is no evidence of superficial venous thrombosis.     - No cystic structure found in the popliteal fossa.     LEFT:  - There is no evidence of deep  vein thrombosis in the lower extremity.  However, portions of this examination were limited- proximal calf.  - There is no evidence of superficial venous thrombosis.    Recent Labs: 03/11/2021: ALT 15; B Natriuretic Peptide 327.2; TSH 1.308 03/14/2021: BUN 15; Creatinine, Ser 0.90; Hemoglobin 12.9; Magnesium 1.9; Platelets 110; Potassium 4.1; Sodium 139  Recent Lipid Panel    Component Value Date/Time   CHOL 163 12/30/2019 1523   TRIG 77 03/14/2020 1849   HDL 59 12/30/2019 1523   CHOLHDL 2.8 12/30/2019 1523   VLDL 14 04/12/2016 1600   LDLCALC 88 12/30/2019 1523     Physical Exam:    VS:  There were no vitals taken for this visit.    Wt Readings from Last 3 Encounters:  03/24/21 86.6 kg  03/11/21 61.2 kg  07/01/20 91 kg     Gen: *** distress, *** obese/well nourished/malnourished   Neck: No JVD, *** carotid bruit Ears: Pilar Plate Sign Cardiac: No Rubs or Gallops, *** Murmur, ***cardia, *** radial pulses Respiratory: Clear to auscultation bilaterally, ***  effort, ***  respiratory rate GI: Soft, nontender, non-distended *** MS: No *** edema; *** moves all extremities Integument: Skin feels *** Neuro:  At time of evaluation, alert and oriented to person/place/time/situation *** Psych: Normal affect, patient feels ***   ASSESSMENT:    No diagnosis found.  PLAN:    LE edema - will start utilization of compression stockings - Starting 40 mg po lasix daily In subsequent visits  Aortic atherosclerosis HIV -LDL goal less than 70 in the setting of the above - Recheck lipid profile LFTs in 3 months - gave education on dietary changes  Long Term Drug Monitoring prior Methadone - QTc not prolonged    .Tobacco Abuse  2-3 follow up unless new symptoms or abnormal test results warranting change in plan  Would be reasonable for  APP Follow up   Medication Adjustments/Labs and Tests Ordered: Current medicines are reviewed at length with the patient today.  Concerns  regarding medicines are outlined above.  No orders of the defined types were placed in this encounter.  No orders of the defined types were placed in this encounter.   There are no Patient Instructions on file for this visit.   Signed, Werner Lean, MD  07/20/2021 12:44 PM    [place]

## 2021-07-21 ENCOUNTER — Ambulatory Visit: Payer: Medicaid Other | Admitting: Internal Medicine

## 2021-07-22 ENCOUNTER — Other Ambulatory Visit (HOSPITAL_COMMUNITY): Payer: Self-pay

## 2021-07-25 ENCOUNTER — Other Ambulatory Visit (HOSPITAL_COMMUNITY): Payer: Self-pay

## 2021-07-26 ENCOUNTER — Ambulatory Visit: Payer: Medicaid Other | Admitting: Internal Medicine

## 2021-07-26 NOTE — Progress Notes (Deleted)
Cardiology Office Note:    Date:  07/26/2021   ID:  Ruth Stephenson, DOB Oct 16, 1950, MRN [id number]  PCP:  System, Provider Not In  [place] Cardiologist:  None  [facility] Electrophysiologist:  None   CC: Bilateral LE edema  History of Present Illness:    Ruth Stephenson is a 71 y.o. 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 At last eval had normal echo and negative venous duplex; was lost to follow up.  Seen 07/21/21.  Patient notes that she is doing ***.   Since day prior/last visit notes *** . There are no*** interval hospital/ED visit.    No chest pain or pressure ***.  No SOB/DOE*** and no PND/Orthopnea***.  No weight gain or leg swelling***.  No palpitations or syncope ***.  Ambulatory blood pressure ***.   Past Medical History:  Diagnosis Date   Cancer ([place])    Hepatitis C    HIV (human immunodeficiency virus infection) ([place])     Past Surgical History:  Procedure Laterality Date   CARPAL TUNNEL RELEASE Left 01/19/2014   Procedure: LEFT CARPAL TUNNEL RELEASE;  Surgeon: Schuyler Amor, MD;  Location: [place];  Service: Orthopedics;  Laterality: Left;   COLON RESECTION N/A 09/23/2012   Procedure: LAPAROSCOPIC RIGHT COLON RESECTION   ;  Surgeon: Edward Jolly, MD;  [facility];  Service: General;  Laterality: N/A;  LAPOROSCOPY, RIGHT HEMICOLECTOMY   COLON SURGERY  09/23/2012   lap right colectomy   COLONOSCOPY N/A 09/20/2012   Procedure: COLONOSCOPY;  Surgeon: Beryle Beams, MD;  [facility];  Service: Endoscopy;  Laterality: N/A;   OPEN REDUCTION INTERNAL FIXATION (ORIF) DISTAL RADIAL FRACTURE Left 01/19/2014   Procedure: OPEN REDUCTION INTERNAL FIXATION (ORIF) LEFT  DISTAL RADIAL FRACTURE;  Surgeon: Schuyler Amor, MD;   Location: [place];  Service: Orthopedics;  Laterality: Left;    Current Medications: No outpatient medications have been marked as taking for the 07/26/21 encounter (Appointment) with Werner Lean, MD.     Allergies:   Patient has no known allergies.   Social History   Socioeconomic History   Marital status: Single    Spouse name: Not on file   Number of children: 5   Years of education: Not on file   Highest education level: Not on file  Occupational History   Not on file  Tobacco Use   Smoking status: Former    Packs/day: 0.50    Years: 35.00    Pack years: 17.50    Types: Cigarettes   Smokeless tobacco: Never   Tobacco comments:    trying to cut back; 4-5 cigarettes per day   Vaping Use   Vaping Use: Never used  Substance and Sexual Activity   Alcohol use: Not Currently    Comment: Occasionally; none currently as of 01/28/18   Drug use: Not Currently    Types: Cocaine, Marijuana    Comment: Heroin; in outpatient drug proram (reported on 01/28/18 visit    Sexual activity: Not on file    Comment: declined condoms  Other Topics Concern   Not on file  Social History Narrative   ** Merged History Encounter **       Social Determinants of Health   Financial Resource Strain: Not on file  Food  Insecurity: Not on file  Transportation Needs: Not on file  Physical Activity: Not on file  Stress: Not on file  Social Connections: Not on file     Family History: [name] family history includes Cancer in her mother. History of coronary artery disease notable for no members. History of heart failure notable for no members. No history of cardiomyopathies including hypertrophic cardiomyopathy, left ventricular non-compaction, or arrhythmogenic right ventricular cardiomyopathy. History of arrhythmia notable for no members. Denies family history of sudden cardiac death including drowning, car accidents, or unexplained deaths in the family. No  history of bicuspid aortic valve or aortic aneurysm or dissection.  ROS:   Please see the history of present illness.    All other systems reviewed and are negative.  EKGs/Labs/Other Studies Reviewed:    The following studies were reviewed today:   EKG:   06/22/20 Sinus Rhythm 68 QTc 340  Transthoracic Echocardiogram: Date:03/11/21 Results:  Study Conclusions   1. Left ventricular ejection fraction, by estimation, is 60 to 65%. The  left ventricle has normal function. The left ventricle has no regional  wall motion abnormalities. There is mild concentric left ventricular  hypertrophy. Left ventricular diastolic  parameters were normal.   2. Right ventricular systolic function is normal. The right ventricular  size is normal. There is mildly elevated pulmonary artery systolic  pressure. The estimated right ventricular systolic pressure is 69.6 mmHg.   3. Left atrial size was mildly dilated.   4. The mitral valve is normal in structure. No evidence of mitral valve  regurgitation.   5. The aortic valve is tricuspid. Aortic valve regurgitation is not  visualized. No aortic stenosis is present.   6. The inferior vena cava is dilated in size with >50% respiratory  variability, suggesting right atrial pressure of 8 mmHg.   NonCardiac CT: Date:01/05/2018 Results:  Aortic Arch Atherosclerosis  Cardiac Event Monitoring***: Date: Results:  Transthoracic Echocardiogram***: Date: Results:  Transesophageal Echocardiogram***: Date: Results:  Cardiac/NonCardiac CT ()***: Date: Results:  ECG or NM Stress Testing ***: Date: Results:  Venous Duplex: Date: 07/01/20 Results: RIGHT:  - There is no evidence of deep vein thrombosis in the lower extremity.  However, portions of this examination were limited- proximal calf.  - There is no evidence of superficial venous thrombosis.     - No cystic structure found in the popliteal fossa.     LEFT:  - There is no evidence of deep  vein thrombosis in the lower extremity.  However, portions of this examination were limited- proximal calf.  - There is no evidence of superficial venous thrombosis.    Recent Labs: 03/11/2021: ALT 15; B Natriuretic Peptide 327.2; TSH 1.308 03/14/2021: BUN 15; Creatinine, Ser 0.90; Hemoglobin 12.9; Magnesium 1.9; Platelets 110; Potassium 4.1; Sodium 139  Recent Lipid Panel    Component Value Date/Time   CHOL 163 12/30/2019 1523   TRIG 77 03/14/2020 1849   HDL 59 12/30/2019 1523   CHOLHDL 2.8 12/30/2019 1523   VLDL 14 04/12/2016 1600   LDLCALC 88 12/30/2019 1523     Physical Exam:    VS:  There were no vitals taken for this visit.    Wt Readings from Last 3 Encounters:  03/24/21 86.6 kg  03/11/21 61.2 kg  07/01/20 91 kg     Gen: *** distress, *** obese/well nourished/malnourished   Neck: No JVD, *** carotid bruit Ears: Pilar Plate Sign Cardiac: No Rubs or Gallops, *** Murmur, ***cardia, *** radial pulses Respiratory: Clear to auscultation bilaterally, ***  effort, ***  respiratory rate GI: Soft, nontender, non-distended *** MS: No *** edema; *** moves all extremities Integument: Skin feels *** Neuro:  At time of evaluation, alert and oriented to person/place/time/situation *** Psych: Normal affect, patient feels ***   ASSESSMENT:    No diagnosis found.  PLAN:    LE edema - will start utilization of compression stockings - Starting 40 mg po lasix daily In subsequent visits  Aortic atherosclerosis HIV -LDL goal less than 70 in the setting of the above - Recheck lipid profile LFTs in 3 months - gave education on dietary changes  Long Term Drug Monitoring prior Methadone - QTc not prolonged    .Tobacco Abuse  2-3 follow up unless new symptoms or abnormal test results warranting change in plan  Would be reasonable for  APP Follow up   Medication Adjustments/Labs and Tests Ordered: Current medicines are reviewed at length with the patient today.  Concerns  regarding medicines are outlined above.  No orders of the defined types were placed in this encounter.  No orders of the defined types were placed in this encounter.   There are no Patient Instructions on file for this visit.   Signed, Werner Lean, MD  07/26/2021 8:25 AM    [facility]

## 2021-08-16 ENCOUNTER — Other Ambulatory Visit (HOSPITAL_COMMUNITY): Payer: Self-pay

## 2021-08-18 ENCOUNTER — Encounter: Payer: Self-pay | Admitting: Infectious Diseases

## 2021-08-19 ENCOUNTER — Other Ambulatory Visit (HOSPITAL_COMMUNITY): Payer: Self-pay

## 2021-08-22 ENCOUNTER — Other Ambulatory Visit (HOSPITAL_COMMUNITY): Payer: Self-pay

## 2021-08-26 ENCOUNTER — Other Ambulatory Visit: Payer: Self-pay

## 2021-08-26 ENCOUNTER — Other Ambulatory Visit (HOSPITAL_COMMUNITY): Payer: Self-pay

## 2021-08-26 ENCOUNTER — Encounter: Payer: Self-pay | Admitting: Infectious Diseases

## 2021-08-26 ENCOUNTER — Ambulatory Visit (INDEPENDENT_AMBULATORY_CARE_PROVIDER_SITE_OTHER): Payer: Medicare Other | Admitting: Infectious Diseases

## 2021-08-26 VITALS — BP 112/71 | HR 70 | Temp 98.6°F | Ht 63.0 in | Wt 195.0 lb

## 2021-08-26 DIAGNOSIS — B20 Human immunodeficiency virus [HIV] disease: Secondary | ICD-10-CM | POA: Diagnosis not present

## 2021-08-26 DIAGNOSIS — C182 Malignant neoplasm of ascending colon: Secondary | ICD-10-CM

## 2021-08-26 DIAGNOSIS — F1129 Opioid dependence with unspecified opioid-induced disorder: Secondary | ICD-10-CM

## 2021-08-26 DIAGNOSIS — K7469 Other cirrhosis of liver: Secondary | ICD-10-CM

## 2021-08-26 DIAGNOSIS — Z113 Encounter for screening for infections with a predominantly sexual mode of transmission: Secondary | ICD-10-CM

## 2021-08-26 DIAGNOSIS — R600 Localized edema: Secondary | ICD-10-CM | POA: Diagnosis not present

## 2021-08-26 DIAGNOSIS — K760 Fatty (change of) liver, not elsewhere classified: Secondary | ICD-10-CM

## 2021-08-26 DIAGNOSIS — Z23 Encounter for immunization: Secondary | ICD-10-CM | POA: Diagnosis not present

## 2021-08-26 DIAGNOSIS — Z79899 Other long term (current) drug therapy: Secondary | ICD-10-CM | POA: Diagnosis not present

## 2021-08-26 DIAGNOSIS — Z720 Tobacco use: Secondary | ICD-10-CM

## 2021-08-26 MED ORDER — FUROSEMIDE 20 MG PO TABS: 20.0000 mg | ORAL_TABLET | Freq: Every day | ORAL | 3 refills | Status: DC

## 2021-08-26 NOTE — Assessment & Plan Note (Signed)
She remains on methadone. My appreciation to the methadone clinic for f/u ?

## 2021-08-26 NOTE — Assessment & Plan Note (Addendum)
States she is adherent ?Flu vax today, she defers COVID. ?Ask her to comeback for PCV 20.  ?Offered/refused condoms.  ?Check her labs today.  ?Send for mammo, pap ?Will see her back in 6 months ?

## 2021-08-26 NOTE — Assessment & Plan Note (Signed)
As noted above, she needs no further f/u.  ?

## 2021-08-26 NOTE — Assessment & Plan Note (Signed)
Will re-order her MRI of liver.  ?Check LFTs today.  ?

## 2021-08-26 NOTE — Assessment & Plan Note (Signed)
She quit! I encouraged her! ?

## 2021-08-26 NOTE — Addendum Note (Signed)
Addended by: Lin Landsman E on: 08/26/2021 10:20 AM ? ? Modules accepted: Orders ? ?

## 2021-08-26 NOTE — Progress Notes (Signed)
? ?Subjective:  ? ? Patient ID: Ruth Stephenson, female  DOB: [birth year], 71 y.o.        MRN: [id number] ? ? ?HPI ?71yo F with HIV+ since 1989, on genvoya --> Sym[place]. She also has a hx of Hep C treated ([place]) 2015. 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  ?In 08-2012 she had surgery for colon cancer (II).  ?She had CT abd 04-2017: Cirrhotic morphology of the liver. Hyperdense focus within the ?anterior left hepatic lobe ; when clinically feasible, evaluation ?with liver MRI is suggested, preferably as an outpatient when the patient is able to breath hold. ?She had a MRI ordered- which has not been done.  ?  ?She was in hospital 03-14-20 to 03-18-20 for COVID.  ?She was in hospital 02-2021 with Pneumococcal pna and non-adherence to meds.  ? ?She was seen in UC by Dr Renelda Loma [phone] for CP and L 3rd toe fracture. She was started on pepcid (she did not fill, no further CP). She was referred to CV- has been resched.  ? ?Has been having issues with housing. Her landlord is not helpful with repairs. There is mold on the house. She has been in this house for ~ 20 yrs. She has been getting help from organizations [facility]) to get help with new housing.  ?She has f/u at Methadone clinic. Was told she has a problem with her kidneys (I re-assure her that her Cr was normal at last checks).  ? ?Needs: mammo, pap, MRI liver, (does not want COVID vax), flu, pcv 20, labs ? ?Quit smoking! ? ?HIV 1 RNA Quant (copies/mL)  ?Date Value  ?03/11/2021 <20  ?12/30/2019 <20 NOT DETECTED  ?07/10/2019 281 (H)  ? ?CD4 T Cell Abs (/uL)  ?Date Value  ?03/10/2021 116 (L)  ?12/30/2019 221 (L)  ?01/02/2018 240 (L)  ? ? ? ?Health Maintenance  ?Topic Date Due  ? COVID-19 Vaccine (1) Never done  ? TETANUS/TDAP  Never done  ? Zoster Vaccines- Shingrix (1 of 2) Never done  ? MAMMOGRAM  Never done  ? DEXA SCAN  Never done  ? INFLUENZA VACCINE  01/24/2021  ? COLONOSCOPY (Pts 45-43yrs Insurance coverage will need to be confirmed)   09/21/2022  ? Pneumonia Vaccine 33+ Years old  Completed  ? Hepatitis C Screening  Completed  ? HPV VACCINES  Aged Out  ? ? ? ? ?Review of Systems  ?Constitutional:  Positive for malaise/fatigue. Negative for chills, fever and weight loss.  ?HENT:  Positive for congestion and nosebleeds.   ?Respiratory:  Negative for cough and shortness of breath.   ?Cardiovascular:  Positive for leg swelling.  ?Gastrointestinal:  Negative for constipation and diarrhea.  ?Genitourinary:  Negative for dysuria.  ?Psychiatric/Behavioral:  Negative for depression.   ? ?Please see HPI. All other systems reviewed and negative. ? ?   ?Objective:  ?Physical Exam ?Vitals reviewed.  ?Constitutional:   ?   Appearance: Normal appearance. She is obese.  ?HENT:  ?   Mouth/Throat:  ?   Mouth: Mucous membranes are moist.  ?   Pharynx: No oropharyngeal exudate.  ?Eyes:  ?   Extraocular Movements: Extraocular movements intact.  ?   Pupils: Pupils are equal, round, and reactive to light.  ?Cardiovascular:  ?   Rate and Rhythm: Normal rate and regular rhythm.  ?Pulmonary:  ?   Effort: Pulmonary effort is normal.  ?   Breath sounds: Normal breath sounds.  ?Abdominal:  ?  General: Bowel sounds are normal. There is no distension.  ?   Palpations: Abdomen is soft.  ?   Tenderness: There is no abdominal tenderness.  ?Musculoskeletal:     ?   General: Normal range of motion.  ?   Cervical back: Normal range of motion and neck supple.  ?   Right lower leg: Edema present.  ?   Left lower leg: Edema present.  ?Skin: ?   General: Skin is warm.  ?   Coloration: Skin is not jaundiced.  ?Neurological:  ?   General: No focal deficit present.  ?   Mental Status: She is alert.  ?Psychiatric:     ?   Mood and Affect: Mood normal.  ? ? ? ? ? ? ?   ?Assessment & Plan:  ? ?

## 2021-08-29 LAB — CBC
HCT: 39.3 % (ref 35.0–45.0)
Hemoglobin: 13.2 g/dL (ref 11.7–15.5)
MCH: 32.4 pg (ref 27.0–33.0)
MCHC: 33.6 g/dL (ref 32.0–36.0)
MCV: 96.3 fL (ref 80.0–100.0)
RBC: 4.08 10*6/uL (ref 3.80–5.10)
RDW: 12.1 % (ref 11.0–15.0)
WBC: 2.2 10*3/uL — ABNORMAL LOW (ref 3.8–10.8)

## 2021-08-29 LAB — LIPID PANEL
Cholesterol: 110 mg/dL (ref <200)
HDL: 28 mg/dL — ABNORMAL LOW (ref >=50)
LDL Cholesterol (Calc): 67 mg/dL (calc)
Non-HDL Cholesterol (Calc): 82 mg/dL (calc) (ref <130)
Total CHOL/HDL Ratio: 3.9 (calc) (ref <5.0)
Triglycerides: 66 mg/dL (ref <150)

## 2021-08-29 LAB — COMPREHENSIVE METABOLIC PANEL
AG Ratio: 0.5 (calc) — ABNORMAL LOW (ref 1.0–2.5)
ALT: 13 U/L (ref 6–29)
AST: 34 U/L (ref 10–35)
Albumin: 3.2 g/dL — ABNORMAL LOW (ref 3.6–5.1)
Alkaline phosphatase (APISO): 58 U/L (ref 37–153)
BUN: 9 mg/dL (ref 7–25)
CO2: 26 mmol/L (ref 20–32)
Calcium: 9.4 mg/dL (ref 8.6–10.4)
Chloride: 105 mmol/L (ref 98–110)
Creat: 0.74 mg/dL (ref 0.50–1.05)
Globulin: 6.7 g/dL (calc) — ABNORMAL HIGH (ref 1.9–3.7)
Glucose, Bld: 77 mg/dL (ref 65–99)
Potassium: 4 mmol/L (ref 3.5–5.3)
Sodium: 138 mmol/L (ref 135–146)
Total Bilirubin: 1 mg/dL (ref 0.2–1.2)
Total Protein: 9.9 g/dL — ABNORMAL HIGH (ref 6.1–8.1)

## 2021-08-29 LAB — T-HELPER CELLS (CD4) COUNT (NOT AT ARMC)
Absolute CD4: 179 cells/uL — ABNORMAL LOW (ref 490–1740)
CD4 T Helper %: 15 % — ABNORMAL LOW (ref 30–61)
Total lymphocyte count: 1173 cells/uL (ref 850–3900)

## 2021-08-29 LAB — HIV-1 RNA QUANT-NO REFLEX-BLD
HIV 1 RNA Quant: 1420 Copies/mL — ABNORMAL HIGH
HIV-1 RNA Quant, Log: 3.15 Log cps/mL — ABNORMAL HIGH

## 2021-08-29 LAB — RPR: RPR Ser Ql: NONREACTIVE

## 2021-08-29 LAB — C. TRACHOMATIS/N. GONORRHOEAE RNA
C. trachomatis RNA, TMA: NOT DETECTED
N. gonorrhoeae RNA, TMA: NOT DETECTED

## 2021-08-30 ENCOUNTER — Telehealth: Payer: Self-pay

## 2021-08-30 NOTE — Telephone Encounter (Signed)
Called patient to relay elevated viral load and CD4 count of 179. Patient states she does not take her medication, but is now concerned that she is detectable and says she will start her ART today.  ? ?She is asking about her kidney function, relayed that BUN and creatinine were normal. Patient verbalized understanding and has no further questions.  ? ?Beryle Flock, RN ? ?

## 2021-09-02 ENCOUNTER — Other Ambulatory Visit: Payer: Self-pay

## 2021-09-02 ENCOUNTER — Ambulatory Visit (INDEPENDENT_AMBULATORY_CARE_PROVIDER_SITE_OTHER): Payer: Medicare Other | Admitting: Infectious Diseases

## 2021-09-02 DIAGNOSIS — E875 Hyperkalemia: Secondary | ICD-10-CM | POA: Diagnosis not present

## 2021-09-02 DIAGNOSIS — B20 Human immunodeficiency virus [HIV] disease: Secondary | ICD-10-CM | POA: Diagnosis not present

## 2021-09-02 DIAGNOSIS — F329 Major depressive disorder, single episode, unspecified: Secondary | ICD-10-CM

## 2021-09-02 DIAGNOSIS — F32A Depression, unspecified: Secondary | ICD-10-CM | POA: Insufficient documentation

## 2021-09-02 NOTE — Progress Notes (Signed)
? ?Subjective:  ? ? Patient ID: Ruth Stephenson, female  DOB: Feb 02, 1951, 71 y.o.        MRN: [id number] ? ? ?HPI ?71yo F with HIV+ since 1989, on genvoya --> Symtuza. She also has a hx of Hep C treated (Harvoni) 2015. 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  ?In 08-2012 she had surgery for colon cancer (II).  ?She had CT abd 04-2017: Cirrhotic morphology of the liver. Hyperdense focus within the ?anterior left hepatic lobe ; when clinically feasible, evaluation ?with liver MRI is suggested, preferably as an outpatient when the patient is able to breath hold. ?She had a MRI ordered- which has not been done.  ?  ?She was seen in UC by Dr Renelda Loma [phone] for CP and L 3rd toe fracture. She was started on pepcid (she did not fill, no further CP). She was referred to [facility]- has been resched.  ?  ?Has been having issues with housing and was seen 08-26-21. Her fluid pill was refilled, her LE edema has improved.  ?Today she is upset as she is going to be "homeless any day". Her aunt has offered for her to stay there. She feels too proud to live with someone as an adult. Her  children, grandchildren are not local, are not able to help.  ?"I feel like I'm in a fog".  ?Also worried that her urine is dark.  ?Has been off art, feels like she is giving up.  ?Has food, food stamps. Appetite decreased.  ? ?Needs: mammo, pap, MRI liver, (does not want COVID vax), flu, pcv 20, labs ?  ?Quit smoking! ?HIV 1 RNA Quant  ?Date Value  ?08/26/2021 1,420 Copies/mL (H)  ?03/11/2021 <20 copies/mL  ?12/30/2019 <20 NOT DETECTED copies/mL  ? ?CD4 T Cell Abs (/uL)  ?Date Value  ?03/10/2021 116 (L)  ?12/30/2019 221 (L)  ?01/02/2018 240 (L)  ? ? ? ?Health Maintenance  ?Topic Date Due  ?? COVID-19 Vaccine (1) Never done  ?? TETANUS/TDAP  Never done  ?? Zoster Vaccines- Shingrix (1 of 2) Never done  ?? MAMMOGRAM  Never done  ?? DEXA SCAN  Never done  ?? COLONOSCOPY (Pts 45-54yr Insurance coverage will need to be confirmed)   09/21/2022  ?? Pneumonia Vaccine 70 Years old  Completed  ?? INFLUENZA VACCINE  Completed  ?? Hepatitis C Screening  Completed  ?? HPV VACCINES  Aged Out  ? ? ? ? ?Review of Systems  ?Constitutional:  Positive for malaise/fatigue.  ?Gastrointestinal:  Positive for constipation.  ?Psychiatric/Behavioral:  Positive for depression. The patient has insomnia.   ? ?Please see HPI. All other systems reviewed and negative. ? ?   ?Objective:  ?Physical Exam ?Constitutional:   ?   Appearance: Normal appearance. She is obese.  ?HENT:  ?   Mouth/Throat:  ?   Mouth: Mucous membranes are moist.  ?   Pharynx: No oropharyngeal exudate.  ?Eyes:  ?   Extraocular Movements: Extraocular movements intact.  ?   Pupils: Pupils are equal, round, and reactive to light.  ?Cardiovascular:  ?   Rate and Rhythm: Normal rate and regular rhythm.  ?Pulmonary:  ?   Effort: Pulmonary effort is normal.  ?   Breath sounds: Normal breath sounds.  ?Abdominal:  ?   General: Bowel sounds are normal. There is no distension.  ?   Palpations: Abdomen is soft.  ?   Tenderness: There is no abdominal tenderness.  ?Musculoskeletal:  ?  Cervical back: Normal range of motion.  ?   Right lower leg: No edema.  ?   Left lower leg: No edema.  ?Neurological:  ?   General: No focal deficit present.  ?   Mental Status: She is alert.  ?Psychiatric:     ?   Mood and Affect: Mood is anxious and depressed.  ? ? ? ? ? ? ?   ?Assessment & Plan:  ? ?

## 2021-09-02 NOTE — Assessment & Plan Note (Signed)
She has multiple stressors.  ?Will get her in with [facility] (asked her to go there directly) ?They can help her with housing.  ?Will get her in with counselor.  ?Will see her back in 2 weeks.  ?

## 2021-09-02 NOTE — Assessment & Plan Note (Signed)
Will check her BMP with restart of lasix.  ?

## 2021-09-03 LAB — BASIC METABOLIC PANEL
BUN: 9 mg/dL (ref 7–25)
CO2: 31 mmol/L (ref 20–32)
Calcium: 9.3 mg/dL (ref 8.6–10.4)
Chloride: 106 mmol/L (ref 98–110)
Creat: 0.77 mg/dL (ref 0.50–1.05)
Glucose, Bld: 108 mg/dL — ABNORMAL HIGH (ref 65–99)
Potassium: 4.5 mmol/L (ref 3.5–5.3)
Sodium: 141 mmol/L (ref 135–146)

## 2021-09-08 ENCOUNTER — Ambulatory Visit: Payer: Medicaid Other

## 2021-09-10 ENCOUNTER — Other Ambulatory Visit: Payer: Medicaid Other

## 2021-09-16 ENCOUNTER — Ambulatory Visit (INDEPENDENT_AMBULATORY_CARE_PROVIDER_SITE_OTHER): Payer: Medicare Other | Admitting: Infectious Diseases

## 2021-09-16 ENCOUNTER — Other Ambulatory Visit: Payer: Self-pay

## 2021-09-16 ENCOUNTER — Encounter: Payer: Self-pay | Admitting: Infectious Diseases

## 2021-09-16 VITALS — BP 124/69 | HR 60 | Temp 98.2°F | Ht 63.0 in | Wt 193.0 lb

## 2021-09-16 DIAGNOSIS — C182 Malignant neoplasm of ascending colon: Secondary | ICD-10-CM

## 2021-09-16 DIAGNOSIS — F1129 Opioid dependence with unspecified opioid-induced disorder: Secondary | ICD-10-CM

## 2021-09-16 DIAGNOSIS — Z23 Encounter for immunization: Secondary | ICD-10-CM

## 2021-09-16 DIAGNOSIS — E669 Obesity, unspecified: Secondary | ICD-10-CM | POA: Diagnosis not present

## 2021-09-16 DIAGNOSIS — K7469 Other cirrhosis of liver: Secondary | ICD-10-CM

## 2021-09-16 DIAGNOSIS — B20 Human immunodeficiency virus [HIV] disease: Secondary | ICD-10-CM

## 2021-09-16 MED ORDER — DARUN-COBIC-EMTRICIT-TENOFAF 800-150-200-10 MG PO TABS: 1.0000 | ORAL_TABLET | Freq: Every day | ORAL | 11 refills | Status: DC

## 2021-09-16 NOTE — Assessment & Plan Note (Signed)
Encouraged her to watch diet and exercise as she is able.  ?

## 2021-09-16 NOTE — Addendum Note (Signed)
Addended by: Lin Landsman E on: 09/16/2021 12:24 PM ? ? Modules accepted: Orders ? ?

## 2021-09-16 NOTE — Assessment & Plan Note (Signed)
Next colonoscopy 2024 ?

## 2021-09-16 NOTE — Addendum Note (Signed)
Addended by: Lucie Leather D on: 09/16/2021 11:58 AM ? ? Modules accepted: Orders ? ?

## 2021-09-16 NOTE — Assessment & Plan Note (Signed)
States she is tapering her mathadone at her clinic.  ?Her BM have become more regular but feels like she still has trouble sleeping.  ?Appreciate methadone clinic f/u.  ?

## 2021-09-16 NOTE — Assessment & Plan Note (Signed)
She is doing well , back on art ?Will check her CD4 and HIV RNA today make sure no resistance.  ?She will be sent for mammo, pap [facility] [place]) ?Will rtc in 2 months ?PCV 20 today ? ?

## 2021-09-16 NOTE — Progress Notes (Signed)
? ?Subjective:  ? ? Patient ID: Ruth Stephenson, female  DOB: 08-11-1950, 71 y.o.        MRN: [id number] ? ? ?HPI ?71yo F with HIV+ since 1989, on genvoya --> Symtuza. She also has a hx of Hep C treated (Harvoni) 2015. 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  ?In 08-2012 she had surgery for colon cancer (II).  ?She had CT abd 04-2017: Cirrhotic morphology of the liver. Hyperdense focus within the ?anterior left hepatic lobe ; when clinically feasible, evaluation ?with liver MRI is suggested, preferably as an outpatient when the patient is able to breath hold. ?She had a MRI ordered- which has not been done.  ?  ?She was in hospital 03-14-20 to 03-18-20 for COVID.  ?She was in hospital 02-2021 with Pneumococcal pna and non-adherence to meds.  ?  ?She was seen in UC by Dr Renelda Loma [phone] for CP and L 3rd toe fracture. She was started on pepcid (she did not fill, no further CP). She was referred to [facility]- has been resched.  ?  ?She has f/u at Methadone clinic. Was told she has a problem with her kidneys (I re-assure her that her Cr was normal at last checks).  ?  ?Quit smoking! ? ?CD4 179.  ?She was off meds when labs were drawn. She is back on meds now.  ?Has connected with [facility] to help her with housing. She is looking and needs to find somewhere in 5 days.  ?Her car has broken down and she needs to f/u with several appts.  ? ? ?HIV 1 RNA Quant  ?Date Value  ?08/26/2021 1,420 Copies/mL (H)  ?03/11/2021 <20 copies/mL  ?12/30/2019 <20 NOT DETECTED copies/mL  ? ?CD4 T Cell Abs (/uL)  ?Date Value  ?03/10/2021 116 (L)  ?12/30/2019 221 (L)  ?01/02/2018 240 (L)  ? ? ? ?Health Maintenance  ?Topic Date Due  ?? COVID-19 Vaccine (1) Never done  ?? TETANUS/TDAP  Never done  ?? Zoster Vaccines- Shingrix (1 of 2) Never done  ?? MAMMOGRAM  Never done  ?? DEXA SCAN  Never done  ?? COLONOSCOPY (Pts 45-57yr Insurance coverage will need to be confirmed)  09/21/2022  ?? Pneumonia Vaccine 70 Years old  Completed   ?? INFLUENZA VACCINE  Completed  ?? Hepatitis C Screening  Completed  ?? HPV VACCINES  Aged Out  ? ? ? ? ?Review of Systems  ?Constitutional:  Negative for chills, fever and weight loss.  ?Gastrointestinal:  Negative for constipation and diarrhea.  ?Genitourinary:  Negative for dysuria.  ?Psychiatric/Behavioral:  Negative for depression.   ? ?Please see HPI. All other systems reviewed and negative. ? ?   ?Objective:  ?Physical Exam ?Vitals reviewed.  ?Constitutional:   ?   General: She is not in acute distress. ?   Appearance: She is obese. She is not toxic-appearing.  ?HENT:  ?   Mouth/Throat:  ?   Mouth: Mucous membranes are moist.  ?   Pharynx: No oropharyngeal exudate.  ?Eyes:  ?   Extraocular Movements: Extraocular movements intact.  ?   Pupils: Pupils are equal, round, and reactive to light.  ?Cardiovascular:  ?   Rate and Rhythm: Normal rate and regular rhythm.  ?Pulmonary:  ?   Effort: Pulmonary effort is normal.  ?   Breath sounds: Normal breath sounds.  ?Abdominal:  ?   General: Bowel sounds are normal. There is no distension.  ?   Palpations: Abdomen  is soft.  ?   Tenderness: There is no abdominal tenderness.  ?Musculoskeletal:  ?   Cervical back: Normal range of motion.  ?   Right lower leg: Edema present.  ?   Left lower leg: Edema present.  ?Neurological:  ?   Mental Status: She is alert.  ? ? ? ? ? ?   ?Assessment & Plan:  ? ?

## 2021-09-16 NOTE — Assessment & Plan Note (Signed)
Will f/u to get resched.  ?

## 2021-09-19 LAB — T-HELPER CELLS (CD4) COUNT (NOT AT ARMC)
Absolute CD4: 173 cells/uL — ABNORMAL LOW (ref 490–1740)
CD4 T Helper %: 14 % — ABNORMAL LOW (ref 30–61)
Total lymphocyte count: 1260 cells/uL (ref 850–3900)

## 2021-09-19 LAB — TSH: TSH: 3.98 mIU/L (ref 0.40–4.50)

## 2021-09-19 LAB — HIV-1 RNA ULTRAQUANT REFLEX TO GENTYP+
HIV 1 RNA Quant: 105 copies/mL — ABNORMAL HIGH
HIV-1 RNA Quant, Log: 2.02 Log copies/mL — ABNORMAL HIGH

## 2021-10-03 ENCOUNTER — Telehealth: Payer: Self-pay

## 2021-10-03 NOTE — Telephone Encounter (Signed)
Patient walked into clinic today requesting letter for housing stating she needs a place with two bedrooms. Informed patient that I would forward message to provider. Will call with update. ?Leatrice Jewels, RMA ? ?

## 2021-10-05 ENCOUNTER — Ambulatory Visit: Payer: Medicare Other

## 2021-10-11 ENCOUNTER — Encounter: Payer: Self-pay | Admitting: Family

## 2021-10-11 ENCOUNTER — Other Ambulatory Visit (HOSPITAL_COMMUNITY): Payer: Self-pay

## 2021-10-13 ENCOUNTER — Ambulatory Visit: Payer: Medicare Other | Admitting: Infectious Diseases

## 2021-10-17 ENCOUNTER — Encounter: Payer: Self-pay | Admitting: Infectious Diseases

## 2021-10-20 ENCOUNTER — Other Ambulatory Visit: Payer: Self-pay | Admitting: Infectious Diseases

## 2021-10-20 DIAGNOSIS — B20 Human immunodeficiency virus [HIV] disease: Secondary | ICD-10-CM

## 2021-10-20 DIAGNOSIS — Z1231 Encounter for screening mammogram for malignant neoplasm of breast: Secondary | ICD-10-CM

## 2021-10-25 ENCOUNTER — Ambulatory Visit: Payer: Medicare Other

## 2021-11-17 ENCOUNTER — Ambulatory Visit: Payer: Medicare Other | Admitting: Infectious Diseases

## 2021-11-25 ENCOUNTER — Other Ambulatory Visit: Payer: Self-pay

## 2021-11-25 ENCOUNTER — Encounter: Payer: Self-pay | Admitting: Infectious Diseases

## 2021-11-25 ENCOUNTER — Ambulatory Visit (INDEPENDENT_AMBULATORY_CARE_PROVIDER_SITE_OTHER): Payer: Medicare Other | Admitting: Infectious Diseases

## 2021-11-25 ENCOUNTER — Ambulatory Visit: Payer: Medicare Other | Admitting: Infectious Diseases

## 2021-11-25 VITALS — BP 133/78 | HR 63 | Temp 97.4°F | Ht 63.0 in | Wt 195.0 lb

## 2021-11-25 DIAGNOSIS — K760 Fatty (change of) liver, not elsewhere classified: Secondary | ICD-10-CM

## 2021-11-25 DIAGNOSIS — Z113 Encounter for screening for infections with a predominantly sexual mode of transmission: Secondary | ICD-10-CM

## 2021-11-25 DIAGNOSIS — F1129 Opioid dependence with unspecified opioid-induced disorder: Secondary | ICD-10-CM

## 2021-11-25 DIAGNOSIS — K7469 Other cirrhosis of liver: Secondary | ICD-10-CM | POA: Diagnosis not present

## 2021-11-25 DIAGNOSIS — Z79899 Other long term (current) drug therapy: Secondary | ICD-10-CM | POA: Diagnosis not present

## 2021-11-25 DIAGNOSIS — B20 Human immunodeficiency virus [HIV] disease: Secondary | ICD-10-CM | POA: Diagnosis not present

## 2021-11-25 DIAGNOSIS — B182 Chronic viral hepatitis C: Secondary | ICD-10-CM

## 2021-11-25 DIAGNOSIS — C182 Malignant neoplasm of ascending colon: Secondary | ICD-10-CM

## 2021-11-25 DIAGNOSIS — Z85038 Personal history of other malignant neoplasm of large intestine: Secondary | ICD-10-CM

## 2021-11-25 MED ORDER — DOVATO 50-300 MG PO TABS: 1.0000 | ORAL_TABLET | Freq: Every day | ORAL | 3 refills | Status: DC

## 2021-11-25 NOTE — Progress Notes (Signed)
Subjective:    Patient ID: Ruth Stephenson, female  DOB: 07/12/1950, 71 y.o.        MRN: [id number]   HPI 71yo F with HIV+ since 1989, on genvoya --> Symtuza. (Genotype 2014 naive) She also has a hx of Hep C treated (Harvoni) 2015. 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  In 08-2012 she had surgery for colon cancer (II).  She had CT abd 04-2017: Cirrhotic morphology of the liver. Hyperdense focus within the anterior left hepatic lobe ; when clinically feasible, evaluation with liver MRI is suggested, preferably as an outpatient when the patient is able to breath hold.   She was in hospital 03-14-20 to 03-18-20 for COVID.  She was in hospital 02-2021 with Pneumococcal pna and non-adherence to meds.    She was seen in UC by Dr Renelda Loma [phone] for CP and L 3rd toe fracture. She was started on pepcid (she did not fill, no further CP). She was referred to [facility]- has been resched.    She has f/u at Methadone clinic. Down to Methadone '20mg'$ , been feeling well.    CD4 179 (08-2021).  She was off meds when labs were drawn.  Today c/o that she has had SOB and DOE.  She c/o wt gain and that her SOB is due to this.  Abd girth is "up and down".    HIV 1 RNA Quant  Date Value  09/16/2021 105 copies/mL (H)  08/26/2021 1,420 Copies/mL (H)  03/11/2021 <20 copies/mL   CD4 T Cell Abs (/uL)  Date Value  03/10/2021 116 (L)  12/30/2019 221 (L)  01/02/2018 240 (L)     Health Maintenance  Topic Date Due  . COVID-19 Vaccine (1) Never done  . TETANUS/TDAP  Never done  . Zoster Vaccines- Shingrix (1 of 2) Never done  . MAMMOGRAM  Never done  . DEXA SCAN  Never done  . INFLUENZA VACCINE  01/24/2022  . COLONOSCOPY (Pts 45-80yr Insurance coverage will need to be confirmed)  09/21/2022  . Pneumonia Vaccine 70 Years old  Completed  . Hepatitis C Screening  Completed  . HPV VACCINES  Aged Out      Review of Systems  Constitutional:  Negative for chills, fever and  weight loss.  Respiratory:  Positive for shortness of breath. Negative for cough.   Cardiovascular:  Negative for chest pain and leg swelling.  Gastrointestinal:  Negative for blood in stool and constipation.  Genitourinary:  Negative for dysuria.   Please see HPI. All other systems reviewed and negative.     Objective:  Physical Exam Vitals reviewed.  Constitutional:      Appearance: Normal appearance.  HENT:     Mouth/Throat:     Mouth: Mucous membranes are moist.     Pharynx: No oropharyngeal exudate.  Eyes:     Extraocular Movements: Extraocular movements intact.     Pupils: Pupils are equal, round, and reactive to light.  Cardiovascular:     Rate and Rhythm: Normal rate and regular rhythm.  Pulmonary:     Effort: Pulmonary effort is normal.     Breath sounds: Normal breath sounds.  Abdominal:     General: Bowel sounds are normal. There is no distension.     Palpations: Abdomen is soft.     Tenderness: There is no abdominal tenderness.  Musculoskeletal:        General: Normal range of motion.     Cervical back: Normal  range of motion and neck supple.     Right lower leg: Edema present.     Left lower leg: Edema present.  Neurological:     General: No focal deficit present.     Mental Status: She is alert.          Assessment & Plan:

## 2021-11-25 NOTE — Assessment & Plan Note (Signed)
Will check her MRI of liver.  Repeat her Hep C RNA.

## 2021-11-25 NOTE — Patient Instructions (Signed)
To schedule your mammogram, call: [phone]  We will be in contact with you next week to schedule your MRI. If you do not hear from [place] by Tuesday next week - please give [place] a call

## 2021-11-25 NOTE — Assessment & Plan Note (Signed)
Will change her to dovato to see if it helps with her wt gain.  Her vax are up to date Will order ammo Check labs today Will see her back in 2-3 months.

## 2021-11-25 NOTE — Assessment & Plan Note (Signed)
Pt wants onc f/u, placed.

## 2021-11-25 NOTE — Assessment & Plan Note (Signed)
Continues to follow at methadone clinic.  Asx, has some constipation.

## 2021-11-28 LAB — COMPREHENSIVE METABOLIC PANEL
AG Ratio: 0.7 (calc) — ABNORMAL LOW (ref 1.0–2.5)
ALT: 9 U/L (ref 6–29)
AST: 27 U/L (ref 10–35)
Albumin: 3.3 g/dL — ABNORMAL LOW (ref 3.6–5.1)
Alkaline phosphatase (APISO): 73 U/L (ref 37–153)
BUN: 10 mg/dL (ref 7–25)
CO2: 28 mmol/L (ref 20–32)
Calcium: 8.8 mg/dL (ref 8.6–10.4)
Chloride: 105 mmol/L (ref 98–110)
Creat: 0.76 mg/dL (ref 0.50–1.05)
Globulin: 4.7 g/dL (calc) — ABNORMAL HIGH (ref 1.9–3.7)
Glucose, Bld: 105 mg/dL — ABNORMAL HIGH (ref 65–99)
Potassium: 4.2 mmol/L (ref 3.5–5.3)
Sodium: 139 mmol/L (ref 135–146)
Total Bilirubin: 1 mg/dL (ref 0.2–1.2)
Total Protein: 8 g/dL (ref 6.1–8.1)

## 2021-11-28 LAB — T-HELPER CELLS (CD4) COUNT (NOT AT ARMC)
Absolute CD4: 146 cells/uL — ABNORMAL LOW (ref 490–1740)
CD4 T Helper %: 21 % — ABNORMAL LOW (ref 30–61)
Total lymphocyte count: 695 cells/uL — ABNORMAL LOW (ref 850–3900)

## 2021-11-28 LAB — HIV-1 RNA QUANT-NO REFLEX-BLD
HIV 1 RNA Quant: 33 Copies/mL — ABNORMAL HIGH
HIV-1 RNA Quant, Log: 1.52 Log cps/mL — ABNORMAL HIGH

## 2021-11-28 LAB — CBC
HCT: 34.6 % — ABNORMAL LOW (ref 35.0–45.0)
Hemoglobin: 11.8 g/dL (ref 11.7–15.5)
MCH: 33 pg (ref 27.0–33.0)
MCHC: 34.1 g/dL (ref 32.0–36.0)
MCV: 96.6 fL (ref 80.0–100.0)
MPV: 10.6 fL (ref 7.5–12.5)
Platelets: 66 10*3/uL — ABNORMAL LOW (ref 140–400)
RBC: 3.58 10*6/uL — ABNORMAL LOW (ref 3.80–5.10)
RDW: 13 % (ref 11.0–15.0)
WBC: 1.4 10*3/uL — ABNORMAL LOW (ref 3.8–10.8)

## 2021-11-28 LAB — HEPATITIS C RNA QUANTITATIVE
HCV Quantitative Log: <1.18 log IU/mL
HCV RNA, PCR, QN: <15 IU/mL

## 2021-11-29 ENCOUNTER — Telehealth: Payer: Self-pay | Admitting: Oncology

## 2021-11-29 NOTE — Telephone Encounter (Signed)
Attempted to reach out to patient in regards to referral but no answer so voicemail was left for patient to call back

## 2021-11-30 ENCOUNTER — Telehealth: Payer: Self-pay

## 2021-11-30 NOTE — Telephone Encounter (Signed)
Patient returned call and advised of CBC needing to be repeated. Patient scheduled

## 2021-11-30 NOTE — Telephone Encounter (Signed)
-----   Message from Campbell Riches, MD sent at 11/30/2021  9:54 AM EDT ----- Can we get a repeat CBC on Ruth Stephenson? Thanks Merry Proud  ----- Message ----- From: Cheyenne Adas Lab Results In Sent: 11/26/2021   2:43 AM EDT To: Campbell Riches, MD

## 2021-11-30 NOTE — Telephone Encounter (Signed)
Called patient to schedule labs, no answer and voicemail full.  Beryle Flock, RN

## 2021-12-05 ENCOUNTER — Other Ambulatory Visit: Payer: Self-pay

## 2021-12-05 ENCOUNTER — Other Ambulatory Visit: Payer: Medicare Other

## 2021-12-05 ENCOUNTER — Telehealth: Payer: Self-pay

## 2021-12-05 DIAGNOSIS — B20 Human immunodeficiency virus [HIV] disease: Secondary | ICD-10-CM

## 2021-12-05 NOTE — Telephone Encounter (Signed)
[facility] lab tech was unable to obtain enough sample for HIV VL and CD4 count. Patient agrees to  increase fluids and reschedule lab visit for tomorrow. Patient verbalized understanding for the need to return.   Ruth Stephenson

## 2021-12-06 ENCOUNTER — Other Ambulatory Visit: Payer: Medicare Other

## 2021-12-06 LAB — LIPID PANEL
Cholesterol: 134 mg/dL (ref <200)
HDL: 56 mg/dL (ref >=50)
LDL Cholesterol (Calc): 64 mg/dL (calc)
Non-HDL Cholesterol (Calc): 78 mg/dL (calc) (ref <130)
Total CHOL/HDL Ratio: 2.4 (calc) (ref <5.0)
Triglycerides: 68 mg/dL (ref <150)

## 2021-12-06 LAB — COMPREHENSIVE METABOLIC PANEL
AG Ratio: 0.8 (calc) — ABNORMAL LOW (ref 1.0–2.5)
ALT: 10 U/L (ref 6–29)
AST: 22 U/L (ref 10–35)
Albumin: 3.3 g/dL — ABNORMAL LOW (ref 3.6–5.1)
Alkaline phosphatase (APISO): 75 U/L (ref 37–153)
BUN: 8 mg/dL (ref 7–25)
CO2: 28 mmol/L (ref 20–32)
Calcium: 9.2 mg/dL (ref 8.6–10.4)
Chloride: 105 mmol/L (ref 98–110)
Creat: 0.63 mg/dL (ref 0.50–1.05)
Globulin: 4.4 g/dL (calc) — ABNORMAL HIGH (ref 1.9–3.7)
Glucose, Bld: 142 mg/dL — ABNORMAL HIGH (ref 65–99)
Potassium: 4.1 mmol/L (ref 3.5–5.3)
Sodium: 139 mmol/L (ref 135–146)
Total Bilirubin: 1 mg/dL (ref 0.2–1.2)
Total Protein: 7.7 g/dL (ref 6.1–8.1)

## 2021-12-06 LAB — RPR: RPR Ser Ql: NONREACTIVE

## 2021-12-07 ENCOUNTER — Other Ambulatory Visit (HOSPITAL_COMMUNITY): Payer: Self-pay

## 2021-12-14 ENCOUNTER — Encounter: Payer: Self-pay | Admitting: Infectious Diseases

## 2021-12-16 ENCOUNTER — Ambulatory Visit (INDEPENDENT_AMBULATORY_CARE_PROVIDER_SITE_OTHER): Payer: Medicare Other | Admitting: Infectious Diseases

## 2021-12-16 ENCOUNTER — Other Ambulatory Visit: Payer: Self-pay

## 2021-12-16 ENCOUNTER — Encounter: Payer: Self-pay | Admitting: Infectious Diseases

## 2021-12-16 VITALS — BP 114/74 | HR 69 | Temp 98.3°F | Wt 191.0 lb

## 2021-12-16 DIAGNOSIS — Z79899 Other long term (current) drug therapy: Secondary | ICD-10-CM | POA: Diagnosis not present

## 2021-12-16 DIAGNOSIS — R600 Localized edema: Secondary | ICD-10-CM

## 2021-12-16 DIAGNOSIS — Z113 Encounter for screening for infections with a predominantly sexual mode of transmission: Secondary | ICD-10-CM

## 2021-12-16 DIAGNOSIS — B20 Human immunodeficiency virus [HIV] disease: Secondary | ICD-10-CM | POA: Diagnosis not present

## 2021-12-16 DIAGNOSIS — K7469 Other cirrhosis of liver: Secondary | ICD-10-CM

## 2021-12-19 ENCOUNTER — Inpatient Hospital Stay: Admission: RE | Admit: 2021-12-19 | Payer: Medicare Other | Source: Ambulatory Visit

## 2021-12-20 LAB — HIV-1 RNA QUANT-NO REFLEX-BLD
HIV 1 RNA Quant: NOT DETECTED Copies/mL
HIV-1 RNA Quant, Log: NOT DETECTED Log cps/mL

## 2021-12-20 LAB — COMPREHENSIVE METABOLIC PANEL
AG Ratio: 0.7 (calc) — ABNORMAL LOW (ref 1.0–2.5)
ALT: 10 U/L (ref 6–29)
AST: 23 U/L (ref 10–35)
Albumin: 3.3 g/dL — ABNORMAL LOW (ref 3.6–5.1)
Alkaline phosphatase (APISO): 73 U/L (ref 37–153)
BUN: 9 mg/dL (ref 7–25)
CO2: 30 mmol/L (ref 20–32)
Calcium: 9 mg/dL (ref 8.6–10.4)
Chloride: 106 mmol/L (ref 98–110)
Creat: 0.72 mg/dL (ref 0.50–1.05)
Globulin: 4.7 g/dL (calc) — ABNORMAL HIGH (ref 1.9–3.7)
Glucose, Bld: 141 mg/dL — ABNORMAL HIGH (ref 65–99)
Potassium: 4.2 mmol/L (ref 3.5–5.3)
Sodium: 140 mmol/L (ref 135–146)
Total Bilirubin: 1 mg/dL (ref 0.2–1.2)
Total Protein: 8 g/dL (ref 6.1–8.1)

## 2021-12-20 LAB — T-HELPER CELLS (CD4) COUNT (NOT AT ARMC)
Absolute CD4: 126 cells/uL — ABNORMAL LOW (ref 490–1740)
CD4 T Helper %: 19 % — ABNORMAL LOW (ref 30–61)
Total lymphocyte count: 652 cells/uL — ABNORMAL LOW (ref 850–3900)

## 2021-12-23 ENCOUNTER — Inpatient Hospital Stay: Payer: Medicare Other | Attending: Oncology | Admitting: Oncology

## 2021-12-23 ENCOUNTER — Telehealth: Payer: Self-pay

## 2021-12-23 NOTE — Telephone Encounter (Signed)
Patient called office to follow up on lab results from office visit. Informed patient that viral load is undetectable. CD4 count did drop but would repeat labs at next appointment. Encouraged patient to continue to take medication as prescribed.  Patient is requesting to follow up with Dr. Johnnye Sima at Internal Medicine. Provided patient with number for that office to get scheduled.   Patient understands that if she transfers care to internal medicine clinic she will not qualify to services at [facility]. Verbalized understanding. Leatrice Jewels, RMA

## 2021-12-28 ENCOUNTER — Telehealth: Payer: Self-pay | Admitting: Oncology

## 2021-12-28 NOTE — Telephone Encounter (Signed)
Attempted to contact patient in attempts to rescheduled missed appointment on 6/30. No answer but voicemail was left for patient to call back

## 2022-01-03 ENCOUNTER — Other Ambulatory Visit: Payer: Medicare Other

## 2022-01-04 ENCOUNTER — Ambulatory Visit: Payer: Medicare Other | Admitting: Infectious Diseases

## 2022-01-16 ENCOUNTER — Inpatient Hospital Stay: Payer: Medicare Other | Attending: Oncology | Admitting: Oncology

## 2022-01-16 ENCOUNTER — Telehealth: Payer: Self-pay

## 2022-01-16 NOTE — Telephone Encounter (Signed)
TC to Pt to inquire about missed appointment today. V/M message left.

## 2022-01-17 ENCOUNTER — Inpatient Hospital Stay: Admission: RE | Admit: 2022-01-17 | Payer: Medicare Other | Source: Ambulatory Visit

## 2022-02-01 ENCOUNTER — Encounter: Payer: Self-pay | Admitting: Infectious Diseases

## 2022-02-01 ENCOUNTER — Other Ambulatory Visit: Payer: Self-pay

## 2022-02-01 ENCOUNTER — Ambulatory Visit (INDEPENDENT_AMBULATORY_CARE_PROVIDER_SITE_OTHER): Payer: Medicare Other | Admitting: Infectious Diseases

## 2022-02-01 VITALS — BP 98/73 | HR 63 | Temp 97.7°F | Resp 28 | Ht 63.0 in | Wt 192.9 lb

## 2022-02-01 DIAGNOSIS — F1129 Opioid dependence with unspecified opioid-induced disorder: Secondary | ICD-10-CM | POA: Diagnosis not present

## 2022-02-01 DIAGNOSIS — B20 Human immunodeficiency virus [HIV] disease: Secondary | ICD-10-CM | POA: Diagnosis not present

## 2022-02-01 DIAGNOSIS — K7460 Unspecified cirrhosis of liver: Secondary | ICD-10-CM | POA: Diagnosis not present

## 2022-02-01 DIAGNOSIS — Z113 Encounter for screening for infections with a predominantly sexual mode of transmission: Secondary | ICD-10-CM

## 2022-02-01 DIAGNOSIS — Z23 Encounter for immunization: Secondary | ICD-10-CM

## 2022-02-01 DIAGNOSIS — R600 Localized edema: Secondary | ICD-10-CM

## 2022-02-01 DIAGNOSIS — R079 Chest pain, unspecified: Secondary | ICD-10-CM | POA: Insufficient documentation

## 2022-02-01 DIAGNOSIS — K7469 Other cirrhosis of liver: Secondary | ICD-10-CM

## 2022-02-01 DIAGNOSIS — Z79899 Other long term (current) drug therapy: Secondary | ICD-10-CM

## 2022-02-01 DIAGNOSIS — E875 Hyperkalemia: Secondary | ICD-10-CM

## 2022-02-01 NOTE — Assessment & Plan Note (Signed)
Will repeat her u/s

## 2022-02-01 NOTE — Addendum Note (Signed)
Addended by: Sander Nephew F on: 02/01/2022 03:50 PM   Modules accepted: Orders

## 2022-02-01 NOTE — Addendum Note (Signed)
Addended by: Juliani Laduke C on: 02/01/2022 03:37 PM   Modules accepted: Orders

## 2022-02-01 NOTE — Assessment & Plan Note (Addendum)
She continues on methadone.  Doing well.

## 2022-02-01 NOTE — Assessment & Plan Note (Signed)
Continue on lasix She is going to f/u with [facility].

## 2022-02-01 NOTE — Assessment & Plan Note (Signed)
She had CP with her previous urgent care eval.  She is going to call [facility] for f/u.  As well needs eval of her persistent LE edema (and skin changes that seem consistent with CHF).

## 2022-02-01 NOTE — Assessment & Plan Note (Signed)
Normal lasr visit.  Will continue to monitor.

## 2022-02-01 NOTE — Assessment & Plan Note (Signed)
Doing well with dovato Spoke about cabaneuva. She s not interested Offered/refused condoms.  Will send for mammo Needs Tdap Rtc in 6 months.

## 2022-02-01 NOTE — Progress Notes (Signed)
Subjective:    Patient ID: Ruth Stephenson, female  DOB: December 09, 1950, 71 y.o.        MRN: [id number]   HPI 71yo F with HIV+ since 1989, on genvoya --> Symtuza. (Genotype 2014 naive)--> dovato (11-2021).  She also has a hx of Hep C treated (Harvoni) 2015. 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  In 08-2012 she had surgery for colon cancer (II).  She had CT abd 04-2017: Cirrhotic morphology of the liver. Hyperdense focus within the anterior left hepatic lobe ; when clinically feasible, evaluation with liver MRI is suggested, preferably as an outpatient when the patient is able to breath hold.   She was in hospital 03-14-20 to 03-18-20 for COVID.  She was in hospital 02-2021 with Pneumococcal pna and non-adherence to meds.    She was seen in UC by Dr Renelda Loma [phone] for CP and L 3rd toe fracture. She was started on pepcid (she did not fill, no further CP). She was referred to [facility]- has been resched.    She has f/u at Methadone clinic. Down to Methadone '20mg'$ .     Her Hep C RNA was (-) (11-2021).   She continues to feel well on the dovato. More energy.   Continues to have LE edema and taking PRN lasix. No sob.  Feels like her knees are not shaped like they used to be.   C/o pruritis in le. Does a lot of yard work, worried that she has poison ivy. Has had for 1 week.   Still issues with her housing. On disability. Got a job at [employer] but is worried she will lose her medicaid, disability.     CD4 (11-2021) 126.  HIV 1 RNA Quant  Date Value  12/16/2021 Not Detected Copies/mL  11/25/2021 33 Copies/mL (H)  09/16/2021 105 copies/mL (H)   CD4 T Cell Abs (/uL)  Date Value  03/10/2021 116 (L)  12/30/2019 221 (L)  01/02/2018 240 (L)     Health Maintenance  Topic Date Due  . COVID-19 Vaccine (1) Never done  . TETANUS/TDAP  Never done  . Zoster Vaccines- Shingrix (1 of 2) Never done  . MAMMOGRAM  Never done  . DEXA SCAN  Never done  . INFLUENZA VACCINE   01/24/2022  . COLONOSCOPY (Pts 45-56yr Insurance coverage will need to be confirmed)  09/21/2022  . Pneumonia Vaccine 70 Years old  Completed  . Hepatitis C Screening  Completed  . HPV VACCINES  Aged Out      Review of Systems  Constitutional:  Negative for chills, fever and weight loss.  Respiratory:  Negative for cough and shortness of breath.   Cardiovascular:  Negative for chest pain.  Gastrointestinal:  Negative for constipation and diarrhea.  Genitourinary:  Negative for dysuria.    Please see HPI. All other systems reviewed and negative.     Objective:  Physical Exam Vitals reviewed.  Constitutional:      Appearance: Normal appearance. She is obese.  HENT:     Mouth/Throat:     Mouth: Mucous membranes are moist.     Pharynx: No oropharyngeal exudate.  Eyes:     Extraocular Movements: Extraocular movements intact.     Pupils: Pupils are equal, round, and reactive to light.  Cardiovascular:     Rate and Rhythm: Normal rate and regular rhythm.  Pulmonary:     Effort: Pulmonary effort is normal.     Breath sounds: Normal breath sounds.  Abdominal:     General: Bowel sounds are normal. There is no distension.     Palpations: Abdomen is soft.     Tenderness: There is no abdominal tenderness.  Musculoskeletal:     Cervical back: Normal range of motion.     Right lower leg: Edema present.     Left lower leg: Edema present.  Lymphadenopathy:     Cervical: No cervical adenopathy.  Neurological:     General: No focal deficit present.     Mental Status: She is alert.  Psychiatric:        Mood and Affect: Mood normal.          Assessment & Plan:

## 2022-02-23 ENCOUNTER — Inpatient Hospital Stay: Payer: Medicare Other | Attending: Oncology | Admitting: Oncology

## 2022-05-06 ENCOUNTER — Emergency Department (HOSPITAL_COMMUNITY)
Admission: EM | Admit: 2022-05-06 | Discharge: 2022-05-07 | Disposition: A | Discharge status: Home / Self Care | Payer: Medicare Other | Attending: Emergency Medicine | Admitting: Emergency Medicine

## 2022-05-06 ENCOUNTER — Encounter (HOSPITAL_COMMUNITY): Payer: Self-pay

## 2022-05-06 ENCOUNTER — Other Ambulatory Visit: Payer: Self-pay

## 2022-05-06 ENCOUNTER — Emergency Department (HOSPITAL_COMMUNITY): Payer: Medicare Other

## 2022-05-06 DIAGNOSIS — D72819 Decreased white blood cell count, unspecified: Secondary | ICD-10-CM | POA: Insufficient documentation

## 2022-05-06 DIAGNOSIS — R002 Palpitations: Secondary | ICD-10-CM | POA: Diagnosis not present

## 2022-05-06 DIAGNOSIS — R61 Generalized hyperhidrosis: Secondary | ICD-10-CM | POA: Diagnosis not present

## 2022-05-06 DIAGNOSIS — R0789 Other chest pain: Secondary | ICD-10-CM | POA: Diagnosis not present

## 2022-05-06 DIAGNOSIS — Z20822 Contact with and (suspected) exposure to covid-19: Secondary | ICD-10-CM | POA: Diagnosis not present

## 2022-05-06 DIAGNOSIS — R0602 Shortness of breath: Secondary | ICD-10-CM | POA: Insufficient documentation

## 2022-05-06 DIAGNOSIS — R55 Syncope and collapse: Secondary | ICD-10-CM | POA: Insufficient documentation

## 2022-05-06 DIAGNOSIS — M7989 Other specified soft tissue disorders: Secondary | ICD-10-CM | POA: Insufficient documentation

## 2022-05-06 DIAGNOSIS — R42 Dizziness and giddiness: Secondary | ICD-10-CM | POA: Diagnosis not present

## 2022-05-06 DIAGNOSIS — Z21 Asymptomatic human immunodeficiency virus [HIV] infection status: Secondary | ICD-10-CM | POA: Insufficient documentation

## 2022-05-06 LAB — URINALYSIS, ROUTINE W REFLEX MICROSCOPIC
Bilirubin Urine: NEGATIVE
Glucose, UA: NEGATIVE mg/dL
Hgb urine dipstick: NEGATIVE
Ketones, ur: NEGATIVE mg/dL
Nitrite: NEGATIVE
Protein, ur: NEGATIVE mg/dL
Specific Gravity, Urine: 1.014 (ref 1.005–1.030)
pH: 6 (ref 5.0–8.0)

## 2022-05-06 LAB — BRAIN NATRIURETIC PEPTIDE: B Natriuretic Peptide: 101 pg/mL — ABNORMAL HIGH (ref 0.0–100.0)

## 2022-05-06 LAB — COMPREHENSIVE METABOLIC PANEL
ALT: 11 U/L (ref 0–44)
AST: 26 U/L (ref 15–41)
Albumin: 3.1 g/dL — ABNORMAL LOW (ref 3.5–5.0)
Alkaline Phosphatase: 62 U/L (ref 38–126)
Anion gap: 8 (ref 5–15)
BUN: 7 mg/dL — ABNORMAL LOW (ref 8–23)
CO2: 28 mmol/L (ref 22–32)
Calcium: 9.2 mg/dL (ref 8.9–10.3)
Chloride: 104 mmol/L (ref 98–111)
Creatinine, Ser: 0.76 mg/dL (ref 0.44–1.00)
GFR, Estimated: >60 mL/min (ref >60)
Glucose, Bld: 174 mg/dL — ABNORMAL HIGH (ref 70–99)
Potassium: 4.4 mmol/L (ref 3.5–5.1)
Sodium: 140 mmol/L (ref 135–145)
Total Bilirubin: 0.8 mg/dL (ref 0.3–1.2)
Total Protein: 7.2 g/dL (ref 6.5–8.1)

## 2022-05-06 LAB — CBC
HCT: 38.2 % (ref 36.0–46.0)
Hemoglobin: 12.7 g/dL (ref 12.0–15.0)
MCH: 34.8 pg — ABNORMAL HIGH (ref 26.0–34.0)
MCHC: 33.2 g/dL (ref 30.0–36.0)
MCV: 104.7 fL — ABNORMAL HIGH (ref 80.0–100.0)
Platelets: 66 10*3/uL — ABNORMAL LOW (ref 150–400)
RBC: 3.65 MIL/uL — ABNORMAL LOW (ref 3.87–5.11)
RDW: 12.9 % (ref 11.5–15.5)
WBC: 1.6 10*3/uL — ABNORMAL LOW (ref 4.0–10.5)
nRBC: 0 % (ref 0.0–0.2)

## 2022-05-06 LAB — TROPONIN I (HIGH SENSITIVITY)
Troponin I (High Sensitivity): 7 ng/L (ref <18)
Troponin I (High Sensitivity): 7 ng/L (ref <18)

## 2022-05-06 NOTE — ED Provider Triage Note (Signed)
Emergency Medicine Provider Triage Evaluation Note  Ruth Stephenson , a 71 y.o. female  was evaluated in triage.  Pt reports that she woke up this morning at 2:30 AM with night sweats.  Patient reports that she then went and got into the toilet, had trouble getting off the toilet and not her daughter assist her off the toilet.  Patient reports this time that she felt very hot, lightheaded and dizzy.  Patient reports that EMS came out, gave her breathing treatment and she felt better.  Patient reports that the feeling then began to return today and she presented for evaluation.  Patient reports 1 episode of chest pain earlier this morning which dissipated, was not associated with shortness of breath.  Patient also complaining of peripheral edema, denies history of CHF.  Patient does have history of hepatitis C, HIV.  Patient denies any fevers, nausea or vomiting, diarrhea, abdominal pain.  Patient denies any one-sided weakness or numbness, slurred speech, facial droop.  Review of Systems  Positive:  Negative:   Physical Exam  BP (!) 144/64 (BP Location: Right Arm)   Pulse 66   Temp 98.2 F (36.8 C) (Oral)   Resp 19   Ht '5\' 3"'$  (1.6 m)   Wt 83.9 kg   SpO2 99%   BMI 32.77 kg/m  Gen:   Awake, no distress   Resp:  Normal effort  MSK:   Moves extremities without difficulty  Other:  Neurological examination shows no focal neurodeficits.  Medical Decision Making  Medically screening exam initiated at 5:36 PM.  Appropriate orders placed.  Ruth Stephenson was informed that the remainder of the evaluation will be completed by another provider, this initial triage assessment does not replace that evaluation, and the importance of remaining in the ED until their evaluation is complete.     Azucena Cecil, PA-C 05/06/22 1737

## 2022-05-06 NOTE — ED Triage Notes (Signed)
Pt reports she woke up today ay 2am with night sweats, trembling, trouble breathing and walking. She called 911 and was given a breathing treatment which made her feel better so she did not come to the ER. She reports she feels dizzy, blurry vision, swelling to bilateral hands and feet. Denies numbness/tingling. Pt speaking clear complete sentences. A&OX4.

## 2022-05-07 ENCOUNTER — Emergency Department (HOSPITAL_COMMUNITY): Payer: Medicare Other

## 2022-05-07 DIAGNOSIS — R55 Syncope and collapse: Secondary | ICD-10-CM | POA: Diagnosis not present

## 2022-05-07 LAB — DIFFERENTIAL
Abs Immature Granulocytes: 0 10*3/uL (ref 0.00–0.07)
Basophils Absolute: 0 10*3/uL (ref 0.0–0.1)
Basophils Relative: 0 %
Eosinophils Absolute: 0 10*3/uL (ref 0.0–0.5)
Eosinophils Relative: 0 %
Lymphocytes Relative: 35 %
Lymphs Abs: 0.6 10*3/uL — ABNORMAL LOW (ref 0.7–4.0)
Monocytes Absolute: 0.1 10*3/uL (ref 0.1–1.0)
Monocytes Relative: 9 %
Neutro Abs: 0.9 10*3/uL — ABNORMAL LOW (ref 1.7–7.7)
Neutrophils Relative %: 56 %

## 2022-05-07 LAB — CBC
HCT: 39.4 % (ref 36.0–46.0)
Hemoglobin: 12.8 g/dL (ref 12.0–15.0)
MCH: 33.9 pg (ref 26.0–34.0)
MCHC: 32.5 g/dL (ref 30.0–36.0)
MCV: 104.2 fL — ABNORMAL HIGH (ref 80.0–100.0)
Platelets: 72 10*3/uL — ABNORMAL LOW (ref 150–400)
RBC: 3.78 MIL/uL — ABNORMAL LOW (ref 3.87–5.11)
RDW: 13.1 % (ref 11.5–15.5)
WBC: 1.6 10*3/uL — ABNORMAL LOW (ref 4.0–10.5)
nRBC: 0 % (ref 0.0–0.2)

## 2022-05-07 LAB — RESP PANEL BY RT-PCR (FLU A&B, COVID) ARPGX2
Influenza A by PCR: NEGATIVE
Influenza B by PCR: NEGATIVE
SARS Coronavirus 2 by RT PCR: NEGATIVE

## 2022-05-07 MED ORDER — LACTATED RINGERS IV BOLUS
1000.0000 mL | Freq: Once | INTRAVENOUS | Status: AC
  Administered 2022-05-07: 1000 mL via INTRAVENOUS

## 2022-05-07 NOTE — Discharge Instructions (Addendum)
Recommend you follow-up with your primary physician or infectious disease as needed. A referral for cardiology follow-up has been placed.

## 2022-05-07 NOTE — ED Provider Notes (Incomplete)
[facility]ERGENC[facility] DEPARTMENT Provider Note   CSN: [id number] Arrival date & time: 05/06/22  1700     History {Add pertinent medical, surgical, social history, OB history to HPI:1} Chief Complaint  Patient presents with   Dizziness    Ruth Stephenson is a 71 y.o. female.   Dizziness      Home Medications Prior to Admission medications   Medication Sig Start Date End Date Taking? Authorizing Provider  dolutegravir-lamiVUDine (DOVATO) 50-300 MG tablet Take 1 tablet by mouth daily. 11/25/21   Campbell Riches, MD  furosemide (LASIX) 20 MG tablet Take 1 tablet (20 mg total) by mouth daily. 08/26/21   Campbell Riches, MD  methadone (DOLOPHINE) 10 MG/5ML solution Take 28 mg by mouth daily.    [provider]  METHADONE HCL PO Take 20 mg by mouth.    [provider]      Allergies    Patient has no known allergies.    Review of Systems   Review of Systems  Neurological:  Positive for dizziness.    Physical Exam Updated Vital Signs BP 132/83   Pulse (!) 57   Temp 98.1 F (36.7 C)   Resp 18   Ht '5\' 3"'$  (1.6 m)   Wt 83.9 kg   SpO2 97%   BMI 32.77 kg/m  Physical Exam  ED Results / Procedures / Treatments   Labs (all labs ordered are listed, but only abnormal results are displayed) Labs Reviewed  CBC - Abnormal; Notable for the following components:      Result Value   WBC 1.6 (*)    RBC 3.65 (*)    MCV 104.7 (*)    MCH 34.8 (*)    Platelets 66 (*)    All other components within normal limits  COMPREHENSIVE METABOLIC PANEL - Abnormal; Notable for the following components:   Glucose, Bld 174 (*)    BUN 7 (*)    Albumin 3.1 (*)    All other components within normal limits  URINALYSIS, ROUTINE W REFLEX MICROSCOPIC - Abnormal; Notable for the following components:   APPearance HAZY (*)    Leukocytes,Ua TRACE (*)    Bacteria, UA FEW (*)    All other components within normal limits  BRAIN NATRIURETIC PEPTIDE - Abnormal;  Notable for the following components:   B Natriuretic Peptide 101.0 (*)    All other components within normal limits  TROPONIN I (HIGH SENSITIVITY)  TROPONIN I (HIGH SENSITIVITY)    EKG None  Radiology DG Chest 2 View  Result Date: 05/06/2022 CLINICAL DATA:  Dyspnea, chest pain EXAM: CHEST - 2 VIEW COMPARISON:  None Available. FINDINGS: Lungs are clear. No pneumothorax or pleural effusion. Cardiac size within normal limits. Pulmonary vascularity is normal. No acute bone abnormality. Osseous structures are age-appropriate. IMPRESSION: No active cardiopulmonary disease. Electronically Signed   By: Fidela Salisbury M.D.   On: 05/06/2022 18:59    Procedures Procedures  {Document cardiac monitor, telemetry assessment procedure when appropriate:1}  Medications Ordered in ED Medications - No data to display  ED Course/ Medical Decision Making/ A&P                           Medical Decision Making  ***  {Document critical care time when appropriate:1} {Document review of labs and clinical decision tools ie heart score, Chads2Vasc2 etc:1}  {Document your independent review of radiology images, and any outside records:1} {Document your discussion  with family members, caretakers, and with consultants:1} {Document social determinants of health affecting pt's care:1} {Document your decision making why or why not admission, treatments were needed:1} Final Clinical Impression(s) / ED Diagnoses Final diagnoses:  None    Rx / DC Orders ED Discharge Orders     None

## 2022-05-07 NOTE — ED Notes (Signed)
Discharge instructions reviewed with patient. Patient denies any questions or concerns. Understands need for follow-up. Pt ambulatory out of ED.

## 2022-05-08 LAB — T-HELPER CELLS (CD4) COUNT (NOT AT ARMC)
CD4 % Helper T Cell: 20 % — ABNORMAL LOW (ref 33–65)
CD4 T Cell Abs: 143 /uL — ABNORMAL LOW (ref 400–1790)

## 2022-05-09 LAB — PATHOLOGIST SMEAR REVIEW

## 2022-08-02 ENCOUNTER — Other Ambulatory Visit: Payer: Self-pay

## 2022-08-03 ENCOUNTER — Other Ambulatory Visit: Payer: 59

## 2022-08-16 ENCOUNTER — Encounter: Payer: Medicare Other | Admitting: Infectious Diseases

## 2022-08-23 ENCOUNTER — Encounter: Payer: Medicare Other | Admitting: Infectious Diseases

## 2022-08-27 ENCOUNTER — Other Ambulatory Visit: Payer: Self-pay | Admitting: Infectious Diseases

## 2022-08-27 DIAGNOSIS — R600 Localized edema: Secondary | ICD-10-CM

## 2022-08-30 ENCOUNTER — Encounter: Payer: 59 | Admitting: Infectious Diseases

## 2022-10-03 ENCOUNTER — Encounter: Payer: Self-pay | Admitting: Infectious Diseases

## 2022-10-03 ENCOUNTER — Other Ambulatory Visit (HOSPITAL_COMMUNITY)
Admission: RE | Admit: 2022-10-03 | Discharge: 2022-10-03 | Disposition: A | Discharge status: Home / Self Care | Payer: 59 | Source: Ambulatory Visit | Attending: Infectious Diseases | Admitting: Infectious Diseases

## 2022-10-03 ENCOUNTER — Other Ambulatory Visit: Payer: Self-pay

## 2022-10-03 ENCOUNTER — Ambulatory Visit (INDEPENDENT_AMBULATORY_CARE_PROVIDER_SITE_OTHER): Payer: 59 | Admitting: Infectious Diseases

## 2022-10-03 VITALS — BP 115/65 | HR 56 | Temp 97.6°F | Ht 63.0 in | Wt 208.0 lb

## 2022-10-03 DIAGNOSIS — K769 Liver disease, unspecified: Secondary | ICD-10-CM

## 2022-10-03 DIAGNOSIS — B20 Human immunodeficiency virus [HIV] disease: Secondary | ICD-10-CM | POA: Diagnosis not present

## 2022-10-03 DIAGNOSIS — B182 Chronic viral hepatitis C: Secondary | ICD-10-CM

## 2022-10-03 DIAGNOSIS — Z87891 Personal history of nicotine dependence: Secondary | ICD-10-CM

## 2022-10-03 DIAGNOSIS — Z720 Tobacco use: Secondary | ICD-10-CM

## 2022-10-03 DIAGNOSIS — Z113 Encounter for screening for infections with a predominantly sexual mode of transmission: Secondary | ICD-10-CM | POA: Insufficient documentation

## 2022-10-03 DIAGNOSIS — R0602 Shortness of breath: Secondary | ICD-10-CM

## 2022-10-03 DIAGNOSIS — F1129 Opioid dependence with unspecified opioid-induced disorder: Secondary | ICD-10-CM | POA: Diagnosis not present

## 2022-10-03 DIAGNOSIS — Z79899 Other long term (current) drug therapy: Secondary | ICD-10-CM

## 2022-10-03 NOTE — Progress Notes (Signed)
Subjective:    Patient ID: Ruth Stephenson, female  DOB: 1950-07-27, 72 y.o.        MRN: [id number]   HPI 72yo F with HIV+ since 1989, on genvoya --> Symtuza. (Genotype 2014 naive)--> dovato (11-2021).  She also has a hx of Hep C treated (Harvoni) 2015. 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  In 08-2012 she had surgery for colon cancer (II).  She had CT abd 04-2017: Cirrhotic morphology of the liver. Hyperdense focus within the anterior left hepatic lobe ; when clinically feasible, evaluation with liver MRI is suggested, preferably as an outpatient when the patient is able to breath hold.   She was in hospital 03-14-20 to 03-18-20 for COVID.  She was in hospital 02-2021 with Pneumococcal pna and non-adherence to meds.    She was seen in UC by Dr Darrow Bussing [phone] for CP and L 3rd toe fracture. She was started on pepcid (she did not fill, no further CP). She was referred to CV- has been resched.    She has f/u at Methadone clinic. Down to Methadone 23mg . Does not have cravings, also has help at her church.    Her Hep C RNA was (-) (11-2021).   Got section 8 housing. On disability.   She was seen in urgent care for pre-syncope. Her CV w/u was initially (-) and she deferred admission.   She wants help losing wt- has made it difficult for her to walk. Having foot pain. Feet are dry as are her heels.    HIV 1 RNA Quant  Date Value  12/16/2021 Not Detected Copies/mL  11/25/2021 33 Copies/mL (H)  09/16/2021 105 copies/mL (H)   CD4 T Cell Abs (/uL)  Date Value  05/07/2022 143 (L)  03/10/2021 116 (L)  12/30/2019 221 (L)     Health Maintenance  Topic Date Due   Medicare Annual Wellness (AWV)  Never done   COVID-19 Vaccine (1) Never done   Zoster Vaccines- Shingrix (1 of 2) Never done   MAMMOGRAM  Never done   DEXA SCAN  Never done   COLONOSCOPY (Pts 45-41yrs Insurance coverage will need to be confirmed)  09/21/2022   INFLUENZA VACCINE  01/25/2023    DTaP/Tdap/Td (2 - Td or Tdap) 02/02/2032   Pneumonia Vaccine 94+ Years old  Completed   Hepatitis C Screening  Completed   HPV VACCINES  Aged Out      Review of Systems  Constitutional:  Negative for chills, fever and weight loss.  Respiratory:  Positive for shortness of breath (DOE). Negative for cough.   Cardiovascular:  Negative for chest pain.  Gastrointestinal:  Negative for constipation and diarrhea.  Genitourinary:  Negative for dysuria.  Musculoskeletal:  Positive for joint pain.    Please see HPI. All other systems reviewed and negative.     Objective:  Physical Exam Vitals reviewed.  Constitutional:      Appearance: Normal appearance. She is obese.  HENT:     Mouth/Throat:     Mouth: Mucous membranes are moist.     Pharynx: No oropharyngeal exudate.  Eyes:     Extraocular Movements: Extraocular movements intact.     Pupils: Pupils are equal, round, and reactive to light.  Cardiovascular:     Rate and Rhythm: Normal rate and regular rhythm.  Pulmonary:     Effort: Pulmonary effort is normal.     Breath sounds: Normal breath sounds.  Abdominal:  General: Bowel sounds are normal. There is no distension.     Palpations: Abdomen is soft.     Tenderness: There is no abdominal tenderness.  Musculoskeletal:     Cervical back: Normal range of motion and neck supple.     Right lower leg: Edema present.     Left lower leg: Edema present.  Neurological:     Mental Status: She is alert.  Psychiatric:        Mood and Affect: Mood normal.            Assessment & Plan:

## 2022-10-03 NOTE — Assessment & Plan Note (Signed)
Has quit smoking. Will remove

## 2022-10-03 NOTE — Assessment & Plan Note (Signed)
She continues on her methadone. No cravings

## 2022-10-03 NOTE — Addendum Note (Signed)
Addended by: Yovany Clock C on: 10/03/2022 03:09 PM   Modules accepted: Orders

## 2022-10-03 NOTE — Assessment & Plan Note (Signed)
She is doing well Will recheck her labs Will send for mammo (last?) Will defer on getting her ozempic til she has seen CV Rtc in 2-3 months

## 2022-10-03 NOTE — Assessment & Plan Note (Signed)
Will order MRI liver to f/u her cirrhosis

## 2022-10-03 NOTE — Addendum Note (Signed)
Addended by: Bufford Spikes on: 10/03/2022 03:35 PM   Modules accepted: Orders

## 2022-10-03 NOTE — Assessment & Plan Note (Signed)
Will have her seen by CV

## 2022-10-04 ENCOUNTER — Telehealth: Payer: Self-pay | Admitting: *Deleted

## 2022-10-04 LAB — T-HELPER CELLS (CD4) COUNT (NOT AT ARMC)
CD4 % Helper T Cell: 20 % — ABNORMAL LOW (ref 33–65)
CD4 T Cell Abs: 151 /uL — ABNORMAL LOW (ref 400–1790)

## 2022-10-04 LAB — URINE CYTOLOGY ANCILLARY ONLY
Chlamydia: NEGATIVE
Comment: NEGATIVE
Comment: NORMAL
Neisseria Gonorrhea: NEGATIVE

## 2022-10-04 NOTE — Telephone Encounter (Addendum)
Critical lab value - received from [facility].  WBC - 1.7   Platelets - 86.

## 2022-10-05 LAB — COMPREHENSIVE METABOLIC PANEL
ALT: 11 IU/L (ref 0–32)
AST: 26 IU/L (ref 0–40)
Albumin/Globulin Ratio: 1 — ABNORMAL LOW (ref 1.2–2.2)
Albumin: 3.5 g/dL — ABNORMAL LOW (ref 3.9–4.9)
Alkaline Phosphatase: 88 IU/L (ref 44–121)
BUN/Creatinine Ratio: 14 (ref 12–28)
BUN: 10 mg/dL (ref 8–27)
Bilirubin Total: 0.7 mg/dL (ref 0.0–1.2)
CO2: 24 mmol/L (ref 20–29)
Calcium: 9.1 mg/dL (ref 8.7–10.3)
Chloride: 107 mmol/L — ABNORMAL HIGH (ref 96–106)
Creatinine, Ser: 0.69 mg/dL (ref 0.57–1.00)
Globulin, Total: 3.6 g/dL (ref 1.5–4.5)
Glucose: 83 mg/dL (ref 70–99)
Potassium: 4.1 mmol/L (ref 3.5–5.2)
Sodium: 142 mmol/L (ref 134–144)
Total Protein: 7.1 g/dL (ref 6.0–8.5)
eGFR: 93 mL/min/{1.73_m2} (ref >59)

## 2022-10-05 LAB — CBC
Hematocrit: 39 % (ref 34.0–46.6)
Hemoglobin: 13.3 g/dL (ref 11.1–15.9)
MCH: 33.1 pg — ABNORMAL HIGH (ref 26.6–33.0)
MCHC: 34.1 g/dL (ref 31.5–35.7)
MCV: 97 fL (ref 79–97)
Platelets: 86 10*3/uL — CL (ref 150–450)
RBC: 4.02 x10E6/uL (ref 3.77–5.28)
RDW: 12.6 % (ref 11.7–15.4)
WBC: 1.7 10*3/uL — CL (ref 3.4–10.8)

## 2022-10-05 LAB — LIPID PANEL
Chol/HDL Ratio: 1.9 ratio (ref 0.0–4.4)
Cholesterol, Total: 145 mg/dL (ref 100–199)
HDL: 78 mg/dL (ref >39)
LDL Chol Calc (NIH): 54 mg/dL (ref 0–99)
Triglycerides: 62 mg/dL (ref 0–149)
VLDL Cholesterol Cal: 13 mg/dL (ref 5–40)

## 2022-10-05 LAB — HEMOGLOBIN A1C
Est. average glucose Bld gHb Est-mCnc: 114 mg/dL
Hgb A1c MFr Bld: 5.6 % (ref 4.8–5.6)

## 2022-10-05 LAB — RPR: RPR Ser Ql: NONREACTIVE

## 2022-10-05 LAB — HIV-1 RNA QUANT-NO REFLEX-BLD: HIV-1 RNA Viral Load: <20 copies/mL

## 2022-11-15 ENCOUNTER — Other Ambulatory Visit: Payer: Self-pay

## 2022-11-15 ENCOUNTER — Telehealth: Payer: Self-pay | Admitting: Infectious Diseases

## 2022-11-15 ENCOUNTER — Other Ambulatory Visit: Payer: Self-pay | Admitting: Internal Medicine

## 2022-11-15 ENCOUNTER — Ambulatory Visit (HOSPITAL_COMMUNITY): Admission: RE | Admit: 2022-11-15 | Payer: 59 | Source: Ambulatory Visit

## 2022-11-15 DIAGNOSIS — Z113 Encounter for screening for infections with a predominantly sexual mode of transmission: Secondary | ICD-10-CM

## 2022-11-15 DIAGNOSIS — Z720 Tobacco use: Secondary | ICD-10-CM

## 2022-11-15 DIAGNOSIS — B20 Human immunodeficiency virus [HIV] disease: Secondary | ICD-10-CM

## 2022-11-15 DIAGNOSIS — F1129 Opioid dependence with unspecified opioid-induced disorder: Secondary | ICD-10-CM

## 2022-11-15 DIAGNOSIS — Z79899 Other long term (current) drug therapy: Secondary | ICD-10-CM

## 2022-11-15 DIAGNOSIS — R0602 Shortness of breath: Secondary | ICD-10-CM

## 2022-11-15 DIAGNOSIS — K769 Liver disease, unspecified: Secondary | ICD-10-CM

## 2022-11-15 DIAGNOSIS — B182 Chronic viral hepatitis C: Secondary | ICD-10-CM

## 2022-11-15 NOTE — Telephone Encounter (Signed)
Shanda Bumps called in from [facility]. Requesting co-signature on order for MRI Liver w and w/o contrast. This has been signed by Dr. Heide Spark.

## 2022-11-15 NOTE — Telephone Encounter (Signed)
Good Morning., I have Rec'd a phone call in reference to this patient's MRI order from the [facility] Radiology Department.  The MRI order  had to be changed from  with contrast  to with/without contrast. Radiology is asking for you to sign off on the orders in Epic as this pt is coming in this morning at 11am. Would you mind taking a look at those order that have been change? Thanks!!

## 2022-11-28 ENCOUNTER — Other Ambulatory Visit: Payer: Self-pay | Admitting: Infectious Diseases

## 2022-11-28 DIAGNOSIS — B20 Human immunodeficiency virus [HIV] disease: Secondary | ICD-10-CM

## 2022-11-29 ENCOUNTER — Encounter: Payer: 59 | Admitting: Infectious Diseases

## 2022-12-19 ENCOUNTER — Other Ambulatory Visit: Payer: Self-pay

## 2022-12-19 ENCOUNTER — Ambulatory Visit (INDEPENDENT_AMBULATORY_CARE_PROVIDER_SITE_OTHER): Payer: 59 | Admitting: Allergy & Immunology

## 2022-12-19 ENCOUNTER — Encounter: Payer: Self-pay | Admitting: Allergy & Immunology

## 2022-12-19 VITALS — BP 110/60 | HR 67 | Temp 98.1°F | Ht 62.0 in | Wt 201.6 lb

## 2022-12-19 DIAGNOSIS — B20 Human immunodeficiency virus [HIV] disease: Secondary | ICD-10-CM

## 2022-12-19 DIAGNOSIS — R5383 Other fatigue: Secondary | ICD-10-CM

## 2022-12-19 DIAGNOSIS — Z77120 Contact with and (suspected) exposure to mold (toxic): Secondary | ICD-10-CM

## 2022-12-19 DIAGNOSIS — J3489 Other specified disorders of nose and nasal sinuses: Secondary | ICD-10-CM | POA: Diagnosis not present

## 2022-12-19 NOTE — Progress Notes (Unsigned)
NEW PATIENT  Date of Service/Encounter:  12/19/22  Consult requested by: System, Provider Not In   Assessment:   No diagnosis found.  Plan/Recommendations:    There are no Patient Instructions on file for this visit.   {Blank single:19197::"This note in its entirety was forwarded to the Provider who requested this consultation."}  Subjective:   Ruth Stephenson is a 72 y.o. female presenting today for evaluation of No chief complaint on file.   Ruth Stephenson has a history of the following: Patient Active Problem List   Diagnosis Date Noted   Chest pain 02/01/2022   Depression 09/02/2021   Dysequilibrium 03/24/2021   Elevated LFTs    Thrombocytopenia (HCC) 03/11/2021   Acute respiratory failure with hypoxia (HCC) 03/10/2021   Hyperkalemia 03/10/2021   Other cirrhosis of liver (HCC) 03/10/2021   Obesity (BMI 30-39.9) 07/01/2020   Claudication (HCC) 06/22/2020   Thoracic aortic aneurysm without rupture (HCC) 06/22/2020   Generalized edema 06/03/2020   History of COVID-19 06/03/2020   Opioid dependence (HCC) 03/14/2020   Shortness of breath 03/14/2020   Hernia of abdominal wall 07/10/2019   Cough 09/09/2018   Altered mental status    Polysubstance abuse (HCC)    Overdose 05/04/2017   Bilateral edema of lower extremity 07/29/2014   History of colon cancer, stage II 07/29/2014   Neuropathy 05/27/2014   GERD (gastroesophageal reflux disease) 05/27/2014   Cancer of right colon (HCC) 10/24/2012   History of pancreatitis 09/19/2012   AIDS (acquired immune deficiency syndrome) (HCC) 09/19/2012   Hepatitis C - treated 09/19/2012   Anemia 09/19/2012    History obtained from: chart review and {Persons; PED relatives w/patient:19415::"patient"}.  Ruth Stephenson was referred by System, Provider Not In.     Ruth Stephenson is a 72 y.o. female presenting for {Blank single:19197::"a food challenge","a drug challenge","skin testing","a sick visit","an evaluation of ***","a  follow up visit"}.  She has been in this house for 20+ years. She is renting the place and her landlord does not believe in fixing anything. She tells me that the mold smells so bad. She reports that she does not have the energy thaet she used to have. She thinks that this has lasted for the last year or so. She is afraid that she has developed an allergy to the mold. She has never been allergy tested. She has never had the traditional allergic rhinitis symptoms. She is generally not a sick person. She has had some mild congestion. But this is more overwhelming fatigue and "cloudy" thoughts. She is not sure whether this is more related to anxiety from having mold.  She has noticed some mold in one of the rooms where it was leaking. On the outside where the gutters are supposed to be, they are full of mold.   She has a HIV and is on Truvata. She has been on this for years. She sees Dr. Ninetta Lights. This is one pill once daily.   She had colon cancer. This was 2014.   ***Otherwise, there is no history of other atopic diseases, including {Blank multiple:19196:o:"asthma","food allergies","drug allergies","environmental allergies","stinging insect allergies","eczema","urticaria","contact dermatitis"}. There is no significant infectious history. ***Vaccinations are up to date.    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   Elevated LFTs    Thrombocytopenia (HCC) 03/11/2021   Acute respiratory failure with hypoxia (HCC) 03/10/2021   Hyperkalemia 03/10/2021   Other  cirrhosis of liver (HCC) 03/10/2021   Obesity (BMI 30-39.9) 07/01/2020   Claudication (HCC) 06/22/2020   Thoracic aortic aneurysm without rupture (HCC) 06/22/2020   Generalized edema 06/03/2020   History of COVID-19 06/03/2020   Opioid dependence (HCC) 03/14/2020   Shortness of breath 03/14/2020   Hernia of abdominal wall  07/10/2019   Cough 09/09/2018   Altered mental status    Polysubstance abuse (HCC)    Overdose 05/04/2017   Bilateral edema of lower extremity 07/29/2014   History of colon cancer, stage II 07/29/2014   Neuropathy 05/27/2014   GERD (gastroesophageal reflux disease) 05/27/2014   Cancer of right colon (HCC) 10/24/2012   History of pancreatitis 09/19/2012   AIDS (acquired immune deficiency syndrome) (HCC) 09/19/2012   Hepatitis C - treated 09/19/2012   Anemia 09/19/2012    Medication List:  Allergies as of 12/19/2022   No Known Allergies      Medication List        Accurate as of December 19, 2022  2:13 PM. If you have any questions, ask your nurse or doctor.          Dovato 50-300 MG tablet Generic drug: dolutegravir-lamiVUDine TAKE 1 TABLET BY MOUTH EVERY DAY   furosemide 20 MG tablet Commonly known as: LASIX TAKE 1 TABLET BY MOUTH EVERY DAY   methadone 10 MG/5ML solution Commonly known as: DOLOPHINE Take 28 mg by mouth daily.   METHADONE HCL PO Take 20 mg by mouth.        Birth History: {Blank single:19197::"non-contributory","born premature and spent time in the NICU","born at term without complications"}  Developmental History: Carmencita has met all milestones on time. She has required no {Blank multiple:19196:a:"speech therapy","occupational therapy","physical therapy"}. ***non-contributory  Past Surgical History: Past Surgical History:  Procedure Laterality Date   CARPAL TUNNEL RELEASE Left 01/19/2014   Procedure: LEFT CARPAL TUNNEL RELEASE;  Surgeon: Marlowe Shores, MD;  Location: [facility];  Service: Orthopedics;  Laterality: Left;   COLON RESECTION N/A 09/23/2012   Procedure: LAPAROSCOPIC RIGHT COLON RESECTION   ;  Surgeon: Mariella Saa, MD;  Location: [facility];  Service: General;  Laterality: N/A;  LAPOROSCOPY, RIGHT HEMICOLECTOMY   COLON SURGERY  09/23/2012   lap right colectomy   COLONOSCOPY N/A 09/20/2012   Procedure:  COLONOSCOPY;  Surgeon: Theda Belfast, MD;  Location: [facility];  Service: Endoscopy;  Laterality: N/A;   OPEN REDUCTION INTERNAL FIXATION (ORIF) DISTAL RADIAL FRACTURE Left 01/19/2014   Procedure: OPEN REDUCTION INTERNAL FIXATION (ORIF) LEFT  DISTAL RADIAL FRACTURE;  Surgeon: Marlowe Shores, MD;  Location: [facility];  Service: Orthopedics;  Laterality: Left;     Family History: Family History  Problem Relation Age of Onset   Cancer Mother      Social History: Alveta lives at home with ***.    ROS     Objective:   There were no vitals taken for this visit. There is no height or weight on file to calculate BMI.     Physical Exam   Diagnostic studies: deferred due to insurance stipulations that refuse to pay for testing at initial visits, making it more difficult for patients to get the care they need          Malachi Bonds, MD [facility]

## 2022-12-19 NOTE — Patient Instructions (Signed)
1. Fatigue - We are going to do allergy testing at the next visit. - This will help answer the question. - We will do this at the next visit.   2. Mold exposure - We are going to get  the allergy testing at the next visit. - We can discuss options once we know more details.   3. Nasal septal perforation - We are going to refer you to see ENT.  4. Return in about 1 week (around 12/26/2022) for ALLERGY TESTING.    Please inform us of any Emergency Department visits, hospitalizations, or changes in symptoms. Call us before going to the ED for breathing or allergy symptoms since we might be able to fit you in for a sick visit. Feel free to contact us anytime with any questions, problems, or concerns.  It was a pleasure to meet you today!  Websites that have reliable patient information: 1. American Academy of Asthma, Allergy, and Immunology: www.aaaai.org 2. Food Allergy Research and Education (FARE): foodallergy.org 3. Mothers of Asthmatics: http://www.asthmacommunitynetwork.org 4. American College of Allergy, Asthma, and Immunology: www.acaai.org   COVID-19 Vaccine Information can be found at: PodExchange.nl For questions related to vaccine distribution or appointments, please email [email or web address] or call [phone].   We realize that you might be concerned about having an allergic reaction to the COVID19 vaccines. To help with that concern, WE ARE OFFERING THE COVID19 VACCINES IN OUR OFFICE! Ask the front desk for dates!     "Like" [place] on Facebook and Instagram for our latest updates!      A healthy democracy works best when [employer] participate! Make sure you are registered to vote! If you have moved or changed any of your contact information, you will need to get this updated before voting!  In some cases, you MAY be able to register to vote online:  [email or web address]

## 2022-12-21 ENCOUNTER — Encounter: Payer: Self-pay | Admitting: Allergy & Immunology

## 2022-12-25 NOTE — Patient Instructions (Incomplete)
1. Fatigue - allergy testing today is  2. Mold exposure - allergy testing today is  3. Nasal septal perforation - We are going to refer you to see ENT.  4.

## 2022-12-26 ENCOUNTER — Other Ambulatory Visit: Payer: Self-pay

## 2022-12-26 ENCOUNTER — Ambulatory Visit (INDEPENDENT_AMBULATORY_CARE_PROVIDER_SITE_OTHER): Payer: 59 | Admitting: Family

## 2022-12-26 ENCOUNTER — Encounter: Payer: Self-pay | Admitting: Family

## 2022-12-26 VITALS — BP 102/60 | HR 58 | Temp 98.2°F | Resp 20 | Wt 206.0 lb

## 2022-12-26 DIAGNOSIS — B20 Human immunodeficiency virus [HIV] disease: Secondary | ICD-10-CM

## 2022-12-26 DIAGNOSIS — R5383 Other fatigue: Secondary | ICD-10-CM

## 2022-12-26 DIAGNOSIS — J310 Chronic rhinitis: Secondary | ICD-10-CM | POA: Diagnosis not present

## 2022-12-26 DIAGNOSIS — J3489 Other specified disorders of nose and nasal sinuses: Secondary | ICD-10-CM

## 2022-12-26 DIAGNOSIS — Z77120 Contact with and (suspected) exposure to mold (toxic): Secondary | ICD-10-CM

## 2022-12-26 NOTE — Progress Notes (Signed)
Date of Service/Encounter:  12/26/22  Allergy testing appointment   Initial visit on December 19, 2022 by Dr. Dellis Anes, seen for fatigue, mold exposure, nasal septal perforation secondary to a history of drug abuse, HIV on Truvada with good control (follows with Dr. Ninetta Lights), and history of colon cancer in 2014-now in remission..  Please see that note for additional details.  Today reports for allergy diagnostic testing:    DIAGNOSTICS:  Skin Testing: Environmental allergy panel. Adequate positive and negative controls Results discussed with patient/family.   Airborne Adult Perc - 12/26/22 1525     Time Antigen Placed 1520    Allergen Manufacturer Waynette Buttery    Location Back    Number of Test 55    1. Control-Buffer 50% Glycerol Negative    2. Control-Histamine 3+    3. Bahia Negative    4. French Southern Territories Negative    5. Johnson Negative    6. Kentucky Blue Negative    7. Meadow Fescue Negative    8. Perennial Rye Negative    9. Timothy Negative    10. Ragweed Mix Negative    11. Cocklebur Negative    12. Plantain,  English Negative    13. Baccharis Negative    14. Dog Fennel Negative    15. Russian Thistle Negative    16. Lamb's Quarters Negative    17. Sheep Sorrell Negative    18. Rough Pigweed Negative    19. Marsh Elder, Rough Negative    20. Mugwort, Common Negative    21. Box, Elder Negative    22. Cedar, red Negative    23. Sweet Gum Negative    24. Pecan Pollen Negative    25. Pine Mix Negative    26. Walnut, Black Pollen Negative    27. Red Mulberry Negative    28. Ash Mix Negative    29. Birch Mix Negative    30. Beech American Negative    31. Cottonwood, Guinea-Bissau Negative    32. Hickory, White Negative    33. Maple Mix Negative    34. Oak, Guinea[place] Mix Negative    35. Sycamore Eastern Negative    36. Alternaria Alternata Negative    37. Cladosporium Herbarum Negative    38. Aspergillus Mix Negative    39. Penicillium Mix Negative    40. Bipolaris Sorokiniana  (Helminthosporium) Negative    41. Drechslera Spicifera (Curvularia) Negative    42. Mucor Plumbeus Negative    43. Fusarium Moniliforme Negative    44. Aureobasidium Pullulans (pullulara) Negative    45. Rhizopus Oryzae Negative    46. Botrytis Cinera Negative    47. Epicoccum Nigrum Negative    48. Phoma Betae Negative    49. Dust Mite Mix Negative    50. Cat Hair 10,000 BAU/ml Negative    51.  Dog Epithelia Negative    52. Mixed Feathers Negative    53. Horse Epithelia Negative    54. Cockroach, German Negative    55. Tobacco Leaf Negative             Intradermal - 12/26/22 1542     Time Antigen Placed 1547    Allergen Manufacturer Greer    Location Arm    Number of Test 16    Control Negative    Bahia Negative    French Southern Territories Negative    Johnson Negative    7 Grass Negative    Ragweed Mix Negative    Weed Mix Negative    Tree Mix Negative  Mold 1 Negative    Mold 2 Negative    Mold 3 Negative    Mold 4 Negative    Mite Mix Negative    Cat Negative    Dog Negative    Cockroach Negative             1. Fatigue - allergy testing today is negative with adequate controls - continue to discuss fatigue with your primary care physician  2. Mold exposure - allergy testing today is negative  3. Nasal septal perforation - Referral to ENT sent at last office visit  4.follow up as needed  Allergy testing results were read and interpreted by myself, documented by clinical staff.  Patient provided with copy of allergy testing along with avoidance measures when indicated.   Nehemiah Settle, FNP [facility]

## 2023-01-01 ENCOUNTER — Telehealth: Payer: Self-pay

## 2023-01-01 NOTE — Telephone Encounter (Signed)
Patient is scheduled to see Dr. Cheron Schaumann on 02/21/2023 @ 10:45 AM. Patient is to arrive 20 mins early, bring her photo ID & insurance cards.  I called and left a voicemail for the patient to give me a call back. MyChart message being sent.   Letter mailed to the patients home also.

## 2023-01-01 NOTE — Telephone Encounter (Signed)
-----   Message from Alfonse Spruce, MD sent at 12/19/2022  2:53 PM EDT ----- ENT referral place. I love me some Dr. Marene Lenz.

## 2023-01-03 NOTE — Telephone Encounter (Signed)
Ruth Stephenson!   Malachi Bonds, MD [facility]

## 2023-01-30 DIAGNOSIS — F1120 Opioid dependence, uncomplicated: Secondary | ICD-10-CM | POA: Diagnosis not present

## 2023-02-05 DIAGNOSIS — F1120 Opioid dependence, uncomplicated: Secondary | ICD-10-CM | POA: Diagnosis not present

## 2023-02-12 DIAGNOSIS — F1120 Opioid dependence, uncomplicated: Secondary | ICD-10-CM | POA: Diagnosis not present

## 2023-02-19 DIAGNOSIS — F1120 Opioid dependence, uncomplicated: Secondary | ICD-10-CM | POA: Diagnosis not present

## 2023-02-20 ENCOUNTER — Other Ambulatory Visit: Payer: Self-pay

## 2023-02-20 ENCOUNTER — Emergency Department (HOSPITAL_COMMUNITY)
Admission: EM | Admit: 2023-02-20 | Discharge: 2023-02-20 | Disposition: A | Discharge status: Home / Self Care | Payer: Medicare HMO | Attending: Student | Admitting: Student

## 2023-02-20 ENCOUNTER — Emergency Department (HOSPITAL_BASED_OUTPATIENT_CLINIC_OR_DEPARTMENT_OTHER): Payer: Medicare HMO

## 2023-02-20 ENCOUNTER — Emergency Department (HOSPITAL_COMMUNITY): Payer: Medicare HMO

## 2023-02-20 DIAGNOSIS — R600 Localized edema: Secondary | ICD-10-CM | POA: Insufficient documentation

## 2023-02-20 DIAGNOSIS — Z87891 Personal history of nicotine dependence: Secondary | ICD-10-CM | POA: Diagnosis not present

## 2023-02-20 DIAGNOSIS — N6320 Unspecified lump in the left breast, unspecified quadrant: Secondary | ICD-10-CM | POA: Diagnosis not present

## 2023-02-20 DIAGNOSIS — M25571 Pain in right ankle and joints of right foot: Secondary | ICD-10-CM | POA: Diagnosis not present

## 2023-02-20 DIAGNOSIS — B20 Human immunodeficiency virus [HIV] disease: Secondary | ICD-10-CM | POA: Insufficient documentation

## 2023-02-20 DIAGNOSIS — M79661 Pain in right lower leg: Secondary | ICD-10-CM | POA: Diagnosis not present

## 2023-02-20 DIAGNOSIS — L03115 Cellulitis of right lower limb: Secondary | ICD-10-CM | POA: Diagnosis not present

## 2023-02-20 DIAGNOSIS — Z85038 Personal history of other malignant neoplasm of large intestine: Secondary | ICD-10-CM | POA: Diagnosis not present

## 2023-02-20 DIAGNOSIS — M1711 Unilateral primary osteoarthritis, right knee: Secondary | ICD-10-CM | POA: Diagnosis not present

## 2023-02-20 DIAGNOSIS — M7989 Other specified soft tissue disorders: Secondary | ICD-10-CM | POA: Diagnosis not present

## 2023-02-20 LAB — CBC WITH DIFFERENTIAL/PLATELET
Abs Immature Granulocytes: 0 10*3/uL (ref 0.00–0.07)
Basophils Absolute: 0 10*3/uL (ref 0.0–0.1)
Basophils Relative: 1 %
Eosinophils Absolute: 0.1 10*3/uL (ref 0.0–0.5)
Eosinophils Relative: 3 %
HCT: 36 % (ref 36.0–46.0)
Hemoglobin: 11.9 g/dL — ABNORMAL LOW (ref 12.0–15.0)
Immature Granulocytes: 0 %
Lymphocytes Relative: 45 %
Lymphs Abs: 0.9 10*3/uL (ref 0.7–4.0)
MCH: 33.7 pg (ref 26.0–34.0)
MCHC: 33.1 g/dL (ref 30.0–36.0)
MCV: 102 fL — ABNORMAL HIGH (ref 80.0–100.0)
Monocytes Absolute: 0.3 10*3/uL (ref 0.1–1.0)
Monocytes Relative: 15 %
Neutro Abs: 0.7 10*3/uL — ABNORMAL LOW (ref 1.7–7.7)
Neutrophils Relative %: 36 %
Platelets: 92 10*3/uL — ABNORMAL LOW (ref 150–400)
RBC: 3.53 MIL/uL — ABNORMAL LOW (ref 3.87–5.11)
RDW: 13.2 % (ref 11.5–15.5)
WBC: 1.9 10*3/uL — ABNORMAL LOW (ref 4.0–10.5)
nRBC: 0 % (ref 0.0–0.2)

## 2023-02-20 LAB — COMPREHENSIVE METABOLIC PANEL
ALT: 16 U/L (ref 0–44)
AST: 28 U/L (ref 15–41)
Albumin: 3.3 g/dL — ABNORMAL LOW (ref 3.5–5.0)
Alkaline Phosphatase: 53 U/L (ref 38–126)
Anion gap: 6 (ref 5–15)
BUN: 11 mg/dL (ref 8–23)
CO2: 28 mmol/L (ref 22–32)
Calcium: 9 mg/dL (ref 8.9–10.3)
Chloride: 105 mmol/L (ref 98–111)
Creatinine, Ser: 0.63 mg/dL (ref 0.44–1.00)
GFR, Estimated: >60 mL/min (ref >60)
Glucose, Bld: 91 mg/dL (ref 70–99)
Potassium: 4.1 mmol/L (ref 3.5–5.1)
Sodium: 139 mmol/L (ref 135–145)
Total Bilirubin: 1.5 mg/dL — ABNORMAL HIGH (ref 0.3–1.2)
Total Protein: 7.4 g/dL (ref 6.5–8.1)

## 2023-02-20 LAB — BRAIN NATRIURETIC PEPTIDE: B Natriuretic Peptide: 56 pg/mL (ref 0.0–100.0)

## 2023-02-20 MED ORDER — MORPHINE SULFATE (PF) 4 MG/ML IV SOLN
4.0000 mg | Freq: Once | INTRAVENOUS | Status: AC
  Administered 2023-02-20: 4 mg via INTRAMUSCULAR
  Filled 2023-02-20: qty 1

## 2023-02-20 MED ORDER — CEFADROXIL 500 MG PO CAPS: 500.0000 mg | ORAL_CAPSULE | Freq: Two times a day (BID) | ORAL | 0 refills | Status: DC

## 2023-02-20 MED ORDER — HYDROCODONE-ACETAMINOPHEN 5-325 MG PO TABS
1.0000 | ORAL_TABLET | Freq: Once | ORAL | Status: AC
  Administered 2023-02-20: 1 via ORAL
  Filled 2023-02-20: qty 1

## 2023-02-20 MED ORDER — SODIUM CHLORIDE 0.9 % IV SOLN
2.0000 g | Freq: Once | INTRAVENOUS | Status: AC
  Administered 2023-02-20: 2 g via INTRAVENOUS
  Filled 2023-02-20: qty 20

## 2023-02-20 MED ORDER — DOLUTEGRAVIR-LAMIVUDINE 50-300 MG PO TABS
1.0000 | ORAL_TABLET | Freq: Every day | ORAL | Status: DC
  Administered 2023-02-20: 1 via ORAL
  Filled 2023-02-20: qty 1

## 2023-02-20 NOTE — ED Triage Notes (Signed)
Pt reports right lower leg pain, redness and swelling x few days. 10/10 pain

## 2023-02-21 ENCOUNTER — Other Ambulatory Visit: Payer: Self-pay | Admitting: Infectious Diseases

## 2023-02-21 DIAGNOSIS — N6320 Unspecified lump in the left breast, unspecified quadrant: Secondary | ICD-10-CM

## 2023-02-21 LAB — SEDIMENTATION RATE: Sed Rate: 18 mm/h (ref 0–22)

## 2023-02-21 LAB — PATHOLOGIST SMEAR REVIEW

## 2023-02-21 NOTE — ED Provider Notes (Signed)
[place] EMERGENCY DEPARTMENT AT [facility] Provider Note  CSN: [id number] Arrival date & time: 02/20/23 1650  Chief Complaint(s) Leg Swelling  HPI Ruth Stephenson is a 72 y.o. female who presents emergency department for evaluation of right lower extremity pain, redness and swelling.  Endorses worsening symptoms over the last 2 to 3 days.  States that she was previously on diuretic therapy but does not take this anymore because it caused her some dizziness.  No trauma to the lower extremity and patient is able to walk on the limb but it is limited by significant pain.  Denies associated chest pain, shortness of breath, abdominal pain, nausea, vomiting, fever or other systemic symptoms.  Of note, patient also is concerned about a breast lump on the left.   Past Medical History Past Medical History:  Diagnosis Date   Angio-edema    Cancer (HCC)    Hepatitis C    HIV (human immunodeficiency virus infection) (HCC)    Patient Active Problem List   Diagnosis Date Noted   Chest pain 02/01/2022   Depression 09/02/2021   Dysequilibrium 03/24/2021   Elevated LFTs    Thrombocytopenia (HCC) 03/11/2021   Acute respiratory failure with hypoxia (HCC) 03/10/2021   Hyperkalemia 03/10/2021   Other cirrhosis of liver (HCC) 03/10/2021   Obesity (BMI 30-39.9) 07/01/2020   Claudication (HCC) 06/22/2020   Thoracic aortic aneurysm without rupture (HCC) 06/22/2020   Generalized edema 06/03/2020   History of COVID-19 06/03/2020   Opioid dependence (HCC) 03/14/2020   Shortness of breath 03/14/2020   Hernia of abdominal wall 07/10/2019   Cough 09/09/2018   Altered mental status    Polysubstance abuse (HCC)    Overdose 05/04/2017   Bilateral edema of lower extremity 07/29/2014   History of colon cancer, stage II 07/29/2014   Neuropathy 05/27/2014   GERD (gastroesophageal reflux disease) 05/27/2014   Cancer of right colon (HCC) 10/24/2012   History of pancreatitis 09/19/2012   AIDS  (acquired immune deficiency syndrome) (HCC) 09/19/2012   Hepatitis C - treated 09/19/2012   Anemia 09/19/2012   Home Medication(s) Prior to Admission medications   Medication Sig Start Date End Date Taking? Authorizing Provider  cefadroxil (DURICEF) 500 MG capsule Take 1 capsule (500 mg total) by mouth 2 (two) times daily for 7 days. 02/20/23 02/27/23 Yes Milika Ventress, MD  DOVATO 50-300 MG tablet TAKE 1 TABLET BY MOUTH EVERY DAY 11/29/22  Yes [name], MD  GINKGO BILOBA PLUS PO Take 1 capsule by mouth daily.   Yes [provider]  methadone (DOLOPHINE) 10 MG/5ML solution Take 23 mg by mouth in the morning.   Yes [provider]  furosemide (LASIX) 20 MG tablet TAKE 1 TABLET BY MOUTH EVERY DAY Patient not taking: Reported on 02/20/2023 08/29/22   [name], MD  Past Surgical History Past Surgical History:  Procedure Laterality Date   CARPAL TUNNEL RELEASE Left 01/19/2014   Procedure: LEFT CARPAL TUNNEL RELEASE;  Surgeon: Marlowe Shores, MD;  Location: [facility];  Service: Orthopedics;  Laterality: Left;   COLON RESECTION N/A 09/23/2012   Procedure: LAPAROSCOPIC RIGHT COLON RESECTION   ;  Surgeon: Mariella Saa, MD;  Location: [facility];  Service: General;  Laterality: N/A;  LAPOROSCOPY, RIGHT HEMICOLECTOMY   COLON SURGERY  09/23/2012   lap right colectomy   COLONOSCOPY N/A 09/20/2012   Procedure: COLONOSCOPY;  Surgeon: Theda Belfast, MD;  Location: [facility];  Service: Endoscopy;  Laterality: N/A;   OPEN REDUCTION INTERNAL FIXATION (ORIF) DISTAL RADIAL FRACTURE Left 01/19/2014   Procedure: OPEN REDUCTION INTERNAL FIXATION (ORIF) LEFT  DISTAL RADIAL FRACTURE;  Surgeon: Marlowe Shores, MD;  Location:  SURGERY CENTER;  Service: Orthopedics;  Laterality: Left;   Family History Family History   Problem Relation Age of Onset   Angioedema Mother    Cancer Mother    Asthma Brother    Angioedema Maternal Grandmother     Social History Social History   Tobacco Use   Smoking status: Former    Current packs/day: 0.50    Average packs/day: 0.5 packs/day for 35.0 years (17.5 ttl pk-yrs)    Types: Cigarettes    Passive exposure: Past   Smokeless tobacco: Former  Building services engineer status: Never Used  Substance Use Topics   Alcohol use: Not Currently   Drug use: Not Currently    Types: Cocaine, Marijuana    Comment: Heroin; in outpatient drug proram; pt states she is now just on methadone   Allergies Patient has no known allergies.  Review of Systems Review of Systems  Musculoskeletal:  Positive for arthralgias and myalgias.  Skin:  Positive for rash.    Physical Exam Vital Signs  I have reviewed the triage vital signs BP 112/63 (BP Location: Left Arm)   Pulse (!) 55   Temp 98.5 F (36.9 C) (Oral)   Resp 16   Ht 5\' 2"  (1.575 m)   Wt 94 kg   SpO2 92%   BMI 37.90 kg/m   Physical Exam Vitals and nursing note reviewed.  Constitutional:      General: She is not in acute distress.    Appearance: She is well-developed.  HENT:     Head: Normocephalic and atraumatic.  Eyes:     Conjunctiva/sclera: Conjunctivae normal.  Cardiovascular:     Rate and Rhythm: Normal rate and regular rhythm.     Heart sounds: No murmur heard. Pulmonary:     Effort: Pulmonary effort is normal. No respiratory distress.     Breath sounds: Normal breath sounds.  Abdominal:     Palpations: Abdomen is soft.     Tenderness: There is no abdominal tenderness.  Musculoskeletal:        General: Tenderness present. No swelling.     Cervical back: Neck supple.  Skin:    General: Skin is warm and dry.     Capillary Refill: Capillary refill takes less than 2 seconds.     Findings: Erythema and rash present.  Neurological:     Mental Status: She is alert.  Psychiatric:        Mood and  Affect: Mood normal.     ED Results and Treatments Labs (all labs ordered are listed, but only abnormal results are displayed) Labs Reviewed  COMPREHENSIVE METABOLIC PANEL -  Abnormal; Notable for the following components:      Result Value   Albumin 3.3 (*)    Total Bilirubin 1.5 (*)    All other components within normal limits  CBC WITH DIFFERENTIAL/PLATELET - Abnormal; Notable for the following components:   WBC 1.9 (*)    RBC 3.53 (*)    Hemoglobin 11.9 (*)    MCV 102.0 (*)    Platelets 92 (*)    Neutro Abs 0.7 (*)    All other components within normal limits  BRAIN NATRIURETIC PEPTIDE  SEDIMENTATION RATE  CBC WITH DIFFERENTIAL/PLATELET  C-REACTIVE PROTEIN  CBC WITH DIFFERENTIAL/PLATELET  PATHOLOGIST SMEAR REVIEW                                                                                                                          Radiology VAS [place] LOWER EXTREMITY VENOUS (DVT) (ONLY [facility] & [facility])  Result Date: 02/20/2023  Lower Venous DVT Study Patient Name:  Ruth Stephenson  Date of Exam:   02/20/2023 Medical Rec #: [id number]        Accession #:    [id number] Date of Birth: 15-Jan-1951        Patient Gender: F Patient Age:   89 years Exam Location:  [facility] Procedure:      VAS [place] LOWER EXTREMITY VENOUS (DVT) Referring Phys: [place] --------------------------------------------------------------------------------  Indications: Pain, Swelling, and Erythema.  Limitations: Edema of calf. Comparison Study: No prior study on file Performing Technologist: Sherren Kerns RVS  Examination Guidelines: A complete evaluation includes B-mode imaging, spectral Doppler, color Doppler, and power Doppler as needed of all accessible portions of each vessel. Bilateral testing is considered an integral part of a complete examination. Limited examinations for reoccurring indications may be performed as noted. The reflux portion of the exam is performed with the patient in reverse  Trendelenburg.  +---------+---------------+---------+-----------+----------+-------------------+ RIGHT    CompressibilityPhasicitySpontaneityPropertiesThrombus Aging      +---------+---------------+---------+-----------+----------+-------------------+ CFV      Full           Yes      Yes                                      +---------+---------------+---------+-----------+----------+-------------------+ SFJ      Full                                                             +---------+---------------+---------+-----------+----------+-------------------+ FV Prox  Full                                                             +---------+---------------+---------+-----------+----------+-------------------+  FV Mid   Full                                                             +---------+---------------+---------+-----------+----------+-------------------+ FV DistalFull                                                             +---------+---------------+---------+-----------+----------+-------------------+ PFV      Full                                                             +---------+---------------+---------+-----------+----------+-------------------+ POP      Full           Yes      Yes                                      +---------+---------------+---------+-----------+----------+-------------------+ PTV                                                   patent by color     +---------+---------------+---------+-----------+----------+-------------------+ PERO                                                  Not well visualized +---------+---------------+---------+-----------+----------+-------------------+   +----+---------------+---------+-----------+----------+--------------+ LEFTCompressibilityPhasicitySpontaneityPropertiesThrombus Aging +----+---------------+---------+-----------+----------+--------------+ CFV Full            Yes      Yes                                 +----+---------------+---------+-----------+----------+--------------+    Summary: RIGHT: - There is no evidence of deep vein thrombosis in the lower extremity.  - No cystic structure found in the popliteal fossa. - Ultrasound characteristics of enlarged lymph nodes are noted in the groin.  LEFT: - No evidence of common femoral vein obstruction.   *See table(s) above for measurements and observations.    Preliminary    DG Tibia/Fibula Right  Result Date: 02/20/2023 CLINICAL DATA:  Right lower leg pain, redness, and swelling for a few days. EXAM: RIGHT TIBIA AND FIBULA - 2 VIEW COMPARISON:  Right foot 712 21 FINDINGS: Degenerative changes in the right knee with medial compartment narrowing and small osteophyte formation. No evidence of acute fracture or dislocation in the tibia and fibula. No focal bone lesion or bone destruction. Scattered soft tissue calcifications likely representing phleboliths. No radiopaque soft tissue foreign bodies or soft tissue gas. IMPRESSION: Degenerative changes in the right knee. No acute bony abnormalities. Electronically Signed   By: Chrissie Noa  Andria Meuse M.D.   On: 02/20/2023 19:02    Pertinent labs & imaging results that were available during my care of the patient were reviewed by me and considered in my medical decision making (see MDM for details).  Medications Ordered in ED Medications  dolutegravir-lamiVUDine (DOVATO) 50-300 MG per tablet 1 tablet (1 tablet Oral Given 02/20/23 2329)  morphine (PF) 4 MG/ML injection 4 mg (4 mg Intramuscular Given 02/20/23 1855)  cefTRIAXone (ROCEPHIN) 2 g in sodium chloride 0.9 % 100 mL IVPB (0 g Intravenous Stopped 02/20/23 2315)  HYDROcodone-acetaminophen (NORCO/VICODIN) 5-325 MG per tablet 1 tablet (1 tablet Oral Given 02/20/23 2242)                                                                                                                                      Procedures Procedures  (including critical care time)  Medical Decision Making / ED Course   This patient presents to the ED for concern of leg pain, rash, this involves an extensive number of treatment options, and is a complaint that carries with it a high risk of complications and morbidity.  The differential diagnosis includes cellulitis, DVT, thrombophlebitis, necrotizing fasciitis, stasis dermatitis  MDM: Patient seen emergency room for evaluation of lower extremity pain.  Physical exam with erythema over the ankle and shin but does not cross the joint line there near the joint.  She has full range of motion of the ankle and knee.  There is also a small palpable lump in the left breast that is nontender to palpation.  Laboratory evaluation with a leukopenia to 1.9 which appears to be chronic for this patient, hemoglobin 11, albumin 3.3.  X-ray with no soft tissue gas or fracture.  DVT ultrasound reassuringly negative.  Patient presentation consistent with cellulitis and patient started on ceftriaxone.  She currently does not meet inpatient criteria for admission and she will be discharged with [facility] for antibiotic coverage.  Return precautions given which she voiced understanding.  I also placed a referral to outpatient breast clinic for follow-up of the breast lump.   Additional history obtained:  -External records from outside source obtained and reviewed including: Chart review including previous notes, labs, imaging, consultation notes   Lab Tests: -I ordered, reviewed, and interpreted labs.   The pertinent results include:   Labs Reviewed  COMPREHENSIVE METABOLIC PANEL - Abnormal; Notable for the following components:      Result Value   Albumin 3.3 (*)    Total Bilirubin 1.5 (*)    All other components within normal limits  CBC WITH DIFFERENTIAL/PLATELET - Abnormal; Notable for the following components:   WBC 1.9 (*)    RBC 3.53 (*)    Hemoglobin 11.9 (*)    MCV 102.0  (*)    Platelets 92 (*)    Neutro Abs 0.7 (*)    All other components within normal limits  BRAIN NATRIURETIC PEPTIDE  SEDIMENTATION RATE  CBC WITH DIFFERENTIAL/PLATELET  C-REACTIVE PROTEIN  CBC WITH DIFFERENTIAL/PLATELET  PATHOLOGIST SMEAR REVIEW       Imaging Studies ordered: I ordered imaging studies including x-ray tib-fib, DVT ultrasound I independently visualized and interpreted imaging. I agree with the radiologist interpretation   Medicines ordered and prescription drug management: Meds ordered this encounter  Medications   dolutegravir-lamiVUDine (DOVATO) 50-300 MG per tablet 1 tablet   morphine (PF) 4 MG/ML injection 4 mg   cefTRIAXone (ROCEPHIN) 2 g in sodium chloride 0.9 % 100 mL IVPB    Order Specific Question:   Antibiotic Indication:    Answer:   Cellulitis   HYDROcodone-acetaminophen (NORCO/VICODIN) 5-325 MG per tablet 1 tablet   cefadroxil (DURICEF) 500 MG capsule    Sig: Take 1 capsule (500 mg total) by mouth 2 (two) times daily for 7 days.    Dispense:  14 capsule    Refill:  0    -I have reviewed the patients home medicines and have made adjustments as needed  Critical interventions none    Cardiac Monitoring: The patient was maintained on a cardiac monitor.  I personally viewed and interpreted the cardiac monitored which showed an underlying rhythm of: NSR  Social Determinants of Health:  Factors impacting patients care include: none   Reevaluation: After the interventions noted above, I reevaluated the patient and found that they have :improved  Co morbidities that complicate the patient evaluation  Past Medical History:  Diagnosis Date   Angio-edema    Cancer (HCC)    Hepatitis C    HIV (human immunodeficiency virus infection) (HCC)       Dispostion: I considered admission for this patient, but at this time she does not meet inpatient criteria for admission and she is safe for discharge with outpatient follow-up     Final  Clinical Impression(s) / ED Diagnoses Final diagnoses:  Cellulitis of right lower extremity  Mass of left breast, unspecified quadrant     @PCDICTATION @    Clarke Peretz, Wyn Forster, MD 02/21/23 [phone]

## 2023-02-26 ENCOUNTER — Inpatient Hospital Stay (HOSPITAL_COMMUNITY)
Admission: EM | Admit: 2023-02-26 | Discharge: 2023-03-01 | DRG: 603 | Disposition: A | Discharge status: Home / Self Care | Payer: Medicare HMO | Attending: Internal Medicine | Admitting: Internal Medicine

## 2023-02-26 ENCOUNTER — Other Ambulatory Visit: Payer: Self-pay

## 2023-02-26 ENCOUNTER — Encounter (HOSPITAL_COMMUNITY): Payer: Self-pay | Admitting: Radiology

## 2023-02-26 DIAGNOSIS — Z825 Family history of asthma and other chronic lower respiratory diseases: Secondary | ICD-10-CM

## 2023-02-26 DIAGNOSIS — Z8616 Personal history of COVID-19: Secondary | ICD-10-CM | POA: Diagnosis not present

## 2023-02-26 DIAGNOSIS — Z87891 Personal history of nicotine dependence: Secondary | ICD-10-CM

## 2023-02-26 DIAGNOSIS — F1120 Opioid dependence, uncomplicated: Secondary | ICD-10-CM | POA: Diagnosis present

## 2023-02-26 DIAGNOSIS — Z85038 Personal history of other malignant neoplasm of large intestine: Secondary | ICD-10-CM | POA: Diagnosis not present

## 2023-02-26 DIAGNOSIS — F1129 Opioid dependence with unspecified opioid-induced disorder: Secondary | ICD-10-CM

## 2023-02-26 DIAGNOSIS — Z79899 Other long term (current) drug therapy: Secondary | ICD-10-CM

## 2023-02-26 DIAGNOSIS — R9431 Abnormal electrocardiogram [ECG] [EKG]: Secondary | ICD-10-CM | POA: Diagnosis not present

## 2023-02-26 DIAGNOSIS — L03115 Cellulitis of right lower limb: Principal | ICD-10-CM | POA: Diagnosis present

## 2023-02-26 DIAGNOSIS — B20 Human immunodeficiency virus [HIV] disease: Secondary | ICD-10-CM | POA: Diagnosis present

## 2023-02-26 DIAGNOSIS — I878 Other specified disorders of veins: Secondary | ICD-10-CM | POA: Diagnosis present

## 2023-02-26 DIAGNOSIS — Z66 Do not resuscitate: Secondary | ICD-10-CM

## 2023-02-26 DIAGNOSIS — R17 Unspecified jaundice: Secondary | ICD-10-CM | POA: Diagnosis not present

## 2023-02-26 DIAGNOSIS — E8809 Other disorders of plasma-protein metabolism, not elsewhere classified: Secondary | ICD-10-CM | POA: Diagnosis present

## 2023-02-26 DIAGNOSIS — D696 Thrombocytopenia, unspecified: Secondary | ICD-10-CM | POA: Diagnosis not present

## 2023-02-26 DIAGNOSIS — K5900 Constipation, unspecified: Secondary | ICD-10-CM | POA: Diagnosis present

## 2023-02-26 DIAGNOSIS — D539 Nutritional anemia, unspecified: Secondary | ICD-10-CM | POA: Diagnosis present

## 2023-02-26 DIAGNOSIS — R609 Edema, unspecified: Secondary | ICD-10-CM | POA: Diagnosis present

## 2023-02-26 DIAGNOSIS — E669 Obesity, unspecified: Secondary | ICD-10-CM | POA: Diagnosis not present

## 2023-02-26 DIAGNOSIS — Z6835 Body mass index (BMI) 35.0-35.9, adult: Secondary | ICD-10-CM

## 2023-02-26 DIAGNOSIS — M7989 Other specified soft tissue disorders: Secondary | ICD-10-CM | POA: Diagnosis not present

## 2023-02-26 HISTORY — DX: Other psychoactive substance abuse, uncomplicated: F19.10

## 2023-02-26 LAB — BASIC METABOLIC PANEL
Anion gap: 8 (ref 5–15)
BUN: 10 mg/dL (ref 8–23)
CO2: 25 mmol/L (ref 22–32)
Calcium: 8.7 mg/dL — ABNORMAL LOW (ref 8.9–10.3)
Chloride: 104 mmol/L (ref 98–111)
Creatinine, Ser: 0.7 mg/dL (ref 0.44–1.00)
GFR, Estimated: >60 mL/min (ref >60)
Glucose, Bld: 123 mg/dL — ABNORMAL HIGH (ref 70–99)
Potassium: 5.1 mmol/L (ref 3.5–5.1)
Sodium: 137 mmol/L (ref 135–145)

## 2023-02-26 LAB — CBC WITH DIFFERENTIAL/PLATELET
Abs Immature Granulocytes: 0.02 10*3/uL (ref 0.00–0.07)
Basophils Absolute: 0 10*3/uL (ref 0.0–0.1)
Basophils Relative: 0 %
Eosinophils Absolute: 0 10*3/uL (ref 0.0–0.5)
Eosinophils Relative: 0 %
HCT: 42.8 % (ref 36.0–46.0)
Hemoglobin: 14.1 g/dL (ref 12.0–15.0)
Immature Granulocytes: 0 %
Lymphocytes Relative: 21 %
Lymphs Abs: 1.1 10*3/uL (ref 0.7–4.0)
MCH: 33.5 pg (ref 26.0–34.0)
MCHC: 32.9 g/dL (ref 30.0–36.0)
MCV: 101.7 fL — ABNORMAL HIGH (ref 80.0–100.0)
Monocytes Absolute: 0.4 10*3/uL (ref 0.1–1.0)
Monocytes Relative: 9 %
Neutro Abs: 3.5 10*3/uL (ref 1.7–7.7)
Neutrophils Relative %: 70 %
Platelets: 108 10*3/uL — ABNORMAL LOW (ref 150–400)
RBC: 4.21 MIL/uL (ref 3.87–5.11)
RDW: 13.2 % (ref 11.5–15.5)
WBC: 5 10*3/uL (ref 4.0–10.5)
nRBC: 0 % (ref 0.0–0.2)

## 2023-02-26 MED ORDER — OXYCODONE HCL 5 MG PO TABS
10.0000 mg | ORAL_TABLET | ORAL | Status: DC | PRN
  Administered 2023-02-26 – 2023-03-01 (×7): 10 mg via ORAL
  Filled 2023-02-26 (×8): qty 2

## 2023-02-26 MED ORDER — VANCOMYCIN HCL IN DEXTROSE 1-5 GM/200ML-% IV SOLN
1000.0000 mg | INTRAVENOUS | Status: DC
  Administered 2023-02-27 – 2023-02-28 (×2): 1000 mg via INTRAVENOUS
  Filled 2023-02-26 (×2): qty 200

## 2023-02-26 MED ORDER — BISACODYL 5 MG PO TBEC
10.0000 mg | DELAYED_RELEASE_TABLET | Freq: Every day | ORAL | Status: DC
  Administered 2023-02-27 – 2023-03-01 (×3): 10 mg via ORAL
  Filled 2023-02-26 (×3): qty 2

## 2023-02-26 MED ORDER — ONDANSETRON HCL 4 MG PO TABS: 4.0000 mg | ORAL_TABLET | Freq: Four times a day (QID) | ORAL | Status: DC | PRN

## 2023-02-26 MED ORDER — ONDANSETRON HCL 4 MG/2ML IJ SOLN: 4.0000 mg | Freq: Four times a day (QID) | INTRAMUSCULAR | Status: DC | PRN

## 2023-02-26 MED ORDER — SODIUM CHLORIDE 0.9 % IV SOLN
2.0000 g | Freq: Once | INTRAVENOUS | Status: AC
  Administered 2023-02-26: 2 g via INTRAVENOUS
  Filled 2023-02-26: qty 12.5

## 2023-02-26 MED ORDER — POLYETHYLENE GLYCOL 3350 17 G PO PACK
34.0000 g | PACK | Freq: Every day | ORAL | Status: AC
  Administered 2023-02-26: 34 g via ORAL
  Filled 2023-02-26 (×3): qty 2

## 2023-02-26 MED ORDER — ACETAMINOPHEN 650 MG RE SUPP: 650.0000 mg | Freq: Four times a day (QID) | RECTAL | Status: DC | PRN

## 2023-02-26 MED ORDER — ACETAMINOPHEN 325 MG PO TABS: 650.0000 mg | ORAL_TABLET | Freq: Four times a day (QID) | ORAL | Status: DC | PRN

## 2023-02-26 MED ORDER — MORPHINE SULFATE (PF) 4 MG/ML IV SOLN
4.0000 mg | Freq: Once | INTRAVENOUS | Status: AC
  Administered 2023-02-26: 4 mg via INTRAVENOUS
  Filled 2023-02-26: qty 1

## 2023-02-26 MED ORDER — DOLUTEGRAVIR-LAMIVUDINE 50-300 MG PO TABS
1.0000 | ORAL_TABLET | Freq: Every day | ORAL | Status: DC
  Administered 2023-02-27 – 2023-03-01 (×3): 1 via ORAL
  Filled 2023-02-26 (×3): qty 1

## 2023-02-26 MED ORDER — SODIUM CHLORIDE 0.9 % IV SOLN
1.0000 g | INTRAVENOUS | Status: DC
  Administered 2023-02-27 – 2023-03-01 (×3): 1 g via INTRAVENOUS
  Filled 2023-02-26 (×3): qty 10

## 2023-02-26 MED ORDER — VANCOMYCIN HCL IN DEXTROSE 1-5 GM/200ML-% IV SOLN
1000.0000 mg | Freq: Once | INTRAVENOUS | Status: AC
  Administered 2023-02-26: 1000 mg via INTRAVENOUS
  Filled 2023-02-26: qty 200

## 2023-02-26 MED ORDER — METHADONE HCL 10 MG/ML PO CONC
23.0000 mg | Freq: Every day | ORAL | Status: DC
  Filled 2023-02-26 (×2): qty 5

## 2023-02-26 MED ORDER — MELATONIN 5 MG PO TABS
10.0000 mg | ORAL_TABLET | Freq: Every evening | ORAL | Status: DC | PRN
  Administered 2023-02-26 – 2023-02-28 (×3): 10 mg via ORAL
  Filled 2023-02-26 (×3): qty 2

## 2023-02-26 NOTE — H&P (Signed)
History and Physical    Ruth Stephenson VHQ:[id number] DOB: 1951-01-01 DOA: 02/26/2023  DOS: the patient was seen and examined on 02/26/2023  PCP: Ruth Smart, MD   Patient coming from: Home  I have personally briefly reviewed patient's old medical records in [place] [facility]  CC: right leg erythema HPI: 72 year old African-American female history of AIDS on HAART therapy, opioid dependence on chronic methadone 23 mg a day, obesity BMI of 32.7, chronic thrombocytopenia, chronic leukopenia, history of colon cancer status post resection presents to the ER today with a 2-week history of redness to the right leg.  She states that she may have gotten bit by something while she was walking in the yard.  She denies walking around in bare feet.  She was seen in the ER on 02/20/2023.  She was given a prescription for Duricef.  She has been taking this for the last 5 days.  She has not noted any improvement.  The right leg is continued to be erythematous and warm.  Is not draining anything.  She has some edema.  Continues to be painful.  Patient also complains of constipation.  No fever or chills.  Due to continued pain and erythema, patient presented back to the ER for evaluation.  On arrival temp 99.7 heart rate 73 blood pressure 110/72 satting 100% on room air.  Weight 83.9 kg.  BMI 32.7  White count 5.0, hemoglobin 14.1, platelets of 108  Sodium 137, potassium 5.1, chloride 104, bicarb 25, BUN of 10, creatinine 0.7, glucose 123  [facility] hospital[facility] consulted.   ED Course: WBC 5, HgB 14.1, plt 108. Started on IV cefepime and vanco.  Review of Systems:  Review of Systems  Constitutional:  Negative for chills and fever.  HENT: Negative.    Eyes: Negative.   Respiratory: Negative.  Negative for shortness of breath.   Cardiovascular: Negative.  Negative for chest pain.  Gastrointestinal: Negative.  Negative for heartburn and nausea.  Genitourinary: Negative.   Musculoskeletal:  Negative.   Skin:        Increase in pain, swelling, erythema to right leg despite 5 days of po abx.  Neurological: Negative.   Endo/Heme/Allergies: Negative.   Psychiatric/Behavioral: Negative.    All other systems reviewed and are negative.   Past Medical History:  Diagnosis Date   Angio-edema    Cancer (HCC)    Cancer of right colon (HCC) 10/24/2012   Discussed with h/o- no need for further f/u (08-2020)     Hepatitis C    History of COVID-19 06/03/2020   HIV (human immunodeficiency virus infection) (HCC)    Polysubstance abuse (HCC)     Past Surgical History:  Procedure Laterality Date   CARPAL TUNNEL RELEASE Left 01/19/2014   Procedure: LEFT CARPAL TUNNEL RELEASE;  Surgeon: Marlowe Shores, MD;  Location: [facility];  Service: Orthopedics;  Laterality: Left;   COLON RESECTION N/A 09/23/2012   Procedure: LAPAROSCOPIC RIGHT COLON RESECTION   ;  Surgeon: Mariella Saa, MD;  Location: [facility];  Service: General;  Laterality: N/A;  LAPOROSCOPY, RIGHT HEMICOLECTOMY   COLON SURGERY  09/23/2012   lap right colectomy   COLONOSCOPY N/A 09/20/2012   Procedure: COLONOSCOPY;  Surgeon: Theda Belfast, MD;  Location: [facility];  Service: Endoscopy;  Laterality: N/A;   OPEN REDUCTION INTERNAL FIXATION (ORIF) DISTAL RADIAL FRACTURE Left 01/19/2014   Procedure: OPEN REDUCTION INTERNAL FIXATION (ORIF) LEFT  DISTAL RADIAL FRACTURE;  Surgeon: Marlowe Shores, MD;  Location: [facility];  Service: Orthopedics;  Laterality: Left;     reports that she has quit smoking. Her smoking use included cigarettes. She has a 17.5 pack-year smoking history. She has been exposed to tobacco smoke. She has quit using smokeless tobacco. She reports that she does not currently use alcohol. She reports that she does not currently use drugs.  No Known Allergies  Family History  Problem Relation Age of Onset   Angioedema Mother    Cancer Mother    Asthma Brother     Angioedema Maternal Grandmother     Prior to Admission medications   Medication Sig Start Date End Date Taking? Authorizing Provider  cefadroxil (DURICEF) 500 MG capsule Take 1 capsule (500 mg total) by mouth 2 (two) times daily for 7 days. 02/20/23 02/27/23 Yes Kommor, Madison, MD  DOVATO 50-300 MG tablet TAKE 1 TABLET BY MOUTH EVERY DAY 11/29/22  Yes Ruth Smart, MD  GINKGO BILOBA PLUS PO Take 1 capsule by mouth daily.   Yes [provider]  methadone (DOLOPHINE) 10 MG/5ML solution Take 23 mg by mouth in the morning.   Yes [provider]    Physical Exam: Vitals:   02/26/23 2030 02/26/23 2045 02/26/23 2115 02/26/23 2130  BP: 117/76 (!) 124/96 131/85 (!) 145/72  Pulse: 65 64 77 63  Resp:      Temp:      TempSrc:      SpO2: 95% 97% 91% 95%  Weight:      Height:        Physical Exam Vitals and nursing note reviewed.  Constitutional:      General: She is not in acute distress.    Appearance: She is obese. She is not toxic-appearing or diaphoretic.  HENT:     Head: Normocephalic and atraumatic.     Nose: Nose normal.  Eyes:     General: No scleral icterus. Cardiovascular:     Rate and Rhythm: Normal rate and regular rhythm.     Pulses: Normal pulses.     Heart sounds: Murmur heard.     Systolic murmur is present with a grade of 2/6.     Comments: SEM LUSB Pulmonary:     Effort: Pulmonary effort is normal. No respiratory distress.     Breath sounds: No wheezing.  Abdominal:     General: Bowel sounds are normal. There is no distension.     Palpations: Abdomen is soft.     Tenderness: There is no abdominal tenderness. There is no guarding.  Musculoskeletal:     Right lower leg: Edema present.     Comments: No pedal edema  Skin:    General: Skin is warm and dry.     Capillary Refill: Capillary refill takes less than 2 seconds.     Findings: Erythema present. No lesion.     Comments: Hyperpigmentation to bilateral lower legs. About 1 1/2 hand breadth  right ankles and 2 hand breadths on the left leg. See picture  Right LE erythematous. +1 pitting right LE edema. Cellulitis is blanchable. No open sores or lesions. No drainage.  Neurological:     General: No focal deficit present.     Mental Status: She is alert and oriented to person, place, and time.      Labs on Admission: I have personally reviewed following labs and imaging studies  CBC: Recent Labs  Lab 02/20/23 2200 02/26/23 2045  WBC 1.9* 5.0  NEUTROABS 0.7* 3.5  HGB 11.9*  14.1  HCT 36.0 42.8  MCV 102.0* 101.7*  PLT 92* 108*   Basic Metabolic Panel: Recent Labs  Lab 02/20/23 1852 02/26/23 1804  NA 139 137  K 4.1 5.1  CL 105 104  CO2 28 25  GLUCOSE 91 123*  BUN 11 10  CREATININE 0.63 0.70  CALCIUM 9.0 8.7*   GFR: Estimated Creatinine Clearance: 67.1 mL/min (by C-G formula based on SCr of 0.7 mg/dL). Liver Function Tests: Recent Labs  Lab 02/20/23 1852  AST 28  ALT 16  ALKPHOS 53  BILITOT 1.5*  PROT 7.4  ALBUMIN 3.3*   BNP (last 3 results) Recent Labs    05/06/22 1750 02/20/23 2200  BNP 101.0* 56.0   Urine analysis:    Component Value Date/Time   COLORURINE YELLOW 05/06/2022 1919   APPEARANCEUR HAZY (A) 05/06/2022 1919   LABSPEC 1.014 05/06/2022 1919   PHURINE 6.0 05/06/2022 1919   GLUCOSEU NEGATIVE 05/06/2022 1919   HGBUR NEGATIVE 05/06/2022 1919   BILIRUBINUR NEGATIVE 05/06/2022 1919   KETONESUR NEGATIVE 05/06/2022 1919   PROTEINUR NEGATIVE 05/06/2022 1919   UROBILINOGEN 1.0 03/25/2015 2034   NITRITE NEGATIVE 05/06/2022 1919   LEUKOCYTESUR TRACE (A) 05/06/2022 1919    Radiological Exams on Admission: I have personally reviewed images No results found.  EKG: My personal interpretation of EKG shows: NSR   Assessment/Plan Principal Problem:   Cellulitis of right leg Active Problems:   AIDS (acquired immune deficiency syndrome) (HCC)   Opioid dependence (HCC)   Obesity (BMI 30-39.9)   DNR (do not resuscitate)/DNI(Do Not  Intubate)   Thrombocytopenia (HCC)   Assessment and Plan: * Cellulitis of right leg Admit to med/surg bed. Pt has been on 5 days of Duricef(1st generation cephalosporin) without any improvement in her cellulitis. Change to IV rocephin/vanco. Check MRSA screen. Check right LE U/S to r/o DVT. Prn oxycodone 10 mg q4h prn pain.    DNR (do not resuscitate)/DNI(Do Not Intubate) Verified with pt that she wants to be DNI/DNI in event of cardiopulmonary arrest.  Obesity (BMI 30-39.9) Chronic. BMI 32.7  Opioid dependence (HCC) Pt on chronic methadone 23 mg per day. Pt stable and receives 27 take home doses every 4 weeks. Verified by pharmacy 02-20-2023. Continue with methadone. Pt may require higher doses of oxycodone for pain control due to hx of opioid tolerance  AIDS (acquired immune deficiency syndrome) (HCC) Chronic. Continue Dovato. Follows with Dr. Ninetta Lights in ID clinic.  Thrombocytopenia (HCC) Chronic. Stable.   DVT prophylaxis: SCDs Code Status: DNR/DNI(Do NOT Intubate). Verified with pt at bedside Family Communication:  no family at bedside Disposition Plan: return home  Consults called: none  Admission status: Inpatient, Med-Surg   Carollee Herter, DO [facility] 02/26/2023, 10:24 PM

## 2023-02-26 NOTE — Assessment & Plan Note (Addendum)
Chronic. Continue Dovato. Follows with Dr. Ninetta Lights in ID clinic.

## 2023-02-26 NOTE — Assessment & Plan Note (Signed)
Verified with pt that she wants to be DNI/DNI in event of cardiopulmonary arrest.

## 2023-02-26 NOTE — Assessment & Plan Note (Signed)
Chronic. BMI 32.7

## 2023-02-26 NOTE — Progress Notes (Signed)
Pharmacy Antibiotic Note  Ruth Stephenson is a 72 y.o. female admitted on 02/26/2023 with history of AIDS on HAART therapy, opioid dependence on chronic methadone 23 mg a day, obesity BMI of 32.7, chronic thrombocytopenia, chronic leukopenia, history of colon cancer status post resection presents to the ER today with a 2-week history of redness to the right leg. Marland Kitchen  Pharmacy has been consulted to dose vancomycin for cellulitis.  1st dose given in the ED  Plan: Vancomycin 1gm IV q24h (AUC 483.3, Scr 0.8) Follow renal function and clinical course  Height: 5\' 3"  (160 cm) Weight: 83.9 kg (185 lb) IBW/kg (Calculated) : 52.4  Temp (24hrs), Avg:98.9 F (37.2 C), Min:98.1 F (36.7 C), Max:99.7 F (37.6 C)  Recent Labs  Lab 02/20/23 1852 02/20/23 2200 02/26/23 1804 02/26/23 2045  WBC  --  1.9*  --  5.0  CREATININE 0.63  --  0.70  --     Estimated Creatinine Clearance: 67.1 mL/min (by C-G formula based on SCr of 0.7 mg/dL).    No Known Allergies  Antimicrobials this admission: 9/2 vanc >> 9/2 cefepime x 1  Dose adjustments this admission:   Microbiology results:  9/2 MRSA PCR:   Thank you for allowing pharmacy to be a part of this patient's care.  Arley Phenix RPh 02/26/2023, 10:42 PM

## 2023-02-26 NOTE — Assessment & Plan Note (Signed)
Pt on chronic methadone 23 mg per day. Pt stable and receives 27 take home doses every 4 weeks. Verified by pharmacy 02-20-2023. Continue with methadone. Pt may require higher doses of oxycodone for pain control due to hx of opioid tolerance

## 2023-02-26 NOTE — Assessment & Plan Note (Addendum)
Admit to med/surg bed. Pt has been on 5 days of Duricef(1st generation cephalosporin) without any improvement in her cellulitis. Change to IV rocephin/vanco. Check MRSA screen. Check right LE U/S to r/o DVT. Prn oxycodone 10 mg q4h prn pain.

## 2023-02-26 NOTE — ED Triage Notes (Signed)
Pt states she is having swelling in her legs and has been for a while. Pt legs are red and warm. Pt states she is currently taking abx for cellulitis. Pt states she doesn't feel like she is thinking properly anymore. Pt alert and oriented x4 in triage. Pt also endorses constipation.

## 2023-02-26 NOTE — Assessment & Plan Note (Signed)
Chronic Stable 

## 2023-02-26 NOTE — Subjective & Objective (Addendum)
CC: right leg erythema HPI: 72 year old African-American female history of AIDS on HAART therapy, opioid dependence on chronic methadone 23 mg a day, obesity BMI of 32.7, chronic thrombocytopenia, chronic leukopenia, history of colon cancer status post resection presents to the ER today with a 2-week history of redness to the right leg.  She states that she may have gotten bit by something while she was walking in the yard.  She denies walking around in bare feet.  She was seen in the ER on 02/20/2023.  She was given a prescription for Duricef.  She has been taking this for the last 5 days.  She has not noted any improvement.  The right leg is continued to be erythematous and warm.  Is not draining anything.  She has some edema.  Continues to be painful.  Patient also complains of constipation.  No fever or chills.  Due to continued pain and erythema, patient presented back to the ER for evaluation.  On arrival temp 99.7 heart rate 73 blood pressure 110/72 satting 100% on room air.  Weight 83.9 kg.  BMI 32.7  White count 5.0, hemoglobin 14.1, platelets of 108  Sodium 137, potassium 5.1, chloride 104, bicarb 25, BUN of 10, creatinine 0.7, glucose 123  [facility] hospitalist consulted.

## 2023-02-26 NOTE — ED Provider Notes (Signed)
I provided a substantive portion of the care of this patient.  I personally made/approved the management plan for this patient and take responsibility for the patient management.  EKG Interpretation Date/Time:  Monday February 26 2023 19:29:58 EDT Ventricular Rate:  63 PR Interval:  113 QRS Duration:  97 QT Interval:  437 QTC Calculation: 448 R Axis:   56  Text Interpretation: Sinus rhythm Borderline short PR interval Borderline abnrm T, anterolateral leads No significant change since last tracing Confirmed by Lorre Nick ([id number]) on 02/26/2023 8:30:11 PM   Patient's EKG prior to potation shows normal sinus rhythm.  Patient with cellulitis has felt outpatient therapy.  Will require IV antibiotics and admission   Lorre Nick, MD 02/26/23 2113

## 2023-02-26 NOTE — ED Provider Notes (Signed)
[place] EMERGENCY DEPARTMENT AT [facility] Provider Note   CSN: [id number] Arrival date & time: 02/26/23  1727     History  Chief Complaint  Patient presents with   Cellulitis   Constipation    Ruth Stephenson is a 72 y.o. female with past medical history significant for HIV/AIDS, hepatitis C, colon cancer, polysubstance abuse, thrombocytopenia, GERD presents to the ED complaining of severe pain, swelling and redness to her right leg.  Patient was seen 6 days ago and diagnosed with cellulitis.  She has been taking her oral antibiotics.  She states the redness has been spreading and the pain has become unbearable.  Patient also reports some constipation, but her main complaint is her leg.  Denies fever, chills, abdominal pain, nausea, vomiting.  She reports she takes her HIV medication "most days" and that her last viral load was undetectable.        Home Medications Prior to Admission medications   Medication Sig Start Date End Date Taking? Authorizing Provider  cefadroxil (DURICEF) 500 MG capsule Take 1 capsule (500 mg total) by mouth 2 (two) times daily for 7 days. 02/20/23 02/27/23  Kommor, Madison, MD  DOVATO 50-300 MG tablet TAKE 1 TABLET BY MOUTH EVERY DAY 11/29/22   [name], MD  furosemide (LASIX) 20 MG tablet TAKE 1 TABLET BY MOUTH EVERY DAY Patient not taking: Reported on 02/20/2023 08/29/22   [name], MD  [facility] BILOBA PLUS PO Take 1 capsule by mouth daily.    [provider]  methadone (DOLOPHINE) 10 MG/5ML solution Take 23 mg by mouth in the morning.    [provider]      Allergies    Patient has no known allergies.    Review of Systems   Review of Systems  Constitutional:  Negative for chills and fever.  Cardiovascular:  Positive for leg swelling (R leg swelling).  Gastrointestinal:  Positive for constipation. Negative for abdominal pain, nausea and vomiting.  Skin:  Positive for color change.    Physical  Exam Updated Vital Signs BP 108/64 (BP Location: Left Arm)   Pulse 65   Temp 98.1 F (36.7 C) (Oral)   Resp 18   Ht 5\' 3"  (1.6 m)   Wt 83.9 kg   SpO2 95%   BMI 32.77 kg/m  Physical Exam Vitals and nursing note reviewed.  Constitutional:      General: She is in acute distress.     Appearance: Normal appearance. She is ill-appearing. She is not diaphoretic.     Comments: Patient writhing in bed, appears to be in significant pain.  Cardiovascular:     Rate and Rhythm: Normal rate and regular rhythm.  Pulmonary:     Effort: Pulmonary effort is normal.  Musculoskeletal:     Right lower leg: Swelling and tenderness present. 3+ Edema present.       Legs:     Comments: Erythema and swelling to right lower extremity with tenderness to light palpation.  Erythema extends from right upper leg to the foot.  Area is indurated.    Skin:    General: Skin is warm and dry.     Capillary Refill: Capillary refill takes less than 2 seconds.  Neurological:     Mental Status: She is alert. Mental status is at baseline.  Psychiatric:        Mood and Affect: Mood normal.        Behavior: Behavior normal.     ED  Results / Procedures / Treatments   Labs (all labs ordered are listed, but only abnormal results are displayed) Labs Reviewed  BASIC METABOLIC PANEL - Abnormal; Notable for the following components:      Result Value   Glucose, Bld 123 (*)    Calcium 8.7 (*)    All other components within normal limits  CBC WITH DIFFERENTIAL/PLATELET - Abnormal; Notable for the following components:   MCV 101.7 (*)    Platelets 108 (*)    All other components within normal limits  CBC WITH DIFFERENTIAL/PLATELET    EKG EKG Interpretation Date/Time:  Monday February 26 2023 19:29:58 EDT Ventricular Rate:  63 PR Interval:  113 QRS Duration:  97 QT Interval:  437 QTC Calculation: 448 R Axis:   56  Text Interpretation: Sinus rhythm Borderline short PR interval Borderline abnrm T,  anterolateral leads No significant change since last tracing Confirmed by Lorre Nick ([id number]) on 02/26/2023 8:30:11 PM  Radiology No results found.  Procedures Procedures    Medications Ordered in ED Medications  morphine (PF) 4 MG/ML injection 4 mg (has no administration in time range)  vancomycin (VANCOCIN) IVPB 1000 mg/200 mL premix (has no administration in time range)  ceFEPIme (MAXIPIME) 2 g in sodium chloride 0.9 % 100 mL IVPB (has no administration in time range)    ED Course/ Medical Decision Making/ A&P                                 Medical Decision Making Amount and/or Complexity of Data Reviewed Labs: ordered.  Risk Prescription drug management.   This patient presents to the ED with chief complaint(s) of worsening cellulitis with pertinent past medical history of HIV/AIDS.  The complaint involves an extensive differential diagnosis and also carries with it a high risk of complications and morbidity.    The differential diagnosis includes worsening cellulitis,    The initial plan is to obtain labs  Additional history obtained: Records reviewed  - patient was seen in the ED on 02/20/23 and diagnosed with cellulitis.  Patient was given IV Rocephin and sent home on cefadroxil. Reviewed previous CD4 count from 10/03/22 which was 151.  Initial Assessment:   On exam, patient appears to be in distress and is writhing around the bed in pain.  There is significant erythema, swelling, and tenderness to light touch of the right leg.  There is extension of the erythema above the knee into the right upper leg with palpable induration.  DP pulse is 2+.    Independent ECG/labs interpretation:  The following labs were independently interpreted:  CBC without leukocytosis, leukopenia, or anemia.  Metabolic panel without major electrolyte disturbance.  Renal function is normal.    Treatment and Reassessment: Will give IV pain medicine and start patient on IV antibiotics -  vancomycin and cefepime.   Consultations obtained:   I requested consultation with on-call hospitalist and spoke with Dr. Carollee Herter who agrees with hospital admission.   Disposition:   Patient to be admitted to [facility] for IV antibiotics to treat R lower leg cellulitis.          Final Clinical Impression(s) / ED Diagnoses Final diagnoses:  Cellulitis of right lower extremity  AIDS (acquired immune deficiency syndrome) [facility])    Rx / DC Orders ED Discharge Orders     None         Lenard Simmer, PA-C 02/26/23 2156  Lorre Nick, MD 02/27/23 [phone]

## 2023-02-27 ENCOUNTER — Inpatient Hospital Stay (HOSPITAL_COMMUNITY): Payer: Medicare HMO

## 2023-02-27 DIAGNOSIS — L03115 Cellulitis of right lower limb: Secondary | ICD-10-CM | POA: Diagnosis not present

## 2023-02-27 DIAGNOSIS — M7989 Other specified soft tissue disorders: Secondary | ICD-10-CM | POA: Diagnosis not present

## 2023-02-27 LAB — CBC WITH DIFFERENTIAL/PLATELET
Abs Immature Granulocytes: 0.04 10*3/uL (ref 0.00–0.07)
Basophils Absolute: 0 10*3/uL (ref 0.0–0.1)
Basophils Relative: 0 %
Eosinophils Absolute: 0 10*3/uL (ref 0.0–0.5)
Eosinophils Relative: 1 %
HCT: 39 % (ref 36.0–46.0)
Hemoglobin: 12.9 g/dL (ref 12.0–15.0)
Immature Granulocytes: 1 %
Lymphocytes Relative: 31 %
Lymphs Abs: 0.9 10*3/uL (ref 0.7–4.0)
MCH: 33.9 pg (ref 26.0–34.0)
MCHC: 33.1 g/dL (ref 30.0–36.0)
MCV: 102.4 fL — ABNORMAL HIGH (ref 80.0–100.0)
Monocytes Absolute: 0.3 10*3/uL (ref 0.1–1.0)
Monocytes Relative: 12 %
Neutro Abs: 1.6 10*3/uL — ABNORMAL LOW (ref 1.7–7.7)
Neutrophils Relative %: 55 %
Platelets: 99 10*3/uL — ABNORMAL LOW (ref 150–400)
RBC: 3.81 MIL/uL — ABNORMAL LOW (ref 3.87–5.11)
RDW: 13.4 % (ref 11.5–15.5)
WBC: 2.9 10*3/uL — ABNORMAL LOW (ref 4.0–10.5)
nRBC: 0 % (ref 0.0–0.2)

## 2023-02-27 LAB — COMPREHENSIVE METABOLIC PANEL
ALT: 14 U/L (ref 0–44)
AST: 26 U/L (ref 15–41)
Albumin: 2.8 g/dL — ABNORMAL LOW (ref 3.5–5.0)
Alkaline Phosphatase: 47 U/L (ref 38–126)
Anion gap: 6 (ref 5–15)
BUN: 9 mg/dL (ref 8–23)
CO2: 24 mmol/L (ref 22–32)
Calcium: 8.4 mg/dL — ABNORMAL LOW (ref 8.9–10.3)
Chloride: 109 mmol/L (ref 98–111)
Creatinine, Ser: 0.53 mg/dL (ref 0.44–1.00)
GFR, Estimated: >60 mL/min (ref >60)
Glucose, Bld: 92 mg/dL (ref 70–99)
Potassium: 3.7 mmol/L (ref 3.5–5.1)
Sodium: 139 mmol/L (ref 135–145)
Total Bilirubin: 1.6 mg/dL — ABNORMAL HIGH (ref 0.3–1.2)
Total Protein: 6.7 g/dL (ref 6.5–8.1)

## 2023-02-27 LAB — MRSA NEXT GEN BY PCR, NASAL: MRSA by PCR Next Gen: NOT DETECTED

## 2023-02-27 MED ORDER — METHADONE HCL 10 MG/ML PO CONC
23.0000 mg | Freq: Every day | ORAL | Status: DC
  Administered 2023-02-27 – 2023-03-01 (×3): 23 mg via ORAL
  Filled 2023-02-27 (×3): qty 5

## 2023-02-27 NOTE — Progress Notes (Signed)
RLE venous duplex has been completed.    Results can be found under chart review under CV PROC. 02/27/2023 11:24 AM Rajeev Escue RVT, RDMS

## 2023-02-27 NOTE — Progress Notes (Incomplete)
Introduction: 72 yo F presented to the ED yesterday with a 2-week history of redness to the right leg.    History of present illness: She went to the ED on 8/27 and was given cefadroxil [?? gen] during that admission [past 5 days] She has not noted any improvement. The right leg is continued to be red and warm. No drainage She has some edema. Continues to be painful. - no animal bite/ not diabetic [no need for pseudomonas coverage]    Problem based assessment and plan:  - non purulent cellulitis: [strep G+]  A. Assessment: -Physical exam/relevant labs, diagnostic testing/imaging:  - it looks non purulent  - doppler: no DVT 8/27 [dvt can cause fevers] - was given 2g ceftriaxone one time then cefadroxil 500 mg [usually BID for UTI] - you dont get cultures for non-purulent just tx w abx first  -afebrile -BP is 115/70s -hr in 60s  -rr 17s WBC: 5 on admission --> 2.9 today qTc: 448   -MRSA swab: negative [risk factor for recent hospitalization] also doesn't have a + test for it  PTA -cefadroxil 500 mg capsules  -hospital meds - tylenol 650 mg prn -cefepime 2g one dose --> swtcihed to ceft. -ceftriaxone 1g q24h -oxycodone 10 mg q4h prn -vancomycin 1000 mg q24h [ototoxicity/nephro]  B. Plan: - seems as if the right leg is less erythematous than yesterday and feel less pain  - LD: 900-1350mg  vanc   -we could d/c vancomycin 1000 mg q24h iv - continue the ceftriaxone 1g q24h for 5 day tx   --> outpatient penicillin VK 250-500 mg q6h -possible clinda 600-1.8g in 2-4 divided dose  [Ae: c. diff]  Afebrile, BP stable 120/65, RR: 16 No updated CBC, BMP yet  Cellulitis looks significantly better with less edema/pain --> Dr wants to continue current tx but wants to switch to po if improve tmr   -last vanc dose last night at 930pm [Creatinine at 0.7 baseline no AKi] [Ae: nephro otootoxictiy]  -ceftriaxone 1g q24 at 00:15am [No sign ae]  - AIDs on HAART  A.  Assessment:  -hospital meds - dovato [Dolutegravir 50 mg/lamivudine 300 mg (1 tablet) orally once daily with or without food]   Dolutegravir inhibits HIV integrase by blocking the strand transfer step of retroviral Lamivudine inhibition of reverse transcriptase via DNA chain termination after incorporation of the nucleotide analogue  [cephalosporin and beta-lac better to cover for MSSA compared to vanco]   AE: anemia <2%   B. Plan:  - maintain PTA meds    infection of CD4+ T cells by the Human Immunodeficiency Virus (HIV). HIV enters these cells, integrates into the host DNA, and replicates, leading to cell death and depletion of CD4+ T cells. As CD4+ T cells decline, the body's ability to mount an immune response weakens, making the person susceptible to opportunistic infections and certain cancers. Over time, without treatment, this leads to the development of AIDS.  Monitoring parameters - CD4   [If HIV is controlled then normal range should be with a person who doesn't have HIV]  What to do when CD4 drops [look at specific thresholds]  - CBC [anemia association]  AC Hold AC when plt <50  Active clot  plt<20-30 Active bleeding directly drop Hgb but not plt    - Viral load [rna sample in the blood --> how well the medication is working]     - opioid depend:  -hospital meds Methadone 23mg   B. Plan:   - maintain PTA meds  Excreted renally so easier to treat UTI [higher concentration in the urine]

## 2023-02-27 NOTE — Progress Notes (Addendum)
PROGRESS NOTE  Ruth Stephenson  ZOX:[id number] DOB: 1950-08-01 DOA: 02/26/2023 PCP: [name], MD   Brief Narrative: Patient is a 72 year old female with history of AIDS on HAART therapy, opioid dependence on chronic methadone, obesity, chronic thrombocytopenia, chronic leukopenia, colon cancer s/p resection who presented with 2-week history of erythema of the right leg.  She states that she might have gotten bitten by something while walking on the yard.  She was treated with [facility] and was taking this for 5 days without any improvement.  Right leg continued to be erythematous and warm without edema or drainage.  On presentation she was hemodynamically stable, afebrile, no leukocytosis.  Patient was admitted for the management of right lower extremity cellulitis.  Started on broad spectrum antibiotics.  Assessment & Plan:  Principal Problem:   Cellulitis of right leg Active Problems:   AIDS (acquired immune deficiency syndrome) (HCC)   Opioid dependence (HCC)   Obesity (BMI 30-39.9)   DNR (do not resuscitate)/DNI(Do Not Intubate)   Thrombocytopenia (HCC)   Cellulitis of the right lower extremity: Failed outpatient antibiotic therapy.  Started on ceftriaxone, vancomycin.  Venous Doppler done on 02/20/2023 of the right lower extremity did not show any DVT.  Continue pain management, supportive care.  Continue current antibiotic therapy.  Cellulitis area looks significantly better today and she complains of less pain and edema.  Will continue current biotics today.  If more improvement tomorrow, can change antibiotics to oral and discharge. She has bilateral chronic venous stasis changes.  We recommend to follow-up with vein clinic as an outpatient  Opiate dependence: On chronic methadone.  Continue the same  AIDS: On Dovato, follows with ID  Leucopenia/Thrombocytopenia: Chronic, stable.  Likely secondary to HIV/HIV medications.  Monitor as an outpatient  Obesity: BMI of  32.7  Goals of care: Patient is DNR        DVT prophylaxis:SCDs Start: 02/26/23 2248     Code Status: Limited: Do not attempt resuscitation (DNR) -DNR-LIMITED -Do Not Intubate/DNI   Family Communication: None at bedside  Patient status:Inpatient  Patient is from :Home  Anticipated discharge WJ:XBJY  Estimated DC date:1-2 days   Consultants: None  Procedures:None  Antimicrobials:  Anti-infectives (From admission, onward)    Start     Dose/Rate Route Frequency Ordered Stop   02/27/23 2200  vancomycin (VANCOCIN) IVPB 1000 mg/200 mL premix        1,000 mg 200 mL/hr over 60 Minutes Intravenous Every 24 hours 02/26/23 2242     02/27/23 1000  dolutegravir-lamiVUDine (DOVATO) 50-300 MG per tablet 1 tablet        1 tablet Oral Daily 02/26/23 2247     02/27/23 0000  cefTRIAXone (ROCEPHIN) 1 g in sodium chloride 0.9 % 100 mL IVPB        1 g 200 mL/hr over 30 Minutes Intravenous Every 24 hours 02/26/23 2247     02/26/23 2115  vancomycin (VANCOCIN) IVPB 1000 mg/200 mL premix        1,000 mg 200 mL/hr over 60 Minutes Intravenous  Once 02/26/23 2103 02/26/23 2306   02/26/23 2115  ceFEPIme (MAXIPIME) 2 g in sodium chloride 0.9 % 100 mL IVPB        2 g 200 mL/hr over 30 Minutes Intravenous  Once 02/26/23 2103 02/26/23 2205       Subjective: Patient seen and examined at bedside today.  Hemodynamically stable.  Afebrile.  Right lower extremity looks less erythematous than yesterday as per the patient.  She  feels less pain.  Objective: Vitals:   02/26/23 2243 02/27/23 0042 02/27/23 0613 02/27/23 0617  BP: 115/73 (!) 104/59 122/78   Pulse: 63 (!) 56 (!) 55   Resp: 18 17 17    Temp: 98.5 F (36.9 C) 98.2 F (36.8 C) 98.2 F (36.8 C)   TempSrc: Oral Oral Oral   SpO2: 95% 93% 94%   Weight:    89.8 kg  Height:    5\' 3"  (1.6 m)    Intake/Output Summary (Last 24 hours) at 02/27/2023 0740 Last data filed at 02/27/2023 0300 Gross per 24 hour  Intake 580 ml  Output --  Net 580  ml   Filed Weights   02/26/23 1739 02/27/23 0617  Weight: 83.9 kg 89.8 kg    Examination:  General exam: Overall comfortable, not in distress, lying in bed,obese HEENT: PERRL Respiratory system:  no wheezes or crackles  Cardiovascular system: S1 & S2 heard, RRR.  Gastrointestinal system: Abdomen is nondistended, soft and nontender. Central nervous system: Alert and oriented Extremities: Mild, erythema of right lower extremity, chronic bilateral lower extremity venous stasis changes Skin: No rashes, no ulcers,no icterus     Data Reviewed: I have personally reviewed following labs and imaging studies  CBC: Recent Labs  Lab 02/20/23 2200 02/26/23 2045  WBC 1.9* 5.0  NEUTROABS 0.7* 3.5  HGB 11.9* 14.1  HCT 36.0 42.8  MCV 102.0* 101.7*  PLT 92* 108*   Basic Metabolic Panel: Recent Labs  Lab 02/20/23 1852 02/26/23 1804 02/27/23 0356  NA 139 137 139  K 4.1 5.1 3.7  CL 105 104 109  CO2 28 25 24   GLUCOSE 91 123* 92  BUN 11 10 9   CREATININE 0.63 0.70 0.53  CALCIUM 9.0 8.7* 8.4*     Recent Results (from the past 240 hour(s))  MRSA Next Gen by PCR, Nasal     Status: None   Collection Time: 02/26/23 11:10 PM   Specimen: Nasal Mucosa; Nasal Swab  Result Value Ref Range Status   MRSA by PCR Next Gen NOT DETECTED NOT DETECTED Final    Comment: (NOTE) The GeneXpert MRSA Assay (FDA approved for NASAL specimens only), is one component of a comprehensive MRSA colonization surveillance program. It is not intended to diagnose MRSA infection nor to guide or monitor treatment for MRSA infections. Test performance is not FDA approved in patients less than 24 years old. Performed at [facility], [address] [place], [place] [address]      Radiology Studies: No results found.  Scheduled Meds:  bisacodyl  10 mg Oral Daily   dolutegravir-lamiVUDine  1 tablet Oral Daily   methadone  23 mg Oral Daily   polyethylene glycol  34 g Oral Daily    Continuous Infusions:  cefTRIAXone (ROCEPHIN)  IV 1 g (02/27/23 0024)   vancomycin       LOS: 1 day   Burnadette Pop, MD [facility] P9/08/2022, 7:40 AM

## 2023-02-27 NOTE — Progress Notes (Signed)
   02/27/23 1555  TOC Brief Assessment  Insurance and Status Reviewed  Patient has primary care physician Yes  Home environment has been reviewed home alone  Prior level of function: independent  Prior/Current Home Services No current home services  Social Determinants of Health Reivew SDOH reviewed no interventions necessary  Readmission risk has been reviewed Yes  Transition of care needs no transition of care needs at this time

## 2023-02-28 DIAGNOSIS — E669 Obesity, unspecified: Secondary | ICD-10-CM | POA: Diagnosis not present

## 2023-02-28 DIAGNOSIS — B20 Human immunodeficiency virus [HIV] disease: Secondary | ICD-10-CM | POA: Diagnosis not present

## 2023-02-28 DIAGNOSIS — L03115 Cellulitis of right lower limb: Secondary | ICD-10-CM | POA: Diagnosis not present

## 2023-02-28 DIAGNOSIS — Z66 Do not resuscitate: Secondary | ICD-10-CM | POA: Diagnosis not present

## 2023-02-28 LAB — COMPREHENSIVE METABOLIC PANEL
ALT: 15 U/L (ref 0–44)
AST: 27 U/L (ref 15–41)
Albumin: 3 g/dL — ABNORMAL LOW (ref 3.5–5.0)
Alkaline Phosphatase: 52 U/L (ref 38–126)
Anion gap: 9 (ref 5–15)
BUN: 10 mg/dL (ref 8–23)
CO2: 24 mmol/L (ref 22–32)
Calcium: 8.5 mg/dL — ABNORMAL LOW (ref 8.9–10.3)
Chloride: 105 mmol/L (ref 98–111)
Creatinine, Ser: 0.7 mg/dL (ref 0.44–1.00)
GFR, Estimated: >60 mL/min (ref >60)
Glucose, Bld: 124 mg/dL — ABNORMAL HIGH (ref 70–99)
Potassium: 3.9 mmol/L (ref 3.5–5.1)
Sodium: 138 mmol/L (ref 135–145)
Total Bilirubin: 1.2 mg/dL (ref 0.3–1.2)
Total Protein: 7.2 g/dL (ref 6.5–8.1)

## 2023-02-28 LAB — CBC WITH DIFFERENTIAL/PLATELET
Abs Immature Granulocytes: 0.01 10*3/uL (ref 0.00–0.07)
Basophils Absolute: 0 10*3/uL (ref 0.0–0.1)
Basophils Relative: 1 %
Eosinophils Absolute: 0 10*3/uL (ref 0.0–0.5)
Eosinophils Relative: 1 %
HCT: 39.1 % (ref 36.0–46.0)
Hemoglobin: 12.7 g/dL (ref 12.0–15.0)
Immature Granulocytes: 1 %
Lymphocytes Relative: 29 %
Lymphs Abs: 0.6 10*3/uL — ABNORMAL LOW (ref 0.7–4.0)
MCH: 33.2 pg (ref 26.0–34.0)
MCHC: 32.5 g/dL (ref 30.0–36.0)
MCV: 102.1 fL — ABNORMAL HIGH (ref 80.0–100.0)
Monocytes Absolute: 0.3 10*3/uL (ref 0.1–1.0)
Monocytes Relative: 13 %
Neutro Abs: 1.2 10*3/uL — ABNORMAL LOW (ref 1.7–7.7)
Neutrophils Relative %: 55 %
Platelets: 100 10*3/uL — ABNORMAL LOW (ref 150–400)
RBC: 3.83 MIL/uL — ABNORMAL LOW (ref 3.87–5.11)
RDW: 13.3 % (ref 11.5–15.5)
WBC: 2.2 10*3/uL — ABNORMAL LOW (ref 4.0–10.5)
nRBC: 0 % (ref 0.0–0.2)

## 2023-02-28 LAB — MAGNESIUM: Magnesium: 2 mg/dL (ref 1.7–2.4)

## 2023-02-28 LAB — PHOSPHORUS: Phosphorus: 2.4 mg/dL — ABNORMAL LOW (ref 2.5–4.6)

## 2023-02-28 MED ORDER — K PHOS MONO-SOD PHOS DI & MONO 155-852-130 MG PO TABS
500.0000 mg | ORAL_TABLET | Freq: Once | ORAL | Status: AC
  Administered 2023-02-28: 500 mg via ORAL
  Filled 2023-02-28: qty 2

## 2023-02-28 NOTE — Plan of Care (Signed)
?  Problem: Activity: ?Goal: Risk for activity intolerance will decrease ?Outcome: Progressing ?  ?Problem: Safety: ?Goal: Ability to remain free from injury will improve ?Outcome: Progressing ?  ?Problem: Pain Managment: ?Goal: General experience of comfort will improve ?Outcome: Progressing ?  ?

## 2023-02-28 NOTE — Hospital Course (Addendum)
The patient is a 72 year old African-American female with a past medical history significant for monitoring of HIV AIDS on HAART therapy, opioid dependence on chronic methadone, obesity, chronic thrombocytopenia and leukopenia as well as history of colon cancer s/p resection who presented with a 2-week history of leg erythema of the right leg.  She states that she might have gotten bitten by something while walking in the yard and she is treated with Duricef and was taking this for 5 days without any improvement.  The right leg continue to be erythematous and warm without any drainage or edema.  On presentation she was hemodynamically stable and was afebrile and no leukocytosis.  She was admitted for the management of her right lower extremity cellulitis which was had failed outpatient antibiotics.    She is initiated on broad-spectrum antibiotics with IV ceftriaxone and vancomycin and is now improving slowly.  Leg is not erythematous and starting to improve a little bit.  Recommending elevation and continuing to monitor and we will discharge home on oral antibiotics given her improvement and will need to follow-up with PCP and infectious disease in outpatient setting.  Assessment and Plan:  Cellulitis of the Right Lower Extremity, improving -Failed outpatient antibiotic therapy.   -Started on IV Ceftriaxone and IV Vancomycin which is being continued  -Venous Doppler done on 02/20/2023 of the right lower extremity did not show any DVT.  Repeat Venous Duplex done and showed "There is no evidence of deep vein thrombosis in the lower extremity. No cystic structure found in the popliteal fossa. Ultrasound characteristics of enlarged lymph nodes are noted in the groin. LEFT: No evidence of common femoral vein obstruction."  -Continue pain management, supportive care.   -C/w Elevation  -Cellulitis area looks significantly better today and she complains of less pain and edema.   -Since she continued have  further improvement we have changed to oral antibiotics and will discharge.  She is now on oral doxycycline and Augmentin -She has bilateral chronic venous stasis changes.  We recommend to follow-up with vein clinic as an outpatient and will refer to Vascular at D/C and referral has been made   Opiate Dependence -On chronic Methadone.  Continue the same with Methadone 23 mg po Daily  -C/w Bowel regimen with Bisacodyl 10 mg po Daily   HIV/AIDS -C/w Dolutegravir-Lamivudine 50-300 mg per tab 1 tab po Daily  -Follow up with Infectious Diseases as an outpatient; She sees Dr. Ninetta Lights as an outpatient   Hypophosphatemia -Phos Level was 2.4 and improved to 2.6; Replete with po K Phos Neutral 500 mg x1 -Continue to Monitor and Replete as Necessary  -Repeat Phos Level level within 1 week  Pancytopenia -Chronic, stable.  Likely secondary to HIV/HIV medications  -CBC Trend: Recent Labs  Lab 02/20/23 2200 02/26/23 2045 02/27/23 0909 02/28/23 0948 03/01/23 0901  WBC 1.9* 5.0 2.9* 2.2* 2.0*  HGB 11.9* 14.1 12.9 12.7 13.6  HCT 36.0 42.8 39.0 39.1 42.1  MCV 102.0* 101.7* 102.4* 102.1* 104.2*  PLT 92* 108* 99* 100* 102*  -Macrocytic Anemia is improved. Check Anemia Panel in the outpatient setting  -Continue to Monitor for S/Sx of Bleeding; No overt bleeding noted -Repeat CBC within 1 week  Hyperbilirubinemia -Bilirubin Trend: Recent Labs  Lab 02/20/23 1852 02/27/23 0356 02/28/23 0948 03/01/23 0901  BILITOT 1.5* 1.6* 1.2 1.0  -Continue to Monitor and Trend and Repeat CMP within 1 week  Hypoalbuminemia -Patient's Albumin Trend: Recent Labs  Lab 02/20/23 1852 02/27/23 0356 02/28/23 0948 03/01/23 0901  ALBUMIN 3.3* 2.8* 3.0* 3.1*  -Continue to Monitor and Trend and repeat CMP within 1 week  Obesity -Complicates overall prognosis and care -Estimated body mass index is 35.62 kg/m as calculated from the following:   Height as of this encounter: 5\' 3"  (1.6 m).   Weight as of this  encounter: 91.2 kg.  -Weight Loss and Dietary Counseling given

## 2023-02-28 NOTE — Progress Notes (Signed)
PROGRESS NOTE    AMANDAH Stephenson  GUR:[id number] DOB: 11/18/1950 DOA: 02/26/2023 PCP: [name], MD   Brief Narrative:  The patient is a 72 year old African-American female with a past medical history significant for monitoring of HIV AIDS on HAART therapy, opioid dependence on chronic methadone, obesity, chronic thrombocytopenia and leukopenia as well as history of colon cancer s/p resection who presented with a 2-week history of leg erythema of the right leg.  She states that she might have gotten bitten by something while walking in the yard and she is treated with Duricef and was taking this for 5 days without any improvement.  The right leg continue to be erythematous and warm without any drainage or edema.  On presentation she was hemodynamically stable and was afebrile and no leukocytosis.  She was admitted for the management of her right lower extremity cellulitis which was had failed outpatient antibiotics.  She is initiated on broad-spectrum antibiotics with IV ceftriaxone and vancomycin and is now improving slowly.  Leg is not erythematous and starting to improve a little bit.  Recommending elevation and continuing to monitor and discharging next 24 to 48 hours of further improvement.  Assessment and Plan:  Cellulitis of the Right Lower Extremity -Failed outpatient antibiotic therapy.   -Started on IV Ceftriaxone and IV Vancomycin which is being continued  -Venous Doppler done on 02/20/2023 of the right lower extremity did not show any DVT.  Repeat Venous Duplex done and showed "There is no evidence of deep vein thrombosis in the lower extremity. No cystic structure found in the popliteal fossa. Ultrasound characteristics of enlarged lymph nodes are noted in the groin. LEFT: No evidence of common femoral vein obstruction."  -Continue pain management, supportive care.   -C/w Elevation  -Cellulitis area looks significantly better today and she complains of less pain and edema.   -If  more improvement tomorrow, can change antibiotics to oral and discharge. -She has bilateral chronic venous stasis changes.  We recommend to follow-up with vein clinic as an outpatient and will refer to Vascular at D/C    Opiate Dependence -On chronic Methadone.  Continue the same with Methadone 23 mg po Daily  -C/w Bowel regimen with Bisacodyl 10 mg po Daily   HIV/AIDS -C/w Dolutegravir-Lamivudine 50-300 mg per tab 1 tab po Daily  -Follow up with Infectious Diseases as an outpatient; She sees Dr. Ninetta Lights as an outpatient   Hypophosphatemia -Phos Level was 2.4; Replete with po K Phos Neutral 500 mg x1 -Continue to Monitor and Replete as Necessary  -Repeat Phos Level in the AM   Pancytopenia -Chronic, stable.  Likely secondary to HIV/HIV medications  -CBC Trend: Recent Labs  Lab 02/20/23 2200 02/26/23 2045 02/27/23 0909 02/28/23 0948  WBC 1.9* 5.0 2.9* 2.2*  HGB 11.9* 14.1 12.9 12.7  HCT 36.0 42.8 39.0 39.1  MCV 102.0* 101.7* 102.4* 102.1*  PLT 92* 108* 99* 100*  -Macrocytic Anemia is improved. Check Anemia Panel in the outpatient setting  -Continue to Monitor for S/Sx of Bleeding; No overt bleeding noted -Repeat CBC in the AM   Hyperbilirubinemia -Bilirubin Trend: Recent Labs  Lab 02/20/23 1852 02/27/23 0356 02/28/23 0948  BILITOT 1.5* 1.6* 1.2  -Continue to Monitor and Trend and Repeat CMP in the AM  Hypoalbuminemia -Patient's Albumin Trend: Recent Labs  Lab 02/20/23 1852 02/27/23 0356 02/28/23 0948  ALBUMIN 3.3* 2.8* 3.0*  -Continue to Monitor and Trend and repeat CMP in the AM  Obesity -Complicates overall prognosis and care -Estimated  body mass index is 35.62 kg/m as calculated from the following:   Height as of this encounter: 5\' 3"  (1.6 m).   Weight as of this encounter: 91.2 kg.  -Weight Loss and Dietary Counseling given   DVT prophylaxis: SCDs Start: 02/26/23 2248    Code Status: Limited: Do not attempt resuscitation (DNR) -DNR-LIMITED -Do Not  Intubate/DNI  Family Communication: No family currently at bedside  Disposition Plan:  Level of care: Med-Surg Status is: Inpatient Remains inpatient appropriate because: Anticipating discharging home in the next 24 to 48 hours of further improved   Consultants:  None  Procedures:  As delineated as above  Antimicrobials:  Anti-infectives (From admission, onward)    Start     Dose/Rate Route Frequency Ordered Stop   02/27/23 2200  vancomycin (VANCOCIN) IVPB 1000 mg/200 mL premix        1,000 mg 200 mL/hr over 60 Minutes Intravenous Every 24 hours 02/26/23 2242     02/27/23 1000  dolutegravir-lamiVUDine (DOVATO) 50-300 MG per tablet 1 tablet        1 tablet Oral Daily 02/26/23 2247     02/27/23 0000  cefTRIAXone (ROCEPHIN) 1 g in sodium chloride 0.9 % 100 mL IVPB        1 g 200 mL/hr over 30 Minutes Intravenous Every 24 hours 02/26/23 2247     02/26/23 2115  vancomycin (VANCOCIN) IVPB 1000 mg/200 mL premix        1,000 mg 200 mL/hr over 60 Minutes Intravenous  Once 02/26/23 2103 02/26/23 2306   02/26/23 2115  ceFEPIme (MAXIPIME) 2 g in sodium chloride 0.9 % 100 mL IVPB        2 g 200 mL/hr over 30 Minutes Intravenous  Once 02/26/23 2103 02/26/23 2205       Subjective: Seen and examined at bedside and thinks her leg is still erythematous but not as much.  Thinks that the swelling has gone down and states that she has more motion and not as much pain in her foot.  Thinks the redness is slowly improving as well.  No lightheadedness or dizziness.  No other concerns or complaints this time.  Objective: Vitals:   02/27/23 2202 02/28/23 0457 02/28/23 0500 02/28/23 1345  BP: 101/65 120/65  120/66  Pulse: (!) 57 (!) 58  (!) 55  Resp: 14 16  15   Temp: 98 F (36.7 C) 98.5 F (36.9 C)  97.7 F (36.5 C)  TempSrc: Oral Oral  Oral  SpO2: 97% 94%  97%  Weight:   91.2 kg   Height:        Intake/Output Summary (Last 24 hours) at 02/28/2023 1629 Last data filed at 02/27/2023 1753 Gross  per 24 hour  Intake 100 ml  Output --  Net 100 ml   Filed Weights   02/26/23 1739 02/27/23 0617 02/28/23 0500  Weight: 83.9 kg 89.8 kg 91.2 kg   Examination: Physical Exam:  Constitutional: WN/WD obese African-American female in no acute distress appears calm Respiratory: Diminished to auscultation bilaterally, no wheezing, rales, rhonchi or crackles. Normal respiratory effort and patient is not tachypenic. No accessory muscle use.  Unlabored breathing Cardiovascular: RRR, no murmurs / rubs / gallops. S1 and S2 auscultated.  Has right lower extremity edema and erythema with some lower extremity venous stasis changes as well bilaterally Abdomen: Soft, non-tender, distended secondary to body habitus. Bowel sounds positive.  GU: Deferred. Musculoskeletal: No clubbing / cyanosis of digits/nails. No joint deformity upper and lower extremities.  Skin:  No rashes, lesions, ulcers on limited skin evaluation. No induration; Warm and dry.  Neurologic: CN 2-12 grossly intact with no focal deficits. Romberg sign cerebellar reflexes not assessed.  Psychiatric: Normal judgment and insight. Alert and oriented x 3. Normal mood and appropriate affect.   Data Reviewed: I have personally reviewed following labs and imaging studies  CBC: Recent Labs  Lab 02/26/23 2045 02/27/23 0909 02/28/23 0948  WBC 5.0 2.9* 2.2*  NEUTROABS 3.5 1.6* 1.2*  HGB 14.1 12.9 12.7  HCT 42.8 39.0 39.1  MCV 101.7* 102.4* 102.1*  PLT 108* 99* 100*   Basic Metabolic Panel: Recent Labs  Lab 02/26/23 1804 02/27/23 0356 02/28/23 0948  NA 137 139 138  K 5.1 3.7 3.9  CL 104 109 105  CO2 25 24 24   GLUCOSE 123* 92 124*  BUN 10 9 10   CREATININE 0.70 0.53 0.70  CALCIUM 8.7* 8.4* 8.5*  MG  --   --  2.0  PHOS  --   --  2.4*   GFR: Estimated Creatinine Clearance: 70.1 mL/min (by C-G formula based on SCr of 0.7 mg/dL). Liver Function Tests: Recent Labs  Lab 02/27/23 0356 02/28/23 0948  AST 26 27  ALT 14 15   ALKPHOS 47 52  BILITOT 1.6* 1.2  PROT 6.7 7.2  ALBUMIN 2.8* 3.0*   No results for input(s): "LIPASE", "AMYLASE" in the last 168 hours. No results for input(s): "AMMONIA" in the last 168 hours. Coagulation Profile: No results for input(s): "INR", "PROTIME" in the last 168 hours. Cardiac Enzymes: No results for input(s): "CKTOTAL", "CKMB", "CKMBINDEX", "TROPONINI" in the last 168 hours. BNP (last 3 results) No results for input(s): "PROBNP" in the last 8760 hours. HbA1C: No results for input(s): "HGBA1C" in the last 72 hours. CBG: No results for input(s): "GLUCAP" in the last 168 hours. Lipid Profile: No results for input(s): "CHOL", "HDL", "LDLCALC", "TRIG", "CHOLHDL", "LDLDIRECT" in the last 72 hours. Thyroid Function Tests: No results for input(s): "TSH", "T4TOTAL", "FREET4", "T3FREE", "THYROIDAB" in the last 72 hours. Anemia Panel: No results for input(s): "VITAMINB12", "FOLATE", "FERRITIN", "TIBC", "IRON", "RETICCTPCT" in the last 72 hours. Sepsis Labs: No results for input(s): "PROCALCITON", "LATICACIDVEN" in the last 168 hours.  Recent Results (from the past 240 hour(s))  MRSA Next Gen by PCR, Nasal     Status: None   Collection Time: 02/26/23 11:10 PM   Specimen: Nasal Mucosa; Nasal Swab  Result Value Ref Range Status   MRSA by PCR Next Gen NOT DETECTED NOT DETECTED Final    Comment: (NOTE) The GeneXpert MRSA Assay (FDA approved for NASAL specimens only), is one component of a comprehensive MRSA colonization surveillance program. It is not intended to diagnose MRSA infection nor to guide or monitor treatment for MRSA infections. Test performance is not FDA approved in patients less than 68 years old. Performed at [facility], [address], [place], [place] [address]     Radiology Studies: VAS [place] LOWER EXTREMITY VENOUS (DVT)  Result Date: 02/27/2023  Lower Venous DVT Study Patient Name:  ROBYNE FRASCA  Date of Exam:   02/27/2023 Medical Rec #:  [id number]        Accession #:    [id number] Date of Birth: January 04, 1951        Patient Gender: F Patient Age:   45 years Exam Location:  [facility] Procedure:      VAS [place] LOWER EXTREMITY VENOUS (DVT) Referring Phys: ERIC CHEN --------------------------------------------------------------------------------  Indications: Pain, Swelling, and Erythema.  Comparison Study: Previous exam on 02/20/2023 was negative for DVT Performing Technologist: Ernestene Mention RVT, RDMS  Examination Guidelines: A complete evaluation includes B-mode imaging, spectral Doppler, color Doppler, and power Doppler as needed of all accessible portions of each vessel. Bilateral testing is considered an integral part of a complete examination. Limited examinations for reoccurring indications may be performed as noted. The reflux portion of the exam is performed with the patient in reverse Trendelenburg.  +---------+---------------+---------+-----------+----------+--------------+ RIGHT    CompressibilityPhasicitySpontaneityPropertiesThrombus Aging +---------+---------------+---------+-----------+----------+--------------+ CFV      Full           Yes      Yes                                 +---------+---------------+---------+-----------+----------+--------------+ SFJ      Full                                                        +---------+---------------+---------+-----------+----------+--------------+ FV Prox  Full           Yes      Yes                                 +---------+---------------+---------+-----------+----------+--------------+ FV Mid   Full           Yes      Yes                                 +---------+---------------+---------+-----------+----------+--------------+ FV DistalFull           Yes      Yes                                 +---------+---------------+---------+-----------+----------+--------------+ PFV      Full                                                         +---------+---------------+---------+-----------+----------+--------------+ POP      Full           Yes      Yes                                 +---------+---------------+---------+-----------+----------+--------------+ PTV      Full                                                        +---------+---------------+---------+-----------+----------+--------------+ PERO     Full                                                        +---------+---------------+---------+-----------+----------+--------------+   +----+---------------+---------+-----------+----------+--------------+  LEFTCompressibilityPhasicitySpontaneityPropertiesThrombus Aging +----+---------------+---------+-----------+----------+--------------+ CFV Full           Yes      Yes                                 +----+---------------+---------+-----------+----------+--------------+     Summary: RIGHT: - There is no evidence of deep vein thrombosis in the lower extremity.  - No cystic structure found in the popliteal fossa. - Ultrasound characteristics of enlarged lymph nodes are noted in the groin.  LEFT: - No evidence of common femoral vein obstruction.   *See table(s) above for measurements and observations. Electronically signed by Waverly Ferrari MD on 02/27/2023 at 7:20:57 PM.    Final    VAS [place] LOWER EXTREMITY VENOUS (DVT) (ONLY [facility] & [facility])  Result Date: 02/21/2023  Lower Venous DVT Study Patient Name:  SHARLYN EKERN  Date of Exam:   02/20/2023 Medical Rec #: [id number]        Accession #:    [id number] Date of Birth: September 25, 1950        Patient Gender: F Patient Age:   70 years Exam Location:  [facility] Procedure:      VAS [place] LOWER EXTREMITY VENOUS (DVT) Referring Phys: [facility] --------------------------------------------------------------------------------  Indications: Pain, Swelling, and Erythema.  Limitations: Edema of calf. Comparison Study: No prior study on file Performing Technologist:  Sherren Kerns RVS  Examination Guidelines: A complete evaluation includes B-mode imaging, spectral Doppler, color Doppler, and power Doppler as needed of all accessible portions of each vessel. Bilateral testing is considered an integral part of a complete examination. Limited examinations for reoccurring indications may be performed as noted. The reflux portion of the exam is performed with the patient in reverse Trendelenburg.  +---------+---------------+---------+-----------+----------+-------------------+ RIGHT    CompressibilityPhasicitySpontaneityPropertiesThrombus Aging      +---------+---------------+---------+-----------+----------+-------------------+ CFV      Full           Yes      Yes                                      +---------+---------------+---------+-----------+----------+-------------------+ SFJ      Full                                                             +---------+---------------+---------+-----------+----------+-------------------+ FV Prox  Full                                                             +---------+---------------+---------+-----------+----------+-------------------+ FV Mid   Full                                                             +---------+---------------+---------+-----------+----------+-------------------+ FV DistalFull                                                             +---------+---------------+---------+-----------+----------+-------------------+  PFV      Full                                                             +---------+---------------+---------+-----------+----------+-------------------+ POP      Full           Yes      Yes                                      +---------+---------------+---------+-----------+----------+-------------------+ PTV                                                   patent by color      +---------+---------------+---------+-----------+----------+-------------------+ PERO                                                  Not well visualized +---------+---------------+---------+-----------+----------+-------------------+   +----+---------------+---------+-----------+----------+--------------+ LEFTCompressibilityPhasicitySpontaneityPropertiesThrombus Aging +----+---------------+---------+-----------+----------+--------------+ CFV Full           Yes      Yes                                 +----+---------------+---------+-----------+----------+--------------+    Summary: RIGHT: - There is no evidence of deep vein thrombosis in the lower extremity.  - No cystic structure found in the popliteal fossa. - Ultrasound characteristics of enlarged lymph nodes are noted in the groin.  LEFT: - No evidence of common femoral vein obstruction.   *See table(s) above for measurements and observations. Electronically signed by Waverly Ferrari MD on 02/21/2023 at 5:56:56 AM.    Final     Scheduled Meds:  bisacodyl  10 mg Oral Daily   dolutegravir-lamiVUDine  1 tablet Oral Daily   methadone  23 mg Oral Daily   polyethylene glycol  34 g Oral Daily   Continuous Infusions:  cefTRIAXone (ROCEPHIN)  IV 1 g (02/28/23 0028)   vancomycin 1,000 mg (02/27/23 2135)    LOS: 2 days   Marguerita Merles, DO [facility] Available via Epic secure chat 7am-7pm After these hours, please refer to coverage provider listed on [email or web address] 02/28/2023, 4:29 PM

## 2023-03-01 ENCOUNTER — Other Ambulatory Visit (HOSPITAL_COMMUNITY): Payer: Self-pay

## 2023-03-01 DIAGNOSIS — E669 Obesity, unspecified: Secondary | ICD-10-CM | POA: Diagnosis not present

## 2023-03-01 DIAGNOSIS — B20 Human immunodeficiency virus [HIV] disease: Secondary | ICD-10-CM | POA: Diagnosis not present

## 2023-03-01 DIAGNOSIS — F1129 Opioid dependence with unspecified opioid-induced disorder: Secondary | ICD-10-CM | POA: Diagnosis not present

## 2023-03-01 DIAGNOSIS — L03115 Cellulitis of right lower limb: Secondary | ICD-10-CM | POA: Diagnosis not present

## 2023-03-01 LAB — CBC WITH DIFFERENTIAL/PLATELET
Abs Immature Granulocytes: 0 10*3/uL (ref 0.00–0.07)
Basophils Absolute: 0 10*3/uL (ref 0.0–0.1)
Basophils Relative: 1 %
Eosinophils Absolute: 0 10*3/uL (ref 0.0–0.5)
Eosinophils Relative: 2 %
HCT: 42.1 % (ref 36.0–46.0)
Hemoglobin: 13.6 g/dL (ref 12.0–15.0)
Immature Granulocytes: 0 %
Lymphocytes Relative: 36 %
Lymphs Abs: 0.7 10*3/uL (ref 0.7–4.0)
MCH: 33.7 pg (ref 26.0–34.0)
MCHC: 32.3 g/dL (ref 30.0–36.0)
MCV: 104.2 fL — ABNORMAL HIGH (ref 80.0–100.0)
Monocytes Absolute: 0.2 10*3/uL (ref 0.1–1.0)
Monocytes Relative: 12 %
Neutro Abs: 1 10*3/uL — ABNORMAL LOW (ref 1.7–7.7)
Neutrophils Relative %: 49 %
Platelets: 102 10*3/uL — ABNORMAL LOW (ref 150–400)
RBC: 4.04 MIL/uL (ref 3.87–5.11)
RDW: 13.4 % (ref 11.5–15.5)
WBC: 2 10*3/uL — ABNORMAL LOW (ref 4.0–10.5)
nRBC: 0 % (ref 0.0–0.2)

## 2023-03-01 LAB — COMPREHENSIVE METABOLIC PANEL
ALT: 16 U/L (ref 0–44)
AST: 27 U/L (ref 15–41)
Albumin: 3.1 g/dL — ABNORMAL LOW (ref 3.5–5.0)
Alkaline Phosphatase: 60 U/L (ref 38–126)
Anion gap: 8 (ref 5–15)
BUN: 7 mg/dL — ABNORMAL LOW (ref 8–23)
CO2: 28 mmol/L (ref 22–32)
Calcium: 8.6 mg/dL — ABNORMAL LOW (ref 8.9–10.3)
Chloride: 106 mmol/L (ref 98–111)
Creatinine, Ser: 0.71 mg/dL (ref 0.44–1.00)
GFR, Estimated: >60 mL/min (ref >60)
Glucose, Bld: 92 mg/dL (ref 70–99)
Potassium: 4 mmol/L (ref 3.5–5.1)
Sodium: 142 mmol/L (ref 135–145)
Total Bilirubin: 1 mg/dL (ref 0.3–1.2)
Total Protein: 7.5 g/dL (ref 6.5–8.1)

## 2023-03-01 LAB — MAGNESIUM: Magnesium: 1.9 mg/dL (ref 1.7–2.4)

## 2023-03-01 LAB — PHOSPHORUS: Phosphorus: 2.6 mg/dL (ref 2.5–4.6)

## 2023-03-01 MED ORDER — ONDANSETRON HCL 4 MG PO TABS
4.0000 mg | ORAL_TABLET | Freq: Four times a day (QID) | ORAL | 0 refills | Status: DC | PRN
  Filled 2023-03-01: qty 20, 5d supply, fill #0

## 2023-03-01 MED ORDER — BISACODYL 5 MG PO TBEC
10.0000 mg | DELAYED_RELEASE_TABLET | Freq: Every day | ORAL | 0 refills | Status: DC
  Filled 2023-03-01: qty 30, 15d supply, fill #0

## 2023-03-01 MED ORDER — DOXYCYCLINE HYCLATE 100 MG PO TBEC
100.0000 mg | DELAYED_RELEASE_TABLET | Freq: Two times a day (BID) | ORAL | 0 refills | Status: AC
  Filled 2023-03-01: qty 14, 7d supply, fill #0

## 2023-03-01 MED ORDER — AMOXICILLIN-POT CLAVULANATE 875-125 MG PO TABS
1.0000 | ORAL_TABLET | Freq: Two times a day (BID) | ORAL | 0 refills | Status: AC
  Filled 2023-03-01: qty 14, 7d supply, fill #0

## 2023-03-01 MED ORDER — ACETAMINOPHEN 325 MG PO TABS
650.0000 mg | ORAL_TABLET | Freq: Four times a day (QID) | ORAL | 0 refills | Status: DC | PRN
  Filled 2023-03-01: qty 20, 3d supply, fill #0

## 2023-03-01 NOTE — Plan of Care (Signed)
  Problem: Activity: Goal: Risk for activity intolerance will decrease Outcome: Progressing   Problem: Pain Managment: Goal: General experience of comfort will improve Outcome: Progressing   Problem: Safety: Goal: Ability to remain free from injury will improve Outcome: Progressing   

## 2023-03-01 NOTE — Discharge Summary (Addendum)
Physician Discharge Summary   Patient: Ruth Stephenson MRN: [id number] DOB: 21-Aug-1950  Admit date:     02/26/2023  Discharge date: 03/01/23  Discharge Physician: Marguerita Merles, DO   PCP: [name], MD   Recommendations at discharge:   Follow-up with PCP within 1 to 2 weeks and repeat CBC, CMP, mag, Phos within 1 week Follow-up with infectious disease in outpatient setting within 1 to 2 weeks Follow-up with vascular surgery for outpatient workup for venous stasis and insufficiency  Discharge Diagnoses: Principal Problem:   Cellulitis of right leg Active Problems:   AIDS (acquired immune deficiency syndrome) (HCC)   Opioid dependence (HCC)   Obesity (BMI 30-39.9)   DNR (do not resuscitate)/DNI(Do Not Intubate)   Thrombocytopenia (HCC)  Resolved Problems:   * No resolved hospital problems. [facility] Course: The patient is a 72 year old African-American female with a past medical history significant for monitoring of HIV AIDS on HAART therapy, opioid dependence on chronic methadone, obesity, chronic thrombocytopenia and leukopenia as well as history of colon cancer s/p resection who presented with a 2-week history of leg erythema of the right leg.  She states that she might have gotten bitten by something while walking in the yard and she is treated with Duricef and was taking this for 5 days without any improvement.  The right leg continue to be erythematous and warm without any drainage or edema.  On presentation she was hemodynamically stable and was afebrile and no leukocytosis.  She was admitted for the management of her right lower extremity cellulitis which was had failed outpatient antibiotics.    She is initiated on broad-spectrum antibiotics with IV ceftriaxone and vancomycin and is now improving slowly.  Leg is not erythematous and starting to improve a little bit.  Recommending elevation and continuing to monitor and we will discharge home on oral antibiotics given her  improvement and will need to follow-up with PCP and infectious disease in outpatient setting.  Assessment and Plan:  Cellulitis of the Right Lower Extremity, improving -Failed outpatient antibiotic therapy.   -Started on IV Ceftriaxone and IV Vancomycin which is being continued  -Venous Doppler done on 02/20/2023 of the right lower extremity did not show any DVT.  Repeat Venous Duplex done and showed "There is no evidence of deep vein thrombosis in the lower extremity. No cystic structure found in the popliteal fossa. Ultrasound characteristics of enlarged lymph nodes are noted in the groin. LEFT: No evidence of common femoral vein obstruction."  -Continue pain management, supportive care.   -C/w Elevation  -Cellulitis area looks significantly better today and she complains of less pain and edema.   -Since she continued have further improvement we have changed to oral antibiotics and will discharge.  She is now on oral doxycycline and Augmentin -She has bilateral chronic venous stasis changes.  We recommend to follow-up with vein clinic as an outpatient and will refer to Vascular at D/C and referral has been made   Opiate Dependence -On chronic Methadone.  Continue the same with Methadone 23 mg po Daily  -C/w Bowel regimen with Bisacodyl 10 mg po Daily   HIV/AIDS -C/w Dolutegravir-Lamivudine 50-300 mg per tab 1 tab po Daily  -Follow up with Infectious Diseases as an outpatient; She sees Dr. Ninetta Lights as an outpatient   Hypophosphatemia -Phos Level was 2.4 and improved to 2.6; Replete with po K Phos Neutral 500 mg x1 -Continue to Monitor and Replete as Necessary  -Repeat Phos Level level within 1  week  Pancytopenia -Chronic, stable.  Likely secondary to HIV/HIV medications  -CBC Trend: Recent Labs  Lab 02/20/23 2200 02/26/23 2045 02/27/23 0909 02/28/23 0948 03/01/23 0901  WBC 1.9* 5.0 2.9* 2.2* 2.0*  HGB 11.9* 14.1 12.9 12.7 13.6  HCT 36.0 42.8 39.0 39.1 42.1  MCV 102.0* 101.7*  102.4* 102.1* 104.2*  PLT 92* 108* 99* 100* 102*  -Macrocytic Anemia is improved. Check Anemia Panel in the outpatient setting  -Continue to Monitor for S/Sx of Bleeding; No overt bleeding noted -Repeat CBC within 1 week  Hyperbilirubinemia -Bilirubin Trend: Recent Labs  Lab 02/20/23 1852 02/27/23 0356 02/28/23 0948 03/01/23 0901  BILITOT 1.5* 1.6* 1.2 1.0  -Continue to Monitor and Trend and Repeat CMP within 1 week  Hypoalbuminemia -Patient's Albumin Trend: Recent Labs  Lab 02/20/23 1852 02/27/23 0356 02/28/23 0948 03/01/23 0901  ALBUMIN 3.3* 2.8* 3.0* 3.1*  -Continue to Monitor and Trend and repeat CMP within 1 week  Obesity -Complicates overall prognosis and care -Estimated body mass index is 35.62 kg/m as calculated from the following:   Height as of this encounter: 5\' 3"  (1.6 m).   Weight as of this encounter: 91.2 kg.  -Weight Loss and Dietary Counseling given  Consultants: None Procedures performed: As delineated as above; lower extremity venous duplex Disposition: Home Diet recommendation:  Cardiac diet DISCHARGE MEDICATION: Allergies as of 03/01/2023   No Known Allergies      Medication List     STOP taking these medications    cefadroxil 500 MG capsule Commonly known as: DURICEF       TAKE these medications    acetaminophen 325 MG tablet Commonly known as: TYLENOL Take 2 tablets (650 mg total) by mouth every 6 (six) hours as needed for mild pain (or Fever >/= 101).   amoxicillin-clavulanate 875-125 MG tablet Commonly known as: AUGMENTIN Take 1 tablet by mouth 2 (two) times daily for 7 days.   bisacodyl 5 MG EC tablet Generic drug: bisacodyl Take 2 tablets (10 mg total) by mouth daily.   Dovato 50-300 MG tablet Generic drug: dolutegravir-lamiVUDine TAKE 1 TABLET BY MOUTH EVERY DAY   doxycycline 100 MG EC tablet Commonly known as: DORYX Take 1 tablet (100 mg total) by mouth 2 (two) times daily for 7 days.   GINKGO BILOBA PLUS  PO Take 1 capsule by mouth daily.   methadone 10 MG/5ML solution Commonly known as: DOLOPHINE Take 23 mg by mouth in the morning.   ondansetron 4 MG tablet Commonly known as: ZOFRAN Take 1 tablet (4 mg total) by mouth every 6 (six) hours as needed for nausea.        Follow-up Information     [name], MD. Call.   Specialty: Infectious Diseases Why: Follow up within 1-2 weeks Contact information: [address] [place] [address] [phone]         [facility]. Call.   Why: Follow up for Further Evaluation of Venous Stasis and Treatment options Contact information: [address] [place] [address] [phone]               Discharge Exam: Filed Weights   02/26/23 1739 02/27/23 0617 02/28/23 0500  Weight: 83.9 kg 89.8 kg 91.2 kg   Vitals:   03/01/23 0613 03/01/23 0953  BP: 125/73 (!) 144/77  Pulse: (!) 55 (!) 56  Resp: 14 16  Temp: 98 F (36.7 C) 97.7 F (36.5 C)  SpO2: 98% 98%   Examination: Physical  Exam:  Constitutional: WN/WD obese African-American female in no acute distress Respiratory: Diminished to auscultation bilaterally, no wheezing, rales, rhonchi or crackles. Normal respiratory effort and patient is not tachypenic. No accessory muscle use.  Unlabored breathing Cardiovascular: RRR, no murmurs / rubs / gallops. S1 and S2 auscultated.  Mild lower extremity edema Abdomen: Soft, non-tender, distended secondary by habitus. Bowel sounds positive.  GU: Deferred. Musculoskeletal: No clubbing / cyanosis of digits/nails. No joint deformity upper and lower extremities.  Skin: Has chronic venous stasis changes in the lower extremities and has some erythematous changes on the right leg with some warmth which is improved Neurologic: CN 2-12 grossly intact with no focal deficits. Romberg sign and cerebellar reflexes not assessed.  Psychiatric: Normal judgment and insight. Alert and oriented x  3. Normal mood and appropriate affect.   Condition at discharge: stable  The results of significant diagnostics from this hospitalization (including imaging, microbiology, ancillary and laboratory) are listed below for reference.   Imaging Studies: VAS [place] LOWER EXTREMITY VENOUS (DVT)  Result Date: 02/27/2023  Lower Venous DVT Study Patient Name:  RUSHIKA HABERL  Date of Exam:   02/27/2023 Medical Rec #: [id number]        Accession #:    [id number] Date of Birth: 03-May-1951        Patient Gender: F Patient Age:   57 years Exam Location:  [facility] Procedure:      VAS [place] LOWER EXTREMITY VENOUS (DVT) Referring Phys: ERIC CHEN --------------------------------------------------------------------------------  Indications: Pain, Swelling, and Erythema.  Comparison Study: Previous exam on 02/20/2023 was negative for DVT Performing Technologist: Ernestene Mention RVT, RDMS  Examination Guidelines: A complete evaluation includes B-mode imaging, spectral Doppler, color Doppler, and power Doppler as needed of all accessible portions of each vessel. Bilateral testing is considered an integral part of a complete examination. Limited examinations for reoccurring indications may be performed as noted. The reflux portion of the exam is performed with the patient in reverse Trendelenburg.  +---------+---------------+---------+-----------+----------+--------------+ RIGHT    CompressibilityPhasicitySpontaneityPropertiesThrombus Aging +---------+---------------+---------+-----------+----------+--------------+ CFV      Full           Yes      Yes                                 +---------+---------------+---------+-----------+----------+--------------+ SFJ      Full                                                        +---------+---------------+---------+-----------+----------+--------------+ FV Prox  Full           Yes      Yes                                  +---------+---------------+---------+-----------+----------+--------------+ FV Mid   Full           Yes      Yes                                 +---------+---------------+---------+-----------+----------+--------------+ FV DistalFull           Yes      Yes                                 +---------+---------------+---------+-----------+----------+--------------+  PFV      Full                                                        +---------+---------------+---------+-----------+----------+--------------+ POP      Full           Yes      Yes                                 +---------+---------------+---------+-----------+----------+--------------+ PTV      Full                                                        +---------+---------------+---------+-----------+----------+--------------+ PERO     Full                                                        +---------+---------------+---------+-----------+----------+--------------+   +----+---------------+---------+-----------+----------+--------------+ LEFTCompressibilityPhasicitySpontaneityPropertiesThrombus Aging +----+---------------+---------+-----------+----------+--------------+ CFV Full           Yes      Yes                                 +----+---------------+---------+-----------+----------+--------------+     Summary: RIGHT: - There is no evidence of deep vein thrombosis in the lower extremity.  - No cystic structure found in the popliteal fossa. - Ultrasound characteristics of enlarged lymph nodes are noted in the groin.  LEFT: - No evidence of common femoral vein obstruction.   *See table(s) above for measurements and observations. Electronically signed by Waverly Ferrari MD on 02/27/2023 at 7:20:57 PM.    Final    VAS [place] LOWER EXTREMITY VENOUS (DVT) (ONLY [facility] & [facility])  Result Date: 02/21/2023  Lower Venous DVT Study Patient Name:  KARSTYN KIE  Date of Exam:   02/20/2023 Medical Rec #:  [id number]        Accession #:    [id number] Date of Birth: [birth year]        Patient Gender: F Patient Age:   71 years Exam Location:  [facility] Procedure:      VAS [place] LOWER EXTREMITY VENOUS (DVT) Referring Phys: MAD[facility] --------------------------------------------------------------------------------  Indications: Pain, Swelling, and Erythema.  Limitations: Edema of calf. Comparison Study: No prior study on file Performing Technologist: Sherren Kerns RVS  Examination Guidelines: A complete evaluation includes B-mode imaging, spectral Doppler, color Doppler, and power Doppler as needed of all accessible portions of each vessel. Bilateral testing is considered an integral part of a complete examination. Limited examinations for reoccurring indications may be performed as noted. The reflux portion of the exam is performed with the patient in reverse Trendelenburg.  +---------+---------------+---------+-----------+----------+-------------------+ RIGHT    CompressibilityPhasicitySpontaneityPropertiesThrombus Aging      +---------+---------------+---------+-----------+----------+-------------------+ CFV      Full           Yes      Yes                                      +---------+---------------+---------+-----------+----------+-------------------+  SFJ      Full                                                             +---------+---------------+---------+-----------+----------+-------------------+ FV Prox  Full                                                             +---------+---------------+---------+-----------+----------+-------------------+ FV Mid   Full                                                             +---------+---------------+---------+-----------+----------+-------------------+ FV DistalFull                                                             +---------+---------------+---------+-----------+----------+-------------------+  PFV      Full                                                             +---------+---------------+---------+-----------+----------+-------------------+ POP      Full           Yes      Yes                                      +---------+---------------+---------+-----------+----------+-------------------+ PTV                                                   patent by color     +---------+---------------+---------+-----------+----------+-------------------+ PERO                                                  Not well visualized +---------+---------------+---------+-----------+----------+-------------------+   +----+---------------+---------+-----------+----------+--------------+ LEFTCompressibilityPhasicitySpontaneityPropertiesThrombus Aging +----+---------------+---------+-----------+----------+--------------+ CFV Full           Yes      Yes                                 +----+---------------+---------+-----------+----------+--------------+    Summary: RIGHT: - There is no evidence of deep vein thrombosis in the lower extremity.  - No cystic structure found in the popliteal fossa. - Ultrasound characteristics of enlarged lymph nodes are  noted in the groin.  LEFT: - No evidence of common femoral vein obstruction.   *See table(s) above for measurements and observations. Electronically signed by Waverly Ferrari MD on 02/21/2023 at 5:56:56 AM.    Final    DG Tibia/Fibula Right  Result Date: 02/20/2023 CLINICAL DATA:  Right lower leg pain, redness, and swelling for a few days. EXAM: RIGHT TIBIA AND FIBULA - 2 VIEW COMPARISON:  Right foot 712 21 FINDINGS: Degenerative changes in the right knee with medial compartment narrowing and small osteophyte formation. No evidence of acute fracture or dislocation in the tibia and fibula. No focal bone lesion or bone destruction. Scattered soft tissue calcifications likely representing phleboliths. No radiopaque soft tissue  foreign bodies or soft tissue gas. IMPRESSION: Degenerative changes in the right knee. No acute bony abnormalities. Electronically Signed   By: Burman Nieves M.D.   On: 02/20/2023 19:02    Microbiology: Results for orders placed or performed during the hospital encounter of 02/26/23  MRSA Next Gen by PCR, Nasal     Status: None   Collection Time: 02/26/23 11:10 PM   Specimen: Nasal Mucosa; Nasal Swab  Result Value Ref Range Status   MRSA by PCR Next Gen NOT DETECTED NOT DETECTED Final    Comment: (NOTE) The GeneXpert MRSA Assay (FDA approved for NASAL specimens only), is one component of a comprehensive MRSA colonization surveillance program. It is not intended to diagnose MRSA infection nor to guide or monitor treatment for MRSA infections. Test performance is not FDA approved in patients less than 57 years old. Performed at [facility], [address] [place], [place] [address]    Labs: CBC: Recent Labs  Lab 02/26/23 2045 02/27/23 0909 02/28/23 0948 03/01/23 0901  WBC 5.0 2.9* 2.2* 2.0*  NEUTROABS 3.5 1.6* 1.2* 1.0*  HGB 14.1 12.9 12.7 13.6  HCT 42.8 39.0 39.1 42.1  MCV 101.7* 102.4* 102.1* 104.2*  PLT 108* 99* 100* 102*   Basic Metabolic Panel: Recent Labs  Lab 02/26/23 1804 02/27/23 0356 02/28/23 0948 03/01/23 0901  NA 137 139 138 142  K 5.1 3.7 3.9 4.0  CL 104 109 105 106  CO2 25 24 24 28   GLUCOSE 123* 92 124* 92  BUN 10 9 10  7*  CREATININE 0.70 0.53 0.70 0.71  CALCIUM 8.7* 8.4* 8.5* 8.6*  MG  --   --  2.0 1.9  PHOS  --   --  2.4* 2.6   Liver Function Tests: Recent Labs  Lab 02/27/23 0356 02/28/23 0948 03/01/23 0901  AST 26 27 27   ALT 14 15 16   ALKPHOS 47 52 60  BILITOT 1.6* 1.2 1.0  PROT 6.7 7.2 7.5  ALBUMIN 2.8* 3.0* 3.1*   CBG: No results for input(s): "GLUCAP" in the last 168 hours.  Discharge time spent: greater than 30 minutes.  Signed: Marguerita Merles, DO [facility] 03/03/2023

## 2023-03-01 NOTE — Progress Notes (Signed)
Discharge instructions given to pt. Verbalizes understanding.

## 2023-03-01 NOTE — Care Management Important Message (Signed)
iImportant Message  Patient Details IM Letter given. Name: Ruth Stephenson MRN: [id number] Date of Birth: Mar 30, 1951   Medicare Important Message Given:  Yes     Caren Macadam 03/01/2023, 10:19 AM

## 2023-03-05 ENCOUNTER — Telehealth: Payer: Self-pay

## 2023-03-05 DIAGNOSIS — F1120 Opioid dependence, uncomplicated: Secondary | ICD-10-CM | POA: Diagnosis not present

## 2023-03-05 NOTE — Transitions of Care (Post Inpatient/ED Visit) (Signed)
03/05/2023  Name: Ruth Stephenson MRN: [id number] DOB: [birth year]  Today's TOC FU Call Status: Today's TOC FU Call Status:: Successful TOC FU Call Completed TOC FU Call Complete Date: 03/05/23 Patient's Name and Date of Birth confirmed.  Transition Care Management Follow-up Telephone Call Date of Discharge: 03/01/23 Discharge Facility: [facility]) Type of Discharge: Inpatient Admission Primary Inpatient Discharge Diagnosis:: cellulitis How have you been since you were released from the hospital?: Better Any questions or concerns?: No  Items Reviewed: Did you receive and understand the discharge instructions provided?: Yes Medications obtained,verified, and reconciled?: Yes (Medications Reviewed) Any new allergies since your discharge?: No Dietary orders reviewed?: Yes Type of Diet Ordered:: regular diet Do you have support at home?: Yes People in Home: child(ren), adult Name of Support/Comfort Primary Source: son  Medications Reviewed Today: Medications Reviewed Today     Reviewed by Earlie Server, RN (Registered Nurse) on 03/05/23 at 1659  Med List Status: <None>   Medication Order Taking? Sig Documenting Provider Last Dose Status Informant  acetaminophen (TYLENOL) 325 MG tablet [id number]  Take 2 tablets (650 mg total) by mouth every 6 (six) hours as needed for mild pain (or Fever >/= 101). Sheikh, Omair [place], [place]  Active   amoxicillin-clavulanate (AUGMENTIN) 875-125 MG tablet [id number]  Take 1 tablet by mouth 2 (two) times daily for 7 days. Sheikh, Omair [place], DO  Active   bisacodyl (DULCOLAX) 5 MG EC tablet [id number]  Take 2 tablets (10 mg total) by mouth daily. Marguerita Merles [place], [place]  Active   DOVATO 50-300 MG tablet [id number] No TAKE 1 TABLET BY MOUTH EVERY DAY [name], MD 02/26/2023 Active Self  doxycycline (DORYX) 100 MG EC tablet [id number]  Take 1 tablet (100 mg total) by mouth 2 (two) times daily for 7 days. Marguerita Merles [place], [place]  Active   GINKGO  BILOBA PLUS PO [id number] No Take 1 capsule by mouth daily. [provider] 02/26/2023 Active Self  methadone (DOLOPHINE) 10 MG/5ML solution [id number] No Take 23 mg by mouth in the morning. [provider] 02/26/2023 Active Self           Med Note Antony Madura, Arn Medal   Tue Feb 20, 2023  7:19 PM) Per Ms. Cheree Ditto at [place] (she called me back) the patient was last "in clinic" on 02/12/2023 and was given 20 "take home" doses. She is due back in the clinic on 02/27/2023. 23 mg IS correct.  ondansetron (ZOFRAN) 4 MG tablet [id number]  Take 1 tablet (4 mg total) by mouth every 6 (six) hours as needed for nausea. Marguerita Merles [place], DO  Active             Home Care and Equipment/Supplies: Were Home Health Services Ordered?: No Any new equipment or medical supplies ordered?: No  Functional Questionnaire: Do you need assistance with bathing/showering or dressing?: No Do you need assistance with meal preparation?: No Do you need assistance with eating?: No Do you have difficulty maintaining continence: No Do you need assistance with getting out of bed/getting out of a chair/moving?: No Do you have difficulty managing or taking your medications?: No  Follow up appointments reviewed: PCP Follow-up appointment confirmed?: No (wants a follow up appointment with PCP) Specialist Hospital Follow-up appointment confirmed?: No Reason Specialist Follow-Up Not Confirmed: Appointment Sceduled by [facility] Calling Clinician Do you need transportation to your follow-up appointment?: No  Message sent to [facility] to arrange hospital follow up for patient. Patient request afternoon appointment.  SDOH Interventions Today    Flowsheet Row Most Recent Value  SDOH Interventions   Food Insecurity Interventions Intervention Not Indicated  Transportation Interventions Intervention Not Indicated      Interventions Today    Flowsheet Row Most Recent Value  Chronic Disease   Chronic disease during  today's visit Other  [cellulitis]  General Interventions   General Interventions Discussed/Reviewed General Interventions Discussed  Pharmacy Interventions   Pharmacy Dicussed/Reviewed Medications and their functions        Rowe Pavy, Charity fundraiser, Scientist, research (physical sciences), CEN [facility] [employer] [phone]

## 2023-03-08 ENCOUNTER — Other Ambulatory Visit: Payer: Medicare HMO

## 2023-03-14 ENCOUNTER — Encounter: Payer: Medicare HMO | Admitting: Infectious Diseases

## 2023-03-15 ENCOUNTER — Other Ambulatory Visit: Payer: Self-pay | Admitting: Infectious Diseases

## 2023-03-15 ENCOUNTER — Ambulatory Visit
Admission: RE | Admit: 2023-03-15 | Discharge: 2023-03-15 | Disposition: A | Discharge status: Home / Self Care | Payer: Medicare HMO | Source: Ambulatory Visit | Attending: Infectious Diseases | Admitting: Infectious Diseases

## 2023-03-15 DIAGNOSIS — R928 Other abnormal and inconclusive findings on diagnostic imaging of breast: Secondary | ICD-10-CM

## 2023-03-15 DIAGNOSIS — N6321 Unspecified lump in the left breast, upper outer quadrant: Secondary | ICD-10-CM | POA: Diagnosis not present

## 2023-03-15 DIAGNOSIS — N6320 Unspecified lump in the left breast, unspecified quadrant: Secondary | ICD-10-CM

## 2023-03-15 DIAGNOSIS — R590 Localized enlarged lymph nodes: Secondary | ICD-10-CM | POA: Diagnosis not present

## 2023-03-20 ENCOUNTER — Encounter: Payer: Self-pay | Admitting: Infectious Diseases

## 2023-03-20 ENCOUNTER — Inpatient Hospital Stay
Admission: RE | Admit: 2023-03-20 | Discharge: 2023-03-20 | Disposition: A | Discharge status: Home / Self Care | Payer: Medicare HMO | Source: Ambulatory Visit | Attending: Infectious Diseases | Admitting: Infectious Diseases

## 2023-03-20 ENCOUNTER — Other Ambulatory Visit: Payer: Medicare HMO

## 2023-03-22 ENCOUNTER — Ambulatory Visit
Admission: RE | Admit: 2023-03-22 | Discharge: 2023-03-22 | Disposition: A | Discharge status: Home / Self Care | Payer: Medicare HMO | Source: Ambulatory Visit | Attending: Infectious Diseases | Admitting: Infectious Diseases

## 2023-03-22 DIAGNOSIS — C50412 Malignant neoplasm of upper-outer quadrant of left female breast: Secondary | ICD-10-CM | POA: Diagnosis not present

## 2023-03-22 DIAGNOSIS — R928 Other abnormal and inconclusive findings on diagnostic imaging of breast: Secondary | ICD-10-CM

## 2023-03-22 DIAGNOSIS — N6321 Unspecified lump in the left breast, upper outer quadrant: Secondary | ICD-10-CM | POA: Diagnosis not present

## 2023-03-22 HISTORY — PX: BREAST BIOPSY: SHX20

## 2023-03-23 ENCOUNTER — Telehealth: Payer: Self-pay | Admitting: Hematology and Oncology

## 2023-03-23 LAB — SURGICAL PATHOLOGY

## 2023-03-23 NOTE — Telephone Encounter (Signed)
Spoke to patient to confirm upcoming morning [facility] clinic appointment on 10/2, paperwork will be sent via e-mail.   Gave location and time, also informed patient that the surgeon's office would be calling as well to get information from them similar to the packet that they will be receiving so make sure to do both.  Reminded patient that all providers will be coming to the clinic to see them HERE and if they had any questions to not hesitate to reach back out to myself or their navigators.

## 2023-03-26 ENCOUNTER — Encounter: Payer: Self-pay | Admitting: *Deleted

## 2023-03-26 DIAGNOSIS — F1120 Opioid dependence, uncomplicated: Secondary | ICD-10-CM | POA: Diagnosis not present

## 2023-03-27 ENCOUNTER — Encounter: Payer: Self-pay | Admitting: *Deleted

## 2023-03-27 DIAGNOSIS — C50412 Malignant neoplasm of upper-outer quadrant of left female breast: Secondary | ICD-10-CM | POA: Insufficient documentation

## 2023-03-27 DIAGNOSIS — Z170 Estrogen receptor positive status [ER+]: Secondary | ICD-10-CM

## 2023-03-27 NOTE — Progress Notes (Signed)
Full           Yes      Yes                                 +----+---------------+---------+-----------+----------+--------------+     Summary: RIGHT: - There is no evidence of deep vein thrombosis in the lower extremity.  - No cystic structure found in the popliteal fossa. - Ultrasound characteristics of enlarged lymph nodes are noted in the groin.  LEFT: - No evidence of common femoral vein obstruction.   *See table(s) above for measurements and observations. Electronically signed by Waverly Ferrari MD on 02/27/2023 at 7:20:57 PM.    Final    VAS [place] LOWER EXTREMITY VENOUS (DVT) (ONLY [facility])  Result Date: 02/21/2023  Lower Venous DVT Study Patient Name:  Ruth Stephenson  Date of Exam:   02/20/2023 Medical Rec #: [id number]        Accession #:    [id number] Date of Birth: March 06, 1951        Patient Gender: F Patient Age:   72 years Exam Location:  [facility] Procedure:      VAS [place] LOWER EXTREMITY VENOUS (DVT) Referring Phys: [facility] --------------------------------------------------------------------------------  Indications: Pain, Swelling, and Erythema.  Limitations: Edema of calf. Comparison Study: No prior  study on file Performing Technologist: Sherren Kerns RVS  Examination Guidelines: A complete evaluation includes B-mode imaging, spectral Doppler, color Doppler, and power Doppler as needed of all accessible portions of each vessel. Bilateral testing is considered an integral part of a complete examination. Limited examinations for reoccurring indications may be performed as noted. The reflux portion of the exam is performed with the patient in reverse Trendelenburg.  +---------+---------------+---------+-----------+----------+-------------------+ RIGHT    CompressibilityPhasicitySpontaneityPropertiesThrombus Aging      +---------+---------------+---------+-----------+----------+-------------------+ CFV      Full           Yes      Yes                                      +---------+---------------+---------+-----------+----------+-------------------+ SFJ      Full                                                             +---------+---------------+---------+-----------+----------+-------------------+ FV Prox  Full                                                             +---------+---------------+---------+-----------+----------+-------------------+ FV Mid   Full                                                             +---------+---------------+---------+-----------+----------+-------------------+ FV DistalFull                                                             +---------+---------------+---------+-----------+----------+-------------------+  Full           Yes      Yes                                 +----+---------------+---------+-----------+----------+--------------+     Summary: RIGHT: - There is no evidence of deep vein thrombosis in the lower extremity.  - No cystic structure found in the popliteal fossa. - Ultrasound characteristics of enlarged lymph nodes are noted in the groin.  LEFT: - No evidence of common femoral vein obstruction.   *See table(s) above for measurements and observations. Electronically signed by Waverly Ferrari MD on 02/27/2023 at 7:20:57 PM.    Final    VAS [place] LOWER EXTREMITY VENOUS (DVT) (ONLY [facility])  Result Date: 02/21/2023  Lower Venous DVT Study Patient Name:  Ruth Stephenson  Date of Exam:   02/20/2023 Medical Rec #: [id number]        Accession #:    [id number] Date of Birth: March 06, 1951        Patient Gender: F Patient Age:   72 years Exam Location:  [facility] Procedure:      VAS [place] LOWER EXTREMITY VENOUS (DVT) Referring Phys: [facility] --------------------------------------------------------------------------------  Indications: Pain, Swelling, and Erythema.  Limitations: Edema of calf. Comparison Study: No prior  study on file Performing Technologist: Sherren Kerns RVS  Examination Guidelines: A complete evaluation includes B-mode imaging, spectral Doppler, color Doppler, and power Doppler as needed of all accessible portions of each vessel. Bilateral testing is considered an integral part of a complete examination. Limited examinations for reoccurring indications may be performed as noted. The reflux portion of the exam is performed with the patient in reverse Trendelenburg.  +---------+---------------+---------+-----------+----------+-------------------+ RIGHT    CompressibilityPhasicitySpontaneityPropertiesThrombus Aging      +---------+---------------+---------+-----------+----------+-------------------+ CFV      Full           Yes      Yes                                      +---------+---------------+---------+-----------+----------+-------------------+ SFJ      Full                                                             +---------+---------------+---------+-----------+----------+-------------------+ FV Prox  Full                                                             +---------+---------------+---------+-----------+----------+-------------------+ FV Mid   Full                                                             +---------+---------------+---------+-----------+----------+-------------------+ FV DistalFull                                                             +---------+---------------+---------+-----------+----------+-------------------+  Radiation Oncology         [phone] ________________________________  Name: Ruth Stephenson        MRN: [id number]  Date of Service: 03/28/2023 DOB: 1951/06/08  CC:Ruth Smart, MD  Almond Lint, MD     REFERRING PHYSICIAN: Almond Lint, MD   DIAGNOSIS: The encounter diagnosis was Malignant neoplasm of upper-outer quadrant of left breast in female, estrogen receptor positive (HCC).   HISTORY OF PRESENT ILLNESS: Ruth Stephenson is a 72 y.o. female seen in the multidisciplinary breast clinic for a new diagnosis of left breast cancer. The patient was noted to have a palpable mass in the left breast and diagnostic mammogram on 03/15/2023 showed a spiculated mass in the upper outer left breast.  No abnormalities were noted in the right breast.  By ultrasound the mass was in the 2 o'clock position of the left breast measuring 1.5 cm in greatest dimension with a deep left axillary lymph node bordering on concern with cortical thickening of 3.3 mm no other abnormalities were appreciated.  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  The cancer was ER/PR positive, HER2 was negative with a Ki-67 of 5%.  Of note at the time of her biopsy there was no definitive morphologically abnormal lymph nodes so a biopsy was not performed in this area.  She is seen to discuss treatment recommendations of her cancer.    PREVIOUS RADIATION THERAPY: No   PAST MEDICAL HISTORY:  Past Medical History:  Diagnosis Date   Angio-edema    Cancer (HCC)    Cancer of right colon (HCC) 10/24/2012   Discussed with h/o- no need for further f/u (08-2020)     Hepatitis C    History of COVID-19 06/03/2020   HIV (human immunodeficiency virus infection) (HCC)    Polysubstance abuse (HCC)        PAST SURGICAL HISTORY: Past Surgical History:  Procedure Laterality Date    BREAST BIOPSY Left 03/22/2023   [place] LT BREAST BX W LOC DEV 1ST LESION IMG BX SPEC US GUIDE 03/22/2023 [facility] MAMMOGRAPHY   CARPAL TUNNEL RELEASE Left 01/19/2014   Procedure: LEFT CARPAL TUNNEL RELEASE;  Surgeon: Marlowe Shores, MD;  Location: [facility];  Service: Orthopedics;  Laterality: Left;   COLON RESECTION N/A 09/23/2012   Procedure: LAPAROSCOPIC RIGHT COLON RESECTION   ;  Surgeon: Mariella Saa, MD;  Location: [facility];  Service: General;  Laterality: N/A;  LAPOROSCOPY, RIGHT HEMICOLECTOMY   COLON SURGERY  09/23/2012   lap right colectomy   COLONOSCOPY N/A 09/20/2012   Procedure: COLONOSCOPY;  Surgeon: Theda Belfast, MD;  Location: [facility];  Service: Endoscopy;  Laterality: N/A;   OPEN REDUCTION INTERNAL FIXATION (ORIF) DISTAL RADIAL FRACTURE Left 01/19/2014   Procedure: OPEN REDUCTION INTERNAL FIXATION (ORIF) LEFT  DISTAL RADIAL FRACTURE;  Surgeon: Marlowe Shores, MD;  Location: [facility];  Service: Orthopedics;  Laterality: Left;     FAMILY HISTORY:  Family History  Problem Relation Age of Onset   Angioedema Mother    Cancer Mother    Asthma Brother    Angioedema Maternal Grandmother      SOCIAL HISTORY:  reports that she has quit smoking. Her smoking use included cigarettes. She has a 17.5 pack-year smoking history. She has been exposed to tobacco smoke. She has quit using smokeless tobacco. She reports that  Full           Yes      Yes                                 +----+---------------+---------+-----------+----------+--------------+     Summary: RIGHT: - There is no evidence of deep vein thrombosis in the lower extremity.  - No cystic structure found in the popliteal fossa. - Ultrasound characteristics of enlarged lymph nodes are noted in the groin.  LEFT: - No evidence of common femoral vein obstruction.   *See table(s) above for measurements and observations. Electronically signed by Waverly Ferrari MD on 02/27/2023 at 7:20:57 PM.    Final    VAS [place] LOWER EXTREMITY VENOUS (DVT) (ONLY [facility])  Result Date: 02/21/2023  Lower Venous DVT Study Patient Name:  Ruth Stephenson  Date of Exam:   02/20/2023 Medical Rec #: [id number]        Accession #:    [id number] Date of Birth: March 06, 1951        Patient Gender: F Patient Age:   72 years Exam Location:  [facility] Procedure:      VAS [place] LOWER EXTREMITY VENOUS (DVT) Referring Phys: [facility] --------------------------------------------------------------------------------  Indications: Pain, Swelling, and Erythema.  Limitations: Edema of calf. Comparison Study: No prior  study on file Performing Technologist: Sherren Kerns RVS  Examination Guidelines: A complete evaluation includes B-mode imaging, spectral Doppler, color Doppler, and power Doppler as needed of all accessible portions of each vessel. Bilateral testing is considered an integral part of a complete examination. Limited examinations for reoccurring indications may be performed as noted. The reflux portion of the exam is performed with the patient in reverse Trendelenburg.  +---------+---------------+---------+-----------+----------+-------------------+ RIGHT    CompressibilityPhasicitySpontaneityPropertiesThrombus Aging      +---------+---------------+---------+-----------+----------+-------------------+ CFV      Full           Yes      Yes                                      +---------+---------------+---------+-----------+----------+-------------------+ SFJ      Full                                                             +---------+---------------+---------+-----------+----------+-------------------+ FV Prox  Full                                                             +---------+---------------+---------+-----------+----------+-------------------+ FV Mid   Full                                                             +---------+---------------+---------+-----------+----------+-------------------+ FV DistalFull                                                             +---------+---------------+---------+-----------+----------+-------------------+  Full           Yes      Yes                                 +----+---------------+---------+-----------+----------+--------------+     Summary: RIGHT: - There is no evidence of deep vein thrombosis in the lower extremity.  - No cystic structure found in the popliteal fossa. - Ultrasound characteristics of enlarged lymph nodes are noted in the groin.  LEFT: - No evidence of common femoral vein obstruction.   *See table(s) above for measurements and observations. Electronically signed by Waverly Ferrari MD on 02/27/2023 at 7:20:57 PM.    Final    VAS [place] LOWER EXTREMITY VENOUS (DVT) (ONLY [facility])  Result Date: 02/21/2023  Lower Venous DVT Study Patient Name:  Ruth Stephenson  Date of Exam:   02/20/2023 Medical Rec #: [id number]        Accession #:    [id number] Date of Birth: March 06, 1951        Patient Gender: F Patient Age:   72 years Exam Location:  [facility] Procedure:      VAS [place] LOWER EXTREMITY VENOUS (DVT) Referring Phys: [facility] --------------------------------------------------------------------------------  Indications: Pain, Swelling, and Erythema.  Limitations: Edema of calf. Comparison Study: No prior  study on file Performing Technologist: Sherren Kerns RVS  Examination Guidelines: A complete evaluation includes B-mode imaging, spectral Doppler, color Doppler, and power Doppler as needed of all accessible portions of each vessel. Bilateral testing is considered an integral part of a complete examination. Limited examinations for reoccurring indications may be performed as noted. The reflux portion of the exam is performed with the patient in reverse Trendelenburg.  +---------+---------------+---------+-----------+----------+-------------------+ RIGHT    CompressibilityPhasicitySpontaneityPropertiesThrombus Aging      +---------+---------------+---------+-----------+----------+-------------------+ CFV      Full           Yes      Yes                                      +---------+---------------+---------+-----------+----------+-------------------+ SFJ      Full                                                             +---------+---------------+---------+-----------+----------+-------------------+ FV Prox  Full                                                             +---------+---------------+---------+-----------+----------+-------------------+ FV Mid   Full                                                             +---------+---------------+---------+-----------+----------+-------------------+ FV DistalFull                                                             +---------+---------------+---------+-----------+----------+-------------------+  Full           Yes      Yes                                 +----+---------------+---------+-----------+----------+--------------+     Summary: RIGHT: - There is no evidence of deep vein thrombosis in the lower extremity.  - No cystic structure found in the popliteal fossa. - Ultrasound characteristics of enlarged lymph nodes are noted in the groin.  LEFT: - No evidence of common femoral vein obstruction.   *See table(s) above for measurements and observations. Electronically signed by Waverly Ferrari MD on 02/27/2023 at 7:20:57 PM.    Final    VAS [place] LOWER EXTREMITY VENOUS (DVT) (ONLY [facility])  Result Date: 02/21/2023  Lower Venous DVT Study Patient Name:  Ruth Stephenson  Date of Exam:   02/20/2023 Medical Rec #: [id number]        Accession #:    [id number] Date of Birth: March 06, 1951        Patient Gender: F Patient Age:   72 years Exam Location:  [facility] Procedure:      VAS [place] LOWER EXTREMITY VENOUS (DVT) Referring Phys: [facility] --------------------------------------------------------------------------------  Indications: Pain, Swelling, and Erythema.  Limitations: Edema of calf. Comparison Study: No prior  study on file Performing Technologist: Sherren Kerns RVS  Examination Guidelines: A complete evaluation includes B-mode imaging, spectral Doppler, color Doppler, and power Doppler as needed of all accessible portions of each vessel. Bilateral testing is considered an integral part of a complete examination. Limited examinations for reoccurring indications may be performed as noted. The reflux portion of the exam is performed with the patient in reverse Trendelenburg.  +---------+---------------+---------+-----------+----------+-------------------+ RIGHT    CompressibilityPhasicitySpontaneityPropertiesThrombus Aging      +---------+---------------+---------+-----------+----------+-------------------+ CFV      Full           Yes      Yes                                      +---------+---------------+---------+-----------+----------+-------------------+ SFJ      Full                                                             +---------+---------------+---------+-----------+----------+-------------------+ FV Prox  Full                                                             +---------+---------------+---------+-----------+----------+-------------------+ FV Mid   Full                                                             +---------+---------------+---------+-----------+----------+-------------------+ FV DistalFull                                                             +---------+---------------+---------+-----------+----------+-------------------+  Radiation Oncology         [phone] ________________________________  Name: Ruth Stephenson        MRN: [id number]  Date of Service: 03/28/2023 DOB: 1951/06/08  CC:Ruth Smart, MD  Almond Lint, MD     REFERRING PHYSICIAN: Almond Lint, MD   DIAGNOSIS: The encounter diagnosis was Malignant neoplasm of upper-outer quadrant of left breast in female, estrogen receptor positive (HCC).   HISTORY OF PRESENT ILLNESS: Ruth Stephenson is a 72 y.o. female seen in the multidisciplinary breast clinic for a new diagnosis of left breast cancer. The patient was noted to have a palpable mass in the left breast and diagnostic mammogram on 03/15/2023 showed a spiculated mass in the upper outer left breast.  No abnormalities were noted in the right breast.  By ultrasound the mass was in the 2 o'clock position of the left breast measuring 1.5 cm in greatest dimension with a deep left axillary lymph node bordering on concern with cortical thickening of 3.3 mm no other abnormalities were appreciated.  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  The cancer was ER/PR positive, HER2 was negative with a Ki-67 of 5%.  Of note at the time of her biopsy there was no definitive morphologically abnormal lymph nodes so a biopsy was not performed in this area.  She is seen to discuss treatment recommendations of her cancer.    PREVIOUS RADIATION THERAPY: No   PAST MEDICAL HISTORY:  Past Medical History:  Diagnosis Date   Angio-edema    Cancer (HCC)    Cancer of right colon (HCC) 10/24/2012   Discussed with h/o- no need for further f/u (08-2020)     Hepatitis C    History of COVID-19 06/03/2020   HIV (human immunodeficiency virus infection) (HCC)    Polysubstance abuse (HCC)        PAST SURGICAL HISTORY: Past Surgical History:  Procedure Laterality Date    BREAST BIOPSY Left 03/22/2023   [place] LT BREAST BX W LOC DEV 1ST LESION IMG BX SPEC US GUIDE 03/22/2023 [facility] MAMMOGRAPHY   CARPAL TUNNEL RELEASE Left 01/19/2014   Procedure: LEFT CARPAL TUNNEL RELEASE;  Surgeon: Marlowe Shores, MD;  Location: [facility];  Service: Orthopedics;  Laterality: Left;   COLON RESECTION N/A 09/23/2012   Procedure: LAPAROSCOPIC RIGHT COLON RESECTION   ;  Surgeon: Mariella Saa, MD;  Location: [facility];  Service: General;  Laterality: N/A;  LAPOROSCOPY, RIGHT HEMICOLECTOMY   COLON SURGERY  09/23/2012   lap right colectomy   COLONOSCOPY N/A 09/20/2012   Procedure: COLONOSCOPY;  Surgeon: Theda Belfast, MD;  Location: [facility];  Service: Endoscopy;  Laterality: N/A;   OPEN REDUCTION INTERNAL FIXATION (ORIF) DISTAL RADIAL FRACTURE Left 01/19/2014   Procedure: OPEN REDUCTION INTERNAL FIXATION (ORIF) LEFT  DISTAL RADIAL FRACTURE;  Surgeon: Marlowe Shores, MD;  Location: [facility];  Service: Orthopedics;  Laterality: Left;     FAMILY HISTORY:  Family History  Problem Relation Age of Onset   Angioedema Mother    Cancer Mother    Asthma Brother    Angioedema Maternal Grandmother      SOCIAL HISTORY:  reports that she has quit smoking. Her smoking use included cigarettes. She has a 17.5 pack-year smoking history. She has been exposed to tobacco smoke. She has quit using smokeless tobacco. She reports that  views of both breasts, magnification views of the RIGHT breast and spot compression view of the LEFT breast are performed. A spiculated mass within the UPPER-OUTER LEFT breast is identified. No other suspicious abnormalities within either breast noted. Targeted ultrasound is performed, showing a 1.5 x 1.3 x 1 cm irregular hypoechoic mass at the 2 o'clock position of the LEFT breast 7 cm from the nipple corresponding to the mammographic mass. A deep LEFT axillary lymph node with borderline cortical thickening of 3.3 mm is noted. No other abnormal appearing LEFT axillary lymph nodes are noted. IMPRESSION: 1. Highly suspicious 1.5 cm UPPER-OUTER LEFT breast mass. Tissue sampling is recommended. 2. Deep LEFT axillary lymph node with borderline cortical thickening. Attempt at tissue sampling is recommended. 3. No other significant abnormalities. RECOMMENDATION: Ultrasound-guided LEFT breast biopsy and attempt at ultrasound-guided LEFT axillary lymph node biopsy, which will be scheduled. I have discussed the findings and recommendations with the patient. If applicable, a reminder letter will be sent to the patient regarding the next appointment. BI-RADS CATEGORY  5: Highly suggestive of malignancy. Electronically Signed   By: Harmon Pier M.D.   On: 03/15/2023 15:15   [place] LIMITED ULTRASOUND INCLUDING AXILLA LEFT BREAST   Result Date: 03/15/2023 CLINICAL DATA:  72 year old  female with palpable LEFT breast mass identified on self-examination. Baseline mammogram. EXAM: DIGITAL DIAGNOSTIC BILATERAL MAMMOGRAM WITH TOMOSYNTHESIS AND CAD; ULTRASOUND LEFT BREAST LIMITED TECHNIQUE: Bilateral digital diagnostic mammography and breast tomosynthesis was performed. The images were evaluated with computer-aided detection. ; Targeted ultrasound examination of the left breast was performed. COMPARISON:  None available. ACR Breast Density Category b: There are scattered areas of fibroglandular density. FINDINGS: Full field views of both breasts, magnification views of the RIGHT breast and spot compression view of the LEFT breast are performed. A spiculated mass within the UPPER-OUTER LEFT breast is identified. No other suspicious abnormalities within either breast noted. Targeted ultrasound is performed, showing a 1.5 x 1.3 x 1 cm irregular hypoechoic mass at the 2 o'clock position of the LEFT breast 7 cm from the nipple corresponding to the mammographic mass. A deep LEFT axillary lymph node with borderline cortical thickening of 3.3 mm is noted. No other abnormal appearing LEFT axillary lymph nodes are noted. IMPRESSION: 1. Highly suspicious 1.5 cm UPPER-OUTER LEFT breast mass. Tissue sampling is recommended. 2. Deep LEFT axillary lymph node with borderline cortical thickening. Attempt at tissue sampling is recommended. 3. No other significant abnormalities. RECOMMENDATION: Ultrasound-guided LEFT breast biopsy and attempt at ultrasound-guided LEFT axillary lymph node biopsy, which will be scheduled. I have discussed the findings and recommendations with the patient. If applicable, a reminder letter will be sent to the patient regarding the next appointment. BI-RADS CATEGORY  5: Highly suggestive of malignancy. Electronically Signed   By: Harmon Pier M.D.   On: 03/15/2023 15:15   VAS [place] LOWER EXTREMITY VENOUS (DVT)  Result Date: 02/27/2023  Lower Venous DVT Study Patient Name:  Ruth Stephenson   Date of Exam:   02/27/2023 Medical Rec #: [id number]        Accession #:    [id number] Date of Birth: January 06, 1951        Patient Gender: F Patient Age:   75 years Exam Location:  [facility] Procedure:      VAS [place] LOWER EXTREMITY VENOUS (DVT) Referring Phys: ERIC CHEN --------------------------------------------------------------------------------  Indications: Pain, Swelling, and Erythema.  Comparison Study: Previous exam on 02/20/2023 was negative for DVT Performing Technologist: Ernestene Mention RVT, RDMS  Examination Guidelines:

## 2023-03-28 ENCOUNTER — Inpatient Hospital Stay: Payer: Medicare HMO | Attending: Hematology and Oncology

## 2023-03-28 ENCOUNTER — Ambulatory Visit: Payer: Medicare HMO | Admitting: Physical Therapy

## 2023-03-28 ENCOUNTER — Encounter: Payer: Self-pay | Admitting: Genetic Counselor

## 2023-03-28 ENCOUNTER — Ambulatory Visit
Admission: RE | Admit: 2023-03-28 | Discharge: 2023-03-28 | Disposition: A | Discharge status: Home / Self Care | Payer: Medicare HMO | Source: Ambulatory Visit | Attending: Radiation Oncology | Admitting: Radiation Oncology

## 2023-03-28 ENCOUNTER — Other Ambulatory Visit: Payer: Self-pay | Admitting: General Surgery

## 2023-03-28 ENCOUNTER — Inpatient Hospital Stay (HOSPITAL_BASED_OUTPATIENT_CLINIC_OR_DEPARTMENT_OTHER): Payer: Medicare HMO | Admitting: Hematology and Oncology

## 2023-03-28 ENCOUNTER — Inpatient Hospital Stay: Payer: Medicare HMO | Admitting: Genetic Counselor

## 2023-03-28 VITALS — BP 146/66 | HR 63 | Temp 98.0°F | Resp 18 | Ht 63.0 in | Wt 206.9 lb

## 2023-03-28 DIAGNOSIS — C50412 Malignant neoplasm of upper-outer quadrant of left female breast: Secondary | ICD-10-CM

## 2023-03-28 DIAGNOSIS — Z85038 Personal history of other malignant neoplasm of large intestine: Secondary | ICD-10-CM | POA: Diagnosis not present

## 2023-03-28 DIAGNOSIS — Z21 Asymptomatic human immunodeficiency virus [HIV] infection status: Secondary | ICD-10-CM | POA: Insufficient documentation

## 2023-03-28 DIAGNOSIS — D6959 Other secondary thrombocytopenia: Secondary | ICD-10-CM | POA: Diagnosis not present

## 2023-03-28 DIAGNOSIS — Z79624 Long term (current) use of inhibitors of nucleotide synthesis: Secondary | ICD-10-CM | POA: Insufficient documentation

## 2023-03-28 DIAGNOSIS — Z170 Estrogen receptor positive status [ER+]: Secondary | ICD-10-CM | POA: Diagnosis not present

## 2023-03-28 DIAGNOSIS — B20 Human immunodeficiency virus [HIV] disease: Secondary | ICD-10-CM | POA: Diagnosis not present

## 2023-03-28 DIAGNOSIS — D696 Thrombocytopenia, unspecified: Secondary | ICD-10-CM | POA: Diagnosis not present

## 2023-03-28 DIAGNOSIS — D693 Immune thrombocytopenic purpura: Secondary | ICD-10-CM

## 2023-03-28 DIAGNOSIS — K7460 Unspecified cirrhosis of liver: Secondary | ICD-10-CM | POA: Diagnosis not present

## 2023-03-28 DIAGNOSIS — Z87891 Personal history of nicotine dependence: Secondary | ICD-10-CM | POA: Insufficient documentation

## 2023-03-28 DIAGNOSIS — Z8619 Personal history of other infectious and parasitic diseases: Secondary | ICD-10-CM | POA: Diagnosis not present

## 2023-03-28 LAB — CBC WITH DIFFERENTIAL (CANCER CENTER ONLY)
Abs Immature Granulocytes: 0.01 10*3/uL (ref 0.00–0.07)
Basophils Absolute: 0 10*3/uL (ref 0.0–0.1)
Basophils Relative: 0 %
Eosinophils Absolute: 0 10*3/uL (ref 0.0–0.5)
Eosinophils Relative: 1 %
HCT: 40.3 % (ref 36.0–46.0)
Hemoglobin: 13.1 g/dL (ref 12.0–15.0)
Immature Granulocytes: 1 %
Lymphocytes Relative: 33 %
Lymphs Abs: 0.6 10*3/uL — ABNORMAL LOW (ref 0.7–4.0)
MCH: 33.4 pg (ref 26.0–34.0)
MCHC: 32.5 g/dL (ref 30.0–36.0)
MCV: 102.8 fL — ABNORMAL HIGH (ref 80.0–100.0)
Monocytes Absolute: 0.2 10*3/uL (ref 0.1–1.0)
Monocytes Relative: 13 %
Neutro Abs: 0.9 10*3/uL — ABNORMAL LOW (ref 1.7–7.7)
Neutrophils Relative %: 52 %
Platelet Count: 78 10*3/uL — ABNORMAL LOW (ref 150–400)
RBC: 3.92 MIL/uL (ref 3.87–5.11)
RDW: 13 % (ref 11.5–15.5)
Smear Review: NORMAL
WBC Count: 1.7 10*3/uL — ABNORMAL LOW (ref 4.0–10.5)
nRBC: 0 % (ref 0.0–0.2)

## 2023-03-28 LAB — CMP (CANCER CENTER ONLY)
ALT: 11 U/L (ref 0–44)
AST: 23 U/L (ref 15–41)
Albumin: 3.4 g/dL — ABNORMAL LOW (ref 3.5–5.0)
Alkaline Phosphatase: 78 U/L (ref 38–126)
Anion gap: 3 — ABNORMAL LOW (ref 5–15)
BUN: 10 mg/dL (ref 8–23)
CO2: 32 mmol/L (ref 22–32)
Calcium: 9.4 mg/dL (ref 8.9–10.3)
Chloride: 107 mmol/L (ref 98–111)
Creatinine: 0.66 mg/dL (ref 0.44–1.00)
GFR, Estimated: >60 mL/min (ref >60)
Glucose, Bld: 102 mg/dL — ABNORMAL HIGH (ref 70–99)
Potassium: 4.1 mmol/L (ref 3.5–5.1)
Sodium: 142 mmol/L (ref 135–145)
Total Bilirubin: 1 mg/dL (ref 0.3–1.2)
Total Protein: 7.4 g/dL (ref 6.5–8.1)

## 2023-03-28 LAB — GENETIC SCREENING ORDER

## 2023-03-28 NOTE — Progress Notes (Signed)
[facility] CONSULT NOTE  Patient Care Team: [name], MD as PCP - General (Infectious Diseases) Cliffton Asters, MD (Inactive) as PCP - Infectious Diseases (Infectious Diseases) Henrietta Hoover, NP (Family Medicine) Glendale Chard, DO as Consulting Physician (Neurology) Pershing Proud, RN as Oncology Nurse Navigator Donnelly Angelica, RN as Oncology Nurse Navigator Almond Lint, MD as Consulting Physician (General Surgery) Rachel Moulds, MD as Consulting Physician (Hematology and Oncology) Dorothy Puffer, MD as Consulting Physician (Radiation Oncology)  CHIEF COMPLAINTS/PURPOSE OF CONSULTATION:  Newly diagnosed breast cancer  HISTORY OF PRESENTING ILLNESS:  Ruth Stephenson 71 y.o. female is here because of recent diagnosis of left breast cancer  I reviewed her records extensively and collaborated the history with the patient.  SUMMARY OF ONCOLOGIC HISTORY: Oncology History  Malignant neoplasm of upper-outer quadrant of left female breast (HCC)  03/15/2023 Mammogram   Highly suspicious 1.5 cm UPPER-OUTER LEFT breast mass. Tissue sampling is recommended. Deep LEFT axillary lymph node with borderline cortical thickening. Attempt at tissue sampling is recommended.   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)   03/27/2023 Cancer Staging   Staging form: Breast, AJCC 8th Edition - Clinical stage from 03/27/2023: Stage IA (cT1c, cN0, cM0, G2, ER+, PR+, HER2-) - Signed by Ronny Bacon, PA-C on 03/27/2023 Stage prefix: Initial diagnosis Method of lymph node assessment: Clinical Histologic grading system: 3 grade system    Pathology Results    INVASIVE MODERATELY DIFFERENTIATED DUCTAL ADENOCARCINOMA, GRADE 2 (3+2+1)      DUCTAL CARCINOMA IN SITU, INTERMEDIATE NUCLEAR GRADE, SOLID AND CRIBRIFORM TYPES      WITHOUT NECROSIS      TUBULE FORMATION: SCORE 3      NUCLEAR PLEOMORPHISM: SCORE 2       MITOTIC COUNT: SCORE 1      TOTAL SCORE: 6      OVERALL GRADE GRADE 2 (6/9)      RARE MICROCALCIFICATION PRESENT WITHIN INVASIVE TUMOR      NEGATIVE FOR ANGIOLYMPHATIC INVASION      TUMOR MEASURES 11 MM IN GREATEST LINEAR EXTENT    The tumor cells are negative for Her2 (1+). Estrogen Receptor:  95%, POSITIVE, STRONG STAINING INTENSITY Progesterone Receptor:  30%, POSITIVE, STRONG STAINING INTENSITY Proliferation Marker Ki67:  5%     The patient, with a history of colon cancer and HIV, presents with a recent diagnosis of breast cancer. The patient noticed a lump in her breast about a month ago, which led to a mammogram, ultrasound, and subsequent biopsy. The patient reports that the lump is sore. The patient has not had a mammogram in many yrs. The patient also reports a recent episode of cellulitis in the leg, which she believes was worsened by a visit to a hospital. The patient has a history of poor circulation in the legs and is awaiting a consultation with a vein specialist. The patient is currently taking methadone and Dovato. The patient expresses significant fear and apprehension about the diagnosis and treatment, citing a traumatic experience with her mother's cancer treatment. The patient also expresses a preference for natural remedies and a desire to maintain her active lifestyle.  MEDICAL HISTORY:  Past Medical History:  Diagnosis Date   Angio-edema    Breast cancer (HCC)    Cancer (HCC)    Cancer of right colon (HCC) 10/24/2012   Discussed with h/o- no need for further f/u (08-2020)     Hepatitis C  History of COVID-19 06/03/2020   HIV (human immunodeficiency virus infection) (HCC)    Polysubstance abuse (HCC)     SURGICAL HISTORY: Past Surgical History:  Procedure Laterality Date   BREAST BIOPSY Left 03/22/2023   [place] LT BREAST BX W LOC DEV 1ST LESION IMG BX SPEC US GUIDE 03/22/2023 [facility] MAMMOGRAPHY   CARPAL TUNNEL RELEASE Left 01/19/2014   Procedure: LEFT CARPAL TUNNEL  RELEASE;  Surgeon: Marlowe Shores, MD;  Location: [facility];  Service: Orthopedics;  Laterality: Left;   COLON RESECTION N/A 09/23/2012   Procedure: LAPAROSCOPIC RIGHT COLON RESECTION   ;  Surgeon: Mariella Saa, MD;  Location: [facility];  Service: General;  Laterality: N/A;  LAPOROSCOPY, RIGHT HEMICOLECTOMY   COLON SURGERY  09/23/2012   lap right colectomy   COLONOSCOPY N/A 09/20/2012   Procedure: COLONOSCOPY;  Surgeon: Theda Belfast, MD;  Location: [facility];  Service: Endoscopy;  Laterality: N/A;   OPEN REDUCTION INTERNAL FIXATION (ORIF) DISTAL RADIAL FRACTURE Left 01/19/2014   Procedure: OPEN REDUCTION INTERNAL FIXATION (ORIF) LEFT  DISTAL RADIAL FRACTURE;  Surgeon: Marlowe Shores, MD;  Location: [facility];  Service: Orthopedics;  Laterality: Left;    SOCIAL HISTORY: Social History   Socioeconomic History   Marital status: Single    Spouse name: Not on file   Number of children: 5   Years of education: Not on file   Highest education level: Not on file  Occupational History   Not on file  Tobacco Use   Smoking status: Former    Current packs/day: 0.50    Average packs/day: 0.5 packs/day for 35.0 years (17.5 ttl pk-yrs)    Types: Cigarettes    Passive exposure: Past   Smokeless tobacco: Former  Building services engineer status: Never Used  Substance and Sexual Activity   Alcohol use: Not Currently   Drug use: Not Currently    Comment: methadone for drug rehab   Sexual activity: Not Currently    Comment: declined condoms  Other Topics Concern   Not on file  Social History Narrative   ** Merged History Encounter **       Social Determinants of Health   Financial Resource Strain: Medium Risk (10/03/2022)   Overall Financial Resource Strain (CARDIA)    Difficulty of Paying Living Expenses: Somewhat hard  Food Insecurity: No Food Insecurity (03/05/2023)   Hunger Vital Sign    Worried About Running Out of Food in the Last Year: Never  true    Ran Out of Food in the Last Year: Never true  Transportation Needs: No Transportation Needs (03/05/2023)   PRAPARE - Administrator, Civil Service (Medical): No    Lack of Transportation (Non-Medical): No  Physical Activity: Sufficiently Active (10/03/2022)   Exercise Vital Sign    Days of Exercise per Week: 7 days    Minutes of Exercise per Session: 40 min  Stress: No Stress Concern Present (10/03/2022)   [employer] of Occupational Health - Occupational Stress Questionnaire    Feeling of Stress : Not at all  Social Connections: Moderately Isolated (10/03/2022)   Social Connection and Isolation Panel [NHANES]    Frequency of Communication with Friends and Family: More than three times a week    Frequency of Social Gatherings with Friends and Family: Three times a week    Attends Religious Services: More than 4 times per year    Active Member of Clubs or Organizations: No  Attends Banker Meetings: Never    Marital Status: Never married  Intimate Partner Violence: Not At Risk (02/26/2023)   Humiliation, Afraid, Rape, and Kick questionnaire    Fear of Current or Ex-Partner: No    Emotionally Abused: No    Physically Abused: No    Sexually Abused: No    FAMILY HISTORY: Family History  Problem Relation Age of Onset   Angioedema Mother    Cancer Mother    Asthma Brother    Angioedema Maternal Grandmother     ALLERGIES:  has No Known Allergies.  MEDICATIONS:  Current Outpatient Medications  Medication Sig Dispense Refill   acetaminophen (TYLENOL) 325 MG tablet Take 2 tablets (650 mg total) by mouth every 6 (six) hours as needed for mild pain (or Fever >/= 101). 20 tablet 0   bisacodyl (DULCOLAX) 5 MG EC tablet Take 2 tablets (10 mg total) by mouth daily. 30 tablet 0   DOVATO 50-300 MG tablet TAKE 1 TABLET BY MOUTH EVERY DAY 30 tablet 11   GINKGO BILOBA PLUS PO Take 1 capsule by mouth daily.     methadone (DOLOPHINE) 10 MG/5ML solution Take 23 mg  by mouth in the morning.     ondansetron (ZOFRAN) 4 MG tablet Take 1 tablet (4 mg total) by mouth every 6 (six) hours as needed for nausea. 20 tablet 0   No current facility-administered medications for this visit.    REVIEW OF SYSTEMS:   Constitutional: Denies fevers, chills or abnormal night sweats Eyes: Denies blurriness of vision, double vision or watery eyes Ears, nose, mouth, throat, and face: Denies mucositis or sore throat Respiratory: Denies cough, dyspnea or wheezes Cardiovascular: Denies palpitation, chest discomfort or lower extremity swelling Gastrointestinal:  Denies nausea, heartburn or change in bowel habits Skin: Denies abnormal skin rashes Lymphatics: Denies new lymphadenopathy or easy bruising Neurological:Denies numbness, tingling or new weaknesses Behavioral/Psych: Mood is stable, no new changes  Breast: palpable lump in the left breast upper outer quadrant All other systems were reviewed with the patient and are negative.  PHYSICAL EXAMINATION: ECOG PERFORMANCE STATUS: 0 - Asymptomatic  Vitals:   03/28/23 0917  BP: (!) 146/66  Pulse: 63  Resp: 18  Temp: 98 F (36.7 C)  SpO2: 97%   Filed Weights   03/28/23 0917  Weight: 206 lb 14.4 oz (93.8 kg)    GENERAL:alert, no distress and comfortable LYMPH:  no palpable lymphadenopathy in the cervical, axillary  BREAST: Palpable left breast mass in the upper outer quadrant measuring approximately 2 cm.  The regional adenopathy is not palpable  LABORATORY DATA:  I have reviewed the data as listed Lab Results  Component Value Date   WBC 1.7 (L) 03/28/2023   HGB 13.1 03/28/2023   HCT 40.3 03/28/2023   MCV 102.8 (H) 03/28/2023   PLT 78 (L) 03/28/2023   Lab Results  Component Value Date   NA 142 03/28/2023   K 4.1 03/28/2023   CL 107 03/28/2023   CO2 32 03/28/2023    RADIOGRAPHIC STUDIES: I have personally reviewed the radiological reports and agreed with the findings in the report.  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  Patient  arrived to the appointment today by herself.  Breast Cancer Stage 1 breast cancer diagnosed via biopsy. Patient was informed of diagnosis and treatment plan. Patient expressed fear and uncertainty about treatment. -Plan for lumpectomy by Dr. Donell Beers. -We will consider postop Oncotype although she seems to be very opposed to conventional treatments to see if there is any role for adjuvant chemotherapy.  Given strong ER positivity, low proliferation index and intermediate grade, it is likely that she will not need any adjuvant chemotherapy. -Consider post-operative radiation therapy, to be discussed with radiation oncology team. -Plan for anti-estrogen therapy for 5 years post-surgery to prevent recurrence.  She is also very uninterested to take any pills.  She is hoping that she does not have to take any pills after surgery.  We have discussed that the role of antiestrogen therapy is to reduce the risk of recurrence including systemic recurrence.  Cellulitis Recent episode of cellulitis in the leg, treated with antibiotics. Patient reported improvement with home remedies. -Ensure completion of antibiotic course. -Follow-up with vein specialist for evaluation of poor leg circulation.  HIV Long-standing diagnosis, well-managed with Dovato. -Continue Dovato as prescribed.  Chronic thrombocytopenia, likely related to her hepatitis C.  No indication for transfusion.  General Health Maintenance -Encourage patient to maintain regular mammogram screenings in the future. -Follow-up after surgery and when needed for  management of breast cancer treatment.   All questions were answered. The patient knows to call the clinic with any problems, questions or concerns.    Rachel Moulds, MD 03/28/23

## 2023-03-28 NOTE — Progress Notes (Addendum)
REFERRING PROVIDER: Rachel Moulds, MD  PRIMARY PROVIDER:  [name], MD  PRIMARY REASON FOR VISIT:  1. Malignant neoplasm of upper-outer quadrant of left breast in female, estrogen receptor positive (HCC)    HISTORY OF PRESENT ILLNESS:   Ruth Stephenson, a 72 y.o. female, was seen for a [facility] cancer genetics consultation at the request of Dr. Al Pimple due to a personal and family history of cancer.  [name] presents to clinic today to discuss the possibility of a hereditary predisposition to cancer, to discuss genetic testing, and to further clarify her future cancer risks, as well as potential cancer risks for family members.   In September 2024, at the age of 58, [name] was diagnosed with invasive ductal carcinoma of the left breast (ER/PR positive, HER2 negative). She also has a history of colon cancer diagnosed at age 85.  CANCER HISTORY:  Oncology History  Malignant neoplasm of upper-outer quadrant of left female breast (HCC)  03/15/2023 Mammogram   Highly suspicious 1.5 cm UPPER-OUTER LEFT breast mass. Tissue sampling is recommended. Deep LEFT axillary lymph node with borderline cortical thickening. Attempt at tissue sampling is recommended.   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)   03/27/2023 Cancer Staging   Staging form: Breast, AJCC 8th Edition - Clinical stage from 03/27/2023: Stage IA (cT1c, cN0, cM0, G2, ER+, PR+, HER2-) - Signed by Ronny Bacon, PA-C on 03/27/2023 Stage prefix: Initial diagnosis Method of lymph node assessment: Clinical Histologic grading system: 3 grade system    Pathology Results    INVASIVE MODERATELY DIFFERENTIATED DUCTAL ADENOCARCINOMA, GRADE 2 (3+2+1)      DUCTAL CARCINOMA IN SITU, INTERMEDIATE NUCLEAR GRADE, SOLID AND CRIBRIFORM TYPES      WITHOUT NECROSIS      TUBULE FORMATION: SCORE 3      NUCLEAR PLEOMORPHISM: SCORE 2      MITOTIC COUNT: SCORE 1      TOTAL  SCORE: 6      OVERALL GRADE GRADE 2 (6/9)      RARE MICROCALCIFICATION PRESENT WITHIN INVASIVE TUMOR      NEGATIVE FOR ANGIOLYMPHATIC INVASION      TUMOR MEASURES 11 MM IN GREATEST LINEAR EXTENT    The tumor cells are negative for Her2 (1+). Estrogen Receptor:  95%, POSITIVE, STRONG STAINING INTENSITY Progesterone Receptor:  30%, POSITIVE, STRONG STAINING INTENSITY Proliferation Marker Ki67:  5%      Past Medical History:  Diagnosis Date   Angio-edema    Breast cancer (HCC)    Cancer (HCC)    Cancer of right colon (HCC) 10/24/2012   Discussed with h/o- no need for further f/u (08-2020)     Hepatitis C    History of COVID-19 06/03/2020   HIV (human immunodeficiency virus infection) (HCC)    Polysubstance abuse (HCC)     Past Surgical History:  Procedure Laterality Date   BREAST BIOPSY Left 03/22/2023   [place] LT BREAST BX W LOC DEV 1ST LESION IMG BX SPEC US GUIDE 03/22/2023 [facility] MAMMOGRAPHY   CARPAL TUNNEL RELEASE Left 01/19/2014   Procedure: LEFT CARPAL TUNNEL RELEASE;  Surgeon: Marlowe Shores, MD;  Location: [facility];  Service: Orthopedics;  Laterality: Left;   COLON RESECTION N/A 09/23/2012   Procedure: LAPAROSCOPIC RIGHT COLON RESECTION   ;  Surgeon: Mariella Saa, MD;  Location: [facility];  Service: General;  Laterality: N/A;  LAPOROSCOPY, RIGHT HEMICOLECTOMY   COLON SURGERY  09/23/2012  lap right colectomy   COLONOSCOPY N/A 09/20/2012   Procedure: COLONOSCOPY;  Surgeon: Theda Belfast, MD;  Location: [facility];  Service: Endoscopy;  Laterality: N/A;   OPEN REDUCTION INTERNAL FIXATION (ORIF) DISTAL RADIAL FRACTURE Left 01/19/2014   Procedure: OPEN REDUCTION INTERNAL FIXATION (ORIF) LEFT  DISTAL RADIAL FRACTURE;  Surgeon: Marlowe Shores, MD;  Location: [facility];  Service: Orthopedics;  Laterality: Left;    Social History   Socioeconomic History   Marital status: Single    Spouse name: Not on file   Number of children: 5    Years of education: Not on file   Highest education level: Not on file  Occupational History   Not on file  Tobacco Use   Smoking status: Former    Current packs/day: 0.50    Average packs/day: 0.5 packs/day for 35.0 years (17.5 ttl pk-yrs)    Types: Cigarettes    Passive exposure: Past   Smokeless tobacco: Former  Building services engineer status: Never Used  Substance and Sexual Activity   Alcohol use: Not Currently   Drug use: Not Currently    Comment: methadone for drug rehab   Sexual activity: Not Currently    Comment: declined condoms  Other Topics Concern   Not on file  Social History Narrative   ** Merged History Encounter **       Social Determinants of Health   Financial Resource Strain: Medium Risk (10/03/2022)   Overall Financial Resource Strain (CARDIA)    Difficulty of Paying Living Expenses: Somewhat hard  Food Insecurity: No Food Insecurity (03/05/2023)   Hunger Vital Sign    Worried About Running Out of Food in the Last Year: Never true    Ran Out of Food in the Last Year: Never true  Transportation Needs: No Transportation Needs (03/05/2023)   PRAPARE - Administrator, Civil Service (Medical): No    Lack of Transportation (Non-Medical): No  Physical Activity: Sufficiently Active (10/03/2022)   Exercise Vital Sign    Days of Exercise per Week: 7 days    Minutes of Exercise per Session: 40 min  Stress: No Stress Concern Present (10/03/2022)   [employer] of Occupational Health - Occupational Stress Questionnaire    Feeling of Stress : Not at all  Social Connections: Moderately Isolated (10/03/2022)   Social Connection and Isolation Panel [NHANES]    Frequency of Communication with Friends and Family: More than three times a week    Frequency of Social Gatherings with Friends and Family: Three times a week    Attends Religious Services: More than 4 times per year    Active Member of Clubs or Organizations: No    Attends Banker Meetings:  Never    Marital Status: Never married     FAMILY HISTORY:  We obtained a detailed, 4-generation family history.  Significant diagnoses are listed below: Family History  Problem Relation Age of Onset   Angioedema Mother    Cancer Mother 17 - 70       unknown type   Asthma Brother    Throat cancer Maternal Uncle    Angioedema Maternal Grandmother      Ms. Mclin is unaware of previous family history of genetic testing for hereditary cancer risks. There is no reported Ashkenazi Jewish ancestry.   GENETIC COUNSELING ASSESSMENT: Ms. Dun is a 72 y.o. female with a personal and family history of cancer which is somewhat suggestive of a hereditary predisposition to  cancer. We, therefore, discussed and recommended the following at today's visit.   DISCUSSION: We discussed that 5 - 10% of cancer is hereditary, with most cases of breast cancer associated with BRCA1/2.  There are other genes that can be associated with hereditary breast cancer syndromes.  We discussed that testing is beneficial for several reasons including knowing how to follow individuals after completing their treatment, identifying whether potential treatment options would be beneficial, and understanding if other family members could be at risk for cancer and allowing them to undergo genetic testing.   We reviewed the characteristics, features and inheritance patterns of hereditary cancer syndromes. We also discussed genetic testing, including the appropriate family members to test, the process of testing, insurance coverage and turn-around-time for results. We discussed the implications of a negative, positive, carrier and/or variant of uncertain significant result. We recommended Ms. Wachs pursue genetic testing for a panel that includes genes associated with breast and colon cancer.   Ms. Mcmasters elected to have [facility] CancerNext-Expanded Panel. The CancerNext-Expanded gene panel offered by [facility] and includes sequencing,  rearrangement, and RNA analysis for the following 71 genes: AIP, ALK, APC, ATM, AXIN2, BAP1, BARD1, BMPR1A, BRCA1, BRCA2, BRIP1, CDC73, CDH1, CDK4, CDKN1B, CDKN2A, CHEK2, CTNNA1, DICER1, FH, FLCN, KIF1B, LZTR1, MAX, MEN1, MET, MLH1, MSH2, MSH3, MSH6, MUTYH, NF1, NF2, NTHL1, PALB2, PHOX2B, PMS2, POT1, PRKAR1A, PTCH1, PTEN, RAD51C, RAD51D, RB1, RET, SDHA, SDHAF2, SDHB, SDHC, SDHD, SMAD4, SMARCA4, SMARCB1, SMARCE1, STK11, SUFU, TMEM127, TP53, TSC1, TSC2, and VHL (sequencing and deletion/duplication); EGFR, EGLN1, HOXB13, KIT, MITF, PDGFRA, POLD1, and POLE (sequencing only); EPCAM and GREM1 (deletion/duplication only).   Based on Ms. Descoteaux's personal and family history of cancer, she does not meet NCCN criteria for genetic testing. She currently has [employer] and Medicaid as her insurance and should not have any out of pocket cost for the testing.  PLAN: After considering the risks, benefits, and limitations, Ms. Irving provided informed consent to pursue genetic testing and the blood sample was sent to [facility] for analysis of the CancerNext-Expanded Panel. Results should be available within approximately 2-3 weeks' time, at which point they will be disclosed by telephone to Ms. Storck, as will any additional recommendations warranted by these results. Ms. Emley will receive a summary of her genetic counseling visit and a copy of her results once available. This information will also be available in Epic.   Ms. Vaillancourt's questions were answered to her satisfaction today. Our contact information was provided should additional questions or concerns arise. Thank you for the referral and allowing [place] to share in the care of your patient.   Lalla Brothers, MS, [facility] Genetic Counselor [email or web address] (P) [phone]  The patient was seen for a total of <15 minutes in face-to-face genetic counseling. The patient was seen alone.  Drs. Pamelia Hoit and/or Mosetta Putt were available to discuss this case  as needed.   _______________________________________________________________________ For Office Staff:  Number of people involved in session: 1 Was an Intern/ student involved with case: no

## 2023-03-28 NOTE — Assessment & Plan Note (Addendum)
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  Patient arrived to the appointment today by herself.  Breast Cancer Stage 1 breast cancer diagnosed via biopsy. Patient was informed of diagnosis and treatment plan. Patient expressed fear and uncertainty about treatment. -Plan for lumpectomy by Dr. Donell Beers. -We will consider postop Oncotype although she seems to be very opposed to conventional treatments to see if there is any role for adjuvant chemotherapy.  Given strong ER positivity, low proliferation index and intermediate grade, it is likely that she will not need any adjuvant chemotherapy. -Consider post-operative radiation therapy, to be discussed with radiation oncology team. -Plan for anti-estrogen therapy for 5 years post-surgery to prevent recurrence.  She is also very uninterested to take any pills.  She is hoping that she does not have to take any pills after surgery.  We have discussed that the role of antiestrogen therapy is to reduce the risk of recurrence including systemic recurrence.  Cellulitis Recent episode of cellulitis in the leg, treated with antibiotics. Patient reported improvement with home remedies. -Ensure completion of antibiotic course. -Follow-up with vein specialist for evaluation of poor leg circulation.  HIV Long-standing diagnosis, well-managed with Dovato. -Continue Dovato as prescribed.  Chronic thrombocytopenia, likely related to her hepatitis C.  No indication for transfusion.  General Health Maintenance -Encourage patient to maintain regular mammogram screenings in the future. -Follow-up after surgery and when needed for management of breast cancer treatment.

## 2023-04-02 DIAGNOSIS — F1120 Opioid dependence, uncomplicated: Secondary | ICD-10-CM | POA: Diagnosis not present

## 2023-04-04 ENCOUNTER — Encounter: Payer: Self-pay | Admitting: *Deleted

## 2023-04-04 ENCOUNTER — Other Ambulatory Visit: Payer: Self-pay | Admitting: General Surgery

## 2023-04-04 ENCOUNTER — Telehealth: Payer: Self-pay | Admitting: *Deleted

## 2023-04-04 DIAGNOSIS — C50412 Malignant neoplasm of upper-outer quadrant of left female breast: Secondary | ICD-10-CM

## 2023-04-04 NOTE — Telephone Encounter (Signed)
Spoke to pt concerning [facility] from 03/28/23. Denies questions or concerns regarding dx or treatment care plan. Encourage pt to call with needs. Received verbal understanding.  Discussed next steps after surgery.

## 2023-04-09 ENCOUNTER — Encounter (HOSPITAL_COMMUNITY): Payer: Self-pay | Admitting: General Surgery

## 2023-04-09 DIAGNOSIS — C50912 Malignant neoplasm of unspecified site of left female breast: Secondary | ICD-10-CM | POA: Diagnosis not present

## 2023-04-09 DIAGNOSIS — F1120 Opioid dependence, uncomplicated: Secondary | ICD-10-CM | POA: Diagnosis not present

## 2023-04-10 ENCOUNTER — Other Ambulatory Visit: Payer: Self-pay | Admitting: General Surgery

## 2023-04-10 ENCOUNTER — Encounter: Payer: Medicare HMO | Admitting: Infectious Diseases

## 2023-04-10 ENCOUNTER — Ambulatory Visit
Admission: RE | Admit: 2023-04-10 | Discharge: 2023-04-10 | Disposition: A | Discharge status: Home / Self Care | Payer: Medicare HMO | Source: Ambulatory Visit | Attending: General Surgery | Admitting: General Surgery

## 2023-04-10 ENCOUNTER — Other Ambulatory Visit: Payer: Self-pay

## 2023-04-10 ENCOUNTER — Encounter (HOSPITAL_COMMUNITY): Payer: Self-pay | Admitting: General Surgery

## 2023-04-10 DIAGNOSIS — Z170 Estrogen receptor positive status [ER+]: Secondary | ICD-10-CM

## 2023-04-10 DIAGNOSIS — N6321 Unspecified lump in the left breast, upper outer quadrant: Secondary | ICD-10-CM | POA: Diagnosis not present

## 2023-04-10 HISTORY — PX: BREAST BIOPSY: SHX20

## 2023-04-10 NOTE — Anesthesia Preprocedure Evaluation (Signed)
Anesthesia Evaluation  Patient identified by MRN, date of birth, ID band Patient awake    Reviewed: Allergy & Precautions, H&P , NPO status , Patient's Chart, lab work & pertinent test results  Airway Mallampati: II  TM Distance: >3 FB Neck ROM: Full    Dental no notable dental hx. (+) Upper Dentures, Lower Dentures, Dental Advisory Given   Pulmonary pneumonia, former smoker   Pulmonary exam normal breath sounds clear to auscultation       Cardiovascular negative cardio ROS  Rhythm:Regular Rate:Normal     Neuro/Psych  PSYCHIATRIC DISORDERS  Depression    negative neurological ROS  negative psych ROS   GI/Hepatic negative GI ROS,GERD  ,,(+) Hepatitis -, C  Endo/Other  negative endocrine ROS    Renal/GU negative Renal ROS  negative genitourinary   Musculoskeletal   Abdominal   Peds  Hematology  (+) Blood dyscrasia, anemia , HIV  Anesthesia Other Findings   Reproductive/Obstetrics negative OB ROS                             Anesthesia Physical Anesthesia Plan  ASA: 3  Anesthesia Plan: General   Post-op Pain Management:    Induction: Intravenous  PONV Risk Score and Plan: 3 and Ondansetron and Dexamethasone  Airway Management Planned: Oral ETT and LMA  Additional Equipment: None  Intra-op Plan:   Post-operative Plan: Extubation in OR  Informed Consent: I have reviewed the patients History and Physical, chart, labs and discussed the procedure including the risks, benefits and alternatives for the proposed anesthesia with the patient or authorized representative who has indicated his/her understanding and acceptance.     Dental advisory given  Plan Discussed with: CRNA and Anesthesiologist  Anesthesia Plan Comments: (PAT note written 04/10/2023 by Shonna Chock, PA-C. : Patient is a 72 year old female scheduled for the above procedure.   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 Dellis Anes, referred to ENT).     Known cirrhosis and chronic bicytopenia and leukopenia. WBC primarily in the 1.4-2.9 range and PLT 86-108K since June 2023. Labs on 03/28/23 were consistent with prior results showing WBC 1.7, H/H 13.1/40.3, PLT 78K.  AST 23, ALT 11 then.    EKG: 02/26/23: Sinus rhythm Borderline short PR interval Borderline abnrm T, anterolateral leads No significant change since last tracing Confirmed by Lorre Nick ([id number]) on 02/26/2023 8:30:11 PM     CV: LE Venous US 02/28/23: Summary:  RIGHT:  - There is no evidence of deep vein thrombosis in the lower extremity.  - No cystic structure found in the popliteal fossa.  - Ultrasound characteristics of enlarged lymph nodes are noted in the  groin.  LEFT:  - No evidence of common femoral vein obstruction.       Echo 03/11/21: IMPRESSIONS   1. Left ventricular ejection fraction, by estimation, is 60 to 65%. The  left ventricle has normal function. The left ventricle has no regional  wall motion abnormalities. There is mild concentric left ventricular  hypertrophy. Left ventricular diastolic  parameters were  normal.   2. Right ventricular systolic function is normal. The right ventricular  size is normal. There is mildly elevated pulmonary artery systolic  pressure. The estimated right ventricular systolic pressure is 45.0 mmHg.   3. Left atrial  size was mildly dilated.   4. The mitral valve is normal in structure. No evidence of mitral valve  regurgitation.   5. The aortic valve is tricuspid. Aortic valve regurgitation is not  visualized. No aortic stenosis is present.   6. The inferior vena cava is dilated in size with >50% respiratory  variability, suggesting right atrial pressure of 8 mmHg.     )        Anesthesia Quick Evaluation

## 2023-04-10 NOTE — Progress Notes (Signed)
Anesthesia Chart Review: Ruth Stephenson  Case: [id number] Date/Time: 04/11/23 1041   Procedure: LEFT BREAST SEED LOCALIZED LUMPECTOMY (Left)   Anesthesia type: General   Pre-op diagnosis: LEFT BREAST CANCER   [facility] / [facility]   Surgeons: Almond Lint, MD       DISCUSSION: Patient is a 72 year old female scheduled for the above procedure.  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 Dellis Anes, referred to ENT).    Known cirrhosis and chronic bicytopenia and leukopenia. WBC primarily in the 1.4-2.9 range and PLT 86-108K since June 2023. Labs on 03/28/23 were consistent with prior results showing WBC 1.7, H/H 13.1/40.3, PLT 78K.  AST 23, ALT 11 then.   Radioactive seed was already placed on 04/10/23 at 10:30 AM. She is a same day work-up, so anesthesia team to evaluate on the day of surgery.    VS:  Wt Readings from Last 3 Encounters:  03/28/23 93.8 kg  02/28/23 91.2 kg  02/20/23 94 kg   BP Readings from Last 3 Encounters:  03/28/23 (!) 146/66  03/01/23 (!) 144/77  02/20/23 112/63   Pulse Readings from Last 3 Encounters:  03/28/23 63  03/01/23 (!) 56  02/20/23 (!) 55     PROVIDERS: [name], MD is ID Dorothy Puffer, MD is RAD-ON[facility] Rachel Moulds, MD is HEM-ONC Malachi Bonds, MD is allergist   LABS: Most recent lab results in [name] include: Lab Results  Component Value Date   WBC 1.7  (L) 03/28/2023   HGB 13.1 03/28/2023   HCT 40.3 03/28/2023   PLT 78 (L) 03/28/2023   GLUCOSE 102 (H) 03/28/2023   ALT 11 03/28/2023   AST 23 03/28/2023   NA 142 03/28/2023   K 4.1 03/28/2023   CL 107 03/28/2023   CREATININE 0.66 03/28/2023   BUN 10 03/28/2023   CO2 32 03/28/2023   HGBA1C 5.6 10/03/2022     IMAGES: CXR 04/26/22: FINDINGS: Lungs are clear. No pneumothorax or pleural effusion. Cardiac size within normal limits. Pulmonary vascularity is normal. No acute bone abnormality. Osseous structures are age-appropriate. IMPRESSION: No active cardiopulmonary disease.  CTA Chest/abd/pelvis 03/10/21: IMPRESSION: 1. No evidence of thoracoabdominal aortic aneurysm or dissection. 2. Multifocal right-sided bronchopneumonia. 3. 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 4. Small fat containing supraumbilical ventral hernia. 5. Aortic Atherosclerosis (ICD10-I70.0) and Emphysema (ICD10-J43.9).   EKG: 02/26/23: Sinus rhythm Borderline short PR interval Borderline abnrm T, anterolateral leads No significant change since last tracing Confirmed by Lorre Nick ([id number]) on 02/26/2023 8:30:11 PM   CV: LE Venous US 02/28/23: Summary:  RIGHT:  - There is no evidence of deep vein thrombosis in the lower extremity.  - No cystic structure found in the popliteal fossa.  - Ultrasound characteristics of enlarged lymph nodes are noted in the  groin.  LEFT:  - No evidence of common femoral vein obstruction.      Echo 03/11/21: IMPRESSIONS   1. Left ventricular ejection fraction, by estimation, is 60 to 65%. The  left ventricle has normal function. The left ventricle has no  regional  wall motion abnormalities. There is mild concentric left ventricular  hypertrophy. Left ventricular diastolic  parameters were normal.   2. Right ventricular systolic function is normal. The right ventricular  size is normal. There is mildly elevated pulmonary artery systolic   pressure. The estimated right ventricular systolic pressure is 45.0 mmHg.   3. Left atrial size was mildly dilated.   4. The mitral valve is normal in structure. No evidence of mitral valve  regurgitation.   5. The aortic valve is tricuspid. Aortic valve regurgitation is not  visualized. No aortic stenosis is present.   6. The inferior vena cava is dilated in size with >50% respiratory  variability, suggesting right atrial pressure of 8 mmHg.    Past Medical History:  Diagnosis Date   Angio-edema    Breast cancer (HCC)    Cancer (HCC)    Cancer of right colon (HCC) 10/24/2012   Discussed with h/o- no need for further f/u (08-2020)     Cirrhosis of liver (HCC)    had a liver u/s in 2014 that showed that she was cirrhotic.   Hepatitis C    treated (Harvoni) 2015   History of COVID-19 06/03/2020   HIV (human immunodeficiency virus infection) (HCC)    Pneumonia 03/15/2021   in hospital 02-2021 with Pneumococcal pna and non-adherence to meds.   Polysubstance abuse (HCC)    Tuberculosis     Past Surgical History:  Procedure Laterality Date   BREAST BIOPSY Left 03/22/2023   [place] LT BREAST BX W LOC DEV 1ST LESION IMG BX SPEC US GUIDE 03/22/2023 [facility]MOGRAP[facility]   CARPAL TUNNEL RELEASE Left 01/19/2014   Procedure: LEFT CARPAL TUNNEL RELEASE;  Surgeon: Marlowe Shores, MD;  Location: [facility];  Service: Orthopedics;  Laterality: Left;   COLON RESECTION N/A 09/23/2012   Procedure: LAPAROSCOPIC RIGHT COLON RESECTION   ;  Surgeon: Mariella Saa, MD;  Location: [facility];  Service: General;  Laterality: N/A;  LAPOROSCOPY, RIGHT HEMICOLECTOMY   COLON SURGERY  09/23/2012   lap right colectomy   COLONOSCOPY N/A 09/20/2012   Procedure: COLONOSCOPY;  Surgeon: Theda Belfast, MD;  Location: [facility];  Service: Endoscopy;  Laterality: N/A;   OPEN REDUCTION INTERNAL FIXATION (ORIF) DISTAL RADIAL FRACTURE Left 01/19/2014   Procedure: OPEN REDUCTION INTERNAL FIXATION (ORIF) LEFT   DISTAL RADIAL FRACTURE;  Surgeon: Marlowe Shores, MD;  Location: [facility];  Service: Orthopedics;  Laterality: Left;    MEDICATIONS: No current facility-administered medications for this encounter.    Cholecalciferol (VITAMIN D3) 50 MCG (2000 UT) capsule   DOVATO 50-300 MG tablet   GINKGO BILOBA PLUS PO   Menatetrenone (VITAMIN K2) 100 MCG TABS   methadone (DOLOPHINE) 10 MG/5ML solution   acetaminophen (TYLENOL) 325 MG tablet   bisacodyl (DULCOLAX) 5 MG EC tablet   ondansetron (ZOFRAN) 4 MG tablet    Shonna Chock, PA-C Surgical Short Stay/Anesthesiology [facility] Phone [phone] [facility] Main Phone [phone] 04/10/2023 11:32 AM

## 2023-04-10 NOTE — Progress Notes (Signed)
Cardiologist - none PCP/Infectious Diseases - Dr Johny Sax Oncology - Dr Iverson Alamin  Chest x-ray - 05/06/22 EKG - 02/26/23 Stress Test - n/a ECHO - 03/11/21 Cardiac Cath - n/a  ICD Pacemaker/Loop - n/a  Sleep Study -  n/a  Diabetes - n/a  ERAS: Clear liquids til 8 AM DOS.  Anesthesia review: Yes  STOP now taking any Aspirin (unless otherwise instructed by your surgeon), Aleve, Naproxen, Ibuprofen, Motrin, Advil, Goody's, BC's, all herbal medications, fish oil, and all vitamins.   Coronavirus Screening Do you have any of the following symptoms:  Cough yes/no: No Fever (>100.81F)  yes/no: No Runny nose yes/no: No Sore throat yes/no: No Difficulty breathing/shortness of breath  Yes  Have you traveled in the last 14 days and where? yes/no: No  Patient verbalized understanding of instructions that were given via phone.

## 2023-04-11 ENCOUNTER — Ambulatory Visit (HOSPITAL_COMMUNITY)
Admission: RE | Admit: 2023-04-11 | Discharge: 2023-04-11 | Disposition: A | Discharge status: Home / Self Care | Payer: Medicare HMO | Attending: General Surgery | Admitting: General Surgery

## 2023-04-11 ENCOUNTER — Ambulatory Visit (HOSPITAL_BASED_OUTPATIENT_CLINIC_OR_DEPARTMENT_OTHER): Payer: Self-pay | Admitting: Vascular Surgery

## 2023-04-11 ENCOUNTER — Other Ambulatory Visit (HOSPITAL_COMMUNITY): Payer: Self-pay

## 2023-04-11 ENCOUNTER — Ambulatory Visit
Admission: RE | Admit: 2023-04-11 | Discharge: 2023-04-11 | Disposition: A | Discharge status: Home / Self Care | Payer: Medicare HMO | Source: Ambulatory Visit | Attending: General Surgery | Admitting: General Surgery

## 2023-04-11 ENCOUNTER — Encounter (HOSPITAL_COMMUNITY): Payer: Self-pay | Admitting: General Surgery

## 2023-04-11 ENCOUNTER — Encounter (HOSPITAL_COMMUNITY)
Admission: RE | Disposition: A | Discharge status: Home / Self Care | Payer: Self-pay | Source: Home / Self Care | Attending: General Surgery

## 2023-04-11 ENCOUNTER — Other Ambulatory Visit: Payer: Self-pay

## 2023-04-11 ENCOUNTER — Ambulatory Visit (HOSPITAL_COMMUNITY): Payer: Medicare HMO | Admitting: Vascular Surgery

## 2023-04-11 DIAGNOSIS — D709 Neutropenia, unspecified: Secondary | ICD-10-CM | POA: Insufficient documentation

## 2023-04-11 DIAGNOSIS — Z170 Estrogen receptor positive status [ER+]: Secondary | ICD-10-CM | POA: Diagnosis not present

## 2023-04-11 DIAGNOSIS — C50412 Malignant neoplasm of upper-outer quadrant of left female breast: Secondary | ICD-10-CM

## 2023-04-11 DIAGNOSIS — Z809 Family history of malignant neoplasm, unspecified: Secondary | ICD-10-CM | POA: Insufficient documentation

## 2023-04-11 DIAGNOSIS — Z79891 Long term (current) use of opiate analgesic: Secondary | ICD-10-CM | POA: Diagnosis not present

## 2023-04-11 DIAGNOSIS — Z9049 Acquired absence of other specified parts of digestive tract: Secondary | ICD-10-CM | POA: Insufficient documentation

## 2023-04-11 DIAGNOSIS — C50912 Malignant neoplasm of unspecified site of left female breast: Secondary | ICD-10-CM | POA: Diagnosis not present

## 2023-04-11 DIAGNOSIS — N6321 Unspecified lump in the left breast, upper outer quadrant: Secondary | ICD-10-CM

## 2023-04-11 DIAGNOSIS — Z1721 Progesterone receptor positive status: Secondary | ICD-10-CM | POA: Insufficient documentation

## 2023-04-11 DIAGNOSIS — D696 Thrombocytopenia, unspecified: Secondary | ICD-10-CM | POA: Diagnosis not present

## 2023-04-11 DIAGNOSIS — Z8619 Personal history of other infectious and parasitic diseases: Secondary | ICD-10-CM | POA: Diagnosis not present

## 2023-04-11 DIAGNOSIS — Z21 Asymptomatic human immunodeficiency virus [HIV] infection status: Secondary | ICD-10-CM | POA: Diagnosis not present

## 2023-04-11 DIAGNOSIS — Z87891 Personal history of nicotine dependence: Secondary | ICD-10-CM | POA: Insufficient documentation

## 2023-04-11 DIAGNOSIS — Z79899 Other long term (current) drug therapy: Secondary | ICD-10-CM | POA: Insufficient documentation

## 2023-04-11 DIAGNOSIS — Z85038 Personal history of other malignant neoplasm of large intestine: Secondary | ICD-10-CM | POA: Insufficient documentation

## 2023-04-11 DIAGNOSIS — K7460 Unspecified cirrhosis of liver: Secondary | ICD-10-CM | POA: Insufficient documentation

## 2023-04-11 HISTORY — PX: BREAST LUMPECTOMY WITH RADIOACTIVE SEED LOCALIZATION: SHX6424

## 2023-04-11 HISTORY — DX: Respiratory tuberculosis unspecified: A15.9

## 2023-04-11 HISTORY — DX: Unspecified cirrhosis of liver: K74.60

## 2023-04-11 SURGERY — BREAST LUMPECTOMY WITH RADIOACTIVE SEED LOCALIZATION
Anesthesia: General | Site: Breast | Laterality: Left

## 2023-04-11 MED ORDER — ACETAMINOPHEN 160 MG/5ML PO SOLN: 325.0000 mg | ORAL | Status: DC | PRN

## 2023-04-11 MED ORDER — FENTANYL CITRATE (PF) 250 MCG/5ML IJ SOLN
INTRAMUSCULAR | Status: DC | PRN
  Administered 2023-04-11: 100 ug via INTRAVENOUS

## 2023-04-11 MED ORDER — LIDOCAINE HCL 1 % IJ SOLN
INTRAMUSCULAR | Status: DC | PRN
  Administered 2023-04-11: 32 mL via INTRAMUSCULAR

## 2023-04-11 MED ORDER — ORAL CARE MOUTH RINSE: 15.0000 mL | Freq: Once | OROMUCOSAL | Status: AC

## 2023-04-11 MED ORDER — BUPIVACAINE-EPINEPHRINE (PF) 0.25% -1:200000 IJ SOLN
INTRAMUSCULAR | Status: AC
  Filled 2023-04-11: qty 30

## 2023-04-11 MED ORDER — ROCURONIUM BROMIDE 10 MG/ML (PF) SYRINGE
PREFILLED_SYRINGE | INTRAVENOUS | Status: AC
  Filled 2023-04-11: qty 10

## 2023-04-11 MED ORDER — ONDANSETRON HCL 4 MG/2ML IJ SOLN
INTRAMUSCULAR | Status: DC | PRN
  Administered 2023-04-11: 4 mg via INTRAVENOUS

## 2023-04-11 MED ORDER — CHLORHEXIDINE GLUCONATE CLOTH 2 % EX PADS: 6.0000 | MEDICATED_PAD | Freq: Once | CUTANEOUS | Status: DC

## 2023-04-11 MED ORDER — CEFAZOLIN SODIUM-DEXTROSE 2-4 GM/100ML-% IV SOLN: 2.0000 g | INTRAVENOUS | Status: DC

## 2023-04-11 MED ORDER — OXYCODONE HCL 5 MG/5ML PO SOLN: 5.0000 mg | Freq: Once | ORAL | Status: DC | PRN

## 2023-04-11 MED ORDER — MIDAZOLAM HCL 2 MG/2ML IJ SOLN
INTRAMUSCULAR | Status: DC | PRN
  Administered 2023-04-11: 2 mg via INTRAVENOUS

## 2023-04-11 MED ORDER — OXYCODONE HCL 5 MG PO TABS: 5.0000 mg | ORAL_TABLET | Freq: Once | ORAL | Status: DC | PRN

## 2023-04-11 MED ORDER — LACTATED RINGERS IV SOLN: INTRAVENOUS | Status: DC

## 2023-04-11 MED ORDER — OXYCODONE HCL 5 MG PO TABS
5.0000 mg | ORAL_TABLET | Freq: Four times a day (QID) | ORAL | 0 refills | Status: DC | PRN
  Filled 2023-04-11: qty 15, 4d supply, fill #0

## 2023-04-11 MED ORDER — CEFAZOLIN SODIUM-DEXTROSE 2-4 GM/100ML-% IV SOLN
INTRAVENOUS | Status: AC
  Filled 2023-04-11: qty 100

## 2023-04-11 MED ORDER — FENTANYL CITRATE (PF) 250 MCG/5ML IJ SOLN
INTRAMUSCULAR | Status: AC
  Filled 2023-04-11: qty 5

## 2023-04-11 MED ORDER — FENTANYL CITRATE (PF) 100 MCG/2ML IJ SOLN: 25.0000 ug | INTRAMUSCULAR | Status: DC | PRN

## 2023-04-11 MED ORDER — MEPERIDINE HCL 25 MG/ML IJ SOLN: 6.2500 mg | INTRAMUSCULAR | Status: DC | PRN

## 2023-04-11 MED ORDER — ACETAMINOPHEN 500 MG PO TABS
ORAL_TABLET | ORAL | Status: AC
  Administered 2023-04-11: 1000 mg via ORAL
  Filled 2023-04-11: qty 2

## 2023-04-11 MED ORDER — ONDANSETRON HCL 4 MG/2ML IJ SOLN: 4.0000 mg | Freq: Once | INTRAMUSCULAR | Status: DC | PRN

## 2023-04-11 MED ORDER — LIDOCAINE 2% (20 MG/ML) 5 ML SYRINGE
INTRAMUSCULAR | Status: DC | PRN
  Administered 2023-04-11: 60 mg via INTRAVENOUS

## 2023-04-11 MED ORDER — DEXAMETHASONE SODIUM PHOSPHATE 10 MG/ML IJ SOLN
INTRAMUSCULAR | Status: AC
  Filled 2023-04-11: qty 1

## 2023-04-11 MED ORDER — ACETAMINOPHEN 325 MG PO TABS: 325.0000 mg | ORAL_TABLET | ORAL | Status: DC | PRN

## 2023-04-11 MED ORDER — LIDOCAINE HCL 1 % IJ SOLN
INTRAMUSCULAR | Status: AC
  Filled 2023-04-11: qty 20

## 2023-04-11 MED ORDER — MIDAZOLAM HCL 2 MG/2ML IJ SOLN
INTRAMUSCULAR | Status: AC
  Filled 2023-04-11: qty 2

## 2023-04-11 MED ORDER — DEXAMETHASONE SODIUM PHOSPHATE 10 MG/ML IJ SOLN
INTRAMUSCULAR | Status: DC | PRN
  Administered 2023-04-11: 5 mg via INTRAVENOUS

## 2023-04-11 MED ORDER — CHLORHEXIDINE GLUCONATE 0.12 % MT SOLN
OROMUCOSAL | Status: AC
  Administered 2023-04-11: 15 mL via OROMUCOSAL
  Filled 2023-04-11: qty 15

## 2023-04-11 MED ORDER — ACETAMINOPHEN 500 MG PO TABS: 1000.0000 mg | ORAL_TABLET | ORAL | Status: AC

## 2023-04-11 MED ORDER — EPHEDRINE SULFATE-NACL 50-0.9 MG/10ML-% IV SOSY
PREFILLED_SYRINGE | INTRAVENOUS | Status: DC | PRN
  Administered 2023-04-11: 10 mg via INTRAVENOUS

## 2023-04-11 MED ORDER — SODIUM CHLORIDE 0.9 % IV SOLN
INTRAVENOUS | Status: DC | PRN
Start: 2023-04-11 — End: 2023-04-11

## 2023-04-11 MED ORDER — 0.9 % SODIUM CHLORIDE (POUR BTL) OPTIME
TOPICAL | Status: DC | PRN
  Administered 2023-04-11: 1000 mL

## 2023-04-11 MED ORDER — CHLORHEXIDINE GLUCONATE 0.12 % MT SOLN: 15.0000 mL | Freq: Once | OROMUCOSAL | Status: AC

## 2023-04-11 MED ORDER — ONDANSETRON HCL 4 MG/2ML IJ SOLN
INTRAMUSCULAR | Status: AC
  Filled 2023-04-11: qty 2

## 2023-04-11 MED ORDER — EPHEDRINE 5 MG/ML INJ
INTRAVENOUS | Status: AC
  Filled 2023-04-11: qty 5

## 2023-04-11 MED ORDER — PROPOFOL 10 MG/ML IV BOLUS
INTRAVENOUS | Status: DC | PRN
  Administered 2023-04-11: 100 mg via INTRAVENOUS
  Administered 2023-04-11: 50 mg via INTRAVENOUS

## 2023-04-11 SURGICAL SUPPLY — 41 items
ADH SKN CLS APL DERMABOND .7 (GAUZE/BANDAGES/DRESSINGS) ×1
APL PRP STRL LF DISP 70% ISPRP (MISCELLANEOUS) ×1
BAG COUNTER SPONGE SURGICOUNT (BAG) ×1 IMPLANT
BAG SPNG CNTER NS LX DISP (BAG)
BINDER BREAST XLRG (GAUZE/BANDAGES/DRESSINGS) IMPLANT
CANISTER SUCT 3000ML PPV (MISCELLANEOUS) IMPLANT
CHLORAPREP W/TINT 26 (MISCELLANEOUS) ×1 IMPLANT
CLIP TI LARGE 6 (CLIP) ×1 IMPLANT
COVER PROBE W GEL 5X96 (DRAPES) ×1 IMPLANT
COVER SURGICAL LIGHT HANDLE (MISCELLANEOUS) ×1 IMPLANT
DERMABOND ADVANCED .7 DNX12 (GAUZE/BANDAGES/DRESSINGS) ×1 IMPLANT
DEVICE DUBIN SPECIMEN MAMMOGRA (MISCELLANEOUS) ×1 IMPLANT
DRAPE CHEST BREAST 15X10 FENES (DRAPES) ×1 IMPLANT
ELECT COATED BLADE 2.86 ST (ELECTRODE) ×1 IMPLANT
ELECT REM PT RETURN 9FT ADLT (ELECTROSURGICAL) ×1
ELECTRODE REM PT RTRN 9FT ADLT (ELECTROSURGICAL) ×1 IMPLANT
GAUZE PAD ABD 8X10 STRL (GAUZE/BANDAGES/DRESSINGS) ×1 IMPLANT
GAUZE SPONGE 4X4 12PLY STRL LF (GAUZE/BANDAGES/DRESSINGS) ×1 IMPLANT
GLOVE BIO SURGEON STRL SZ 6 (GLOVE) ×1 IMPLANT
GLOVE INDICATOR 6.5 STRL GRN (GLOVE) ×1 IMPLANT
GOWN STRL REUS W/ TWL LRG LVL3 (GOWN DISPOSABLE) ×1 IMPLANT
GOWN STRL REUS W/ TWL XL LVL3 (GOWN DISPOSABLE) ×1 IMPLANT
GOWN STRL REUS W/TWL LRG LVL3 (GOWN DISPOSABLE) ×2
GOWN STRL REUS W/TWL XL LVL3 (GOWN DISPOSABLE) ×1
KIT BASIN OR (CUSTOM PROCEDURE TRAY) ×1 IMPLANT
KIT MARKER MARGIN INK (KITS) ×1 IMPLANT
LIGHT WAVEGUIDE WIDE FLAT (MISCELLANEOUS) IMPLANT
NDL HYPO 25GX1X1/2 BEV (NEEDLE) ×1 IMPLANT
NEEDLE HYPO 25GX1X1/2 BEV (NEEDLE) ×1
NS IRRIG 1000ML POUR BTL (IV SOLUTION) IMPLANT
PACK GENERAL/GYN (CUSTOM PROCEDURE TRAY) ×1 IMPLANT
STRIP CLOSURE SKIN 1/2X4 (GAUZE/BANDAGES/DRESSINGS) ×1 IMPLANT
SUT MNCRL AB 4-0 PS2 18 (SUTURE) ×1 IMPLANT
SUT SILK 2 0 SH (SUTURE) IMPLANT
SUT VIC AB 2-0 SH 27 (SUTURE)
SUT VIC AB 2-0 SH 27XBRD (SUTURE) IMPLANT
SUT VIC AB 3-0 SH 27 (SUTURE) ×1
SUT VIC AB 3-0 SH 27X BRD (SUTURE) ×1 IMPLANT
SYR CONTROL 10ML LL (SYRINGE) ×1 IMPLANT
TOWEL GREEN STERILE (TOWEL DISPOSABLE) ×1 IMPLANT
TOWEL GREEN STERILE FF (TOWEL DISPOSABLE) ×1 IMPLANT

## 2023-04-11 NOTE — Anesthesia Procedure Notes (Signed)
Procedure Name: LMA Insertion Date/Time: 04/11/2023 12:10 PM  Performed by: Thomasene Ripple, CRNAPre-anesthesia Checklist: Patient identified, Emergency Drugs available, Suction available and Patient being monitored Patient Re-evaluated:Patient Re-evaluated prior to induction Oxygen Delivery Method: Circle System Utilized Preoxygenation: Pre-oxygenation with 100% oxygen Induction Type: IV induction Ventilation: Mask ventilation without difficulty LMA: LMA inserted LMA Size: 4.0 Number of attempts: 1 Airway Equipment and Method: Bite block Placement Confirmation: positive ETCO2 Tube secured with: Tape Dental Injury: Teeth and Oropharynx as per pre-operative assessment

## 2023-04-11 NOTE — Interval H&P Note (Signed)
History and Physical Interval Note:  04/11/2023 10:52 AM  Ruth Stephenson  has presented today for surgery, with the diagnosis of LEFT BREAST CANCER.  The various methods of treatment have been discussed with the patient and family. After consideration of risks, benefits and other options for treatment, the patient has consented to  Procedure(s): LEFT BREAST SEED LOCALIZED LUMPECTOMY (Left) as a surgical intervention.  [name] history has been reviewed, patient examined, no change in status, stable for surgery.  I have reviewed the patient's chart and labs.  Questions were answered to the patient's satisfaction.     Almond Lint

## 2023-04-11 NOTE — H&P (Signed)
REFERRING PHYSICIAN: Derinda Late  PROVIDER: Matthias Hughs, MD  Care Team: Patient Care Team: Arne Cleveland, MD as PCP - General (Infectious Diseases) Donell Beers Daphene Calamity, MD as Consulting Provider (Surgical Oncology) Iruku, Shawn Stall, MD (Hematology and Oncology) Jonna Coup, MD (Radiation Oncology)   MRN: [id number] DOB: 10/11/1950 DATE OF ENCOUNTER: 03/28/2023  Subjective   Chief Complaint: Breast Cancer   History of Present Illness: Ruth Stephenson is a 72 y.o. female who is seen today as an office consultation at the request of Dr. Al Pimple for evaluation of Breast Cancer .   Patient presents with a new diagnosis of left breast cancer September 2024. Patient had a self detected breast mass. She underwent diagnostic imaging demonstrating a 1.5 cm mass at 2:00. Core needle biopsy was performed showing a grade 2 invasive ductal carcinoma with DCIS. This is hormone receptor positive, HER2 negative, Ki-67 of 5%. There was also a questionably abnormal lymph node on initial imaging, but this looked less abnormal on imaging at the time of biopsy and was very high and difficult to reach.  Of note, the patient has a history of colon cancer in 2014. She underwent surgery by Dr. Johna Sheriff.  Also, the patient has history of HIV and hep C. These have both been treated. She has undetectable HIV viral loads as well as hep C. She does have some cirrhosis. She also has thrombocytopenia and neutropenia.  She was admitted early September 2024 for cellulitis of the right leg.  Family cancer history -mother had some type of cancer, but the patient is not sure what type. She does note that that her mother did receive significant amounts of treatment. Menarche 16 Menopause unknown Parity-P3, first at age 82  Work- stay at home, does a lot of work around [employer] including gardening.   Diagnostic mammogram:/[facility] 03/15/23  ACR Breast Density Category b: There are  scattered areas of fibroglandular density.  FINDINGS: Full field views of both breasts, magnification views of the RIGHT breast and spot compression view of the LEFT breast are performed.  A spiculated mass within the UPPER-OUTER LEFT breast is identified.  No other suspicious abnormalities within either breast noted.  Targeted ultrasound is performed, showing a 1.5 x 1.3 x 1 cm irregular hypoechoic mass at the 2 o'clock position of the LEFT breast 7 cm from the nipple corresponding to the mammographic mass.  A deep LEFT axillary lymph node with borderline cortical thickening of 3.3 mm is noted. No other abnormal appearing LEFT axillary lymph nodes are noted.  IMPRESSION: 1. Highly suspicious 1.5 cm UPPER-OUTER LEFT breast mass. Tissue sampling is recommended. 2. Deep LEFT axillary lymph node with borderline cortical thickening. Attempt at tissue sampling is recommended. 3. No other significant abnormalities.  RECOMMENDATION: Ultrasound-guided LEFT breast biopsy and attempt at ultrasound-guided LEFT axillary lymph node biopsy, which will be scheduled.  I have discussed the findings and recommendations with the patient. If applicable, a reminder letter will be sent to the patient regarding the next appointment.  BI-RADS CATEGORY 5: Highly suggestive of malignancy.  Pathology core needle biopsy: 03/22/23  1. Breast, left, needle core biopsy, 2 o'clock mass, suspect malignancy : INVASIVE MODERATELY DIFFERENTIATED DUCTAL ADENOCARCINOMA, GRADE 2 (3+2+1) DUCTAL CARCINOMA IN SITU, INTERMEDIATE NUCLEAR GRADE, SOLID AND CRIBRIFORM TYPES WITHOUT NECROSIS TUBULE FORMATION: SCORE 3 NUCLEAR PLEOMORPHISM: SCORE 2 MITOTIC COUNT: SCORE 1 TOTAL SCORE: 6 OVERALL GRADE GRADE 2 (6/9) RARE MICROCALCIFICATION PRESENT WITHIN INVASIVE TUMOR NEGATIVE FOR ANGIOLYMPHATIC INVASION TUMOR MEASURES 11 MM  IN GREATEST LINEAR EXTENT   Receptors: The tumor cells are negative for Her2 (1+). Estrogen  Receptor: 95%, POSITIVE, STRONG STAINING INTENSITY Progesterone Receptor: 30%, POSITIVE, STRONG STAINING INTENSITY Proliferation Marker Ki67: 5%   Review of Systems: A complete review of systems was obtained from the patient. I have reviewed this information and discussed as appropriate with the patient. See HPI as well for other ROS. ROS - otherwise negative.   Medical History: Past Medical History:  Diagnosis Date  History of cancer   Patient Active Problem List  Diagnosis  Cancer of right colon (CMS/HHS-HCC)  History of colon cancer, stage II  Malignant neoplasm of upper-outer quadrant of left female breast (CMS/HHS-HCC)  HIV -Prior AIDS criteria or opportunistic infection, currently Asymptomatic (CMS/HHS-HCC)  Cirrhosis of liver without ascites (CMS/HHS-HCC)  Thrombocytopenia, secondary  History of hepatitis C   Past Surgical History:  Procedure Laterality Date  COLON SURGERY N/A 09/23/2012  ENDOSCOPIC CARPAL TUNNEL RELEASE Left 01/17/2014  Left Breast Biopsy Left 03/22/2023    No Known Allergies  Current Outpatient Medications on File Prior to Visit  Medication Sig Dispense Refill  ondansetron (ZOFRAN) 4 MG tablet Take 4 mg by mouth every 6 (six) hours as needed  ginkgo biloba xt/bacopa lf ext (GINKGO BILOBA PLUS, BACOPA, ORAL) Take 1 capsule by mouth once daily  methadone (DOLOPHINE) 10 mg/5 mL solution Take 23 mg by mouth once daily  methadone (METHADOSE) 40 mg disintegrating tablet Take 20 mg by mouth once daily   No current facility-administered medications on file prior to visit.   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   Social Determinants of Health   Financial Resource Strain: Medium Risk (10/03/2022)  Received [facility]  Overall Financial Resource Strain (CARDIA)  Difficulty of Paying Living Expenses: Somewhat hard  Food Insecurity: No Food Insecurity (03/05/2023)  Received from [facility]  Hunger Vital Sign  Worried About Running Out of Food in the Last Year: Never true  Ran Out of Food in the Last Year: Never true  Transportation Needs: No Transportation Needs (03/05/2023)  Received from [facility] - Transportation  Lack of Transportation (Medical): No  Lack of Transportation (Non-Medical): No  Physical Activity: Sufficiently Active (10/03/2022)  [facility] from [facility]  Exercise Vital Sign  Days of Exercise per Week: 7 days  Minutes of Exercise per Session: 40 min  Stress: No Stress Concern Present (10/03/2022)  Received from [facility] Occupational [facility] - Occupational Stress Questionnaire  Feeling of Stress : Not at all  Social Connections: Moderately Isolated (10/03/2022)  Received from [facility]  Social Connection and Isolation Panel [NHANES]  Frequency of Communication with Friends and Family: More than three times a week  Frequency of Social Gatherings with Friends and Family: Three times a week  Attends Religious Services: More than 4 times per year  Active Member of  Clubs or Organizations: No  Attends Banker Meetings: Never  Marital Status: Never married   Objective:   Vitals:  03/28/23 1053  BP: (!) 146/66  Pulse: 63  Resp: 18  Temp: 36.7 C (98 F)  Weight: 93.8 kg (206 lb 14.4 oz)  Height: 160 cm (5\' 3" )   Body mass index is 36.65 kg/m.  Gen: No acute distress. Well  nourished and well groomed.  Neurological: Alert and oriented to person, place, and time. Coordination normal.  Head: Normocephalic and atraumatic.  Eyes: Conjunctivae are normal. Pupils are equal, round, and reactive to light. No scleral icterus.  Neck: Normal range of motion. Neck supple. No tracheal deviation or thyromegaly present.  Cardiovascular: Normal rate, regular rhythm, normal heart sounds and intact distal pulses. Exam reveals no gallop and no friction rub. No murmur heard. Breast: left breast with vaguely palpable mass UOQ around 2 cm. No nipple retraction or nipple discharge, no LAD, no skin dimpling or contour abnormality. Right breast normal. Relatively symmetric in size.  Respiratory: Effort normal. No respiratory distress. No chest wall tenderness. Breath sounds normal. No wheezes, rales or rhonchi.  GI: Soft. Bowel sounds are normal. The abdomen is soft and nontender. There is no rebound and no guarding.  Musculoskeletal: Normal range of motion. Extremities are nontender.  Lymphadenopathy: No cervical, preauricular, postauricular or axillary adenopathy is present Skin: Skin is warm and dry. No rash noted. No diaphoresis. No erythema. No pallor. No clubbing, cyanosis, or edema.  Psychiatric: Normal mood and affect. Behavior is normal. Judgment and thought content normal.   Labs 03/28/23 CBC with WBC 1.7k, platelets 78k CMET near normal other than Gluc 102, Alb 3.4.   Assessment and Plan:   ICD-10-CM  1. History of colon cancer, stage II Z85.038   2. Malignant neoplasm of upper-outer quadrant of left breast in female, estrogen receptor positive (CMS/HHS-HCC) C50.412  Z17.0   3. HIV -Prior AIDS criteria or opportunistic infection, currently Asymptomatic (CMS/HHS-HCC) B20   4. Thrombocytopenia, secondary D69.59   5. History of hepatitis C Z86.19   6. Cirrhosis of liver without ascites, unspecified hepatic cirrhosis type (CMS/HHS-HCC) K74.60    Patient has a new  diagnosis of clinical T1c N0 left breast cancer. This is amenable to breast conservation. The patient is 22 but does have several significant comorbidities. Because of this, we are electing not to do a sentinel lymph node biopsy given favorable pathology and prognostic panel.  Post surgery antihormonal treatment and radiation is recommended. The patient is not sure that she wants to have any additional treatment at this point, but she is amenable to having surgery.  I discussed lumpectomy with the patient. Because the tumor is only vaguely palpable, I do think it would improve our successes at having seed localization. I discussed this with the patient and advised that I would be removing this hardware at the time of surgery.  I discussed the incision type and location and that we would need radiology involved on with a seed marker 1-3 days before surgery.   We discussed the risks bleeding, infection, damage to other structures, need for further procedures/surgeries. We discussed the risk of seroma. The patient was advised if the breast has cancer, we may need to go back to surgery for additional tissue to obtain negative margins or for a lymph node biopsy. The patient was advised that these are the most common complications, but that others can occur as well. I discussed the risk of alteration in breast  contour or size. I discussed risk of chronic pain. There are rare instances of heart/lung issues post op as well as blood clots.   They were advised against taking aspirin or other anti-inflammatory agents/blood thinners the week before surgery.   The risks and benefits of the procedure were described to the patient and she wishes to proceed.   Her risk of wound complications are slightly higher given her thrombocytopenia and her neutropenia. However, platelet count of 78,000 and should be adequate for breast surgery. The patient does not take any blood thinners because of this. She will likely get  repeat CBC preop.  Posting sheet has been submitted and orders have been filled out.

## 2023-04-11 NOTE — Discharge Instructions (Addendum)
[facility] Office Phone Number [phone]  BREAST BIOPSY/ PARTIAL MASTECTOMY: POST OP INSTRUCTIONS  Always review your discharge instruction sheet given to you by the facility where your surgery was performed.  IF YOU HAVE DISABILITY OR FAMILY LEAVE FORMS, YOU MUST BRING THEM TO THE OFFICE FOR PROCESSING.  DO NOT GIVE THEM TO YOUR DOCTOR.  Take 2 tylenol (acetominophen) three times a day for 3 days.  If you still have pain, add ibuprofen with food in between if able to take this (if you have kidney issues or stomach issues, do not take ibuprofen).  If both of those are not enough, add the narcotic pain pill.  If you find you are needing a lot of this overnight after surgery, call the next morning for a refill.    Prescriptions will not be filled after 5pm or on week-ends. Take your usually prescribed medications unless otherwise directed You should eat very light the first 24 hours after surgery, such as soup, crackers, pudding, etc.  Resume your normal diet the day after surgery. Most patients will experience some swelling and bruising in the breast.  Ice packs and a good support bra will help.  Swelling and bruising can take several days to resolve.  It is common to experience some constipation if taking pain medication after surgery.  Increasing fluid intake and taking a stool softener will usually help or prevent this problem from occurring.  A mild laxative (Milk of Magnesia or Miralax) should be taken according to package directions if there are no bowel movements after 48 hours. Unless discharge instructions indicate otherwise, you may remove your bandages 48 hours after surgery, and you may shower at that time.  You may have steri-strips (small skin tapes) in place directly over the incision.  These strips should be left on the skin at least for for 7-10 days.    ACTIVITIES:  You may resume regular daily activities (gradually increasing) beginning the next day.  Wearing a  good support bra or sports bra (or the breast binder) minimizes pain and swelling.  You may have sexual intercourse when it is comfortable. No heavy lifting for 1-2 weeks (not over around 10 pounds).  You may drive when you no longer are taking prescription pain medication, you can comfortably wear a seatbelt, and you can safely maneuver your car and apply brakes. RETURN TO WORK:  __________3-14 days depending on job. _______________ Ruth Stephenson should see your doctor in the office for a follow-up appointment approximately two weeks after your surgery.  Your doctor's nurse will typically make your follow-up appointment when she calls you with your pathology report.  Expect your pathology report 3-4 business days after your surgery.  You may call to check if you do not hear from [place] after three days.   WHEN TO CALL YOUR DOCTOR: Fever over 101.0 Nausea and/or vomiting. Extreme swelling or bruising. Continued bleeding from incision. Increased pain, redness, or drainage from the incision.  The clinic staff is available to answer your questions during regular business hours.  Please don't hesitate to call and ask to speak to one of the nurses for clinical concerns.  If you have a medical emergency, go to the nearest emergency room or call 911.  A surgeon from [facility] is always on call at the hospital.  For further questions, please visit [email or web address]

## 2023-04-11 NOTE — Op Note (Signed)
Left Breast Radioactive seed localized lumpectomy  Indications: This patient presents with history of left breast cancer, upper outer quadrant, cT1cN0, grade 2 invasive ductal carcinoma, receptors + (strong-95%)/+ (strong-30%)/-, Ki 67 5%.   Pre-operative Diagnosis: left breast cancer  Post-operative Diagnosis: Same  Surgeon: Almond Lint   Anesthesia: General endotracheal anesthesia  ASA Class: 3  Procedure Details  The patient was seen in the Holding Room. The risks, benefits, complications, treatment options, and expected outcomes were discussed with the patient. The possibilities of bleeding, infection, the need for additional procedures, failure to diagnose a condition, and creating a complication requiring other procedures or operations were discussed with the patient. The patient concurred with the proposed plan, giving informed consent.  The site of surgery properly noted/marked. The patient was taken to Operating Room # 2, identified, and the procedure verified as left breast seed localized lumpectomy.  The left breast and chest were prepped and draped in standard fashion. A transverse lateral incision was made near the previously placed radioactive seed.  Dissection was carried down around the point of maximum signal intensity. The cautery was used to perform the dissection.   The specimen was inked with the margin marker paint kit.    Specimen radiography confirmed inclusion of the mammographic lesion, the clip, and the seed.  This appeared to be in the center of the specimen. The background signal in the breast was zero. Hemostasis was achieved with cautery.  The cavity was marked with clips on each border other than the anterior border.  The wound was irrigated and closed with 3-0 vicryl interrupted deep dermal sutures and 4-0 monocryl running subcuticular suture.      Sterile dressings were applied. At the end of the operation, all sponge, instrument, and needle counts were  correct.   Findings: Seed, clip in specimen.  posterior margin is pectoralis, anterior margin is skin.   Estimated Blood Loss:  min         Specimens: left breast tissue with seed         Complications:  None; patient tolerated the procedure well.         Disposition: PACU - hemodynamically stable.         Condition: stable

## 2023-04-11 NOTE — Transfer of Care (Signed)
Immediate Anesthesia Transfer of Care Note  Patient: LENIX BENOIST  Procedure(s) Performed: LEFT BREAST SEED LOCALIZED LUMPECTOMY (Left: Breast)  Patient Location: PACU  Anesthesia Type:General  Level of Consciousness: awake, alert , oriented, and patient cooperative  Airway & Oxygen Therapy: Patient Spontanous Breathing  Post-op Assessment: Report given to RN and Post -op Vital signs reviewed and stable  Post vital signs: Reviewed and stable  Last Vitals:  Vitals Value Taken Time  BP 135/60 04/11/23 1311  Temp    Pulse 71 04/11/23 1314  Resp 15 04/11/23 1314  SpO2 91 % 04/11/23 1314  Vitals shown include unfiled device data.  Last Pain:  Vitals:   04/11/23 0848  PainSc: 0-No pain         Complications: No notable events documented.

## 2023-04-11 NOTE — Anesthesia Postprocedure Evaluation (Signed)
Anesthesia Post Note  Patient: Ruth Stephenson  Procedure(s) Performed: LEFT BREAST SEED LOCALIZED LUMPECTOMY (Left: Breast)     Patient location during evaluation: PACU Anesthesia Type: General Level of consciousness: awake and alert Pain management: pain level controlled Vital Signs Assessment: post-procedure vital signs reviewed and stable Respiratory status: spontaneous breathing, nonlabored ventilation, respiratory function stable and patient connected to nasal cannula oxygen Cardiovascular status: blood pressure returned to baseline and stable Postop Assessment: no apparent nausea or vomiting Anesthetic complications: no   No notable events documented.  Last Vitals:  Vitals:   04/11/23 1330 04/11/23 1345  BP: 120/71 115/76  Pulse: 62 66  Resp: 12 20  Temp:  (!) 36.2 C  SpO2: 93% 93%    Last Pain:  Vitals:   04/11/23 1345  PainSc: 3                  Cionna Collantes

## 2023-04-12 ENCOUNTER — Encounter (HOSPITAL_COMMUNITY): Payer: Self-pay | Admitting: General Surgery

## 2023-04-12 ENCOUNTER — Telehealth: Payer: Self-pay | Admitting: Hematology and Oncology

## 2023-04-12 LAB — SURGICAL PATHOLOGY

## 2023-04-12 NOTE — Telephone Encounter (Signed)
Patient is aware of scheduled appointment times/dates

## 2023-04-13 ENCOUNTER — Telehealth: Payer: Self-pay | Admitting: Genetic Counselor

## 2023-04-13 ENCOUNTER — Emergency Department (HOSPITAL_COMMUNITY)
Admission: EM | Admit: 2023-04-13 | Discharge: 2023-04-13 | Disposition: A | Discharge status: Home / Self Care | Payer: Medicare HMO

## 2023-04-13 ENCOUNTER — Other Ambulatory Visit: Payer: Self-pay

## 2023-04-13 ENCOUNTER — Emergency Department (HOSPITAL_BASED_OUTPATIENT_CLINIC_OR_DEPARTMENT_OTHER): Payer: Medicare HMO

## 2023-04-13 ENCOUNTER — Encounter: Payer: Self-pay | Admitting: Genetic Counselor

## 2023-04-13 ENCOUNTER — Encounter (HOSPITAL_COMMUNITY): Payer: Self-pay

## 2023-04-13 DIAGNOSIS — Z21 Asymptomatic human immunodeficiency virus [HIV] infection status: Secondary | ICD-10-CM | POA: Diagnosis not present

## 2023-04-13 DIAGNOSIS — Z1379 Encounter for other screening for genetic and chromosomal anomalies: Secondary | ICD-10-CM | POA: Insufficient documentation

## 2023-04-13 DIAGNOSIS — R8290 Unspecified abnormal findings in urine: Secondary | ICD-10-CM | POA: Diagnosis not present

## 2023-04-13 DIAGNOSIS — G629 Polyneuropathy, unspecified: Secondary | ICD-10-CM | POA: Diagnosis not present

## 2023-04-13 DIAGNOSIS — L03115 Cellulitis of right lower limb: Secondary | ICD-10-CM | POA: Diagnosis not present

## 2023-04-13 DIAGNOSIS — M7989 Other specified soft tissue disorders: Secondary | ICD-10-CM | POA: Diagnosis not present

## 2023-04-13 DIAGNOSIS — M79604 Pain in right leg: Secondary | ICD-10-CM | POA: Diagnosis present

## 2023-04-13 LAB — URINALYSIS, W/ REFLEX TO CULTURE (INFECTION SUSPECTED)
Bilirubin Urine: NEGATIVE
Glucose, UA: NEGATIVE mg/dL
Hgb urine dipstick: NEGATIVE
Ketones, ur: NEGATIVE mg/dL
Nitrite: NEGATIVE
Protein, ur: NEGATIVE mg/dL
Specific Gravity, Urine: 1.023 (ref 1.005–1.030)
Squamous Epithelial / HPF: >50 /[HPF] (ref 0–5)
pH: 5 (ref 5.0–8.0)

## 2023-04-13 LAB — CBC WITH DIFFERENTIAL/PLATELET
Abs Immature Granulocytes: 0.02 10*3/uL (ref 0.00–0.07)
Basophils Absolute: 0 10*3/uL (ref 0.0–0.1)
Basophils Relative: 0 %
Eosinophils Absolute: 0.1 10*3/uL (ref 0.0–0.5)
Eosinophils Relative: 1 %
HCT: 38.4 % (ref 36.0–46.0)
Hemoglobin: 12.4 g/dL (ref 12.0–15.0)
Immature Granulocytes: 0 %
Lymphocytes Relative: 17 %
Lymphs Abs: 1.1 10*3/uL (ref 0.7–4.0)
MCH: 34.1 pg — ABNORMAL HIGH (ref 26.0–34.0)
MCHC: 32.3 g/dL (ref 30.0–36.0)
MCV: 105.5 fL — ABNORMAL HIGH (ref 80.0–100.0)
Monocytes Absolute: 0.9 10*3/uL (ref 0.1–1.0)
Monocytes Relative: 14 %
Neutro Abs: 4.4 10*3/uL (ref 1.7–7.7)
Neutrophils Relative %: 68 %
Platelets: 75 10*3/uL — ABNORMAL LOW (ref 150–400)
RBC: 3.64 MIL/uL — ABNORMAL LOW (ref 3.87–5.11)
RDW: 13.3 % (ref 11.5–15.5)
WBC: 6.5 10*3/uL (ref 4.0–10.5)
nRBC: 0 % (ref 0.0–0.2)

## 2023-04-13 LAB — COMPREHENSIVE METABOLIC PANEL
ALT: 14 U/L (ref 0–44)
AST: 22 U/L (ref 15–41)
Albumin: 3.3 g/dL — ABNORMAL LOW (ref 3.5–5.0)
Alkaline Phosphatase: 70 U/L (ref 38–126)
Anion gap: 7 (ref 5–15)
BUN: 16 mg/dL (ref 8–23)
CO2: 25 mmol/L (ref 22–32)
Calcium: 8.9 mg/dL (ref 8.9–10.3)
Chloride: 105 mmol/L (ref 98–111)
Creatinine, Ser: 0.79 mg/dL (ref 0.44–1.00)
GFR, Estimated: >60 mL/min (ref >60)
Glucose, Bld: 147 mg/dL — ABNORMAL HIGH (ref 70–99)
Potassium: 4.2 mmol/L (ref 3.5–5.1)
Sodium: 137 mmol/L (ref 135–145)
Total Bilirubin: 1.6 mg/dL — ABNORMAL HIGH (ref 0.3–1.2)
Total Protein: 7.4 g/dL (ref 6.5–8.1)

## 2023-04-13 LAB — LACTIC ACID, PLASMA: Lactic Acid, Venous: 1 mmol/L (ref 0.5–1.9)

## 2023-04-13 MED ORDER — DEXTROSE 5 % IV SOLN
1500.0000 mg | Freq: Once | INTRAVENOUS | Status: AC
  Administered 2023-04-13: 1500 mg via INTRAVENOUS
  Filled 2023-04-13: qty 75

## 2023-04-13 MED ORDER — OXYCODONE-ACETAMINOPHEN 5-325 MG PO TABS
1.0000 | ORAL_TABLET | Freq: Once | ORAL | Status: AC
  Administered 2023-04-13: 1 via ORAL
  Filled 2023-04-13: qty 1

## 2023-04-13 NOTE — Discharge Instructions (Addendum)
You were seen in the ER today for leg redness, swelling, and pain. You have cellulitis in your right lower leg and no signs of a blood clot. Your labs were normal at this time so you were given a one time dose of Dalvance to treat the cellulitis. Please return to the ER if the redness, swelling, pain, or area of redness is worsening or if you have fever or chills develop as this could be signs of worsening infection.

## 2023-04-13 NOTE — Telephone Encounter (Signed)
I contacted Ruth Stephenson to discuss her genetic testing results. No pathogenic variants were identified in the 71 genes analyzed. 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 Detailed clinic note to follow.  The test report has been scanned into EPIC and is located under the Molecular Pathology section of the Results Review tab.  A portion of the result report is included below for reference.   Ruth Brothers, MS, [facility] Genetic Counselor [email or web address] (P) [phone]

## 2023-04-13 NOTE — ED Provider Notes (Signed)
[place] EMERGENCY DEPARTMENT AT [facility] Provider Note   CSN: [id number] Arrival date & time: 04/13/23  1615     History Chief Complaint  Patient presents with   Leg Pain    Ruth Stephenson is a 72 y.o. female.  Patient with past history significant for HIV/AIDS, neuropathy, bilateral lower extremity edema, cirrhosis of liver and thrombocytopenia who presents to the emergency department concerns of leg pain.  Reports that she began to notice some swelling and redness in the right lower extremity yesterday.  Endorses that the pain has been worsening due to swelling in the leg.  He was hospitalized about 6 weeks ago for outpatient failure of antibiotic therapy for cellulitis in the same leg.  States that her symptoms had resolved after being treated with IV antibiotics for a few days during admission.  States that symptoms began yesterday and have been worsening.  Denies any fevers or chills.  No body aches.   Leg Pain      Home Medications Prior to Admission medications   Medication Sig Start Date End Date Taking? Authorizing Provider  acetaminophen (TYLENOL) 325 MG tablet Take 2 tablets (650 mg total) by mouth every 6 (six) hours as needed for mild pain (or Fever >/= 101). Patient not taking: Reported on 04/04/2023 03/01/23   Marguerita Merles Latif, DO  bisacodyl (DULCOLAX) 5 MG EC tablet Take 2 tablets (10 mg total) by mouth daily. Patient not taking: Reported on 04/04/2023 03/02/23   Marguerita Merles Latif, DO  Cholecalciferol (VITAMIN D3) 50 MCG (2000 UT) capsule Take 2,000 Units by mouth daily.    [provider]  DOVATO 50-300 MG tablet TAKE 1 TABLET BY MOUTH EVERY DAY 11/29/22   [name], MD  [facility] BILOBA PLUS PO Take 120 mg by mouth daily.    [provider]  Menatetrenone (VITAMIN K2) 100 MCG TABS Take 100 mg by mouth daily.    [provider]  methadone (DOLOPHINE) 10 MG/5ML solution Take 23 mg by mouth in the morning.    [provider]  ondansetron (ZOFRAN) 4 MG tablet Take 1 tablet (4 mg total) by mouth every 6 (six) hours as needed for nausea. Patient not taking: Reported on 04/04/2023 03/01/23   Marguerita Merles Latif, DO  oxyCODONE (OXY IR/ROXICODONE) 5 MG immediate release tablet Take 1 tablet (5 mg total) by mouth every 6 (six) hours as needed for severe pain (pain score 7-10). 04/11/23   Almond Lint, MD      Allergies    Patient has no known allergies.    Review of Systems   Review of Systems  Musculoskeletal:        Leg pain  Skin:  Positive for color change.  All other systems reviewed and are negative.   Physical Exam Updated Vital Signs BP 130/72   Pulse 74   Temp 98.4 F (36.9 C)   Resp 18   Ht 5\' 3"  (1.6 m)   Wt 90.7 kg   SpO2 96%   BMI 35.43 kg/m  Physical Exam Vitals and nursing note reviewed.  Constitutional:      General: She is not in acute distress.    Appearance: She is well-developed.  HENT:     Head: Normocephalic and atraumatic.  Eyes:     Conjunctiva/sclera: Conjunctivae normal.  Cardiovascular:     Rate and Rhythm: Normal rate and regular rhythm.     Heart sounds: No murmur heard. Pulmonary:     Effort:  Pulmonary effort is normal. No respiratory distress.     Breath sounds: Normal breath sounds.  Abdominal:     Palpations: Abdomen is soft.     Tenderness: There is no abdominal tenderness.  Musculoskeletal:        General: Tenderness present. No swelling.     Cervical back: Neck supple.     Right lower leg: Edema present.  Skin:    General: Skin is warm and dry.     Capillary Refill: Capillary refill takes less than 2 seconds.     Findings: Erythema present.     Comments: See picture below.  Neurological:     Mental Status: She is alert.  Psychiatric:        Mood and Affect: Mood normal.     ED Results / Procedures / Treatments   Labs (all labs ordered are listed, but only abnormal results are displayed) Labs Reviewed  COMPREHENSIVE METABOLIC  PANEL - Abnormal; Notable for the following components:      Result Value   Glucose, Bld 147 (*)    Albumin 3.3 (*)    Total Bilirubin 1.6 (*)    All other components within normal limits  URINALYSIS, W/ REFLEX TO CULTURE (INFECTION SUSPECTED) - Abnormal; Notable for the following components:   Color, Urine AMBER (*)    APPearance CLOUDY (*)    Leukocytes,Ua SMALL (*)    Bacteria, UA RARE (*)    All other components within normal limits  CBC WITH DIFFERENTIAL/PLATELET - Abnormal; Notable for the following components:   RBC 3.64 (*)    MCV 105.5 (*)    MCH 34.1 (*)    Platelets 75 (*)    All other components within normal limits  URINE CULTURE  LACTIC ACID, PLASMA  CBC WITH DIFFERENTIAL/PLATELET  LACTIC ACID, PLASMA    EKG None  Radiology VAS [place] LOWER EXTREMITY VENOUS (DVT) (7a-7p)  Result Date: 04/13/2023  Lower Venous DVT Study Patient Name:  Ruth Stephenson  Date of Exam:   04/13/2023 Medical Rec #: [id number]        Accession #:    [id number] Date of Birth: 03-20-1951        Patient Gender: F Patient Age:   41 years Exam Location:  [facility] Procedure:      VAS [place] LOWER EXTREMITY VENOUS (DVT) Referring Phys: ADAM CURATOLO --------------------------------------------------------------------------------  Indications: Pain, Swelling, and Patient presents with redness , swelling and pain in her right calf extending up into her mid thigh area. Other Indications: Breast surgery 2 days ago. Comparison Study: 02/27/23 - Negative Performing Technologist: Marilynne Halsted RDMS, RVT  Examination Guidelines: A complete evaluation includes B-mode imaging, spectral Doppler, color Doppler, and power Doppler as needed of all accessible portions of each vessel. Bilateral testing is considered an integral part of a complete examination. Limited examinations for reoccurring indications may be performed as noted. The reflux portion of the exam is performed with the patient in reverse  Trendelenburg.  +---------+---------------+---------+-----------+----------+--------------+ RIGHT    CompressibilityPhasicitySpontaneityPropertiesThrombus Aging +---------+---------------+---------+-----------+----------+--------------+ CFV      Full           Yes      Yes                                 +---------+---------------+---------+-----------+----------+--------------+ SFJ      Full                                                        +---------+---------------+---------+-----------+----------+--------------+  FV Prox  Full                                                        +---------+---------------+---------+-----------+----------+--------------+ FV Mid   Full                                                        +---------+---------------+---------+-----------+----------+--------------+ FV DistalFull                                                        +---------+---------------+---------+-----------+----------+--------------+ PFV      Full                                                        +---------+---------------+---------+-----------+----------+--------------+ POP      Full           Yes      Yes                                 +---------+---------------+---------+-----------+----------+--------------+ PTV      Full                                                        +---------+---------------+---------+-----------+----------+--------------+ PERO     Full                                                        +---------+---------------+---------+-----------+----------+--------------+   +----+---------------+---------+-----------+----------+--------------+ LEFTCompressibilityPhasicitySpontaneityPropertiesThrombus Aging +----+---------------+---------+-----------+----------+--------------+ CFV Full           Yes      Yes                                  +----+---------------+---------+-----------+----------+--------------+ SFJ Full                                                        +----+---------------+---------+-----------+----------+--------------+    Summary: RIGHT: - There is no evidence of deep vein thrombosis in the lower extremity.  - No cystic structure found in the popliteal fossa. - Ultrasound characteristics of enlarged lymph nodes are noted in the groin.  LEFT: - No evidence of common femoral vein obstruction.   *See table(s) above for measurements and observations.  Preliminary     Procedures Procedures   Medications Ordered in ED Medications  dalbavancin (DALVANCE) 1,500 mg in dextrose 5 % 500 mL IVPB (1,500 mg Intravenous New Bag/Given 04/13/23 2221)    ED Course/ Medical Decision Making/ A&P                               Medical Decision Making Amount and/or Complexity of Data Reviewed Labs: ordered.   This patient presents to the ED for concern of leg pain.  Differential diagnosis includes DVT, cellulitis, sepsis, vascular insufficiency    Additional history obtained:  Additional history obtained from ED to admission on 02/26/2023 External records from outside source obtained and reviewed including hospitalization for cellulitis of right lower extremity after outpatient antibiotic failure   Lab Tests:  I Ordered, and personally interpreted labs.  The pertinent results include: CMP is unremarkable, CBC with no signs of leukocytosis, lactic acid unremarkable at 1.0, UA with possible signs of infection with small leukocytes and rare bacteria although patient is asymptomatic   Imaging Studies ordered:  I ordered imaging studies including vascular ultrasound of lower extremity I independently visualized and interpreted imaging which showed no evidence of DVT I agree with the radiologist interpretation   Medicines ordered and prescription drug management:  I ordered medication including Dalvance for  cellulitis Reevaluation of the patient after these medicines showed that the patient improved I have reviewed the patients home medicines and have made adjustments as needed   Problem List / ED Course:  Patient presents to the emergency room with history significant for HIV/AIDS, GERD, neuropathy, and bilateral lower extremity edema here with concerns of leg pain, swelling and redness.  Was admitted to the hospital about 6 weeks ago for cellulitis of the right lower extremity.  States that redness began to develop yesterday and has been worsening.  Notable pain in this leg but denies any symptoms consistent with claudication. Right lower extremity is warm to the touch from about the distal foot to the knee.  States that it does not spread much beyond this since starting yesterday.  No fever or chills.  Suspect likely cellulitis but will gather labs.  Given patient's recent admission for similar symptoms, leading towards likely admission at this time.  Patient not acutely meeting SIRS criteria and is not febrile, tachycardic, or tachypneic. Patient's labs are surprisingly unremarkable at this time.  There is no leukocytosis or lactic acidosis.  Patient still not meeting SIRS criteria code sepsis.  Will patient was recently admitted for cellulitis, will trial a course of Dalvance in an effort to avoid hospitalization.  Patient is agreeable with this plan.  Dalvance ordered pending pharmacy consult. Dalvance infusion started. Patient tolerated Dalvance without difficulties. Advised patient strict return precautions if the redness, pain, or swelling worsens or if she were to develop fever or chills. Patient otherwise stable for discharge and outpatient follow up as needed. Patient discharged in stable condition.  Final Clinical Impression(s) / ED Diagnoses Final diagnoses:  Cellulitis of right lower extremity    Rx / DC Orders ED Discharge Orders          Ordered    Ambulatory referral to  Infectious Disease       Comments: Cellulitis patient:  Received dalbavancin on 04/13/2023.   04/13/23 2152              Smitty Knudsen, PA-C 04/13/23 2254    Beckey Downing  R, MD 04/13/23 4401

## 2023-04-13 NOTE — ED Triage Notes (Signed)
Pt complaining of redness, and swelling on the outside of her right leg. Pt has been seen for this complaint in the past, and was hospitalized for it, but unsure of the DX. Pt says this time she noticed her skin was very thin and noticed some bleeding in the areas that have flared up. Pt underwent surgery apprx 2x days ago for breast cancer.

## 2023-04-15 LAB — URINE CULTURE: Culture: NO GROWTH

## 2023-04-16 DIAGNOSIS — F1120 Opioid dependence, uncomplicated: Secondary | ICD-10-CM | POA: Diagnosis not present

## 2023-04-17 ENCOUNTER — Encounter: Payer: Self-pay | Admitting: *Deleted

## 2023-04-17 ENCOUNTER — Telehealth: Payer: Self-pay | Admitting: *Deleted

## 2023-04-17 NOTE — Telephone Encounter (Signed)
Received order for oncotype testing. Requisition sent to pathology 

## 2023-04-19 ENCOUNTER — Inpatient Hospital Stay (HOSPITAL_COMMUNITY)
Admission: EM | Admit: 2023-04-19 | Discharge: 2023-04-22 | DRG: 603 | Disposition: A | Discharge status: Home / Self Care | Payer: Medicare HMO | Attending: Internal Medicine | Admitting: Internal Medicine

## 2023-04-19 ENCOUNTER — Other Ambulatory Visit: Payer: Self-pay

## 2023-04-19 ENCOUNTER — Emergency Department (HOSPITAL_COMMUNITY): Payer: Medicare HMO

## 2023-04-19 ENCOUNTER — Encounter (HOSPITAL_COMMUNITY): Payer: Self-pay | Admitting: Emergency Medicine

## 2023-04-19 ENCOUNTER — Telehealth: Payer: Self-pay | Admitting: *Deleted

## 2023-04-19 DIAGNOSIS — Z66 Do not resuscitate: Secondary | ICD-10-CM | POA: Diagnosis present

## 2023-04-19 DIAGNOSIS — Z91148 Patient's other noncompliance with medication regimen for other reason: Secondary | ICD-10-CM

## 2023-04-19 DIAGNOSIS — Z9049 Acquired absence of other specified parts of digestive tract: Secondary | ICD-10-CM | POA: Diagnosis not present

## 2023-04-19 DIAGNOSIS — L0390 Cellulitis, unspecified: Secondary | ICD-10-CM | POA: Diagnosis not present

## 2023-04-19 DIAGNOSIS — E66812 Obesity, class 2: Secondary | ICD-10-CM | POA: Diagnosis present

## 2023-04-19 DIAGNOSIS — M79604 Pain in right leg: Secondary | ICD-10-CM | POA: Diagnosis not present

## 2023-04-19 DIAGNOSIS — F17210 Nicotine dependence, cigarettes, uncomplicated: Secondary | ICD-10-CM | POA: Diagnosis present

## 2023-04-19 DIAGNOSIS — L03115 Cellulitis of right lower limb: Principal | ICD-10-CM | POA: Diagnosis present

## 2023-04-19 DIAGNOSIS — Z853 Personal history of malignant neoplasm of breast: Secondary | ICD-10-CM

## 2023-04-19 DIAGNOSIS — I878 Other specified disorders of veins: Secondary | ICD-10-CM | POA: Diagnosis present

## 2023-04-19 DIAGNOSIS — D696 Thrombocytopenia, unspecified: Secondary | ICD-10-CM | POA: Diagnosis present

## 2023-04-19 DIAGNOSIS — M1711 Unilateral primary osteoarthritis, right knee: Secondary | ICD-10-CM | POA: Diagnosis not present

## 2023-04-19 DIAGNOSIS — Z8616 Personal history of COVID-19: Secondary | ICD-10-CM | POA: Diagnosis not present

## 2023-04-19 DIAGNOSIS — Z6835 Body mass index (BMI) 35.0-35.9, adult: Secondary | ICD-10-CM | POA: Diagnosis not present

## 2023-04-19 DIAGNOSIS — Z79899 Other long term (current) drug therapy: Secondary | ICD-10-CM

## 2023-04-19 DIAGNOSIS — D709 Neutropenia, unspecified: Secondary | ICD-10-CM | POA: Diagnosis not present

## 2023-04-19 DIAGNOSIS — Z8619 Personal history of other infectious and parasitic diseases: Secondary | ICD-10-CM

## 2023-04-19 DIAGNOSIS — F1110 Opioid abuse, uncomplicated: Secondary | ICD-10-CM | POA: Diagnosis present

## 2023-04-19 DIAGNOSIS — Z808 Family history of malignant neoplasm of other organs or systems: Secondary | ICD-10-CM | POA: Diagnosis not present

## 2023-04-19 DIAGNOSIS — R600 Localized edema: Secondary | ICD-10-CM | POA: Diagnosis not present

## 2023-04-19 DIAGNOSIS — Z85038 Personal history of other malignant neoplasm of large intestine: Secondary | ICD-10-CM | POA: Diagnosis not present

## 2023-04-19 DIAGNOSIS — Z825 Family history of asthma and other chronic lower respiratory diseases: Secondary | ICD-10-CM

## 2023-04-19 DIAGNOSIS — M7989 Other specified soft tissue disorders: Secondary | ICD-10-CM | POA: Diagnosis not present

## 2023-04-19 LAB — CBC WITH DIFFERENTIAL/PLATELET
Abs Immature Granulocytes: 0.02 10*3/uL (ref 0.00–0.07)
Basophils Absolute: 0 10*3/uL (ref 0.0–0.1)
Basophils Relative: 0 %
Eosinophils Absolute: 0 10*3/uL (ref 0.0–0.5)
Eosinophils Relative: 2 %
HCT: 36.4 % (ref 36.0–46.0)
Hemoglobin: 11.7 g/dL — ABNORMAL LOW (ref 12.0–15.0)
Immature Granulocytes: 1 %
Lymphocytes Relative: 40 %
Lymphs Abs: 1 10*3/uL (ref 0.7–4.0)
MCH: 33.7 pg (ref 26.0–34.0)
MCHC: 32.1 g/dL (ref 30.0–36.0)
MCV: 104.9 fL — ABNORMAL HIGH (ref 80.0–100.0)
Monocytes Absolute: 0.3 10*3/uL (ref 0.1–1.0)
Monocytes Relative: 13 %
Neutro Abs: 1.1 10*3/uL — ABNORMAL LOW (ref 1.7–7.7)
Neutrophils Relative %: 44 %
Platelets: 102 10*3/uL — ABNORMAL LOW (ref 150–400)
RBC: 3.47 MIL/uL — ABNORMAL LOW (ref 3.87–5.11)
RDW: 12.9 % (ref 11.5–15.5)
WBC: 2.4 10*3/uL — ABNORMAL LOW (ref 4.0–10.5)
nRBC: 0 % (ref 0.0–0.2)

## 2023-04-19 LAB — BASIC METABOLIC PANEL
Anion gap: 7 (ref 5–15)
BUN: 14 mg/dL (ref 8–23)
CO2: 27 mmol/L (ref 22–32)
Calcium: 8.6 mg/dL — ABNORMAL LOW (ref 8.9–10.3)
Chloride: 102 mmol/L (ref 98–111)
Creatinine, Ser: 0.83 mg/dL (ref 0.44–1.00)
GFR, Estimated: >60 mL/min (ref >60)
Glucose, Bld: 79 mg/dL (ref 70–99)
Potassium: 4 mmol/L (ref 3.5–5.1)
Sodium: 136 mmol/L (ref 135–145)

## 2023-04-19 LAB — I-STAT CG4 LACTIC ACID, ED: Lactic Acid, Venous: 1.1 mmol/L (ref 0.5–1.9)

## 2023-04-19 MED ORDER — ACETAMINOPHEN 500 MG PO TABS
1000.0000 mg | ORAL_TABLET | Freq: Four times a day (QID) | ORAL | Status: DC | PRN
  Administered 2023-04-20: 1000 mg via ORAL
  Filled 2023-04-19: qty 2

## 2023-04-19 MED ORDER — IOHEXOL 300 MG/ML  SOLN
100.0000 mL | Freq: Once | INTRAMUSCULAR | Status: AC | PRN
  Administered 2023-04-19: 100 mL via INTRAVENOUS

## 2023-04-19 MED ORDER — OXYCODONE HCL 5 MG PO TABS: 2.5000 mg | ORAL_TABLET | Freq: Four times a day (QID) | ORAL | Status: DC | PRN

## 2023-04-19 MED ORDER — HYDROCODONE-ACETAMINOPHEN 5-325 MG PO TABS
1.0000 | ORAL_TABLET | Freq: Once | ORAL | Status: AC
  Administered 2023-04-19: 1 via ORAL
  Filled 2023-04-19: qty 1

## 2023-04-19 MED ORDER — ENOXAPARIN SODIUM 40 MG/0.4ML IJ SOSY
40.0000 mg | PREFILLED_SYRINGE | INTRAMUSCULAR | Status: DC
  Administered 2023-04-20 – 2023-04-22 (×3): 40 mg via SUBCUTANEOUS
  Filled 2023-04-19 (×3): qty 0.4

## 2023-04-19 MED ORDER — FENTANYL CITRATE PF 50 MCG/ML IJ SOSY
50.0000 ug | PREFILLED_SYRINGE | INTRAMUSCULAR | Status: DC | PRN
  Administered 2023-04-19: 50 ug via INTRAVENOUS
  Filled 2023-04-19: qty 1

## 2023-04-19 MED ORDER — DOLUTEGRAVIR-LAMIVUDINE 50-300 MG PO TABS
1.0000 | ORAL_TABLET | Freq: Every day | ORAL | Status: DC
  Administered 2023-04-20 – 2023-04-22 (×3): 1 via ORAL
  Filled 2023-04-19 (×4): qty 1

## 2023-04-19 MED ORDER — VANCOMYCIN HCL 2000 MG/400ML IV SOLN
2000.0000 mg | Freq: Once | INTRAVENOUS | Status: AC
  Administered 2023-04-19: 2000 mg via INTRAVENOUS
  Filled 2023-04-19: qty 400

## 2023-04-19 MED ORDER — SODIUM CHLORIDE 0.9 % IV BOLUS
500.0000 mL | Freq: Once | INTRAVENOUS | Status: AC
  Administered 2023-04-19: 500 mL via INTRAVENOUS

## 2023-04-19 MED ORDER — METHADONE HCL 10 MG/ML PO CONC
23.0000 mg | Freq: Every morning | ORAL | Status: DC
  Administered 2023-04-20 (×2): 23 mg via ORAL
  Filled 2023-04-19 (×2): qty 5

## 2023-04-19 MED ORDER — OXYCODONE HCL 5 MG PO TABS
5.0000 mg | ORAL_TABLET | Freq: Four times a day (QID) | ORAL | Status: DC | PRN
  Administered 2023-04-20: 5 mg via ORAL
  Filled 2023-04-19: qty 1

## 2023-04-19 MED ORDER — VANCOMYCIN HCL IN DEXTROSE 1-5 GM/200ML-% IV SOLN: 1000.0000 mg | INTRAVENOUS | Status: DC

## 2023-04-19 NOTE — ED Notes (Signed)
Pt extremely reactive to attempts to place an IV.

## 2023-04-19 NOTE — ED Triage Notes (Signed)
Patient arrives in wheelchair by POV c/o right leg cellulitis. Patient states she was recently admitted to hospital and treated with antibiotics. States her leg started weeping and is now busting whenever it is touched. Patient states she is concerned that it is MRSA.

## 2023-04-19 NOTE — ED Notes (Signed)
ED TO INPATIENT HANDOFF REPORT  ED Nurse Name and Phone #: Adel Neyer RN  S Name/Age/Gender Ruth Stephenson 72 y.o. female Room/Bed: WA14/WA14  Code Status   Code Status: Limited: Do not attempt resuscitation (DNR) -DNR-LIMITED -Do Not Intubate/DNI   Home/SNF/Other Home Patient oriented to: self, place, time, and situation Is this baseline? Yes   Triage Complete: Triage complete  Chief Complaint Cellulitis of leg, right [L03.115]  Triage Note Patient arrives in wheelchair by POV c/o right leg cellulitis. Patient states she was recently admitted to hospital and treated with antibiotics. States her leg started weeping and is now busting whenever it is touched. Patient states she is concerned that it is MRSA.     Allergies No Known Allergies  Level of Care/Admitting Diagnosis ED Disposition     ED Disposition  Admit   Condition  --   Comment  Hospital Area: [facility] [[id number]]  Level of Care: Med-Surg [16]  May admit patient to [facility] or [facility] if equivalent level of care is available:: No  Covid Evaluation: Asymptomatic - no recent exposure (last 10 days) testing not required  Diagnosis: Cellulitis of leg, right [161096]  Admitting Physician: Dolly Rias [[id number]]  Attending Physician: Dolly Rias [[id number]]  Certification:: I certify this patient will need inpatient services for at least 2 midnights          B Medical/Surgery History Past Medical History:  Diagnosis Date   Angio-edema    Breast cancer (HCC)    Cancer (HCC)    Cancer of right colon (HCC) 10/24/2012   Discussed with h/o- no need for further f/u (08-2020)     Cirrhosis of liver (HCC)    had a liver u/s in 2014 that showed that she was cirrhotic.   Hepatitis C    treated ([place]) 2015   History of COVID-19 06/03/2020   HIV (human immunodeficiency virus infection) (HCC)    Pneumonia 03/15/2021   in hospital 02-2021 with Pneumococcal pna and  non-adherence to meds.   Polysubstance abuse (HCC)    Tuberculosis    neg chest xray   Past Surgical History:  Procedure Laterality Date   BREAST BIOPSY Left 03/22/2023   [place] LT BREAST BX W LOC DEV 1ST LESION IMG BX SPEC US GUIDE 03/22/2023 [facility] MAMMOGRAPHY   BREAST BIOPSY Left 04/10/2023   [place] LT RADIOACTIVE SEED LOC 04/10/2023 [facility] ULTRASOUND   BREAST LUMPECTOMY WITH RADIOACTIVE SEED LOCALIZATION Left 04/11/2023   Procedure: LEFT BREAST SEED LOCALIZED LUMPECTOMY;  Surgeon: Almond Lint, MD;  Location: [facility];  Service: General;  Laterality: Left;   CARPAL TUNNEL RELEASE Left 01/19/2014   Procedure: LEFT CARPAL TUNNEL RELEASE;  Surgeon: Marlowe Shores, MD;  Location: [facility];  Service: Orthopedics;  Laterality: Left;   COLON RESECTION N/A 09/23/2012   Procedure: LAPAROSCOPIC RIGHT COLON RESECTION   ;  Surgeon: Mariella Saa, MD;  Location: [facility];  Service: General;  Laterality: N/A;  LAPOROSCOPY, RIGHT HEMICOLECTOMY   COLON SURGERY  09/23/2012   lap right colectomy   COLONOSCOPY N/A 09/20/2012   Procedure: COLONOSCOPY;  Surgeon: Theda Belfast, MD;  Location: [facility];  Service: Endoscopy;  Laterality: N/A;   OPEN REDUCTION INTERNAL FIXATION (ORIF) DISTAL RADIAL FRACTURE Left 01/19/2014   Procedure: OPEN REDUCTION INTERNAL FIXATION (ORIF) LEFT  DISTAL RADIAL FRACTURE;  Surgeon: Marlowe Shores, MD;  Location: [facility];  Service: Orthopedics;  Laterality: Left;     A IV Location/Drains/Wounds  Patient Lines/Drains/Airways Status     Active Line/Drains/Airways     Name Placement date Placement time Site Days   Peripheral IV 04/19/23 22 G 1" Right Antecubital 04/19/23  1835  Antecubital  less than 1            Intake/Output Last 24 hours No intake or output data in the 24 hours ending 04/19/23 2356  Labs/Imaging Results for orders placed or performed during the hospital encounter of 04/19/23 (from the past 48 hour(s))  Basic  metabolic panel     Status: Abnormal   Collection Time: 04/19/23  6:50 PM  Result Value Ref Range   Sodium 136 135 - 145 mmol/L   Potassium 4.0 3.5 - 5.1 mmol/L   Chloride 102 98 - 111 mmol/L   CO2 27 22 - 32 mmol/L   Glucose, Bld 79 70 - 99 mg/dL    Comment: Glucose reference range applies only to samples taken after fasting for at least 8 hours.   BUN 14 8 - 23 mg/dL   Creatinine, Ser 3.08 0.44 - 1.00 mg/dL   Calcium 8.6 (L) 8.9 - 10.3 mg/dL   GFR, Estimated >65 >78 mL/min    Comment: (NOTE) Calculated using the CKD-EPI Creatinine Equation (2021)    Anion gap 7 5 - 15    Comment: Performed at [facility], [address], [place], [place] [address]  I-Stat Lactic Acid     Status: None   Collection Time: 04/19/23  7:01 PM  Result Value Ref Range   Lactic Acid, Venous 1.1 0.5 - 1.9 mmol/L  CBC with Differential/Platelet     Status: Abnormal   Collection Time: 04/19/23  8:40 PM  Result Value Ref Range   WBC 2.4 (L) 4.0 - 10.5 K/uL   RBC 3.47 (L) 3.87 - 5.11 MIL/uL   Hemoglobin 11.7 (L) 12.0 - 15.0 g/dL   HCT 95.2 84.1 - 32.4 %   MCV 104.9 (H) 80.0 - 100.0 fL   MCH 33.7 26.0 - 34.0 pg   MCHC 32.1 30.0 - 36.0 g/dL   RDW 40.1 02.7 - 25.3 %   Platelets 102 (L) 150 - 400 K/uL   nRBC 0.0 0.0 - 0.2 %   Neutrophils Relative % 44 %   Neutro Abs 1.1 (L) 1.7 - 7.7 K/uL   Lymphocytes Relative 40 %   Lymphs Abs 1.0 0.7 - 4.0 K/uL   Monocytes Relative 13 %   Monocytes Absolute 0.3 0.1 - 1.0 K/uL   Eosinophils Relative 2 %   Eosinophils Absolute 0.0 0.0 - 0.5 K/uL   Basophils Relative 0 %   Basophils Absolute 0.0 0.0 - 0.1 K/uL   Immature Granulocytes 1 %   Abs Immature Granulocytes 0.02 0.00 - 0.07 K/uL    Comment: Performed at [facility], [address], [place], [place] [address]   CT TIBIA FIBULA RIGHT W CONTRAST  Result Date: 04/19/2023 CLINICAL DATA:  Right lower extremity soft tissue infection EXAM: CT OF THE LOWER RIGHT EXTREMITY  WITH CONTRAST TECHNIQUE: Multidetector CT imaging of the lower right extremity was performed according to the standard protocol following intravenous contrast administration. RADIATION DOSE REDUCTION: This exam was performed according to the departmental dose-optimization program which includes automated exposure control, adjustment of the mA and/or kV according to patient size and/or use of iterative reconstruction technique. CONTRAST:  OMNIPAQUE IOHEXOL 300 MG/ML  SOLN COMPARISON:  None Available. FINDINGS: Bones/Joint/Cartilage Normal alignment. No acute fracture or dislocation. 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 Joint spaces of the right ankle and visualized right hindfoot are preserved. No lytic or blastic bone lesion. No erosions or abnormal periosteal reaction. Ligaments Suboptimally assessed by CT. Muscles and Tendons Unremarkable Soft tissues Mild prepatellar edema. There is circumferential dermal thickening and subcutaneous edema, asymmetrically more severe along the medial aspect of the distal right foreleg at the level of the distal diaphysis of the tibia. No loculated subcutaneous fluid collection identified. Numerous superficial varicosities are identified arising from incompetent perforator veins from the distal posterior tibial vein, lateral gastrocnemius vein and lesser saphenous vein. IMPRESSION: 1. Circumferential dermal thickening and subcutaneous edema, asymmetrically more severe along the medial aspect of the distal right foreleg at the level of the distal diaphysis of the tibia. No loculated subcutaneous fluid collection identified. 2. 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 3. 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 4. Mild prepatellar subcutaneous edema. Electronically Signed   By:  Helyn Numbers M.D.   On: 04/19/2023 23:30   DG Tibia/Fibula Right  Result Date: 04/19/2023 CLINICAL DATA:  Leg pain.  Cellulitis. EXAM: RIGHT TIBIA AND FIBULA - 2 VIEW COMPARISON:  02/20/2023 FINDINGS: There is no evidence of fracture or other focal bone lesions. No erosive change or periostitis. Knee and ankle alignment are maintained with knee degenerative change. Generalized soft tissue edema. No soft tissue gas. Scattered soft tissue phleboliths. No radiopaque foreign body peer IMPRESSION: Generalized soft tissue edema. No soft tissue gas or radiographic evidence of osteomyelitis. Electronically Signed   By: Narda Rutherford M.D.   On: 04/19/2023 22:19    Pending Labs Unresulted Labs (From admission, onward)     Start     Ordered   04/20/23 0500  Hemoglobin A1c  Tomorrow morning,   R        04/19/23 2242   04/20/23 0500  Basic metabolic panel  Tomorrow morning,   R        04/19/23 2242   04/20/23 0500  CBC  Tomorrow morning,   R        04/19/23 2242   04/20/23 0500  Magnesium  Tomorrow morning,   R        04/19/23 2242   04/20/23 0500  Phosphorus  Tomorrow morning,   R        04/19/23 2242   04/20/23 0500  Hepatic function panel  Tomorrow morning,   R        04/19/23 2242   04/20/23 0500  Brain natriuretic peptide  Tomorrow morning,   R        04/19/23 2329   04/19/23 2334  Rapid urine drug screen (hospital performed)  ONCE - STAT,   STAT        04/19/23 2333   04/19/23 1936  T-helper cells (CD4) count (not at [facility])  Once,   R        04/19/23 1936   04/19/23 1620  CBC with Differential/Platelet  Once,   STAT        04/19/23 1620   04/19/23 1539  CBC with Differential  Once,   STAT        04/19/23 1538   04/19/23 1539  Culture, blood (routine x 2)  BLOOD CULTURE X 2,   R (with STAT occurrences)      04/19/23 1538  Vitals/Pain Today's Vitals   04/19/23 1951 04/19/23 2038 04/19/23 2138 04/19/23 2328  BP: (!) 115/104  132/66   Pulse: 69  63   Resp: 20  20    Temp:    98.6 F (37 C)  TempSrc:    Oral  SpO2: 97%  95%   Weight:      Height:      PainSc:  7       Isolation Precautions No active isolations  Medications Medications  fentaNYL (SUBLIMAZE) injection 50 mcg (50 mcg Intravenous Given 04/19/23 1944)  enoxaparin (LOVENOX) injection 40 mg (has no administration in time range)  acetaminophen (TYLENOL) tablet 1,000 mg (has no administration in time range)  oxyCODONE (Oxy IR/ROXICODONE) immediate release tablet 2.5 mg (has no administration in time range)    Or  oxyCODONE (Oxy IR/ROXICODONE) immediate release tablet 5 mg (has no administration in time range)  methadone (DOLOPHINE) 10 MG/5ML solution 23 mg (has no administration in time range)  dolutegravir-lamiVUDine (DOVATO) 50-300 MG per tablet 1 tablet (has no administration in time range)  vancomycin (VANCOCIN) IVPB 1000 mg/200 mL premix (has no administration in time range)  sodium chloride 0.9 % bolus 500 mL (0 mLs Intravenous Stopped 04/19/23 2137)  vancomycin (VANCOREADY) IVPB 2000 mg/400 mL (0 mg Intravenous Stopped 04/19/23 2137)  HYDROcodone-acetaminophen (NORCO/VICODIN) 5-325 MG per tablet 1 tablet (1 tablet Oral Given 04/19/23 1739)  iohexol (OMNIPAQUE) 300 MG/ML solution 100 mL (100 mLs Intravenous Contrast Given 04/19/23 2253)    Mobility walks     Focused Assessments Cardiac Assessment Handoff:    Lab Results  Component Value Date   CKTOTAL 50 03/11/2021   TROPONINI <0.30 10/01/2012   Lab Results  Component Value Date   DDIMER 0.45 03/18/2020   Does the Patient currently have chest pain? No   , Neuro Assessment Handoff:  Swallow screen pass? Yes          Neuro Assessment:   Neuro Checks:      Has TPA been given? No If patient is a Neuro Trauma and patient is going to OR before floor call report to 4N Charge nurse: [phone] or [phone]  , Pulmonary Assessment Handoff:  Lung sounds:   O2 Device: Room Air      R Recommendations:  See Admitting Provider Note  Report given to:   Additional Notes: hello, this pt coming to ed because of cellulitis on her right leg, being admitted for that, alert x4, Room air, vitals stable. Shoot me a message if you have question thank you

## 2023-04-19 NOTE — Telephone Encounter (Signed)
Statement of medical necessity sent to [facility] for oncotype testing. Received confirmation of receipt

## 2023-04-19 NOTE — Progress Notes (Signed)
Pharmacy Antibiotic Note  Ruth Stephenson is a 72 y.o. female admitted on 04/19/2023 with cellulitis.  Pharmacy has been consulted for Vancomycin dosing.   Plan: Vancomycin 1000 mg IV Q 24 hrs. Goal AUC 400-550.  Expected AUC: 462.3  SCr used: 0.83 Follow renal function F/u culture results and sensitivities   Height: 5\' 3"  (160 cm) Weight: 90.7 kg (200 lb) IBW/kg (Calculated) : 52.4  Temp (24hrs), Avg:98.6 F (37 C), Min:98.4 F (36.9 C), Max:98.8 F (37.1 C)  Recent Labs  Lab 04/13/23 1818 04/13/23 1920 04/13/23 2000 04/19/23 1850 04/19/23 1901 04/19/23 2040  WBC  --  6.5  --   --   --  2.4*  CREATININE 0.79  --   --  0.83  --   --   LATICACIDVEN  --   --  1.0  --  1.1  --     Estimated Creatinine Clearance: 67.4 mL/min (by C-G formula based on SCr of 0.83 mg/dL).    No Known Allergies  Antimicrobials this admission: 10/24 Vancomycin >>      Dose adjustments this admission:    Microbiology results: 10/24 BCx:      Thank you for allowing pharmacy to be a part of this patient's care.  Maryellen Pile, PharmD 04/19/2023 11:44 PM

## 2023-04-19 NOTE — ED Provider Notes (Signed)
Care of patient received from prior provider at 9:57 PM, please see their note for complete H/P and care plan.  Received handoff per ED course.  Clinical Course as of 04/19/23 2157  Thu Apr 19, 2023  1710 IEstelle June DO, am transitioning care of this patient to the oncoming provider pending laboratory workup, IV antibiotics and likely admission [MP]  1712 Possible AMA  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 [CC]    Clinical Course User Index [CC] Glyn Ade, MD [MP] Royanne Foots, DO   Disposition:   Based on the above findings, I believe this patient is stable for admission.    Patient/family educated about specific findings on our evaluation and explained exact reasons for admission.  Patient/family educated about clinical situation and time was allowed to answer questions.   Admission team communicated with and agreed with need for admission. Patient admitted. Patient ready to move at this time.     Emergency Department Medication Summary:   Medications  fentaNYL (SUBLIMAZE) injection 50 mcg (50 mcg Intravenous Given 04/19/23 1944)  enoxaparin (LOVENOX) injection 40 mg (40 mg Subcutaneous Given 04/20/23 1016)  acetaminophen (TYLENOL) tablet 1,000 mg (1,000 mg Oral Given 04/20/23 0345)  methadone (DOLOPHINE) 10 MG/ML solution 23 mg (23 mg Oral Given 04/20/23 0715)  dolutegravir-lamiVUDine (DOVATO) 50-300 MG per tablet 1 tablet (1 tablet Oral Given 04/20/23 1016)  oxyCODONE (Oxy IR/ROXICODONE) immediate release tablet 2.5 mg ( Oral See Alternative 04/20/23 0704)    Or  oxyCODONE (Oxy IR/ROXICODONE) immediate release tablet 5 mg (5 mg Oral Given 04/20/23 0704)  linezolid (ZYVOX) tablet 600 mg (600 mg Oral Given 04/20/23 1221)  sodium chloride 0.9 % bolus 500 mL (0 mLs Intravenous Stopped 04/19/23 2137)  vancomycin (VANCOREADY) IVPB 2000 mg/400 mL (0 mg Intravenous Stopped 04/19/23 2137)  HYDROcodone-acetaminophen (NORCO/VICODIN) 5-325 MG per  tablet 1 tablet (1 tablet Oral Given 04/19/23 1739)  iohexol (OMNIPAQUE) 300 MG/ML solution 100 mL (100 mLs Intravenous Contrast Given 04/19/23 2253)            Glyn Ade, MD 04/20/23 1445

## 2023-04-19 NOTE — ED Provider Notes (Addendum)
[place] EMERGENCY DEPARTMENT AT [facility] Provider Note   CSN: [id number] Arrival date & time: 04/19/23  1449     History  Chief Complaint  Patient presents with   Leg Wound    Ruth Stephenson is a 72 y.o. female.  With a past medical history of HIV in remission and breast cancer presents to the ED for cellulitis.  Patient has experienced right lower extremity cellulitis for the last week or so with symptoms including right lower extremity discomfort increased redness and warmth.  For this reason she was seen in the emergency department on the 18th and was treated with Dalvance for right lower extremity cellulitis.  She was discharged home and has been taking Bactrim which she has at home given concern for persistent infection.  Despite taking Bactrim at home her symptoms have worsened including pain redness and swelling.  No fevers, chills or other complaints at this time  HPI     Home Medications Prior to Admission medications   Medication Sig Start Date End Date Taking? Authorizing Provider  acetaminophen (TYLENOL) 325 MG tablet Take 2 tablets (650 mg total) by mouth every 6 (six) hours as needed for mild pain (or Fever >/= 101). Patient not taking: Reported on 04/04/2023 03/01/23   Marguerita Merles Latif, DO  bisacodyl (DULCOLAX) 5 MG EC tablet Take 2 tablets (10 mg total) by mouth daily. Patient not taking: Reported on 04/04/2023 03/02/23   Marguerita Merles Latif, DO  Cholecalciferol (VITAMIN D3) 50 MCG (2000 UT) capsule Take 2,000 Units by mouth daily.    [provider]  DOVATO 50-300 MG tablet TAKE 1 TABLET BY MOUTH EVERY DAY 11/29/22   [name], MD  [facility] BILOBA PLUS PO Take 120 mg by mouth daily.    [provider]  Menatetrenone (VITAMIN K2) 100 MCG TABS Take 100 mg by mouth daily.    [provider]  methadone (DOLOPHINE) 10 MG/5ML solution Take 23 mg by mouth in the morning.    [provider]  ondansetron (ZOFRAN) 4 MG  tablet Take 1 tablet (4 mg total) by mouth every 6 (six) hours as needed for nausea. Patient not taking: Reported on 04/04/2023 03/01/23   Marguerita Merles Latif, DO  oxyCODONE (OXY IR/ROXICODONE) 5 MG immediate release tablet Take 1 tablet (5 mg total) by mouth every 6 (six) hours as needed for severe pain (pain score 7-10). 04/11/23   Almond Lint, MD      Allergies    Patient has no known allergies.    Review of Systems   Review of Systems  Physical Exam Updated Vital Signs BP 122/71   Pulse 65   Temp 98.4 F (36.9 C)   Resp 18   Ht 5\' 3"  (1.6 m)   Wt 90.7 kg   SpO2 98%   BMI 35.43 kg/m  Physical Exam Vitals and nursing note reviewed.  HENT:     Head: Normocephalic and atraumatic.  Eyes:     Pupils: Pupils are equal, round, and reactive to light.  Cardiovascular:     Rate and Rhythm: Normal rate and regular rhythm.  Pulmonary:     Effort: Pulmonary effort is normal.     Breath sounds: Normal breath sounds.  Abdominal:     Palpations: Abdomen is soft.     Tenderness: There is no abdominal tenderness.  Skin:    General: Skin is warm and dry.     Capillary Refill: Capillary refill takes less than 2 seconds.  Comments: Erythema and edema over right lower extremity with increased warmth extending up through pretibial region  Neurological:     Mental Status: She is alert.  Psychiatric:        Mood and Affect: Mood normal.     ED Results / Procedures / Treatments   Labs (all labs ordered are listed, but only abnormal results are displayed) Labs Reviewed  CULTURE, BLOOD (ROUTINE X 2)  CULTURE, BLOOD (ROUTINE X 2)  CBC WITH DIFFERENTIAL/PLATELET  CBC WITH DIFFERENTIAL/PLATELET  T-HELPER CELLS (CD4) COUNT (NOT AT [facility])  BASIC METABOLIC PANEL  I-STAT CG4 LACTIC ACID, ED  I-STAT CG4 LACTIC ACID, ED    EKG None  Radiology No results found.  Procedures Procedures    Medications Ordered in ED Medications  sodium chloride 0.9 % bolus 500 mL (has no  administration in time range)  vancomycin (VANCOREADY) IVPB 2000 mg/400 mL (has no administration in time range)  HYDROcodone-acetaminophen (NORCO/VICODIN) 5-325 MG per tablet 1 tablet (has no administration in time range)    ED Course/ Medical Decision Making/ A&P Clinical Course as of 04/19/23 1720  Thu Apr 19, 2023  1710 I, Estelle June DO, am transitioning care of this patient to the oncoming provider pending laboratory workup, IV antibiotics and likely admission [MP]  1712 Possible AMA  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 [CC]    Clinical Course User Index [CC] Glyn Ade, MD [MP] Royanne Foots, DO                                 Medical Decision Making 72 year old female with history as above returns for refractory right lower extremity cellulitis.  Was seen here few days ago and treated with Dalvance.  Has been taking Bactrim at home given concern for MRSA.  Increased redness and swelling of right lower extremity have worsened.  No systemic symptoms.  Afebrile and normotensive here.  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  She would likely require inpatient admission for IV antibiotics and further management  Amount and/or Complexity of Data Reviewed Labs: ordered.  Risk Prescription drug management.           Final Clinical Impression(s) / ED Diagnoses Final diagnoses:  Cellulitis of right lower extremity    Rx / DC Orders ED Discharge Orders     None         Royanne Foots, DO 04/19/23 1645    Estelle June A, DO 04/19/23 1720

## 2023-04-19 NOTE — Progress Notes (Signed)
A consult was received from an ED physician for vanc per pharmacy dosing.  [name] profile has been reviewed for ht/wt/allergies/indication/available labs.   A one time order has been placed for vanc 2g.  Further antibiotics/pharmacy consults should be ordered by admitting physician if indicated.                       Thank you, Berkley Harvey 04/19/2023  4:33 PM

## 2023-04-20 ENCOUNTER — Other Ambulatory Visit: Payer: Self-pay

## 2023-04-20 ENCOUNTER — Encounter: Payer: Self-pay | Admitting: Genetic Counselor

## 2023-04-20 ENCOUNTER — Ambulatory Visit: Payer: Self-pay | Admitting: Genetic Counselor

## 2023-04-20 ENCOUNTER — Other Ambulatory Visit (HOSPITAL_COMMUNITY): Payer: Self-pay

## 2023-04-20 ENCOUNTER — Inpatient Hospital Stay (HOSPITAL_COMMUNITY): Payer: Medicare HMO

## 2023-04-20 DIAGNOSIS — Z1379 Encounter for other screening for genetic and chromosomal anomalies: Secondary | ICD-10-CM

## 2023-04-20 DIAGNOSIS — L03115 Cellulitis of right lower limb: Secondary | ICD-10-CM | POA: Diagnosis not present

## 2023-04-20 DIAGNOSIS — M7989 Other specified soft tissue disorders: Secondary | ICD-10-CM

## 2023-04-20 LAB — HEPATIC FUNCTION PANEL
ALT: 17 U/L (ref 0–44)
AST: 32 U/L (ref 15–41)
Albumin: 3.2 g/dL — ABNORMAL LOW (ref 3.5–5.0)
Alkaline Phosphatase: 70 U/L (ref 38–126)
Bilirubin, Direct: 0.4 mg/dL — ABNORMAL HIGH (ref 0.0–0.2)
Indirect Bilirubin: 1.2 mg/dL — ABNORMAL HIGH (ref 0.3–0.9)
Total Bilirubin: 1.6 mg/dL — ABNORMAL HIGH (ref 0.3–1.2)
Total Protein: 7.9 g/dL (ref 6.5–8.1)

## 2023-04-20 LAB — BASIC METABOLIC PANEL
Anion gap: 11 (ref 5–15)
BUN: 11 mg/dL (ref 8–23)
CO2: 24 mmol/L (ref 22–32)
Calcium: 9.4 mg/dL (ref 8.9–10.3)
Chloride: 106 mmol/L (ref 98–111)
Creatinine, Ser: 0.71 mg/dL (ref 0.44–1.00)
GFR, Estimated: >60 mL/min (ref >60)
Glucose, Bld: 112 mg/dL — ABNORMAL HIGH (ref 70–99)
Potassium: 5 mmol/L (ref 3.5–5.1)
Sodium: 141 mmol/L (ref 135–145)

## 2023-04-20 LAB — CBC
HCT: 39.9 % (ref 36.0–46.0)
Hemoglobin: 12.9 g/dL (ref 12.0–15.0)
MCH: 33.9 pg (ref 26.0–34.0)
MCHC: 32.3 g/dL (ref 30.0–36.0)
MCV: 105 fL — ABNORMAL HIGH (ref 80.0–100.0)
Platelets: 112 10*3/uL — ABNORMAL LOW (ref 150–400)
RBC: 3.8 MIL/uL — ABNORMAL LOW (ref 3.87–5.11)
RDW: 13 % (ref 11.5–15.5)
WBC: 1.9 10*3/uL — ABNORMAL LOW (ref 4.0–10.5)
nRBC: 0 % (ref 0.0–0.2)

## 2023-04-20 LAB — RAPID URINE DRUG SCREEN, HOSP PERFORMED
Amphetamines: NOT DETECTED
Barbiturates: NOT DETECTED
Benzodiazepines: NOT DETECTED
Cocaine: NOT DETECTED
Opiates: POSITIVE — AB
Tetrahydrocannabinol: NOT DETECTED

## 2023-04-20 LAB — BRAIN NATRIURETIC PEPTIDE: B Natriuretic Peptide: 127.3 pg/mL — ABNORMAL HIGH (ref 0.0–100.0)

## 2023-04-20 LAB — SEDIMENTATION RATE: Sed Rate: 58 mm/h — ABNORMAL HIGH (ref 0–22)

## 2023-04-20 LAB — T-HELPER CELLS (CD4) COUNT (NOT AT ARMC)
CD4 % Helper T Cell: 24 % — ABNORMAL LOW (ref 33–65)
CD4 T Cell Abs: 215 /uL — ABNORMAL LOW (ref 400–1790)

## 2023-04-20 LAB — HEMOGLOBIN A1C
Hgb A1c MFr Bld: 5.1 % (ref 4.8–5.6)
Mean Plasma Glucose: 99.67 mg/dL

## 2023-04-20 LAB — PHOSPHORUS: Phosphorus: 2.8 mg/dL (ref 2.5–4.6)

## 2023-04-20 LAB — C-REACTIVE PROTEIN: CRP: 2.7 mg/dL — ABNORMAL HIGH (ref <1.0)

## 2023-04-20 LAB — MAGNESIUM: Magnesium: 1.9 mg/dL (ref 1.7–2.4)

## 2023-04-20 MED ORDER — SODIUM CHLORIDE 0.9 % IV SOLN
100.0000 mg | Freq: Two times a day (BID) | INTRAVENOUS | Status: DC
  Filled 2023-04-20: qty 100

## 2023-04-20 MED ORDER — OXYCODONE HCL 5 MG PO TABS
5.0000 mg | ORAL_TABLET | ORAL | Status: DC | PRN
  Administered 2023-04-20 – 2023-04-22 (×7): 5 mg via ORAL
  Filled 2023-04-20 (×8): qty 1

## 2023-04-20 MED ORDER — OXYCODONE HCL 5 MG PO TABS: 2.5000 mg | ORAL_TABLET | ORAL | Status: DC | PRN

## 2023-04-20 MED ORDER — METHADONE HCL 10 MG/ML PO CONC
23.0000 mg | Freq: Every day | ORAL | Status: DC
  Administered 2023-04-21 – 2023-04-22 (×2): 23 mg via ORAL
  Filled 2023-04-20 (×2): qty 5

## 2023-04-20 MED ORDER — LINEZOLID 600 MG PO TABS
600.0000 mg | ORAL_TABLET | Freq: Two times a day (BID) | ORAL | Status: DC
  Administered 2023-04-20 – 2023-04-22 (×5): 600 mg via ORAL
  Filled 2023-04-20 (×5): qty 1

## 2023-04-20 NOTE — H&P (Signed)
History and Physical    Ruth Stephenson ZDG:[id number] DOB: [birth year] DOA: 04/19/2023  PCP: Ruth Smart, MD   Patient coming from: Home   Chief Complaint:  Chief Complaint  Patient presents with   Leg Wound    HPI:  Ruth Stephenson is a 72 y.o. female with hx of HIV/AIDS on HAART, Hx colon ca with hx R hemicolectomy, breast CA with recent lumpectomy, recurrent cellulitis involving the RLE, who presents with rapid progression of redness up her R leg above the knee and associated with draining blisters. Hx multiple treatment for cellulitis of this limb since 8/'24. Reports gets better than returns after treatment. Most recently seen in ED on 10/18 and received a dose of Dalbavancin. Improved, but over past 2 days rapid progression up thigh. Also having blister that opened and drained on the R lateral ankle and has now scabbed. Another area of blistering that drained on the posterior calf. She denies any trauma, no exposure to freshwater, other contamination, no animals. Denies any ongoing IVDU or skin popping. Has been taking HAART as prescribed.    Review of Systems:  ROS complete and negative except as marked above   No Known Allergies  Prior to Admission medications   Medication Sig Start Date End Date Taking? Authorizing Provider  Cholecalciferol (VITAMIN D3) 50 MCG (2000 UT) capsule Take 2,000 Units by mouth daily.   Yes [provider]  DOVATO 50-300 MG tablet TAKE 1 TABLET BY MOUTH EVERY DAY 11/29/22  Yes Ruth Smart, MD  GINKGO BILOBA PLUS PO Take 120 mg by mouth daily.   Yes [provider]  Menatetrenone (VITAMIN K2) 100 MCG TABS Take 100 mg by mouth daily.   Yes [provider]  methadone (DOLOPHINE) 10 MG/5ML solution Take 23 mg by mouth in the morning.   Yes [provider]  oxyCODONE (OXY IR/ROXICODONE) 5 MG immediate release tablet Take 1 tablet (5 mg total) by mouth every 6 (six) hours as needed for severe pain (pain score  7-10). 04/11/23  Yes Ruth Lint, MD  acetaminophen (TYLENOL) 325 MG tablet Take 2 tablets (650 mg total) by mouth every 6 (six) hours as needed for mild pain (or Fever >/= 101). Patient not taking: Reported on 04/04/2023 03/01/23   Ruth Merles Latif, DO  bisacodyl (DULCOLAX) 5 MG EC tablet Take 2 tablets (10 mg total) by mouth daily. Patient not taking: Reported on 04/04/2023 03/02/23   Ruth Merles Latif, DO  ondansetron (ZOFRAN) 4 MG tablet Take 1 tablet (4 mg total) by mouth every 6 (six) hours as needed for nausea. Patient not taking: Reported on 04/04/2023 03/01/23   Ruth Laughter, DO    Past Medical History:  Diagnosis Date   Angio-edema    Breast cancer [facility])    Cancer [facility])    Cancer of right colon (HCC) 10/24/2012   Discussed with h/o- no need for further f/u (08-2020)     Cirrhosis of liver (HCC)    had a liver u/s in 2014 that showed that she was cirrhotic.   Hepatitis C    treated ([place]) 2015   History of COVID-19 06/03/2020   HIV (human immunodeficiency virus infection) (HCC)    Pneumonia 03/15/2021   in hospital 02-2021 with Pneumococcal pna and non-adherence to meds.   Polysubstance abuse (HCC)    Tuberculosis    neg chest xray    Past Surgical History:  Procedure Laterality Date   BREAST BIOPSY Left 03/22/2023   [place]  LT BREAST BX W LOC DEV 1ST LESION IMG BX SPEC US GUIDE 03/22/2023 [facility] MAMMOGRAPHY   BREAST BIOPSY Left 04/10/2023   [place] LT RADIOACTIVE SEED LOC 04/10/2023 [facility] ULTRASOUND   BREAST LUMPECTOMY WITH RADIOACTIVE SEED LOCALIZATION Left 04/11/2023   Procedure: LEFT BREAST SEED LOCALIZED LUMPECTOMY;  Surgeon: Ruth Lint, MD;  Location: [facility] OR;  Service: General;  Laterality: Left;   CARPAL TUNNEL RELEASE Left 01/19/2014   Procedure: LEFT CARPAL TUNNEL RELEASE;  Surgeon: Marlowe Shores, MD;  Location: [facility];  Service: Orthopedics;  Laterality: Left;   COLON RESECTION N/A 09/23/2012   Procedure: LAPAROSCOPIC RIGHT COLON RESECTION    ;  Surgeon: Mariella Saa, MD;  Location: [facility];  Service: General;  Laterality: N/A;  LAPOROSCOPY, RIGHT HEMICOLECTOMY   COLON SURGERY  09/23/2012   lap right colectomy   COLONOSCOPY N/A 09/20/2012   Procedure: COLONOSCOPY;  Surgeon: Theda Belfast, MD;  Location: [facility];  Service: Endoscopy;  Laterality: N/A;   OPEN REDUCTION INTERNAL FIXATION (ORIF) DISTAL RADIAL FRACTURE Left 01/19/2014   Procedure: OPEN REDUCTION INTERNAL FIXATION (ORIF) LEFT  DISTAL RADIAL FRACTURE;  Surgeon: Marlowe Shores, MD;  Location: [facility];  Service: Orthopedics;  Laterality: Left;     reports that she has quit smoking. Her smoking use included cigarettes. She has a 17.5 pack-year smoking history. She has been exposed to tobacco smoke. She has quit using smokeless tobacco. She reports that she does not currently use alcohol. She reports that she does not currently use drugs.  Family History  Problem Relation Age of Onset   Angioedema Mother    Cancer Mother 68 - 38       unknown type   Asthma Brother    Throat cancer Maternal Uncle    Angioedema Maternal Grandmother      Physical Exam: Vitals:   04/19/23 1951 04/19/23 2138 04/19/23 2328 04/20/23 0031  BP: (!) 115/104 132/66  (!) 153/77  Pulse: 69 63  66  Resp: 20 20  18   Temp:   98.6 F (37 C) 98.4 F (36.9 C)  TempSrc:   Oral Oral  SpO2: 97% 95%  100%  Weight:      Height:        Gen: Awake, alert, NAD  CV: Regular, normal S1, S2, no murmurs  Resp: Normal WOB, CTAB  Abd: Flat, normoactive, nontender MSK: There is nonpitting edema below the knee on the R side. Area of erythema within outline marked above the knee, + increased warmth. There is hyperpigmentary changes bilaterally. Area of scabbed lesion on the R lateral ankle. No other wounds on the RLE.  Skin: See MSK  Neuro: Alert and interactive  Psych: euthymic, appropriate    Data review:   Labs reviewed, notable for:   Lactate 1.1  WBC 2.4, ANC  1.1, Hb 11, MCV 104, PLT 102   4/'24  Last CD4 151, HIV VL undetectable    Micro:  Results for orders placed or performed during the hospital encounter of 04/13/23  Urine Culture     Status: None   Collection Time: 04/13/23 10:07 PM   Specimen: Urine, Random  Result Value Ref Range Status   Specimen Description   Final    URINE, RANDOM Performed at [facility], [address], [place], [place] [address]    Special Requests   Final    NONE Reflexed from [id number] Performed at [facility], [address] [place],  [place] [address]    Culture   Final    NO GROWTH Performed at [facility], [address], [place], [place] [address]    Report Status 04/15/2023 FINAL  Final    Imaging reviewed:  CT TIBIA FIBULA RIGHT W CONTRAST  Result Date: 04/19/2023 CLINICAL DATA:  Right lower extremity soft tissue infection EXAM: CT OF THE LOWER RIGHT EXTREMITY WITH CONTRAST TECHNIQUE: Multidetector CT imaging of the lower right extremity was performed according to the standard protocol following intravenous contrast administration. RADIATION DOSE REDUCTION: This exam was performed according to the departmental dose-optimization program which includes automated exposure control, adjustment of the mA and/or kV according to patient size and/or use of iterative reconstruction technique. CONTRAST:  OMNIPAQUE IOHEXOL 300 MG/ML  SOLN COMPARISON:  None Available. FINDINGS: Bones/Joint/Cartilage Normal alignment. No acute fracture or dislocation. 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 Joint spaces of the right ankle and visualized right hindfoot are preserved. No lytic or blastic bone lesion. No erosions or abnormal periosteal reaction. Ligaments Suboptimally assessed by CT. Muscles and Tendons Unremarkable Soft tissues Mild prepatellar edema. There is circumferential dermal thickening and subcutaneous edema, asymmetrically  more severe along the medial aspect of the distal right foreleg at the level of the distal diaphysis of the tibia. No loculated subcutaneous fluid collection identified. Numerous superficial varicosities are identified arising from incompetent perforator veins from the distal posterior tibial vein, lateral gastrocnemius vein and lesser saphenous vein. IMPRESSION: 1. Circumferential dermal thickening and subcutaneous edema, asymmetrically more severe along the medial aspect of the distal right foreleg at the level of the distal diaphysis of the tibia. No loculated subcutaneous fluid collection identified. 2. 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 3. 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 4. Mild prepatellar subcutaneous edema. Electronically Signed   By: Helyn Numbers M.D.   On: 04/19/2023 23:30   DG Tibia/Fibula Right  Result Date: 04/19/2023 CLINICAL DATA:  Leg pain.  Cellulitis. EXAM: RIGHT TIBIA AND FIBULA - 2 VIEW COMPARISON:  02/20/2023 FINDINGS: There is no evidence of fracture or other focal bone lesions. No erosive change or periostitis. Knee and ankle alignment are maintained with knee degenerative change. Generalized soft tissue edema. No soft tissue gas. Scattered soft tissue phleboliths. No radiopaque foreign body peer IMPRESSION: Generalized soft tissue edema. No soft tissue gas or radiographic evidence of osteomyelitis. Electronically Signed   By: Narda Rutherford M.D.   On: 04/19/2023 22:19    ED Course:  Treated with vancomycin, 500 cc IV fluid,    Assessment/Plan:  72 y.o. female with hx HIV/AIDS on HAART, Hx colon ca with hx R hemicolectomy, breast CA with recent lumpectomy, recurrent cellulitis involving the RLE, who presents with recurrent / relapsed cellulitis   Recurrent cellulitis of RLE  Hx episodes of  cellulitis involving RLE since 8/'24, last treatment with Dalbavancin on 10/18 during ED visit. Subsequent improvement but rapid progression and proximal extension in past 2 days. CT imaging with soft tissue edema but no abscess. Hx immunosuppression with hx HIV / AIDS. Worsening leukopenia although ANC 1.1 currently.  - Continue Vancomycin pharm to dose  - Check duplex to r/o underlying DVT. Does have hx malignancy  - Check ESR, CRP, A1c  - Consider ID involvement in AM with recurrent cellulitis   Chronic medical problems:  Hx Breast CA with recent lumpectomy: OP f/u, considering  hormonal therapy  Remote hx colon Ca and R sided hemicolectomy: Question if may have lymph disruption from prior surg predisposing to recurrent infection  HIV/AIDS: Check CD4, continue Dovato  Hx opiate use d/o: Continue methadone, check UDS    Body mass index is 35.43 kg/m. Obesity class II affecting medical care per above    DVT prophylaxis:  Lovenox Code Status:  DNR/DNI(Do NOT Intubate); confirmed with patient  Diet:  Diet Orders (From admission, onward)     Start     Ordered   04/19/23 2239  Diet regular Room service appropriate? Yes; Fluid consistency: Thin  Diet effective now       Question Answer Comment  Room service appropriate? Yes   Fluid consistency: Thin      04/19/23 2242           Family Communication:  No   Consults:  None   Admission status:   Inpatient, Med-Surg  Severity of Illness: The appropriate patient status for this patient is INPATIENT. Inpatient status is judged to be reasonable and necessary in order to provide the required intensity of service to ensure the patient's safety. [name] presenting symptoms, physical exam findings, and initial radiographic and laboratory data in the context of their chronic comorbidities is felt to place them at high risk for further clinical deterioration. Furthermore, it is not anticipated that the patient will be medically stable for  discharge from the hospital within 2 midnights of admission.   * I certify that at the point of admission it is my clinical judgment that the patient will require inpatient hospital care spanning beyond 2 midnights from the point of admission due to high intensity of service, high risk for further deterioration and high frequency of surveillance required.*   Dolly Rias, MD [facility]  How to contact the [facility] Attending or Consulting provider 7A - 7P or covering provider during after hours 7P -7A, for this patient.  Check the care team in [facility] and look for a) attending/consulting [facility] provider listed and b) the [facility] team listed Log into [email or web address] and use 's universal password to access. If you do not have the password, please contact the hospital operator. Locate the [facility] provider you are looking for under [facility] and page to a number that you can be directly reached. If you still have difficulty reaching the provider, please page the [facility] (Director on Call) for the Hospitalists listed on amion for assistance.  04/20/2023, 3:21 AM

## 2023-04-20 NOTE — Progress Notes (Signed)
[facility] PROGRESS NOTE    Progress Note  Ruth Stephenson  VHQ:[id number] DOB: 02/18/1951 DOA: 04/19/2023 PCP: [name], MD     Brief Narrative:   Ruth Stephenson is an 72 y.o. 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   Assessment/Plan:   Cellulitis of leg, right CT of the tibia and fibula showed dermal thickening and subcutaneous edema of the right foreleg no abscess or fluid collections Started on IV vancomycin, lower extremity Doppler ESR CRP and A1c are pending. She is mildly leukopenic.  She has remained afebrile. Discontinue IV bank switch her to IV doxycycline.  The erythema has receded swelling is down. Consult infectious disease  History of breast cancer: Follow-up with oncology as an outpatient.  Remote history of colon cancer: Noted.  HIV/AIDS: Continue Dovato, CD4 count is pending.  Last time was 151. There is likely due to noncompliance with her medication. Will get infectious disease involved she will need to be started on Bactrim for PCP prophylaxis.  History of opiate use: Continue methadone.   DVT prophylaxis: lovenox Family Communication:none Status is: Inpatient Remains inpatient appropriate because: Right lower extremity cellulitis    Code Status:     Code Status Orders  (From admission, onward)           Start     Ordered   04/19/23 2327  Do not attempt resuscitation (DNR)- Limited -Do Not Intubate (DNI)  (Code Status)   Continuous       Question Answer Comment  If pulseless and not breathing No CPR or chest compressions.   In Pre-Arrest Conditions (Patient Is Breathing and Has A Pulse) Do not intubate. Provide all appropriate non-invasive medical interventions. Avoid ICU transfer unless indicated or required.   Consent: Discussion documented in EHR or advanced directives reviewed      04/19/23 2326           Code Status History     Date Active Date Inactive Code Status Order ID Comments User Context   04/19/2023 2242 04/19/2023 2326 Full Code [id number]  Dolly Rias, MD ED   02/26/2023 2203 03/01/2023 1857 Limited: Do not attempt resuscitation (DNR) -DNR-LIMITED -Do Not Intubate/DNI  [id number]  Carollee Herter, DO ED   02/26/2023 2143 02/26/2023 2203 Limited: Do not attempt resuscitation (DNR) -DNR-LIMITED -Do Not Intubate/DNI  [id number]  Carollee Herter, DO ED   03/11/2021 0009 03/14/2021 1652 Full Code [id number]  Therisa Doyne, MD ED   03/15/2020 0045 03/18/2020 1946 Full Code [id number]  Delano Metz, MD Inpatient   05/04/2017 1943 05/09/2017 1811 Full Code [id number]  Tobey Grim, NP ED   09/19/2012 0512 10/09/2012 2127 Full Code [id number]  Eduard Clos, MD Inpatient         IV Access:   Peripheral IV   Procedures and diagnostic studies:   CT TIBIA FIBULA RIGHT W CONTRAST  Result Date: 04/19/2023 CLINICAL DATA:  Right lower extremity soft tissue infection EXAM: CT OF THE LOWER RIGHT EXTREMITY WITH CONTRAST TECHNIQUE: Multidetector CT imaging of  the lower right extremity was performed according to the standard protocol following intravenous contrast administration. RADIATION DOSE REDUCTION: This exam was performed according to the departmental dose-optimization program which includes automated exposure control, adjustment of the mA and/or kV according to patient size and/or use of iterative reconstruction technique. CONTRAST:  OMNIPAQUE IOHEXOL 300 MG/ML  SOLN COMPARISON:  None  Available. FINDINGS: Bones/Joint/Cartilage Normal alignment. No acute fracture or dislocation. 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 Joint spaces of the right ankle and visualized right hindfoot are preserved. No lytic or blastic bone lesion. No erosions or abnormal periosteal reaction. Ligaments Suboptimally assessed by CT. Muscles and Tendons Unremarkable Soft tissues Mild prepatellar edema. There is circumferential dermal thickening and subcutaneous edema, asymmetrically more severe along the medial aspect of the distal right foreleg at the level of the distal diaphysis of the tibia. No loculated subcutaneous fluid collection identified. Numerous superficial varicosities are identified arising from incompetent perforator veins from the distal posterior tibial vein, lateral gastrocnemius vein and lesser saphenous vein. IMPRESSION: 1. Circumferential dermal thickening and subcutaneous edema, asymmetrically more severe along the medial aspect of the distal right foreleg at the level of the distal diaphysis of the tibia. No loculated subcutaneous fluid collection identified. 2. 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 3. 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 4. Mild prepatellar subcutaneous edema. Electronically Signed   By: Helyn Numbers M.D.   On: 04/19/2023 23:30   DG Tibia/Fibula Right  Result Date: 04/19/2023 CLINICAL DATA:  Leg pain.  Cellulitis. EXAM: RIGHT TIBIA AND FIBULA - 2 VIEW COMPARISON:  02/20/2023 FINDINGS: There is no evidence of fracture or other focal bone lesions. No erosive change or periostitis. Knee and ankle alignment are maintained with knee degenerative change. Generalized soft tissue edema. No soft tissue gas. Scattered soft tissue phleboliths. No radiopaque foreign body peer  IMPRESSION: Generalized soft tissue edema. No soft tissue gas or radiographic evidence of osteomyelitis. Electronically Signed   By: Narda Rutherford M.D.   On: 04/19/2023 22:19     Medical Consultants:   None.   Subjective:    Ruth Stephenson she relates her right lower extremity pain is better  Objective:    Vitals:   04/19/23 2138 04/19/23 2328 04/20/23 0031 04/20/23 0620  BP: 132/66  (!) 153/77 (!) 145/70  Pulse: 63  66 63  Resp: 20  18 18   Temp:  98.6 F (37 C) 98.4 F (36.9 C) 97.9 F (36.6 C)  TempSrc:  Oral Oral Oral  SpO2: 95%  100% 94%  Weight:      Height:       SpO2: 94 %   Intake/Output Summary (Last 24 hours) at 04/20/2023 0715 Last data filed at 04/20/2023 7829 Gross per 24 hour  Intake 240 ml  Output 800 ml  Net -560 ml   Filed Weights   04/19/23 1457  Weight: 90.7 kg    Exam: General exam: In no acute distress. Respiratory system: Good air movement and clear to auscultation. Cardiovascular system: S1 & S2 heard, RRR. No JVD. Gastrointestinal system: Abdomen is nondistended, soft and nontender.  Extremities: No pedal edema. Skin: Right lower extremity erythema has receded it is well below the lubricated area swelling is down open wound is dried. Psychiatry: Judgement and insight appear normal. Mood & affect appropriate.    Data Reviewed:    Labs: Basic  Metabolic Panel: Recent Labs  Lab 04/13/23 1818 04/19/23 1850  NA 137 136  K 4.2 4.0  CL 105 102  CO2 25 27  GLUCOSE 147* 79  BUN 16 14  CREATININE 0.79 0.83  CALCIUM 8.9 8.6*   GFR Estimated Creatinine Clearance: 67.4 mL/min (by C-G formula based on SCr of 0.83 mg/dL). Liver Function Tests: Recent Labs  Lab 04/13/23 1818  AST 22  ALT 14  ALKPHOS 70  BILITOT 1.6*  PROT 7.4  ALBUMIN 3.3*   No results for input(s): "LIPASE", "AMYLASE" in the last 168 hours. No results for input(s): "AMMONIA" in the last 168 hours. Coagulation profile No results for input(s): "INR",  "PROTIME" in the last 168 hours. COVID-19 Labs  No results for input(s): "DDIMER", "FERRITIN", "LDH", "CRP" in the last 72 hours.  Lab Results  Component Value Date   SARSCOV2NAA NEGATIVE 05/07/2022   SARSCOV2NAA NEGATIVE 03/10/2021   SARSCOV2NAA POSITIVE (A) 03/14/2020    CBC: Recent Labs  Lab 04/13/23 1920 04/19/23 2040  WBC 6.5 2.4*  NEUTROABS 4.4 1.1*  HGB 12.4 11.7*  HCT 38.4 36.4  MCV 105.5* 104.9*  PLT 75* 102*   Cardiac Enzymes: No results for input(s): "CKTOTAL", "CKMB", "CKMBINDEX", "TROPONINI" in the last 168 hours. BNP (last 3 results) No results for input(s): "PROBNP" in the last 8760 hours. CBG: No results for input(s): "GLUCAP" in the last 168 hours. D-Dimer: No results for input(s): "DDIMER" in the last 72 hours. Hgb A1c: No results for input(s): "HGBA1C" in the last 72 hours. Lipid Profile: No results for input(s): "CHOL", "HDL", "LDLCALC", "TRIG", "CHOLHDL", "LDLDIRECT" in the last 72 hours. Thyroid function studies: No results for input(s): "TSH", "T4TOTAL", "T3FREE", "THYROIDAB" in the last 72 hours.  Invalid input(s): "FREET3" Anemia work up: No results for input(s): "VITAMINB12", "FOLATE", "FERRITIN", "TIBC", "IRON", "RETICCTPCT" in the last 72 hours. Sepsis Labs: Recent Labs  Lab 04/13/23 1920 04/13/23 2000 04/19/23 1901 04/19/23 2040  WBC 6.5  --   --  2.4*  LATICACIDVEN  --  1.0 1.1  --    Microbiology Recent Results (from the past 240 hour(s))  Urine Culture     Status: None   Collection Time: 04/13/23 10:07 PM   Specimen: Urine, Random  Result Value Ref Range Status   Specimen Description   Final    URINE, RANDOM Performed at [facility], [address], [place], [place] [address]    Special Requests   Final    NONE Reflexed from [id number] Performed at [facility], [address] [place], [place] [address]    Culture   Final    NO GROWTH Performed at [facility], [address], [place], [place] [address]    Report Status 04/15/2023 FINAL  Final     Medications:    dolutegravir-lamiVUDine  1 tablet Oral Daily   enoxaparin (LOVENOX) injection  40 mg Subcutaneous Q24H   methadone  23 mg Oral q AM   Continuous Infusions:  vancomycin        LOS: 1 day   Marinda Elk  [facility]  04/20/2023, 7:15 AM

## 2023-04-20 NOTE — Progress Notes (Signed)
HPI:   Ruth Stephenson was previously seen in the [facility] due to a personal and family history of cancer and concerns regarding a hereditary predisposition to cancer. Please refer to our prior cancer genetics clinic note for more information regarding our discussion, assessment and recommendations, at the time. Ruth Stephenson's recent genetic test results were disclosed to her, as were recommendations warranted by these results. These results and recommendations are discussed in more detail below.  CANCER HISTORY:  Oncology History  Malignant neoplasm of upper-outer quadrant of left female breast (HCC)  03/15/2023 Mammogram   Highly suspicious 1.5 cm UPPER-OUTER LEFT breast mass. Tissue sampling is recommended. Deep LEFT axillary lymph node with borderline cortical thickening. Attempt at tissue sampling is recommended.   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)   03/27/2023 Cancer Staging   Staging form: Breast, AJCC 8th Edition - Clinical stage from 03/27/2023: Stage IA (cT1c, cN0, cM0, G2, ER+, PR+, HER2-) - Signed by Ronny Bacon, PA-C on 03/27/2023 Stage prefix: Initial diagnosis Method of lymph node assessment: Clinical Histologic grading system: 3 grade system    Pathology Results    INVASIVE MODERATELY DIFFERENTIATED DUCTAL ADENOCARCINOMA, GRADE 2 (3+2+1)      DUCTAL CARCINOMA IN SITU, INTERMEDIATE NUCLEAR GRADE, SOLID AND CRIBRIFORM TYPES      WITHOUT NECROSIS      TUBULE FORMATION: SCORE 3      NUCLEAR PLEOMORPHISM: SCORE 2      MITOTIC COUNT: SCORE 1      TOTAL SCORE: 6      OVERALL GRADE GRADE 2 (6/9)      RARE MICROCALCIFICATION PRESENT WITHIN INVASIVE TUMOR      NEGATIVE FOR ANGIOLYMPHATIC INVASION      TUMOR MEASURES 11 MM IN GREATEST LINEAR EXTENT    The tumor cells are negative for Her2 (1+). Estrogen Receptor:  95%, POSITIVE, STRONG STAINING INTENSITY Progesterone Receptor:  30%,  POSITIVE, STRONG STAINING INTENSITY Proliferation Marker Ki67:  5%     Genetic Testing   [facility] CancerNext-Expanded Panel+RNA was Negative. 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 Report date is 04/12/2023.  The CancerNext-Expanded gene panel offered by [facility] and includes sequencing, rearrangement, and RNA analysis for the following 71 genes: AIP, ALK, APC, ATM, AXIN2, BAP1, BARD1, BMPR1A, BRCA1, BRCA2, BRIP1, CDC73, CDH1, CDK4, CDKN1B, CDKN2A, CHEK2, CTNNA1, DICER1, FH, FLCN, KIF1B, LZTR1, MAX, MEN1, MET, MLH1, MSH2, MSH3, MSH6, MUTYH, NF1, NF2, NTHL1, PALB2, PHOX2B, PMS2, POT1, PRKAR1A, PTCH1, PTEN, RAD51C, RAD51D, RB1, RET, SDHA, SDHAF2, SDHB, SDHC, SDHD, SMAD4, SMARCA4, SMARCB1, SMARCE1, STK11, SUFU, TMEM127, TP53, TSC1, TSC2, and VHL (sequencing and deletion/duplication); EGFR, EGLN1, HOXB13, KIT, MITF, PDGFRA, POLD1, and POLE (sequencing only); EPCAM and GREM1 (deletion/duplication only).       FAMILY HISTORY:  We obtained a detailed, 4-generation family history.  Significant diagnoses are listed below:      Family History  Problem Relation Age of Onset   Angioedema Mother     Cancer Mother 47 - 58        unknown type   Asthma Brother     Throat cancer Maternal Uncle     Angioedema Maternal Grandmother             Ruth Stephenson is unaware of previous family history of genetic testing for hereditary cancer risks. There is no reported Ashkenazi Jewish ancestry.    GENETIC TEST RESULTS:  The [facility]  CancerNext-Expanded Panel found no pathogenic mutations.  The CancerNext-Expanded gene panel offered by [facility] and includes sequencing, rearrangement, and RNA analysis for the following 71 genes: AIP, ALK, APC, ATM, AXIN2, BAP1, BARD1, BMPR1A, BRCA1, BRCA2, BRIP1, CDC73, CDH1, CDK4, CDKN1B, CDKN2A, CHEK2, CTNNA1, DICER1, FH, FLCN, KIF1B, LZTR1, MAX, MEN1, MET, MLH1, MSH2, MSH3, MSH6, MUTYH, NF1, NF2, NTHL1, PALB2, PHOX2B, PMS2,  POT1, PRKAR1A, PTCH1, PTEN, RAD51C, RAD51D, RB1, RET, SDHA, SDHAF2, SDHB, SDHC, SDHD, SMAD4, SMARCA4, SMARCB1, SMARCE1, STK11, SUFU, TMEM127, TP53, TSC1, TSC2, and VHL (sequencing and deletion/duplication); EGFR, EGLN1, HOXB13, KIT, MITF, PDGFRA, POLD1, and POLE (sequencing only); EPCAM and GREM1 (deletion/duplication only).    The test report has been scanned into EPIC and is located under the Molecular Pathology section of the Results Review tab.  A portion of the result report is included below for reference. Genetic testing reported out on 04/12/2023.       Genetic testing identified a variant of uncertain significance (VUS) in the MLH1 gene called p.G351R.  At this time, it is unknown if this variant is associated with an increased risk for cancer or if it is benign, but most uncertain variants are reclassified to benign. It should not be used to make medical management decisions. With time, we suspect the laboratory will determine the significance of this variant, if any. If the laboratory reclassifies this variant, we will attempt to contact Ruth Stephenson to discuss it further.   Even though a pathogenic variant was not identified, possible explanations for the cancer in the family may include: There may be no hereditary risk for cancer in the family. The cancers in Ruth Stephenson and/or her family may be due to other genetic or environmental factors. There may be a gene mutation in one of these genes that current testing methods cannot detect, but that chance is small. There could be another gene that has not yet been discovered, or that we have not yet tested, that is responsible for the cancer diagnoses in the family.  It is also possible there is a hereditary cause for the cancer in the family that Ruth Stephenson did not inherit.  Therefore, it is important to remain in touch with cancer genetics in the future so that we can continue to offer Ruth Stephenson the most up to date genetic testing.   ADDITIONAL  GENETIC TESTING:  We discussed with Ruth Stephenson that her genetic testing was fairly extensive.  If there are genes identified to increase cancer risk that can be analyzed in the future, we would be happy to discuss and coordinate this testing at that time.      CANCER SCREENING RECOMMENDATIONS:  Ruth Stephenson test result is considered negative (normal).  This means that we have not identified a hereditary cause for her personal and family history of cancer at this time.   An individual's cancer risk and medical management are not determined by genetic test results alone. Overall cancer risk assessment incorporates additional factors, including personal medical history, family history, and any available genetic information that may result in a personalized plan for cancer prevention and surveillance. Therefore, it is recommended she continue to follow the cancer management and screening guidelines provided by her oncology and primary healthcare provider.  Based on the reported personal and family history, specific cancer screenings for Ruth Stephenson and her family include:  Colon Cancer Screening: Due to Ruth Stephenson's personal history of colon cancer, her children and siblings are recommended to begin colonoscopies at age 26 and  repeat every 5 years. More frequent colonoscopies may be recommended if polyps are identified.  RECOMMENDATIONS FOR FAMILY MEMBERS:   Since she did not inherit a mutation in a cancer predisposition gene included on this panel, her children could not have inherited a mutation from her in one of these genes. Individuals in this family might be at some increased risk of developing cancer, over the general population risk, due to the family history of cancer. We recommend women in this family have a yearly mammogram beginning at age 43, or 94 years younger than the earliest onset of cancer, an annual clinical breast exam, and perform monthly breast self-exams. We do not recommend  familial testing for the MLH1 variant of uncertain significance (VUS).  FOLLOW-UP:  Cancer genetics is a rapidly advancing field and it is possible that new genetic tests will be appropriate for her and/or her family members in the future. We encouraged her to remain in contact with cancer genetics on an annual basis so we can update her personal and family histories and let her know of advances in cancer genetics that may benefit this family.   Our contact number was provided. Ruth Stephenson's questions were answered to her satisfaction, and she knows she is welcome to call us at anytime with additional questions or concerns.   Lalla Brothers, MS, [facility] Genetic Counselor [email or web address] (P) [phone]

## 2023-04-20 NOTE — Consult Note (Signed)
[facility]    Date of Admission:  04/19/2023     Reason for Consult: cellulitis    Referring Provider: David Stall     Lines:  Peripheral iv's  Abx: 10/25-c linezolid  10/24 doxy 10/24 dapto        Assessment: 72 y.o. 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 Intermittent relative leukopenia nothing out of the ordinary at this time Appearance doesn't suggest nec-fasc or staphylococcal, and will change abx to cefazolin 10/24 admission Bcx ngtd Improved here with legs elevation and daptomycin   Risk factors for recurrent/prolonged duration of infection would be venous stasis. No obvious tinea pedis  I suspect she might have some degree of venous stasis dermatitis and her leg hasn't bee elevated well enough or compressed well enough leading to this admission  She has a lateral lower RLE eschar and reported that placed had rupture with white discharge so I'll cover mrsa component   #hiv/aids On dovato Previously saw dr Ninetta Lights Lab Results  Component Value Date   HIV1RNAQUANT Not Detected 12/16/2021   Lab Results  Component Value Date   CD4TCELL 20 (L) 10/03/2022   CD4TABS 151 (L) 10/03/2022  Cd4 cell percentage had been rising and stable @ 20% for the past 2 years Very compliant  Will continue dovato. No need for OI prophy. Cd4 % has bee around 20% for several  years  Plan: Stop iv abx Start linezolid 600 mg po bid Planned 2 weeks If patient improves tomorrow and ok to go home can discharge tomorrow otherwise can keep over the weekend and I can help arrange abx Monday Linezolid if her insurance doesn't cover, can be given with printed rx and use GoodRx.com to find cheapest pharmacy that would fill it (around 30-40 dollars) Dr Luciana Axe available over weekend as needed for question I'll be back Monday She has ID clinic f/u ([place] [facility] on 11/5 @ 130PM Discussed with primary team      ------------------------------------------------ Principal Problem:   Cellulitis of leg, right    HPI: Ruth Stephenson is a 72 y.o. 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 Rle cellulitis improved but worsened 2 days so presented 10/24 and admitted  Redness per her report spreaded from right distal lower ext to proximally at the knee level associated with increased swelling from normal venous stasis and pain. One area distal had ruptured and eschared over  No fever/chill  She doesn't wear compression stocking  Sees dr Ninetta Lights for hiv. On dovato. Well controlled. Stable 20% cd4 count. Not on OI prophylaxis. Compliant with dovato   On presentation: Afebrile; wbc 2.4 (was 6.5 on 10/18; intermittently relatively low) Ct tib-fib no abscess/fluid collection/air 10/24 bcx  ngtd       Family History  Problem Relation Age of Onset   Angioedema Mother    Cancer Mother 81 - 70       unknown type   Asthma Brother    Throat cancer Maternal Uncle    Angioedema Maternal Grandmother     Social History   Tobacco Use   Smoking status: Former    Current packs/day: 0.50    Average packs/day: 0.5 packs/day for 35.0 years (17.5 ttl pk-yrs)    Types: Cigarettes    Passive exposure: Past   Smokeless tobacco: Former  Building services engineer status: Never Used  Substance Use Topics   Alcohol use: Not Currently   Drug use: Not Currently    Comment: methadone for drug rehab    No Known Allergies  Review of Systems: ROS All Other ROS was negative, except mentioned above   Past Medical History:  Diagnosis Date   Angio-edema    Breast cancer (HCC)    Cancer (HCC)    Cancer of right colon (HCC) 10/24/2012   Discussed with h/o- no need for further f/u (08-2020)     Cirrhosis of liver (HCC)    had a liver u/s in 2014 that showed that she was cirrhotic.   Hepatitis C    treated ([place]) 2015   History of COVID-19 06/03/2020   HIV (human immunodeficiency virus infection) (HCC)    Pneumonia 03/15/2021   in hospital 02-2021 with Pneumococcal pna and non-adherence to meds.   Polysubstance abuse (HCC)    Tuberculosis    neg chest xray       Scheduled Meds:  dolutegravir-lamiVUDine  1 tablet Oral Daily   enoxaparin (LOVENOX) injection  40 mg Subcutaneous Q24H   methadone  23 mg Oral q AM   Continuous Infusions:  doxycycline (VIBRAMYCIN) IV     PRN Meds:.acetaminophen, fentaNYL (SUBLIMAZE) injection, oxyCODONE **OR** oxyCODONE   OBJECTIVE: Blood pressure (!) 141/73, pulse 65, temperature 98.2 F (36.8 C), temperature source Oral, resp. rate 18, height 5\' 3"  (1.6 m), weight 90.7 kg, SpO2 94%.  Physical Exam  Feeling well, most swelling/redness/pain had significantly improved over night on daptomycin General/constitutional: no distress,  pleasant HEENT: Normocephalic, PER, Conj Clear, EOMI, Oropharynx clear Neck supple CV: rrr no mrg Lungs: clear to auscultation, normal respiratory effort Abd: Soft, Nontender Ext/skin: bilateral lower ext hyperpigmentation and brawny/pitting edema; RLE with more edema and also erythematous hue and right lateral distal tib-fib area eschar. No fluctuance. Tender to palpation rle; warm. Skin wrinkling RLE though Neuro: nonfocal MSK: no peripheral joint swelling/tenderness/warmth; back spines nontender     Lab Results Lab Results  Component Value Date   WBC 2.4 (L) 04/19/2023   HGB 11.7 (L) 04/19/2023   HCT 36.4 04/19/2023   MCV 104.9 (H) 04/19/2023   PLT 102 (L) 04/19/2023    Lab Results  Component Value Date   CREATININE 0.71 04/20/2023   BUN 11 04/20/2023   NA 141 04/20/2023   K 5.0 04/20/2023   CL 106 04/20/2023   CO2 24 04/20/2023    Lab Results  Component Value Date   ALT 17 04/20/2023   AST 32 04/20/2023   ALKPHOS  70 04/20/2023   BILITOT 1.6 (H) 04/20/2023      Microbiology: Recent Results (from the past 240 hour(s))  Urine Culture     Status: None   Collection Time: 04/13/23 10:07 PM   Specimen: Urine, Random  Result Value Ref Range Status   Specimen Description   Final    URINE, RANDOM Performed at [facility], [address], [place], [place] [address]    Special Requests   Final    NONE Reflexed from [id number] Performed at [facility], [address], [place], [place] [address]    Culture   Final    NO GROWTH Performed at [facility], [address] [place], [place] [address]    Report Status 04/15/2023 FINAL  Final  Culture, blood (routine x 2)     Status: None (Preliminary result)   Collection Time: 04/19/23  3:57 PM   Specimen: BLOOD  Result Value Ref Range Status   Specimen Description   Final    BLOOD BLOOD RIGHT FOREARM Performed at [facility], [address],  [place], [place] [address]    Special Requests   Final    BOTTLES DRAWN AEROBIC AND ANAEROBIC Blood Culture results may not be optimal due to an inadequate volume of blood received in culture bottles Performed at [facility], [address] [place], [place] [address]    Culture   Final    NO GROWTH < 12 HOURS Performed at [facility], [address] [place], [place] [address]    Report Status PENDING  Incomplete  Culture, blood (routine x 2)     Status: None (Preliminary result)   Collection Time: 04/19/23  7:03 PM   Specimen: BLOOD  Result Value Ref Range Status   Specimen Description   Final    BLOOD RIGHT ANTECUBITAL Performed at [facility], [address] [place], [place] [address]    Special Requests   Final    BOTTLES DRAWN AEROBIC AND ANAEROBIC Blood Culture adequate volume Performed at [facility], [address] [place], [place] [address]    Culture   Final    NO GROWTH < 12 HOURS Performed at [facility], [address] [place], [place] [address]    Report Status PENDING  Incomplete     Serology:    Imaging: If present, new imagings (plain films, ct scans, and mri) have been personally visualized and interpreted; radiology reports have been reviewed. Decision making incorporated into the Impression / Recommendations.  10/24 ct tib-fib 1. Circumferential dermal thickening and subcutaneous edema, asymmetrically more severe along the medial aspect of the distal right foreleg at the level of the distal diaphysis of the tibia. No loculated subcutaneous fluid collection identified. 2. 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 3. 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 4. Mild prepatellar subcutaneous edema.    Raymondo Band,  MD [facility] [phone] pager    04/20/2023, 9:44 AM

## 2023-04-20 NOTE — Progress Notes (Signed)
Right lower extremity venous duplex has been completed. Preliminary results can be found in CV Proc through chart review.   04/20/23 12:12 PM Olen Cordial RVT

## 2023-04-20 NOTE — TOC Benefit Eligibility Note (Signed)
Pharmacy Patient Advocate Encounter  Insurance verification completed.    The patient is insured through [employer]. Patient has Medicare and is not eligible for a copay card, but may be able to apply for patient assistance, if available.    Ran test claim for Linezolid and the current 30 day co-pay is $0.00.   This test claim was processed through [facility]- copay amounts may vary at other pharmacies due to pharmacy/plan contracts, or as the patient moves through the different stages of their insurance plan.

## 2023-04-20 NOTE — TOC Initial Note (Signed)
Transition of Care [facility]) - Initial/Assessment Note   Patient Details  Name: Ruth Stephenson MRN: [id number] Date of Birth: [birth year]  [facility]) CM/SW Contact:    Ewing Schlein, LCSW Phone Number: 04/20/2023, 9:47 AM  Clinical Narrative: Patient is from home and is currently on IV antibiotics. TOC following for possible discharge needs.                 Expected Discharge Plan: Home/Self Care Barriers to Discharge: Continued Medical Work up  Expected Discharge Plan and Services In-house Referral: Clinical Social Work Living arrangements for the past 2 months: Single Family Home             DME Arranged: N/A DME Agency: NA  Prior Living Arrangements/Services Living arrangements for the past 2 months: Single Family Home Lives with:: Self Patient language and need for interpreter reviewed:: Yes Do you feel safe going back to the place where you live?: Yes      Need for Family Participation in Patient Care: No (Comment) Care giver support system in place?: Yes (comment) Criminal Activity/Legal Involvement Pertinent to Current Situation/Hospitalization: No - Comment as needed  Activities of Daily Living ADL Screening (condition at time of admission) Independently performs ADLs?: Yes (appropriate for developmental age) Is the patient deaf or have difficulty hearing?: No Does the patient have difficulty seeing, even when wearing glasses/contacts?: No Does the patient have difficulty concentrating, remembering, or making decisions?: No  Emotional Assessment Orientation: : Oriented to Self, Oriented to Place, Oriented to  Time, Oriented to Situation Alcohol / Substance Use: Not Applicable Psych Involvement: No (comment)  Admission diagnosis:  Cellulitis of right lower extremity [L03.115] Cellulitis of leg, right [L03.115] Patient Active Problem List   Diagnosis Date Noted   Cellulitis of leg, right 04/19/2023   Genetic testing 04/13/2023   Malignant neoplasm of  upper-outer quadrant of left female breast (HCC) 03/27/2023   Cellulitis of right leg 02/26/2023   DNR (do not resuscitate)/DNI(Do Not Intubate) 02/26/2023   Depression 09/02/2021   Dysequilibrium 03/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   Thrombocytopenia (HCC) 03/11/2021   Other cirrhosis of liver (HCC) 03/10/2021   Obesity (BMI 30-39.9) 07/01/2020   Claudication (HCC) 06/22/2020   Thoracic aortic aneurysm without rupture (HCC) 06/22/2020   Opioid dependence (HCC) 03/14/2020   Hernia of abdominal wall 07/10/2019   Bilateral edema of lower extremity 07/29/2014   History of colon cancer, stage II 07/29/2014   Neuropathy 05/27/2014   GERD (gastroesophageal reflux disease) 05/27/2014   History of pancreatitis 09/19/2012   AIDS (acquired immune deficiency syndrome) (HCC) 09/19/2012   Anemia 09/19/2012   PCP:  [name], MD Pharmacy:   [facility]- [place], [place] - [address] [place] [address] Phone: [phone] Fax: [phone]  [id number] - [place], [place] - [address] [facility] [address] [place] [address] Phone: [phone] Fax: [phone]  [place] [facility] [address] [place] [address] Phone: [phone] Fax: [phone]  [facility] [address] [place] [address] Phone: [phone] Fax: [phone]  Social Determinants of Health (SDOH) Social History: SDOH Screenings   Food Insecurity: No Food Insecurity (04/20/2023)  Housing: Low Risk  (04/20/2023)  Transportation Needs: No Transportation Needs (04/20/2023)  Utilities: Not At Risk (04/20/2023)  Alcohol Screen: Low Risk  (10/03/2022)  Depression (PHQ2-9): Low Risk  (10/03/2022)  Financial Resource Strain: Medium  Risk (10/03/2022)  Physical Activity: Sufficiently Active (10/03/2022)  Social Connections: Moderately Isolated (10/03/2022)  Stress: No  Stress Concern Present (10/03/2022)  Tobacco Use: Medium Risk (04/19/2023)   SDOH Interventions:    Readmission Risk Interventions    04/20/2023    9:45 AM 02/27/2023    3:55 PM  Readmission Risk Prevention Plan  Transportation Screening Complete Complete  PCP or Specialist Appt within 5-7 Days  Complete  Home Care Screening  Complete  Medication Review (RN CM)  Complete  HRI or Home Care Consult Complete   Social Work Consult for Recovery Care Planning/Counseling Complete   Palliative Care Screening Not Applicable   Medication Review Oceanographer) Complete

## 2023-04-20 NOTE — Progress Notes (Signed)
N, MD Inpatient         IV Access:   Peripheral IV   Procedures and diagnostic studies:   VAS [place] LOWER EXTREMITY VENOUS (DVT)  Result Date: 04/20/2023  Lower Venous DVT Study Patient Name:  Ruth Stephenson  Date of Exam:   04/20/2023 Medical Rec #: [id number]        Accession #:    [id number] Date of Birth: 06-07-1951        Patient Gender: F Patient Age:   72 years Exam Location:  [facility]  Procedure:      VAS [place] LOWER EXTREMITY VENOUS (DVT) Referring Phys: Dolly Rias --------------------------------------------------------------------------------  Indications: Swelling.  Risk Factors: None identified. Limitations: Poor ultrasound/tissue interface. Comparison Study: 04/13/2023 - RIGHT:                    - There is no evidence of deep vein thrombosis in the lower                   extremity.                    - No cystic structure found in the popliteal fossa.                   - Ultrasound characteristics of enlarged lymph nodes are noted                   in the                   groin.                    LEFT:                   - No evidence of common femoral vein obstruction. Performing Technologist: Chanda Busing RVT  Examination Guidelines: A complete evaluation includes B-mode imaging, spectral Doppler, color Doppler, and power Doppler as needed of all accessible portions of each vessel. Bilateral testing is considered an integral part of a complete examination. Limited examinations for reoccurring indications may be performed as noted. The reflux portion of the exam is performed with the patient in reverse Trendelenburg.  +---------+---------------+---------+-----------+----------+-------------------+ RIGHT    CompressibilityPhasicitySpontaneityPropertiesThrombus Aging      +---------+---------------+---------+-----------+----------+-------------------+ CFV      Full           Yes      Yes                                      +---------+---------------+---------+-----------+----------+-------------------+ SFJ      Full                                                             +---------+---------------+---------+-----------+----------+-------------------+ FV Prox  Full                                                             +---------+---------------+---------+-----------+----------+-------------------+ FV Mid   Full                                                              +---------+---------------+---------+-----------+----------+-------------------+  [facility] PROGRESS NOTE    Progress Note  Ruth Stephenson  ZOX:[id number] DOB: 1951-01-02 DOA: 04/19/2023 PCP: [name], MD     Brief Narrative:   Ruth Stephenson is an 72 y.o. 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   Assessment/Plan:   Cellulitis of leg, right CT of the tibia and fibula showed dermal thickening and subcutaneous edema of the right foreleg no abscess or fluid collections Started on IV vancomycin, lower extremity Doppler ESR CRP and A1c are pending. She is mildly leukopenic.  She has remained afebrile. Discontinue IV bank switch her to IV doxycycline.  The erythema has receded swelling is down. Consult infectious disease  Leukopenia: Question due to HIV with a follow-up with hematology as an outpatient possibly due to bone marrow biopsy. She has had leukopenia in the past since the beginning of last year.  Every time she gets an infection her white blood cell count rises to normal.  Thrombocytopenia, chronic: Follow up with hematology as an outpatient.  History of breast cancer: Follow-up with oncology as an outpatient.  Remote history of colon cancer: Noted.  HIV/AIDS: Continue Dovato, CD4 count is pending.  Last time was 151. There is likely due to noncompliance with her medication. Will get infectious disease involved she will need to be started on Bactrim for PCP prophylaxis.  History of opiate use: Continue methadone.   DVT prophylaxis:  lovenox Family Communication:none Status is: Inpatient Remains inpatient appropriate because: Right lower extremity cellulitis    Code Status:     Code Status Orders  (From admission, onward)           Start     Ordered   04/19/23 2327  Do not attempt resuscitation (DNR)- Limited -Do Not Intubate (DNI)  (Code Status)  Continuous       Question Answer Comment  If pulseless and not breathing No CPR or chest compressions.   In Pre-Arrest Conditions (Patient Is Breathing and Has A Pulse) Do not intubate. Provide all appropriate non-invasive medical interventions. Avoid ICU transfer unless indicated or required.   Consent: Discussion documented in EHR or advanced directives reviewed      04/19/23 2326           Code Status History     Date Active Date Inactive Code Status Order ID Comments User Context   04/19/2023 2242 04/19/2023 2326 Full Code [id number]  Dolly Rias, MD ED   02/26/2023 2203 03/01/2023 1857 Limited: Do not attempt resuscitation (DNR) -DNR-LIMITED -Do Not Intubate/DNI  [id number]  Carollee Herter, DO ED   02/26/2023 2143 02/26/2023 2203 Limited: Do not attempt resuscitation (DNR) -DNR-LIMITED -Do Not Intubate/DNI  [id number]  Carollee Herter, DO ED   03/11/2021 0009 03/14/2021 1652 Full Code [id number]  Therisa Doyne, MD ED   03/15/2020 0045 03/18/2020 1946 Full Code [id number]  Delano Metz, MD Inpatient   05/04/2017 1943 05/09/2017 1811 Full Code [id number]  Tobey Grim, NP ED   09/19/2012 0512 10/09/2012 2127 Full Code [id number]  Midge Minium  N, MD Inpatient         IV Access:   Peripheral IV   Procedures and diagnostic studies:   VAS [place] LOWER EXTREMITY VENOUS (DVT)  Result Date: 04/20/2023  Lower Venous DVT Study Patient Name:  Ruth Stephenson  Date of Exam:   04/20/2023 Medical Rec #: [id number]        Accession #:    [id number] Date of Birth: 06-07-1951        Patient Gender: F Patient Age:   72 years Exam Location:  [facility]  Procedure:      VAS [place] LOWER EXTREMITY VENOUS (DVT) Referring Phys: Dolly Rias --------------------------------------------------------------------------------  Indications: Swelling.  Risk Factors: None identified. Limitations: Poor ultrasound/tissue interface. Comparison Study: 04/13/2023 - RIGHT:                    - There is no evidence of deep vein thrombosis in the lower                   extremity.                    - No cystic structure found in the popliteal fossa.                   - Ultrasound characteristics of enlarged lymph nodes are noted                   in the                   groin.                    LEFT:                   - No evidence of common femoral vein obstruction. Performing Technologist: Chanda Busing RVT  Examination Guidelines: A complete evaluation includes B-mode imaging, spectral Doppler, color Doppler, and power Doppler as needed of all accessible portions of each vessel. Bilateral testing is considered an integral part of a complete examination. Limited examinations for reoccurring indications may be performed as noted. The reflux portion of the exam is performed with the patient in reverse Trendelenburg.  +---------+---------------+---------+-----------+----------+-------------------+ RIGHT    CompressibilityPhasicitySpontaneityPropertiesThrombus Aging      +---------+---------------+---------+-----------+----------+-------------------+ CFV      Full           Yes      Yes                                      +---------+---------------+---------+-----------+----------+-------------------+ SFJ      Full                                                             +---------+---------------+---------+-----------+----------+-------------------+ FV Prox  Full                                                             +---------+---------------+---------+-----------+----------+-------------------+ FV Mid   Full                                                              +---------+---------------+---------+-----------+----------+-------------------+  [facility] PROGRESS NOTE    Progress Note  Ruth Stephenson  ZOX:[id number] DOB: 1951-01-02 DOA: 04/19/2023 PCP: [name], MD     Brief Narrative:   Ruth Stephenson is an 72 y.o. 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   Assessment/Plan:   Cellulitis of leg, right CT of the tibia and fibula showed dermal thickening and subcutaneous edema of the right foreleg no abscess or fluid collections Started on IV vancomycin, lower extremity Doppler ESR CRP and A1c are pending. She is mildly leukopenic.  She has remained afebrile. Discontinue IV bank switch her to IV doxycycline.  The erythema has receded swelling is down. Consult infectious disease  Leukopenia: Question due to HIV with a follow-up with hematology as an outpatient possibly due to bone marrow biopsy. She has had leukopenia in the past since the beginning of last year.  Every time she gets an infection her white blood cell count rises to normal.  Thrombocytopenia, chronic: Follow up with hematology as an outpatient.  History of breast cancer: Follow-up with oncology as an outpatient.  Remote history of colon cancer: Noted.  HIV/AIDS: Continue Dovato, CD4 count is pending.  Last time was 151. There is likely due to noncompliance with her medication. Will get infectious disease involved she will need to be started on Bactrim for PCP prophylaxis.  History of opiate use: Continue methadone.   DVT prophylaxis:  lovenox Family Communication:none Status is: Inpatient Remains inpatient appropriate because: Right lower extremity cellulitis    Code Status:     Code Status Orders  (From admission, onward)           Start     Ordered   04/19/23 2327  Do not attempt resuscitation (DNR)- Limited -Do Not Intubate (DNI)  (Code Status)  Continuous       Question Answer Comment  If pulseless and not breathing No CPR or chest compressions.   In Pre-Arrest Conditions (Patient Is Breathing and Has A Pulse) Do not intubate. Provide all appropriate non-invasive medical interventions. Avoid ICU transfer unless indicated or required.   Consent: Discussion documented in EHR or advanced directives reviewed      04/19/23 2326           Code Status History     Date Active Date Inactive Code Status Order ID Comments User Context   04/19/2023 2242 04/19/2023 2326 Full Code [id number]  Dolly Rias, MD ED   02/26/2023 2203 03/01/2023 1857 Limited: Do not attempt resuscitation (DNR) -DNR-LIMITED -Do Not Intubate/DNI  [id number]  Carollee Herter, DO ED   02/26/2023 2143 02/26/2023 2203 Limited: Do not attempt resuscitation (DNR) -DNR-LIMITED -Do Not Intubate/DNI  [id number]  Carollee Herter, DO ED   03/11/2021 0009 03/14/2021 1652 Full Code [id number]  Therisa Doyne, MD ED   03/15/2020 0045 03/18/2020 1946 Full Code [id number]  Delano Metz, MD Inpatient   05/04/2017 1943 05/09/2017 1811 Full Code [id number]  Tobey Grim, NP ED   09/19/2012 0512 10/09/2012 2127 Full Code [id number]  Midge Minium  N, MD Inpatient         IV Access:   Peripheral IV   Procedures and diagnostic studies:   VAS [place] LOWER EXTREMITY VENOUS (DVT)  Result Date: 04/20/2023  Lower Venous DVT Study Patient Name:  Ruth Stephenson  Date of Exam:   04/20/2023 Medical Rec #: [id number]        Accession #:    [id number] Date of Birth: 06-07-1951        Patient Gender: F Patient Age:   72 years Exam Location:  [facility]  Procedure:      VAS [place] LOWER EXTREMITY VENOUS (DVT) Referring Phys: Dolly Rias --------------------------------------------------------------------------------  Indications: Swelling.  Risk Factors: None identified. Limitations: Poor ultrasound/tissue interface. Comparison Study: 04/13/2023 - RIGHT:                    - There is no evidence of deep vein thrombosis in the lower                   extremity.                    - No cystic structure found in the popliteal fossa.                   - Ultrasound characteristics of enlarged lymph nodes are noted                   in the                   groin.                    LEFT:                   - No evidence of common femoral vein obstruction. Performing Technologist: Chanda Busing RVT  Examination Guidelines: A complete evaluation includes B-mode imaging, spectral Doppler, color Doppler, and power Doppler as needed of all accessible portions of each vessel. Bilateral testing is considered an integral part of a complete examination. Limited examinations for reoccurring indications may be performed as noted. The reflux portion of the exam is performed with the patient in reverse Trendelenburg.  +---------+---------------+---------+-----------+----------+-------------------+ RIGHT    CompressibilityPhasicitySpontaneityPropertiesThrombus Aging      +---------+---------------+---------+-----------+----------+-------------------+ CFV      Full           Yes      Yes                                      +---------+---------------+---------+-----------+----------+-------------------+ SFJ      Full                                                             +---------+---------------+---------+-----------+----------+-------------------+ FV Prox  Full                                                             +---------+---------------+---------+-----------+----------+-------------------+ FV Mid   Full                                                              +---------+---------------+---------+-----------+----------+-------------------+  [facility] PROGRESS NOTE    Progress Note  Ruth Stephenson  ZOX:[id number] DOB: 1951-01-02 DOA: 04/19/2023 PCP: [name], MD     Brief Narrative:   Ruth Stephenson is an 72 y.o. 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   Assessment/Plan:   Cellulitis of leg, right CT of the tibia and fibula showed dermal thickening and subcutaneous edema of the right foreleg no abscess or fluid collections Started on IV vancomycin, lower extremity Doppler ESR CRP and A1c are pending. She is mildly leukopenic.  She has remained afebrile. Discontinue IV bank switch her to IV doxycycline.  The erythema has receded swelling is down. Consult infectious disease  Leukopenia: Question due to HIV with a follow-up with hematology as an outpatient possibly due to bone marrow biopsy. She has had leukopenia in the past since the beginning of last year.  Every time she gets an infection her white blood cell count rises to normal.  Thrombocytopenia, chronic: Follow up with hematology as an outpatient.  History of breast cancer: Follow-up with oncology as an outpatient.  Remote history of colon cancer: Noted.  HIV/AIDS: Continue Dovato, CD4 count is pending.  Last time was 151. There is likely due to noncompliance with her medication. Will get infectious disease involved she will need to be started on Bactrim for PCP prophylaxis.  History of opiate use: Continue methadone.   DVT prophylaxis:  lovenox Family Communication:none Status is: Inpatient Remains inpatient appropriate because: Right lower extremity cellulitis    Code Status:     Code Status Orders  (From admission, onward)           Start     Ordered   04/19/23 2327  Do not attempt resuscitation (DNR)- Limited -Do Not Intubate (DNI)  (Code Status)  Continuous       Question Answer Comment  If pulseless and not breathing No CPR or chest compressions.   In Pre-Arrest Conditions (Patient Is Breathing and Has A Pulse) Do not intubate. Provide all appropriate non-invasive medical interventions. Avoid ICU transfer unless indicated or required.   Consent: Discussion documented in EHR or advanced directives reviewed      04/19/23 2326           Code Status History     Date Active Date Inactive Code Status Order ID Comments User Context   04/19/2023 2242 04/19/2023 2326 Full Code [id number]  Dolly Rias, MD ED   02/26/2023 2203 03/01/2023 1857 Limited: Do not attempt resuscitation (DNR) -DNR-LIMITED -Do Not Intubate/DNI  [id number]  Carollee Herter, DO ED   02/26/2023 2143 02/26/2023 2203 Limited: Do not attempt resuscitation (DNR) -DNR-LIMITED -Do Not Intubate/DNI  [id number]  Carollee Herter, DO ED   03/11/2021 0009 03/14/2021 1652 Full Code [id number]  Therisa Doyne, MD ED   03/15/2020 0045 03/18/2020 1946 Full Code [id number]  Delano Metz, MD Inpatient   05/04/2017 1943 05/09/2017 1811 Full Code [id number]  Tobey Grim, NP ED   09/19/2012 0512 10/09/2012 2127 Full Code [id number]  Midge Minium

## 2023-04-20 NOTE — Plan of Care (Signed)
Alert and oriented. Redness marked on right lower extremity.  Medicated for pain with PRN medication, see MAR for details.   Problem: Education: Goal: Knowledge of General Education information will improve Description: Including pain rating scale, medication(s)/side effects and non-pharmacologic comfort measures Outcome: Progressing   Problem: Health Behavior/Discharge Planning: Goal: Ability to manage health-related needs will improve Outcome: Progressing   Problem: Clinical Measurements: Goal: Ability to maintain clinical measurements within normal limits will improve Outcome: Progressing   Problem: Pain Management: Goal: General experience of comfort will improve Outcome: Progressing

## 2023-04-20 NOTE — Plan of Care (Signed)
  Problem: Education: Goal: Knowledge of General Education information will improve Description: Including pain rating scale, medication(s)/side effects and non-pharmacologic comfort measures Outcome: Progressing   Problem: Activity: Goal: Risk for activity intolerance will decrease Outcome: Progressing   Problem: Nutrition: Goal: Adequate nutrition will be maintained Outcome: Progressing   Problem: Pain Management: Goal: General experience of comfort will improve Outcome: Progressing

## 2023-04-20 NOTE — Progress Notes (Signed)
Wasted 2.79ml of methadone in [place] cycle. Witnessed by Marni Griffon RN

## 2023-04-20 NOTE — Plan of Care (Signed)
  Problem: Coping: Goal: Level of anxiety will decrease Outcome: Progressing   Problem: Pain Management: Goal: General experience of comfort will improve Outcome: Progressing

## 2023-04-21 DIAGNOSIS — D709 Neutropenia, unspecified: Secondary | ICD-10-CM | POA: Diagnosis not present

## 2023-04-21 DIAGNOSIS — L03115 Cellulitis of right lower limb: Secondary | ICD-10-CM | POA: Diagnosis not present

## 2023-04-21 DIAGNOSIS — D696 Thrombocytopenia, unspecified: Secondary | ICD-10-CM | POA: Diagnosis not present

## 2023-04-21 NOTE — Progress Notes (Signed)
04/20/2023 Medical Rec #: [id number]        Accession #:    [id number] Date of Birth: [birth year]        Patient Gender: F Patient Age:   72 years Exam Location:  [facility] Procedure:      VAS [place] LOWER EXTREMITY VENOUS (DVT) Referring Phys: Dolly Rias --------------------------------------------------------------------------------  Indications: Swelling.  Risk Factors: None identified. Limitations: Poor ultrasound/tissue interface. Comparison  Study: 04/13/2023 - RIGHT:                    - There is no evidence of deep vein thrombosis in the lower                   extremity.                    - No cystic structure found in the popliteal fossa.                   - Ultrasound characteristics of enlarged lymph nodes are noted                   in the                   groin.                    LEFT:                   - No evidence of common femoral vein obstruction. Performing Technologist: Chanda Busing RVT  Examination Guidelines: A complete evaluation includes B-mode imaging, spectral Doppler, color Doppler, and power Doppler as needed of all accessible portions of each vessel. Bilateral testing is considered an integral part of a complete examination. Limited examinations for reoccurring indications may be performed as noted. The reflux portion of the exam is performed with the patient in reverse Trendelenburg.  +---------+---------------+---------+-----------+----------+-------------------+ RIGHT    CompressibilityPhasicitySpontaneityPropertiesThrombus Aging      +---------+---------------+---------+-----------+----------+-------------------+ CFV      Full           Yes      Yes                                      +---------+---------------+---------+-----------+----------+-------------------+ SFJ      Full                                                             +---------+---------------+---------+-----------+----------+-------------------+ FV Prox  Full                                                             +---------+---------------+---------+-----------+----------+-------------------+ FV Mid   Full                                                             +---------+---------------+---------+-----------+----------+-------------------+  04/20/2023 Medical Rec #: [id number]        Accession #:    [id number] Date of Birth: [birth year]        Patient Gender: F Patient Age:   72 years Exam Location:  [facility] Procedure:      VAS [place] LOWER EXTREMITY VENOUS (DVT) Referring Phys: Dolly Rias --------------------------------------------------------------------------------  Indications: Swelling.  Risk Factors: None identified. Limitations: Poor ultrasound/tissue interface. Comparison  Study: 04/13/2023 - RIGHT:                    - There is no evidence of deep vein thrombosis in the lower                   extremity.                    - No cystic structure found in the popliteal fossa.                   - Ultrasound characteristics of enlarged lymph nodes are noted                   in the                   groin.                    LEFT:                   - No evidence of common femoral vein obstruction. Performing Technologist: Chanda Busing RVT  Examination Guidelines: A complete evaluation includes B-mode imaging, spectral Doppler, color Doppler, and power Doppler as needed of all accessible portions of each vessel. Bilateral testing is considered an integral part of a complete examination. Limited examinations for reoccurring indications may be performed as noted. The reflux portion of the exam is performed with the patient in reverse Trendelenburg.  +---------+---------------+---------+-----------+----------+-------------------+ RIGHT    CompressibilityPhasicitySpontaneityPropertiesThrombus Aging      +---------+---------------+---------+-----------+----------+-------------------+ CFV      Full           Yes      Yes                                      +---------+---------------+---------+-----------+----------+-------------------+ SFJ      Full                                                             +---------+---------------+---------+-----------+----------+-------------------+ FV Prox  Full                                                             +---------+---------------+---------+-----------+----------+-------------------+ FV Mid   Full                                                             +---------+---------------+---------+-----------+----------+-------------------+  04/20/2023 Medical Rec #: [id number]        Accession #:    [id number] Date of Birth: [birth year]        Patient Gender: F Patient Age:   72 years Exam Location:  [facility] Procedure:      VAS [place] LOWER EXTREMITY VENOUS (DVT) Referring Phys: Dolly Rias --------------------------------------------------------------------------------  Indications: Swelling.  Risk Factors: None identified. Limitations: Poor ultrasound/tissue interface. Comparison  Study: 04/13/2023 - RIGHT:                    - There is no evidence of deep vein thrombosis in the lower                   extremity.                    - No cystic structure found in the popliteal fossa.                   - Ultrasound characteristics of enlarged lymph nodes are noted                   in the                   groin.                    LEFT:                   - No evidence of common femoral vein obstruction. Performing Technologist: Chanda Busing RVT  Examination Guidelines: A complete evaluation includes B-mode imaging, spectral Doppler, color Doppler, and power Doppler as needed of all accessible portions of each vessel. Bilateral testing is considered an integral part of a complete examination. Limited examinations for reoccurring indications may be performed as noted. The reflux portion of the exam is performed with the patient in reverse Trendelenburg.  +---------+---------------+---------+-----------+----------+-------------------+ RIGHT    CompressibilityPhasicitySpontaneityPropertiesThrombus Aging      +---------+---------------+---------+-----------+----------+-------------------+ CFV      Full           Yes      Yes                                      +---------+---------------+---------+-----------+----------+-------------------+ SFJ      Full                                                             +---------+---------------+---------+-----------+----------+-------------------+ FV Prox  Full                                                             +---------+---------------+---------+-----------+----------+-------------------+ FV Mid   Full                                                             +---------+---------------+---------+-----------+----------+-------------------+  [facility] PROGRESS NOTE    Progress Note  Ruth Stephenson  UVO:[id number] DOB: [birth year] DOA: 04/19/2023 PCP: [name], MD     Brief Narrative:   Ruth Stephenson is an 72 y.o. 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   Assessment/Plan:   Cellulitis of leg, right CT of the tibia and fibula showed dermal thickening and subcutaneous edema of the right foreleg no abscess or fluid collections. ID was consulted recommended oral Erythema has receded.  Leukopenia: Question due to HIV, will need follow-up with hematology as an outpatient possibly due to bone marrow biopsy. Leukopenia continues to worsen today is 1.9, continue to monitor.  Thrombocytopenia, chronic: Follow up with hematology as an outpatient.  History of breast cancer: Follow-up with oncology as an outpatient.  Remote history of colon cancer: Noted.  HIV/AIDS: Continue Dovato, CD4 count is pending.  Last time was 151. There is likely due to noncompliance with her medication. Will get infectious disease involved she will need to be started on Bactrim for PCP prophylaxis.  History of opiate use: Continue methadone.   DVT prophylaxis: lovenox Family Communication:none Status is: Inpatient Remains inpatient appropriate because: Right lower extremity cellulitis    Code Status:     Code Status Orders  (From admission, onward)           Start     Ordered   04/19/23 2327  Do not attempt  resuscitation (DNR)- Limited -Do Not Intubate (DNI)  (Code Status)  Continuous       Question Answer Comment  If pulseless and not breathing No CPR or chest compressions.   In Pre-Arrest Conditions (Patient Is Breathing and Has A Pulse) Do not intubate. Provide all appropriate non-invasive medical interventions. Avoid ICU transfer unless indicated or required.   Consent: Discussion documented in EHR or advanced directives reviewed      04/19/23 2326           Code Status History     Date Active Date Inactive Code Status Order ID Comments User Context   04/19/2023 2242 04/19/2023 2326 Full Code [id number]  Dolly Rias, MD ED   02/26/2023 2203 03/01/2023 1857 Limited: Do not attempt resuscitation (DNR) -DNR-LIMITED -Do Not Intubate/DNI  [id number]  Carollee Herter, DO ED   02/26/2023 2143 02/26/2023 2203 Limited: Do not attempt resuscitation (DNR) -DNR-LIMITED -Do Not Intubate/DNI  [id number]  Carollee Herter, DO ED   03/11/2021 0009 03/14/2021 1652 Full Code [id number]  Therisa Doyne, MD ED   03/15/2020 0045 03/18/2020 1946 Full Code [id number]  Delano Metz, MD Inpatient   05/04/2017 1943 05/09/2017 1811 Full Code [id number]  Tobey Grim, NP ED   09/19/2012 0512 10/09/2012 2127 Full Code [id number]  Eduard Clos, MD Inpatient         IV Access:   Peripheral IV   Procedures and diagnostic studies:   VAS [place] LOWER EXTREMITY VENOUS (DVT)  Result Date: 04/20/2023  Lower Venous DVT Study Patient Name:  Ruth Stephenson  Date of Exam:  [facility] PROGRESS NOTE    Progress Note  Ruth Stephenson  UVO:[id number] DOB: [birth year] DOA: 04/19/2023 PCP: [name], MD     Brief Narrative:   Ruth Stephenson is an 72 y.o. 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   Assessment/Plan:   Cellulitis of leg, right CT of the tibia and fibula showed dermal thickening and subcutaneous edema of the right foreleg no abscess or fluid collections. ID was consulted recommended oral Erythema has receded.  Leukopenia: Question due to HIV, will need follow-up with hematology as an outpatient possibly due to bone marrow biopsy. Leukopenia continues to worsen today is 1.9, continue to monitor.  Thrombocytopenia, chronic: Follow up with hematology as an outpatient.  History of breast cancer: Follow-up with oncology as an outpatient.  Remote history of colon cancer: Noted.  HIV/AIDS: Continue Dovato, CD4 count is pending.  Last time was 151. There is likely due to noncompliance with her medication. Will get infectious disease involved she will need to be started on Bactrim for PCP prophylaxis.  History of opiate use: Continue methadone.   DVT prophylaxis: lovenox Family Communication:none Status is: Inpatient Remains inpatient appropriate because: Right lower extremity cellulitis    Code Status:     Code Status Orders  (From admission, onward)           Start     Ordered   04/19/23 2327  Do not attempt  resuscitation (DNR)- Limited -Do Not Intubate (DNI)  (Code Status)  Continuous       Question Answer Comment  If pulseless and not breathing No CPR or chest compressions.   In Pre-Arrest Conditions (Patient Is Breathing and Has A Pulse) Do not intubate. Provide all appropriate non-invasive medical interventions. Avoid ICU transfer unless indicated or required.   Consent: Discussion documented in EHR or advanced directives reviewed      04/19/23 2326           Code Status History     Date Active Date Inactive Code Status Order ID Comments User Context   04/19/2023 2242 04/19/2023 2326 Full Code [id number]  Dolly Rias, MD ED   02/26/2023 2203 03/01/2023 1857 Limited: Do not attempt resuscitation (DNR) -DNR-LIMITED -Do Not Intubate/DNI  [id number]  Carollee Herter, DO ED   02/26/2023 2143 02/26/2023 2203 Limited: Do not attempt resuscitation (DNR) -DNR-LIMITED -Do Not Intubate/DNI  [id number]  Carollee Herter, DO ED   03/11/2021 0009 03/14/2021 1652 Full Code [id number]  Therisa Doyne, MD ED   03/15/2020 0045 03/18/2020 1946 Full Code [id number]  Delano Metz, MD Inpatient   05/04/2017 1943 05/09/2017 1811 Full Code [id number]  Tobey Grim, NP ED   09/19/2012 0512 10/09/2012 2127 Full Code [id number]  Eduard Clos, MD Inpatient         IV Access:   Peripheral IV   Procedures and diagnostic studies:   VAS [place] LOWER EXTREMITY VENOUS (DVT)  Result Date: 04/20/2023  Lower Venous DVT Study Patient Name:  Ruth Stephenson  Date of Exam:  [facility] PROGRESS NOTE    Progress Note  Ruth Stephenson  UVO:[id number] DOB: [birth year] DOA: 04/19/2023 PCP: [name], MD     Brief Narrative:   Ruth Stephenson is an 72 y.o. 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   Assessment/Plan:   Cellulitis of leg, right CT of the tibia and fibula showed dermal thickening and subcutaneous edema of the right foreleg no abscess or fluid collections. ID was consulted recommended oral Erythema has receded.  Leukopenia: Question due to HIV, will need follow-up with hematology as an outpatient possibly due to bone marrow biopsy. Leukopenia continues to worsen today is 1.9, continue to monitor.  Thrombocytopenia, chronic: Follow up with hematology as an outpatient.  History of breast cancer: Follow-up with oncology as an outpatient.  Remote history of colon cancer: Noted.  HIV/AIDS: Continue Dovato, CD4 count is pending.  Last time was 151. There is likely due to noncompliance with her medication. Will get infectious disease involved she will need to be started on Bactrim for PCP prophylaxis.  History of opiate use: Continue methadone.   DVT prophylaxis: lovenox Family Communication:none Status is: Inpatient Remains inpatient appropriate because: Right lower extremity cellulitis    Code Status:     Code Status Orders  (From admission, onward)           Start     Ordered   04/19/23 2327  Do not attempt  resuscitation (DNR)- Limited -Do Not Intubate (DNI)  (Code Status)  Continuous       Question Answer Comment  If pulseless and not breathing No CPR or chest compressions.   In Pre-Arrest Conditions (Patient Is Breathing and Has A Pulse) Do not intubate. Provide all appropriate non-invasive medical interventions. Avoid ICU transfer unless indicated or required.   Consent: Discussion documented in EHR or advanced directives reviewed      04/19/23 2326           Code Status History     Date Active Date Inactive Code Status Order ID Comments User Context   04/19/2023 2242 04/19/2023 2326 Full Code [id number]  Dolly Rias, MD ED   02/26/2023 2203 03/01/2023 1857 Limited: Do not attempt resuscitation (DNR) -DNR-LIMITED -Do Not Intubate/DNI  [id number]  Carollee Herter, DO ED   02/26/2023 2143 02/26/2023 2203 Limited: Do not attempt resuscitation (DNR) -DNR-LIMITED -Do Not Intubate/DNI  [id number]  Carollee Herter, DO ED   03/11/2021 0009 03/14/2021 1652 Full Code [id number]  Therisa Doyne, MD ED   03/15/2020 0045 03/18/2020 1946 Full Code [id number]  Delano Metz, MD Inpatient   05/04/2017 1943 05/09/2017 1811 Full Code [id number]  Tobey Grim, NP ED   09/19/2012 0512 10/09/2012 2127 Full Code [id number]  Eduard Clos, MD Inpatient         IV Access:   Peripheral IV   Procedures and diagnostic studies:   VAS [place] LOWER EXTREMITY VENOUS (DVT)  Result Date: 04/20/2023  Lower Venous DVT Study Patient Name:  Ruth Stephenson  Date of Exam:

## 2023-04-22 DIAGNOSIS — L03115 Cellulitis of right lower limb: Secondary | ICD-10-CM | POA: Diagnosis not present

## 2023-04-22 LAB — CBC
HCT: 42.9 % (ref 36.0–46.0)
Hemoglobin: 13.9 g/dL (ref 12.0–15.0)
MCH: 33.7 pg (ref 26.0–34.0)
MCHC: 32.4 g/dL (ref 30.0–36.0)
MCV: 104.1 fL — ABNORMAL HIGH (ref 80.0–100.0)
Platelets: 131 10*3/uL — ABNORMAL LOW (ref 150–400)
RBC: 4.12 MIL/uL (ref 3.87–5.11)
RDW: 13 % (ref 11.5–15.5)
WBC: 2.2 10*3/uL — ABNORMAL LOW (ref 4.0–10.5)
nRBC: 0 % (ref 0.0–0.2)

## 2023-04-22 MED ORDER — LINEZOLID 600 MG PO TABS: 600.0000 mg | ORAL_TABLET | Freq: Two times a day (BID) | ORAL | 0 refills | Status: AC

## 2023-04-22 NOTE — Plan of Care (Signed)

## 2023-04-22 NOTE — Plan of Care (Signed)
  Problem: Coping: Goal: Level of anxiety will decrease Outcome: Progressing   Problem: Pain Management: Goal: General experience of comfort will improve Outcome: Progressing

## 2023-04-22 NOTE — Discharge Summary (Signed)
Physician Discharge Summary  Ruth Stephenson:[id number] DOB: 11/02/1950 DOA: 04/19/2023  PCP: [name], MD  Admit date: 04/19/2023 Discharge date: 04/22/2023  Admitted From: Home Disposition:  Home  Recommendations for Outpatient Follow-up:  Follow up with PCP in 1-2 weeks Please obtain BMP/CBC in one week   Home Health:No Equipment/Devices:None  Discharge Condition:Stable CODE STATUS:Ful  Diet recommendation: Heart Healthy   Brief/Interim Summary:  72 y.o. 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    Discharge Diagnoses:  Principal Problem:   Cellulitis of leg, right  Right lower extremity cellulitis: Imaging showed no osteomyelitis or fluid collection. ID was consulted recommended to switch her to a linezolid which she will continue as an outpatient Erythema receded and her swelling decreased.  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 On the day of discharge her white blood cell started to trend up at 2.2.  Chronic thrombocytopenia:  Follow-up with hematology as an outpatient.    history of breast cancer Follow-up with hematology as an outpatient.  Remote history of colon cancer:  noted.  HIV/AIDS: Continue current medication no changes made. To follow-up with ID as an outpatient. Discharge Instructions  Discharge Instructions     Diet - low sodium heart healthy   Complete by: As  directed    Increase activity slowly   Complete by: As directed       Allergies as of 04/22/2023   No Known Allergies      Medication List     STOP taking these medications    acetaminophen 325 MG tablet Commonly known as: TYLENOL   bisacodyl 5 MG EC tablet Generic drug: bisacodyl   ondansetron 4 MG tablet Commonly known as: ZOFRAN       TAKE these medications    Dovato 50-300 MG tablet Generic drug: dolutegravir-lamiVUDine TAKE 1 TABLET BY MOUTH EVERY DAY   GINKGO BILOBA PLUS PO Take 120 mg by mouth daily.   linezolid 600 MG tablet Commonly known as: ZYVOX Take 1 tablet (600 mg total) by mouth every 12 (twelve) hours for 7 days.   methadone 10 MG/5ML solution Commonly known as: DOLOPHINE Take 23 mg by mouth in the morning.   oxyCODONE 5 MG immediate release tablet Commonly known as: Oxy IR/ROXICODONE Take 1 tablet (5 mg total) by mouth every 6 (six) hours as needed for severe pain (pain score 7-10).   Vitamin D3 50 MCG (2000 UT) capsule Take 2,000 Units by mouth daily.   Vitamin K2 100 MCG Tabs Take 100 mg by mouth daily.        No Known Allergies  Consultations: Infectious disease   Procedures/Studies: VAS [place] LOWER EXTREMITY VENOUS (DVT)  Result Date: 04/20/2023  Lower Venous DVT Study Patient Name:  ANGELAMARIE Stephenson  Date of Exam:   04/20/2023 Medical Rec #: [id number]        Accession #:    [id number] Date of Birth: Apr 23, 1951  Physician Discharge Summary  Ruth Stephenson:[id number] DOB: 11/02/1950 DOA: 04/19/2023  PCP: [name], MD  Admit date: 04/19/2023 Discharge date: 04/22/2023  Admitted From: Home Disposition:  Home  Recommendations for Outpatient Follow-up:  Follow up with PCP in 1-2 weeks Please obtain BMP/CBC in one week   Home Health:No Equipment/Devices:None  Discharge Condition:Stable CODE STATUS:Ful  Diet recommendation: Heart Healthy   Brief/Interim Summary:  72 y.o. 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    Discharge Diagnoses:  Principal Problem:   Cellulitis of leg, right  Right lower extremity cellulitis: Imaging showed no osteomyelitis or fluid collection. ID was consulted recommended to switch her to a linezolid which she will continue as an outpatient Erythema receded and her swelling decreased.  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 On the day of discharge her white blood cell started to trend up at 2.2.  Chronic thrombocytopenia:  Follow-up with hematology as an outpatient.    history of breast cancer Follow-up with hematology as an outpatient.  Remote history of colon cancer:  noted.  HIV/AIDS: Continue current medication no changes made. To follow-up with ID as an outpatient. Discharge Instructions  Discharge Instructions     Diet - low sodium heart healthy   Complete by: As  directed    Increase activity slowly   Complete by: As directed       Allergies as of 04/22/2023   No Known Allergies      Medication List     STOP taking these medications    acetaminophen 325 MG tablet Commonly known as: TYLENOL   bisacodyl 5 MG EC tablet Generic drug: bisacodyl   ondansetron 4 MG tablet Commonly known as: ZOFRAN       TAKE these medications    Dovato 50-300 MG tablet Generic drug: dolutegravir-lamiVUDine TAKE 1 TABLET BY MOUTH EVERY DAY   GINKGO BILOBA PLUS PO Take 120 mg by mouth daily.   linezolid 600 MG tablet Commonly known as: ZYVOX Take 1 tablet (600 mg total) by mouth every 12 (twelve) hours for 7 days.   methadone 10 MG/5ML solution Commonly known as: DOLOPHINE Take 23 mg by mouth in the morning.   oxyCODONE 5 MG immediate release tablet Commonly known as: Oxy IR/ROXICODONE Take 1 tablet (5 mg total) by mouth every 6 (six) hours as needed for severe pain (pain score 7-10).   Vitamin D3 50 MCG (2000 UT) capsule Take 2,000 Units by mouth daily.   Vitamin K2 100 MCG Tabs Take 100 mg by mouth daily.        No Known Allergies  Consultations: Infectious disease   Procedures/Studies: VAS [place] LOWER EXTREMITY VENOUS (DVT)  Result Date: 04/20/2023  Lower Venous DVT Study Patient Name:  ANGELAMARIE Stephenson  Date of Exam:   04/20/2023 Medical Rec #: [id number]        Accession #:    [id number] Date of Birth: Apr 23, 1951  Physician Discharge Summary  Ruth Stephenson:[id number] DOB: 11/02/1950 DOA: 04/19/2023  PCP: [name], MD  Admit date: 04/19/2023 Discharge date: 04/22/2023  Admitted From: Home Disposition:  Home  Recommendations for Outpatient Follow-up:  Follow up with PCP in 1-2 weeks Please obtain BMP/CBC in one week   Home Health:No Equipment/Devices:None  Discharge Condition:Stable CODE STATUS:Ful  Diet recommendation: Heart Healthy   Brief/Interim Summary:  72 y.o. 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    Discharge Diagnoses:  Principal Problem:   Cellulitis of leg, right  Right lower extremity cellulitis: Imaging showed no osteomyelitis or fluid collection. ID was consulted recommended to switch her to a linezolid which she will continue as an outpatient Erythema receded and her swelling decreased.  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 On the day of discharge her white blood cell started to trend up at 2.2.  Chronic thrombocytopenia:  Follow-up with hematology as an outpatient.    history of breast cancer Follow-up with hematology as an outpatient.  Remote history of colon cancer:  noted.  HIV/AIDS: Continue current medication no changes made. To follow-up with ID as an outpatient. Discharge Instructions  Discharge Instructions     Diet - low sodium heart healthy   Complete by: As  directed    Increase activity slowly   Complete by: As directed       Allergies as of 04/22/2023   No Known Allergies      Medication List     STOP taking these medications    acetaminophen 325 MG tablet Commonly known as: TYLENOL   bisacodyl 5 MG EC tablet Generic drug: bisacodyl   ondansetron 4 MG tablet Commonly known as: ZOFRAN       TAKE these medications    Dovato 50-300 MG tablet Generic drug: dolutegravir-lamiVUDine TAKE 1 TABLET BY MOUTH EVERY DAY   GINKGO BILOBA PLUS PO Take 120 mg by mouth daily.   linezolid 600 MG tablet Commonly known as: ZYVOX Take 1 tablet (600 mg total) by mouth every 12 (twelve) hours for 7 days.   methadone 10 MG/5ML solution Commonly known as: DOLOPHINE Take 23 mg by mouth in the morning.   oxyCODONE 5 MG immediate release tablet Commonly known as: Oxy IR/ROXICODONE Take 1 tablet (5 mg total) by mouth every 6 (six) hours as needed for severe pain (pain score 7-10).   Vitamin D3 50 MCG (2000 UT) capsule Take 2,000 Units by mouth daily.   Vitamin K2 100 MCG Tabs Take 100 mg by mouth daily.        No Known Allergies  Consultations: Infectious disease   Procedures/Studies: VAS [place] LOWER EXTREMITY VENOUS (DVT)  Result Date: 04/20/2023  Lower Venous DVT Study Patient Name:  ANGELAMARIE Stephenson  Date of Exam:   04/20/2023 Medical Rec #: [id number]        Accession #:    [id number] Date of Birth: Apr 23, 1951  1. Circumferential dermal thickening and subcutaneous edema, asymmetrically more severe along the medial aspect of the distal right foreleg at the level of the distal diaphysis of the tibia. No loculated subcutaneous fluid collection identified. 2. 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 3. 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 4. Mild prepatellar subcutaneous edema. Electronically Signed   By: Helyn Numbers M.D.   On: 04/19/2023 23:30   DG Tibia/Fibula Right  Result Date: 04/19/2023 CLINICAL DATA:  Leg pain.  Cellulitis. EXAM: RIGHT TIBIA AND FIBULA - 2 VIEW COMPARISON:  02/20/2023 FINDINGS: There is no evidence of fracture or other focal bone lesions. No erosive change or periostitis. Knee and ankle alignment are maintained with knee degenerative change. Generalized soft tissue edema. No soft tissue gas. Scattered soft tissue phleboliths. No radiopaque foreign body peer IMPRESSION: Generalized soft  tissue edema. No soft tissue gas or radiographic evidence of osteomyelitis. Electronically Signed   By: Narda Rutherford M.D.   On: 04/19/2023 22:19   VAS [place] LOWER EXTREMITY VENOUS (DVT) (7a-7p)  Result Date: 04/14/2023  Lower Venous DVT Study Patient Name:  SHARLIZE LAUS  Date of Exam:   04/13/2023 Medical Rec #: [id number]        Accession #:    [id number] Date of Birth: Oct 13, 1950        Patient Gender: F Patient Age:   28 years Exam Location:  [facility] Procedure:      VAS [place] LOWER EXTREMITY VENOUS (DVT) Referring Phys: ADAM CURATOLO --------------------------------------------------------------------------------  Indications: Pain, Swelling, and Patient presents with redness , swelling and pain in her right calf extending up into her mid thigh area. Other Indications: Breast surgery 2 days ago. Comparison Study: 02/27/23 - Negative Performing Technologist: Marilynne Halsted RDMS, RVT  Examination Guidelines: A complete evaluation includes B-mode imaging, spectral Doppler, color Doppler, and power Doppler as needed of all accessible portions of each vessel. Bilateral testing is considered an integral part of a complete examination. Limited examinations for reoccurring indications may be performed as noted. The reflux portion of the exam is performed with the patient in reverse Trendelenburg.  +---------+---------------+---------+-----------+----------+--------------+ RIGHT    CompressibilityPhasicitySpontaneityPropertiesThrombus Aging +---------+---------------+---------+-----------+----------+--------------+ CFV      Full           Yes      Yes                                 +---------+---------------+---------+-----------+----------+--------------+ SFJ      Full                                                        +---------+---------------+---------+-----------+----------+--------------+ FV Prox  Full                                                         +---------+---------------+---------+-----------+----------+--------------+ FV Mid   Full                                                        +---------+---------------+---------+-----------+----------+--------------+  1. Circumferential dermal thickening and subcutaneous edema, asymmetrically more severe along the medial aspect of the distal right foreleg at the level of the distal diaphysis of the tibia. No loculated subcutaneous fluid collection identified. 2. 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 3. 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 4. Mild prepatellar subcutaneous edema. Electronically Signed   By: Helyn Numbers M.D.   On: 04/19/2023 23:30   DG Tibia/Fibula Right  Result Date: 04/19/2023 CLINICAL DATA:  Leg pain.  Cellulitis. EXAM: RIGHT TIBIA AND FIBULA - 2 VIEW COMPARISON:  02/20/2023 FINDINGS: There is no evidence of fracture or other focal bone lesions. No erosive change or periostitis. Knee and ankle alignment are maintained with knee degenerative change. Generalized soft tissue edema. No soft tissue gas. Scattered soft tissue phleboliths. No radiopaque foreign body peer IMPRESSION: Generalized soft  tissue edema. No soft tissue gas or radiographic evidence of osteomyelitis. Electronically Signed   By: Narda Rutherford M.D.   On: 04/19/2023 22:19   VAS [place] LOWER EXTREMITY VENOUS (DVT) (7a-7p)  Result Date: 04/14/2023  Lower Venous DVT Study Patient Name:  SHARLIZE LAUS  Date of Exam:   04/13/2023 Medical Rec #: [id number]        Accession #:    [id number] Date of Birth: Oct 13, 1950        Patient Gender: F Patient Age:   28 years Exam Location:  [facility] Procedure:      VAS [place] LOWER EXTREMITY VENOUS (DVT) Referring Phys: ADAM CURATOLO --------------------------------------------------------------------------------  Indications: Pain, Swelling, and Patient presents with redness , swelling and pain in her right calf extending up into her mid thigh area. Other Indications: Breast surgery 2 days ago. Comparison Study: 02/27/23 - Negative Performing Technologist: Marilynne Halsted RDMS, RVT  Examination Guidelines: A complete evaluation includes B-mode imaging, spectral Doppler, color Doppler, and power Doppler as needed of all accessible portions of each vessel. Bilateral testing is considered an integral part of a complete examination. Limited examinations for reoccurring indications may be performed as noted. The reflux portion of the exam is performed with the patient in reverse Trendelenburg.  +---------+---------------+---------+-----------+----------+--------------+ RIGHT    CompressibilityPhasicitySpontaneityPropertiesThrombus Aging +---------+---------------+---------+-----------+----------+--------------+ CFV      Full           Yes      Yes                                 +---------+---------------+---------+-----------+----------+--------------+ SFJ      Full                                                        +---------+---------------+---------+-----------+----------+--------------+ FV Prox  Full                                                         +---------+---------------+---------+-----------+----------+--------------+ FV Mid   Full                                                        +---------+---------------+---------+-----------+----------+--------------+  Physician Discharge Summary  Ruth Stephenson:[id number] DOB: 11/02/1950 DOA: 04/19/2023  PCP: [name], MD  Admit date: 04/19/2023 Discharge date: 04/22/2023  Admitted From: Home Disposition:  Home  Recommendations for Outpatient Follow-up:  Follow up with PCP in 1-2 weeks Please obtain BMP/CBC in one week   Home Health:No Equipment/Devices:None  Discharge Condition:Stable CODE STATUS:Ful  Diet recommendation: Heart Healthy   Brief/Interim Summary:  72 y.o. 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    Discharge Diagnoses:  Principal Problem:   Cellulitis of leg, right  Right lower extremity cellulitis: Imaging showed no osteomyelitis or fluid collection. ID was consulted recommended to switch her to a linezolid which she will continue as an outpatient Erythema receded and her swelling decreased.  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 On the day of discharge her white blood cell started to trend up at 2.2.  Chronic thrombocytopenia:  Follow-up with hematology as an outpatient.    history of breast cancer Follow-up with hematology as an outpatient.  Remote history of colon cancer:  noted.  HIV/AIDS: Continue current medication no changes made. To follow-up with ID as an outpatient. Discharge Instructions  Discharge Instructions     Diet - low sodium heart healthy   Complete by: As  directed    Increase activity slowly   Complete by: As directed       Allergies as of 04/22/2023   No Known Allergies      Medication List     STOP taking these medications    acetaminophen 325 MG tablet Commonly known as: TYLENOL   bisacodyl 5 MG EC tablet Generic drug: bisacodyl   ondansetron 4 MG tablet Commonly known as: ZOFRAN       TAKE these medications    Dovato 50-300 MG tablet Generic drug: dolutegravir-lamiVUDine TAKE 1 TABLET BY MOUTH EVERY DAY   GINKGO BILOBA PLUS PO Take 120 mg by mouth daily.   linezolid 600 MG tablet Commonly known as: ZYVOX Take 1 tablet (600 mg total) by mouth every 12 (twelve) hours for 7 days.   methadone 10 MG/5ML solution Commonly known as: DOLOPHINE Take 23 mg by mouth in the morning.   oxyCODONE 5 MG immediate release tablet Commonly known as: Oxy IR/ROXICODONE Take 1 tablet (5 mg total) by mouth every 6 (six) hours as needed for severe pain (pain score 7-10).   Vitamin D3 50 MCG (2000 UT) capsule Take 2,000 Units by mouth daily.   Vitamin K2 100 MCG Tabs Take 100 mg by mouth daily.        No Known Allergies  Consultations: Infectious disease   Procedures/Studies: VAS [place] LOWER EXTREMITY VENOUS (DVT)  Result Date: 04/20/2023  Lower Venous DVT Study Patient Name:  ANGELAMARIE Stephenson  Date of Exam:   04/20/2023 Medical Rec #: [id number]        Accession #:    [id number] Date of Birth: Apr 23, 1951  1. Circumferential dermal thickening and subcutaneous edema, asymmetrically more severe along the medial aspect of the distal right foreleg at the level of the distal diaphysis of the tibia. No loculated subcutaneous fluid collection identified. 2. 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 3. 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 4. Mild prepatellar subcutaneous edema. Electronically Signed   By: Helyn Numbers M.D.   On: 04/19/2023 23:30   DG Tibia/Fibula Right  Result Date: 04/19/2023 CLINICAL DATA:  Leg pain.  Cellulitis. EXAM: RIGHT TIBIA AND FIBULA - 2 VIEW COMPARISON:  02/20/2023 FINDINGS: There is no evidence of fracture or other focal bone lesions. No erosive change or periostitis. Knee and ankle alignment are maintained with knee degenerative change. Generalized soft tissue edema. No soft tissue gas. Scattered soft tissue phleboliths. No radiopaque foreign body peer IMPRESSION: Generalized soft  tissue edema. No soft tissue gas or radiographic evidence of osteomyelitis. Electronically Signed   By: Narda Rutherford M.D.   On: 04/19/2023 22:19   VAS [place] LOWER EXTREMITY VENOUS (DVT) (7a-7p)  Result Date: 04/14/2023  Lower Venous DVT Study Patient Name:  SHARLIZE LAUS  Date of Exam:   04/13/2023 Medical Rec #: [id number]        Accession #:    [id number] Date of Birth: Oct 13, 1950        Patient Gender: F Patient Age:   28 years Exam Location:  [facility] Procedure:      VAS [place] LOWER EXTREMITY VENOUS (DVT) Referring Phys: ADAM CURATOLO --------------------------------------------------------------------------------  Indications: Pain, Swelling, and Patient presents with redness , swelling and pain in her right calf extending up into her mid thigh area. Other Indications: Breast surgery 2 days ago. Comparison Study: 02/27/23 - Negative Performing Technologist: Marilynne Halsted RDMS, RVT  Examination Guidelines: A complete evaluation includes B-mode imaging, spectral Doppler, color Doppler, and power Doppler as needed of all accessible portions of each vessel. Bilateral testing is considered an integral part of a complete examination. Limited examinations for reoccurring indications may be performed as noted. The reflux portion of the exam is performed with the patient in reverse Trendelenburg.  +---------+---------------+---------+-----------+----------+--------------+ RIGHT    CompressibilityPhasicitySpontaneityPropertiesThrombus Aging +---------+---------------+---------+-----------+----------+--------------+ CFV      Full           Yes      Yes                                 +---------+---------------+---------+-----------+----------+--------------+ SFJ      Full                                                        +---------+---------------+---------+-----------+----------+--------------+ FV Prox  Full                                                         +---------+---------------+---------+-----------+----------+--------------+ FV Mid   Full                                                        +---------+---------------+---------+-----------+----------+--------------+  Physician Discharge Summary  Ruth Stephenson:[id number] DOB: 11/02/1950 DOA: 04/19/2023  PCP: [name], MD  Admit date: 04/19/2023 Discharge date: 04/22/2023  Admitted From: Home Disposition:  Home  Recommendations for Outpatient Follow-up:  Follow up with PCP in 1-2 weeks Please obtain BMP/CBC in one week   Home Health:No Equipment/Devices:None  Discharge Condition:Stable CODE STATUS:Ful  Diet recommendation: Heart Healthy   Brief/Interim Summary:  72 y.o. 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    Discharge Diagnoses:  Principal Problem:   Cellulitis of leg, right  Right lower extremity cellulitis: Imaging showed no osteomyelitis or fluid collection. ID was consulted recommended to switch her to a linezolid which she will continue as an outpatient Erythema receded and her swelling decreased.  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 On the day of discharge her white blood cell started to trend up at 2.2.  Chronic thrombocytopenia:  Follow-up with hematology as an outpatient.    history of breast cancer Follow-up with hematology as an outpatient.  Remote history of colon cancer:  noted.  HIV/AIDS: Continue current medication no changes made. To follow-up with ID as an outpatient. Discharge Instructions  Discharge Instructions     Diet - low sodium heart healthy   Complete by: As  directed    Increase activity slowly   Complete by: As directed       Allergies as of 04/22/2023   No Known Allergies      Medication List     STOP taking these medications    acetaminophen 325 MG tablet Commonly known as: TYLENOL   bisacodyl 5 MG EC tablet Generic drug: bisacodyl   ondansetron 4 MG tablet Commonly known as: ZOFRAN       TAKE these medications    Dovato 50-300 MG tablet Generic drug: dolutegravir-lamiVUDine TAKE 1 TABLET BY MOUTH EVERY DAY   GINKGO BILOBA PLUS PO Take 120 mg by mouth daily.   linezolid 600 MG tablet Commonly known as: ZYVOX Take 1 tablet (600 mg total) by mouth every 12 (twelve) hours for 7 days.   methadone 10 MG/5ML solution Commonly known as: DOLOPHINE Take 23 mg by mouth in the morning.   oxyCODONE 5 MG immediate release tablet Commonly known as: Oxy IR/ROXICODONE Take 1 tablet (5 mg total) by mouth every 6 (six) hours as needed for severe pain (pain score 7-10).   Vitamin D3 50 MCG (2000 UT) capsule Take 2,000 Units by mouth daily.   Vitamin K2 100 MCG Tabs Take 100 mg by mouth daily.        No Known Allergies  Consultations: Infectious disease   Procedures/Studies: VAS [place] LOWER EXTREMITY VENOUS (DVT)  Result Date: 04/20/2023  Lower Venous DVT Study Patient Name:  ANGELAMARIE Stephenson  Date of Exam:   04/20/2023 Medical Rec #: [id number]        Accession #:    [id number] Date of Birth: Apr 23, 1951

## 2023-04-22 NOTE — Progress Notes (Signed)
Pt was discharged home today. Instructions were reviewed with patient, and questions were answered. Pt was taken to main entrance via wheelchair by NT.  

## 2023-04-23 DIAGNOSIS — F1120 Opioid dependence, uncomplicated: Secondary | ICD-10-CM | POA: Diagnosis not present

## 2023-04-24 ENCOUNTER — Other Ambulatory Visit: Payer: Self-pay | Admitting: *Deleted

## 2023-04-24 ENCOUNTER — Telehealth: Payer: Self-pay

## 2023-04-24 DIAGNOSIS — C50412 Malignant neoplasm of upper-outer quadrant of left female breast: Secondary | ICD-10-CM | POA: Diagnosis not present

## 2023-04-24 DIAGNOSIS — Z170 Estrogen receptor positive status [ER+]: Secondary | ICD-10-CM | POA: Diagnosis not present

## 2023-04-24 DIAGNOSIS — R600 Localized edema: Secondary | ICD-10-CM

## 2023-04-24 NOTE — Transitions of Care (Post Inpatient/ED Visit) (Signed)
04/24/2023  Name: Ruth Stephenson MRN: [id number] DOB: 11/27/1950  Today's TOC FU Call Status: Today's TOC FU Call Status:: Unsuccessful Call (1st Attempt) Unsuccessful Call (1st Attempt) Date: 04/24/23  Attempted to reach the patient regarding the most recent Inpatient visit.  Follow Up Plan: Additional outreach attempts will be made to reach the patient to complete the Transitions of Care (Post Inpatient/ED visit) call.

## 2023-04-25 ENCOUNTER — Encounter: Payer: Self-pay | Admitting: *Deleted

## 2023-04-25 ENCOUNTER — Telehealth: Payer: Self-pay | Admitting: *Deleted

## 2023-04-25 ENCOUNTER — Encounter (HOSPITAL_COMMUNITY): Payer: Self-pay

## 2023-04-25 DIAGNOSIS — C50412 Malignant neoplasm of upper-outer quadrant of left female breast: Secondary | ICD-10-CM

## 2023-04-25 LAB — CULTURE, BLOOD (ROUTINE X 2)
Culture: NO GROWTH
Culture: NO GROWTH
Special Requests: ADEQUATE

## 2023-04-25 NOTE — Telephone Encounter (Signed)
Received oncotype results of 14/4%. Referral placed for Dr. Mitzi Hansen

## 2023-04-26 ENCOUNTER — Telehealth: Payer: Self-pay | Admitting: Radiation Oncology

## 2023-04-26 ENCOUNTER — Telehealth: Payer: Self-pay

## 2023-04-26 NOTE — Transitions of Care (Post Inpatient/ED Visit) (Signed)
04/26/2023  Name: Ruth Stephenson MRN: [id number] DOB: 03/20/1951  Today's TOC FU Call Status: Today's TOC FU Call Status:: Unsuccessful Call (2nd Attempt) Unsuccessful Call (2nd Attempt) Date: 04/25/23  Attempted to reach the patient regarding the most recent Inpatient visit.  Follow Up Plan: Additional outreach attempts will be made to reach the patient to complete the Transitions of Care (Post Inpatient/ED visit) call.

## 2023-04-26 NOTE — Telephone Encounter (Signed)
Attempted to contact patient to schedule consult per 10/30 referral. Main number answered and stated it was the wrong number. The home phone number stated, "call cannot be completed as dialed."

## 2023-04-27 ENCOUNTER — Telehealth: Payer: Self-pay

## 2023-04-27 NOTE — Transitions of Care (Post Inpatient/ED Visit) (Signed)
   04/27/2023  Name: Ruth Stephenson MRN: [id number] DOB: [birth year]  Today's TOC FU Call Status: Today's TOC FU Call Status:: Unsuccessful Call (3rd Attempt) Unsuccessful Call (3rd Attempt) Date: 04/27/23  Attempted to reach the patient regarding the most recent Inpatient/ED visit.  Follow Up Plan: No further outreach attempts will be made at this time. We have been unable to contact the patient.

## 2023-04-30 ENCOUNTER — Telehealth: Payer: Self-pay

## 2023-04-30 NOTE — Transitions of Care (Post Inpatient/ED Visit) (Signed)
04/30/2023  Name: Ruth Stephenson MRN: [id number] DOB: September 06, 1950  Today's TOC FU Call Status: Successful Transition of Care Follow Up completed.  Patient declines 30 day program enrollment at this time.    Patient's Name and Date of Birth confirmed.  Transition Care Management Follow-up Telephone Call Date of Discharge: 04/22/23 Discharge Facility: [facility]) Type of Discharge: Inpatient Admission Primary Inpatient Discharge Diagnosis:: Cellulitis of right leg How have you been since you were released from the hospital?: Better ("My leg is getting better by the day, I have had ongoing problems for some time.  I am very upset today because I just received a call from my oncologist stating my breast cancer has returned after they told me a few weeks ago that it was gone") Any questions or concerns?: Yes Patient Questions/Concerns:: Patient expressed concern and anxiety after finding out today that her breast cancer has returned and doesn't understand this after being told few ago that she was in remission.  Reports she has numerous appointments over the next week or two and requested I look at her sched. and "explain who she is seeing and when. Patient Questions/Concerns Addressed: Other: [facility] RN reviewed her future appointments and explained who she was scheduled to see and when.  Provided emotional support and encouragement.)  Items Reviewed: Did you receive and understand the discharge instructions provided?: Yes Medications obtained,verified, and reconciled?: Yes (Medications Reviewed) (Verbalized understanding of changes to her medications per hospital discharge instructions.) Any new allergies since your discharge?: No Dietary orders reviewed?: Yes Type of Diet Ordered:: low sodium heart healthy Do you have support at home?: Yes People in Home: significant other, sibling(s) Name of Support/Comfort Primary Source: Designated Party Release - Gibson,Cynthia  Medications Reviewed  Today: Medications Reviewed Today     Reviewed by Wyline Mood, RN (Case Manager) on 04/30/23 at 0253  Med List Status: <None>   Medication Order Taking? Sig Documenting Provider Last Dose Status Informant  Cholecalciferol (VITAMIN D3) 50 MCG (2000 UT) capsule [id number] Yes Take 2,000 Units by mouth daily. [provider] Taking Active Self  DOVATO 50-300 MG tablet [id number] Yes TAKE 1 TABLET BY MOUTH EVERY DAY [name], MD Taking Active Self  GINKGO BILOBA PLUS PO [id number] Yes Take 120 mg by mouth daily. [provider] Taking Active Self  linezolid (ZYVOX) 600 MG tablet [id number] Yes Take 600 mg by mouth 2 (two) times daily. [provider] Taking Active   Menatetrenone (VITAMIN K2) 100 MCG TABS [id number] Yes Take 100 mg by mouth daily. [provider] Taking Active Self  methadone (DOLOPHINE) 10 MG/5ML solution [id number] Yes Take 23 mg by mouth in the morning. [provider] Taking Active Self           Med Note Rich Number Apr 04, 2023  9:41 AM) [facility], Per [facility] LPN late dose 26/7/12, Take home 13 doses. Confirmed the dose is 23 mg  oxyCODONE (OXY IR/ROXICODONE) 5 MG immediate release tablet [id number]  Take 1 tablet (5 mg total) by mouth every 6 (six) hours as needed for severe pain (pain score 7-10). Almond Lint, MD  Active Self  Med List Note Dianah Field 04/04/23 8250): [facility] [place], [place] [phone]            Home Care and Equipment/Supplies: Were Home Health Services Ordered?: No Any new equipment or medical supplies ordered?: No  Functional Questionnaire: Do you need assistance with bathing/showering or dressing?:  No Do you need assistance with meal preparation?: No Do you need assistance with eating?: No Do you have difficulty maintaining continence: No Do you need assistance with getting out of bed/getting out of a chair/moving?: No Do  you have difficulty managing or taking your medications?: No  Follow up appointments reviewed: PCP Follow-up appointment confirmed?: Yes Date of PCP follow-up appointment?: 05/01/23 Follow-up Provider: [name], MD Specialist Hospital Follow-up appointment confirmed?: Yes (11/5 Dr. Ninetta Lights (ID/IM), 11/12 Dr. Al Pimple (Onc.), 11/6 Venous Doppler Studies) Date of Specialist follow-up appointment?: 05/08/23 Follow-Up Specialty Provider:: Burnice Logan Iruku Do you need transportation to your follow-up appointment?: No Do you understand care options if your condition(s) worsen?: Yes-patient verbalized understanding  SDOH Interventions Today    Flowsheet Row Most Recent Value  SDOH Interventions   Transportation Interventions Intervention Not Indicated  Stress Interventions Intervention Not Indicated, Other (Comment)  [Emotional support and encouragement provided this Clinical research associate.  Patient has strong support at her church - friends and family]     Dariah Mcsorley Daphine Deutscher BSN, Programmer, systems   Transitions of Care VBCI - [facility] Direct Dial Number:  [phone]

## 2023-05-01 ENCOUNTER — Telehealth: Payer: Self-pay | Admitting: Infectious Diseases

## 2023-05-01 ENCOUNTER — Encounter: Payer: Self-pay | Admitting: Infectious Diseases

## 2023-05-01 ENCOUNTER — Ambulatory Visit: Payer: Medicare HMO | Admitting: Infectious Diseases

## 2023-05-01 VITALS — BP 107/47 | HR 75 | Temp 98.5°F | Ht 63.0 in | Wt 207.1 lb

## 2023-05-01 DIAGNOSIS — C50412 Malignant neoplasm of upper-outer quadrant of left female breast: Secondary | ICD-10-CM | POA: Diagnosis not present

## 2023-05-01 DIAGNOSIS — L03115 Cellulitis of right lower limb: Secondary | ICD-10-CM

## 2023-05-01 DIAGNOSIS — B20 Human immunodeficiency virus [HIV] disease: Secondary | ICD-10-CM | POA: Diagnosis not present

## 2023-05-01 DIAGNOSIS — Z171 Estrogen receptor negative status [ER-]: Secondary | ICD-10-CM | POA: Diagnosis not present

## 2023-05-01 NOTE — Assessment & Plan Note (Signed)
Happy birthday! Will have her eval for cabaneuva Is doing well with dovato.  She refuses flu and covid vax.  Will see her back in 1 month.

## 2023-05-01 NOTE — Progress Notes (Signed)
Subjective:    Patient ID: Ruth Stephenson, female  DOB: 08/07/1950, 72 y.o.        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 (Genotype 2014 naive)--> dovato (11-2021).  She also has a hx of Hep C treated (Harvoni) 2015. 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  In 08-2012 she had surgery for colon cancer (II).  She had CT abd 04-2017: Cirrhotic morphology of the liver. Hyperdense focus within the anterior left hepatic lobe ; when clinically feasible, evaluation with liver MRI is suggested, preferably as an outpatient when the patient is able to breath hold.   She was in hospital 03-14-20 to 03-18-20 for COVID.  She was in hospital 02-2021 with Pneumococcal pna and non-adherence to meds.    She was seen in UC by Dr Darrow Bussing [phone] for CP and L 3rd toe fracture. She was started on pepcid (she did not fill, no further CP). She was referred to CV- has been resched.    She has f/u at Methadone clinic. Down to Methadone 23mg . Does not have cravings, also has help at her church.    Her Hep C RNA was (-) (11-2021).    Got section 8 housing. On disability.    She was seen in urgent care for pre-syncope. Her CV w/u was initially (-) and she deferred admission.    She had abn mamogram 02-2023.  She underwent lumpectomy 03-2023:  BREAST, LEFT, LUMPECTOMY:  - Invasive ductal carcinoma, 1.8 cm, grade 2  - Ductal carcinoma in situ: Present, intermediate grade, solid and  cribriform type  ER+PR+ Her2- She has had oncology f/u and has a navigator. She got a call last week. Has not gotten XRT or CTX (yet).   Adm with cellulitis R leg 04-13-23. 02-26-23 and 02-20-23 as well.  She had multiple open wounds on her legs. She has one left that has clear liquid d/c from the wound.  Has noted R inguinal LAN from this.  Has vascular f/u 05-02-23.  No oral ulcers. Has felt feverish.  After d/c from hospital she was given po anbx. She completed this  (zyvox) yesterday.   HIV 1 RNA Quant  Date Value  12/16/2021 Not Detected Copies/mL  11/25/2021 33 Copies/mL (H)  09/16/2021 105 copies/mL (H)   HIV-1 RNA Viral Load (copies/mL)  Date Value  10/03/2022 <20   CD4 T Cell Abs (/uL)  Date Value  04/19/2023 215 (L)  10/03/2022 151 (L)  05/07/2022 143 (L)     Health Maintenance  Topic Date Due   Medicare Annual Wellness (AWV)  Never done   COVID-19 Vaccine (1) Never done   Zoster Vaccines- Shingrix (1 of 2) Never done   DEXA SCAN  Never done   Colonoscopy  09/21/2022   INFLUENZA VACCINE  01/25/2023   MAMMOGRAM  03/14/2025   DTaP/Tdap/Td (2 - Td or Tdap) 02/02/2032   Pneumonia Vaccine 35+ Years old  Completed   Hepatitis C Screening  Completed   HPV VACCINES  Aged Out      Review of Systems  Constitutional:  Negative for chills, fever and weight loss.  Gastrointestinal:  Positive for diarrhea. Negative for constipation.    Please see HPI. All other systems reviewed and negative.     Objective:  Physical Exam Vitals reviewed.  Constitutional:      Appearance: Normal appearance.  HENT:     Mouth/Throat:  Mouth: Mucous membranes are moist.     Pharynx: No oropharyngeal exudate.  Eyes:     Extraocular Movements: Extraocular movements intact.     Pupils: Pupils are equal, round, and reactive to light.  Cardiovascular:     Rate and Rhythm: Normal rate and regular rhythm.  Pulmonary:     Effort: Pulmonary effort is normal.     Breath sounds: Normal breath sounds.  Abdominal:     General: Bowel sounds are normal. There is no distension.     Palpations: Abdomen is soft.     Tenderness: There is no abdominal tenderness.  Musculoskeletal:     Cervical back: Normal range of motion and neck supple.     Right lower leg: No edema.     Left lower leg: No edema.  Neurological:     General: No focal deficit present.     Mental Status: She is alert and oriented to person, place, and time.  Psychiatric:        Mood  and Affect: Mood normal.   Her RLE is edematous, non-pitting. No increase in heat. Non-tender.  She has a small os, no draining.         Assessment & Plan:

## 2023-05-01 NOTE — Assessment & Plan Note (Signed)
She has completed her zyvox  I don't think she needs more anbx at this point.  She has vasc f/u this week.

## 2023-05-01 NOTE — Telephone Encounter (Signed)
Pt needs to be eval if she qualifies for cabanueva,  Thanks!

## 2023-05-01 NOTE — Assessment & Plan Note (Signed)
Appreciate her onc f/u.

## 2023-05-02 ENCOUNTER — Telehealth: Payer: Self-pay | Admitting: Radiation Oncology

## 2023-05-02 ENCOUNTER — Ambulatory Visit (HOSPITAL_COMMUNITY): Payer: Medicare HMO

## 2023-05-02 ENCOUNTER — Encounter: Payer: Self-pay | Admitting: *Deleted

## 2023-05-02 NOTE — Telephone Encounter (Signed)
I called and left a message with the patient to clarify some of her concerns about why our office had called to make an appointment to follow up, and I encouraged her to call us back.

## 2023-05-03 ENCOUNTER — Other Ambulatory Visit: Payer: Self-pay

## 2023-05-03 DIAGNOSIS — F1120 Opioid dependence, uncomplicated: Secondary | ICD-10-CM | POA: Diagnosis not present

## 2023-05-03 NOTE — Telephone Encounter (Signed)
I called again and this time was able to reach the patient. She verbalized frustration in how she received the reassuring pathology results, and felt like she was told she didn't need any additional treatment for her cancer. She was hopeful since this was similar to how colon polyps were dealt with as well previously.   We discussed the history of radiation and the rationale for this treatment. We also discussed that based on her results, if she elected to take antiestrogen therapy, she would meet criteria to forgo radiation. After discussing this, she is not sure how she'd like to proceed, since she also did not think she would need to take medication. She is aware that if she does not take antiestrogen therapy, it would be recommended to complete radiation. We discussed hypofractionated versus ultrahypofractionated therapy including the logistics and treatment side effects.   I apologized for the misunderstanding leading up to our call, and explained that we could give her time to see Dr. Donell Beers tomorrow, to consider our call, and if she'd like to meet with Dr. Al Pimple as well before making a decision we would be happy to revisit her decision. She is open to another call from our scheduling department to coordinate a follow up appointment to finalize her radiation related treatment decisions.

## 2023-05-03 NOTE — Transitions of Care (Post Inpatient/ED Visit) (Signed)
   05/03/2023  Name: Ruth Stephenson MRN: [id number] DOB: [birth year]  Today's TOC FU Call Status: Today's TOC FU Call Status:: Successful TOC FU Call Completed TOC FU Call Complete Date: 05/03/23 Patient's Name and Date of Birth confirmed.  Transition Care Management Follow-up Telephone Call Date of Discharge: 04/22/23 Discharge [facility]) Type of Discharge: Inpatient Admission Primary Inpatient Discharge Diagnosis:: Cellulitis of Right Leg, Recent DX of Breast Cancer, s/p Lumpectomy Any questions or concerns?: Yes (Patient continues express concerns with multiple medical appointments and not quite sure "who is who".  Reviewed future appointments as per Epic and the type of specialty listed for these appointments.  Patient verb. better understanding at eoc.) Patient Questions/Concerns Addressed: Other: (Reviewed future appointments as per Epic.  Patient verb. better understanding at end of call.  Enc. patient to follow-up with her appts and to consider taking a support person with her to be her second set of ears and take notes if she desired.)  Items Reviewed: Reviewed upcoming appointments scheduled per Epic review and today's outreach and subsequent message left by Radiation Oncology.   Medications Reviewed Today: Medications Reviewed Today   Medications were not reviewed in this encounter.  Patient declined.   Home Care and Equipment/Supplies: Not reviewed - Patient declined   Functional Questionnaire: Not reviewed - Patient declined   Follow up appointments reviewed: Reviewed upcoming appointments scheduled per Epic review and today's outreach and subsequent message left by Radiation Oncology.  Patient declined enrollment in TOC 30 day program; accepted [facility] RNCM direct dial call back number should she decide to enroll in the future.  Hodges Treiber Daphine Deutscher BSN, RN RN Care Manager   Transitions of Care VBCI - [facility] Direct Dial Number:  [phone]

## 2023-05-04 ENCOUNTER — Ambulatory Visit (HOSPITAL_COMMUNITY)
Admission: RE | Admit: 2023-05-04 | Discharge: 2023-05-04 | Disposition: A | Discharge status: Home / Self Care | Payer: Medicare HMO | Source: Ambulatory Visit | Attending: Physician Assistant | Admitting: Physician Assistant

## 2023-05-04 DIAGNOSIS — R600 Localized edema: Secondary | ICD-10-CM | POA: Diagnosis not present

## 2023-05-07 ENCOUNTER — Other Ambulatory Visit (HOSPITAL_COMMUNITY): Payer: Self-pay

## 2023-05-07 DIAGNOSIS — F1120 Opioid dependence, uncomplicated: Secondary | ICD-10-CM | POA: Diagnosis not present

## 2023-05-07 NOTE — Progress Notes (Incomplete)
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   HISTORY OF PRESENT ILLNESS: Ruth Stephenson is a 72 y.o. female  seen in the multidisciplinary breast clinic for a new diagnosis of left breast cancer. The patient was noted to have a palpable mass in the left breast and diagnostic mammogram on 03/15/2023 showed a spiculated mass in the upper outer left breast.  No abnormalities were noted in the right breast.  By ultrasound the mass was in the 2 o'clock position of the left breast measuring 1.5 cm in greatest dimension with a deep left axillary lymph node bordering on concern with cortical thickening of 3.3 mm no other abnormalities were appreciated.  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  The cancer was ER/PR positive, HER2 was negative with a Ki-67 of 5%.  Of note at the time of her biopsy there was no definitive morphologically abnormal lymph nodes so a biopsy was not performed in this area.   Since her last visit the patient underwent a left lumpectomy on 04/11/2023 that showed a grade 2 invasive ductal carcinoma measuring 1.8 cm, there was associated intermediate grade DCIS and margins were negative for carcinoma, margins were also negative.  This understanding that the patient had about needing back to discuss radiation but I spoke with her on the phone last week to outline that she had the ability to consider foregoing radiation provided that she take antiestrogen therapy.  She wanted some additional time to consider this, we  stopped both about hypofractionated radiation versus ultra hypofractionated courses and she is seen today to discuss her decisions.    PREVIOUS RADIATION THERAPY: No   PAST MEDICAL HISTORY:  Past Medical History:  Diagnosis Date   Angio-edema    Breast cancer (HCC)    Cancer (HCC)    Cancer of right colon (HCC) 10/24/2012   Discussed with h/o- no need for further f/u (08-2020)     Cirrhosis of liver (HCC)    had a liver u/s in 2014 that showed that she was cirrhotic.   Hepatitis C    treated ([place]) 2015   History of COVID-19 06/03/2020   HIV (human immunodeficiency virus infection) (HCC)    Pneumonia 03/15/2021   in hospital 02-2021 with Pneumococcal pna and non-adherence to meds.   Polysubstance abuse (HCC)    Tuberculosis    neg chest xray       PAST SURGICAL HISTORY: Past Surgical History:  Procedure Laterality Date   BREAST BIOPSY Left 03/22/2023   [place] LT BREAST BX W LOC DEV 1ST LESION IMG BX SPEC US GUIDE 03/22/2023 [facility] MAMMOGRAPHY   BREAST BIOPSY Left 04/10/2023   [place] LT RADIOACTIVE SEED LOC 04/10/2023 [facility] ULTRASOUND   BREAST LUMPECTOMY WITH RADIOACTIVE SEED LOCALIZATION Left 04/11/2023   Procedure: LEFT BREAST SEED LOCALIZED LUMPECTOMY;  Surgeon: Almond Lint, MD;  Location: [facility];  Service: General;  Laterality: Left;   CARPAL TUNNEL RELEASE Left 01/19/2014   Procedure: LEFT CARPAL TUNNEL RELEASE;  Surgeon: Marlowe Shores, MD;  Location:  [facility];  Service: Orthopedics;  Laterality: Left;   COLON RESECTION N/A 09/23/2012   Procedure: LAPAROSCOPIC RIGHT COLON RESECTION   ;  Surgeon: Mariella Saa, MD;  Location: [facility];  Service: General;  Laterality: N/A;  LAPOROSCOPY, RIGHT HEMICOLECTOMY   COLON SURGERY  09/23/2012   lap right colectomy   COLONOSCOPY N/A 09/20/2012   Procedure: COLONOSCOPY;  Surgeon: Theda Belfast, MD;  Location: [facility];  Service: Endoscopy;  Laterality: N/A;   OPEN REDUCTION INTERNAL FIXATION (ORIF) DISTAL  RADIAL FRACTURE Left 01/19/2014   Procedure: OPEN REDUCTION INTERNAL FIXATION (ORIF) LEFT  DISTAL RADIAL FRACTURE;  Surgeon: Marlowe Shores, MD;  Location: [facility];  Service: Orthopedics;  Laterality: Left;     FAMILY HISTORY:  Family History  Problem Relation Age of Onset   Angioedema Mother    Cancer Mother 39 - 11       unknown type   Asthma Brother    Throat cancer Maternal Uncle    Angioedema Maternal Grandmother      SOCIAL HISTORY:  reports that she has quit smoking. Her smoking use included cigarettes. She has a 17.5 pack-year smoking history. She has been exposed to tobacco smoke. She has quit using smokeless tobacco. She reports that she does not currently use alcohol. She reports that she does not currently use drugs.  The patient is single and lives in [place].  She has a large extended family but prefers to keep her health information private. She is active in her church and values her pastor's input and plans to talk with him.    ALLERGIES: Patient has no known allergies.   MEDICATIONS:  Current Outpatient Medications  Medication Sig Dispense Refill   Cholecalciferol (VITAMIN D3) 50 MCG (2000 UT) capsule Take 2,000 Units by mouth daily.     DOVATO 50-300 MG tablet TAKE 1 TABLET BY MOUTH EVERY DAY 30 tablet 11   GINKGO BILOBA PLUS PO Take 120 mg by mouth daily.     linezolid (ZYVOX) 600 MG tablet Take 600 mg by mouth 2 (two) times daily.     Menatetrenone (VITAMIN K2) 100 MCG TABS Take 100 mg by mouth daily.     methadone (DOLOPHINE) 10 MG/5ML solution Take 23 mg by mouth in the morning.     oxyCODONE (OXY IR/ROXICODONE) 5 MG immediate release tablet Take 1 tablet (5 mg total) by mouth every 6 (six) hours as needed for severe pain (pain score 7-10). 15 tablet 0   No current facility-administered medications for this visit.     REVIEW OF SYSTEMS: On review of systems, the patient reports that she is doing ***     PHYSICAL EXAM:  Wt  Readings from Last 3 Encounters:  05/01/23 207 lb 1.6 oz (93.9 kg)  04/19/23 200 lb (90.7 kg)  04/13/23 200 lb (90.7 kg)   Temp Readings from Last 3 Encounters:  05/01/23 98.5 F (36.9 C) (Oral)  04/22/23 98.4 F (36.9 C) (Oral)  04/13/23 98.4 F (36.9 C)   BP Readings from Last 3 Encounters:  05/01/23 (!) 107/47  04/22/23 (!) 140/75  04/13/23 130/72   Pulse Readings from Last 3 Encounters:  05/01/23 75  04/22/23 61  04/13/23 74    In general this is a well appearing African American female in no acute distress. She's alert and oriented x4 and appropriate throughout the examination. Cardiopulmonary assessment is negative for acute distress and she exhibits normal effort.  The left breast reveals well-healed  surgical incision site without erythema separation or drainage.    ECOG = 1  0 - Asymptomatic (Fully active, able to carry on all predisease activities without restriction)  1 - Symptomatic but completely ambulatory (Restricted in physically strenuous activity but ambulatory and able to carry out work of a light or sedentary nature. For example, light housework, office work)  2 - Symptomatic, <50% in bed during the day (Ambulatory and capable of all self care but unable to carry out any work activities. Up and about more than 50% of waking hours)  3 - Symptomatic, >50% in bed, but not bedbound (Capable of only limited self-care, confined to bed or chair 50% or more of waking hours)  4 - Bedbound (Completely disabled. Cannot carry on any self-care. Totally confined to bed or chair)  5 - Death   Santiago Glad MM, Creech RH, Tormey DC, et al. [phone]). "Toxicity and response criteria of the [facility]". Am. Evlyn Clines. Oncol. 5 (6): 649-55    LABORATORY DATA:  Lab Results  Component Value Date   WBC 2.2 (L) 04/22/2023   HGB 13.9 04/22/2023   HCT 42.9 04/22/2023   MCV 104.1 (H) 04/22/2023   PLT 131 (L) 04/22/2023   Lab Results  Component Value Date    NA 141 04/20/2023   K 5.0 04/20/2023   CL 106 04/20/2023   CO2 24 04/20/2023   Lab Results  Component Value Date   ALT 17 04/20/2023   AST 32 04/20/2023   ALKPHOS 70 04/20/2023   BILITOT 1.6 (H) 04/20/2023      RADIOGRAPHY: VAS [place] LOWER EXTREMITY VENOUS REFLUX  Result Date: 05/04/2023  Lower Venous Reflux Study Patient Name:  SHELTON FURNISS  Date of Exam:   05/04/2023 Medical Rec #: [id number]        Accession #:    [id number] Date of Birth: 1950/09/07        Patient Gender: F Patient Age:   16 years Exam Location:  [facility] Procedure:      VAS [place] LOWER EXTREMITY VENOUS REFLUX Referring Phys: Emilie Rutter --------------------------------------------------------------------------------  Indications: Edema.  Comparison Study: No prior study Performing Technologist: Gertie Fey MHA, RDMS, RVT, RDCS  Examination Guidelines: A complete evaluation includes B-mode imaging, spectral Doppler, color Doppler, and power Doppler as needed of all accessible portions of each vessel. Bilateral testing is considered an integral part of a complete examination. Limited examinations for reoccurring indications may be performed as noted. The reflux portion of the exam is performed with the patient in reverse Trendelenburg. Significant venous reflux is defined as >500 ms in the superficial venous system, and >1 second in the deep venous system.  Venous Reflux Times +--------------+---------+------+-----------+------------+--------+ RIGHT         Reflux NoRefluxReflux TimeDiameter cmsComments                         Yes                                  +--------------+---------+------+-----------+------------+--------+ CFV                     yes   >1 second                      +--------------+---------+------+-----------+------------+--------+ FV mid        no                                              +--------------+---------+------+-----------+------------+--------+  Popliteal     no                                             +--------------+---------+------+-----------+------------+--------+ GSV at [facility]              yes    >500 ms      0.95             +--------------+---------+------+-----------+------------+--------+ GSV prox thigh          yes    >500 ms      0.57             +--------------+---------+------+-----------+------------+--------+ GSV mid thigh           yes    >500 ms      0.61             +--------------+---------+------+-----------+------------+--------+ GSV dist thigh          yes    >500 ms      0.64             +--------------+---------+------+-----------+------------+--------+ GSV at knee             yes    >500 ms      0.57             +--------------+---------+------+-----------+------------+--------+ GSV prox calf no                            0.32             +--------------+---------+------+-----------+------------+--------+ SSV Pop Fossa no                            0.34             +--------------+---------+------+-----------+------------+--------+ SSV prox calf no                            0.37             +--------------+---------+------+-----------+------------+--------+   Summary: Right: - No evidence of deep vein thrombosis seen in the right lower extremity, from the common femoral through the popliteal veins. - No evidence of superficial venous thrombosis in the right lower extremity.  - The deep venous system is incompetent at the common femoral vein. - The great saphenous vein is incompetent. - The small saphenous vein is competent. *See table(s) above for measurements and observations. Electronically signed by Carolynn Sayers on 05/04/2023 at 2:38:27 PM.    Final    VAS [place] LOWER EXTREMITY VENOUS (DVT)  Result Date: 04/20/2023  Lower Venous DVT Study Patient Name:  REATHEL HOLSMAN  Date of Exam:   04/20/2023  Medical Rec #: [id number]        Accession #:    [id number] Date of Birth: 10/18/1950        Patient Gender: F Patient Age:   44 years Exam Location:  [facility] Procedure:      VAS [place] LOWER EXTREMITY VENOUS (DVT) Referring Phys: Dolly Rias --------------------------------------------------------------------------------  Indications: Swelling.  Risk Factors: None identified. Limitations: Poor ultrasound/tissue interface. Comparison Study: 04/13/2023 - RIGHT:                    - There is no evidence of deep vein  thrombosis in the lower                   extremity.                    - No cystic structure found in the popliteal fossa.                   - Ultrasound characteristics of enlarged lymph nodes are noted                   in the                   groin.                    LEFT:                   - No evidence of common femoral vein obstruction. Performing Technologist: Chanda Busing RVT  Examination Guidelines: A complete evaluation includes B-mode imaging, spectral Doppler, color Doppler, and power Doppler as needed of all accessible portions of each vessel. Bilateral testing is considered an integral part of a complete examination. Limited examinations for reoccurring indications may be performed as noted. The reflux portion of the exam is performed with the patient in reverse Trendelenburg.  +---------+---------------+---------+-----------+----------+-------------------+ RIGHT    CompressibilityPhasicitySpontaneityPropertiesThrombus Aging      +---------+---------------+---------+-----------+----------+-------------------+ CFV      Full           Yes      Yes                                      +---------+---------------+---------+-----------+----------+-------------------+ SFJ      Full                                                             +---------+---------------+---------+-----------+----------+-------------------+ FV Prox  Full                                                              +---------+---------------+---------+-----------+----------+-------------------+ FV Mid   Full                                                             +---------+---------------+---------+-----------+----------+-------------------+ FV DistalFull           Yes      Yes                                      +---------+---------------+---------+-----------+----------+-------------------+ PFV      Full                                                             +---------+---------------+---------+-----------+----------+-------------------+  POP      Full           Yes      Yes                                      +---------+---------------+---------+-----------+----------+-------------------+ PTV      Full                                                             +---------+---------------+---------+-----------+----------+-------------------+ PERO                                                  Not well visualized +---------+---------------+---------+-----------+----------+-------------------+   +----+---------------+---------+-----------+----------+--------------+ LEFTCompressibilityPhasicitySpontaneityPropertiesThrombus Aging +----+---------------+---------+-----------+----------+--------------+ CFV Full           Yes      Yes                                 +----+---------------+---------+-----------+----------+--------------+     Summary: RIGHT: - There is no evidence of deep vein thrombosis in the lower extremity. However, portions of this examination were limited- see technologist comments above.  - No cystic structure found in the popliteal fossa.  LEFT: - No evidence of common femoral vein obstruction.   *See table(s) above for measurements and observations. Electronically signed by Gerarda Fraction on 04/20/2023 at 5:01:31 PM.    Final    CT TIBIA FIBULA RIGHT W CONTRAST  Result Date: 04/19/2023 CLINICAL DATA:   Right lower extremity soft tissue infection EXAM: CT OF THE LOWER RIGHT EXTREMITY WITH CONTRAST TECHNIQUE: Multidetector CT imaging of the lower right extremity was performed according to the standard protocol following intravenous contrast administration. RADIATION DOSE REDUCTION: This exam was performed according to the departmental dose-optimization program which includes automated exposure control, adjustment of the mA and/or kV according to patient size and/or use of iterative reconstruction technique. CONTRAST:  OMNIPAQUE IOHEXOL 300 MG/ML  SOLN COMPARISON:  None Available. FINDINGS: Bones/Joint/Cartilage Normal alignment. No acute fracture or dislocation. 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 Joint spaces of the right ankle and visualized right hindfoot are preserved. No lytic or blastic bone lesion. No erosions or abnormal periosteal reaction. Ligaments Suboptimally assessed by CT. Muscles and Tendons Unremarkable Soft tissues Mild prepatellar edema. There is circumferential dermal thickening and subcutaneous edema, asymmetrically more severe along the medial aspect of the distal right foreleg at the level of the distal diaphysis of the tibia. No loculated subcutaneous fluid collection identified. Numerous superficial varicosities are identified arising from incompetent perforator veins from the distal posterior tibial vein, lateral gastrocnemius vein and lesser saphenous vein. IMPRESSION: 1. Circumferential dermal thickening and subcutaneous edema, asymmetrically more severe along the medial aspect of the distal right foreleg at the level of the distal diaphysis of the tibia. No loculated subcutaneous fluid collection identified. 2. 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 3. 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  4. Mild prepatellar subcutaneous edema. Electronically Signed   By: Helyn Numbers M.D.   On: 04/19/2023 23:30   DG Tibia/Fibula Right  Result Date: 04/19/2023 CLINICAL DATA:  Leg pain.  Cellulitis. EXAM: RIGHT TIBIA AND FIBULA - 2 VIEW COMPARISON:  02/20/2023 FINDINGS: There is no evidence of fracture or other focal bone lesions. No erosive change or periostitis. Knee and ankle alignment are maintained with knee degenerative change. Generalized soft tissue edema. No soft tissue gas. Scattered soft tissue phleboliths. No radiopaque foreign body peer IMPRESSION: Generalized soft tissue edema. No soft tissue gas or radiographic evidence of osteomyelitis. Electronically Signed   By: Narda Rutherford M.D.   On: 04/19/2023 22:19   VAS [place] LOWER EXTREMITY VENOUS (DVT) (7a-7p)  Result Date: 04/14/2023  Lower Venous DVT Study Patient Name:  DANESIA HUBER  Date of Exam:   04/13/2023 Medical Rec #: [id number]        Accession #:    [id number] Date of Birth: 28-Dec-1950        Patient Gender: F Patient Age:   34 years Exam Location:  [facility] Procedure:      VAS [place] LOWER EXTREMITY VENOUS (DVT) Referring Phys: ADAM CURATOLO --------------------------------------------------------------------------------  Indications: Pain, Swelling, and Patient presents with redness , swelling and pain in her right calf extending up into her mid thigh area. Other Indications: Breast surgery 2 days ago. Comparison Study: 02/27/23 - Negative Performing Technologist: Marilynne Halsted RDMS, RVT  Examination Guidelines: A complete evaluation includes B-mode imaging, spectral Doppler, color Doppler, and power Doppler as needed of all accessible portions of each vessel. Bilateral testing is considered an integral part of a complete examination. Limited examinations for reoccurring indications may be performed as noted. The reflux portion of the exam is performed with the patient in reverse Trendelenburg.   +---------+---------------+---------+-----------+----------+--------------+ RIGHT    CompressibilityPhasicitySpontaneityPropertiesThrombus Aging +---------+---------------+---------+-----------+----------+--------------+ CFV      Full           Yes      Yes                                 +---------+---------------+---------+-----------+----------+--------------+ SFJ      Full                                                        +---------+---------------+---------+-----------+----------+--------------+ FV Prox  Full                                                        +---------+---------------+---------+-----------+----------+--------------+ FV Mid   Full                                                        +---------+---------------+---------+-----------+----------+--------------+ FV DistalFull                                                        +---------+---------------+---------+-----------+----------+--------------+  PFV      Full                                                        +---------+---------------+---------+-----------+----------+--------------+ POP      Full           Yes      Yes                                 +---------+---------------+---------+-----------+----------+--------------+ PTV      Full                                                        +---------+---------------+---------+-----------+----------+--------------+ PERO     Full                                                        +---------+---------------+---------+-----------+----------+--------------+   +----+---------------+---------+-----------+----------+--------------+ LEFTCompressibilityPhasicitySpontaneityPropertiesThrombus Aging +----+---------------+---------+-----------+----------+--------------+ CFV Full           Yes      Yes                                  +----+---------------+---------+-----------+----------+--------------+ SFJ Full                                                        +----+---------------+---------+-----------+----------+--------------+    Summary: RIGHT: - There is no evidence of deep vein thrombosis in the lower extremity.  - No cystic structure found in the popliteal fossa. - Ultrasound characteristics of enlarged lymph nodes are noted in the groin.  LEFT: - No evidence of common femoral vein obstruction.   *See table(s) above for measurements and observations. Electronically signed by Sherald Hess MD on 04/14/2023 at 9:43:16 AM.    Final    MM Breast Surgical Specimen  Result Date: 04/11/2023 CLINICAL DATA:  Specimen radiograph status post left breast lumpectomy. EXAM: SPECIMEN RADIOGRAPH OF THE LEFT BREAST COMPARISON:  Previous exam(s). FINDINGS: Status post excision of the left breast. The radioactive seed and biopsy marker clip are present and completely intact within the specimen. These findings were communicated with the OR at 12:40 p.m. IMPRESSION: Specimen radiograph of the left breast. Electronically Signed   By: Frederico Hamman M.D.   On: 04/11/2023 12:41   MM CLIP PLACEMENT LEFT  Result Date: 04/10/2023 CLINICAL DATA:  Status post seed placement left breast EXAM: DIAGNOSTIC LEFT MAMMOGRAM POST ULTRASOUND-GUIDED RADIOACTIVE SEED PLACEMENT COMPARISON:  Previous exam(s). FINDINGS: Mammographic images were obtained following ultrasound-guided radioactive seed placement. These demonstrate radioactive seed within the left breast mass. IMPRESSION: Appropriate location of the radioactive seed. Final Assessment: Post Procedure Mammograms for Seed Placement BI-RADS CATEGORY  59M: Post-Procedure Mammogram for Marker Placement Electronically Signed  By: Annia Belt M.D.   On: 04/10/2023 11:50   [place] LT RADIOACTIVE SEED LOC  Result Date: 04/10/2023 CLINICAL DATA:  Patient for preoperative localization prior to left  lumpectomy. EXAM: ULTRASOUND GUIDED RADIOACTIVE SEED LOCALIZATION OF THE LEFT BREAST COMPARISON:  Previous exam(s). FINDINGS: Patient presents for radioactive seed localization prior to left lumpectomy. I met with the patient and we discussed the procedure of seed localization including benefits and alternatives. We discussed the high likelihood of a successful procedure. We discussed the risks of the procedure including infection, bleeding, tissue injury and further surgery. We discussed the low dose of radioactivity involved in the procedure. Informed, written consent was given. The usual time-out protocol was performed immediately prior to the procedure. Using ultrasound guidance, sterile technique, 1% lidocaine and an I-125 radioactive seed, left breast mass was localized using a lateral approach. The follow-up mammogram images confirm the seed in the expected location and were marked for Dr. Donell Beers. Follow-up survey of the patient confirms presence of the radioactive seed. Order number of I-125 seed:  [id number]. Total activity:  0.253 millicuries reference Date: 03/19/2023 The patient tolerated the procedure well and [facility]. She was given instructions regarding seed removal. IMPRESSION: Radioactive seed localization left breast. No apparent complications. Electronically Signed   By: Annia Belt M.D.   On: 04/10/2023 11:48       IMPRESSION/PLAN: 1. Stage IA, pT1cN0M0, grade 2, ER/PR positive invasive ductal carcinoma of the left breast. Dr. Mitzi Hansen has reviewed her final pathology findings and today we discussed these results.  She has done well since surgery.  We reviewed our telephone conversation which included that if she was willing to take antiestrogen therapy, she could consider foregoing radiation however that radiation is still an option for her given her functional status and interest in reducing risks of future recurrence.  We outlined how the purpose of external  radiotherapy to the breast is to reduce risks of local recurrence. We discussed the risks, benefits, short, and long term effects of radiotherapy, as well as the curative intent.  I reviewed the delivery and logistics of radiotherapy and anticipates a course of 4  weeks of radiotherapy to the left breast with deep inspiration breath-hold technique.  We also discussed the alternative to this would be an ultra hypofractionated course of radiation once weekly for 5 weeks.  At the conclusion of the visit, the patient has decided she would like to***   In a visit lasting ***minutes, greater than 50% of the time was spent face to face reviewing her case, as well as in preparation of, discussing, and coordinating the patient's care.     Osker Mason, [facility]    **Disclaimer: This note was dictated with voice recognition software. Similar sounding words can inadvertently be transcribed and this note may contain transcription errors which may not have been corrected upon publication of note.**

## 2023-05-08 ENCOUNTER — Encounter: Payer: Self-pay | Admitting: *Deleted

## 2023-05-08 ENCOUNTER — Encounter: Payer: Self-pay | Admitting: Hematology and Oncology

## 2023-05-08 ENCOUNTER — Inpatient Hospital Stay: Payer: Medicare HMO | Attending: Hematology and Oncology | Admitting: Hematology and Oncology

## 2023-05-08 VITALS — BP 127/57 | HR 68 | Temp 97.9°F | Resp 16 | Wt 201.7 lb

## 2023-05-08 DIAGNOSIS — Z79811 Long term (current) use of aromatase inhibitors: Secondary | ICD-10-CM | POA: Diagnosis not present

## 2023-05-08 DIAGNOSIS — C50412 Malignant neoplasm of upper-outer quadrant of left female breast: Secondary | ICD-10-CM | POA: Insufficient documentation

## 2023-05-08 DIAGNOSIS — Z87891 Personal history of nicotine dependence: Secondary | ICD-10-CM | POA: Insufficient documentation

## 2023-05-08 DIAGNOSIS — Z79899 Other long term (current) drug therapy: Secondary | ICD-10-CM | POA: Diagnosis not present

## 2023-05-08 DIAGNOSIS — Z170 Estrogen receptor positive status [ER+]: Secondary | ICD-10-CM | POA: Insufficient documentation

## 2023-05-08 DIAGNOSIS — Z79624 Long term (current) use of inhibitors of nucleotide synthesis: Secondary | ICD-10-CM | POA: Diagnosis not present

## 2023-05-08 MED ORDER — ANASTROZOLE 1 MG PO TABS: 1.0000 mg | ORAL_TABLET | Freq: Every day | ORAL | 3 refills | Status: DC

## 2023-05-08 NOTE — Assessment & Plan Note (Signed)
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  She had no memory of discussion about antiestrogen therapy.  Oncotype DX of 14, no benefit from adjuvant chemotherapy.  Early Stage Breast Cancer Post-surgical patient with no need for chemotherapy. Discussed the benefits of adjuvant hormonal therapy (estrogen blocker) for 5 years to reduce the risk of recurrence. Patient initially hesitant due to preference for natural medicine and concerns about side effects. -Prescribe estrogen blocker, to be taken once daily for 5 years.  Dispense anastrozole.  I will call her back in about 3 weeks to see how she is doing. -Bone density monitoring every 2 years on the medication -Start medication after radiation therapy, if radiation is recommended -Call patient in a couple of weeks to check on her well-being. -I once again discussed about adverse effects and mechanism of action of antiestrogen therapy including but not limited to fatigue, hair thinning, postmenopausal symptoms such as hot flashes, vaginal dryness, arthralgias and bone density loss.  Radiation Therapy Patient to meet with radiation oncologist to discuss the need for radiation therapy. -If radiation therapy is not needed, patient can start the estrogen blocker immediately. -If radiation therapy is needed, start the estrogen blocker after completion of radiation therapy.  Follow-up Patient to return in a 3 or 4 months for follow-up. Continue annual mammograms.

## 2023-05-08 NOTE — Progress Notes (Signed)
[facility] CONSULT NOTE  Patient Care Team: [name], MD as PCP - General (Infectious Diseases) Henrietta Hoover, NP (Family Medicine) Glendale Chard, DO as Consulting Physician (Neurology) Pershing Proud, RN as Oncology Nurse Navigator Donnelly Angelica, RN as Oncology Nurse Navigator Almond Lint, MD as Consulting Physician (General Surgery) Rachel Moulds, MD as Consulting Physician (Hematology and Oncology) Dorothy Puffer, MD as Consulting Physician (Radiation Oncology) Wyline Mood, RN as VBCI Care Management  CHIEF COMPLAINTS/PURPOSE OF CONSULTATION:  Newly diagnosed breast cancer  HISTORY OF PRESENTING ILLNESS:  Ruth Stephenson 72 y.o. female is here because of recent diagnosis of left breast cancer  I reviewed her records extensively and collaborated the history with the patient.  SUMMARY OF ONCOLOGIC HISTORY: Oncology History  Primary malignant neoplasm of upper outer quadrant of breast, left (HCC)  03/15/2023 Mammogram   Highly suspicious 1.5 cm UPPER-OUTER LEFT breast mass. Tissue sampling is recommended. Deep LEFT axillary lymph node with borderline cortical thickening. Attempt at tissue sampling is recommended.   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)   03/27/2023 Cancer Staging   Staging form: Breast, AJCC 8th Edition - Clinical stage from 03/27/2023: Stage IA (cT1c, cN0, cM0, G2, ER+, PR+, HER2-) - Signed by Ronny Bacon, PA-C on 03/27/2023 Stage prefix: Initial diagnosis Method of lymph node assessment: Clinical Histologic grading system: 3 grade system    Pathology Results    INVASIVE MODERATELY DIFFERENTIATED DUCTAL ADENOCARCINOMA, GRADE 2 (3+2+1)      DUCTAL CARCINOMA IN SITU, INTERMEDIATE NUCLEAR GRADE, SOLID AND CRIBRIFORM TYPES      WITHOUT NECROSIS      TUBULE FORMATION: SCORE 3      NUCLEAR PLEOMORPHISM: SCORE 2      MITOTIC COUNT: SCORE 1       TOTAL SCORE: 6      OVERALL GRADE GRADE 2 (6/9)      RARE MICROCALCIFICATION PRESENT WITHIN INVASIVE TUMOR      NEGATIVE FOR ANGIOLYMPHATIC INVASION      TUMOR MEASURES 11 MM IN GREATEST LINEAR EXTENT    The tumor cells are negative for Her2 (1+). Estrogen Receptor:  95%, POSITIVE, STRONG STAINING INTENSITY Progesterone Receptor:  30%, POSITIVE, STRONG STAINING INTENSITY Proliferation Marker Ki67:  5%     Genetic Testing   [facility] CancerNext-Expanded Panel+RNA was Negative. 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 Report date is 04/12/2023.  The CancerNext-Expanded gene panel offered by [facility] and includes sequencing, rearrangement, and RNA analysis for the following 71 genes: AIP, ALK, APC, ATM, AXIN2, BAP1, BARD1, BMPR1A, BRCA1, BRCA2, BRIP1, CDC73, CDH1, CDK4, CDKN1B, CDKN2A, CHEK2, CTNNA1, DICER1, FH, FLCN, KIF1B, LZTR1, MAX, MEN1, MET, MLH1, MSH2, MSH3, MSH6, MUTYH, NF1, NF2, NTHL1, PALB2, PHOX2B, PMS2, POT1, PRKAR1A, PTCH1, PTEN, RAD51C, RAD51D, RB1, RET, SDHA, SDHAF2, SDHB, SDHC, SDHD, SMAD4, SMARCA4, SMARCB1, SMARCE1, STK11, SUFU, TMEM127, TP53, TSC1, TSC2, and VHL (sequencing and deletion/duplication); EGFR, EGLN1, HOXB13, KIT, MITF, PDGFRA, POLD1, and POLE (sequencing only); EPCAM and GREM1 (deletion/duplication only).      The patient, with a history of colon cancer and HIV, presents with a recent diagnosis of breast cancer.   Discussed the use of AI scribe software for clinical note transcription with the patient, who gave verbal consent to proceed.  History of Present Illness    The patient, with a history of colon cancer, presents for a post-operative follow-up after breast cancer surgery.  She reports feeling overwhelmed with the number of appointments and the complexity of the treatment plan. She expresses a preference for natural medicine and initially expressed reluctance to take medication. However, she is open to  discussing the proposed anti estrogen therapy.  The patient also reports experiencing excessive sweating, which she has never experienced before, even during menopause. She is concerned about the potential side effects of the proposed medication, particularly hair loss and skin problems.  MEDICAL HISTORY:  Past Medical History:  Diagnosis Date   Angio-edema    Breast cancer (HCC)    Cancer (HCC)    Cancer of right colon (HCC) 10/24/2012   Discussed with h/o- no need for further f/u (08-2020)     Cirrhosis of liver (HCC)    had a liver u/s in 2014 that showed that she was cirrhotic.   Hepatitis C    treated ([facility]) 2015   History of COVID-19 06/03/2020   HIV (human immunodeficiency virus infection) (HCC)    Pneumonia 03/15/2021   in hospital 02-2021 with Pneumococcal pna and non-adherence to meds.   Polysubstance abuse (HCC)    Tuberculosis    neg chest xray    SURGICAL HISTORY: Past Surgical History:  Procedure Laterality Date   BREAST BIOPSY Left 03/22/2023   [place] LT BREAST BX W LOC DEV 1ST LESION IMG BX SPEC US GUIDE 03/22/2023 [facility] MAMMOGRAPHY   BREAST BIOPSY Left 04/10/2023   [place] LT RADIOACTIVE SEED LOC 04/10/2023 [facility] ULTRASOUND   BREAST LUMPECTOMY WITH RADIOACTIVE SEED LOCALIZATION Left 04/11/2023   Procedure: LEFT BREAST SEED LOCALIZED LUMPECTOMY;  Surgeon: Almond Lint, MD;  Location: [facility] OR;  Service: General;  Laterality: Left;   CARPAL TUNNEL RELEASE Left 01/19/2014   Procedure: LEFT CARPAL TUNNEL RELEASE;  Surgeon: Marlowe Shores, MD;  Location: [facility];  Service: Orthopedics;  Laterality: Left;   COLON RESECTION N/A 09/23/2012   Procedure: LAPAROSCOPIC RIGHT COLON RESECTION   ;  Surgeon: Mariella Saa, MD;  Location: [facility] ORS;  Service: General;  Laterality: N/A;  LAPOROSCOPY, RIGHT HEMICOLECTOMY   COLON SURGERY  09/23/2012   lap right colectomy   COLONOSCOPY N/A 09/20/2012   Procedure: COLONOSCOPY;  Surgeon: Theda Belfast, MD;  Location: [facility]  ENDOSCOPY;  Service: Endoscopy;  Laterality: N/A;   OPEN REDUCTION INTERNAL FIXATION (ORIF) DISTAL RADIAL FRACTURE Left 01/19/2014   Procedure: OPEN REDUCTION INTERNAL FIXATION (ORIF) LEFT  DISTAL RADIAL FRACTURE;  Surgeon: Marlowe Shores, MD;  Location: [facility];  Service: Orthopedics;  Laterality: Left;    SOCIAL HISTORY: Social History   Socioeconomic History   Marital status: Single    Spouse name: Not on file   Number of children: 5   Years of education: Not on file   Highest education level: Not on file  Occupational History   Not on file  Tobacco Use   Smoking status: Former    Current packs/day: 0.50    Average packs/day: 0.5 packs/day for 35.0 years (17.5 ttl pk-yrs)    Types: Cigarettes    Passive exposure: Past   Smokeless tobacco: Former  Building services engineer status: Never Used  Substance and Sexual Activity   Alcohol use: Not Currently   Drug use: Not Currently    Comment: methadone for drug rehab   Sexual activity: Not Currently    Birth control/protection: Post-menopausal    Comment: declined condoms  Other Topics Concern   Not on file  Social History Narrative   ** Merged  History Encounter **       Social Determinants of Health   Financial Resource Strain: Medium Risk (10/03/2022)   Overall Financial Resource Strain (CARDIA)    Difficulty of Paying Living Expenses: Somewhat hard  Food Insecurity: No Food Insecurity (04/20/2023)   Hunger Vital Sign    Worried About Running Out of Food in the Last Year: Never true    Ran Out of Food in the Last Year: Never true  Transportation Needs: No Transportation Needs (04/27/2023)   PRAPARE - Administrator, Civil Service (Medical): No    Lack of Transportation (Non-Medical): No  Physical Activity: Sufficiently Active (10/03/2022)   Exercise Vital Sign    Days of Exercise per Week: 7 days    Minutes of Exercise per Session: 40 min  Stress: Stress Concern Present (04/30/2023)   [employer] of Occupational [employer] - Occupational Stress Questionnaire    Feeling of Stress : Very much  Social Connections: Moderately Isolated (10/03/2022)   Social Connection and Isolation Panel [NHANES]    Frequency of Communication with Friends and Family: More than three times a week    Frequency of Social Gatherings with Friends and Family: Three times a week    Attends Religious Services: More than 4 times per year    Active Member of Clubs or Organizations: No    Attends Banker Meetings: Never    Marital Status: Never married  Intimate Partner Violence: Not At Risk (04/20/2023)   Humiliation, Afraid, Rape, and Kick questionnaire    Fear of Current or Ex-Partner: No    Emotionally Abused: No    Physically Abused: No    Sexually Abused: No    FAMILY HISTORY: Family History  Problem Relation Age of Onset   Angioedema Mother    Cancer Mother 8 - 38       unknown type   Asthma Brother    Throat cancer Maternal Uncle    Angioedema Maternal Grandmother     ALLERGIES:  has No Known Allergies.  MEDICATIONS:  Current Outpatient Medications  Medication Sig Dispense Refill   anastrozole (ARIMIDEX) 1 MG tablet Take 1 tablet (1 mg total) by mouth daily. 90 tablet 3   Cholecalciferol (VITAMIN D3) 50 MCG (2000 UT) capsule Take 2,000 Units by mouth daily.     DOVATO 50-300 MG tablet TAKE 1 TABLET BY MOUTH EVERY DAY 30 tablet 11   GINKGO BILOBA PLUS PO Take 120 mg by mouth daily.     linezolid (ZYVOX) 600 MG tablet Take 600 mg by mouth 2 (two) times daily.     Menatetrenone (VITAMIN K2) 100 MCG TABS Take 100 mg by mouth daily.     methadone (DOLOPHINE) 10 MG/5ML solution Take 23 mg by mouth in the morning.     oxyCODONE (OXY IR/ROXICODONE) 5 MG immediate release tablet Take 1 tablet (5 mg total) by mouth every 6 (six) hours as needed for severe pain (pain score 7-10). 15 tablet 0   No current facility-administered medications for this visit.    REVIEW OF SYSTEMS:    Constitutional: Denies fevers, chills or abnormal night sweats Eyes: Denies blurriness of vision, double vision or watery eyes Ears, nose, mouth, throat, and face: Denies mucositis or sore throat Respiratory: Denies cough, dyspnea or wheezes Cardiovascular: Denies palpitation, chest discomfort or lower extremity swelling Gastrointestinal:  Denies nausea, heartburn or change in bowel habits Skin: Denies abnormal skin rashes Lymphatics: Denies new lymphadenopathy or easy bruising Neurological:Denies numbness, tingling or  new weaknesses Behavioral/Psych: Mood is stable, no new changes  Breast: palpable lump in the left breast upper outer quadrant All other systems were reviewed with the patient and are negative.  PHYSICAL EXAMINATION: ECOG PERFORMANCE STATUS: 0 - Asymptomatic  Vitals:   05/08/23 1103  BP: (!) 127/57  Pulse: 68  Resp: 16  Temp: 97.9 F (36.6 C)  SpO2: 92%   Filed Weights   05/08/23 1103  Weight: 201 lb 11.2 oz (91.5 kg)    GENERAL:alert, no distress and comfortable PE deferred in lieu of counseling.  LABORATORY DATA:  I have reviewed the data as listed Lab Results  Component Value Date   WBC 2.2 (L) 04/22/2023   HGB 13.9 04/22/2023   HCT 42.9 04/22/2023   MCV 104.1 (H) 04/22/2023   PLT 131 (L) 04/22/2023   Lab Results  Component Value Date   NA 141 04/20/2023   K 5.0 04/20/2023   CL 106 04/20/2023   CO2 24 04/20/2023    RADIOGRAPHIC STUDIES: I have personally reviewed the radiological reports and agreed with the findings in the report.  ASSESSMENT AND PLAN:  Primary malignant neoplasm of upper outer quadrant of breast, left (HCC) This is a very pleasant 72 year old postmenopausal female patient with newly diagnosed left breast upper outer quadrant invasive ductal carcinoma staged T1c N0 ER 95% strong staining, PR 30% positive strong staining, Ki-67 of 5%, HER2 1+ by IHC presented at the [facility] she is now status post surgery and had no memory  of seeing me before.  She had no memory of discussion about antiestrogen therapy.  Oncotype DX of 14, no benefit from adjuvant chemotherapy.  Early Stage Breast Cancer Post-surgical patient with no need for chemotherapy. Discussed the benefits of adjuvant hormonal therapy (estrogen blocker) for 5 years to reduce the risk of recurrence. Patient initially hesitant due to preference for natural medicine and concerns about side effects. -Prescribe estrogen blocker, to be taken once daily for 5 years.  Dispense anastrozole.  I will call her back in about 3 weeks to see how she is doing. -Bone density monitoring every 2 years on the medication -Start medication after radiation therapy, if radiation is recommended -Call patient in a couple of weeks to check on her well-being. -I once again discussed about adverse effects and mechanism of action of antiestrogen therapy including but not limited to fatigue, hair thinning, postmenopausal symptoms such as hot flashes, vaginal dryness, arthralgias and bone density loss.  Radiation Therapy Patient to meet with radiation oncologist to discuss the need for radiation therapy. -If radiation therapy is not needed, patient can start the estrogen blocker immediately. -If radiation therapy is needed, start the estrogen blocker after completion of radiation therapy.  Follow-up Patient to return in a 3 or 4 months for follow-up. Continue annual mammograms.    All questions were answered. The patient knows to call the clinic with any problems, questions or concerns.    Rachel Moulds, MD 05/08/23

## 2023-05-10 ENCOUNTER — Ambulatory Visit: Payer: Medicare HMO | Admitting: Radiation Oncology

## 2023-05-10 ENCOUNTER — Ambulatory Visit: Payer: Medicare HMO

## 2023-05-10 ENCOUNTER — Ambulatory Visit: Admission: RE | Admit: 2023-05-10 | Payer: Medicare HMO | Source: Ambulatory Visit | Admitting: Radiation Oncology

## 2023-05-10 DIAGNOSIS — C50412 Malignant neoplasm of upper-outer quadrant of left female breast: Secondary | ICD-10-CM

## 2023-05-10 NOTE — Progress Notes (Signed)
Radiation Oncology         [phone] ________________________________  Name: Ruth Stephenson        MRN: [id number]  Date of Service: 05/16/2023 DOB: [birth year]  CC:Ruth Smart, MD  Ruth Moulds, MD     REFERRING PHYSICIAN: Rachel Moulds, MD   DIAGNOSIS: The encounter diagnosis was Malignant neoplasm of upper-outer quadrant of left breast in female, estrogen receptor positive (HCC).   HISTORY OF PRESENT ILLNESS: Ruth Stephenson is a 72 y.o. female originally seen in the multidisciplinary breast clinic for a new diagnosis of left breast cancer. The patient was noted to have a palpable mass in the left breast and diagnostic mammogram on 03/15/2023 showed a spiculated mass in the upper outer left breast.  No abnormalities were noted in the right breast.  By ultrasound the mass was in the 2 o'clock position of the left breast measuring 1.5 cm in greatest dimension with a deep left axillary lymph node bordering on concern with cortical thickening of 3.3 mm no other abnormalities were appreciated.  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  The cancer was ER/PR positive, HER2 was negative with a Ki-67 of 5%.  Of note at the time of her biopsy there was no definitive morphologically abnormal lymph nodes so a biopsy was not performed in this area.   Since her last visit the patient underwent a left lumpectomy on 04/11/2023 that showed a grade 2 invasive ductal carcinoma measuring 1.8 cm, there was associated intermediate grade DCIS and margins were negative for carcinoma, margins were also negative.  This understanding that the patient had about needing back to discuss radiation but I spoke with her on the phone last week to outline that she had the ability to consider foregoing radiation provided that she take antiestrogen therapy.  She wanted  some additional time to consider this, we stopped both about hypofractionated radiation versus ultra hypofractionated courses and she is seen today to discuss her decisions.    PREVIOUS RADIATION THERAPY: No   PAST MEDICAL HISTORY:  Past Medical History:  Diagnosis Date   Angio-edema    Breast cancer (HCC)    Cancer (HCC)    Cancer of right colon (HCC) 10/24/2012   Discussed with h/o- no need for further f/u (08-2020)     Cirrhosis of liver (HCC)    had a liver u/s in 2014 that showed that she was cirrhotic.   Hepatitis C    treated ([facility]) 2015   History of COVID-19 06/03/2020   HIV (human immunodeficiency virus infection) (HCC)    Pneumonia 03/15/2021   in hospital 02-2021 with Pneumococcal pna and non-adherence to meds.   Polysubstance abuse (HCC)    Tuberculosis    neg chest xray       PAST SURGICAL HISTORY: Past Surgical History:  Procedure Laterality Date   BREAST BIOPSY Left 03/22/2023   [place] LT BREAST BX W LOC DEV 1ST LESION IMG BX SPEC US GUIDE 03/22/2023 [facility] MAMMOGRAPHY   BREAST BIOPSY Left 04/10/2023   [place] LT RADIOACTIVE SEED LOC 04/10/2023 [facility] ULTRASOUND   BREAST LUMPECTOMY WITH RADIOACTIVE SEED LOCALIZATION Left 04/11/2023   Procedure: LEFT BREAST SEED LOCALIZED LUMPECTOMY;  Surgeon: Almond Lint, MD;  Location: [facility];  Service: General;  Laterality: Left;   CARPAL TUNNEL RELEASE Left 01/19/2014   Procedure: LEFT CARPAL TUNNEL RELEASE;  Surgeon: Marlowe Shores,  MD;  Location: [facility];  Service: Orthopedics;  Laterality: Left;   COLON RESECTION N/A 09/23/2012   Procedure: LAPAROSCOPIC RIGHT COLON RESECTION   ;  Surgeon: Mariella Saa, MD;  Location: [facility];  Service: General;  Laterality: N/A;  LAPOROSCOPY, RIGHT HEMICOLECTOMY   COLON SURGERY  09/23/2012   lap right colectomy   COLONOSCOPY N/A 09/20/2012   Procedure: COLONOSCOPY;  Surgeon: Theda Belfast, MD;  Location: [facility];  Service: Endoscopy;  Laterality: N/A;   OPEN  REDUCTION INTERNAL FIXATION (ORIF) DISTAL RADIAL FRACTURE Left 01/19/2014   Procedure: OPEN REDUCTION INTERNAL FIXATION (ORIF) LEFT  DISTAL RADIAL FRACTURE;  Surgeon: Marlowe Shores, MD;  Location: [facility];  Service: Orthopedics;  Laterality: Left;     FAMILY HISTORY:  Family History  Problem Relation Age of Onset   Angioedema Mother    Cancer Mother 32 - 29       unknown type   Asthma Brother    Throat cancer Maternal Uncle    Angioedema Maternal Grandmother      SOCIAL HISTORY:  reports that she has quit smoking. Her smoking use included cigarettes. She has a 17.5 pack-year smoking history. She has been exposed to tobacco smoke. She has quit using smokeless tobacco. She reports that she does not currently use alcohol. She reports that she does not currently use drugs.  The patient is single and lives in [place].  She has a large extended family but prefers to keep her health information private. She is active in her church and values her pastor's input and plans to talk with him.    ALLERGIES: Patient has no known allergies.   MEDICATIONS:  Current Outpatient Medications  Medication Sig Dispense Refill   anastrozole (ARIMIDEX) 1 MG tablet Take 1 tablet (1 mg total) by mouth daily. 90 tablet 3   Cholecalciferol (VITAMIN D3) 50 MCG (2000 UT) capsule Take 2,000 Units by mouth daily.     DOVATO 50-300 MG tablet TAKE 1 TABLET BY MOUTH EVERY DAY 30 tablet 11   GINKGO BILOBA PLUS PO Take 120 mg by mouth daily.     linezolid (ZYVOX) 600 MG tablet Take 600 mg by mouth 2 (two) times daily.     Menatetrenone (VITAMIN K2) 100 MCG TABS Take 100 mg by mouth daily.     methadone (DOLOPHINE) 10 MG/5ML solution Take 23 mg by mouth in the morning.     oxyCODONE (OXY IR/ROXICODONE) 5 MG immediate release tablet Take 1 tablet (5 mg total) by mouth every 6 (six) hours as needed for severe pain (pain score 7-10). 15 tablet 0   No current facility-administered medications for  this visit.     REVIEW OF SYSTEMS: On review of systems, the patient reports that she is doing well since her surgery. She denies any concerns about her healing but wishes to have less frequent treatment appointments because of how she feels coming to the center her mother was treated in. No other complaints are verbalized.      PHYSICAL EXAM:  Wt Readings from Last 3 Encounters:  05/08/23 201 lb 11.2 oz (91.5 kg)  05/01/23 207 lb 1.6 oz (93.9 kg)  04/19/23 200 lb (90.7 kg)   Temp Readings from Last 3 Encounters:  05/08/23 97.9 F (36.6 C) (Temporal)  05/01/23 98.5 F (36.9 C) (Oral)  04/22/23 98.4 F (36.9 C) (Oral)   BP Readings from Last 3 Encounters:  05/08/23 (!) 127/57  05/01/23 (!) 107/47  04/22/23 Marland Kitchen)  140/75   Pulse Readings from Last 3 Encounters:  05/08/23 68  05/01/23 75  04/22/23 61    In general this is a well appearing African American female in no acute distress. She's alert and oriented x4 and appropriate throughout the examination. Cardiopulmonary assessment is negative for acute distress and she exhibits normal effort.  The left breast reveals well-healed surgical incision site without erythema separation or drainage.    ECOG = 0  0 - Asymptomatic (Fully active, able to carry on all predisease activities without restriction)  1 - Symptomatic but completely ambulatory (Restricted in physically strenuous activity but ambulatory and able to carry out work of a light or sedentary nature. For example, light housework, office work)  2 - Symptomatic, <50% in bed during the day (Ambulatory and capable of all self care but unable to carry out any work activities. Up and about more than 50% of waking hours)  3 - Symptomatic, >50% in bed, but not bedbound (Capable of only limited self-care, confined to bed or chair 50% or more of waking hours)  4 - Bedbound (Completely disabled. Cannot carry on any self-care. Totally confined to bed or chair)  5 - Death    Santiago Glad MM, Creech RH, Tormey DC, et al. [phone]). "Toxicity and response criteria of the [facility] Group". Am. Evlyn Clines. Oncol. 5 (6): 649-55    LABORATORY DATA:  Lab Results  Component Value Date   WBC 2.2 (L) 04/22/2023   HGB 13.9 04/22/2023   HCT 42.9 04/22/2023   MCV 104.1 (H) 04/22/2023   PLT 131 (L) 04/22/2023   Lab Results  Component Value Date   NA 141 04/20/2023   K 5.0 04/20/2023   CL 106 04/20/2023   CO2 24 04/20/2023   Lab Results  Component Value Date   ALT 17 04/20/2023   AST 32 04/20/2023   ALKPHOS 70 04/20/2023   BILITOT 1.6 (H) 04/20/2023      RADIOGRAPHY: VAS [place] LOWER EXTREMITY VENOUS REFLUX  Result Date: 05/04/2023  Lower Venous Reflux Study Patient Name:  BEYLA SCHALLHORN  Date of Exam:   05/04/2023 Medical Rec #: [id number]        Accession #:    [id number] Date of Birth: 07/04/1950        Patient Gender: F Patient Age:   71 years Exam Location:  [facility] Procedure:      VAS [place] LOWER EXTREMITY VENOUS REFLUX Referring Phys: Emilie Rutter --------------------------------------------------------------------------------  Indications: Edema.  Comparison Study: No prior study Performing Technologist: Gertie Fey MHA, RDMS, RVT, RDCS  Examination Guidelines: A complete evaluation includes B-mode imaging, spectral Doppler, color Doppler, and power Doppler as needed of all accessible portions of each vessel. Bilateral testing is considered an integral part of a complete examination. Limited examinations for reoccurring indications may be performed as noted. The reflux portion of the exam is performed with the patient in reverse Trendelenburg. Significant venous reflux is defined as >500 ms in the superficial venous system, and >1 second in the deep venous system.  Venous Reflux Times +--------------+---------+------+-----------+------------+--------+ RIGHT         Reflux NoRefluxReflux TimeDiameter cmsComments                          Yes                                  +--------------+---------+------+-----------+------------+--------+  CFV                     yes   >1 second                      +--------------+---------+------+-----------+------------+--------+ FV mid        no                                             +--------------+---------+------+-----------+------------+--------+ Popliteal     no                                             +--------------+---------+------+-----------+------------+--------+ GSV at SFJ              yes    >500 ms      0.95             +--------------+---------+------+-----------+------------+--------+ GSV prox thigh          yes    >500 ms      0.57             +--------------+---------+------+-----------+------------+--------+ GSV mid thigh           yes    >500 ms      0.61             +--------------+---------+------+-----------+------------+--------+ GSV dist thigh          yes    >500 ms      0.64             +--------------+---------+------+-----------+------------+--------+ GSV at knee             yes    >500 ms      0.57             +--------------+---------+------+-----------+------------+--------+ GSV prox calf no                            0.32             +--------------+---------+------+-----------+------------+--------+ SSV Pop Fossa no                            0.34             +--------------+---------+------+-----------+------------+--------+ SSV prox calf no                            0.37             +--------------+---------+------+-----------+------------+--------+   Summary: Right: - No evidence of deep vein thrombosis seen in the right lower extremity, from the common femoral through the popliteal veins. - No evidence of superficial venous thrombosis in the right lower extremity.  - The deep venous system is incompetent at the common femoral vein. - The great saphenous vein is incompetent. -  The small saphenous vein is competent. *See table(s) above for measurements and observations. Electronically signed by Carolynn Sayers on 05/04/2023 at 2:38:27 PM.    Final    VAS [place] LOWER EXTREMITY VENOUS (DVT)  Result Date: 04/20/2023  Lower Venous DVT Study Patient Name:  DANEE WOERNER  Date of Exam:   04/20/2023 Medical  Rec #: [id number]        Accession #:    [id number] Date of Birth: [birth year]        Patient Gender: F Patient Age:   28 years Exam Location:  [facility] Procedure:      VAS [place] LOWER EXTREMITY VENOUS (DVT) Referring Phys: Dolly Rias --------------------------------------------------------------------------------  Indications: Swelling.  Risk Factors: None identified. Limitations: Poor ultrasound/tissue interface. Comparison Study: 04/13/2023 - RIGHT:                    - There is no evidence of deep vein thrombosis in the lower                   extremity.                    - No cystic structure found in the popliteal fossa.                   - Ultrasound characteristics of enlarged lymph nodes are noted                   in the                   groin.                    LEFT:                   - No evidence of common femoral vein obstruction. Performing Technologist: Chanda Busing RVT  Examination Guidelines: A complete evaluation includes B-mode imaging, spectral Doppler, color Doppler, and power Doppler as needed of all accessible portions of each vessel. Bilateral testing is considered an integral part of a complete examination. Limited examinations for reoccurring indications may be performed as noted. The reflux portion of the exam is performed with the patient in reverse Trendelenburg.  +---------+---------------+---------+-----------+----------+-------------------+ RIGHT    CompressibilityPhasicitySpontaneityPropertiesThrombus Aging      +---------+---------------+---------+-----------+----------+-------------------+ CFV      Full           Yes      Yes                                       +---------+---------------+---------+-----------+----------+-------------------+ SFJ      Full                                                             +---------+---------------+---------+-----------+----------+-------------------+ FV Prox  Full                                                             +---------+---------------+---------+-----------+----------+-------------------+ FV Mid   Full                                                             +---------+---------------+---------+-----------+----------+-------------------+  FV DistalFull           Yes      Yes                                      +---------+---------------+---------+-----------+----------+-------------------+ PFV      Full                                                             +---------+---------------+---------+-----------+----------+-------------------+ POP      Full           Yes      Yes                                      +---------+---------------+---------+-----------+----------+-------------------+ PTV      Full                                                             +---------+---------------+---------+-----------+----------+-------------------+ PERO                                                  Not well visualized +---------+---------------+---------+-----------+----------+-------------------+   +----+---------------+---------+-----------+----------+--------------+ LEFTCompressibilityPhasicitySpontaneityPropertiesThrombus Aging +----+---------------+---------+-----------+----------+--------------+ CFV Full           Yes      Yes                                 +----+---------------+---------+-----------+----------+--------------+     Summary: RIGHT: - There is no evidence of deep vein thrombosis in the lower extremity. However, portions of this examination were limited- see technologist comments above.  -  No cystic structure found in the popliteal fossa.  LEFT: - No evidence of common femoral vein obstruction.   *See table(s) above for measurements and observations. Electronically signed by Gerarda Fraction on 04/20/2023 at 5:01:31 PM.    Final    CT TIBIA FIBULA RIGHT W CONTRAST  Result Date: 04/19/2023 CLINICAL DATA:  Right lower extremity soft tissue infection EXAM: CT OF THE LOWER RIGHT EXTREMITY WITH CONTRAST TECHNIQUE: Multidetector CT imaging of the lower right extremity was performed according to the standard protocol following intravenous contrast administration. RADIATION DOSE REDUCTION: This exam was performed according to the departmental dose-optimization program which includes automated exposure control, adjustment of the mA and/or kV according to patient size and/or use of iterative reconstruction technique. CONTRAST:  OMNIPAQUE IOHEXOL 300 MG/ML  SOLN COMPARISON:  None Available. FINDINGS: Bones/Joint/Cartilage Normal alignment. No acute fracture or dislocation. 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 Joint spaces of the right ankle and visualized right hindfoot are preserved. No lytic or blastic bone lesion. No erosions or abnormal periosteal reaction. Ligaments Suboptimally assessed by CT. Muscles and Tendons Unremarkable Soft tissues Mild prepatellar edema. There is circumferential dermal  thickening and subcutaneous edema, asymmetrically more severe along the medial aspect of the distal right foreleg at the level of the distal diaphysis of the tibia. No loculated subcutaneous fluid collection identified. Numerous superficial varicosities are identified arising from incompetent perforator veins from the distal posterior tibial vein, lateral gastrocnemius vein and lesser saphenous vein. IMPRESSION: 1. Circumferential dermal thickening and subcutaneous edema, asymmetrically more severe along the medial aspect of the distal right foreleg at the level of the  distal diaphysis of the tibia. No loculated subcutaneous fluid collection identified. 2. 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 3. 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 4. Mild prepatellar subcutaneous edema. Electronically Signed   By: Helyn Numbers M.D.   On: 04/19/2023 23:30   DG Tibia/Fibula Right  Result Date: 04/19/2023 CLINICAL DATA:  Leg pain.  Cellulitis. EXAM: RIGHT TIBIA AND FIBULA - 2 VIEW COMPARISON:  02/20/2023 FINDINGS: There is no evidence of fracture or other focal bone lesions. No erosive change or periostitis. Knee and ankle alignment are maintained with knee degenerative change. Generalized soft tissue edema. No soft tissue gas. Scattered soft tissue phleboliths. No radiopaque foreign body peer IMPRESSION: Generalized soft tissue edema. No soft tissue gas or radiographic evidence of osteomyelitis. Electronically Signed   By: Narda Rutherford M.D.   On: 04/19/2023 22:19   VAS [place] LOWER EXTREMITY VENOUS (DVT) (7a-7p)  Result Date: 04/14/2023  Lower Venous DVT Study Patient Name:  CHEVELLE PRISCO  Date of Exam:   04/13/2023 Medical Rec #: [id number]        Accession #:    [id number] Date of Birth: 1951/01/23        Patient Gender: F Patient Age:   23 years Exam Location:  [facility] Procedure:      VAS [place] LOWER EXTREMITY VENOUS (DVT) Referring Phys: ADAM CURATOLO --------------------------------------------------------------------------------  Indications: Pain, Swelling, and Patient presents with redness , swelling and pain in her right calf extending up into her mid thigh area. Other Indications: Breast surgery 2 days ago. Comparison Study: 02/27/23 - Negative Performing Technologist: Marilynne Halsted RDMS, RVT  Examination Guidelines: A complete evaluation includes B-mode imaging, spectral Doppler, color Doppler,  and power Doppler as needed of all accessible portions of each vessel. Bilateral testing is considered an integral part of a complete examination. Limited examinations for reoccurring indications may be performed as noted. The reflux portion of the exam is performed with the patient in reverse Trendelenburg.  +---------+---------------+---------+-----------+----------+--------------+ RIGHT    CompressibilityPhasicitySpontaneityPropertiesThrombus Aging +---------+---------------+---------+-----------+----------+--------------+ CFV      Full           Yes      Yes                                 +---------+---------------+---------+-----------+----------+--------------+ SFJ      Full                                                        +---------+---------------+---------+-----------+----------+--------------+ FV Prox  Full                                                        +---------+---------------+---------+-----------+----------+--------------+  FV Mid   Full                                                        +---------+---------------+---------+-----------+----------+--------------+ FV DistalFull                                                        +---------+---------------+---------+-----------+----------+--------------+ PFV      Full                                                        +---------+---------------+---------+-----------+----------+--------------+ POP      Full           Yes      Yes                                 +---------+---------------+---------+-----------+----------+--------------+ PTV      Full                                                        +---------+---------------+---------+-----------+----------+--------------+ PERO     Full                                                        +---------+---------------+---------+-----------+----------+--------------+    +----+---------------+---------+-----------+----------+--------------+ LEFTCompressibilityPhasicitySpontaneityPropertiesThrombus Aging +----+---------------+---------+-----------+----------+--------------+ CFV Full           Yes      Yes                                 +----+---------------+---------+-----------+----------+--------------+ SFJ Full                                                        +----+---------------+---------+-----------+----------+--------------+    Summary: RIGHT: - There is no evidence of deep vein thrombosis in the lower extremity.  - No cystic structure found in the popliteal fossa. - Ultrasound characteristics of enlarged lymph nodes are noted in the groin.  LEFT: - No evidence of common femoral vein obstruction.   *See table(s) above for measurements and observations. Electronically signed by Sherald Hess MD on 04/14/2023 at 9:43:16 AM.    Final    MM Breast Surgical Specimen  Result Date: 04/11/2023 CLINICAL DATA:  Specimen radiograph status post left breast lumpectomy. EXAM: SPECIMEN RADIOGRAPH OF THE LEFT BREAST COMPARISON:  Previous exam(s). FINDINGS: Status post excision of the left breast. The radioactive seed and biopsy  marker clip are present and completely intact within the specimen. These findings were communicated with the OR at 12:40 p.m. IMPRESSION: Specimen radiograph of the left breast. Electronically Signed   By: Frederico Hamman M.D.   On: 04/11/2023 12:41       IMPRESSION/PLAN: 1. Stage IA, [id number], grade 2, ER/PR positive invasive ductal carcinoma of the left breast. Dr. Mitzi Hansen has reviewed her final pathology findings and today we discussed these results.  She has done well since surgery.  We reviewed our telephone conversation which included that if she was willing to take antiestrogen therapy, she could consider foregoing radiation however that radiation is still an option for her given her functional status and interest in  reducing risks of future recurrence. She is open to the idea of antiestrogen at the moment but is concerned about her ability to commit to a medication for 5 years.  We outlined how the purpose of external radiotherapy to the breast is to reduce risks of local recurrence. We discussed the risks, benefits, short, and long term effects of radiotherapy, as well as the curative intent.  I reviewed the delivery and logistics of radiotherapy and anticipates a course of 4  weeks of radiotherapy to the left breast with deep inspiration breath-hold technique.  We also discussed the alternative to this would be an ultra hypofractionated course of radiation once weekly for 5 weeks.  At the conclusion of the visit, the patient has decided she would like to proceed with an ultrahypofractionated course. She will receive this once weekly for 5 weeks. Written consent is obtained and placed in the chart, a copy was provided to the patient. She will simulate today.    In a visit lasting 60 minutes, greater than 50% of the time was spent face to face reviewing her case, as well as in preparation of, discussing, and coordinating the patient's care.     Osker Mason, [facility]    **Disclaimer: This note was dictated with voice recognition software. Similar sounding words can inadvertently be transcribed and this note may contain transcription errors which may not have been corrected upon publication of note.**

## 2023-05-11 ENCOUNTER — Telehealth: Payer: Self-pay | Admitting: Infectious Diseases

## 2023-05-11 NOTE — Telephone Encounter (Signed)
Called pt to let her know that her insurance will cover cabaneuva.  Clinic will call her to schedule

## 2023-05-14 ENCOUNTER — Encounter: Payer: Self-pay | Admitting: *Deleted

## 2023-05-14 DIAGNOSIS — F1120 Opioid dependence, uncomplicated: Secondary | ICD-10-CM | POA: Diagnosis not present

## 2023-05-16 ENCOUNTER — Ambulatory Visit
Admission: RE | Admit: 2023-05-16 | Discharge: 2023-05-16 | Disposition: A | Discharge status: Home / Self Care | Payer: Medicare HMO | Source: Ambulatory Visit | Attending: Radiation Oncology | Admitting: Radiation Oncology

## 2023-05-16 ENCOUNTER — Encounter: Payer: Self-pay | Admitting: Radiation Oncology

## 2023-05-16 ENCOUNTER — Other Ambulatory Visit: Payer: Self-pay

## 2023-05-16 ENCOUNTER — Ambulatory Visit
Admission: RE | Admit: 2023-05-16 | Discharge: 2023-05-16 | Discharge status: Home / Self Care | Payer: Medicare HMO | Source: Ambulatory Visit | Attending: Radiation Oncology | Admitting: Radiation Oncology

## 2023-05-16 VITALS — BP 118/60 | HR 64 | Temp 97.1°F | Resp 18 | Ht 63.0 in | Wt 202.5 lb

## 2023-05-16 DIAGNOSIS — C50412 Malignant neoplasm of upper-outer quadrant of left female breast: Secondary | ICD-10-CM

## 2023-05-16 DIAGNOSIS — K7460 Unspecified cirrhosis of liver: Secondary | ICD-10-CM | POA: Diagnosis not present

## 2023-05-16 DIAGNOSIS — Z170 Estrogen receptor positive status [ER+]: Secondary | ICD-10-CM | POA: Insufficient documentation

## 2023-05-16 DIAGNOSIS — M1711 Unilateral primary osteoarthritis, right knee: Secondary | ICD-10-CM | POA: Insufficient documentation

## 2023-05-16 DIAGNOSIS — Z79899 Other long term (current) drug therapy: Secondary | ICD-10-CM | POA: Insufficient documentation

## 2023-05-16 DIAGNOSIS — Z87891 Personal history of nicotine dependence: Secondary | ICD-10-CM | POA: Diagnosis not present

## 2023-05-16 DIAGNOSIS — B20 Human immunodeficiency virus [HIV] disease: Secondary | ICD-10-CM | POA: Diagnosis not present

## 2023-05-16 DIAGNOSIS — Z510 Encounter for antineoplastic radiation therapy: Secondary | ICD-10-CM | POA: Diagnosis not present

## 2023-05-16 DIAGNOSIS — Z8616 Personal history of COVID-19: Secondary | ICD-10-CM | POA: Diagnosis not present

## 2023-05-16 DIAGNOSIS — R609 Edema, unspecified: Secondary | ICD-10-CM | POA: Insufficient documentation

## 2023-05-16 DIAGNOSIS — Z85038 Personal history of other malignant neoplasm of large intestine: Secondary | ICD-10-CM | POA: Diagnosis not present

## 2023-05-16 NOTE — Progress Notes (Addendum)
Nursing interview for Malignant neoplasm of upper-outer quadrant of left breast in female, estrogen receptor positive (HCC).    Patient identity verified x2.  Patient reports occasional mild nausea, fatigue, and a high stress level 10/10. Patient denies chest/ arm pain, SOB and any other related issues at this time.  Meaningful use complete.  Vitals- BP 118/60 (BP Location: Right Arm, Patient Position: Sitting, Cuff Size: Normal)   Pulse 64   Temp (!) 97.1 F (36.2 C) (Temporal)   Resp 18   Ht 5\' 3"  (1.6 m)   Wt 202 lb 8 oz (91.9 kg)   SpO2 97%   BMI 35.87 kg/m   This concludes the interaction.  Ruel Favors, LPN

## 2023-05-21 DIAGNOSIS — F1120 Opioid dependence, uncomplicated: Secondary | ICD-10-CM | POA: Diagnosis not present

## 2023-05-28 ENCOUNTER — Ambulatory Visit
Admission: RE | Admit: 2023-05-28 | Discharge: 2023-05-28 | Disposition: A | Discharge status: Home / Self Care | Payer: Medicare HMO | Source: Ambulatory Visit | Attending: Radiation Oncology | Admitting: Radiation Oncology

## 2023-05-28 DIAGNOSIS — C50412 Malignant neoplasm of upper-outer quadrant of left female breast: Secondary | ICD-10-CM | POA: Diagnosis not present

## 2023-05-28 DIAGNOSIS — Z510 Encounter for antineoplastic radiation therapy: Secondary | ICD-10-CM | POA: Diagnosis not present

## 2023-05-28 DIAGNOSIS — F1120 Opioid dependence, uncomplicated: Secondary | ICD-10-CM | POA: Diagnosis not present

## 2023-05-28 DIAGNOSIS — Z170 Estrogen receptor positive status [ER+]: Secondary | ICD-10-CM | POA: Insufficient documentation

## 2023-05-28 IMAGING — CT CT HEAD W/O CM
3 series · 16 of 47 positions shown, 19 images · non-contrast
Comparison: CT brain 05/04/2017

CLINICAL DATA: Headache body aches

EXAM:
CT HEAD WITHOUT CONTRAST
TECHNIQUE: Contiguous axial images were obtained from the base of the skull
through the vertex without intravenous contrast.

[Series 2: head wo · axial · 0.43mm/px · z∈[-30,+95]mm · 10 of 31 slices shown, 13 images]
[im 3/31  brain]
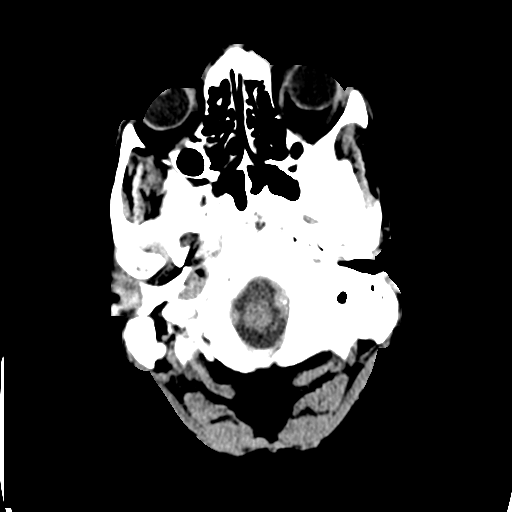
[im 3/31  bone]
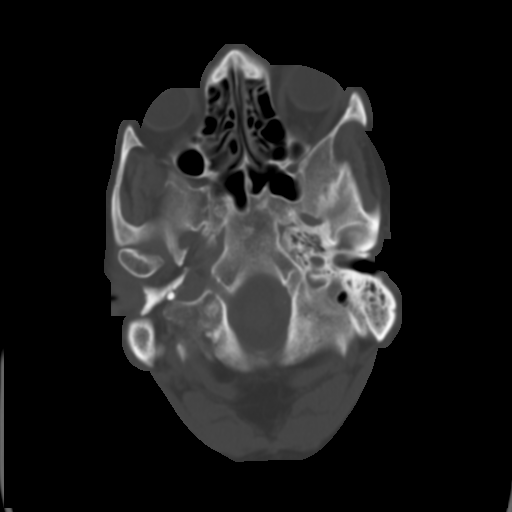
[im 6/31  brain]
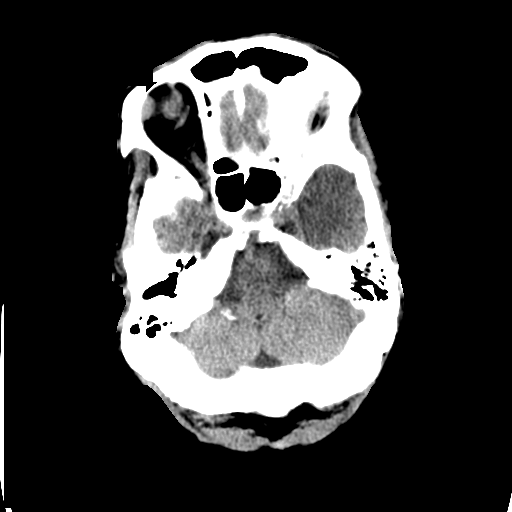
[im 9/31  brain]
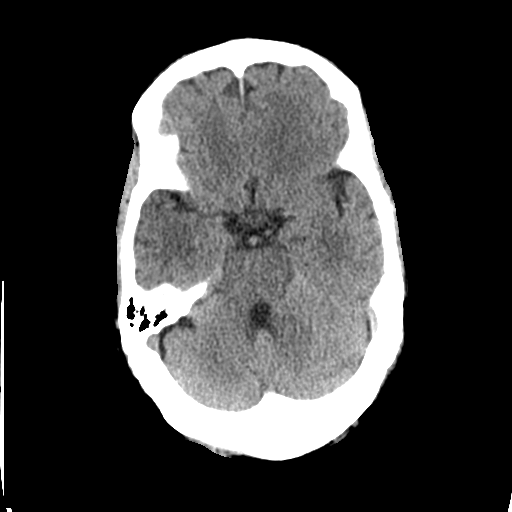
[im 11/31  brain]
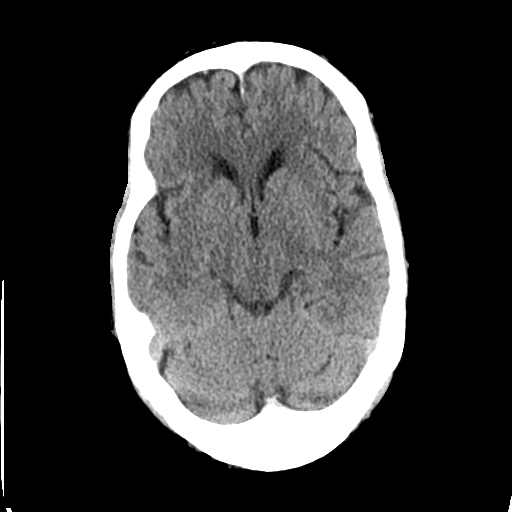
[im 14/31  brain]
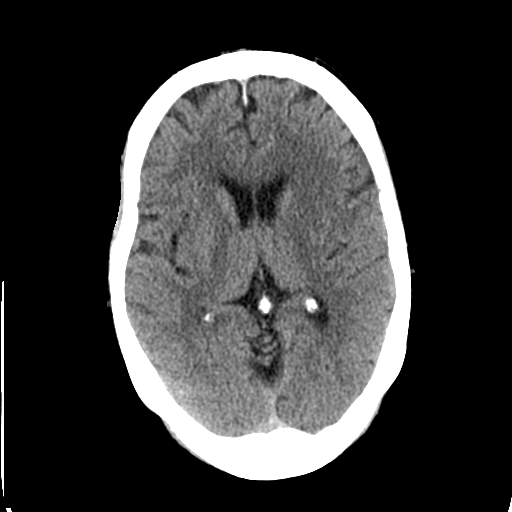
[im 14/31  bone]
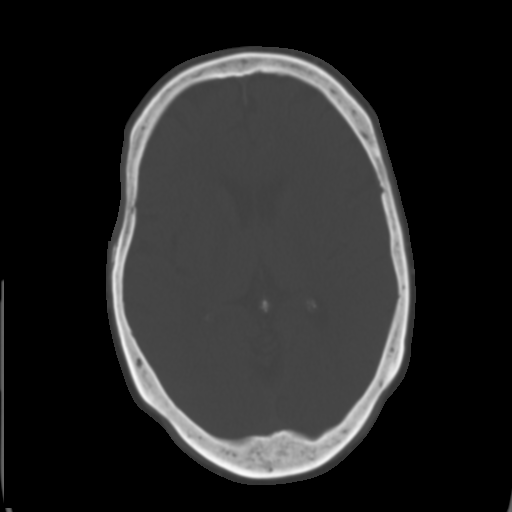
[im 17/31  brain]
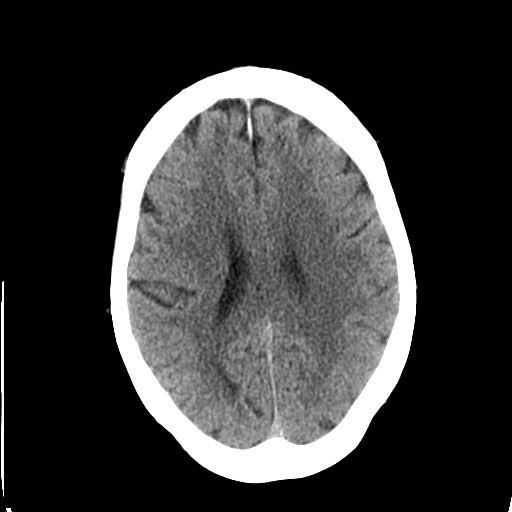
[im 20/31  brain]
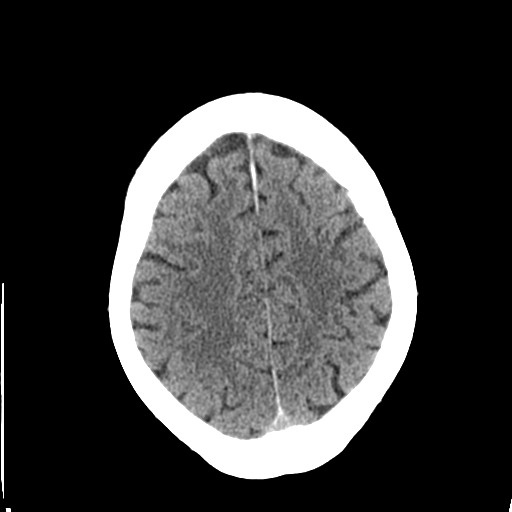
[im 23/31  brain]
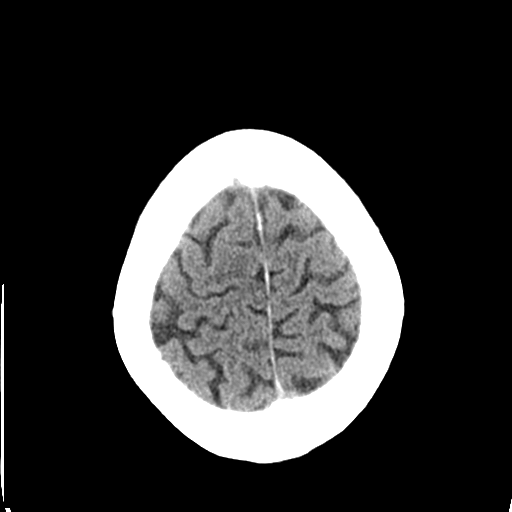
[im 25/31  brain]
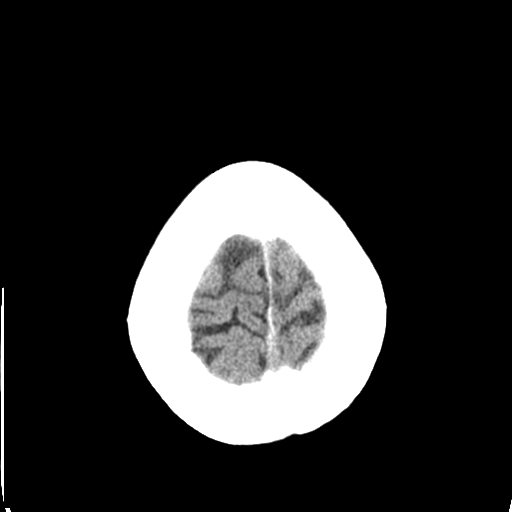
[im 25/31  bone]
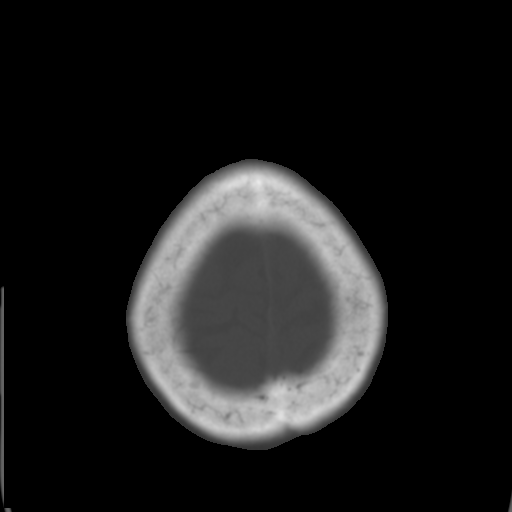
[im 28/31  brain]
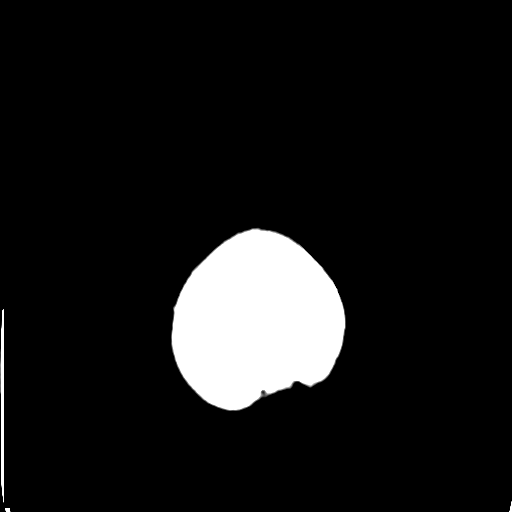

[Series 5: coronal soft tissue · coronal · 0.30mm/px · 3 of 67 slices shown]
[im 23/67  brain]
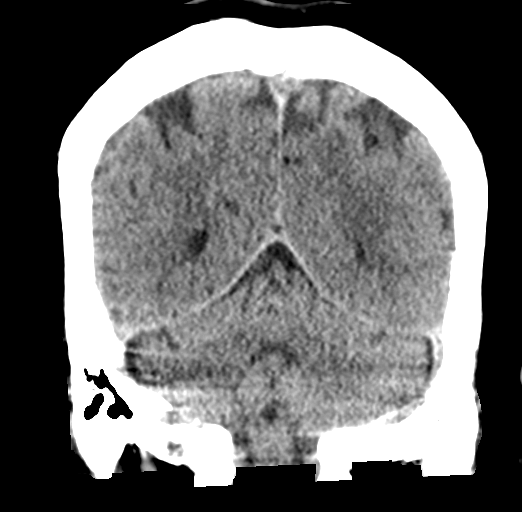
[im 30/67  brain]
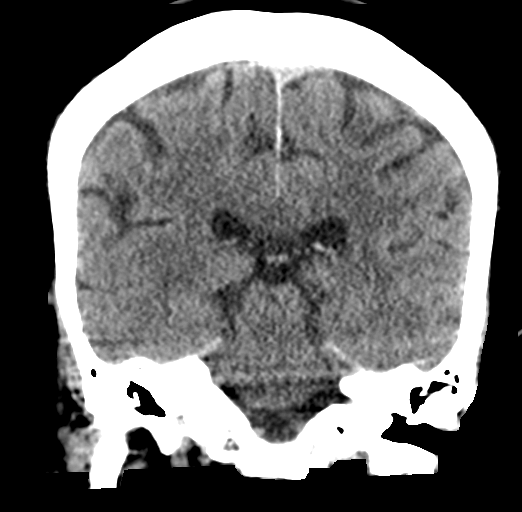
[im 37/67  brain]
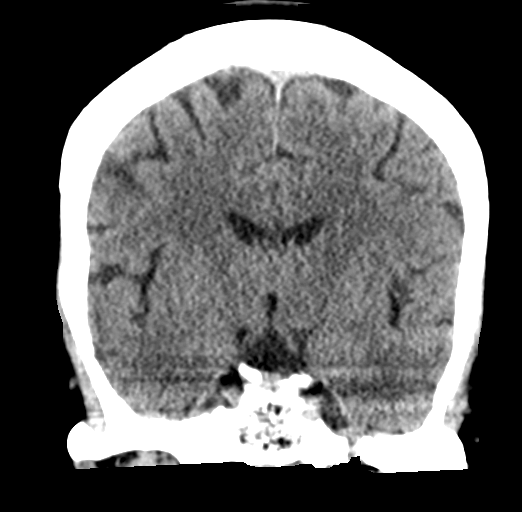

[Series 6: sagittal soft tissue · sagittal · 0.30mm/px · 3 of 48 slices shown]
[im 16/48  brain]
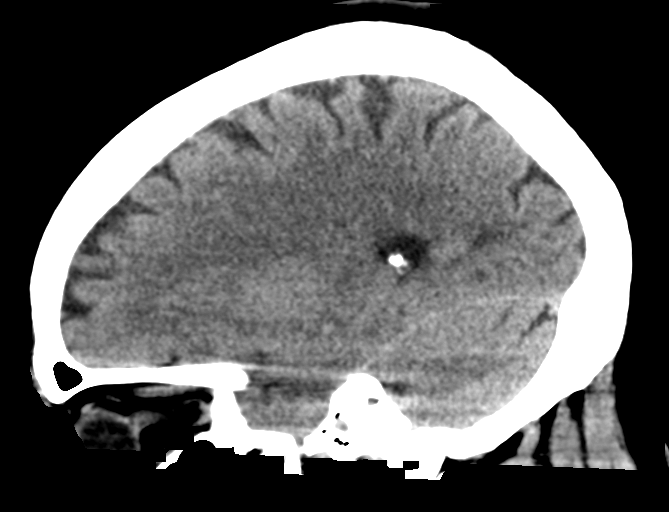
[im 24/48  brain]
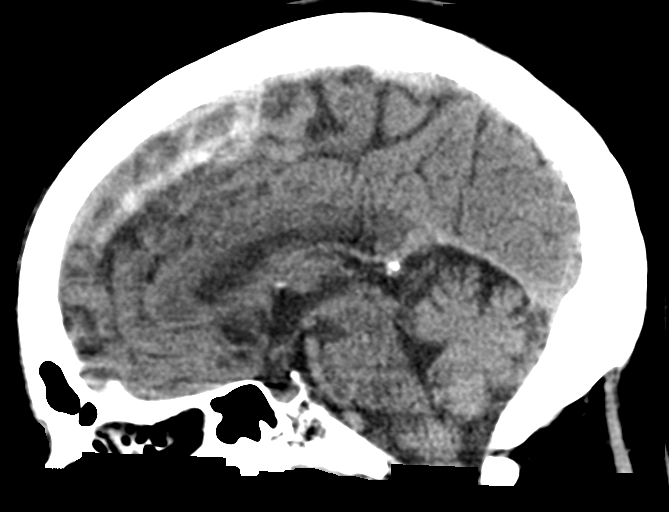
[im 32/48  brain]
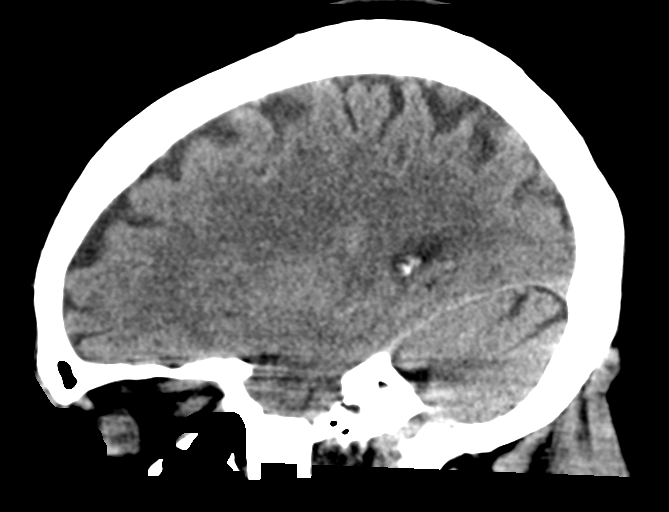

[16 of 47 positions shown; findings below may reference images not displayed]

FINDINGS: Brain: No evidence of acute infarction, hemorrhage, hydrocephalus,
extra-axial collection or mass lesion/mass effect.

Vascular: No hyperdense vessel or unexpected calcification. Carotid
vascular calcification

Skull: Normal. Negative for fracture or focal lesion.

Sinuses/Orbits: Patchy mucosal thickening in the sinuses

Other: None
IMPRESSION: Negative non contrasted CT appearance of the brain

## 2023-05-28 IMAGING — DX DG CHEST 1V PORT
1 series · 1 of 1 positions shown · non-contrast
Comparison: 06/04/2020, chest CT 01/23/2018, chest x-ray 09/09/2018

CLINICAL DATA: Shortness of breath with cough

EXAM:
PORTABLE CHEST 1 VIEW

[chest ap]
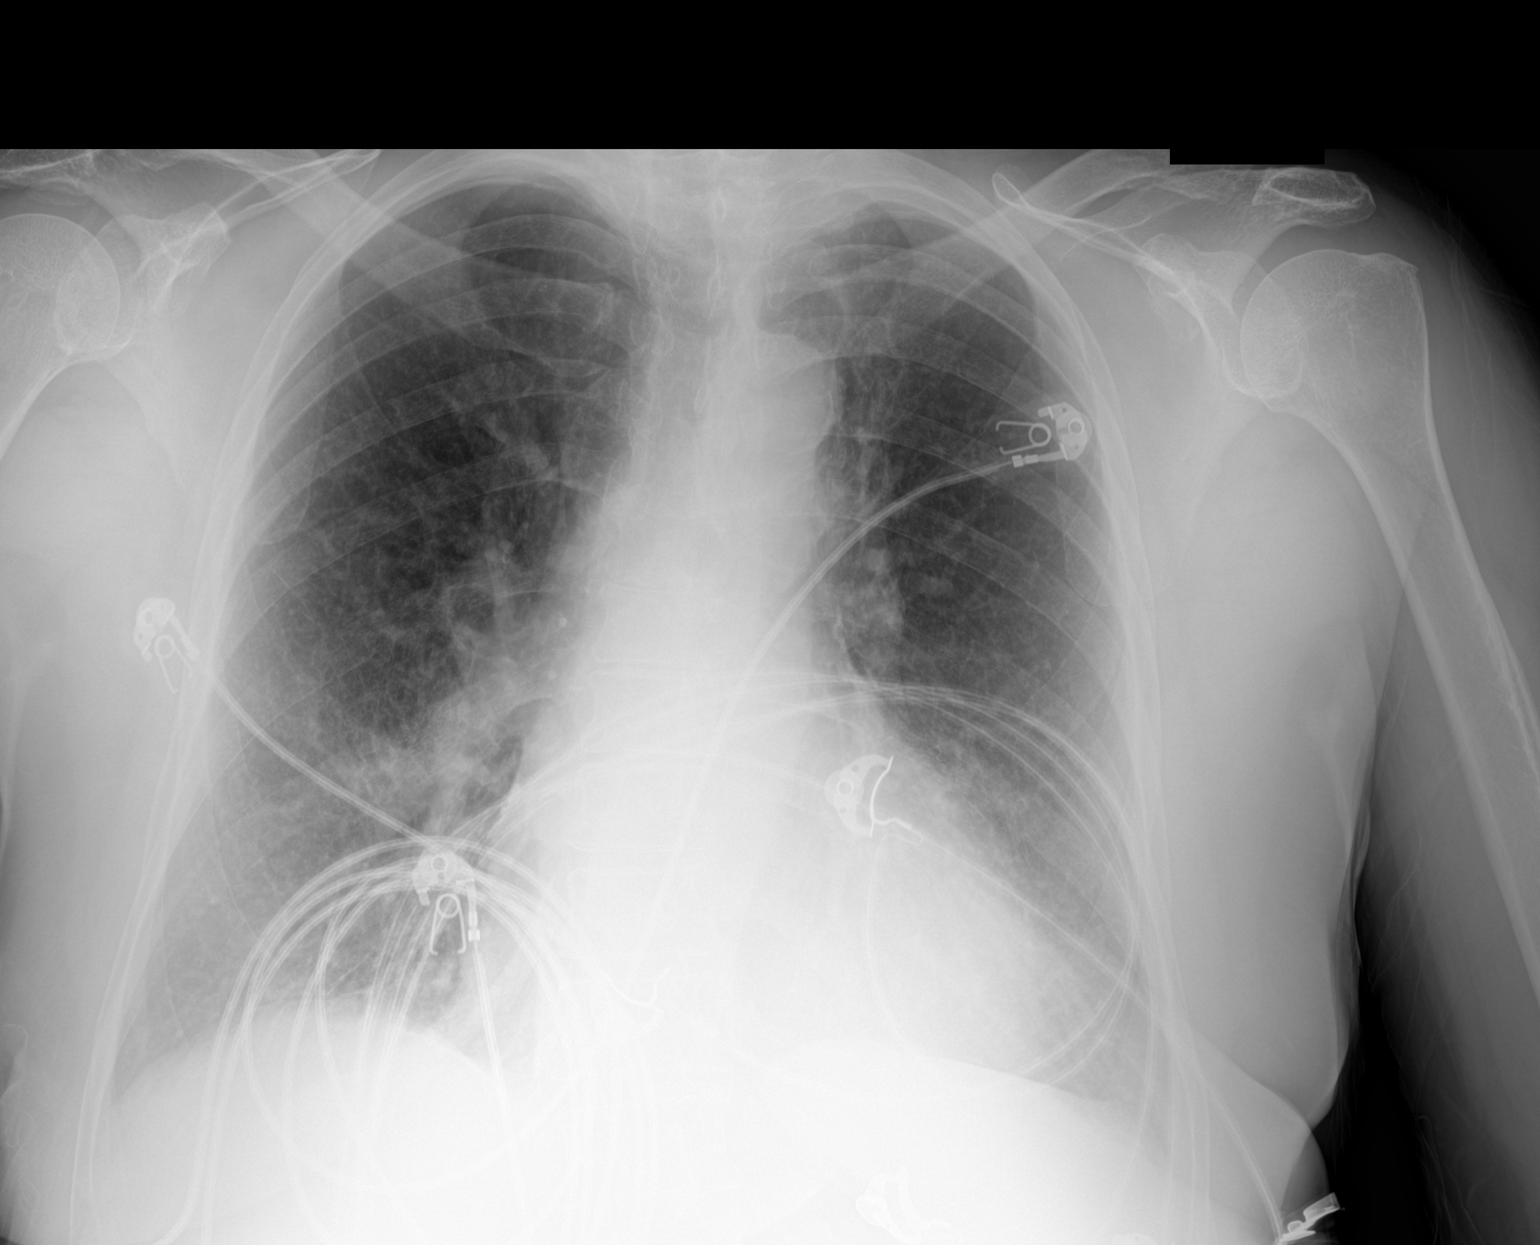

[1 of 1 positions shown; findings below may reference images not displayed]

FINDINGS: Emphysematous disease. Mild diffuse reticular opacity likely due to
chronic disease. No acute confluent airspace disease or pleural
effusion. Stable cardiomediastinal silhouette with aortic
atherosclerosis.
IMPRESSION: No active disease.  Emphysema

## 2023-05-29 ENCOUNTER — Encounter: Payer: Medicare HMO | Admitting: Infectious Diseases

## 2023-05-29 ENCOUNTER — Encounter: Payer: Self-pay | Admitting: *Deleted

## 2023-05-29 DIAGNOSIS — Z170 Estrogen receptor positive status [ER+]: Secondary | ICD-10-CM

## 2023-05-30 ENCOUNTER — Other Ambulatory Visit: Payer: Self-pay

## 2023-05-30 DIAGNOSIS — Z170 Estrogen receptor positive status [ER+]: Secondary | ICD-10-CM | POA: Diagnosis not present

## 2023-05-30 DIAGNOSIS — C50412 Malignant neoplasm of upper-outer quadrant of left female breast: Secondary | ICD-10-CM | POA: Diagnosis not present

## 2023-05-30 DIAGNOSIS — Z510 Encounter for antineoplastic radiation therapy: Secondary | ICD-10-CM | POA: Diagnosis not present

## 2023-05-30 LAB — RAD ONC ARIA SESSION SUMMARY
Course Elapsed Days: 0
Plan Fractions Treated to Date: 1
Plan Prescribed Dose Per Fraction: 5.7 Gy
Plan Total Fractions Prescribed: 5
Plan Total Prescribed Dose: 28.5 Gy
Reference Point Dosage Given to Date: 5.7 Gy
Reference Point Session Dosage Given: 5.7 Gy
Session Number: 1

## 2023-05-31 ENCOUNTER — Telehealth: Payer: Self-pay | Admitting: *Deleted

## 2023-05-31 ENCOUNTER — Inpatient Hospital Stay: Payer: Medicare HMO | Attending: Hematology and Oncology | Admitting: Hematology and Oncology

## 2023-05-31 ENCOUNTER — Ambulatory Visit: Payer: Medicare HMO

## 2023-05-31 DIAGNOSIS — C50412 Malignant neoplasm of upper-outer quadrant of left female breast: Secondary | ICD-10-CM | POA: Diagnosis not present

## 2023-05-31 NOTE — Assessment & Plan Note (Signed)
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  She had no memory of discussion about antiestrogen therapy.  Oncotype DX of 14, no benefit from adjuvant chemotherapy.  Early Stage Breast Cancer Post-surgical patient with no need for chemotherapy. Discussed the benefits of adjuvant hormonal therapy (estrogen blocker) for 5 years to reduce the risk of recurrence.  She is now undergoing adjuvant radiation.  Recommended that she start anastrozole after completing radiation. She expressed understanding.  I will call her back in March to see how she is handling the anastrozole.

## 2023-05-31 NOTE — Telephone Encounter (Addendum)
Received a call from pt asking when will she be receiving Cabenuva injection. I told Dr Ninetta Lights is in the process of working on the details for [place]. She wants to know if she should refill Dovato prescription or wait for Cabenuva injection. I told her I will ask Dr Ninetta Lights.

## 2023-05-31 NOTE — Telephone Encounter (Addendum)
Pt was called / informed to "continue on dovato until the cabenuva is scheduled and she starts her shots" per Dr Ninetta Lights.

## 2023-05-31 NOTE — Progress Notes (Signed)
[facility] CONSULT NOTE  Patient Care Team: [name], MD as PCP - General (Infectious Diseases) Henrietta Hoover, NP (Family Medicine) Glendale Chard, DO as Consulting Physician (Neurology) Pershing Proud, RN as Oncology Nurse Navigator Donnelly Angelica, RN as Oncology Nurse Navigator Almond Lint, MD as Consulting Physician (General Surgery) Rachel Moulds, MD as Consulting Physician (Hematology and Oncology) Dorothy Puffer, MD as Consulting Physician (Radiation Oncology) Wyline Mood, RN as VBCI Care Management  CHIEF COMPLAINTS/PURPOSE OF CONSULTATION:  Newly diagnosed breast cancer  HISTORY OF PRESENTING ILLNESS:  Ruth Stephenson 72 y.o. female is here because of recent diagnosis of left breast cancer  I reviewed her records extensively and collaborated the history with the patient.  SUMMARY OF ONCOLOGIC HISTORY: Oncology History  Primary malignant neoplasm of upper outer quadrant of breast, left (HCC)  03/15/2023 Mammogram   Highly suspicious 1.5 cm UPPER-OUTER LEFT breast mass. Tissue sampling is recommended. Deep LEFT axillary lymph node with borderline cortical thickening. Attempt at tissue sampling is recommended.   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)   03/27/2023 Cancer Staging   Staging form: Breast, AJCC 8th Edition - Clinical stage from 03/27/2023: Stage IA (cT1c, cN0, cM0, G2, ER+, PR+, HER2-) - Signed by Ronny Bacon, PA-C on 03/27/2023 Stage prefix: Initial diagnosis Method of lymph node assessment: Clinical Histologic grading system: 3 grade system    Pathology Results    INVASIVE MODERATELY DIFFERENTIATED DUCTAL ADENOCARCINOMA, GRADE 2 (3+2+1)      DUCTAL CARCINOMA IN SITU, INTERMEDIATE NUCLEAR GRADE, SOLID AND CRIBRIFORM TYPES      WITHOUT NECROSIS      TUBULE FORMATION: SCORE 3      NUCLEAR PLEOMORPHISM: SCORE 2      MITOTIC COUNT: SCORE 1       TOTAL SCORE: 6      OVERALL GRADE GRADE 2 (6/9)      RARE MICROCALCIFICATION PRESENT WITHIN INVASIVE TUMOR      NEGATIVE FOR ANGIOLYMPHATIC INVASION      TUMOR MEASURES 11 MM IN GREATEST LINEAR EXTENT    The tumor cells are negative for Her2 (1+). Estrogen Receptor:  95%, POSITIVE, STRONG STAINING INTENSITY Progesterone Receptor:  30%, POSITIVE, STRONG STAINING INTENSITY Proliferation Marker Ki67:  5%     Genetic Testing   [facility] CancerNext-Expanded Panel+RNA was Negative. 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 Report date is 04/12/2023.  The CancerNext-Expanded gene panel offered by [facility] and includes sequencing, rearrangement, and RNA analysis for the following 71 genes: AIP, ALK, APC, ATM, AXIN2, BAP1, BARD1, BMPR1A, BRCA1, BRCA2, BRIP1, CDC73, CDH1, CDK4, CDKN1B, CDKN2A, CHEK2, CTNNA1, DICER1, FH, FLCN, KIF1B, LZTR1, MAX, MEN1, MET, MLH1, MSH2, MSH3, MSH6, MUTYH, NF1, NF2, NTHL1, PALB2, PHOX2B, PMS2, POT1, PRKAR1A, PTCH1, PTEN, RAD51C, RAD51D, RB1, RET, SDHA, SDHAF2, SDHB, SDHC, SDHD, SMAD4, SMARCA4, SMARCB1, SMARCE1, STK11, SUFU, TMEM127, TP53, TSC1, TSC2, and VHL (sequencing and deletion/duplication); EGFR, EGLN1, HOXB13, KIT, MITF, PDGFRA, POLD1, and POLE (sequencing only); EPCAM and GREM1 (deletion/duplication only).      The patient, with a history of colon cancer and HIV, presents with a recent diagnosis of breast cancer.   Discussed the use of AI scribe software for clinical note transcription with the patient, who gave verbal consent to proceed.  History of Present Illness    This is a follow up telephone visit.  Apparently patient decided to try adjuvant radiation and will  be doing it once a week, will completed on January 3.  She has not picked up the anastrozole yet.  She is feeling well except for some pain in the left arm.  She once again says she is quite overwhelmed by all appointments and recommendations.  MEDICAL  HISTORY:  Past Medical History:  Diagnosis Date   Angio-edema    Breast cancer (HCC)    Cancer (HCC)    Cancer of right colon (HCC) 10/24/2012   Discussed with h/o- no need for further f/u (08-2020)     Cirrhosis of liver (HCC)    had a liver u/s in 2014 that showed that she was cirrhotic.   Hepatitis C    treated (Harvoni) 2015   History of COVID-19 06/03/2020   HIV (human immunodeficiency virus infection) (HCC)    Pneumonia 03/15/2021   in hospital 02-2021 with Pneumococcal pna and non-adherence to meds.   Polysubstance abuse (HCC)    Tuberculosis    neg chest xray    SURGICAL HISTORY: Past Surgical History:  Procedure Laterality Date   BREAST BIOPSY Left 03/22/2023   [place] LT BREAST BX W LOC DEV 1ST LESION IMG BX SPEC US GUIDE 03/22/2023 [facility] MAMMOGRAPHY   BREAST BIOPSY Left 04/10/2023   [place] LT RADIOACTIVE SEED LOC 04/10/2023 [facility] ULTRASOUND   BREAST LUMPECTOMY WITH RADIOACTIVE SEED LOCALIZATION Left 04/11/2023   Procedure: LEFT BREAST SEED LOCALIZED LUMPECTOMY;  Surgeon: Almond Lint, MD;  Location: [facility];  Service: General;  Laterality: Left;   CARPAL TUNNEL RELEASE Left 01/19/2014   Procedure: LEFT CARPAL TUNNEL RELEASE;  Surgeon: Marlowe Shores, MD;  Location: [facility];  Service: Orthopedics;  Laterality: Left;   COLON RESECTION N/A 09/23/2012   Procedure: LAPAROSCOPIC RIGHT COLON RESECTION   ;  Surgeon: Mariella Saa, MD;  Location: [facility];  Service: General;  Laterality: N/A;  LAPOROSCOPY, RIGHT HEMICOLECTOMY   COLON SURGERY  09/23/2012   lap right colectomy   COLONOSCOPY N/A 09/20/2012   Procedure: COLONOSCOPY;  Surgeon: Theda Belfast, MD;  Location: [facility];  Service: Endoscopy;  Laterality: N/A;   OPEN REDUCTION INTERNAL FIXATION (ORIF) DISTAL RADIAL FRACTURE Left 01/19/2014   Procedure: OPEN REDUCTION INTERNAL FIXATION (ORIF) LEFT  DISTAL RADIAL FRACTURE;  Surgeon: Marlowe Shores, MD;  Location: [facility];  Service:  Orthopedics;  Laterality: Left;    SOCIAL HISTORY: Social History   Socioeconomic History   Marital status: Single    Spouse name: Not on file   Number of children: 5   Years of education: Not on file   Highest education level: Not on file  Occupational History   Not on file  Tobacco Use   Smoking status: Former    Current packs/day: 0.50    Average packs/day: 0.5 packs/day for 35.0 years (17.5 ttl pk-yrs)    Types: Cigarettes    Passive exposure: Past   Smokeless tobacco: Former  Building services engineer status: Never Used  Substance and Sexual Activity   Alcohol use: Not Currently   Drug use: Not Currently    Comment: methadone for drug rehab   Sexual activity: Not Currently    Birth control/protection: Post-menopausal    Comment: declined condoms  Other Topics Concern   Not on file  Social History Narrative   ** Merged History Encounter **       Social Determinants of Health   Financial Resource Strain: Medium Risk (10/03/2022)   Overall Financial Resource Strain (CARDIA)  Difficulty of Paying Living Expenses: Somewhat hard  Food Insecurity: No Food Insecurity (04/20/2023)   Hunger Vital Sign    Worried About Running Out of Food in the Last Year: Never true    Ran Out of Food in the Last Year: Never true  Transportation Needs: No Transportation Needs (04/27/2023)   PRAPARE - Administrator, Civil Service (Medical): No    Lack of Transportation (Non-Medical): No  Physical Activity: Sufficiently Active (10/03/2022)   Exercise Vital Sign    Days of Exercise per Week: 7 days    Minutes of Exercise per Session: 40 min  Stress: Stress Concern Present (04/30/2023)   [employer] of Occupational Health - Occupational Stress Questionnaire    Feeling of Stress : Very much  Social Connections: Moderately Isolated (10/03/2022)   Social Connection and Isolation Panel [NHANES]    Frequency of Communication with Friends and Family: More than three times a week     Frequency of Social Gatherings with Friends and Family: Three times a week    Attends Religious Services: More than 4 times per year    Active Member of Clubs or Organizations: No    Attends Banker Meetings: Never    Marital Status: Never married  Intimate Partner Violence: Not At Risk (04/20/2023)   Humiliation, Afraid, Rape, and Kick questionnaire    Fear of Current or Ex-Partner: No    Emotionally Abused: No    Physically Abused: No    Sexually Abused: No    FAMILY HISTORY: Family History  Problem Relation Age of Onset   Angioedema Mother    Cancer Mother 54 - 33       unknown type   Asthma Brother    Throat cancer Maternal Uncle    Angioedema Maternal Grandmother     ALLERGIES:  has No Known Allergies.  MEDICATIONS:  Current Outpatient Medications  Medication Sig Dispense Refill   anastrozole (ARIMIDEX) 1 MG tablet Take 1 tablet (1 mg total) by mouth daily. 90 tablet 3   Cholecalciferol (VITAMIN D3) 50 MCG (2000 UT) capsule Take 2,000 Units by mouth daily.     DOVATO 50-300 MG tablet TAKE 1 TABLET BY MOUTH EVERY DAY 30 tablet 11   GINKGO BILOBA PLUS PO Take 120 mg by mouth daily.     linezolid (ZYVOX) 600 MG tablet Take 600 mg by mouth 2 (two) times daily.     Menatetrenone (VITAMIN K2) 100 MCG TABS Take 100 mg by mouth daily.     methadone (DOLOPHINE) 10 MG/5ML solution Take 23 mg by mouth in the morning.     oxyCODONE (OXY IR/ROXICODONE) 5 MG immediate release tablet Take 1 tablet (5 mg total) by mouth every 6 (six) hours as needed for severe pain (pain score 7-10). 15 tablet 0   No current facility-administered medications for this visit.    REVIEW OF SYSTEMS:   Constitutional: Denies fevers, chills or abnormal night sweats Eyes: Denies blurriness of vision, double vision or watery eyes Ears, nose, mouth, throat, and face: Denies mucositis or sore throat Respiratory: Denies cough, dyspnea or wheezes Cardiovascular: Denies palpitation, chest  discomfort or lower extremity swelling Gastrointestinal:  Denies nausea, heartburn or change in bowel habits Skin: Denies abnormal skin rashes Lymphatics: Denies new lymphadenopathy or easy bruising Neurological:Denies numbness, tingling or new weaknesses Behavioral/Psych: Mood is stable, no new changes  Breast: palpable lump in the left breast upper outer quadrant All other systems were reviewed with the patient and are negative.  PHYSICAL EXAMINATION: ECOG PERFORMANCE STATUS: 0 - Asymptomatic  There were no vitals filed for this visit.  There were no vitals filed for this visit.  PE deferred, telephone visit.  LABORATORY DATA:  I have reviewed the data as listed Lab Results  Component Value Date   WBC 2.2 (L) 04/22/2023   HGB 13.9 04/22/2023   HCT 42.9 04/22/2023   MCV 104.1 (H) 04/22/2023   PLT 131 (L) 04/22/2023   Lab Results  Component Value Date   NA 141 04/20/2023   K 5.0 04/20/2023   CL 106 04/20/2023   CO2 24 04/20/2023    RADIOGRAPHIC STUDIES: I have personally reviewed the radiological reports and agreed with the findings in the report.  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  She had no memory of discussion about antiestrogen therapy.  Oncotype DX of 14, no benefit from adjuvant chemotherapy.  Early Stage Breast Cancer Post-surgical patient with no need for chemotherapy. Discussed the benefits of adjuvant hormonal therapy (estrogen blocker) for 5 years to reduce the risk of recurrence.  She is now undergoing adjuvant radiation.  Recommended that she start anastrozole  after completing radiation. She expressed understanding.  I will call her back in March to see how she is handling the anastrozole.  I connected with  [name] on 05/31/23 by a telephone application and verified that I am speaking with the correct person using two identifiers.   I discussed the limitations of evaluation and management by telemedicine. The patient expressed understanding and agreed to proceed.  Time spent: 5 min  All questions were answered. The patient knows to call the clinic with any problems, questions or concerns.    Rachel Moulds, MD 05/31/23

## 2023-06-01 ENCOUNTER — Ambulatory Visit: Payer: Medicare HMO

## 2023-06-04 ENCOUNTER — Ambulatory Visit: Payer: Medicare HMO

## 2023-06-04 DIAGNOSIS — F1120 Opioid dependence, uncomplicated: Secondary | ICD-10-CM | POA: Diagnosis not present

## 2023-06-05 ENCOUNTER — Encounter: Payer: Self-pay | Admitting: Infectious Diseases

## 2023-06-05 ENCOUNTER — Ambulatory Visit: Payer: Medicare HMO

## 2023-06-05 ENCOUNTER — Ambulatory Visit: Payer: Medicare HMO | Admitting: Infectious Diseases

## 2023-06-05 VITALS — BP 132/74 | HR 66 | Ht 63.0 in | Wt 209.5 lb

## 2023-06-05 DIAGNOSIS — Z85038 Personal history of other malignant neoplasm of large intestine: Secondary | ICD-10-CM

## 2023-06-05 DIAGNOSIS — C50412 Malignant neoplasm of upper-outer quadrant of left female breast: Secondary | ICD-10-CM | POA: Diagnosis not present

## 2023-06-05 DIAGNOSIS — Z6837 Body mass index (BMI) 37.0-37.9, adult: Secondary | ICD-10-CM

## 2023-06-05 DIAGNOSIS — E669 Obesity, unspecified: Secondary | ICD-10-CM | POA: Diagnosis not present

## 2023-06-05 DIAGNOSIS — B20 Human immunodeficiency virus [HIV] disease: Secondary | ICD-10-CM

## 2023-06-05 NOTE — Assessment & Plan Note (Signed)
She is doing well on dovato, although has been off due to miscommunication.  Awaiting pharm f/u for cabaneuva.  Has gotten flu vax Refuses covid.  Will call her when we know more.

## 2023-06-05 NOTE — Assessment & Plan Note (Signed)
Appreciate her oncologist f/u

## 2023-06-05 NOTE — Progress Notes (Signed)
Subjective:    Patient ID: Ruth Stephenson, female  DOB: [birth year], 72 y.o.        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 (Genotype 2014 naive)--> dovato (11-2021).  She also has a hx of Hep C treated (Harvoni) 2015. 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  In 08-2012 she had surgery for colon cancer (II).  She had CT abd 04-2017: Cirrhotic morphology of the liver. Hyperdense focus within the anterior left hepatic lobe ; when clinically feasible, evaluation with liver MRI is suggested, preferably as an outpatient when the patient is able to breath hold.   She was seen in UC by Dr Darrow Bussing [phone] for CP and L 3rd toe fracture. She was started on pepcid (she did not fill, no further CP). She was referred to CV- has been resched.    She has f/u at Methadone clinic. Down to Methadone 23mg . Does not have cravings, wants to start tapering by 1 mg/month.    Her Hep C RNA was (-) (11-2021).    Got section 8 housing. On disability.    She had abn mamogram 02-2023.  She underwent lumpectomy 03-2023:  BREAST, LEFT, LUMPECTOMY:  - Invasive ductal carcinoma, 1.8 cm, grade 2  - Ductal carcinoma in situ: Present, intermediate grade, solid and  cribriform type  ER+PR+ Her2- She has had oncology f/u and has a navigator.  Has gotten (prophylactic) XRT weekly for 5 weeks (completes 06-29-23). States she doesn't need CTX.  Adm with cellulitis R leg 04-13-23. 02-26-23 and 02-20-23 as well.  She had multiple open wounds on her legs. She has one left that has clear liquid d/c from the wound.  Has noted R inguinal LAN from this.  Has vascular f/u 05-02-23.  No oral ulcers. Has felt feverish.  After d/c from hospital she was given po anbx. She completed this (zyvox) yesterday.   Has been off ART for 1 week. Awaiting cabaneuva.   Doesn't want COVID vax.    HIV 1 RNA Quant  Date Value  12/16/2021 Not Detected Copies/mL  11/25/2021 33  Copies/mL (H)  09/16/2021 105 copies/mL (H)   HIV-1 RNA Viral Load (copies/mL)  Date Value  10/03/2022 <20   CD4 T Cell Abs (/uL)  Date Value  04/19/2023 215 (L)  10/03/2022 151 (L)  05/07/2022 143 (L)     Health Maintenance  Topic Date Due  . Medicare Annual Wellness (AWV)  Never done  . COVID-19 Vaccine (1) Never done  . Zoster Vaccines- Shingrix (1 of 2) Never done  . DEXA SCAN  Never done  . Colonoscopy  09/21/2022  . INFLUENZA VACCINE  01/25/2023  . MAMMOGRAM  03/14/2025  . DTaP/Tdap/Td (2 - Td or Tdap) 02/02/2032  . Pneumonia Vaccine 64+ Years old  Completed  . Hepatitis C Screening  Completed  . HPV VACCINES  Aged Out    Review of Systems  Constitutional:  Negative for chills, fever and weight loss.  Respiratory:  Negative for cough and shortness of breath.   Cardiovascular:  Negative for chest pain (has not had CV f/u).  Gastrointestinal:  Negative for constipation and diarrhea.  Genitourinary:  Negative for dysuria.   Please see HPI. All other systems reviewed and negative.     Objective:  Physical Exam Vitals reviewed.  Constitutional:      Appearance: Normal appearance. She is obese.  HENT:  Mouth/Throat:     Mouth: Mucous membranes are moist.     Pharynx: No oropharyngeal exudate.  Eyes:     Extraocular Movements: Extraocular movements intact.     Pupils: Pupils are equal, round, and reactive to light.  Cardiovascular:     Rate and Rhythm: Normal rate and regular rhythm.  Pulmonary:     Effort: Pulmonary effort is normal.  Abdominal:     General: Bowel sounds are normal. There is no distension.     Palpations: Abdomen is soft.     Tenderness: There is no abdominal tenderness.  Musculoskeletal:        General: Normal range of motion.     Cervical back: Normal range of motion and neck supple.     Right lower leg: No edema.     Left lower leg: No edema.  Neurological:     General: No focal deficit present.     Mental Status: She is alert.   Psychiatric:        Mood and Affect: Mood normal.          Assessment & Plan:

## 2023-06-05 NOTE — Assessment & Plan Note (Signed)
She is going to start walking.  I encouraged her.

## 2023-06-05 NOTE — Assessment & Plan Note (Signed)
She appears to be doing well Appreciate h/o f/u.

## 2023-06-06 ENCOUNTER — Other Ambulatory Visit: Payer: Self-pay

## 2023-06-06 ENCOUNTER — Telehealth: Payer: Self-pay

## 2023-06-06 ENCOUNTER — Other Ambulatory Visit (HOSPITAL_COMMUNITY): Payer: Self-pay

## 2023-06-06 ENCOUNTER — Ambulatory Visit
Admission: RE | Admit: 2023-06-06 | Discharge: 2023-06-06 | Disposition: A | Discharge status: Home / Self Care | Payer: Medicare HMO | Source: Ambulatory Visit | Attending: Radiation Oncology | Admitting: Radiation Oncology

## 2023-06-06 ENCOUNTER — Ambulatory Visit: Payer: Medicare HMO

## 2023-06-06 DIAGNOSIS — Z510 Encounter for antineoplastic radiation therapy: Secondary | ICD-10-CM | POA: Diagnosis not present

## 2023-06-06 DIAGNOSIS — C50412 Malignant neoplasm of upper-outer quadrant of left female breast: Secondary | ICD-10-CM | POA: Diagnosis not present

## 2023-06-06 DIAGNOSIS — Z170 Estrogen receptor positive status [ER+]: Secondary | ICD-10-CM | POA: Diagnosis not present

## 2023-06-06 LAB — RAD ONC ARIA SESSION SUMMARY
Course Elapsed Days: 7
Plan Fractions Treated to Date: 2
Plan Prescribed Dose Per Fraction: 5.7 Gy
Plan Total Fractions Prescribed: 5
Plan Total Prescribed Dose: 28.5 Gy
Reference Point Dosage Given to Date: 11.4 Gy
Reference Point Session Dosage Given: 5.7 Gy
Session Number: 2

## 2023-06-06 NOTE — Telephone Encounter (Signed)
Pharmacy Patient Advocate Encounter  Received notification from [facility] that Prior Authorization for CABENUVA has been APPROVED from 06/27/23 to 06/25/24   PA #/Case ID/Reference #: [id number]

## 2023-06-06 NOTE — Telephone Encounter (Signed)
ERROR

## 2023-06-06 NOTE — Telephone Encounter (Signed)
Pharmacy Patient Advocate Encounter   Received notification from Physician's Office that prior authorization for CABENUVA is required/requested.   Insurance verification completed.   The patient is insured through [place] .   PA required; PA submitted to above mentioned insurance via CoverMyMeds Key/confirmation #/EOC [id number]. Status is pending

## 2023-06-07 ENCOUNTER — Ambulatory Visit: Payer: Medicare HMO

## 2023-06-08 ENCOUNTER — Ambulatory Visit: Payer: Medicare HMO

## 2023-06-08 ENCOUNTER — Telehealth: Payer: Self-pay | Admitting: Hematology and Oncology

## 2023-06-11 ENCOUNTER — Ambulatory Visit: Payer: Medicare HMO

## 2023-06-11 DIAGNOSIS — F1120 Opioid dependence, uncomplicated: Secondary | ICD-10-CM | POA: Diagnosis not present

## 2023-06-12 ENCOUNTER — Ambulatory Visit: Payer: Medicare HMO

## 2023-06-13 ENCOUNTER — Ambulatory Visit: Payer: Medicare HMO

## 2023-06-13 ENCOUNTER — Ambulatory Visit
Admission: RE | Admit: 2023-06-13 | Discharge: 2023-06-13 | Disposition: A | Discharge status: Home / Self Care | Payer: Medicare HMO | Source: Ambulatory Visit | Attending: Radiation Oncology | Admitting: Radiation Oncology

## 2023-06-13 ENCOUNTER — Other Ambulatory Visit: Payer: Self-pay

## 2023-06-13 VITALS — Ht 63.0 in | Wt 209.0 lb

## 2023-06-13 DIAGNOSIS — Z0000 Encounter for general adult medical examination without abnormal findings: Secondary | ICD-10-CM

## 2023-06-13 DIAGNOSIS — Z170 Estrogen receptor positive status [ER+]: Secondary | ICD-10-CM | POA: Diagnosis not present

## 2023-06-13 DIAGNOSIS — B20 Human immunodeficiency virus [HIV] disease: Secondary | ICD-10-CM

## 2023-06-13 DIAGNOSIS — Z510 Encounter for antineoplastic radiation therapy: Secondary | ICD-10-CM | POA: Diagnosis not present

## 2023-06-13 DIAGNOSIS — C50412 Malignant neoplasm of upper-outer quadrant of left female breast: Secondary | ICD-10-CM | POA: Diagnosis not present

## 2023-06-13 LAB — RAD ONC ARIA SESSION SUMMARY
Course Elapsed Days: 14
Plan Fractions Treated to Date: 3
Plan Prescribed Dose Per Fraction: 5.7 Gy
Plan Total Fractions Prescribed: 5
Plan Total Prescribed Dose: 28.5 Gy
Reference Point Dosage Given to Date: 17.1 Gy
Reference Point Session Dosage Given: 5.7 Gy
Session Number: 3

## 2023-06-13 NOTE — Progress Notes (Signed)
Subjective:   Ruth Stephenson is a 72 y.o. female who presents for an Initial Medicare Annual Wellness Visit.  Visit Complete: Virtual I connected with  Ruth Stephenson on 06/13/23 by a audio enabled telemedicine application and verified that I am speaking with the correct person using two identifiers.  Patient Location: Home  Provider Location: Home Office  I discussed the limitations of evaluation and management by telemedicine. The patient expressed understanding and agreed to proceed.  Vital Signs: Because this visit was a virtual/telehealth visit, some criteria may be missing or patient reported. Any vitals not documented were not able to be obtained and vitals that have been documented are patient reported.   Cardiac Risk Factors include: advanced age (>11men, >54 women);Other (see comment);obesity (BMI >30kg/m2), Risk factor comments: cirrhosis of liver, neuropathy, AIDS,     Objective:    Today's Vitals   06/13/23 1518  Weight: 209 lb (94.8 kg)  Height: 5\' 3"  (1.6 m)   Body mass index is 37.02 kg/m.     06/13/2023    3:46 PM 05/16/2023    1:11 PM 05/01/2023    2:03 PM 04/20/2023    2:26 AM 04/19/2023    2:57 PM 04/13/2023    5:30 PM 04/11/2023    8:41 AM  Advanced Directives  Does Patient Have a Medical Advance Directive? No No No No No No No  Would patient like information on creating a medical advance directive? No - Patient declined  No - Patient declined No - Patient declined  No - Patient declined No - Patient declined    Current Medications (verified) Outpatient Encounter Medications as of 06/13/2023  Medication Sig   anastrozole (ARIMIDEX) 1 MG tablet Take 1 tablet (1 mg total) by mouth daily.   Cholecalciferol (VITAMIN D3) 50 MCG (2000 UT) capsule Take 2,000 Units by mouth daily.   DOVATO 50-300 MG tablet TAKE 1 TABLET BY MOUTH EVERY DAY   GINKGO BILOBA PLUS PO Take 120 mg by mouth daily.   Menatetrenone (VITAMIN K2) 100 MCG TABS Take 100 mg by mouth  daily.   methadone (DOLOPHINE) 10 MG/5ML solution Take 23 mg by mouth in the morning.   oxyCODONE (OXY IR/ROXICODONE) 5 MG immediate release tablet Take 1 tablet (5 mg total) by mouth every 6 (six) hours as needed for severe pain (pain score 7-10).   No facility-administered encounter medications on file as of 06/13/2023.    Allergies (verified) Patient has no known allergies.   History: Past Medical History:  Diagnosis Date   Angio-edema    Breast cancer (HCC)    Cancer (HCC)    Cancer of right colon (HCC) 10/24/2012   Discussed with h/o- no need for further f/u (08-2020)     Cirrhosis of liver (HCC)    had a liver u/s in 2014 that showed that she was cirrhotic.   Hepatitis C    treated ([facility]) 2015   History of COVID-19 06/03/2020   HIV (human immunodeficiency virus infection) (HCC)    Pneumonia 03/15/2021   in hospital 02-2021 with Pneumococcal pna and non-adherence to meds.   Polysubstance abuse (HCC)    Tuberculosis    neg chest xray   Past Surgical History:  Procedure Laterality Date   BREAST BIOPSY Left 03/22/2023   [place] LT BREAST BX W LOC DEV 1ST LESION IMG BX SPEC US GUIDE 03/22/2023 [facility] MAMMOGRAPHY   BREAST BIOPSY Left 04/10/2023   [place] LT RADIOACTIVE SEED LOC 04/10/2023 [facility] ULTRASOUND   BREAST  LUMPECTOMY WITH RADIOACTIVE SEED LOCALIZATION Left 04/11/2023   Procedure: LEFT BREAST SEED LOCALIZED LUMPECTOMY;  Surgeon: Almond Lint, MD;  Location: [facility];  Service: General;  Laterality: Left;   CARPAL TUNNEL RELEASE Left 01/19/2014   Procedure: LEFT CARPAL TUNNEL RELEASE;  Surgeon: Marlowe Shores, MD;  Location: [facility];  Service: Orthopedics;  Laterality: Left;   COLON RESECTION N/A 09/23/2012   Procedure: LAPAROSCOPIC RIGHT COLON RESECTION   ;  Surgeon: Mariella Saa, MD;  Location: [facility];  Service: General;  Laterality: N/A;  LAPOROSCOPY, RIGHT HEMICOLECTOMY   COLON SURGERY  09/23/2012   lap right colectomy   COLONOSCOPY N/A 09/20/2012    Procedure: COLONOSCOPY;  Surgeon: Theda Belfast, MD;  Location: [facility];  Service: Endoscopy;  Laterality: N/A;   OPEN REDUCTION INTERNAL FIXATION (ORIF) DISTAL RADIAL FRACTURE Left 01/19/2014   Procedure: OPEN REDUCTION INTERNAL FIXATION (ORIF) LEFT  DISTAL RADIAL FRACTURE;  Surgeon: Marlowe Shores, MD;  Location: [facility];  Service: Orthopedics;  Laterality: Left;   Family History  Problem Relation Age of Onset   Angioedema Mother    Cancer Mother 40 - 38       unknown type   Asthma Brother    Throat cancer Maternal Uncle    Angioedema Maternal Grandmother    Social History   Socioeconomic History   Marital status: Single    Spouse name: Not on file   Number of children: 5   Years of education: Not on file   Highest education level: Not on file  Occupational History   Occupation: Retired/disabled  Tobacco Use   Smoking status: Former    Current packs/day: 0.50    Average packs/day: 0.5 packs/day for 35.0 years (17.5 ttl pk-yrs)    Types: Cigarettes    Passive exposure: Past   Smokeless tobacco: Former  Building services engineer status: Never Used  Substance and Sexual Activity   Alcohol use: Not Currently   Drug use: Not Currently    Comment: methadone for drug rehab   Sexual activity: Not Currently    Birth control/protection: Post-menopausal    Comment: declined condoms  Other Topics Concern   Not on file  Social History Narrative   ** Merged History Encounter **    Lives alone.  Grand-kids always are there   Social Drivers of Corporate investment banker Strain: Low Risk  (06/13/2023)   Overall Financial Resource Strain (CARDIA)    Difficulty of Paying Living Expenses: Not hard at all  Food Insecurity: No Food Insecurity (06/13/2023)   Hunger Vital Sign    Worried About Running Out of Food in the Last Year: Never true    Ran Out of Food in the Last Year: Never true  Transportation Needs: No Transportation Needs (06/13/2023)   PRAPARE -  Administrator, Civil Service (Medical): No    Lack of Transportation (Non-Medical): No  Physical Activity: Inactive (06/13/2023)   Exercise Vital Sign    Days of Exercise per Week: 0 days    Minutes of Exercise per Session: 0 min  Stress: Stress Concern Present (06/13/2023)   [employer] of Occupational Health - Occupational Stress Questionnaire    Feeling of Stress : Very much  Social Connections: Moderately Integrated (06/13/2023)   Social Connection and Isolation Panel [NHANES]    Frequency of Communication with Friends and Family: Three times a week    Frequency of Social Gatherings with Friends and Family: Never  Attends Religious Services: More than 4 times per year    Active Member of Clubs or Organizations: Yes    Attends Banker Meetings: Never    Marital Status: Never married    Tobacco Counseling Counseling given: Not Answered   Clinical Intake:  Pre-visit preparation completed: Yes  Pain : No/denies pain     BMI - recorded: 37.02 Nutritional Status: BMI > 30  Obese Nutritional Risks: Nausea/ vomitting/ diarrhea Diabetes: No  How often do you need to have someone help you when you read instructions, pamphlets, or other written materials from your doctor or pharmacy?: 1 - Never  Interpreter Needed?: No  Information entered by :: Dayon Witt, RMA   Activities of Daily Living    06/13/2023    3:42 PM 05/01/2023    2:02 PM  In your present state of health, do you have any difficulty performing the following activities:  Hearing? 0 0  Vision? 0 0  Difficulty concentrating or making decisions? 0 0  Walking or climbing stairs? 0 1  Dressing or bathing? 0 0  Doing errands, shopping? 0 0  Preparing Food and eating ? N   Using the Toilet? N   In the past six months, have you accidently leaked urine? N   Do you have problems with loss of bowel control? N   Managing your Medications? N   Managing your Finances? N    Housekeeping or managing your Housekeeping? N     Patient Care Team: [name], MD as PCP - General (Infectious Diseases) Henrietta Hoover, NP (Family Medicine) Glendale Chard, DO as Consulting Physician (Neurology) Pershing Proud, RN as Oncology Nurse Navigator Donnelly Angelica, RN as Oncology Nurse Navigator Almond Lint, MD as Consulting Physician (General Surgery) Rachel Moulds, MD as Consulting Physician (Hematology and Oncology) Dorothy Puffer, MD as Consulting Physician (Radiation Oncology) Wyline Mood, RN as VBCI Care Management  Indicate any recent Medical Services you may have received from other than [facility] providers in the past year (date may be approximate).     Assessment:   This is a routine wellness examination for Nafeesa.  Hearing/Vision screen Hearing Screening - Comments:: Denies hearing difficulties   Vision Screening - Comments:: Wears eyeglasses   Goals Addressed               This Visit's Progress     Patient Stated (pt-stated)        Not at this time.      Depression Screen    06/13/2023    3:55 PM 05/01/2023    2:02 PM 10/03/2022    2:23 PM 02/01/2022    2:23 PM 11/25/2021    3:09 PM 09/16/2021   11:25 AM 09/02/2021   11:23 AM  PHQ 2/9 Scores  PHQ - 2 Score 2 0 0 0 0 0 2  PHQ- 9 Score 5      16    Fall Risk    06/13/2023    3:47 PM 05/01/2023    2:02 PM 10/03/2022    2:24 PM 02/01/2022    2:22 PM 11/25/2021    3:09 PM  Fall Risk   Falls in the past year? 0 0 0 0 0  Number falls in past yr: 0 0 0 0   Injury with Fall? 0 0 0 0   Risk for fall due to : No Fall Risks No Fall Risks No Fall Risks No Fall Risks No Fall Risks  Follow up  Falls prevention discussed;Falls evaluation completed Falls evaluation completed Falls prevention discussed;Falls evaluation completed Falls evaluation completed;Falls prevention discussed Falls evaluation completed    MEDICARE RISK AT HOME: Medicare Risk at Home Any stairs in or around the  home?: No Home free of loose throw rugs in walkways, pet beds, electrical cords, etc?: Yes (has some throw rugs) Adequate lighting in your home to reduce risk of falls?: Yes Life alert?: No Use of a cane, walker or w/c?: No Grab bars in the bathroom?: Yes Shower chair or bench in shower?: No Elevated toilet seat or a handicapped toilet?: No  TIMED UP AND GO:  Was the test performed? No    Cognitive Function:        06/13/2023    3:49 PM  6CIT Screen  What Year? 0 points  What month? 0 points  What time? 0 points  Count back from 20 0 points  Months in reverse 0 points  Repeat phrase 2 points  Total Score 2 points    Immunizations Immunization History  Administered Date(s) Administered   Fluad Quad(high Dose 65+) 08/26/2021   Influenza,inj,Quad PF,6+ Mos 07/14/2013, 05/27/2014, 08/30/2015, 04/10/2016   Influenza-Unspecified 01/25/2023   PNEUMOCOCCAL CONJUGATE-20 09/16/2021   Pneumococcal Conjugate-13 08/30/2015   Pneumococcal Polysaccharide-23 07/14/2013, 03/24/2021   Tdap 02/01/2022    TDAP status: Up to date  Flu Vaccine status: Up to date  Pneumococcal vaccine status: Up to date  Covid-19 vaccine status: Declined, Education has been provided regarding the importance of this vaccine but patient still declined. Advised may receive this vaccine at local pharmacy or Health Dept.or vaccine clinic. Aware to provide a copy of the vaccination record if obtained from local pharmacy or Health Dept. Verbalized acceptance and understanding.  Qualifies for Shingles Vaccine? Yes   Zostavax completed No   Shingrix Completed?: No.    Education has been provided regarding the importance of this vaccine. Patient has been advised to call insurance company to determine out of pocket expense if they have not yet received this vaccine. Advised may also receive vaccine at local pharmacy or Health Dept. Verbalized acceptance and understanding.  Screening Tests Health Maintenance   Topic Date Due   DEXA SCAN  06/26/2023 (Originally 04/30/2017)   COVID-19 Vaccine (1) 06/29/2023 (Originally 04/30/1956)   Zoster Vaccines- Shingrix (1 of 2) 09/11/2023 (Originally 05/01/1971)   Colonoscopy  06/12/2024 (Originally 09/21/2022)   Medicare Annual Wellness (AWV)  06/12/2024   MAMMOGRAM  03/14/2025   DTaP/Tdap/Td (2 - Td or Tdap) 02/02/2032   Pneumonia Vaccine 72+ Years old  Completed   INFLUENZA VACCINE  Completed   Hepatitis C Screening  Completed   HPV VACCINES  Aged Out    Health Maintenance  There are no preventive care reminders to display for this patient.   Colorectal cancer screening: No longer required.   Mammogram status: Completed 03/15/2023. Repeat every year  Bone Density status: Ordered 06/13/2023. Pt provided with contact info and advised to call to schedule appt.  Lung Cancer Screening: (Low Dose CT Chest recommended if Age 62-80 years, 20 pack-year currently smoking OR have quit w/in 15years.) does not qualify.   Lung Cancer Screening Referral: N/A  Additional Screening:  Hepatitis C Screening: does qualify; Completed 02/26/2023  Vision Screening: Recommended annual ophthalmology exams for early detection of glaucoma and other disorders of the eye. Is the patient up to date with their annual eye exam?  No  Who is the provider or what is the name of the office in which the  patient attends annual eye exams? N/A If pt is not established with a provider, would they like to be referred to a provider to establish care? No .   Dental Screening: Recommended annual dental exams for proper oral hygiene  Community Resource Referral / Chronic Care Management: CRR required this visit?  No   CCM required this visit?  No     Plan:     I have personally reviewed and noted the following in the patient's chart:   Medical and social history Use of alcohol, tobacco or illicit drugs  Current medications and supplements including opioid prescriptions.  Patient is currently taking opioid prescriptions. Information provided to patient regarding non-opioid alternatives. Patient advised to discuss non-opioid treatment plan with their provider. Functional ability and status Nutritional status Physical activity Advanced directives List of other physicians Hospitalizations, surgeries, and ER visits in previous 12 months Vitals Screenings to include cognitive, depression, and falls Referrals and appointments  In addition, I have reviewed and discussed with patient certain preventive protocols, quality metrics, and best practice recommendations. A written personalized care plan for preventive services as well as general preventive health recommendations were provided to patient.     Aylani Spurlock L Taeja Debellis, CMA   06/13/2023   After Visit Summary: (MyChart) Due to this being a telephonic visit, the after visit summary with patients personalized plan was offered to patient via MyChart   Nurse Notes: Patient is due for a DEXA and would like a referral for [facility] imaging.  Patient is up to date on all other health maintenance and had no other concerns to address today.

## 2023-06-13 NOTE — Patient Instructions (Signed)
[name] , Thank you for taking time to come for your Medicare Wellness Visit. I appreciate your ongoing commitment to your health goals. Please review the following plan we discussed and let me know if I can assist you in the future.   Referrals/Orders/Follow-Ups/Clinician Recommendations: You are due for a Bone Density screening.  It was a pleasure talking to you today and I wish you a very Merry Christmas.    You have an order for:   [x]   Bone Density     Please call for appointment:  [facility] [address] [place], [place] [address] [phone]   Make sure to wear two-piece clothing.  No lotions, powders, or deodorants the day of the appointment. Make sure to bring picture ID and insurance card.  Bring list of medications you are currently taking including any supplements.   Schedule your [place] screening mammogram through MyChart!   Log into your MyChart account.  Go to 'Visit' (or 'Appointments' if on mobile App) --> Schedule an Appointment  Under 'Select a Reason for Visit' choose the Mammogram Screening option.  Complete the pre-visit questions and select the time and place that best fits your schedule.    This is a list of the screening recommended for you and due dates:  Health Maintenance  Topic Date Due   DEXA scan (bone density measurement)  06/26/2023*   COVID-19 Vaccine (1) 06/29/2023*   Zoster (Shingles) Vaccine (1 of 2) 09/11/2023*   Colon Cancer Screening  06/12/2024*   Medicare Annual Wellness Visit  06/12/2024   Mammogram  03/14/2025   DTaP/Tdap/Td vaccine (2 - Td or Tdap) 02/02/2032   Pneumonia Vaccine  Completed   Flu Shot  Completed   Hepatitis C Screening  Completed   HPV Vaccine  Aged Out  *Topic was postponed. The date shown is not the original due date.    Advanced directives: (Declined) Advance directive discussed with you today. Even though you declined this today, please call our office should you  change your mind, and we can give you the proper paperwork for you to fill out.  Next Medicare Annual Wellness Visit scheduled for next year: Yes

## 2023-06-14 ENCOUNTER — Ambulatory Visit: Payer: Medicare HMO

## 2023-06-14 NOTE — Addendum Note (Signed)
Addended by: Aggie Douse C on: 06/14/2023 09:33 AM   Modules accepted: Orders

## 2023-06-15 ENCOUNTER — Ambulatory Visit: Payer: Medicare HMO

## 2023-06-18 ENCOUNTER — Ambulatory Visit: Payer: Medicare HMO

## 2023-06-18 DIAGNOSIS — F1120 Opioid dependence, uncomplicated: Secondary | ICD-10-CM | POA: Diagnosis not present

## 2023-06-19 ENCOUNTER — Ambulatory Visit: Payer: Medicare HMO

## 2023-06-21 ENCOUNTER — Ambulatory Visit: Payer: Medicare HMO

## 2023-06-21 ENCOUNTER — Ambulatory Visit
Admission: RE | Admit: 2023-06-21 | Discharge: 2023-06-21 | Disposition: A | Discharge status: Home / Self Care | Payer: Medicare HMO | Source: Ambulatory Visit | Attending: Radiation Oncology | Admitting: Radiation Oncology

## 2023-06-21 ENCOUNTER — Other Ambulatory Visit: Payer: Self-pay

## 2023-06-21 DIAGNOSIS — Z170 Estrogen receptor positive status [ER+]: Secondary | ICD-10-CM | POA: Diagnosis not present

## 2023-06-21 DIAGNOSIS — Z510 Encounter for antineoplastic radiation therapy: Secondary | ICD-10-CM | POA: Diagnosis not present

## 2023-06-21 DIAGNOSIS — C50412 Malignant neoplasm of upper-outer quadrant of left female breast: Secondary | ICD-10-CM | POA: Diagnosis not present

## 2023-06-21 LAB — RAD ONC ARIA SESSION SUMMARY
Course Elapsed Days: 22
Plan Fractions Treated to Date: 4
Plan Prescribed Dose Per Fraction: 5.7 Gy
Plan Total Fractions Prescribed: 5
Plan Total Prescribed Dose: 28.5 Gy
Reference Point Dosage Given to Date: 22.8 Gy
Reference Point Session Dosage Given: 5.7 Gy
Session Number: 4

## 2023-06-22 ENCOUNTER — Ambulatory Visit: Payer: Medicare HMO

## 2023-06-25 ENCOUNTER — Ambulatory Visit: Payer: Medicare HMO

## 2023-06-25 DIAGNOSIS — F1120 Opioid dependence, uncomplicated: Secondary | ICD-10-CM | POA: Diagnosis not present

## 2023-06-26 ENCOUNTER — Ambulatory Visit: Payer: Medicare HMO

## 2023-06-28 ENCOUNTER — Ambulatory Visit
Admission: RE | Admit: 2023-06-28 | Discharge: 2023-06-28 | Disposition: A | Discharge status: Home / Self Care | Payer: Medicare HMO | Source: Ambulatory Visit | Attending: Radiation Oncology | Admitting: Radiation Oncology

## 2023-06-28 ENCOUNTER — Ambulatory Visit: Payer: Medicare HMO

## 2023-06-28 ENCOUNTER — Other Ambulatory Visit: Payer: Self-pay

## 2023-06-28 DIAGNOSIS — Z170 Estrogen receptor positive status [ER+]: Secondary | ICD-10-CM | POA: Diagnosis not present

## 2023-06-28 DIAGNOSIS — C50412 Malignant neoplasm of upper-outer quadrant of left female breast: Secondary | ICD-10-CM

## 2023-06-28 DIAGNOSIS — Z510 Encounter for antineoplastic radiation therapy: Secondary | ICD-10-CM | POA: Diagnosis not present

## 2023-06-28 LAB — RAD ONC ARIA SESSION SUMMARY
Course Elapsed Days: 29
Plan Fractions Treated to Date: 5
Plan Prescribed Dose Per Fraction: 5.7 Gy
Plan Total Fractions Prescribed: 5
Plan Total Prescribed Dose: 28.5 Gy
Reference Point Dosage Given to Date: 28.5 Gy
Reference Point Session Dosage Given: 5.7 Gy
Session Number: 5

## 2023-06-28 MED ORDER — SONAFINE EX EMUL
1.0000 | Freq: Two times a day (BID) | CUTANEOUS | Status: DC
Start: 2023-06-28 — End: 2023-06-29
  Administered 2023-06-28: 1 via TOPICAL

## 2023-06-29 ENCOUNTER — Ambulatory Visit: Payer: Medicare HMO

## 2023-06-29 NOTE — Radiation Completion Notes (Addendum)
  Radiation Oncology         [phone] ________________________________  Name: Ruth Stephenson MRN: [id number]  Date of Service: 06/28/2023  DOB: [birth year]  End of Treatment Note   Diagnosis: Stage IA, pT1cN0M0, grade 2, ER/PR positive invasive ductal carcinoma of the left breast.   Intent: Curative     ==========DELIVERED PLANS==========  First Treatment Date: 2023-05-30 Last Treatment Date: 2023-06-28   Plan Name: Breast_L_BH Site: Breast, Left Technique: 3D Mode: Photon Dose Per Fraction: 5.7 Gy Prescribed Dose (Delivered / Prescribed): 28.5 Gy / 28.5 Gy Prescribed Fxs (Delivered / Prescribed): 5 / 5     ==========ON TREATMENT VISIT DATES========== 2023-06-28 See weekly On Treatment Notes in Epic for details in the Media tab (listed as Progress notes on the On Treatment Visit Dates listed above). The patient tolerated radiation. She developed fatigue and anticipated skin changes in the treatment field.   The patient will receive a call in about one month from the radiation oncology department. She will continue follow up with Dr. Loretha as well.      Donald KYM Husband, PAC

## 2023-07-02 ENCOUNTER — Ambulatory Visit: Payer: Medicare HMO

## 2023-07-02 DIAGNOSIS — F1120 Opioid dependence, uncomplicated: Secondary | ICD-10-CM | POA: Diagnosis not present

## 2023-07-03 ENCOUNTER — Ambulatory Visit: Payer: Medicare HMO

## 2023-07-04 ENCOUNTER — Ambulatory Visit: Payer: Medicare HMO

## 2023-07-04 ENCOUNTER — Inpatient Hospital Stay: Payer: Medicare HMO | Attending: Hematology and Oncology | Admitting: Hematology and Oncology

## 2023-07-05 ENCOUNTER — Ambulatory Visit: Payer: Medicare HMO

## 2023-07-06 ENCOUNTER — Ambulatory Visit: Payer: Medicare HMO

## 2023-07-09 ENCOUNTER — Ambulatory Visit: Payer: Medicare HMO

## 2023-07-09 DIAGNOSIS — F1120 Opioid dependence, uncomplicated: Secondary | ICD-10-CM | POA: Diagnosis not present

## 2023-07-10 ENCOUNTER — Ambulatory Visit: Payer: Medicare HMO

## 2023-07-11 ENCOUNTER — Ambulatory Visit: Payer: Medicare HMO

## 2023-07-12 ENCOUNTER — Ambulatory Visit: Payer: Medicare HMO

## 2023-07-13 ENCOUNTER — Ambulatory Visit: Payer: Medicare HMO

## 2023-07-16 ENCOUNTER — Ambulatory Visit: Payer: Medicare HMO

## 2023-07-16 DIAGNOSIS — F1120 Opioid dependence, uncomplicated: Secondary | ICD-10-CM | POA: Diagnosis not present

## 2023-07-17 ENCOUNTER — Ambulatory Visit: Payer: Medicare HMO

## 2023-07-17 ENCOUNTER — Other Ambulatory Visit (HOSPITAL_COMMUNITY): Payer: Self-pay

## 2023-07-18 ENCOUNTER — Encounter: Payer: Medicare HMO | Admitting: Infectious Diseases

## 2023-07-19 ENCOUNTER — Other Ambulatory Visit: Payer: Self-pay

## 2023-07-19 DIAGNOSIS — R600 Localized edema: Secondary | ICD-10-CM

## 2023-07-30 ENCOUNTER — Ambulatory Visit
Admission: RE | Admit: 2023-07-30 | Discharge: 2023-07-30 | Disposition: A | Discharge status: Home / Self Care | Payer: Medicare HMO | Source: Ambulatory Visit | Attending: Radiation Oncology | Admitting: Radiation Oncology

## 2023-07-30 NOTE — Progress Notes (Signed)
  Radiation Oncology         [phone] ________________________________  Name: Ruth Stephenson MRN: [id number]  Date of Service: 07/30/2023  DOB: 16-Mar-1951  Post Treatment Telephone Note  Diagnosis:  Stage IA, pT1cN0M0, grade 2, ER/PR positive invasive ductal carcinoma of the left breast. (as documented in provider EOT note)  The patient was available for call today.   Symptoms of fatigue have improved since completing therapy.  Symptoms of skin changes have improved since completing therapy.  The patient was encouraged to avoid sun exposure in the area of prior treatment for up to one year following radiation with either sunscreen or by the style of clothing worn in the sun.  The patient has scheduled follow up with her medical oncologist Dr. Al Pimple for ongoing surveillance, and was encouraged to call if she develops concerns or questions regarding radiation.   This concludes the interaction.  Ruel Favors, LPN

## 2023-08-02 ENCOUNTER — Ambulatory Visit (HOSPITAL_COMMUNITY)
Admission: RE | Admit: 2023-08-02 | Discharge: 2023-08-02 | Disposition: A | Discharge status: Home / Self Care | Payer: 59 | Source: Ambulatory Visit | Attending: Vascular Surgery | Admitting: Vascular Surgery

## 2023-08-02 ENCOUNTER — Encounter (HOSPITAL_COMMUNITY): Payer: Self-pay

## 2023-08-02 ENCOUNTER — Ambulatory Visit (INDEPENDENT_AMBULATORY_CARE_PROVIDER_SITE_OTHER): Payer: 59

## 2023-08-02 VITALS — BP 143/76 | HR 51 | Temp 98.0°F | Ht 63.0 in | Wt 201.5 lb

## 2023-08-02 DIAGNOSIS — I872 Venous insufficiency (chronic) (peripheral): Secondary | ICD-10-CM | POA: Diagnosis not present

## 2023-08-02 DIAGNOSIS — M7989 Other specified soft tissue disorders: Secondary | ICD-10-CM

## 2023-08-02 DIAGNOSIS — R600 Localized edema: Secondary | ICD-10-CM

## 2023-08-02 NOTE — Progress Notes (Signed)
 [facility]           OF   History and Physical   Ruth Stephenson is a 73 y.o. female who presents with BLE swelling.    She states that she does not work but stays active chasing her grand kids.  She states that she has pressure in her legs and they feel tight if she has been up for quite some time.  She states that her balance and equilibrium feel off as well.  She has never had a DVT.  She states her grandmother did have leg swelling.  She has never had any venous procedures.  She does have skin color changes of both lower legs.  She does get occasional cramps in her calves.  She quit smoking about 4 years ago.  She does wear compression but states it makes it worse with the swelling above the top of the sock.    She does have hx of HIV, breast cancer.    The pt is not on a statin for cholesterol management.  The pt is not on a daily aspirin.   Other AC:  none The pt is not on medication for hypertension.   The pt is not on medication for diabetes.   Tobacco hx:  former  Pt does not have family hx of AAA.  Past Medical History:  Diagnosis Date   Angio-edema    Breast cancer (HCC)    Cancer (HCC)    Cancer of right colon (HCC) 10/24/2012   Discussed with h/o- no need for further f/u (08-2020)     Cirrhosis of liver (HCC)    had a liver u/s in 2014 that showed that she was cirrhotic.   Hepatitis C    treated ([facility] ) 2015   History of COVID-19 06/03/2020   HIV (human immunodeficiency virus infection) (HCC)    Pneumonia 03/15/2021   in hospital 02-2021 with Pneumococcal pna and non-adherence to meds.   Polysubstance abuse (HCC)    Tuberculosis    neg chest xray    Past Surgical History:  Procedure Laterality Date   BREAST BIOPSY Left 03/22/2023   US  LT BREAST BX W LOC DEV 1ST LESION IMG BX SPEC US  GUIDE 03/22/2023 [facility] MAMMOGRAPHY   BREAST BIOPSY Left 04/10/2023   US  LT RADIOACTIVE SEED LOC 04/10/2023 [facility] ULTRASOUND   BREAST  LUMPECTOMY WITH RADIOACTIVE SEED LOCALIZATION Left 04/11/2023   Procedure: LEFT BREAST SEED LOCALIZED LUMPECTOMY;  Surgeon: Aron Shoulders, MD;  Location: [facility];  Service: General;  Laterality: Left;   CARPAL TUNNEL RELEASE Left 01/19/2014   Procedure: LEFT CARPAL TUNNEL RELEASE;  Surgeon: Donnice DELENA Robinsons, MD;  Location: [facility];  Service: Orthopedics;  Laterality: Left;   COLON RESECTION N/A 09/23/2012   Procedure: LAPAROSCOPIC RIGHT COLON RESECTION   ;  Surgeon: Morene ONEIDA Olives, MD;  Location: [facility];  Service: General;  Laterality: N/A;  LAPOROSCOPY, RIGHT HEMICOLECTOMY   COLON SURGERY  09/23/2012   lap right colectomy   COLONOSCOPY N/A 09/20/2012   Procedure: COLONOSCOPY;  Surgeon: Belvie JONETTA Just, MD;  Location: [facility];  Service: Endoscopy;  Laterality: N/A;   OPEN REDUCTION INTERNAL FIXATION (ORIF) DISTAL RADIAL FRACTURE Left 01/19/2014   Procedure: OPEN REDUCTION INTERNAL FIXATION (ORIF) LEFT  DISTAL RADIAL FRACTURE;  Surgeon: Donnice DELENA Robinsons, MD;  Location: [facility];  Service: Orthopedics;  Laterality: Left;    Social History   Socioeconomic History  Marital status: Single    Spouse name: Not on file   Number of children: 5   Years of education: Not on file   Highest education level: Not on file  Occupational History   Occupation: Retired/disabled  Tobacco Use   Smoking status: Former    Current packs/day: 0.50    Average packs/day: 0.5 packs/day for 35.0 years (17.5 ttl pk-yrs)    Types: Cigarettes    Passive exposure: Past   Smokeless tobacco: Former  Building Services Engineer status: Never Used  Substance and Sexual Activity   Alcohol  use: Not Currently   Drug use: Not Currently    Comment: methadone  for drug rehab   Sexual activity: Not Currently    Birth control/protection: Post-menopausal    Comment: declined condoms  Other Topics Concern   Not on file  Social History Narrative   ** Merged History Encounter **    Lives  alone.  Grand-kids always are there   Social Drivers of Corporate Investment Banker Strain: Low Risk  (06/13/2023)   Overall Financial Resource Strain (CARDIA)    Difficulty of Paying Living Expenses: Not hard at all  Food Insecurity: No Food Insecurity (06/13/2023)   Hunger Vital Sign    Worried About Running Out of Food in the Last Year: Never true    Ran Out of Food in the Last Year: Never true  Transportation Needs: No Transportation Needs (06/13/2023)   PRAPARE - Administrator, Civil Service (Medical): No    Lack of Transportation (Non-Medical): No  Physical Activity: Inactive (06/13/2023)   Exercise Vital Sign    Days of Exercise per Week: 0 days    Minutes of Exercise per Session: 0 min  Stress: Stress Concern Present (06/13/2023)   [employer] of Occupational Health - Occupational Stress Questionnaire    Feeling of Stress : Very much  Social Connections: Moderately Integrated (06/13/2023)   Social Connection and Isolation Panel [NHANES]    Frequency of Communication with Friends and Family: Three times a week    Frequency of Social Gatherings with Friends and Family: Never    Attends Religious Services: More than 4 times per year    Active Member of [employer] or Organizations: Yes    Attends Banker Meetings: Never    Marital Status: Never married  Intimate Partner Violence: Patient Unable To Answer (06/13/2023)   Humiliation, Afraid, Rape, and Kick questionnaire    Fear of Current or Ex-Partner: Patient unable to answer    Emotionally Abused: Patient unable to answer    Physically Abused: Patient unable to answer    Sexually Abused: Patient unable to answer     Family History  Problem Relation Age of Onset   Angioedema Mother    Cancer Mother 57 - 70       unknown type   Asthma Brother    Throat cancer Maternal Uncle    Angioedema Maternal Grandmother     Current Outpatient Medications  Medication Sig Dispense Refill   anastrozole   (ARIMIDEX ) 1 MG tablet Take 1 tablet (1 mg total) by mouth daily. 90 tablet 3   Cholecalciferol  (VITAMIN D3) 50 MCG (2000 UT) capsule Take 2,000 Units by mouth daily.     DOVATO  50-300 MG tablet TAKE 1 TABLET BY MOUTH EVERY DAY 30 tablet 11   GINKGO BILOBA PLUS PO Take 120 mg by mouth daily.     Menatetrenone (VITAMIN K2) 100 MCG TABS Take 100 mg by mouth  daily.     methadone  (DOLOPHINE ) 10 MG/5ML solution Take 23 mg by mouth in the morning.     oxyCODONE  (OXY IR/ROXICODONE ) 5 MG immediate release tablet Take 1 tablet (5 mg total) by mouth every 6 (six) hours as needed for severe pain (pain score 7-10). 15 tablet 0   No current facility-administered medications for this visit.    No Known Allergies  REVIEW OF SYSTEMS:   [X]  denotes positive finding, [ ]  denotes negative finding Cardiac  Comments:  Chest pain or chest pressure:    Shortness of breath upon exertion:    Short of breath when lying flat:    Irregular heart rhythm:        Vascular    Pain in calf, thigh, or hip brought on by ambulation:    Pain in feet at night that wakes you up from your sleep:     Blood clot in your veins:    Leg swelling:  x       Pulmonary    Oxygen at home:    Productive cough:     Wheezing:         Neurologic    Sudden weakness in arms or legs:     Sudden numbness in arms or legs:     Sudden onset of difficulty speaking or slurred speech:    Temporary loss of vision in one eye:     Problems with dizziness:         Gastrointestinal    Blood in stool:     Vomited blood:         Genitourinary    Burning when urinating:     Blood in urine:        Psychiatric    Major depression:         Hematologic    Bleeding problems:    Problems with blood clotting too easily:        Skin    Rashes or ulcers:        Constitutional    Fever or chills:      PHYSICAL EXAMINATION:  Today's Vitals   08/02/23 1320 08/02/23 1322  BP: (!) 143/76   Pulse: (!) 51   Temp: 98 F (36.7 C)    SpO2: 94%   Weight: 201 lb 8 oz (91.4 kg)   Height: 5' 3 (1.6 m)   PainSc: 9  9   PainLoc: Foot    Body mass index is 35.69 kg/m.   General:  WDWN in NAD; vital signs documented above Gait: Not observed HENT: WNL, normocephalic Pulmonary: normal non-labored breathing without wheezing Cardiac: regular HR; without carotid bruits Abdomen: soft, NT, aortic pulse is not palpable Skin: without rashes Vascular Exam/Pulses:  Right Left  Radial 2+ (normal) 2+ (normal)  DP 1+ (weak) 1+ (weak)   Extremities: bilateral leg swelling with hemosiderin staining   Neurologic: A&O X 3;  moving all extremities equally Psychiatric:  The pt has Normal affect.   Non-Invasive Vascular Imaging:   Venous duplex on 05/04/2023: +--------------+---------+------+-----------+------------+--------+  RIGHT        Reflux NoRefluxReflux TimeDiameter cmsComments                          Yes                                   +--------------+---------+------+-----------+------------+--------+  CFV  yes   >1 second                       +--------------+---------+------+-----------+------------+--------+  FV mid        no                                              +--------------+---------+------+-----------+------------+--------+  Popliteal    no                                              +--------------+---------+------+-----------+------------+--------+  GSV at SFJ              yes    >500 ms      0.95              +--------------+---------+------+-----------+------------+--------+  GSV prox thigh          yes    >500 ms      0.57              +--------------+---------+------+-----------+------------+--------+  GSV mid thigh           yes    >500 ms      0.61              +--------------+---------+------+-----------+------------+--------+  GSV dist thigh          yes    >500 ms      0.64               +--------------+---------+------+-----------+------------+--------+  GSV at knee             yes    >500 ms      0.57              +--------------+---------+------+-----------+------------+--------+  GSV prox calf no                            0.32              +--------------+---------+------+-----------+------------+--------+  SSV Pop Fossa no                            0.34              +--------------+---------+------+-----------+------------+--------+  SSV prox calf no                            0.37              +--------------+---------+------+-----------+------------+--------+   Summary:  Right:  - No evidence of deep vein thrombosis seen in the right lower extremity, from the common femoral through the popliteal veins.  - No evidence of superficial venous thrombosis in the right lower extremity.    - The deep venous system is incompetent at the common femoral vein.  - The great saphenous vein is incompetent.  - The small saphenous vein is competent.     Ruth Stephenson is a 72 y.o. female who presents with: BLE swelling     -pt has 1+ palpable DP pedal pulses -pt does not have evidence of DVT.  Pt does have venous reflux in the right deep venous system  as well as the GSV at the [facility] and throughout the GSV down to the knee.   -discussed with pt about wearing thigh high 20-30 mmHg compression stockings and pt was measured for these today.    -discussed the importance of leg elevation and how to elevate properly - pt is advised to elevate their legs and a diagram is given to them to demonstrate for pt to lay flat on their back with knees elevated and slightly bent with their feet higher than their knees, which puts their feet higher than their heart for 15 minutes per day.  If pt cannot lay flat, advised to lay as flat as possible.  -pt is advised to continue as much walking as possible and avoid sitting or standing for long periods of time.  -discussed  importance of weight loss and exercise and that water  aerobics would also be beneficial.  -handout with recommendations given -pt will f/u in a few months with vein MD for further discussions.  I discussed with her that this will not fix her balance/equilibrium issues and that she should be evaluated by her PCP for these issues.  Also discussed that if she did the recommendations and her legs were feeling better, she could cancel appt with MD.  She expressed understanding of all the above.    Lucie Apt, [facility] [phone]  Clinic MD:  [place]

## 2023-09-06 ENCOUNTER — Inpatient Hospital Stay: Payer: 59 | Attending: Hematology and Oncology | Admitting: Adult Health

## 2023-09-06 NOTE — Progress Notes (Deleted)
 Pt called to discuss missed appt and what was the visit about. Advised pt that appt is for a f/u after starting hormone therapy after radiation. Pt stated that she didn't know that she was suppose to take any medication and that she thought that the cancer was gone. Educated pt on the importance of taking hormone therapy after chemotherapy/radiation. Pt verbalized understanding and advised to call her pharmacy and begin taking medication and f/u as scheduled.

## 2023-09-06 NOTE — Progress Notes (Deleted)
 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 Tissue sampling is recommended. Deep LEFT axillary lymph node with borderline cortical thickening. Attempt at tissue sampling is recommended.   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)   03/27/2023 Cancer Staging   Staging form: Breast, AJCC 8th Edition - Clinical stage from 03/27/2023: Stage IA (cT1c, cN0, cM0, G2, ER+, PR+, HER2-) - Signed by Ronny Bacon, PA-C on 03/27/2023 Stage prefix: Initial diagnosis Method of lymph node assessment: Clinical Histologic grading system: 3 grade system    Pathology Results    INVASIVE MODERATELY DIFFERENTIATED DUCTAL ADENOCARCINOMA, GRADE 2 (3+2+1)      DUCTAL CARCINOMA IN SITU, INTERMEDIATE NUCLEAR GRADE, SOLID AND CRIBRIFORM TYPES      WITHOUT NECROSIS      TUBULE FORMATION: SCORE 3      NUCLEAR PLEOMORPHISM: SCORE 2      MITOTIC COUNT: SCORE 1      TOTAL SCORE: 6      OVERALL GRADE GRADE 2 (6/9)      RARE MICROCALCIFICATION PRESENT WITHIN INVASIVE TUMOR      NEGATIVE FOR ANGIOLYMPHATIC INVASION      TUMOR MEASURES 11 MM IN GREATEST LINEAR EXTENT    The tumor cells are negative for Her2 (1+). Estrogen Receptor:  95%, POSITIVE, STRONG STAINING INTENSITY Progesterone Receptor:  30%, POSITIVE, STRONG STAINING INTENSITY Proliferation Marker Ki67:  5%     Genetic Testing   [facility] CancerNext-Expanded Panel+RNA was Negative. 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 Report date is 04/12/2023.  The CancerNext-Expanded gene panel offered by [facility] and includes sequencing, rearrangement, and RNA analysis for the following 71 genes: AIP, ALK, APC, ATM, AXIN2, BAP1, BARD1, BMPR1A, BRCA1, BRCA2, BRIP1, CDC73, CDH1,  CDK4, CDKN1B, CDKN2A, CHEK2, CTNNA1, DICER1, FH, FLCN, KIF1B, LZTR1, MAX, MEN1, MET, MLH1, MSH2, MSH3, MSH6, MUTYH, NF1, NF2, NTHL1, PALB2, PHOX2B, PMS2, POT1, PRKAR1A, PTCH1, PTEN, RAD51C, RAD51D, RB1, RET, SDHA, SDHAF2, SDHB, SDHC, SDHD, SMAD4, SMARCA4, SMARCB1, SMARCE1, STK11, SUFU, TMEM127, TP53, TSC1, TSC2, and VHL (sequencing and deletion/duplication); EGFR, EGLN1, HOXB13, KIT, MITF, PDGFRA, POLD1, and POLE (sequencing only); EPCAM and GREM1 (deletion/duplication only).     05/30/2023 - 06/28/2023 Radiation Therapy   Plan Name: Breast_L_BH Site: Breast, Left Technique: 3D Mode: Photon Dose Per Fraction: 5.7 Gy Prescribed Dose (Delivered / Prescribed): 28.5 Gy / 28.5 Gy Prescribed Fxs (Delivered / Prescribed): 5 / 5   05/2023 -  Anti-estrogen oral therapy   Anastrozole x 5 years     INTERVAL HISTORY:  [name] to review her survivorship care plan detailing her treatment course for breast cancer, as well as monitoring long-term side effects of that treatment, education regarding health maintenance, screening, and overall wellness and health promotion.     Overall, [name] reports feeling quite well   REVIEW OF SYSTEMS:  Review of Systems  Constitutional:  Negative for appetite change, chills, fatigue, fever and unexpected weight change.  HENT:   Negative for hearing loss, lump/mass and trouble swallowing.   Eyes:  Negative for eye problems and icterus.  Respiratory:  Negative for chest tightness, cough and shortness of breath.   Cardiovascular:  Negative for chest pain, leg swelling and palpitations.  Gastrointestinal:  Negative for abdominal distention, abdominal pain, constipation, diarrhea, nausea and vomiting.  Endocrine: Negative for hot flashes.  Genitourinary:  Negative for difficulty urinating.   Musculoskeletal:  Negative for arthralgias.  Skin:  Negative for itching and rash.  Neurological:  Negative for dizziness, extremity weakness, headaches and numbness.   Hematological:  Negative for adenopathy. Does not bruise/bleed easily.  Psychiatric/Behavioral:  Negative for depression. The patient is not nervous/anxious.    Breast: Denies any new nodularity, masses, tenderness, nipple changes, or nipple discharge.       PAST MEDICAL/SURGICAL HISTORY:  Past Medical History:  Diagnosis Date   Angio-edema    Breast cancer (HCC)    Cancer (HCC)    Cancer of right colon (HCC) 10/24/2012   Discussed with h/o- no need for further f/u (08-2020)     Cirrhosis of liver (HCC)    had a liver u/s in 2014 that showed that she was cirrhotic.   Hepatitis C    treated ([facility]) 2015   History of COVID-19 06/03/2020   HIV (human immunodeficiency virus infection) (HCC)    Pneumonia 03/15/2021   in hospital 02-2021 with Pneumococcal pna and non-adherence to meds.   Polysubstance abuse (HCC)    Tuberculosis    neg chest xray   Past Surgical History:  Procedure Laterality Date   BREAST BIOPSY Left 03/22/2023   [place] LT BREAST BX W LOC DEV 1ST LESION IMG BX SPEC US GUIDE 03/22/2023 [facility] MAMMOGRAPHY   BREAST BIOPSY Left 04/10/2023   [place] LT RADIOACTIVE SEED LOC 04/10/2023 [facility] ULTRASOUND   BREAST LUMPECTOMY WITH RADIOACTIVE SEED LOCALIZATION Left 04/11/2023   Procedure: LEFT BREAST SEED LOCALIZED LUMPECTOMY;  Surgeon: Almond Lint, MD;  Location: [facility];  Service: General;  Laterality: Left;   CARPAL TUNNEL RELEASE Left 01/19/2014   Procedure: LEFT CARPAL TUNNEL RELEASE;  Surgeon: Marlowe Shores, MD;  Location: [facility];  Service: Orthopedics;  Laterality: Left;   COLON RESECTION N/A 09/23/2012   Procedure: LAPAROSCOPIC RIGHT COLON RESECTION   ;  Surgeon: Mariella Saa, MD;  Location: [facility];  Service: General;  Laterality: N/A;  LAPOROSCOPY, RIGHT HEMICOLECTOMY   COLON SURGERY  09/23/2012   lap right colectomy   COLONOSCOPY N/A 09/20/2012   Procedure: COLONOSCOPY;  Surgeon: Theda Belfast, MD;  Location: [facility];  Service: Endoscopy;   Laterality: N/A;   OPEN REDUCTION INTERNAL FIXATION (ORIF) DISTAL RADIAL FRACTURE Left 01/19/2014   Procedure: OPEN REDUCTION INTERNAL FIXATION (ORIF) LEFT  DISTAL RADIAL FRACTURE;  Surgeon: Marlowe Shores, MD;  Location: [facility];  Service: Orthopedics;  Laterality: Left;     ALLERGIES:  No Known Allergies   CURRENT MEDICATIONS:  Outpatient Encounter Medications as of 09/06/2023  Medication Sig Note   anastrozole (ARIMIDEX) 1 MG tablet Take 1 tablet (1 mg total) by mouth daily.    Cholecalciferol (VITAMIN D3) 50 MCG (2000 UT) capsule Take 2,000 Units by mouth daily.    DOVATO 50-300 MG tablet TAKE 1 TABLET BY MOUTH EVERY DAY    GINKGO BILOBA PLUS PO Take 120 mg by mouth daily.    Menatetrenone (VITAMIN K2) 100 MCG TABS Take 100 mg by mouth daily.    methadone (DOLOPHINE) 10 MG/5ML solution Take 23 mg by mouth in the morning. 04/04/2023: [facility], Per [facility] LPN late dose 16/1/09, Take home 13 doses. Confirmed the dose is 23 mg   oxyCODONE (OXY IR/ROXICODONE) 5 MG immediate release tablet Take 1 tablet (5 mg total) by mouth every 6 (six) hours as needed for severe pain (pain score 7-10).  No facility-administered encounter medications on file as of 09/06/2023.     ONCOLOGIC FAMILY HISTORY:  Family History  Problem Relation Age of Onset   Angioedema Mother    Cancer Mother 58 - 62       unknown type   Asthma Brother    Throat cancer Maternal Uncle    Angioedema Maternal Grandmother      SOCIAL HISTORY:  Social History   Socioeconomic History   Marital status: Single    Spouse name: Not on file   Number of children: 5   Years of education: Not on file   Highest education level: Not on file  Occupational History   Occupation: Retired/disabled  Tobacco Use   Smoking status: Former    Current packs/day: 0.50    Average packs/day: 0.5 packs/day for 35.0 years (17.5 ttl pk-yrs)    Types: Cigarettes    Passive exposure: Past    Smokeless tobacco: Former  Building services engineer status: Never Used  Substance and Sexual Activity   Alcohol use: Not Currently   Drug use: Not Currently    Comment: methadone for drug rehab   Sexual activity: Not Currently    Birth control/protection: Post-menopausal    Comment: declined condoms  Other Topics Concern   Not on file  Social History Narrative   ** Merged History Encounter **    Lives alone.  Grand-kids always are there   Social Drivers of Corporate investment banker Strain: Low Risk  (06/13/2023)   Overall Financial Resource Strain (CARDIA)    Difficulty of Paying Living Expenses: Not hard at all  Food Insecurity: No Food Insecurity (06/13/2023)   Hunger Vital Sign    Worried About Running Out of Food in the Last Year: Never true    Ran Out of Food in the Last Year: Never true  Transportation Needs: No Transportation Needs (06/13/2023)   PRAPARE - Administrator, Civil Service (Medical): No    Lack of Transportation (Non-Medical): No  Physical Activity: Inactive (06/13/2023)   Exercise Vital Sign    Days of Exercise per Week: 0 days    Minutes of Exercise per Session: 0 min  Stress: Stress Concern Present (06/13/2023)   [employer] of Occupational Health - Occupational Stress Questionnaire    Feeling of Stress : Very much  Social Connections: Moderately Integrated (06/13/2023)   Social Connection and Isolation Panel [NHANES]    Frequency of Communication with Friends and Family: Three times a week    Frequency of Social Gatherings with Friends and Family: Never    Attends Religious Services: More than 4 times per year    Active Member of [employer] or Organizations: Yes    Attends Banker Meetings: Never    Marital Status: Never married  Intimate Partner Violence: Patient Unable To Answer (06/13/2023)   Humiliation, Afraid, Rape, and Kick questionnaire    Fear of Current or Ex-Partner: Patient unable to answer    Emotionally Abused:  Patient unable to answer    Physically Abused: Patient unable to answer    Sexually Abused: Patient unable to answer     OBSERVATIONS/OBJECTIVE:  There were no vitals taken for this visit. GENERAL: Patient is a well appearing female in no acute distress HEENT:  Sclerae anicteric.  Oropharynx clear and moist. No ulcerations or evidence of oropharyngeal candidiasis. Neck is supple.  NODES:  No cervical, supraclavicular, or axillary lymphadenopathy palpated.  BREAST EXAM:  left breast s/p lumpectomy and  radiation, no sign of local recurrence, right breast benign LUNGS:  Clear to auscultation bilaterally.  No wheezes or rhonchi. HEART:  Regular rate and rhythm. No murmur appreciated. ABDOMEN:  Soft, nontender.  Positive, normoactive bowel sounds. No organomegaly palpated. MSK:  No focal spinal tenderness to palpation. Full range of motion bilaterally in the upper extremities. EXTREMITIES:  No peripheral edema.   SKIN:  Clear with no obvious rashes or skin changes. No nail dyscrasia. NEURO:  Nonfocal. Well oriented.  Appropriate affect.   LABORATORY DATA:  None for this visit.  DIAGNOSTIC IMAGING:  None for this visit.      ASSESSMENT AND PLAN:  Ms.. Ruth Stephenson is a pleasant 73 y.o. female with Stage IA left breast invasive ductal carcinoma, ER+/PR+/HER2-, diagnosed in 03/2023, treated with lumpectomy, adjuvant radiation therapy, and anti-estrogen therapy with Anastrozole beginning in 05/2023.  She presents to the Survivorship Clinic for our initial meeting and routine follow-up post-completion of treatment for breast cancer.    1. Stage IA left breast cancer:  [name] is continuing to recover from definitive treatment for breast cancer. She will follow-up with her medical oncologist, Dr.  Al Pimple in 6 months with history and physical exam per surveillance protocol.  She will continue her anti-estrogen therapy with anastrozole. Thus far, she is tolerating the Anastrozole well, with minimal  side effects. Her mammogram is due 02/2024; orders placed today.     Today, a comprehensive survivorship care plan and treatment summary was reviewed with the patient today detailing her breast cancer diagnosis, treatment course, potential late/long-term effects of treatment, appropriate follow-up care with recommendations for the future, and patient education resources.  A copy of this summary, along with a letter will be sent to the patient's primary care provider via mail/fax/In Basket message after today's visit.    #. Problem(s) at Visit______________  #. Bone health:  Given Ms. Regis's age/history of breast cancer and her current treatment regimen including anti-estrogen therapy with Anastrozole, she is at risk for bone demineralization.She is scheduled for bone density testing on 02/06/2024. She was given education on specific activities to promote bone health.  #. Cancer screening:  Due to [name]'s history and her age, she should receive screening for skin cancers, colon cancer, and gynecologic cancers.  The information and recommendations are listed on the patient's comprehensive care plan/treatment summary and were reviewed in detail with the patient.    #. Health maintenance and wellness promotion: [name] was encouraged to consume 5-7 servings of fruits and vegetables per day. We reviewed the "Nutrition Rainbow" handout.  She was also encouraged to engage in moderate to vigorous exercise for 30 minutes per day most days of the week.  She was instructed to limit her alcohol consumption and continue to abstain from tobacco use.     #. Support services/counseling: It is not uncommon for this period of the patient's cancer care trajectory to be one of many emotions and stressors.   She was given information regarding our available services and encouraged to contact me with any questions or for help enrolling in any of our support group/programs.    Follow up instructions:    -Return to  cancer center ion 6 months for follow up  -Mammogram due in 02/2024 -DEXA 01/2024 -She is welcome to return back to the Survivorship Clinic at any time; no additional follow-up needed at this time.  -Consider referral back to survivorship as a long-term survivor for continued surveillance  The patient was provided an opportunity  to ask questions and all were answered. The patient agreed with the plan and demonstrated an understanding of the instructions.   Total encounter time:*** minutes*in face-to-face visit time, chart review, lab review, care coordination, order entry, and documentation of the encounter time.    Lillard Anes, NP 09/06/23 10:51 AM Medical Oncology and Hematology [facility] [address] [place], [place] [address] Tel. [phone]    Fax. [phone]  *Total Encounter Time as defined by the Centers for Medicare and Medicaid Services includes, in addition to the face-to-face time of a patient visit (documented in the note above) non-face-to-face time: obtaining and reviewing outside history, ordering and reviewing medications, tests or procedures, care coordination (communications with other health care professionals or caregivers) and documentation in the medical record.

## 2023-09-10 ENCOUNTER — Encounter: Payer: Self-pay | Admitting: *Deleted

## 2023-09-11 ENCOUNTER — Telehealth: Payer: Self-pay | Admitting: Adult Health

## 2023-09-24 ENCOUNTER — Ambulatory Visit (HOSPITAL_COMMUNITY)
Admission: EM | Admit: 2023-09-24 | Discharge: 2023-09-24 | Disposition: A | Discharge status: Home / Self Care | Attending: Family Medicine | Admitting: Family Medicine

## 2023-09-24 DIAGNOSIS — N3090 Cystitis, unspecified without hematuria: Secondary | ICD-10-CM | POA: Diagnosis not present

## 2023-09-24 DIAGNOSIS — M545 Low back pain, unspecified: Secondary | ICD-10-CM | POA: Insufficient documentation

## 2023-09-24 LAB — POCT URINALYSIS DIP (MANUAL ENTRY)
Bilirubin, UA: NEGATIVE
Glucose, UA: NEGATIVE mg/dL
Ketones, POC UA: NEGATIVE mg/dL
Leukocytes, UA: NEGATIVE
Nitrite, UA: NEGATIVE
Protein Ur, POC: NEGATIVE mg/dL
Spec Grav, UA: 1.015 (ref 1.010–1.025)
Urobilinogen, UA: 0.2 U/dL
pH, UA: 7.5 (ref 5.0–8.0)

## 2023-09-24 MED ORDER — CEPHALEXIN 250 MG PO CAPS: 250.0000 mg | ORAL_CAPSULE | Freq: Three times a day (TID) | ORAL | 0 refills | Status: AC

## 2023-09-24 MED ORDER — PHENAZOPYRIDINE HCL 100 MG PO TABS: 100.0000 mg | ORAL_TABLET | Freq: Three times a day (TID) | ORAL | 0 refills | Status: DC | PRN

## 2023-09-24 NOTE — ED Triage Notes (Signed)
 Here for lower back pain that started 3-4 days ago. Denies any injuries and trauma.

## 2023-09-24 NOTE — ED Provider Notes (Signed)
 [facility]    CSN: [id number] Arrival date & time: 09/24/23  1210      History   Chief Complaint Chief Complaint  Patient presents with   Back Pain    HPI Ruth Stephenson is a 73 y.o. female.    Back Pain Here for 1-1/2 months of low back pain and urinary urgency and urinary incontinence.  Not really any dysuria.  Movement does not affect her back pain.  No trauma.  No fever and no blood in the urine  She did have a lumpectomy for breast cancer a couple of months ago and had radiation  She does take Dovato and is on methadone daily.  NKDA  Past Medical History:  Diagnosis Date   Angio-edema    Breast cancer (HCC)    Cancer (HCC)    Cancer of right colon (HCC) 10/24/2012   Discussed with h/o- no need for further f/u (08-2020)     Cirrhosis of liver (HCC)    had a liver u/s in 2014 that showed that she was cirrhotic.   Hepatitis C    treated ([facility]) 2015   History of COVID-19 06/03/2020   HIV (human immunodeficiency virus infection) (HCC)    Pneumonia 03/15/2021   in hospital 02-2021 with Pneumococcal pna and non-adherence to meds.   Polysubstance abuse (HCC)    Tuberculosis    neg chest xray    Patient Active Problem List   Diagnosis Date Noted   Cellulitis of leg, right 04/19/2023   Genetic testing 04/13/2023   Primary malignant neoplasm of upper outer quadrant of breast, left (HCC) 03/27/2023   Cellulitis of right leg 02/26/2023   DNR (do not resuscitate)/DNI(Do Not Intubate) 02/26/2023   Depression 09/02/2021   Dysequilibrium 03/24/2021   Thrombocytopenia (HCC) 03/11/2021   Other cirrhosis of liver (HCC) 03/10/2021   Obesity (BMI 30-39.9) 07/01/2020   Claudication (HCC) 06/22/2020   Thoracic aortic aneurysm without rupture (HCC) 06/22/2020   Opioid dependence (HCC) 03/14/2020   Hernia of abdominal wall 07/10/2019   Bilateral edema of lower extremity 07/29/2014   History of colon cancer, stage II 07/29/2014   Neuropathy 05/27/2014    GERD (gastroesophageal reflux disease) 05/27/2014   History of pancreatitis 09/19/2012   AIDS (acquired immune deficiency syndrome) (HCC) 09/19/2012   Anemia 09/19/2012    Past Surgical History:  Procedure Laterality Date   BREAST BIOPSY Left 03/22/2023   [place] LT BREAST BX W LOC DEV 1ST LESION IMG BX SPEC US GUIDE 03/22/2023 [facility] MAMMOGRAPHY   BREAST BIOPSY Left 04/10/2023   [place] LT RADIOACTIVE SEED LOC 04/10/2023 [facility] ULTRASOUND   BREAST LUMPECTOMY WITH RADIOACTIVE SEED LOCALIZATION Left 04/11/2023   Procedure: LEFT BREAST SEED LOCALIZED LUMPECTOMY;  Surgeon: Almond Lint, MD;  Location: [facility];  Service: General;  Laterality: Left;   CARPAL TUNNEL RELEASE Left 01/19/2014   Procedure: LEFT CARPAL TUNNEL RELEASE;  Surgeon: Marlowe Shores, MD;  Location: [facility];  Service: Orthopedics;  Laterality: Left;   COLON RESECTION N/A 09/23/2012   Procedure: LAPAROSCOPIC RIGHT COLON RESECTION   ;  Surgeon: Mariella Saa, MD;  Location: [facility];  Service: General;  Laterality: N/A;  LAPOROSCOPY, RIGHT HEMICOLECTOMY   COLON SURGERY  09/23/2012   lap right colectomy   COLONOSCOPY N/A 09/20/2012   Procedure: COLONOSCOPY;  Surgeon: Theda Belfast, MD;  Location: [facility];  Service: Endoscopy;  Laterality: N/A;   OPEN REDUCTION INTERNAL FIXATION (ORIF) DISTAL RADIAL FRACTURE Left 01/19/2014   Procedure: OPEN  REDUCTION INTERNAL FIXATION (ORIF) LEFT  DISTAL RADIAL FRACTURE;  Surgeon: Marlowe Shores, MD;  Location: [facility];  Service: Orthopedics;  Laterality: Left;    OB History   No obstetric history on file.      Home Medications    Prior to Admission medications   Medication Sig Start Date End Date Taking? Authorizing Provider  anastrozole (ARIMIDEX) 1 MG tablet Take 1 tablet (1 mg total) by mouth daily. 05/08/23  Yes Rachel Moulds, MD  cephALEXin (KEFLEX) 250 MG capsule Take 1 capsule (250 mg total) by mouth 3 (three) times daily for 7 days.  09/24/23 10/01/23 Yes BanisterJanace Aris, MD  Cholecalciferol (VITAMIN D3) 50 MCG (2000 UT) capsule Take 2,000 Units by mouth daily.   Yes [provider]  DOVATO 50-300 MG tablet TAKE 1 TABLET BY MOUTH EVERY DAY 11/29/22  Yes [name], MD  GINKGO BILOBA PLUS PO Take 120 mg by mouth daily.   Yes [provider]  Menatetrenone (VITAMIN K2) 100 MCG TABS Take 100 mg by mouth daily.   Yes [provider]  methadone (DOLOPHINE) 10 MG/5ML solution Take 23 mg by mouth in the morning.   Yes [provider]  oxyCODONE (OXY IR/ROXICODONE) 5 MG immediate release tablet Take 1 tablet (5 mg total) by mouth every 6 (six) hours as needed for severe pain (pain score 7-10). 04/11/23  Yes Almond Lint, MD  phenazopyridine (PYRIDIUM) 100 MG tablet Take 1 tablet (100 mg total) by mouth 3 (three) times daily as needed (urinary pain). 09/24/23  Yes Ruth Resides, MD    Family History Family History  Problem Relation Age of Onset   Angioedema Mother    Cancer Mother 18 - 47       unknown type   Asthma Brother    Throat cancer Maternal Uncle    Angioedema Maternal Grandmother     Social History Social History   Tobacco Use   Smoking status: Former    Current packs/day: 0.50    Average packs/day: 0.5 packs/day for 35.0 years (17.5 ttl pk-yrs)    Types: Cigarettes    Passive exposure: Past   Smokeless tobacco: Former  Building services engineer status: Never Used  Substance Use Topics   Alcohol use: Not Currently   Drug use: Not Currently    Comment: methadone for drug rehab     Allergies   Patient has no known allergies.   Review of Systems Review of Systems  Musculoskeletal:  Positive for back pain.     Physical Exam Triage Vital Signs ED Triage Vitals  Encounter Vitals Group     BP 09/24/23 1342 (!) 141/85     Systolic BP Percentile --      Diastolic BP Percentile --      Pulse Rate 09/24/23 1338 (!) 57     Resp 09/24/23 1338 18     Temp  09/24/23 1338 98.3 F (36.8 C)     Temp Source 09/24/23 1338 Oral     SpO2 09/24/23 1338 96 %     Weight --      Height --      Head Circumference --      Peak Flow --      Pain Score 09/24/23 1341 7     Pain Loc --      Pain Education --      Exclude from Growth Chart --    No data found.  Updated Vital  Signs BP (!) 141/85 (BP Location: Left Arm)   Pulse (!) 57   Temp 98.3 F (36.8 C) (Oral)   Resp 18   SpO2 96%   Visual Acuity Right Eye Distance:   Left Eye Distance:   Bilateral Distance:    Right Eye Near:   Left Eye Near:    Bilateral Near:     Physical Exam Vitals reviewed.  Constitutional:      General: She is not in acute distress.    Appearance: She is not ill-appearing, toxic-appearing or diaphoretic.  HENT:     Mouth/Throat:     Mouth: Mucous membranes are moist.  Eyes:     Extraocular Movements: Extraocular movements intact.     Conjunctiva/sclera: Conjunctivae normal.     Pupils: Pupils are equal, round, and reactive to light.  Cardiovascular:     Rate and Rhythm: Normal rate and regular rhythm.     Heart sounds: No murmur heard. Pulmonary:     Effort: Pulmonary effort is normal. No respiratory distress.     Breath sounds: No stridor. No wheezing, rhonchi or rales.  Chest:     Chest wall: No tenderness.  Abdominal:     Palpations: Abdomen is soft.     Tenderness: There is no abdominal tenderness. There is no right CVA tenderness or left CVA tenderness.  Musculoskeletal:     Cervical back: Neck supple.  Lymphadenopathy:     Cervical: No cervical adenopathy.  Skin:    Capillary Refill: Capillary refill takes less than 2 seconds.     Coloration: Skin is not jaundiced or pale.  Neurological:     General: No focal deficit present.     Mental Status: She is alert and oriented to person, place, and time.  Psychiatric:        Behavior: Behavior normal.      UC Treatments / Results  Labs (all labs ordered are listed, but only abnormal  results are displayed) Labs Reviewed  POCT URINALYSIS DIP (MANUAL ENTRY) - Abnormal; Notable for the following components:      Result Value   Blood, UA trace-intact (*)    All other components within normal limits  URINE CULTURE    EKG   Radiology No results found.  Procedures Procedures (including critical care time)  Medications Ordered in UC Medications - No data to display  Initial Impression / Assessment and Plan / UC Course  I have reviewed the triage vital signs and the nursing notes.  Pertinent labs & imaging results that were available during my care of the patient were reviewed by me and considered in my medical decision making (see chart for details).     There is a trace of blood on the urinalysis.  Urine culture is sent.  Keflex is sent in for potential cystitis and some Pyridium is sent in to see if that will give her any benefit.  I have asked her to follow-up with her primary care Final Clinical Impressions(s) / UC Diagnoses   Final diagnoses:  Acute bilateral low back pain without sciatica  Cystitis     Discharge Instructions      The urinalysis had a little bit of blood on it.  This could be a sign of bladder infection more of a stone.  Take cephalexin 250 mg--1 capsule 3 times daily for 7 days  Take Pyridium/phenazopyridine 100 mg--1 tablet 3 times daily as needed for urinary pain.  This medication usually makes the urine orange  Urine culture  is sent, and staff will notify you that it looks like the antibiotic needs to be changed.  Please make sure you are following up with your primary care about this issue.  Can help you decide if you need to do any further testing     ED Prescriptions     Medication Sig Dispense Auth. Provider   cephALEXin (KEFLEX) 250 MG capsule Take 1 capsule (250 mg total) by mouth 3 (three) times daily for 7 days. 21 capsule Ruth Resides, MD   phenazopyridine (PYRIDIUM) 100 MG tablet Take 1 tablet (100 mg  total) by mouth 3 (three) times daily as needed (urinary pain). 10 tablet Marlinda Mike Janace Aris, MD      I have reviewed the PDMP during this encounter.   Ruth Resides, MD 09/24/23 [phone]

## 2023-09-24 NOTE — Discharge Instructions (Addendum)
 The urinalysis had a little bit of blood on it.  This could be a sign of bladder infection more of a stone.  Take cephalexin 250 mg--1 capsule 3 times daily for 7 days  Take Pyridium/phenazopyridine 100 mg--1 tablet 3 times daily as needed for urinary pain.  This medication usually makes the urine orange  Urine culture is sent, and staff will notify you that it looks like the antibiotic needs to be changed.  Please make sure you are following up with your primary care about this issue.  Can help you decide if you need to do any further testing

## 2023-09-25 LAB — URINE CULTURE: Culture: NO GROWTH

## 2023-10-15 ENCOUNTER — Other Ambulatory Visit: Payer: Self-pay | Admitting: *Deleted

## 2023-10-15 DIAGNOSIS — C50412 Malignant neoplasm of upper-outer quadrant of left female breast: Secondary | ICD-10-CM

## 2023-10-16 ENCOUNTER — Inpatient Hospital Stay: Attending: Hematology and Oncology

## 2023-10-16 ENCOUNTER — Ambulatory Visit (HOSPITAL_COMMUNITY)
Admission: RE | Admit: 2023-10-16 | Discharge: 2023-10-16 | Disposition: A | Discharge status: Home / Self Care | Source: Ambulatory Visit | Attending: Adult Health | Admitting: Adult Health

## 2023-10-16 ENCOUNTER — Inpatient Hospital Stay (HOSPITAL_BASED_OUTPATIENT_CLINIC_OR_DEPARTMENT_OTHER): Admitting: Adult Health

## 2023-10-16 VITALS — BP 127/80 | HR 59 | Temp 98.4°F | Resp 18 | Ht 63.0 in | Wt 210.2 lb

## 2023-10-16 DIAGNOSIS — Z170 Estrogen receptor positive status [ER+]: Secondary | ICD-10-CM | POA: Insufficient documentation

## 2023-10-16 DIAGNOSIS — Z923 Personal history of irradiation: Secondary | ICD-10-CM | POA: Insufficient documentation

## 2023-10-16 DIAGNOSIS — Z1721 Progesterone receptor positive status: Secondary | ICD-10-CM | POA: Insufficient documentation

## 2023-10-16 DIAGNOSIS — C50412 Malignant neoplasm of upper-outer quadrant of left female breast: Secondary | ICD-10-CM | POA: Diagnosis present

## 2023-10-16 DIAGNOSIS — M545 Low back pain, unspecified: Secondary | ICD-10-CM

## 2023-10-16 DIAGNOSIS — Z79811 Long term (current) use of aromatase inhibitors: Secondary | ICD-10-CM | POA: Insufficient documentation

## 2023-10-16 DIAGNOSIS — Z87891 Personal history of nicotine dependence: Secondary | ICD-10-CM | POA: Insufficient documentation

## 2023-10-16 DIAGNOSIS — Z1732 Human epidermal growth factor receptor 2 negative status: Secondary | ICD-10-CM | POA: Insufficient documentation

## 2023-10-16 DIAGNOSIS — Z79624 Long term (current) use of inhibitors of nucleotide synthesis: Secondary | ICD-10-CM | POA: Insufficient documentation

## 2023-10-16 LAB — CMP (CANCER CENTER ONLY)
ALT: 15 U/L (ref 0–44)
AST: 29 U/L (ref 15–41)
Albumin: 3.4 g/dL — ABNORMAL LOW (ref 3.5–5.0)
Alkaline Phosphatase: 70 U/L (ref 38–126)
Anion gap: 2 — ABNORMAL LOW (ref 5–15)
BUN: 10 mg/dL (ref 8–23)
CO2: 32 mmol/L (ref 22–32)
Calcium: 9 mg/dL (ref 8.9–10.3)
Chloride: 108 mmol/L (ref 98–111)
Creatinine: 0.7 mg/dL (ref 0.44–1.00)
GFR, Estimated: >60 mL/min (ref >60)
Glucose, Bld: 127 mg/dL — ABNORMAL HIGH (ref 70–99)
Potassium: 4.2 mmol/L (ref 3.5–5.1)
Sodium: 142 mmol/L (ref 135–145)
Total Bilirubin: 1.3 mg/dL — ABNORMAL HIGH (ref 0.0–1.2)
Total Protein: 7 g/dL (ref 6.5–8.1)

## 2023-10-16 LAB — CBC WITH DIFFERENTIAL (CANCER CENTER ONLY)
Abs Immature Granulocytes: 0 10*3/uL (ref 0.00–0.07)
Basophils Absolute: 0 10*3/uL (ref 0.0–0.1)
Basophils Relative: 1 %
Eosinophils Absolute: 0 10*3/uL (ref 0.0–0.5)
Eosinophils Relative: 2 %
HCT: 39.8 % (ref 36.0–46.0)
Hemoglobin: 13.4 g/dL (ref 12.0–15.0)
Immature Granulocytes: 0 %
Lymphocytes Relative: 38 %
Lymphs Abs: 0.6 10*3/uL — ABNORMAL LOW (ref 0.7–4.0)
MCH: 34 pg (ref 26.0–34.0)
MCHC: 33.7 g/dL (ref 30.0–36.0)
MCV: 101 fL — ABNORMAL HIGH (ref 80.0–100.0)
Monocytes Absolute: 0.3 10*3/uL (ref 0.1–1.0)
Monocytes Relative: 17 %
Neutro Abs: 0.7 10*3/uL — ABNORMAL LOW (ref 1.7–7.7)
Neutrophils Relative %: 42 %
Platelet Count: 69 10*3/uL — ABNORMAL LOW (ref 150–400)
RBC: 3.94 MIL/uL (ref 3.87–5.11)
RDW: 13 % (ref 11.5–15.5)
WBC Count: 1.7 10*3/uL — ABNORMAL LOW (ref 4.0–10.5)
nRBC: 0 % (ref 0.0–0.2)

## 2023-10-16 NOTE — Progress Notes (Signed)
 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 Tissue sampling is recommended. Deep LEFT axillary lymph node with borderline cortical thickening. Attempt at tissue sampling is recommended.   03/27/2023 Cancer Staging   Staging form: Breast, AJCC 8th Edition - Clinical stage from 03/27/2023: Stage IA (cT1c, cN0, cM0, G2, ER+, PR+, HER2-) - Signed by Bettejane Brownie, PA-C on 03/27/2023 Stage prefix: Initial diagnosis Method of lymph node assessment: Clinical Histologic grading system: 3 grade system    Pathology Results    INVASIVE MODERATELY DIFFERENTIATED DUCTAL ADENOCARCINOMA, GRADE 2 (3+2+1)      DUCTAL CARCINOMA IN SITU, INTERMEDIATE NUCLEAR GRADE, SOLID AND CRIBRIFORM TYPES      WITHOUT NECROSIS      TUBULE FORMATION: SCORE 3      NUCLEAR PLEOMORPHISM: SCORE 2      MITOTIC COUNT: SCORE 1      TOTAL SCORE: 6      OVERALL GRADE GRADE 2 (6/9)      RARE MICROCALCIFICATION PRESENT WITHIN INVASIVE TUMOR      NEGATIVE FOR ANGIOLYMPHATIC INVASION      TUMOR MEASURES 11 MM IN GREATEST LINEAR EXTENT    The tumor cells are negative for Her2 (1+). Estrogen Receptor:  95%, POSITIVE, STRONG STAINING INTENSITY Progesterone Receptor:  30%, POSITIVE, STRONG STAINING INTENSITY Proliferation Marker Ki67:  5%     Genetic Testing   [facility] CancerNext-Expanded Panel+RNA was Negative. 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 Report date is 04/12/2023.  The CancerNext-Expanded gene panel offered by [facility] and includes sequencing, rearrangement, and RNA analysis for the following 71 genes: AIP, ALK, APC, ATM, AXIN2, BAP1, BARD1, BMPR1A, BRCA1, BRCA2, BRIP1, CDC73, CDH1, CDK4, CDKN1B, CDKN2A, CHEK2, CTNNA1, DICER1, FH, FLCN, KIF1B, LZTR1, MAX, MEN1, MET, MLH1, MSH2, MSH3, MSH6,  MUTYH, NF1, NF2, NTHL1, PALB2, PHOX2B, PMS2, POT1, PRKAR1A, PTCH1, PTEN, RAD51C, RAD51D, RB1, RET, SDHA, SDHAF2, SDHB, SDHC, SDHD, SMAD4, SMARCA4, SMARCB1, SMARCE1, STK11, SUFU, TMEM127, TP53, TSC1, TSC2, and VHL (sequencing and deletion/duplication); EGFR, EGLN1, HOXB13, KIT, MITF, PDGFRA, POLD1, and POLE (sequencing only); EPCAM and GREM1 (deletion/duplication only).     04/11/2023 Surgery   BREAST, LEFT, LUMPECTOMY:  - Invasive ductal carcinoma, 1.8 cm, grade 2  - Ductal carcinoma in situ: Present, intermediate grade, solid and  cribriform type  - Margins, invasive: All margins negative for invasive carcinoma      Closest, invasive: 0.7 cm, inferior margin  - Margins, DCIS: All margins negative for ductal carcinoma in situ      Closest, DCIS: 0.7 cm, inferior margin, see comment  - Lymphovascular invasion: Not identified  - Prognostic markers:  ER 95%, positive, strong staining intensity, PR  30%, positive, strong staining intensity, Her2 negative, Ki-67 5%    04/11/2023 Oncotype testing   14/4%   05/30/2023 - 06/28/2023 Radiation Therapy   Plan Name: Breast_L_BH Site: Breast, Left Technique: 3D Mode: Photon Dose Per Fraction: 5.7 Gy Prescribed Dose (Delivered / Prescribed): 28.5 Gy / 28.5 Gy Prescribed Fxs (Delivered / Prescribed): 5 / 5   05/2023 -  Anti-estrogen oral therapy   Anastrozole  x 5 years     INTERVAL HISTORY:  Ruth Stephenson to review her survivorship care plan detailing her treatment course for breast cancer, as well as monitoring long-term side effects of that treatment, education regarding health maintenance, screening, and overall  wellness and health promotion.     Overall, Ruth Stephenson reports feeling moderately well.  She has been struggling with anastrozole  daily due to nausea.  She has been having nausea and needs to cut the pill in half which has helped.  She has been using this approach for the past 2 weeks.    She also has been experiencing pain in her lower  back which she notes as pressure at her lower back at the tops of her hip radiating down her legs bilaterally and her legs also feel heavy.  She notes this is worse in the morning and as the day goes on her pain improves.  She denies any saddle anesthesia or bowel/bladder incontinence.  She denies any focal weakness or numbness/tingling in her extremities.    REVIEW OF SYSTEMS:  Review of Systems  Constitutional:  Negative for appetite change, chills, fatigue, fever and unexpected weight change.  HENT:   Negative for hearing loss, lump/mass and trouble swallowing.   Eyes:  Negative for eye problems and icterus.  Respiratory:  Negative for chest tightness, cough and shortness of breath.   Cardiovascular:  Negative for chest pain, leg swelling and palpitations.  Gastrointestinal:  Positive for nausea. Negative for abdominal distention, abdominal pain, blood in stool, constipation, diarrhea, rectal pain and vomiting.  Endocrine: Negative for hot flashes.  Genitourinary:  Negative for difficulty urinating.   Musculoskeletal:  Positive for back pain. Negative for arthralgias and gait problem.  Skin:  Negative for itching and rash.  Neurological:  Negative for dizziness, extremity weakness, gait problem, headaches, light-headedness, numbness, seizures and speech difficulty.  Hematological:  Negative for adenopathy. Does not bruise/bleed easily.  Psychiatric/Behavioral:  Negative for depression. The patient is not nervous/anxious.    Breast: Denies any new nodularity, masses, tenderness, nipple changes, or nipple discharge.       PAST MEDICAL/SURGICAL HISTORY:  Past Medical History:  Diagnosis Date   Angio-edema    Breast cancer (HCC)    Cancer (HCC)    Cancer of right colon (HCC) 10/24/2012   Discussed with h/o- no need for further f/u (08-2020)     Cirrhosis of liver (HCC)    had a liver u/s in 2014 that showed that she was cirrhotic.   Hepatitis C    treated ([facility] ) 2015   History of  COVID-19 06/03/2020   HIV (human immunodeficiency virus infection) (HCC)    Pneumonia 03/15/2021   in hospital 02-2021 with Pneumococcal pna and non-adherence to meds.   Polysubstance abuse (HCC)    Tuberculosis    neg chest xray   Past Surgical History:  Procedure Laterality Date   BREAST BIOPSY Left 03/22/2023   US  LT BREAST BX W LOC DEV 1ST LESION IMG BX SPEC US  GUIDE 03/22/2023 [facility] MAMMOGRAPHY   BREAST BIOPSY Left 04/10/2023   US  LT RADIOACTIVE SEED LOC 04/10/2023 [facility] ULTRASOUND   BREAST LUMPECTOMY WITH RADIOACTIVE SEED LOCALIZATION Left 04/11/2023   Procedure: LEFT BREAST SEED LOCALIZED LUMPECTOMY;  Surgeon: Lockie Rima, MD;  Location: [facility];  Service: General;  Laterality: Left;   CARPAL TUNNEL RELEASE Left 01/19/2014   Procedure: LEFT CARPAL TUNNEL RELEASE;  Surgeon: Hedy Living, MD;  Location: [facility];  Service: Orthopedics;  Laterality: Left;   COLON RESECTION N/A 09/23/2012   Procedure: LAPAROSCOPIC RIGHT COLON RESECTION   ;  Surgeon: Quitman Bucy, MD;  Location: [facility];  Service: General;  Laterality: N/A;  LAPOROSCOPY, RIGHT HEMICOLECTOMY   COLON SURGERY  09/23/2012  lap right colectomy   COLONOSCOPY N/A 09/20/2012   Procedure: COLONOSCOPY;  Surgeon: Almeda Aris, MD;  Location: [facility];  Service: Endoscopy;  Laterality: N/A;   OPEN REDUCTION INTERNAL FIXATION (ORIF) DISTAL RADIAL FRACTURE Left 01/19/2014   Procedure: OPEN REDUCTION INTERNAL FIXATION (ORIF) LEFT  DISTAL RADIAL FRACTURE;  Surgeon: Hedy Living, MD;  Location: [facility];  Service: Orthopedics;  Laterality: Left;     ALLERGIES:  No Known Allergies   CURRENT MEDICATIONS:  Outpatient Encounter Medications as of 10/16/2023  Medication Sig Note   anastrozole  (ARIMIDEX ) 1 MG tablet Take 1 tablet (1 mg total) by mouth daily.    Cholecalciferol  (VITAMIN D3) 50 MCG (2000 UT) capsule Take 2,000 Units by mouth daily.    COLACE 2-IN-1 8.6-50 MG tablet  Take 1 tablet by mouth daily.    DOVATO  50-300 MG tablet TAKE 1 TABLET BY MOUTH EVERY DAY    GINKGO BILOBA PLUS PO Take 120 mg by mouth daily.    Menatetrenone (VITAMIN K2) 100 MCG TABS Take 100 mg by mouth daily.    methadone  (DOLOPHINE ) 10 MG/5ML solution Take 23 mg by mouth in the morning. 04/04/2023: [facility], Per [facility] LPN late dose 40/9/81, Take home 13 doses. Confirmed the dose is 23 mg   Multiple Vitamins-Minerals (MULTIVITAMIN ADULTS 50+ PO) Take 1 tablet by mouth daily.    oxyCODONE  (OXY IR/ROXICODONE ) 5 MG immediate release tablet Take 1 tablet (5 mg total) by mouth every 6 (six) hours as needed for severe pain (pain score 7-10).    phenazopyridine  (PYRIDIUM ) 100 MG tablet Take 1 tablet (100 mg total) by mouth 3 (three) times daily as needed (urinary pain).    No facility-administered encounter medications on file as of 10/16/2023.     ONCOLOGIC FAMILY HISTORY:  Family History  Problem Relation Age of Onset   Angioedema Mother    Cancer Mother 67 - 84       unknown type   Asthma Brother    Throat cancer Maternal Uncle    Angioedema Maternal Grandmother      SOCIAL HISTORY:  Social History   Socioeconomic History   Marital status: Single    Spouse name: Not on file   Number of children: 5   Years of education: Not on file   Highest education level: Not on file  Occupational History   Occupation: Retired/disabled  Tobacco Use   Smoking status: Former    Current packs/day: 0.50    Average packs/day: 0.5 packs/day for 35.0 years (17.5 ttl pk-yrs)    Types: Cigarettes    Passive exposure: Past   Smokeless tobacco: Former  Building services engineer status: Never Used  Substance and Sexual Activity   Alcohol  use: Not Currently   Drug use: Not Currently    Comment: methadone  for drug rehab   Sexual activity: Not Currently    Birth control/protection: Post-menopausal    Comment: declined condoms  Other Topics Concern   Not on file  Social History  Narrative   ** Merged History Encounter **    Lives alone.  Grand-kids always are there   Social Drivers of Corporate investment banker Strain: Low Risk  (06/13/2023)   Overall Financial Resource Strain (CARDIA)    Difficulty of Paying Living Expenses: Not hard at all  Food Insecurity: No Food Insecurity (06/13/2023)   Hunger Vital Sign    Worried About Running Out of Food in the Last Year: Never true  Ran Out of Food in the Last Year: Never true  Transportation Needs: No Transportation Needs (06/13/2023)   PRAPARE - Administrator, Civil Service (Medical): No    Lack of Transportation (Non-Medical): No  Physical Activity: Inactive (06/13/2023)   Exercise Vital Sign    Days of Exercise per Week: 0 days    Minutes of Exercise per Session: 0 min  Stress: Stress Concern Present (06/13/2023)   [employer] of Occupational Health - Occupational Stress Questionnaire    Feeling of Stress : Very much  Social Connections: Moderately Integrated (06/13/2023)   Social Connection and Isolation Panel [NHANES]    Frequency of Communication with Friends and Family: Three times a week    Frequency of Social Gatherings with Friends and Family: Never    Attends Religious Services: More than 4 times per year    Active Member of Clubs or Organizations: Yes    Attends Banker Meetings: Never    Marital Status: Never married  Intimate Partner Violence: Patient Unable To Answer (06/13/2023)   Humiliation, Afraid, Rape, and Kick questionnaire    Fear of Current or Ex-Partner: Patient unable to answer    Emotionally Abused: Patient unable to answer    Physically Abused: Patient unable to answer    Sexually Abused: Patient unable to answer     OBSERVATIONS/OBJECTIVE:  BP 127/80 (BP Location: Left Arm, Patient Position: Sitting)   Pulse (!) 59   Temp 98.4 F (36.9 C) (Temporal)   Resp 18   Ht 5\' 3"  (1.6 m)   Wt 210 lb 3.2 oz (95.3 kg)   SpO2 96%   BMI 37.24 kg/m   GENERAL: Patient is a well appearing female in no acute distress HEENT:  Sclerae anicteric.  Oropharynx clear and moist. No ulcerations or evidence of oropharyngeal candidiasis. Neck is supple.  NODES:  No cervical, supraclavicular, or axillary lymphadenopathy palpated.  BREAST EXAM:  left breast s/p lumpectomy and radiation, no sign of local recurrence, right breast benign LUNGS:  Clear to auscultation bilaterally.  No wheezes or rhonchi. HEART:  Regular rate and rhythm. No murmur appreciated. ABDOMEN:  Soft, nontender.  Positive, normoactive bowel sounds. No organomegaly palpated. MSK:  No focal spinal tenderness to palpation. Full range of motion bilaterally in the upper extremities. EXTREMITIES:  No peripheral edema.   SKIN:  Clear with no obvious rashes or skin changes. No nail dyscrasia. NEURO:  Nonfocal. Well oriented.  Appropriate affect.   LABORATORY DATA:  None for this visit.  DIAGNOSTIC IMAGING:  None for this visit.      ASSESSMENT AND PLAN:  Ruth Stephenson is a pleasant 73 y.o. female with Stage IA left breast invasive ductal carcinoma, ER+/PR+/HER2-, diagnosed in 03/2023, treated with lumpectomy, adjuvant radiation therapy, and anti-estrogen therapy with Anastrozole  beginning in 05/2023.  She presents to the Survivorship Clinic for our initial meeting and routine follow-up post-completion of treatment for breast cancer.    1. Stage IA left breast cancer:  Ruth Stephenson is continuing to recover from definitive treatment for breast cancer. She will follow-up with her medical oncologist, Dr.  Arno Bibles in 6 months with history and physical exam per surveillance protocol.  She will continue her anti-estrogen therapy with Anastrozole .  She and I discussed the need for her to go up to a full dose and if unable to tolerate we should consider changing to a different antiestrogen therapy.  Her mammogram is due 02/2024; orders placed today.   Today, a comprehensive survivorship care  plan and  treatment summary was reviewed with the patient today detailing her breast cancer diagnosis, treatment course, potential late/long-term effects of treatment, appropriate follow-up care with recommendations for the future, and patient education resources.  A copy of this summary, along with a letter will be sent to the patient's primary care provider via mail/fax/In Basket message after today's visit.    2. Lower back pain: [facility]  I recommended we obtain xrays to evaluate further.    3. Bone health:  Given Ruth Stephenson's age/history of breast cancer and her current treatment regimen including anti-estrogen therapy with Anastrozole , she is at risk for bone demineralization. She is scheduled for bone density testing in 01/2024. She was given education on specific activities to promote bone health.  4. Cancer screening:  Due to Ruth Stephenson's history and her age, she should receive screening for skin cancers, colon cancer, and gynecologic cancers.  The information and recommendations are listed on the patient's comprehensive care plan/treatment summary and were reviewed in detail with the patient.    5. Health maintenance and wellness promotion: Ruth Stephenson was encouraged to consume 5-7 servings of fruits and vegetables per day. We reviewed the "Nutrition Rainbow" handout.  She was also encouraged to engage in moderate to vigorous exercise for 30 minutes per day most days of the week.  She was instructed to limit her alcohol  consumption and continue to abstain from tobacco use.     6. Support services/counseling: It is not uncommon for this period of the patient's cancer care trajectory to be one of many emotions and stressors.   She was given information regarding our available services and encouraged to contact me with any questions or for help enrolling in any of our support group/programs.    Follow up instructions:    -Return to cancer center in 6 months for f/u  with Dr. Arno Bibles -Xrays of lumbar spine for further evaluation  -Mammogram due in 02/2024 -Bone density testing in 01/2024 -She is welcome to return back to the Survivorship Clinic at any time; no additional follow-up needed at this time.  -Consider referral back to survivorship as a long-term survivor for continued surveillance  The patient was provided an opportunity to ask questions and all were answered. The patient agreed with the plan and demonstrated an understanding of the instructions.   Total encounter time:45 minutes*in face-to-face visit time, chart review, lab review, care coordination, order entry, and documentation of the encounter time.    Ruth Baars, NP 10/16/23 12:25 PM Medical Oncology and Hematology [facility] [address] [place], [place] [address] Tel. [phone]    Fax. [phone]  *Total Encounter Time as defined by the Centers for Medicare and Medicaid Services includes, in addition to the face-to-face time of a patient visit (documented in the note above) non-face-to-face time: obtaining and reviewing outside history, ordering and reviewing medications, tests or procedures, care coordination (communications with other health care professionals or caregivers) and documentation in the medical record.

## 2023-10-22 ENCOUNTER — Other Ambulatory Visit: Payer: Self-pay | Admitting: Infectious Diseases

## 2023-10-23 ENCOUNTER — Other Ambulatory Visit: Payer: Self-pay | Admitting: Infectious Diseases

## 2023-10-24 ENCOUNTER — Telehealth: Payer: Self-pay | Admitting: *Deleted

## 2023-10-24 NOTE — Telephone Encounter (Signed)
-----   Message from Conway Dennis sent at 10/23/2023  9:08 AM EDT ----- Please let patient know that her xrays demonstrate anterolisthesis which means that her vertebrae has moved slightly forward.  I would recommend PT to help strengthen her back muscles which can help with the pain.  Another option would be to see our sports medicine team. ----- Message ----- From: Interface, Rad Results In Sent: 10/22/2023   4:29 PM EDT To: Percival Brace, NP

## 2023-10-24 NOTE — Telephone Encounter (Signed)
 Called pt with message below. Advised to call office to go over results.

## 2023-10-26 ENCOUNTER — Telehealth: Payer: Self-pay

## 2023-10-26 NOTE — Telephone Encounter (Addendum)
 Call pt twice about results below. Lvm for pt to call back for results below.----- Message from Conway Dennis sent at 10/23/2023  9:08 AM EDT ----- Please let patient know that her xrays demonstrate anterolisthesis which means that her vertebrae has moved slightly forward.  I would recommend PT to help strengthen her back muscles which can help with the pain.  Another option would be to see our sports medicine team. ----- Message ----- From: Interface, Rad Results In Sent: 10/22/2023   4:29 PM EDT To: Percival Brace, NP

## 2023-10-29 ENCOUNTER — Telehealth: Payer: Self-pay

## 2023-10-29 NOTE — Telephone Encounter (Signed)
 Returned her call and given message from Alwin Baars, NP, please let patient know that her xrays demonstrate anterolisthesis which means that her vertebrae has moved slightly forward.  I would recommend PT to help strengthen her back muscles which can help with the pain.  Another option would be to see our sports medicine team   Prayer verbalized understanding and would like PT referral and see sports medicine team. She is applying heat and will try OTC tylenol . Forwarded a message to L. Debbie Fails, DNP.  She will clean out voicemail box.

## 2023-10-29 NOTE — Telephone Encounter (Signed)
 Attempted to call and voicemail full/ unable to leave a message.  Tried to call and give message from Alwin Baars, DNP, Please let patient know that her xrays demonstrate anterolisthesis which means that her vertebrae has moved slightly forward.  I would recommend PT to help strengthen her back muscles which can help with the pain.  Another option would be to see our sports medicine team.

## 2023-10-30 ENCOUNTER — Other Ambulatory Visit: Payer: Self-pay | Admitting: *Deleted

## 2023-10-30 DIAGNOSIS — C50412 Malignant neoplasm of upper-outer quadrant of left female breast: Secondary | ICD-10-CM

## 2023-11-05 ENCOUNTER — Ambulatory Visit: Payer: 59 | Attending: Surgery | Admitting: Surgery

## 2023-11-05 ENCOUNTER — Encounter: Payer: Self-pay | Admitting: Surgery

## 2023-11-05 VITALS — BP 104/55 | HR 68 | Temp 98.3°F | Resp 20 | Ht 63.0 in | Wt 208.0 lb

## 2023-11-05 DIAGNOSIS — I83891 Varicose veins of right lower extremities with other complications: Secondary | ICD-10-CM | POA: Diagnosis not present

## 2023-11-05 NOTE — Progress Notes (Signed)
 Vascular and Vein Specialist of   Patient name: Ruth Stephenson MRN: [id number] DOB: 02-Apr-1951 Sex: female   REASON FOR VISIT:    Follow up  HISOTRY OF PRESENT ILLNESS:    Ruth Stephenson is a 73 y.o. female who returns today for follow-up of bilateral leg swelling.  She was originally seen in [place] clinic in February 2025.  She complains of pressure and tightness in her legs if she stands up for prolonged periods of time.  This causes her to have issues with balance and equilibrium.  She has never had any vein procedures.  She has worn knee-high compression stockings however she has issues with this cutting off her circulation above the sock.  She was counseled about wearing thigh-high compression stockings.  She was told the importance of leg elevation and exercise.  Follow-up  The patient suffers from HIV as well as breast cancer.  She is a former smoker.  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   PAST MEDICAL HISTORY:   Past Medical History:  Diagnosis Date   Angio-edema    Breast cancer (HCC)    Cancer (HCC)    Cancer of right colon (HCC) 10/24/2012   Discussed with h/o- no need for further f/u (08-2020)     Cirrhosis of liver (HCC)    had a liver u/s in 2014 that showed that she was cirrhotic.   Hepatitis C    treated (Harvoni ) 2015   History of COVID-19 06/03/2020   HIV (human immunodeficiency virus infection) (HCC)    Pneumonia 03/15/2021   in hospital 02-2021 with Pneumococcal pna and non-adherence to meds.   Polysubstance abuse (HCC)    Tuberculosis    neg chest xray     FAMILY HISTORY:   Family History  Problem Relation Age of Onset   Angioedema Mother    Cancer Mother 77 - 8       unknown type   Asthma Brother    Throat cancer Maternal Uncle    Angioedema Maternal Grandmother     SOCIAL HISTORY:   Social History   Tobacco Use   Smoking status: Former    Current packs/day: 0.50    Average packs/day: 0.5  packs/day for 35.0 years (17.5 ttl pk-yrs)    Types: Cigarettes    Passive exposure: Past   Smokeless tobacco: Former  Substance Use Topics   Alcohol  use: Not Currently     ALLERGIES:   No Known Allergies   CURRENT MEDICATIONS:   Current Outpatient Medications  Medication Sig Dispense Refill   anastrozole  (ARIMIDEX ) 1 MG tablet Take 1 tablet (1 mg total) by mouth daily. 90 tablet 3   Cholecalciferol  (VITAMIN D3) 50 MCG (2000 UT) capsule Take 2,000 Units by mouth daily.     COLACE 2-IN-1 8.6-50 MG tablet Take 1 tablet by mouth daily.     DOVATO  50-300 MG tablet TAKE 1 TABLET BY MOUTH EVERY DAY 30 tablet 11   GINKGO BILOBA PLUS PO Take 120 mg by mouth daily.     Menatetrenone (VITAMIN K2) 100 MCG TABS Take 100 mg by mouth daily.     methadone  (DOLOPHINE ) 10 MG/5ML solution Take 23 mg by mouth in the morning.     Multiple Vitamins-Minerals (MULTIVITAMIN ADULTS 50+ PO) Take 1 tablet by mouth daily.     phenazopyridine  (PYRIDIUM ) 100 MG tablet Take 1 tablet (100 mg total) by mouth 3 (three) times daily as needed (urinary pain). 10 tablet 0   No current facility-administered medications  for this visit.    REVIEW OF SYSTEMS:   [X]  denotes positive finding, [ ]  denotes negative finding Cardiac  Comments:  Chest pain or chest pressure:    Shortness of breath upon exertion:    Short of breath when lying flat:    Irregular heart rhythm:        Vascular    Pain in calf, thigh, or hip brought on by ambulation:    Pain in feet at night that wakes you up from your sleep:     Blood clot in your veins:    Leg swelling:  x       Pulmonary    Oxygen at home:    Productive cough:     Wheezing:         Neurologic    Sudden weakness in arms or legs:     Sudden numbness in arms or legs:     Sudden onset of difficulty speaking or slurred speech:    Temporary loss of vision in one eye:     Problems with dizziness:         Gastrointestinal    Blood in stool:     Vomited blood:          Genitourinary    Burning when urinating:     Blood in urine:        Psychiatric    Major depression:         Hematologic    Bleeding problems:    Problems with blood clotting too easily:        Skin    Rashes or ulcers:        Constitutional    Fever or chills:      PHYSICAL EXAM:   There were no vitals filed for this visit.  GENERAL: The patient is a well-nourished female, in no acute distress. The vital signs are documented above. CARDIAC: There is a regular rate and rhythm.  VASCULAR: SonoSite was used to evaluate the right great saphenous vein which was markedly dilated and straight PULMONARY: Non-labored respirations MUSCULOSKELETAL: There are no major deformities or cyanosis. NEUROLOGIC: No focal weakness or paresthesias are detected. SKIN: Hyper pigmentation PSYCHIATRIC: The patient has a normal affect.  STUDIES:   I have reviewed the following reflux study: Venous Reflux Times  +--------------+---------+------+-----------+------------+--------+  RIGHT        Reflux NoRefluxReflux TimeDiameter cmsComments                          Yes                                   +--------------+---------+------+-----------+------------+--------+  CFV                    yes   >1 second                       +--------------+---------+------+-----------+------------+--------+  FV mid        no                                              +--------------+---------+------+-----------+------------+--------+  Popliteal    no                                              +--------------+---------+------+-----------+------------+--------+  GSV at [facility]              yes    >500 ms      0.95              +--------------+---------+------+-----------+------------+--------+  GSV prox thigh          yes    >500 ms      0.57              +--------------+---------+------+-----------+------------+--------+  GSV mid thigh           yes     >500 ms      0.61              +--------------+---------+------+-----------+------------+--------+  GSV dist thigh          yes    >500 ms      0.64              +--------------+---------+------+-----------+------------+--------+  GSV at knee             yes    >500 ms      0.57              +--------------+---------+------+-----------+------------+--------+  GSV prox calf no                            0.32              +--------------+---------+------+-----------+------------+--------+  SSV Pop Fossa no                            0.34              +--------------+---------+------+-----------+------------+--------+  SSV prox calf no                            0.37              +--------------+---------+------+-----------+------------+--------+    MEDICAL ISSUES:   CEAP class IV right leg: Patient has significant axial reflux in the great saphenous vein on the right leg.  She has chronic leg swelling and heaviness with hyperpigmentation.  She has tried exercise, elevation, and 20-30 thigh-high compression stockings but still has significant limitations.  I think she would be an excellent candidate for laser ablation of the right great saphenous vein.  Will work on getting this scheduled in the near future.    Marti Slates, MD, FACS [facility] Vein Specialists of [facility] [phone] Pager [phone]

## 2023-11-07 ENCOUNTER — Telehealth: Payer: Self-pay

## 2023-11-21 ENCOUNTER — Other Ambulatory Visit: Payer: Self-pay | Admitting: *Deleted

## 2023-11-21 DIAGNOSIS — I83891 Varicose veins of right lower extremities with other complications: Secondary | ICD-10-CM

## 2023-11-21 MED ORDER — LORAZEPAM 1 MG PO TABS: ORAL_TABLET | ORAL | 0 refills | Status: DC

## 2023-11-21 NOTE — Telephone Encounter (Signed)
 Ruth Stephenson

## 2023-11-29 ENCOUNTER — Encounter: Payer: Self-pay | Admitting: Surgery

## 2023-11-29 ENCOUNTER — Ambulatory Visit: Attending: Surgery | Admitting: Surgery

## 2023-11-29 VITALS — BP 115/67 | HR 60 | Temp 98.2°F | Resp 18 | Ht 63.0 in

## 2023-11-29 DIAGNOSIS — I83891 Varicose veins of right lower extremities with other complications: Secondary | ICD-10-CM | POA: Diagnosis not present

## 2023-11-29 NOTE — Progress Notes (Signed)
    Laser Ablation Procedure    Date: 11/29/2023 Ruth Stephenson DOB:04-May-1951 Consent signed: Yes     Surgeon: Dr. Genny Kid   Procedure: Laser Ablation: right Greater Saphenous Vein BP 115/67 (BP Location: Left Arm, Patient Position: Sitting, Cuff Size: Normal)   Pulse 60   Temp 98.2 F (36.8 C) (Temporal)   Resp 18   Ht 5\' 3"  (1.6 m)   SpO2 94%   BMI 36.85 kg/m   Tumescent Anesthesia: 500 cc 0.9% NaCl with 50 cc Lidocaine  HCL 1%  and 15 cc 8.4% NaHCO3 Local Anesthesia: 7 cc Lidocaine  HCL and NaHCO3 (ratio 2:1) 7 watts continuous mode    Total energy: 1674.0 joules   Total time: 239 seconds Treatment Length 30 cm  Laser Fiber Ref. # [id number]      Lot # [id number] Patient tolerated procedure well  Notes:  All staff members wore facial masks . Pt had 1 tab of 1 mg ativan  at 10;25 am prior to the surgical procedure.    Description of Procedure: After marking the course of the secondary varicosities, the patient was placed on the operating table in the supine position, and the right leg was prepped and draped in sterile fashion.   Local anesthetic was administered and under ultrasound guidance the saphenous vein was accessed with a micro needle and guide wire; then the mirco puncture sheath was placed.  A guide wire was inserted saphenofemoral junction , followed by a 5 french sheath.  The position of the sheath and then the laser fiber below the junction was confirmed using the ultrasound.  Tumescent anesthesia was administered along the course of the saphenous vein using ultrasound guidance. The patient was placed in Trendelenburg position and protective laser glasses were placed on patient and staff, and the laser was fired at 7 watts continuous mode for a total of 1674.0 joules.  Steri strips were applied to the insertion site  and ABD pads and thigh high compression stockings were applied.  Ace wrap bandages were applied over the phlebectomy sites and at the top of the saphenofemoral  junction. Blood loss was less than 15 cc.  Discharge instructions reviewed with patient and hardcopy of discharge instructions given to patient to take home. The patient was wheeled out of the operating room having tolerated the procedure well.

## 2023-12-05 ENCOUNTER — Other Ambulatory Visit: Payer: Self-pay | Admitting: Infectious Diseases

## 2023-12-05 DIAGNOSIS — B20 Human immunodeficiency virus [HIV] disease: Secondary | ICD-10-CM

## 2023-12-13 ENCOUNTER — Encounter: Payer: Self-pay | Admitting: Surgery

## 2023-12-13 ENCOUNTER — Ambulatory Visit (HOSPITAL_COMMUNITY)
Admission: RE | Admit: 2023-12-13 | Discharge: 2023-12-13 | Disposition: A | Discharge status: Home / Self Care | Source: Ambulatory Visit | Attending: Vascular Surgery | Admitting: Vascular Surgery

## 2023-12-13 ENCOUNTER — Ambulatory Visit: Attending: Surgery | Admitting: Surgery

## 2023-12-13 VITALS — BP 135/72 | HR 61 | Temp 98.0°F

## 2023-12-13 DIAGNOSIS — I83891 Varicose veins of right lower extremities with other complications: Secondary | ICD-10-CM | POA: Diagnosis present

## 2023-12-13 NOTE — Progress Notes (Signed)
 Vascular and Vein Specialist of [place]  Patient name: Ruth Stephenson MRN: [id number] DOB: 06/15/1951 Sex: female   REASON FOR VISIT:    Follow-up  HISOTRY OF PRESENT ILLNESS:    Ruth Stephenson is a 73 y.o. female who is status post endovenous laser ablation of the right leg on 11/29/2023 for class IV disease.  She says her right leg feels a lot better.  The swelling is significantly improved   PAST MEDICAL HISTORY:   Past Medical History:  Diagnosis Date   Angio-edema    Breast cancer (HCC)    Cancer (HCC)    Cancer of right colon (HCC) 10/24/2012   Discussed with h/o- no need for further f/u (08-2020)     Cirrhosis of liver (HCC)    had a liver u/s in 2014 that showed that she was cirrhotic.   Hepatitis C    treated (Harvoni ) 2015   History of COVID-19 06/03/2020   HIV (human immunodeficiency virus infection) (HCC)    Pneumonia 03/15/2021   in hospital 02-2021 with Pneumococcal pna and non-adherence to meds.   Polysubstance abuse (HCC)    Tuberculosis    neg chest xray     FAMILY HISTORY:   Family History  Problem Relation Age of Onset   Angioedema Mother    Cancer Mother 74 - 31       unknown type   Asthma Brother    Throat cancer Maternal Uncle    Angioedema Maternal Grandmother     SOCIAL HISTORY:   Social History   Tobacco Use   Smoking status: Former    Current packs/day: 0.50    Average packs/day: 0.5 packs/day for 35.0 years (17.5 ttl pk-yrs)    Types: Cigarettes    Passive exposure: Past   Smokeless tobacco: Former  Substance Use Topics   Alcohol  use: Not Currently     ALLERGIES:   No Known Allergies   CURRENT MEDICATIONS:   Current Outpatient Medications  Medication Sig Dispense Refill   anastrozole  (ARIMIDEX ) 1 MG tablet Take 1 tablet (1 mg total) by mouth daily. 90 tablet 3   Cholecalciferol  (VITAMIN D3) 50 MCG (2000 UT) capsule Take 2,000 Units by mouth daily.     COLACE 2-IN-1 8.6-50 MG  tablet Take 1 tablet by mouth daily.     DOVATO  50-300 MG tablet TAKE 1 TABLET BY MOUTH EVERY DAY 30 tablet 11   GINKGO BILOBA PLUS PO Take 120 mg by mouth daily.     Menatetrenone (VITAMIN K2) 100 MCG TABS Take 100 mg by mouth daily.     methadone  (DOLOPHINE ) 10 MG/5ML solution Take 23 mg by mouth in the morning.     Multiple Vitamins-Minerals (MULTIVITAMIN ADULTS 50+ PO) Take 1 tablet by mouth daily.     phenazopyridine  (PYRIDIUM ) 100 MG tablet Take 1 tablet (100 mg total) by mouth 3 (three) times daily as needed (urinary pain). 10 tablet 0   No current facility-administered medications for this visit.    REVIEW OF SYSTEMS:   [X]  denotes positive finding, [ ]  denotes negative finding Cardiac  Comments:  Chest pain or chest pressure:    Shortness of breath upon exertion:    Short of breath when lying flat:    Irregular heart rhythm:        Vascular    Pain in calf, thigh, or hip brought on by ambulation:    Pain in feet at night that wakes you up from your sleep:  Blood clot in your veins:    Leg swelling:  x       Pulmonary    Oxygen at home:    Productive cough:     Wheezing:         Neurologic    Sudden weakness in arms or legs:     Sudden numbness in arms or legs:     Sudden onset of difficulty speaking or slurred speech:    Temporary loss of vision in one eye:     Problems with dizziness:         Gastrointestinal    Blood in stool:     Vomited blood:         Genitourinary    Burning when urinating:     Blood in urine:        Psychiatric    Major depression:         Hematologic    Bleeding problems:    Problems with blood clotting too easily:        Skin    Rashes or ulcers:        Constitutional    Fever or chills:      PHYSICAL EXAM:   Vitals:   12/13/23 1025  BP: 135/72  Pulse: 61  Temp: 98 F (36.7 C)  TempSrc: Temporal  SpO2: 95%    GENERAL: The patient is a well-nourished female, in no acute distress. The vital signs are documented  above. CARDIAC: There is a regular rate and rhythm.  VASCULAR: Right leg is significantly less edematous than the left. PULMONARY: Non-labored respirations  NEUROLOGIC: No focal weakness or paresthesias are detected. SKIN: There are no ulcers or rashes noted. PSYCHIATRIC: The patient has a normal affect.  STUDIES:   I have reviewed the following venous reflux study: Right:  - No evidence of deep vein thrombosis seen in the right lower extremity,  from the common femoral through the popliteal veins.  - Successful ablation of the greater saphenous vein from the distal thigh  to within 2cm of the SFJ.   MEDICAL ISSUES:   CEAP class IV disease bilaterally: The patient has undergone successful endovenous laser ablation of the right saphenous vein.  Postprocedural ultrasound shows no evidence of DVT and successful ablation.  She has noticed a significant improvement in the swelling in her right leg.  She is interested in getting her left leg evaluated.  I am going to have her come back in 4 to 6 weeks for a venous reflux study of the left leg and further discussions regarding treatment.    Marti Slates, MD, FACS V[facility] and Vein Specialists of [facility] [phone] Pager [phone]

## 2023-12-14 ENCOUNTER — Other Ambulatory Visit: Payer: Self-pay | Admitting: *Deleted

## 2023-12-14 DIAGNOSIS — R600 Localized edema: Secondary | ICD-10-CM

## 2023-12-14 DIAGNOSIS — I872 Venous insufficiency (chronic) (peripheral): Secondary | ICD-10-CM

## 2023-12-14 DIAGNOSIS — I83891 Varicose veins of right lower extremities with other complications: Secondary | ICD-10-CM

## 2024-02-04 ENCOUNTER — Ambulatory Visit (HOSPITAL_COMMUNITY)
Admission: RE | Admit: 2024-02-04 | Discharge: 2024-02-04 | Disposition: A | Discharge status: Home / Self Care | Source: Ambulatory Visit | Attending: Surgery | Admitting: Surgery

## 2024-02-04 ENCOUNTER — Ambulatory Visit: Attending: Surgery | Admitting: Surgery

## 2024-02-04 ENCOUNTER — Encounter: Payer: Self-pay | Admitting: Surgery

## 2024-02-04 VITALS — BP 104/59 | HR 62 | Temp 98.4°F | Resp 22 | Ht 63.0 in | Wt 214.8 lb

## 2024-02-04 DIAGNOSIS — I83891 Varicose veins of right lower extremities with other complications: Secondary | ICD-10-CM | POA: Diagnosis present

## 2024-02-04 DIAGNOSIS — I83892 Varicose veins of left lower extremities with other complications: Secondary | ICD-10-CM | POA: Diagnosis not present

## 2024-02-04 DIAGNOSIS — R600 Localized edema: Secondary | ICD-10-CM | POA: Insufficient documentation

## 2024-02-04 DIAGNOSIS — I872 Venous insufficiency (chronic) (peripheral): Secondary | ICD-10-CM | POA: Diagnosis present

## 2024-02-04 NOTE — Progress Notes (Signed)
 Vascular and Vein Specialist of [place]  Patient name: Ruth Stephenson MRN: [id number] DOB: 03-28-1951 Sex: female   REASON FOR VISIT:    Follow up  HISOTRY OF PRESENT ILLNESS:    Ruth Stephenson is a 73 y.o. female with class IV venous insufficiency bilaterally.  She has undergone successful endovenous laser ablation of the right saphenous vein on 11/29/2023.  Her right leg has had significant improvement.  She continues to have issues with swelling in her left leg.  She has worn her compression and kept her leg elevated while she was walking on the right leg.  She is continue to have issues and is considering having this evaluated.   PAST MEDICAL HISTORY:   Past Medical History:  Diagnosis Date   Angio-edema    Breast cancer (HCC)    Cancer (HCC)    Cancer of right colon (HCC) 10/24/2012   Discussed with h/o- no need for further f/u (08-2020)     Cirrhosis of liver (HCC)    had a liver u/s in 2014 that showed that she was cirrhotic.   Hepatitis C    treated (Harvoni ) 2015   History of COVID-19 06/03/2020   HIV (human immunodeficiency virus infection) (HCC)    Peripheral vascular disease (HCC)    Pneumonia 03/15/2021   in hospital 02-2021 with Pneumococcal pna and non-adherence to meds.   Polysubstance abuse (HCC)    Tuberculosis    neg chest xray     FAMILY HISTORY:   Family History  Problem Relation Age of Onset   Angioedema Mother    Cancer Mother 66 - 38       unknown type   Asthma Brother    Throat cancer Maternal Uncle    Angioedema Maternal Grandmother     SOCIAL HISTORY:   Social History   Tobacco Use   Smoking status: Former    Current packs/day: 0.50    Average packs/day: 0.5 packs/day for 35.0 years (17.5 ttl pk-yrs)    Types: Cigarettes    Passive exposure: Past   Smokeless tobacco: Former  Substance Use Topics   Alcohol  use: Not Currently     ALLERGIES:   No Known Allergies   CURRENT MEDICATIONS:    Current Outpatient Medications  Medication Sig Dispense Refill   anastrozole  (ARIMIDEX ) 1 MG tablet Take 1 tablet (1 mg total) by mouth daily. 90 tablet 3   Cholecalciferol  (VITAMIN D3) 50 MCG (2000 UT) capsule Take 2,000 Units by mouth daily.     COLACE 2-IN-1 8.6-50 MG tablet Take 1 tablet by mouth daily.     DOVATO  50-300 MG tablet TAKE 1 TABLET BY MOUTH EVERY DAY 30 tablet 11   GINKGO BILOBA PLUS PO Take 120 mg by mouth daily.     Menatetrenone (VITAMIN K2) 100 MCG TABS Take 100 mg by mouth daily.     methadone  (DOLOPHINE ) 10 MG/5ML solution Take 23 mg by mouth in the morning.     Multiple Vitamins-Minerals (MULTIVITAMIN ADULTS 50+ PO) Take 1 tablet by mouth daily.     phenazopyridine  (PYRIDIUM ) 100 MG tablet Take 1 tablet (100 mg total) by mouth 3 (three) times daily as needed (urinary pain). 10 tablet 0   No current facility-administered medications for this visit.    REVIEW OF SYSTEMS:   [X]  denotes positive finding, [ ]  denotes negative finding Cardiac  Comments:  Chest pain or chest pressure:    Shortness of breath upon exertion:    Short of breath when  lying flat:    Irregular heart rhythm:        Vascular    Pain in calf, thigh, or hip brought on by ambulation:    Pain in feet at night that wakes you up from your sleep:     Blood clot in your veins:    Leg swelling:         Pulmonary    Oxygen at home:    Productive cough:     Wheezing:         Neurologic    Sudden weakness in arms or legs:     Sudden numbness in arms or legs:     Sudden onset of difficulty speaking or slurred speech:    Temporary loss of vision in one eye:     Problems with dizziness:         Gastrointestinal    Blood in stool:     Vomited blood:         Genitourinary    Burning when urinating:     Blood in urine:        Psychiatric    Major depression:         Hematologic    Bleeding problems:    Problems with blood clotting too easily:        Skin    Rashes or ulcers:         Constitutional    Fever or chills:      PHYSICAL EXAM:   Vitals:   02/04/24 1425  BP: (!) 104/59  Pulse: 62  Resp: (!) 22  Temp: 98.4 F (36.9 C)  TempSrc: Temporal  SpO2: 95%  Weight: 214 lb 12.8 oz (97.4 kg)  Height: 5' 3 (1.6 m)    GENERAL: The patient is a well-nourished female, in no acute distress. The vital signs are documented above. CARDIAC: There is a regular rate and rhythm.  VASCULAR: SonoSite was used to evaluate left saphenous vein which was markedly dilated with multiple branches.  She has severe hyperpigmentation in both lower extremities PULMONARY: Non-labored respirations MUSCULOSKELETAL: There are no major deformities or cyanosis. NEUROLOGIC: No focal weakness or paresthesias are detected. SKIN: There are no ulcers or rashes noted. PSYCHIATRIC: The patient has a normal affect.  STUDIES:   I have reviewed the following: LEFT          Reflux NoRefluxReflux TimeDiameter cmsComments                          Yes                                   +--------------+---------+------+-----------+------------+--------+  CFV          no                                              +--------------+---------+------+-----------+------------+--------+  FV mid        no                                              +--------------+---------+------+-----------+------------+--------+  Popliteal    no                                              +--------------+---------+------+-----------+------------+--------+  GSV at [facility]    no                            0.75              +--------------+---------+------+-----------+------------+--------+  GSV prox thighno                            0.65              +--------------+---------+------+-----------+------------+--------+  GSV mid thigh           yes    >500 ms      0.67              +--------------+---------+------+-----------+------------+--------+  GSV dist thigh           yes    >500 ms      0.59              +--------------+---------+------+-----------+------------+--------+  GSV at knee             yes    >500 ms      0.43              +--------------+---------+------+-----------+------------+--------+  GSV prox calf           yes    >500 ms      0.45              +--------------+---------+------+-----------+------------+--------+  SSV at SP[facility]    no                            0.06    NWV       +--------------+---------+------+-----------+------------+--------+       MEDICAL ISSUES:   CEAP classIV, left leg: The patient had an excellent result with her right leg.  She is now 1 to address her left leg.  Severe reflux was noted in the saphenous vein with large diameters on ultrasound.  The vein was even larger on my evaluation with SonoSite.  I think she would benefit from laser ablation of the left saphenous vein.  I will work on getting this scheduled in the near future.  She is HIV positive    Malvina Serene CLORE, MD, FACS [facility] and Vein Specialists of [facility] [phone] Pager [phone]

## 2024-02-06 ENCOUNTER — Other Ambulatory Visit: Payer: Medicare HMO

## 2024-02-11 ENCOUNTER — Ambulatory Visit

## 2024-02-11 ENCOUNTER — Ambulatory Visit: Payer: Self-pay

## 2024-02-11 VITALS — BP 101/46 | HR 64 | Temp 98.1°F | Ht 63.0 in | Wt 214.2 lb

## 2024-02-11 DIAGNOSIS — R600 Localized edema: Secondary | ICD-10-CM | POA: Diagnosis not present

## 2024-02-11 MED ORDER — PREGABALIN 50 MG PO CAPS: 50.0000 mg | ORAL_CAPSULE | Freq: Two times a day (BID) | ORAL | 0 refills | Status: DC

## 2024-02-11 NOTE — Patient Instructions (Addendum)
 Thank you, [name], for allowing us  to provide your care today. Today we discussed . . .  > Pain in your legs       - I have sent a course of Lyrica  50mg  to your pharmacy. You can take that once or twice per day. Please put the gauze on your legs and keep your wound dry.  I have ordered the following labs for you:  Lab Orders  No laboratory test(s) ordered today      Referrals ordered today:   Referral Orders  No referral(s) requested today      Follow up: Tomorrow with Dr. Eben   Remember:  Should you have any questions or concerns please call the internal medicine clinic at [phone].     Melvenia Morrison, [facility]

## 2024-02-11 NOTE — Telephone Encounter (Signed)
 FYI Only or Action Required?: FYI only for provider.  Patient was last seen in primary care on 06/05/2023 by Eben Reyes BROCKS, MD.  Called Nurse Triage reporting Leg Swelling.  Symptoms began ongoing problem.  Symptoms are: gradually worsening.  Triage Disposition: See HCP Within 4 Hours (Or PCP Triage)  Patient/caregiver understands and will follow disposition?: Yes       Copied from CRM #[id number]. Topic: Clinical - Red Word Triage >> Feb 11, 2024 10:47 AM Mercer PEDLAR wrote: Red Word that prompted transfer to Nurse Triage: Legs swelling, knee issues.         Reason for Disposition  SEVERE leg swelling (e.g., swelling extends above knee, entire leg is swollen, weeping fluid)  Answer Assessment - Initial Assessment Questions 1. ONSET: When did the swelling start? (e.g., minutes, hours, days)     Ongoing problem, worsening  2. LOCATION: What part of the leg is swollen?  Are both legs swollen or just one leg?     Bilateral legs  3. SEVERITY: How bad is the swelling? (e.g., localized; mild, moderate, severe)     Going above knees now  4. REDNESS: Is there redness or signs of infection?     Yes 5. PAIN: Is the swelling painful to touch? If Yes, ask: How painful is it?   (Scale 1-10; mild, moderate or severe)     10/10 6. FEVER: Do you have a fever? If Yes, ask: What is it, how was it measured, and when did it start?      No 7. CAUSE: What do you think is causing the leg swelling?     Has been ongoing since cancer surgery  9. RECURRENT SYMPTOM: Have you had leg swelling before? If Yes, ask: When was the last time? What happened that time?     Yes 10. OTHER SYMPTOMS: Do you have any other symptoms? (e.g., chest pain, difficulty breathing)       Lower back pain, dark urine color  Protocols used: Leg Swelling and Edema-A-AH

## 2024-02-11 NOTE — Assessment & Plan Note (Signed)
 Patient presents today with a primary concern of bilateral edema of her lower extremities.  This problem has been ongoing for several months now.  Her edema is about the same as it has been in the past.  She has some areas of weepiness around her right lower extremity which appear to be stemming from her venous insufficiency..  Does not look infected.  She has hyperpigmentation of both of her legs.  She has asymmetric swelling of her bilateral lower extremities.  Her left extremity is bigger than the right extremity but this is consistent with when she saw vascular surgery a week ago.  She had surgery on the right leg for varicose veins with reflux and this improved her swelling significantly.  Vascular surgery is planning on surgery of her left lower extremity.  She has no chest pain or shortness of breath to suggest a heart failure exacerbation.  She does have a history of cirrhosis and has previously been on furosemide .  It is likely that the weakness is stemming from her vascular insufficiency.  She also has associated pain which she describes as tingly and stinging pain.  She has a history of neuropathy documented in her chart.   - Prescribed Lyrica  50 mg twice daily. - Regarding wound care she was advised to keep the weepy areas dry with gauze pad.  She is provided with a gauze today.

## 2024-02-11 NOTE — Progress Notes (Signed)
 CC: Acute Concern of leg swelling and weepiness  HPI:  Ruth Stephenson is a 73 y.o. female with pertinent PMH of HIV, venous insufficiency who presents for chronic leg swelling and new weepiness. Please see problem based assessment and plan for further history.  Patient arrived almost 30 minutes late for her appointment.  She had a myriad of concerns and was asked to choose the most concerning one.  She chose her leg swelling.  I discussed that she could have more problems addressed at her follow-up visit with Dr. Eben tomorrow.  She would like to talk about her back pain and dark urine with him tomorrow.  This visit was limited by inability to perform labs.  Medications: Current Outpatient Medications  Medication Instructions   anastrozole  (ARIMIDEX ) 1 mg, Oral, Daily   COLACE 2-IN-1 8.6-50 MG tablet 1 tablet, Daily   DOVATO  50-300 MG tablet 1 tablet, Oral, Daily   GINKGO BILOBA PLUS PO 120 mg, Daily   methadone  (DOLOPHINE ) 23 mg, Every morning   Multiple Vitamins-Minerals (MULTIVITAMIN ADULTS 50+ PO) 1 tablet, Daily   phenazopyridine  (PYRIDIUM ) 100 mg, Oral, 3 times daily PRN   Vitamin D3 2,000 Units, Daily   Vitamin K2 100 mg, Daily     Physical Exam:  Vitals:   02/11/24 1614 02/11/24 1617  BP: (!) 120/108 (!) 101/46  Pulse: 65 64  Temp: 98.1 F (36.7 C)   TempSrc: Oral   SpO2: 98%   Weight: 214 lb 3.2 oz (97.2 kg)   Height: 5' 3 (1.6 m)     Physical Exam Constitutional:      General: She is not in acute distress.    Appearance: She is not ill-appearing.  Cardiovascular:     Rate and Rhythm: Normal rate and regular rhythm.     Heart sounds: Murmur heard.     No friction rub. No gallop.  Pulmonary:     Effort: No respiratory distress.     Breath sounds: Normal breath sounds.  Musculoskeletal:     Comments: Swelling of legs that is greater on the left than the right. Area of rash over the right lower extremity with some serous fluid drainage. Area is not  raised.   Hyperpigmented lower extremities.  No erythema or warmth present.  Palpable pulses bilaterally  Neurological:     Mental Status: She is alert.       Assessment & Plan:   Assessment & Plan Bilateral edema of lower extremity Patient presents today with a primary concern of bilateral edema of her lower extremities.  This problem has been ongoing for several months now.  Her edema is about the same as it has been in the past.  She has some areas of weepiness around her right lower extremity which appear to be stemming from her venous insufficiency..  Does not look infected.  She has hyperpigmentation of both of her legs.  She has asymmetric swelling of her bilateral lower extremities.  Her left extremity is bigger than the right extremity but this is consistent with when she saw vascular surgery a week ago.  She had surgery on the right leg for varicose veins with reflux and this improved her swelling significantly.  Vascular surgery is planning on surgery of her left lower extremity.  She has no chest pain or shortness of breath to suggest a heart failure exacerbation.  She does have a history of cirrhosis and has previously been on furosemide .  It is likely that the weakness is stemming  from her vascular insufficiency.  She also has associated pain which she describes as tingly and stinging pain.  She has a history of neuropathy documented in her chart.   - Prescribed Lyrica  50 mg twice daily. - Regarding wound care she was advised to keep the weepy areas dry with gauze pad.  She is provided with a gauze today.  This patient checked in nearly 30 minutes late for her appointment.  She was told that she would only be able to address 1 concern today.  She wanted to talk about her dark urine and lower back pain as well today but this was deferred due to time constraints and the lab being closed.  She was scheduled for a follow-up with Dr. Eben to discuss her HIV, dark urine, and back  pain tomorrow.  At that time I would recommend urinalysis and CMP to assess electrolyte abnormalities as well as any low albumin that may be causing her swelling.  No orders of the defined types were placed in this encounter.    Patient seen with Dr. Jone Dauphin  Melvenia Morrison, MD [facility] Internal [facility] Resident PGY-1 Clinic Phone: [phone] Please contact the on call pager at [phone] for any urgent or emergent needs.

## 2024-02-12 ENCOUNTER — Other Ambulatory Visit (HOSPITAL_COMMUNITY)
Admission: RE | Admit: 2024-02-12 | Discharge: 2024-02-12 | Disposition: A | Discharge status: Home / Self Care | Source: Ambulatory Visit | Attending: Infectious Diseases | Admitting: Infectious Diseases

## 2024-02-12 ENCOUNTER — Ambulatory Visit (INDEPENDENT_AMBULATORY_CARE_PROVIDER_SITE_OTHER): Admitting: Infectious Diseases

## 2024-02-12 VITALS — BP 110/57 | HR 56 | Temp 98.4°F | Ht 62.0 in | Wt 215.6 lb

## 2024-02-12 DIAGNOSIS — Z113 Encounter for screening for infections with a predominantly sexual mode of transmission: Secondary | ICD-10-CM

## 2024-02-12 DIAGNOSIS — R600 Localized edema: Secondary | ICD-10-CM | POA: Diagnosis not present

## 2024-02-12 DIAGNOSIS — C50412 Malignant neoplasm of upper-outer quadrant of left female breast: Secondary | ICD-10-CM

## 2024-02-12 DIAGNOSIS — Z79899 Other long term (current) drug therapy: Secondary | ICD-10-CM | POA: Diagnosis not present

## 2024-02-12 DIAGNOSIS — B20 Human immunodeficiency virus [HIV] disease: Secondary | ICD-10-CM

## 2024-02-12 NOTE — Progress Notes (Signed)
 Subjective:    Patient ID: Ruth Stephenson, female  DOB: 10/08/1950, 73 y.o.        MRN: [id number]   HPI 73 yo F with HIV+ since 1989, on genvoya --> Symtuza . (Genotype 2014 naive)--> dovato  (11-2021).  She also has a hx of Hep C treated (Harvoni ) 2015. 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  In 08-2012 she had surgery for colon cancer (II).  She had CT abd 04-2017: Cirrhotic morphology of the liver. Hyperdense focus within the anterior left hepatic lobe ; when clinically feasible, evaluation with liver MRI is suggested, preferably as an outpatient when the patient is able to breath hold.   Her Hep C RNA was (-) (11-2021).    Got section 8 housing. On disability.    She had abn mamogram 02-2023.  She underwent lumpectomy 03-2023:  BREAST, LEFT, LUMPECTOMY:  - Invasive ductal carcinoma, 1.8 cm, grade 2  - Ductal carcinoma in situ: Present, intermediate grade, solid and  cribriform type  ER+PR+ Her2- She has had oncology f/u and has a navigator.  Has gotten (prophylactic) XRT weekly for 5 weeks (completes 06-29-23). States she doesn't need CTX.  Adm with cellulitis R leg 04-13-23. 02-26-23 and 02-20-23 as well. No oral ulcers. Has felt feverish.    Has been taking her dovato .   Has been having trouble walking, feels like she has gained too much weight.  She had laser surgery on her R thigh due to swelling in her leg (vericose vein surgery).  She asks about wt loss medication. Has been trying to eat healthier.  Has had issues with urinary urgency.  Her leg pain improved last night with Lyrica  as given by IMTS.    HIV 1 RNA Quant  Date Value  12/16/2021 Not Detected Copies/mL  11/25/2021 33 Copies/mL (H)  09/16/2021 105 copies/mL (H)   HIV-1 RNA Viral Load (copies/mL)  Date Value  10/03/2022 <20   CD4 T Cell Abs (/uL)  Date Value  04/19/2023 215 (L)  10/03/2022 151 (L)  05/07/2022 143 (L)     Health Maintenance  Topic Date Due  . COVID-19 Vaccine  (1) Never done  . Zoster Vaccines- Shingrix (1 of 2) Never done  . DEXA SCAN  Never done  . INFLUENZA VACCINE  01/25/2024  . Colonoscopy  06/12/2024 (Originally 09/21/2022)  . Medicare Annual Wellness (AWV)  06/12/2024  . MAMMOGRAM  03/14/2025  . DTaP/Tdap/Td (2 - Td or Tdap) 02/02/2032  . Pneumococcal Vaccine: 50+ Years  Completed  . Hepatitis C Screening  Completed  . HPV VACCINES  Aged Out  . Meningococcal B Vaccine  Aged Out  . Pneumococcal Vaccine  Discontinued      Review of Systems  Constitutional:  Negative for chills and fever.  Cardiovascular:  Positive for leg swelling.  Genitourinary:  Positive for urgency. Negative for dysuria.  Musculoskeletal:  Positive for back pain.    Please see HPI. All other systems reviewed and negative.     Objective:  Physical Exam Constitutional:      General: She is not in acute distress.    Appearance: Normal appearance. She is obese. She is not ill-appearing.  HENT:     Mouth/Throat:     Mouth: Mucous membranes are moist.     Pharynx: No oropharyngeal exudate.  Eyes:     Extraocular Movements: Extraocular movements intact.     Pupils: Pupils are equal, round, and reactive to light.  Cardiovascular:  Rate and Rhythm: Normal rate and regular rhythm.  Pulmonary:     Effort: Pulmonary effort is normal.     Breath sounds: Normal breath sounds.  Abdominal:     General: Bowel sounds are normal. There is no distension.     Palpations: Abdomen is soft.     Tenderness: There is no abdominal tenderness.  Musculoskeletal:     Cervical back: Normal range of motion and neck supple.     Right lower leg: Edema present.     Left lower leg: Edema present.  Neurological:     Mental Status: She is alert.  Psychiatric:        Mood and Affect: Mood is anxious.           Assessment & Plan:

## 2024-02-12 NOTE — Assessment & Plan Note (Signed)
 Will continue her dovato  Will see if she is undetectable so we could change her to cabaneuva.  Needs her HIV labs Needs liver u/s to f/u her cirrhosis Needs repeat Hep C RNA Will see her back in 3-4 months.

## 2024-02-12 NOTE — Assessment & Plan Note (Signed)
 States she is cured and has rung the bell Appreciate their f/u

## 2024-02-12 NOTE — Progress Notes (Signed)
 [facility] Attending  I was physically present during the key portions of the resident provided service and participated in the medical decision making of patient's management care. I reviewed pertinent patient test results.  The assessment, diagnosis, and plan were formulated together and I agree with the documentation in the resident's note.  Jone Dauphin MD

## 2024-02-12 NOTE — Assessment & Plan Note (Signed)
 Appreciate IMTS help Her u/s was (-) for dvt 02-04-24 on L.  Appreciate vasc eval.

## 2024-02-13 ENCOUNTER — Telehealth: Payer: Self-pay | Admitting: *Deleted

## 2024-02-13 LAB — URINE CYTOLOGY ANCILLARY ONLY
Chlamydia: NEGATIVE
Comment: NEGATIVE
Comment: NORMAL
Neisseria Gonorrhea: NEGATIVE

## 2024-02-13 LAB — T-HELPER CELLS (CD4) COUNT (NOT AT ARMC)
CD4 % Helper T Cell: 24 % — ABNORMAL LOW (ref 33–65)
CD4 T Cell Abs: 142 /uL — ABNORMAL LOW (ref 400–1790)

## 2024-02-13 LAB — HCV RNA QUANT

## 2024-02-13 NOTE — Telephone Encounter (Signed)
 Abnormal lab results received  from yesterday's lab draw on patient.  Will forward to Dr. Eben for immediate review.

## 2024-02-14 LAB — CBC
Hematocrit: 39.9 % (ref 34.0–46.6)
Hemoglobin: 13.2 g/dL (ref 11.1–15.9)
MCH: 34.3 pg — ABNORMAL HIGH (ref 26.6–33.0)
MCHC: 33.1 g/dL (ref 31.5–35.7)
MCV: 104 fL — ABNORMAL HIGH (ref 79–97)
Platelets: 79 x10E3/uL — CL (ref 150–450)
RBC: 3.85 x10E6/uL (ref 3.77–5.28)
RDW: 12.6 % (ref 11.7–15.4)
WBC: 1.5 x10E3/uL — CL (ref 3.4–10.8)

## 2024-02-14 LAB — RPR: RPR Ser Ql: NONREACTIVE

## 2024-02-14 LAB — LIPID PANEL
Chol/HDL Ratio: 2 ratio (ref 0.0–4.4)
Cholesterol, Total: 143 mg/dL (ref 100–199)
HDL: 71 mg/dL (ref >39)
LDL Chol Calc (NIH): 58 mg/dL (ref 0–99)
Triglycerides: 70 mg/dL (ref 0–149)
VLDL Cholesterol Cal: 14 mg/dL (ref 5–40)

## 2024-02-14 LAB — COMPREHENSIVE METABOLIC PANEL WITH GFR
ALT: 14 IU/L (ref 0–32)
AST: 28 IU/L (ref 0–40)
Albumin: 3.5 g/dL — ABNORMAL LOW (ref 3.8–4.8)
Alkaline Phosphatase: 88 IU/L (ref 44–121)
BUN/Creatinine Ratio: 13 (ref 12–28)
BUN: 9 mg/dL (ref 8–27)
Bilirubin Total: 0.9 mg/dL (ref 0.0–1.2)
CO2: 26 mmol/L (ref 20–29)
Calcium: 9.2 mg/dL (ref 8.7–10.3)
Chloride: 104 mmol/L (ref 96–106)
Creatinine, Ser: 0.72 mg/dL (ref 0.57–1.00)
Globulin, Total: 3.1 g/dL (ref 1.5–4.5)
Glucose: 186 mg/dL — ABNORMAL HIGH (ref 70–99)
Potassium: 4.3 mmol/L (ref 3.5–5.2)
Sodium: 142 mmol/L (ref 134–144)
Total Protein: 6.6 g/dL (ref 6.0–8.5)
eGFR: 89 mL/min/1.73 (ref >59)

## 2024-02-14 LAB — HIV-1 RNA QUANT-NO REFLEX-BLD: HIV-1 RNA Viral Load: <20 {copies}/mL

## 2024-02-14 LAB — HCV RNA QUANT: Hepatitis C Quantitation: NOT DETECTED [IU]/mL

## 2024-02-27 ENCOUNTER — Telehealth: Payer: Self-pay | Admitting: *Deleted

## 2024-02-27 NOTE — Telephone Encounter (Unsigned)
 Copied from CRM [id number]. Topic: Clinical - Lab/Test Results >> Feb 26, 2024  4:46 PM Graeme ORN wrote: Reason for CRM: Patient called for lab results. States she is unable to access my chart and has not received a call. She would like a call back. Clinic currently closed. Thank You

## 2024-02-28 NOTE — Telephone Encounter (Signed)
 I'm falling apart over here..myalgias equilibrium is off, feel like I have weights on my legs... can't hold my urine anymore.... back hurts so bad that I am crying in my sleep...  We reviewed her labs I asked her to come to clinic so we can set up further screening (CT vs MRI of spine?) (I can hear her ambulating in her house in the background of the call without issue) She has u/s and elastography later this month to f/u her Hep C/cirrhosis  She will call clinic to schedule appt

## 2024-03-12 ENCOUNTER — Ambulatory Visit (INDEPENDENT_AMBULATORY_CARE_PROVIDER_SITE_OTHER): Admitting: Infectious Diseases

## 2024-03-12 VITALS — BP 127/69 | HR 59 | Temp 98.0°F | Ht 62.0 in | Wt 218.0 lb

## 2024-03-12 DIAGNOSIS — K7469 Other cirrhosis of liver: Secondary | ICD-10-CM

## 2024-03-12 DIAGNOSIS — R29898 Other symptoms and signs involving the musculoskeletal system: Secondary | ICD-10-CM

## 2024-03-12 DIAGNOSIS — R600 Localized edema: Secondary | ICD-10-CM

## 2024-03-12 NOTE — Addendum Note (Signed)
 Addended by: Myiah Petkus C on: 03/12/2024 02:17 PM   Modules accepted: Orders

## 2024-03-12 NOTE — Addendum Note (Signed)
 Addended by: Kamela Blansett C on: 03/12/2024 03:06 PM   Modules accepted: Orders

## 2024-03-12 NOTE — Progress Notes (Signed)
 Subjective:    Patient ID: Ruth Stephenson, female  DOB: 12/04/1950, 73 y.o.        MRN: [id number]  HPI 73 yo F with HIV+ since 1989, on genvoya --> Symtuza . (Genotype 2014 naive)--> dovato  (11-2021).  She also has a hx of Hep C treated (Harvoni ) 2015. 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  In 08-2012 she had surgery for colon cancer (II).  She had CT abd 04-2017: Cirrhotic morphology of the liver. Hyperdense focus within the anterior left hepatic lobe ; when clinically feasible, evaluation with liver MRI is suggested, preferably as an outpatient when the patient is able to breath hold.   Her Hep C RNA was (-) (11-2021 and 01-2024).    Got section 8 housing. On disability.    She had abn mamogram 02-2023.  She underwent lumpectomy 03-2023:  BREAST, LEFT, LUMPECTOMY:  - Invasive ductal carcinoma, 1.8 cm, grade 2  - Ductal carcinoma in situ: Present, intermediate grade, solid and  cribriform type  ER+PR+ Her2- She states she is cleared and has rung the bell   Has been taking her dovato .    Has been having trouble walking, feels like she has gained too much weight.  She had laser surgery on her R thigh due to swelling in her leg (vericose vein surgery).  She asks about wt loss medication. Has been trying to eat healthier.  Has had issues with urinary urgency.  Her leg pain improved last night with Lyrica  as given by IMTS.      She is concerned with her worsening ability to walk. She feels like she has weight in her legs.  Has also severe pain across the back. Throbbing.  She has noticed edema around her eyes as well.  Has gained 5-10# this year.  Last TSH 2023, 2022 normal.  Has had some constipation. She took enema this am with relief.  Has noticed more edema. Urinary urgency.   Worried she broke her toe after injuring on her bed post.    HIV 1 RNA Quant  Date Value  12/16/2021 Not Detected Copies/mL  11/25/2021 33 Copies/mL (H)  09/16/2021 105  copies/mL (H)   HIV-1 RNA Viral Load (copies/mL)  Date Value  02/12/2024 <20  10/03/2022 <20   CD4 T Cell Abs (/uL)  Date Value  02/12/2024 142 (L)  04/19/2023 215 (L)  10/03/2022 151 (L)     Health Maintenance  Topic Date Due  . COVID-19 Vaccine (1) Never done  . Zoster Vaccines- Shingrix (1 of 2) Never done  . DEXA SCAN  Never done  . Influenza Vaccine  01/25/2024  . Colonoscopy  06/12/2024 (Originally 09/21/2022)  . Medicare Annual Wellness (AWV)  06/12/2024  . DTaP/Tdap/Td (2 - Td or Tdap) 02/02/2032  . Pneumococcal Vaccine: 50+ Years  Completed  . Hepatitis C Screening  Completed  . HPV VACCINES  Aged Out  . Meningococcal B Vaccine  Aged Out  . Mammogram  Discontinued      Review of Systems  Constitutional:  Negative for weight loss.  Respiratory:  Negative for cough and shortness of breath.        No DOE  Cardiovascular:  Positive for leg swelling. Negative for chest pain.  Gastrointestinal:  Positive for constipation. Negative for diarrhea.  Genitourinary:  Positive for urgency. Negative for dysuria.    Please see HPI. All other systems reviewed and negative.     Objective:  Physical Exam Vitals reviewed.  Constitutional:      Appearance: She is obese.  HENT:     Mouth/Throat:     Mouth: Mucous membranes are moist.     Pharynx: No oropharyngeal exudate.  Eyes:     Extraocular Movements: Extraocular movements intact.     Pupils: Pupils are equal, round, and reactive to light.  Cardiovascular:     Rate and Rhythm: Normal rate and regular rhythm.     Heart sounds: Murmur heard.  Pulmonary:     Effort: Pulmonary effort is normal.     Breath sounds: Decreased air movement present. Examination of the right-lower field reveals decreased breath sounds. Examination of the left-lower field reveals decreased breath sounds. Decreased breath sounds present.  Abdominal:     General: Abdomen is flat. Bowel sounds are normal.     Palpations: Abdomen is soft.   Musculoskeletal:     Right lower leg: Edema present.     Left lower leg: Edema present.  Neurological:     General: No focal deficit present.     Mental Status: She is alert and oriented to person, place, and time.  Psychiatric:        Mood and Affect: Mood normal.           Assessment & Plan:

## 2024-03-12 NOTE — Assessment & Plan Note (Addendum)
 This seems to be more functional- limited by her edema.  Her distal and proximal strength is 5/5. Will send her for le dopppler as well as TTE.  She states she is urinating frequently and does not need (want) diuretic.  If she worsens, I asked to call if she has worsening and I will see her ASAP Will see her back in 1 month.

## 2024-03-12 NOTE — Assessment & Plan Note (Signed)
 Will get her in for MRI of liver

## 2024-03-13 ENCOUNTER — Telehealth: Payer: Self-pay | Admitting: *Deleted

## 2024-03-13 LAB — CBC
Hematocrit: 38.9 % (ref 34.0–46.6)
Hemoglobin: 12.9 g/dL (ref 11.1–15.9)
MCH: 33.9 pg — ABNORMAL HIGH (ref 26.6–33.0)
MCHC: 33.2 g/dL (ref 31.5–35.7)
MCV: 102 fL — ABNORMAL HIGH (ref 79–97)
Platelets: 77 x10E3/uL — CL (ref 150–450)
RBC: 3.8 x10E6/uL (ref 3.77–5.28)
RDW: 12.7 % (ref 11.7–15.4)
WBC: 2 x10E3/uL — CL (ref 3.4–10.8)

## 2024-03-13 LAB — COMPREHENSIVE METABOLIC PANEL WITH GFR
ALT: 14 IU/L (ref 0–32)
AST: 28 IU/L (ref 0–40)
Albumin: 3.4 g/dL — ABNORMAL LOW (ref 3.8–4.8)
Alkaline Phosphatase: 82 IU/L (ref 49–135)
BUN/Creatinine Ratio: 14 (ref 12–28)
BUN: 10 mg/dL (ref 8–27)
Bilirubin Total: 0.9 mg/dL (ref 0.0–1.2)
CO2: 27 mmol/L (ref 20–29)
Calcium: 9.2 mg/dL (ref 8.7–10.3)
Chloride: 105 mmol/L (ref 96–106)
Creatinine, Ser: 0.73 mg/dL (ref 0.57–1.00)
Globulin, Total: 3.4 g/dL (ref 1.5–4.5)
Glucose: 92 mg/dL (ref 70–99)
Potassium: 4.3 mmol/L (ref 3.5–5.2)
Sodium: 144 mmol/L (ref 134–144)
Total Protein: 6.8 g/dL (ref 6.0–8.5)
eGFR: 88 mL/min/1.73 (ref >59)

## 2024-03-13 LAB — BRAIN NATRIURETIC PEPTIDE: BNP: 221.9 pg/mL — ABNORMAL HIGH (ref 0.0–100.0)

## 2024-03-13 LAB — TSH: TSH: 1.84 u[IU]/mL (ref 0.450–4.500)

## 2024-03-18 ENCOUNTER — Ambulatory Visit (HOSPITAL_COMMUNITY)
Admission: RE | Admit: 2024-03-18 | Discharge: 2024-03-18 | Disposition: A | Discharge status: Home / Self Care | Source: Ambulatory Visit | Attending: Vascular Surgery | Admitting: Vascular Surgery

## 2024-03-18 ENCOUNTER — Ambulatory Visit

## 2024-03-18 DIAGNOSIS — R29898 Other symptoms and signs involving the musculoskeletal system: Secondary | ICD-10-CM | POA: Insufficient documentation

## 2024-03-18 NOTE — Telephone Encounter (Signed)
 Called and left vm- asked pt to call back to iMTS clinic  pt's dvt study was negative.

## 2024-03-19 ENCOUNTER — Ambulatory Visit (HOSPITAL_COMMUNITY): Admission: RE | Admit: 2024-03-19 | Source: Ambulatory Visit

## 2024-03-27 ENCOUNTER — Encounter

## 2024-04-02 ENCOUNTER — Encounter

## 2024-04-09 ENCOUNTER — Ambulatory Visit (HOSPITAL_COMMUNITY)
Admission: RE | Admit: 2024-04-09 | Discharge: 2024-04-09 | Disposition: A | Source: Ambulatory Visit | Attending: Infectious Diseases | Admitting: Infectious Diseases

## 2024-04-09 ENCOUNTER — Ambulatory Visit (HOSPITAL_COMMUNITY)
Admission: RE | Admit: 2024-04-09 | Discharge: 2024-04-09 | Disposition: A | Discharge status: Home / Self Care | Source: Ambulatory Visit | Attending: Infectious Diseases | Admitting: Infectious Diseases

## 2024-04-09 DIAGNOSIS — K7469 Other cirrhosis of liver: Secondary | ICD-10-CM | POA: Insufficient documentation

## 2024-04-09 DIAGNOSIS — B20 Human immunodeficiency virus [HIV] disease: Secondary | ICD-10-CM | POA: Diagnosis present

## 2024-04-09 MED ORDER — GADOBUTROL 1 MMOL/ML IV SOLN
9.8000 mL | Freq: Once | INTRAVENOUS | Status: AC | PRN
Start: 2024-04-09 — End: 2024-04-09
  Administered 2024-04-09: 9.8 mL via INTRAVENOUS

## 2024-04-15 ENCOUNTER — Encounter: Admitting: Infectious Diseases

## 2024-04-16 ENCOUNTER — Ambulatory Visit (HOSPITAL_COMMUNITY): Admission: RE | Admit: 2024-04-16 | Source: Ambulatory Visit

## 2024-04-16 ENCOUNTER — Telehealth (HOSPITAL_COMMUNITY): Payer: Self-pay | Admitting: Infectious Diseases

## 2024-04-16 NOTE — Telephone Encounter (Signed)
 Patient NO SHOWED the scheduled echocardiogram for 04/16/24 ordered by Dr. Eben. No follow up will be made to reschedule echocardiogram due to NO SHOW RATE of 38%. Order will be removed from the echo WQ.

## 2024-04-22 ENCOUNTER — Encounter: Admitting: Infectious Diseases

## 2024-05-27 ENCOUNTER — Telehealth: Payer: Self-pay | Admitting: *Deleted

## 2024-05-27 ENCOUNTER — Other Ambulatory Visit: Payer: Self-pay

## 2024-05-27 ENCOUNTER — Telehealth: Payer: Self-pay

## 2024-05-27 ENCOUNTER — Ambulatory Visit: Payer: Self-pay

## 2024-05-27 MED ORDER — ANASTROZOLE 1 MG PO TABS: 1.0000 mg | ORAL_TABLET | Freq: Every day | ORAL | 3 refills | Status: AC

## 2024-05-27 NOTE — Telephone Encounter (Signed)
 FYI Only or Action Required?: Action required by provider: request for appointment. Also pt would like provider to explain findings on lab work and imaging  Patient was last seen in primary care on 03/12/2024 by Eben Reyes BROCKS, MD.  Called Nurse Triage reporting Knee Pain.10/10 - cannot walk.  Symptoms began Since cancer surgery.  Interventions attempted: OTC medications: pain medication.  Symptoms are: rapidly worsening.  Triage Disposition: See HCP Within 4 Hours (Or PCP Triage)  Patient/caregiver understands and will follow disposition?: Unsure- Pt may go to ED this evening for pain control.                       Copied from CRM [id number]. Topic: Clinical - Red Word Triage >> May 27, 2024  4:27 PM Alfonso ORN wrote: Red Word that prompted transfer to Nurse Triage: every night  wake up crying be in so much pain ,having pain in her legs and her knee caps back and front , are most  painful rate severe  pain 10 worst than 10 rated   almost confine to her bed , patient can barely walk using a cane Reason for Disposition  [1] SEVERE pain (e.g., excruciating, unable to walk) AND [2] not improved after 2 hours of pain medicine  Answer Assessment - Initial Assessment Questions 1. LOCATION and RADIATION: Where is the pain located?      Both legs 2. QUALITY: What does the pain feel like?  (e.g., sharp, dull, aching, burning)     Cannot walk 3. SEVERITY: How bad is the pain? What does it keep you from doing?   (Scale 1-10; or mild, moderate, severe)     10/10 4. ONSET: When did the pain start? Does it come and go, or is it there all the time?     1 year  Protocols used: Knee Pain-A-AH

## 2024-05-27 NOTE — Telephone Encounter (Signed)
 Rx refill request received from [facility] for Anastrozole . Pt has not been scheduled for her f/u with Dr Loretha which is required for continuity of care. She was to see MD in November of this year but was not scheduled. She also cancelled her MM appt, stating she didn't think she needed mammograms anymore since she had a lumpex and was taking anastrozole . Educated pt on need for ongoing dx scans and she was agreeable. Pt was given number to call [facility] to schedule her MM. She knows to call us  back once scheduled so we can schedule MD f/u. She agreed to all of the above for us  to refill her rx. Sent per MD.

## 2024-05-27 NOTE — Telephone Encounter (Signed)
 Pt returned call and is scheduled for her MM 06/27/24. She will f/u with MD 07/04/24 at 1400.

## 2024-05-27 NOTE — Telephone Encounter (Signed)
 Followup call to schedule laser ablation of L GSV before end of 2025.  Ruth Stephenson states she is having pain in right knee and right toes and difficulty walking.  Recommended that she call her PCP and oncologist to evaluate and make referrals.  Ruth Stephenson states she will call back to schedule her left leg vein procedure after she resolves the issue with her right leg.

## 2024-05-28 ENCOUNTER — Other Ambulatory Visit (HOSPITAL_COMMUNITY)
Admission: RE | Admit: 2024-05-28 | Discharge: 2024-05-28 | Disposition: A | Source: Ambulatory Visit | Attending: Infectious Diseases | Admitting: Infectious Diseases

## 2024-05-28 ENCOUNTER — Other Ambulatory Visit: Payer: Self-pay

## 2024-05-28 ENCOUNTER — Ambulatory Visit: Admitting: Infectious Diseases

## 2024-05-28 ENCOUNTER — Telehealth: Payer: Self-pay | Admitting: Infectious Diseases

## 2024-05-28 VITALS — BP 104/66 | HR 61 | Temp 98.7°F | Resp 28 | Ht 63.5 in | Wt 211.7 lb

## 2024-05-28 DIAGNOSIS — C50412 Malignant neoplasm of upper-outer quadrant of left female breast: Secondary | ICD-10-CM

## 2024-05-28 DIAGNOSIS — K7469 Other cirrhosis of liver: Secondary | ICD-10-CM

## 2024-05-28 DIAGNOSIS — B20 Human immunodeficiency virus [HIV] disease: Secondary | ICD-10-CM

## 2024-05-28 DIAGNOSIS — E669 Obesity, unspecified: Secondary | ICD-10-CM | POA: Diagnosis not present

## 2024-05-28 DIAGNOSIS — Z113 Encounter for screening for infections with a predominantly sexual mode of transmission: Secondary | ICD-10-CM | POA: Diagnosis present

## 2024-05-28 DIAGNOSIS — Z17411 Hormone receptor positive with human epidermal growth factor receptor 2 negative status: Secondary | ICD-10-CM

## 2024-05-28 DIAGNOSIS — R600 Localized edema: Secondary | ICD-10-CM

## 2024-05-28 DIAGNOSIS — Z79899 Other long term (current) drug therapy: Secondary | ICD-10-CM

## 2024-05-28 DIAGNOSIS — F1129 Opioid dependence with unspecified opioid-induced disorder: Secondary | ICD-10-CM

## 2024-05-28 NOTE — Telephone Encounter (Signed)
 RTC to patient. Patient states is still in pain and will only see Dr. Eben when offered an appointment with 1 of the Residents this afternoon. Patient stated also that she has not heard about her labs.  Patient was transferred to the [facility] for an appointment.

## 2024-05-28 NOTE — Assessment & Plan Note (Signed)
 Will continue dovato  and recheck her labs today.  She has been adherent.  Hold on starting bactrim  til we have results.  Does not want flu or covid shot.  Rtc in 1 month.

## 2024-05-28 NOTE — Telephone Encounter (Signed)
 Called and left VM, pt no-showed her appt.  I reviewed her labs which are essentially unchanged (leukopenia and thrombocytopenia, both non-critical) Her MR liver shows cirrhosis without focal lesion or ascites. Asked her to call and set up f/u appt

## 2024-05-28 NOTE — Assessment & Plan Note (Signed)
 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

## 2024-05-28 NOTE — Assessment & Plan Note (Signed)
 Seems to be stable Will continue surveillance.

## 2024-05-28 NOTE — Progress Notes (Unsigned)
 Subjective:    Patient ID: Ruth Stephenson, female  DOB: [birth year], 73 y.o.        MRN: [id number]   HPI 73 yo F with HIV+ since 1989, on genvoya --> Symtuza . (Genotype 2014 naive)--> dovato  (11-2021).  She also has a hx of Hep C treated (Harvoni ) 2015. 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  In 08-2012 she had surgery for colon cancer (II).  She had CT abd 04-2017: Cirrhotic morphology of the liver. Hyperdense focus within the anterior left hepatic lobe. (03-2024) liver MRI- cirrhotic liver, no lesion, no ascites. .   Her Hep C RNA was (-) (11-2021 and 01-2024).    Got section 8 housing. On disability.    She had abn mamogram 02-2023.  She underwent lumpectomy 03-2023:  BREAST, LEFT, LUMPECTOMY:  - Invasive ductal carcinoma, 1.8 cm, grade 2  - Ductal carcinoma in situ: Present, intermediate grade, solid and  cribriform type  ER+PR+ Her2- She states she is cleared and has rung the bell   Has been taking her dovato .    Now walking with cane. Can't take a bath, can't bend her legs high enough to get into tub.  Describes her pain as 10/10. Starts in knees and radiates into toes. Ache like tooth aches. Wakes her up from sleep.  Feels like her knee ligaments are no longer connected. Can't stand up straight, has to walk with knees bent.  No hx of injury ot her knees.  Noted her knees, thighs got big after her breast cancer surgery.       HIV 1 RNA Quant  Date Value  12/16/2021 Not Detected Copies/mL  11/25/2021 33 Copies/mL (H)  09/16/2021 105 copies/mL (H)   HIV-1 RNA Viral Load (copies/mL)  Date Value  02/12/2024 <20  10/03/2022 <20   CD4 T Cell Abs (/uL)  Date Value  02/12/2024 142 (L)  04/19/2023 215 (L)  10/03/2022 151 (L)     Health Maintenance  Topic Date Due  . COVID-19 Vaccine (1) Never done  . Zoster Vaccines- Shingrix (1 of 2) Never done  . Bone Density Scan  Never done  . Influenza Vaccine  01/25/2024  . Mammogram  03/14/2024   . Medicare Annual Wellness (AWV)  06/12/2024  . Colonoscopy  06/12/2024 (Originally 09/21/2022)  . DTaP/Tdap/Td (2 - Td or Tdap) 02/02/2032  . Pneumococcal Vaccine: 50+ Years  Completed  . Hepatitis C Screening  Completed  . Meningococcal B Vaccine  Aged Out      Review of Systems  Constitutional:  Negative for chills, fever and weight loss.  Respiratory:  Positive for shortness of breath and wheezing.   Cardiovascular:  Positive for leg swelling. Negative for chest pain.  Gastrointestinal:  Negative for constipation and diarrhea.  Genitourinary:  Negative for dysuria.  Musculoskeletal:  Positive for joint pain (knee pain).    Please see HPI. All other systems reviewed and negative.     Objective:  Physical Exam Vitals reviewed.  Constitutional:      Appearance: Normal appearance. She is obese.  HENT:     Mouth/Throat:     Mouth: Mucous membranes are moist.     Pharynx: No oropharyngeal exudate.  Eyes:     Extraocular Movements: Extraocular movements intact.     Pupils: Pupils are equal, round, and reactive to light.  Cardiovascular:     Rate and Rhythm: Normal rate and regular rhythm.  Pulmonary:     Effort: Pulmonary effort  is normal.     Breath sounds: Decreased air movement present.  Abdominal:     General: Bowel sounds are normal. There is no distension.     Palpations: Abdomen is soft.     Tenderness: There is no abdominal tenderness.  Musculoskeletal:        General: Swelling present.     Cervical back: Normal range of motion and neck supple.     Right lower leg: Edema present.     Left lower leg: Edema present.  Neurological:     General: No focal deficit present.     Mental Status: She is alert.  Psychiatric:        Mood and Affect: Mood is depressed.     Comments: Tearful, concerned that her cancer is back.            Assessment & Plan:

## 2024-05-28 NOTE — Assessment & Plan Note (Signed)
 She has f/u with onc Jan 2026.

## 2024-05-28 NOTE — Assessment & Plan Note (Addendum)
 She needs to f/u with her vascular surgeon. Has appt for Jan 2/3 or 2/4 for onc and vasc.  u/s normal 02-2024.  Will repeat she has dimpling on her ant calves.  She needs to f/u her TTE Will repeat her labs, CXR

## 2024-05-28 NOTE — Assessment & Plan Note (Signed)
 Will defer wegovy/ozempeic at this point, get her through her current issues first.

## 2024-05-29 ENCOUNTER — Telehealth: Payer: Self-pay | Admitting: *Deleted

## 2024-05-29 LAB — T-HELPER CELLS (CD4) COUNT (NOT AT ARMC)
CD4 % Helper T Cell: 24 % — ABNORMAL LOW (ref 33–65)
CD4 T Cell Abs: 192 /uL — ABNORMAL LOW (ref 400–1790)

## 2024-05-29 LAB — URINE CYTOLOGY ANCILLARY ONLY
Chlamydia: NEGATIVE
Comment: NEGATIVE
Comment: NORMAL
Neisseria Gonorrhea: NEGATIVE

## 2024-05-29 NOTE — Telephone Encounter (Signed)
 Left VM with pt that her test so far have been normal/baseline.  She needs to f/u with her remaining tests- CXR, knee films, TTE.  Please assign her a PCP in the clinic  Thanks jeff

## 2024-05-29 NOTE — Telephone Encounter (Signed)
 Message sent to Dr. Eben about results of patient labs drawn on yesterday.

## 2024-05-30 LAB — SYPHILIS: RPR W/REFLEX TO RPR TITER AND TREPONEMAL ANTIBODIES, TRADITIONAL SCREENING AND DIAGNOSIS ALGORITHM: RPR Ser Ql: NONREACTIVE

## 2024-05-30 LAB — LIPID PANEL
Chol/HDL Ratio: 1.7 ratio (ref 0.0–4.4)
Cholesterol, Total: 143 mg/dL (ref 100–199)
HDL: 83 mg/dL (ref >39)
LDL Chol Calc (NIH): 49 mg/dL (ref 0–99)
Triglycerides: 49 mg/dL (ref 0–149)
VLDL Cholesterol Cal: 11 mg/dL (ref 5–40)

## 2024-05-30 LAB — CBC
Hematocrit: 41.8 % (ref 34.0–46.6)
Hemoglobin: 13.7 g/dL (ref 11.1–15.9)
MCH: 33.5 pg — ABNORMAL HIGH (ref 26.6–33.0)
MCHC: 32.8 g/dL (ref 31.5–35.7)
MCV: 102 fL — ABNORMAL HIGH (ref 79–97)
Platelets: 88 x10E3/uL — CL (ref 150–450)
RBC: 4.09 x10E6/uL (ref 3.77–5.28)
RDW: 12.3 % (ref 11.7–15.4)
WBC: 2 x10E3/uL — CL (ref 3.4–10.8)

## 2024-05-30 LAB — BRAIN NATRIURETIC PEPTIDE: BNP: 82.1 pg/mL (ref 0.0–100.0)

## 2024-05-30 LAB — COMPREHENSIVE METABOLIC PANEL WITH GFR
ALT: 15 IU/L (ref 0–32)
AST: 29 IU/L (ref 0–40)
Albumin: 3.6 g/dL — ABNORMAL LOW (ref 3.8–4.8)
Alkaline Phosphatase: 83 IU/L (ref 49–135)
BUN/Creatinine Ratio: 13 (ref 12–28)
BUN: 11 mg/dL (ref 8–27)
Bilirubin Total: 1.1 mg/dL (ref 0.0–1.2)
CO2: 27 mmol/L (ref 20–29)
Calcium: 9.1 mg/dL (ref 8.7–10.3)
Chloride: 105 mmol/L (ref 96–106)
Creatinine, Ser: 0.82 mg/dL (ref 0.57–1.00)
Globulin, Total: 3.5 g/dL (ref 1.5–4.5)
Glucose: 89 mg/dL (ref 70–99)
Potassium: 3.9 mmol/L (ref 3.5–5.2)
Sodium: 145 mmol/L — ABNORMAL HIGH (ref 134–144)
Total Protein: 7.1 g/dL (ref 6.0–8.5)
eGFR: 76 mL/min/1.73 (ref >59)

## 2024-05-30 LAB — HIV-1 RNA QUANT-NO REFLEX-BLD
HIV-1 RNA Viral Load Log: 1.954 {Log_copies}/mL
HIV-1 RNA Viral Load: 90 {copies}/mL

## 2024-06-12 ENCOUNTER — Ambulatory Visit (HOSPITAL_COMMUNITY): Admission: RE | Admit: 2024-06-12

## 2024-06-18 ENCOUNTER — Ambulatory Visit (HOSPITAL_COMMUNITY): Admission: RE | Admit: 2024-06-18 | Source: Ambulatory Visit

## 2024-06-27 ENCOUNTER — Encounter

## 2024-07-02 ENCOUNTER — Ambulatory Visit

## 2024-07-02 ENCOUNTER — Telehealth: Payer: Self-pay | Admitting: Hematology and Oncology

## 2024-07-02 ENCOUNTER — Other Ambulatory Visit (HOSPITAL_COMMUNITY): Payer: Self-pay

## 2024-07-02 ENCOUNTER — Inpatient Hospital Stay: Admitting: Hematology and Oncology

## 2024-07-02 NOTE — Telephone Encounter (Signed)
 I spoke with patient as she called in to reschedule MD appointment from 07/02/2024 to 07/04/2024.

## 2024-07-04 ENCOUNTER — Inpatient Hospital Stay: Attending: Hematology and Oncology | Admitting: Hematology and Oncology

## 2024-07-11 ENCOUNTER — Encounter (HOSPITAL_COMMUNITY)

## 2024-07-11 ENCOUNTER — Ambulatory Visit (HOSPITAL_COMMUNITY): Admission: RE | Admit: 2024-07-11 | Source: Ambulatory Visit

## 2024-07-15 NOTE — Telephone Encounter (Signed)
 Message sent directly to [facility] in the [facility] Echo Department.  The pt will need to contact [facility] as she has Cancelled or No showed 3 appts since it has been over 30 days with no response from the patient.   Name: Marc, Sivertsen MRN: [id number]  Date: 04/16/2024 Status: No Show   Name: Maydell, Knoebel MRN: [id number]  Date: 06/12/2024 Status: Can  Time: 3:00 PM     Name: Joylene, Wescott MRN: [id number]  Date: 06/18/2024 Status: No Show  Time: 2:00 PM     Name: Milayah, Krell MRN: [id number]  Date: 07/11/2024 Status: No Show  Time: 11:00 AM     Copied from CRM #[id number]. Topic: Referral - Question >> Jul 11, 2024  3:11 PM DeAngela L wrote: Reason for CRM: Ms Naomie Gavel calling to ask about echocardiogram referral for the patient and would like to ask why the request is closed and she states they tried to schedule the patient a few times and she cancelled    [phone] Dorothy phone num >> Jul 15, 2024 11:33 AM Chilon B wrote: A message has been sent to Naomie in the [facility] Echo Department about resch.

## 2024-07-24 ENCOUNTER — Emergency Department (HOSPITAL_COMMUNITY)
Admission: EM | Admit: 2024-07-24 | Discharge: 2024-07-24 | Disposition: A | Discharge status: Home / Self Care | Attending: Emergency Medicine | Admitting: Emergency Medicine

## 2024-07-24 ENCOUNTER — Other Ambulatory Visit: Payer: Self-pay

## 2024-07-24 ENCOUNTER — Emergency Department (HOSPITAL_COMMUNITY)

## 2024-07-24 ENCOUNTER — Encounter (HOSPITAL_COMMUNITY): Payer: Self-pay | Admitting: *Deleted

## 2024-07-24 DIAGNOSIS — Z21 Asymptomatic human immunodeficiency virus [HIV] infection status: Secondary | ICD-10-CM | POA: Insufficient documentation

## 2024-07-24 DIAGNOSIS — M25561 Pain in right knee: Secondary | ICD-10-CM | POA: Diagnosis present

## 2024-07-24 DIAGNOSIS — R600 Localized edema: Secondary | ICD-10-CM

## 2024-07-24 DIAGNOSIS — D696 Thrombocytopenia, unspecified: Secondary | ICD-10-CM | POA: Insufficient documentation

## 2024-07-24 DIAGNOSIS — M170 Bilateral primary osteoarthritis of knee: Secondary | ICD-10-CM | POA: Insufficient documentation

## 2024-07-24 DIAGNOSIS — D72819 Decreased white blood cell count, unspecified: Secondary | ICD-10-CM | POA: Diagnosis not present

## 2024-07-24 LAB — COMPREHENSIVE METABOLIC PANEL WITH GFR
ALT: 32 U/L (ref 0–44)
AST: 37 U/L (ref 15–41)
Albumin: 3.2 g/dL — ABNORMAL LOW (ref 3.5–5.0)
Alkaline Phosphatase: 83 U/L (ref 38–126)
Anion gap: 7 (ref 5–15)
BUN: 14 mg/dL (ref 8–23)
CO2: 27 mmol/L (ref 22–32)
Calcium: 9.3 mg/dL (ref 8.9–10.3)
Chloride: 110 mmol/L (ref 98–111)
Creatinine, Ser: 0.75 mg/dL (ref 0.44–1.00)
GFR, Estimated: >60 mL/min
Glucose, Bld: 83 mg/dL (ref 70–99)
Potassium: 4.7 mmol/L (ref 3.5–5.1)
Sodium: 144 mmol/L (ref 135–145)
Total Bilirubin: 0.8 mg/dL (ref 0.0–1.2)
Total Protein: 6.2 g/dL — ABNORMAL LOW (ref 6.5–8.1)

## 2024-07-24 LAB — CBC
HCT: 39.1 % (ref 36.0–46.0)
Hemoglobin: 12.5 g/dL (ref 12.0–15.0)
MCH: 34.2 pg — ABNORMAL HIGH (ref 26.0–34.0)
MCHC: 32 g/dL (ref 30.0–36.0)
MCV: 106.8 fL — ABNORMAL HIGH (ref 80.0–100.0)
Platelets: 84 10*3/uL — ABNORMAL LOW (ref 150–400)
RBC: 3.66 MIL/uL — ABNORMAL LOW (ref 3.87–5.11)
RDW: 13.2 % (ref 11.5–15.5)
WBC: 3 10*3/uL — ABNORMAL LOW (ref 4.0–10.5)
nRBC: 0 % (ref 0.0–0.2)

## 2024-07-24 MED ORDER — OXYCODONE-ACETAMINOPHEN 5-325 MG PO TABS
1.0000 | ORAL_TABLET | Freq: Once | ORAL | Status: AC
  Administered 2024-07-24: 1 via ORAL
  Filled 2024-07-24: qty 1

## 2024-07-24 MED ORDER — HYDROCODONE-ACETAMINOPHEN 5-325 MG PO TABS: 1.0000 | ORAL_TABLET | Freq: Four times a day (QID) | ORAL | 0 refills | Status: AC | PRN

## 2024-07-24 MED ORDER — MORPHINE SULFATE (PF) 4 MG/ML IV SOLN: 4.0000 mg | Freq: Once | INTRAVENOUS | Status: DC

## 2024-07-24 MED ORDER — MUPIROCIN CALCIUM 2 % EX CREA: 1.0000 | TOPICAL_CREAM | Freq: Two times a day (BID) | CUTANEOUS | 0 refills | Status: AC

## 2024-07-24 MED ORDER — FUROSEMIDE 20 MG PO TABS: 20.0000 mg | ORAL_TABLET | Freq: Every day | ORAL | 0 refills | Status: AC

## 2024-07-24 NOTE — ED Triage Notes (Addendum)
 BIB EMS after multiple falls and pain in knee last 3 days, Since receiving injection in L Knee she has been falling more frequently. She started shots in Dec, Repeated a week and half ago, immediately noted pain. 144/76-98-98% RA-24  Pt has darkening of lower legs weeping since ? Vein injection. Unclear as to type of injections she has been receiving. Edema noted to bil knees and below knees.

## 2024-07-24 NOTE — ED Provider Notes (Signed)
 " [place] EMERGENCY DEPARTMENT AT [facility] Provider Note   CSN: [id number] Arrival date & time: 07/24/24  1659     Patient presents with: Fall and Knee Pain   Ruth Stephenson is a 74 y.o. female.    Fall  Knee Pain    Patient has a history of HIV, hepatitis, substance use disorder, cirrhosis, peripheral vascular disease.  Patient states she has been having trouble with her knees.  She has been seeing an orthopedic doctor and started getting shots in December.  Patient had another knee injection about a week and a half ago.  Patient states she had more pain after that.  Patient states she has been having pain in her knees and when she tries to walk she feels like her knee gives out on her.  She did have more falls noted in the last few days.  Patient denies injuring herself since the fall.  She does not have any fevers or chills.  No weakness in her extremities.  No back pain.  Prior to Admission medications  Medication Sig Start Date End Date Taking? Authorizing Provider  furosemide  (LASIX ) 20 MG tablet Take 1 tablet (20 mg total) by mouth daily. 07/24/24 07/31/24 Yes Randol Simmonds, MD  HYDROcodone -acetaminophen  (NORCO/VICODIN) 5-325 MG tablet Take 1 tablet by mouth every 6 (six) hours as needed. 07/24/24  Yes Randol Simmonds, MD  mupirocin  cream (BACTROBAN ) 2 % Apply 1 Application topically 2 (two) times daily. 07/24/24  Yes Randol Simmonds, MD  anastrozole  (ARIMIDEX ) 1 MG tablet Take 1 tablet (1 mg total) by mouth daily. 05/27/24   Iruku, Praveena, MD  Cholecalciferol  (VITAMIN D3) 50 MCG (2000 UT) capsule Take 2,000 Units by mouth daily.    [provider]  DOVATO  50-300 MG tablet TAKE 1 TABLET BY MOUTH EVERY DAY 12/10/23   Eben Reyes BROCKS, MD  [facility] BILOBA PLUS PO Take 120 mg by mouth daily.    [provider]  Menatetrenone (VITAMIN K2) 100 MCG TABS Take 100 mg by mouth daily.    [provider]  methadone  (DOLOPHINE ) 10 MG/5ML solution Take 23 mg by  mouth in the morning.    [provider]  Multiple Vitamins-Minerals (MULTIVITAMIN ADULTS 50+ PO) Take 1 tablet by mouth daily.    [provider]  pregabalin  (LYRICA ) 50 MG capsule Take 1 capsule (50 mg total) by mouth 2 (two) times daily for 7 days. 02/11/24 02/18/24  Shawn Sick, MD    Allergies: Patient has no known allergies.    Review of Systems  Updated Vital Signs BP (!) 106/56 (BP Location: Right Arm)   Pulse (!) 50   Temp 98.1 F (36.7 C) (Oral)   Resp 18   Ht 1.6 m (5' 3)   Wt 83.9 kg   SpO2 93%   BMI 32.77 kg/m   Physical Exam Vitals and nursing note reviewed.  Constitutional:      Appearance: She is well-developed.     Comments: Elevated BMI  HENT:     Head: Normocephalic and atraumatic.     Right Ear: External ear normal.     Left Ear: External ear normal.  Eyes:     General: No scleral icterus.       Right eye: No discharge.        Left eye: No discharge.     Conjunctiva/sclera: Conjunctivae normal.  Neck:     Trachea: No tracheal deviation.  Cardiovascular:     Rate and Rhythm: Normal rate  and regular rhythm.  Pulmonary:     Effort: Pulmonary effort is normal. No respiratory distress.     Breath sounds: Normal breath sounds. No stridor. No wheezing or rales.  Abdominal:     General: Bowel sounds are normal. There is no distension.     Palpations: Abdomen is soft.     Tenderness: There is no abdominal tenderness. There is no guarding or rebound.  Musculoskeletal:        General: Tenderness present. No deformity.     Cervical back: Neck supple.     Right lower leg: Edema present.     Left lower leg: Edema present.     Comments: Tenderness palpation left knee, no erythema  Skin:    General: Skin is warm and dry.     Findings: No rash.  Neurological:     General: No focal deficit present.     Mental Status: She is alert.     Cranial Nerves: No cranial nerve deficit, dysarthria or facial asymmetry.     Sensory: No sensory  deficit.     Motor: No weakness, abnormal muscle tone or seizure activity.     Coordination: Coordination normal.     Comments: Equal grip strength bilaterally, normal plantarflexion strength bilaterally, normal sensation throughout  Psychiatric:        Mood and Affect: Mood normal.     (all labs ordered are listed, but only abnormal results are displayed) Labs Reviewed  CBC - Abnormal; Notable for the following components:      Result Value   WBC 3.0 (*)    RBC 3.66 (*)    MCV 106.8 (*)    MCH 34.2 (*)    Platelets 84 (*)    All other components within normal limits  COMPREHENSIVE METABOLIC PANEL WITH GFR - Abnormal; Notable for the following components:   Total Protein 6.2 (*)    Albumin 3.2 (*)    All other components within normal limits    EKG: None  Radiology: DG Knee Complete 4 Views Right Result Date: 07/24/2024 EXAM: 4 VIEW(S) XRAY OF THE RIGHT KNEE 07/24/2024 06:43:24 PM COMPARISON: None available. CLINICAL HISTORY: Multiple falls over the past 3 days with knee pain. FINDINGS: BONES AND JOINTS: No acute fracture. No significant joint effusion. Medial and lateral compartment joint space narrowing and marginal osteophyte formation. SOFT TISSUES: Unremarkable. IMPRESSION: 1. No acute osseous abnormality. 2. Moderate osteoarthritis with medial and lateral compartment joint space narrowing and marginal osteophyte formation. Electronically signed by: Oneil Devonshire MD 07/24/2024 07:14 PM EST RP Workstation: [id number]   DG Knee Complete 4 Views Left Result Date: 07/24/2024 EXAM: 4 VIEW(S) XRAY OF THE LEFT KNEE 07/24/2024 06:43:24 PM COMPARISON: None available. CLINICAL HISTORY: Pain; recent falls. Multiple falls over the past 3 days. FINDINGS: BONES AND JOINTS: No acute fracture. No significant joint effusion. Tricompartmental marginal osteophytes with moderate narrowing of the medial femorotibial joint compartment. Anterior patellar enthesophytes noted. SOFT TISSUES: Unremarkable.  IMPRESSION: 1. No acute osseous abnormality. 2. Tricompartmental osteoarthritis with moderate medial femorotibial joint space narrowing. Electronically signed by: Oneil Devonshire MD 07/24/2024 07:13 PM EST RP Workstation: [id number]     Procedures   Medications Ordered in the ED  morphine  (PF) 4 MG/ML injection 4 mg (has no administration in time range)  oxyCODONE -acetaminophen  (PERCOCET/ROXICET) 5-325 MG per tablet 1 tablet (1 tablet Oral Given 07/24/24 1824)    Clinical Course as of 07/24/24 2243  Thu Jul 24, 2024  1931 Osteoarthritis noted in both the left and  right knee [JK]  2228 Comprehensive metabolic panel(!) Metabolic panel normal with exception of low protein [JK]  2228 CBC(!) White blood cell count and platelet count decreased but similar to previous [JK]    Clinical Course User Index [JK] Randol Simmonds, MD                                 Medical Decision Making Amount and/or Complexity of Data Reviewed Labs: ordered. Decision-making details documented in ED Course. Radiology: ordered.  Risk Prescription drug management.   Patient presented with ED with complaints of pain in her knee.  Patient states she has been seeing orthopedic doctor.  She has been having injections in her knee.  Patient also has noticed some edema to both lower legs.  Patient does not have any complaints of back pain.  She does not have any focal weakness.  I do not suspect lumbar radiculopathy or myelopathy causing lower extremity weakness.    Patient's laboratory test did not show any findings of acute anemia.  No renal dysfunction.  I suspect patient's difficulty walking is related to her knee arthritis.  Her lower extremity edema has also contributing.  Will start the patient on outpatient diuretics.  Medications for pain.       Final diagnoses:  Peripheral edema    ED Discharge Orders          Ordered    mupirocin  cream (BACTROBAN ) 2 %  2 times daily        07/24/24 2237     HYDROcodone -acetaminophen  (NORCO/VICODIN) 5-325 MG tablet  Every 6 hours PRN        07/24/24 2237    For home use only DME 4 wheeled rolling walker with seat        07/24/24 2237    furosemide  (LASIX ) 20 MG tablet  Daily        07/24/24 2237               Randol Simmonds, MD 07/25/24 1030  "

## 2024-07-24 NOTE — Discharge Instructions (Addendum)
 Take the diuretic medication to help with your swelling.  Take the medications for pain and to help with your knee discomfort.  Use the walker to help get around.  Follow-up with your primary care and orthopedic doctor to be rechecked

## 2024-07-30 ENCOUNTER — Emergency Department (HOSPITAL_COMMUNITY)
Admission: EM | Admit: 2024-07-30 | Discharge: 2024-07-31 | Disposition: A | Source: Home / Self Care | Attending: Emergency Medicine | Admitting: Emergency Medicine

## 2024-07-30 ENCOUNTER — Encounter (HOSPITAL_COMMUNITY): Payer: Self-pay | Admitting: Pharmacy Technician

## 2024-07-30 ENCOUNTER — Emergency Department (HOSPITAL_COMMUNITY)

## 2024-07-30 ENCOUNTER — Other Ambulatory Visit: Payer: Self-pay

## 2024-07-30 DIAGNOSIS — R600 Localized edema: Secondary | ICD-10-CM

## 2024-07-30 DIAGNOSIS — L97511 Non-pressure chronic ulcer of other part of right foot limited to breakdown of skin: Secondary | ICD-10-CM

## 2024-07-30 LAB — CBC WITH DIFFERENTIAL/PLATELET
Abs Immature Granulocytes: 0.01 10*3/uL (ref 0.00–0.07)
Basophils Absolute: 0 10*3/uL (ref 0.0–0.1)
Basophils Relative: 0 %
Eosinophils Absolute: 0.1 10*3/uL (ref 0.0–0.5)
Eosinophils Relative: 2 %
HCT: 43.1 % (ref 36.0–46.0)
Hemoglobin: 14 g/dL (ref 12.0–15.0)
Immature Granulocytes: 0 %
Lymphocytes Relative: 31 %
Lymphs Abs: 0.9 10*3/uL (ref 0.7–4.0)
MCH: 34.4 pg — ABNORMAL HIGH (ref 26.0–34.0)
MCHC: 32.5 g/dL (ref 30.0–36.0)
MCV: 105.9 fL — ABNORMAL HIGH (ref 80.0–100.0)
Monocytes Absolute: 0.4 10*3/uL (ref 0.1–1.0)
Monocytes Relative: 15 %
Neutro Abs: 1.5 10*3/uL — ABNORMAL LOW (ref 1.7–7.7)
Neutrophils Relative %: 52 %
Platelets: 87 10*3/uL — ABNORMAL LOW (ref 150–400)
RBC: 4.07 MIL/uL (ref 3.87–5.11)
RDW: 13.4 % (ref 11.5–15.5)
WBC: 3 10*3/uL — ABNORMAL LOW (ref 4.0–10.5)
nRBC: 0 % (ref 0.0–0.2)

## 2024-07-30 LAB — COMPREHENSIVE METABOLIC PANEL WITH GFR
ALT: 28 U/L (ref 0–44)
AST: 48 U/L — ABNORMAL HIGH (ref 15–41)
Albumin: 3.4 g/dL — ABNORMAL LOW (ref 3.5–5.0)
Alkaline Phosphatase: 108 U/L (ref 38–126)
Anion gap: 8 (ref 5–15)
BUN: 15 mg/dL (ref 8–23)
CO2: 28 mmol/L (ref 22–32)
Calcium: 9.5 mg/dL (ref 8.9–10.3)
Chloride: 109 mmol/L (ref 98–111)
Creatinine, Ser: 0.75 mg/dL (ref 0.44–1.00)
GFR, Estimated: >60 mL/min
Glucose, Bld: 84 mg/dL (ref 70–99)
Potassium: 4.5 mmol/L (ref 3.5–5.1)
Sodium: 145 mmol/L (ref 135–145)
Total Bilirubin: 1.1 mg/dL (ref 0.0–1.2)
Total Protein: 6.8 g/dL (ref 6.5–8.1)

## 2024-07-30 LAB — I-STAT CG4 LACTIC ACID, ED: Lactic Acid, Venous: 1.6 mmol/L (ref 0.5–1.9)

## 2024-07-30 LAB — PRO BRAIN NATRIURETIC PEPTIDE: Pro Brain Natriuretic Peptide: 64.2 pg/mL

## 2024-07-30 NOTE — ED Triage Notes (Signed)
 Pt bib ems from home with with possible skin infection on legs and feet for the last week. Endorses pain. Pt has been putting neosporin on it. VSS.  136/72 HR 80 97% RA CBG 90

## 2024-07-30 NOTE — ED Provider Triage Note (Signed)
 Emergency Medicine Provider Triage Evaluation Note  Ruth Stephenson , a 74 y.o. female  was evaluated in triage.  Pt complains of right foot and great toe pain with redness concerning for infection.  Review of Systems  Positive: Right great toe redness and pain and swelling going to her foot.  No history of gout.  History of HIV/AIDS. Negative: No history of gout peripheral edema seems to be slightly improving with recent diuretic use.   Physical Exam  BP (!) 128/59 (BP Location: Right Arm)   Pulse 67   Temp 98.9 F (37.2 C) (Oral)   Resp 16   SpO2 97%  Gen:   Awake, no distress   Resp:  Normal effort  MSK:   Moves extremities without difficulty, peripheral edema bilaterally.  Tenderness swelling and redness to her right great toe with redness going towards the rest the foot Other:    Medical Decision Making  Medically screening exam initiated at 7:45 PM.  Appropriate orders placed.  Ruth Stephenson was informed that the remainder of the evaluation will be completed by another provider, this initial triage assessment does not replace that evaluation, and the importance of remaining in the ED until their evaluation is complete.  Ruth Stephenson is a 74 y.o. female with a past medical history significant for documented HIV/AIDS, previous breast cancer status post surgery, peripheral vascular disease, hepatitis C, cirrhosis, thoracic aortic aneurysm, GERD, and neuropathies who presents with right foot redness and pain concerning for infection.  Patient says that she is having pain in her right foot and right great toe and she thinks it is infected.  She says that she has been having pain and swelling in her legs recently and was started on diuretic and the edema seems to be improving for the last few days however she has had worsening pain in her right great toe.  She has no history of gout and there was no reported trauma.  She is a very small wound and is worried about infection.  She denies  systemic signs infection with no other fevers, chills, congestion, cough, nausea, vomiting, or other complaints.  She reports the pain is spreading up towards the ankle.  On exam in triage, she did not have tenderness in the calf or ankle but does have tenderness in the dorsum of the foot and redness and warmth in the right great toe.  Peripheral edema bilaterally which she subjectively says is improving.  I did feel a pulse distally.  There is a very small wound that is nearly pinprick size on the medial side of the right great toe.  Clinically given her HIV/AIDS and what appears to be infection I am concerned about osteomyelitis developing.  Will get an x-ray and get some labs.  Anticipate further assessment and management when she is seen by provider to determine disposition.    Jasean Ambrosia, Lonni PARAS, MD 07/30/24 [phone]

## 2024-07-31 MED ORDER — CEPHALEXIN 500 MG PO CAPS: 500.0000 mg | ORAL_CAPSULE | Freq: Four times a day (QID) | ORAL | 0 refills | Status: AC

## 2024-07-31 MED ORDER — PREGABALIN 50 MG PO CAPS: 50.0000 mg | ORAL_CAPSULE | Freq: Two times a day (BID) | ORAL | 0 refills | Status: AC

## 2024-07-31 MED ORDER — CEPHALEXIN 250 MG PO CAPS
500.0000 mg | ORAL_CAPSULE | Freq: Once | ORAL | Status: AC
  Administered 2024-07-31: 500 mg via ORAL
  Filled 2024-07-31: qty 2

## 2024-07-31 MED ORDER — HYDROCODONE-ACETAMINOPHEN 5-325 MG PO TABS
1.0000 | ORAL_TABLET | Freq: Once | ORAL | Status: AC
  Administered 2024-07-31: 1 via ORAL
  Filled 2024-07-31: qty 1

## 2024-07-31 NOTE — ED Provider Notes (Signed)
 " [facility] Provider Note   CSN: [id number] Arrival date & time: 07/30/24  1850     Patient presents with: No chief complaint on file.   Ruth Stephenson is a 74 y.o. female.   Ruth Stephenson is a 74 y.o. female with a history of HIV, hepatitis, breast cancer s/p resection, cirrhosis, peripheral vascular disease, who presents to the emergency department for evaluation of pain to the right great toe as well as bilateral lower extremity swelling.  Patient reports she has been having issues with lower leg swelling and pain for the last 5 to 6 months ever since her breast cancer surgery.  She has followed with multiple doctors including vascular and her primary care doctor for this as well as orthopedics but feels like she has not gotten relief for great answers about what is going on.  She reports that she was referred to physical therapy but did not go yesterday because she felt like she could not walk due to pain and swelling in her legs.  She does report subjectively swelling has gotten a little bit better but is overall still persistent.  She reports an aching pain in both legs.  She reports over the past few days she has noticed a small wound to the medial aspect of the right great toe with some surrounding erythema.  Has not noted any drainage from this area.  Reports that she has been walking with a cane for the last several months.  Patient has been seen multiple times for leg pain and edema.  The history is provided by the patient and medical records.       Prior to Admission medications  Medication Sig Start Date End Date Taking? Authorizing Provider  cephALEXin  (KEFLEX ) 500 MG capsule Take 1 capsule (500 mg total) by mouth 4 (four) times daily. 07/31/24  Yes Lenin Kuhnle F, PA-C  anastrozole  (ARIMIDEX ) 1 MG tablet Take 1 tablet (1 mg total) by mouth daily. 05/27/24   Iruku, Praveena, MD  Cholecalciferol  (VITAMIN D3) 50 MCG (2000 UT) capsule  Take 2,000 Units by mouth daily.    [provider]  DOVATO  50-300 MG tablet TAKE 1 TABLET BY MOUTH EVERY DAY 12/10/23   Eben Reyes BROCKS, MD  furosemide  (LASIX ) 20 MG tablet Take 1 tablet (20 mg total) by mouth daily. 07/24/24 07/31/24  Randol Simmonds, MD  [facility] BILOBA PLUS PO Take 120 mg by mouth daily.    [provider]  HYDROcodone -acetaminophen  (NORCO/VICODIN) 5-325 MG tablet Take 1 tablet by mouth every 6 (six) hours as needed. 07/24/24   Randol Simmonds, MD  Menatetrenone (VITAMIN K2) 100 MCG TABS Take 100 mg by mouth daily.    [provider]  methadone  (DOLOPHINE ) 10 MG/5ML solution Take 23 mg by mouth in the morning.    [provider]  Multiple Vitamins-Minerals (MULTIVITAMIN ADULTS 50+ PO) Take 1 tablet by mouth daily.    [provider]  mupirocin  cream (BACTROBAN ) 2 % Apply 1 Application topically 2 (two) times daily. 07/24/24   Randol Simmonds, MD  pregabalin  (LYRICA ) 50 MG capsule Take 1 capsule (50 mg total) by mouth 2 (two) times daily. 07/31/24 08/14/24  Micholas Drumwright F, PA-C    Allergies: Patient has no known allergies.    Review of Systems  Constitutional:  Negative for chills and fever.  Respiratory:  Negative for shortness of breath.   Cardiovascular:  Positive for leg swelling. Negative for chest pain.  Skin:  Positive for wound.    Updated Vital Signs BP 139/66 (BP Location: Right Arm)   Pulse 66   Temp (!) 97.3 F (36.3 C) (Temporal)   Resp 16   SpO2 100%   Physical Exam Vitals and nursing note reviewed.  Constitutional:      General: She is not in acute distress.    Appearance: Normal appearance. She is well-developed. She is not ill-appearing or diaphoretic.  HENT:     Head: Normocephalic and atraumatic.  Eyes:     General:        Right eye: No discharge.        Left eye: No discharge.  Cardiovascular:     Rate and Rhythm: Normal rate and regular rhythm.     Heart sounds: Normal heart sounds.  Pulmonary:     Effort:  Pulmonary effort is normal. No respiratory distress.     Breath sounds: Normal breath sounds.  Musculoskeletal:     Right lower leg: Edema present.     Left lower leg: Edema present.     Comments: Bilateral 1+ edema of the lower extremities, right great toe with a subcentimeter ulceration on the medial aspect with minimal surrounding erythema no purulent drainage or crepitus noted no significant erythema or pain extending back towards the proximal foot.  DP pulses 2+ in bilateral lower extremities.  Neurological:     Mental Status: She is alert and oriented to person, place, and time.     Coordination: Coordination normal.  Psychiatric:        Mood and Affect: Mood normal.        Behavior: Behavior normal.     (all labs ordered are listed, but only abnormal results are displayed) Labs Reviewed  CBC WITH DIFFERENTIAL/PLATELET - Abnormal; Notable for the following components:      Result Value   WBC 3.0 (*)    MCV 105.9 (*)    MCH 34.4 (*)    Platelets 87 (*)    Neutro Abs 1.5 (*)    All other components within normal limits  COMPREHENSIVE METABOLIC PANEL WITH GFR - Abnormal; Notable for the following components:   Albumin 3.4 (*)    AST 48 (*)    All other components within normal limits  PRO BRAIN NATRIURETIC PEPTIDE  I-STAT CG4 LACTIC ACID, ED    EKG: None  Radiology: DG Foot Complete Right Result Date: 07/30/2024 EXAM: 3 OR MORE VIEW(S) XRAY OF THE RIGHT FOOT 07/30/2024 09:34:58 PM COMPARISON: None available. CLINICAL HISTORY: Concern for right great toe infection in the setting of AIDS. Rule out osteomyelitis. FINDINGS: BONES AND JOINTS: No radiographic evidence of osteomyelitis. Mild great toe IP joint osteoarthritis. No acute fracture. No malalignment. SOFT TISSUES: Generalized soft tissue swelling in the forefoot and great toe. IMPRESSION: 1. No evidence of osteomyelitis. Electronically signed by: Norman Gatlin MD 07/30/2024 09:38 PM EST RP Workstation: [id number]      Procedures   Medications Ordered in the ED  cephALEXin  (KEFLEX ) capsule 500 mg (500 mg Oral Given 07/31/24 0900)  HYDROcodone -acetaminophen  (NORCO/VICODIN) 5-325 MG per tablet 1 tablet (1 tablet Oral Given 07/31/24 0900)                                    Medical Decision Making Risk Prescription drug management.   Patient presents with bilateral lower extremity swelling and a wound to the right great toe.  On arrival vitals are  stable and patient is in no acute distress.  Lower extremity edema seems to be chronic and slightly improved.  Workup today reassuring with normal proBNP and no significant electrolyte derangements or leukocytosis, lactic acid of 1.6.  X-ray of the right toe does not show subcutaneous gas or signs of osteomyelitis.  Given the wound and mild signs of surrounding infection will place patient on Keflex  for potential skin infection and I encouraged her to follow-up closely for wound check.  The patient and I had a long conversation giving ongoing issues with the leg pain and swelling which has been present for several months and she has been seen by multiple providers for.  Discussed that this is more of a chronic issue and that she is seeking out the right specialist to get this further evaluated.  Encouraged the use of compression stockings to help with swelling and continuing all medications.  Since surgery several months ago seem to be when all this started also discussed that deconditioning during recovery postsurgery could have led to some muscle weakness and encouraged her to attend physical therapy to help her get stronger and back to moving at her baseline.  At 1 point she was prescribed Lyrica  to try and help with her pain but does not remember refilling this prescription.  I have resent this medication and for the patient to try and encouraged her to follow-up closely with her primary care provider.     Final diagnoses:  Bilateral leg edema  Skin ulcer of  right great toe, limited to breakdown of skin [facility])    ED Discharge Orders          Ordered    pregabalin  (LYRICA ) 50 MG capsule  2 times daily        07/31/24 0908    cephALEXin  (KEFLEX ) 500 MG capsule  4 times daily        07/31/24 0908               Alva Larraine FALCON, PA-C 07/31/24 1556  "

## 2024-07-31 NOTE — Discharge Instructions (Signed)
 You have a small wound on your right great toe take antibiotics as prescribed for the next 5 days.  It looks like you were previously prescribed Lyrica  to help with pain in your legs we will try that again please follow-up closely with your regular doctor for reevaluation of this.  I encourage you to follow-up with physical therapy that you were set up for to help with leg pain and swelling as well.  You may have some deconditioning and muscle weakness after recovering from your surgery that is contributing.

## 2024-08-08 ENCOUNTER — Ambulatory Visit: Payer: Self-pay | Admitting: Family Medicine
